# Patient Record
Sex: Male | Born: 1945 | Race: White | Hispanic: No | Marital: Married | Smoking: Never smoker
Health system: Southern US, Community
[De-identification: ages and names within clinical notes are randomized; demographics above are authoritative.]

## PROBLEM LIST (undated history)

## (undated) DIAGNOSIS — E119 Type 2 diabetes mellitus without complications: Secondary | ICD-10-CM

## (undated) DIAGNOSIS — I714 Abdominal aortic aneurysm, without rupture, unspecified: Secondary | ICD-10-CM

## (undated) DIAGNOSIS — K219 Gastro-esophageal reflux disease without esophagitis: Secondary | ICD-10-CM

## (undated) DIAGNOSIS — I2510 Atherosclerotic heart disease of native coronary artery without angina pectoris: Secondary | ICD-10-CM

## (undated) DIAGNOSIS — I5042 Chronic combined systolic (congestive) and diastolic (congestive) heart failure: Secondary | ICD-10-CM

## (undated) DIAGNOSIS — I350 Nonrheumatic aortic (valve) stenosis: Secondary | ICD-10-CM

## (undated) DIAGNOSIS — E785 Hyperlipidemia, unspecified: Secondary | ICD-10-CM

## (undated) DIAGNOSIS — I639 Cerebral infarction, unspecified: Secondary | ICD-10-CM

## (undated) DIAGNOSIS — I10 Essential (primary) hypertension: Secondary | ICD-10-CM

## (undated) DIAGNOSIS — D696 Thrombocytopenia, unspecified: Secondary | ICD-10-CM

## (undated) DIAGNOSIS — I119 Hypertensive heart disease without heart failure: Secondary | ICD-10-CM

## (undated) HISTORY — DX: Essential (primary) hypertension: I10

## (undated) HISTORY — DX: Hypertensive heart disease without heart failure: I11.9

## (undated) HISTORY — DX: Cerebral infarction, unspecified: I63.9

## (undated) HISTORY — DX: Nonrheumatic aortic (valve) stenosis: I35.0

## (undated) HISTORY — PX: TONSILLECTOMY: SUR1361

## (undated) HISTORY — PX: CARDIAC CATHETERIZATION: SHX172

## (undated) HISTORY — DX: Hyperlipidemia, unspecified: E78.5

## (undated) HISTORY — PX: MOLE REMOVAL: SHX2046

## (undated) HISTORY — DX: Type 2 diabetes mellitus without complications: E11.9

---

## 1997-09-02 ENCOUNTER — Inpatient Hospital Stay (HOSPITAL_COMMUNITY): Admission: EM | Admit: 1997-09-02 | Discharge: 1997-09-02 | Payer: Self-pay | Admitting: Emergency Medicine

## 1998-01-24 ENCOUNTER — Emergency Department (HOSPITAL_COMMUNITY): Admission: EM | Admit: 1998-01-24 | Discharge: 1998-01-24 | Payer: Self-pay | Admitting: Emergency Medicine

## 1998-06-01 ENCOUNTER — Ambulatory Visit (HOSPITAL_COMMUNITY): Admission: RE | Admit: 1998-06-01 | Discharge: 1998-06-01 | Payer: Self-pay | Admitting: Gastroenterology

## 1998-08-17 ENCOUNTER — Encounter: Payer: Self-pay | Admitting: Emergency Medicine

## 1998-08-17 ENCOUNTER — Emergency Department (HOSPITAL_COMMUNITY): Admission: EM | Admit: 1998-08-17 | Discharge: 1998-08-17 | Payer: Self-pay | Admitting: Emergency Medicine

## 1999-04-10 ENCOUNTER — Emergency Department (HOSPITAL_COMMUNITY): Admission: EM | Admit: 1999-04-10 | Discharge: 1999-04-10 | Payer: Self-pay | Admitting: Emergency Medicine

## 1999-12-12 ENCOUNTER — Ambulatory Visit (HOSPITAL_COMMUNITY): Admission: RE | Admit: 1999-12-12 | Discharge: 1999-12-12 | Payer: Self-pay | Admitting: Gastroenterology

## 2000-08-24 ENCOUNTER — Ambulatory Visit (HOSPITAL_COMMUNITY): Admission: RE | Admit: 2000-08-24 | Discharge: 2000-08-24 | Payer: Self-pay | Admitting: Gastroenterology

## 2000-08-24 ENCOUNTER — Encounter: Payer: Self-pay | Admitting: Gastroenterology

## 2000-09-30 ENCOUNTER — Ambulatory Visit (HOSPITAL_COMMUNITY): Admission: RE | Admit: 2000-09-30 | Discharge: 2000-09-30 | Payer: Self-pay | Admitting: Gastroenterology

## 2001-09-13 ENCOUNTER — Ambulatory Visit (HOSPITAL_COMMUNITY): Admission: RE | Admit: 2001-09-13 | Discharge: 2001-09-13 | Payer: Self-pay | Admitting: Gastroenterology

## 2005-06-28 ENCOUNTER — Observation Stay (HOSPITAL_COMMUNITY): Admission: EM | Admit: 2005-06-28 | Discharge: 2005-06-29 | Payer: Self-pay | Admitting: Emergency Medicine

## 2011-07-29 DIAGNOSIS — E785 Hyperlipidemia, unspecified: Secondary | ICD-10-CM | POA: Diagnosis not present

## 2011-07-29 DIAGNOSIS — E1159 Type 2 diabetes mellitus with other circulatory complications: Secondary | ICD-10-CM | POA: Diagnosis not present

## 2011-07-29 DIAGNOSIS — Z125 Encounter for screening for malignant neoplasm of prostate: Secondary | ICD-10-CM | POA: Diagnosis not present

## 2011-07-29 DIAGNOSIS — I10 Essential (primary) hypertension: Secondary | ICD-10-CM | POA: Diagnosis not present

## 2011-08-05 DIAGNOSIS — I2510 Atherosclerotic heart disease of native coronary artery without angina pectoris: Secondary | ICD-10-CM | POA: Diagnosis not present

## 2011-08-05 DIAGNOSIS — Z0000 Encounter for general adult medical examination without abnormal findings: Secondary | ICD-10-CM | POA: Diagnosis not present

## 2011-08-05 DIAGNOSIS — Z125 Encounter for screening for malignant neoplasm of prostate: Secondary | ICD-10-CM | POA: Diagnosis not present

## 2011-08-05 DIAGNOSIS — I10 Essential (primary) hypertension: Secondary | ICD-10-CM | POA: Diagnosis not present

## 2011-08-05 DIAGNOSIS — E1159 Type 2 diabetes mellitus with other circulatory complications: Secondary | ICD-10-CM | POA: Diagnosis not present

## 2011-08-07 DIAGNOSIS — Z1212 Encounter for screening for malignant neoplasm of rectum: Secondary | ICD-10-CM | POA: Diagnosis not present

## 2011-12-10 DIAGNOSIS — I10 Essential (primary) hypertension: Secondary | ICD-10-CM | POA: Diagnosis not present

## 2011-12-10 DIAGNOSIS — I2510 Atherosclerotic heart disease of native coronary artery without angina pectoris: Secondary | ICD-10-CM | POA: Diagnosis not present

## 2011-12-10 DIAGNOSIS — E785 Hyperlipidemia, unspecified: Secondary | ICD-10-CM | POA: Diagnosis not present

## 2011-12-10 DIAGNOSIS — E1159 Type 2 diabetes mellitus with other circulatory complications: Secondary | ICD-10-CM | POA: Diagnosis not present

## 2012-02-17 DIAGNOSIS — C44319 Basal cell carcinoma of skin of other parts of face: Secondary | ICD-10-CM | POA: Diagnosis not present

## 2012-02-17 DIAGNOSIS — D239 Other benign neoplasm of skin, unspecified: Secondary | ICD-10-CM | POA: Diagnosis not present

## 2012-02-17 DIAGNOSIS — L821 Other seborrheic keratosis: Secondary | ICD-10-CM | POA: Diagnosis not present

## 2012-02-17 DIAGNOSIS — C4441 Basal cell carcinoma of skin of scalp and neck: Secondary | ICD-10-CM | POA: Diagnosis not present

## 2012-02-17 DIAGNOSIS — L219 Seborrheic dermatitis, unspecified: Secondary | ICD-10-CM | POA: Diagnosis not present

## 2012-02-17 DIAGNOSIS — D485 Neoplasm of uncertain behavior of skin: Secondary | ICD-10-CM | POA: Diagnosis not present

## 2012-03-22 DIAGNOSIS — C44319 Basal cell carcinoma of skin of other parts of face: Secondary | ICD-10-CM | POA: Diagnosis not present

## 2012-04-05 DIAGNOSIS — C4441 Basal cell carcinoma of skin of scalp and neck: Secondary | ICD-10-CM | POA: Diagnosis not present

## 2012-04-05 DIAGNOSIS — C4491 Basal cell carcinoma of skin, unspecified: Secondary | ICD-10-CM | POA: Diagnosis not present

## 2012-08-02 DIAGNOSIS — I10 Essential (primary) hypertension: Secondary | ICD-10-CM | POA: Diagnosis not present

## 2012-08-02 DIAGNOSIS — E1159 Type 2 diabetes mellitus with other circulatory complications: Secondary | ICD-10-CM | POA: Diagnosis not present

## 2012-08-02 DIAGNOSIS — E785 Hyperlipidemia, unspecified: Secondary | ICD-10-CM | POA: Diagnosis not present

## 2012-08-02 DIAGNOSIS — Z125 Encounter for screening for malignant neoplasm of prostate: Secondary | ICD-10-CM | POA: Diagnosis not present

## 2012-08-09 DIAGNOSIS — I10 Essential (primary) hypertension: Secondary | ICD-10-CM | POA: Diagnosis not present

## 2012-08-09 DIAGNOSIS — I2510 Atherosclerotic heart disease of native coronary artery without angina pectoris: Secondary | ICD-10-CM | POA: Diagnosis not present

## 2012-08-09 DIAGNOSIS — E1159 Type 2 diabetes mellitus with other circulatory complications: Secondary | ICD-10-CM | POA: Diagnosis not present

## 2012-08-09 DIAGNOSIS — E785 Hyperlipidemia, unspecified: Secondary | ICD-10-CM | POA: Diagnosis not present

## 2012-08-09 DIAGNOSIS — M1990 Unspecified osteoarthritis, unspecified site: Secondary | ICD-10-CM | POA: Diagnosis not present

## 2012-08-09 DIAGNOSIS — Z125 Encounter for screening for malignant neoplasm of prostate: Secondary | ICD-10-CM | POA: Diagnosis not present

## 2012-08-09 DIAGNOSIS — Z0000 Encounter for general adult medical examination without abnormal findings: Secondary | ICD-10-CM | POA: Diagnosis not present

## 2012-08-11 DIAGNOSIS — Z1212 Encounter for screening for malignant neoplasm of rectum: Secondary | ICD-10-CM | POA: Diagnosis not present

## 2013-02-07 DIAGNOSIS — I10 Essential (primary) hypertension: Secondary | ICD-10-CM | POA: Diagnosis not present

## 2013-02-07 DIAGNOSIS — M1990 Unspecified osteoarthritis, unspecified site: Secondary | ICD-10-CM | POA: Diagnosis not present

## 2013-02-07 DIAGNOSIS — Z23 Encounter for immunization: Secondary | ICD-10-CM | POA: Diagnosis not present

## 2013-02-07 DIAGNOSIS — Z1331 Encounter for screening for depression: Secondary | ICD-10-CM | POA: Diagnosis not present

## 2013-02-07 DIAGNOSIS — E785 Hyperlipidemia, unspecified: Secondary | ICD-10-CM | POA: Diagnosis not present

## 2013-02-07 DIAGNOSIS — I2510 Atherosclerotic heart disease of native coronary artery without angina pectoris: Secondary | ICD-10-CM | POA: Diagnosis not present

## 2013-02-07 DIAGNOSIS — Z6841 Body Mass Index (BMI) 40.0 and over, adult: Secondary | ICD-10-CM | POA: Diagnosis not present

## 2013-02-07 DIAGNOSIS — E1159 Type 2 diabetes mellitus with other circulatory complications: Secondary | ICD-10-CM | POA: Diagnosis not present

## 2013-05-09 ENCOUNTER — Other Ambulatory Visit: Payer: Self-pay | Admitting: Dermatology

## 2013-05-09 DIAGNOSIS — L851 Acquired keratosis [keratoderma] palmaris et plantaris: Secondary | ICD-10-CM | POA: Diagnosis not present

## 2013-05-09 DIAGNOSIS — D239 Other benign neoplasm of skin, unspecified: Secondary | ICD-10-CM | POA: Diagnosis not present

## 2013-05-09 DIAGNOSIS — L219 Seborrheic dermatitis, unspecified: Secondary | ICD-10-CM | POA: Diagnosis not present

## 2013-05-09 DIAGNOSIS — D485 Neoplasm of uncertain behavior of skin: Secondary | ICD-10-CM | POA: Diagnosis not present

## 2013-05-09 DIAGNOSIS — L570 Actinic keratosis: Secondary | ICD-10-CM | POA: Diagnosis not present

## 2013-05-09 DIAGNOSIS — Z85828 Personal history of other malignant neoplasm of skin: Secondary | ICD-10-CM | POA: Diagnosis not present

## 2013-10-10 DIAGNOSIS — I10 Essential (primary) hypertension: Secondary | ICD-10-CM | POA: Diagnosis not present

## 2013-10-10 DIAGNOSIS — Z125 Encounter for screening for malignant neoplasm of prostate: Secondary | ICD-10-CM | POA: Diagnosis not present

## 2013-10-10 DIAGNOSIS — E785 Hyperlipidemia, unspecified: Secondary | ICD-10-CM | POA: Diagnosis not present

## 2013-10-10 DIAGNOSIS — E119 Type 2 diabetes mellitus without complications: Secondary | ICD-10-CM | POA: Diagnosis not present

## 2013-10-17 DIAGNOSIS — I10 Essential (primary) hypertension: Secondary | ICD-10-CM | POA: Diagnosis not present

## 2013-10-17 DIAGNOSIS — E785 Hyperlipidemia, unspecified: Secondary | ICD-10-CM | POA: Diagnosis not present

## 2013-10-17 DIAGNOSIS — M1990 Unspecified osteoarthritis, unspecified site: Secondary | ICD-10-CM | POA: Diagnosis not present

## 2013-10-17 DIAGNOSIS — I2510 Atherosclerotic heart disease of native coronary artery without angina pectoris: Secondary | ICD-10-CM | POA: Diagnosis not present

## 2013-10-17 DIAGNOSIS — Z125 Encounter for screening for malignant neoplasm of prostate: Secondary | ICD-10-CM | POA: Diagnosis not present

## 2013-10-17 DIAGNOSIS — E1159 Type 2 diabetes mellitus with other circulatory complications: Secondary | ICD-10-CM | POA: Diagnosis not present

## 2013-10-17 DIAGNOSIS — K7689 Other specified diseases of liver: Secondary | ICD-10-CM | POA: Diagnosis not present

## 2013-10-17 DIAGNOSIS — Z0000 Encounter for general adult medical examination without abnormal findings: Secondary | ICD-10-CM | POA: Diagnosis not present

## 2013-10-18 DIAGNOSIS — Z1212 Encounter for screening for malignant neoplasm of rectum: Secondary | ICD-10-CM | POA: Diagnosis not present

## 2014-01-26 DIAGNOSIS — L678 Other hair color and hair shaft abnormalities: Secondary | ICD-10-CM | POA: Diagnosis not present

## 2014-01-26 DIAGNOSIS — Z6841 Body Mass Index (BMI) 40.0 and over, adult: Secondary | ICD-10-CM | POA: Diagnosis not present

## 2014-01-26 DIAGNOSIS — L738 Other specified follicular disorders: Secondary | ICD-10-CM | POA: Diagnosis not present

## 2014-01-26 DIAGNOSIS — I10 Essential (primary) hypertension: Secondary | ICD-10-CM | POA: Diagnosis not present

## 2014-01-26 DIAGNOSIS — E1159 Type 2 diabetes mellitus with other circulatory complications: Secondary | ICD-10-CM | POA: Diagnosis not present

## 2014-03-03 DIAGNOSIS — Z23 Encounter for immunization: Secondary | ICD-10-CM | POA: Diagnosis not present

## 2014-05-10 ENCOUNTER — Other Ambulatory Visit: Payer: Self-pay | Admitting: Dermatology

## 2014-05-10 DIAGNOSIS — D485 Neoplasm of uncertain behavior of skin: Secondary | ICD-10-CM | POA: Diagnosis not present

## 2014-05-10 DIAGNOSIS — C4441 Basal cell carcinoma of skin of scalp and neck: Secondary | ICD-10-CM | POA: Diagnosis not present

## 2014-05-10 DIAGNOSIS — Z85828 Personal history of other malignant neoplasm of skin: Secondary | ICD-10-CM | POA: Diagnosis not present

## 2014-05-10 DIAGNOSIS — D229 Melanocytic nevi, unspecified: Secondary | ICD-10-CM | POA: Diagnosis not present

## 2014-05-10 DIAGNOSIS — L570 Actinic keratosis: Secondary | ICD-10-CM | POA: Diagnosis not present

## 2014-05-10 DIAGNOSIS — L821 Other seborrheic keratosis: Secondary | ICD-10-CM | POA: Diagnosis not present

## 2014-05-10 DIAGNOSIS — L723 Sebaceous cyst: Secondary | ICD-10-CM | POA: Diagnosis not present

## 2014-05-10 DIAGNOSIS — Z86018 Personal history of other benign neoplasm: Secondary | ICD-10-CM | POA: Diagnosis not present

## 2014-05-15 DIAGNOSIS — E785 Hyperlipidemia, unspecified: Secondary | ICD-10-CM | POA: Diagnosis not present

## 2014-05-15 DIAGNOSIS — Z6841 Body Mass Index (BMI) 40.0 and over, adult: Secondary | ICD-10-CM | POA: Diagnosis not present

## 2014-05-15 DIAGNOSIS — Z1389 Encounter for screening for other disorder: Secondary | ICD-10-CM | POA: Diagnosis not present

## 2014-05-15 DIAGNOSIS — E1151 Type 2 diabetes mellitus with diabetic peripheral angiopathy without gangrene: Secondary | ICD-10-CM | POA: Diagnosis not present

## 2014-05-15 DIAGNOSIS — K760 Fatty (change of) liver, not elsewhere classified: Secondary | ICD-10-CM | POA: Diagnosis not present

## 2014-05-15 DIAGNOSIS — I10 Essential (primary) hypertension: Secondary | ICD-10-CM | POA: Diagnosis not present

## 2014-06-21 DIAGNOSIS — C4441 Basal cell carcinoma of skin of scalp and neck: Secondary | ICD-10-CM | POA: Diagnosis not present

## 2014-11-01 DIAGNOSIS — E785 Hyperlipidemia, unspecified: Secondary | ICD-10-CM | POA: Diagnosis not present

## 2014-11-01 DIAGNOSIS — E1151 Type 2 diabetes mellitus with diabetic peripheral angiopathy without gangrene: Secondary | ICD-10-CM | POA: Diagnosis not present

## 2014-11-01 DIAGNOSIS — I10 Essential (primary) hypertension: Secondary | ICD-10-CM | POA: Diagnosis not present

## 2014-11-01 DIAGNOSIS — Z125 Encounter for screening for malignant neoplasm of prostate: Secondary | ICD-10-CM | POA: Diagnosis not present

## 2014-11-08 DIAGNOSIS — I10 Essential (primary) hypertension: Secondary | ICD-10-CM | POA: Diagnosis not present

## 2014-11-08 DIAGNOSIS — E1151 Type 2 diabetes mellitus with diabetic peripheral angiopathy without gangrene: Secondary | ICD-10-CM | POA: Diagnosis not present

## 2014-11-08 DIAGNOSIS — M1990 Unspecified osteoarthritis, unspecified site: Secondary | ICD-10-CM | POA: Diagnosis not present

## 2014-11-08 DIAGNOSIS — Z6839 Body mass index (BMI) 39.0-39.9, adult: Secondary | ICD-10-CM | POA: Diagnosis not present

## 2014-11-08 DIAGNOSIS — I2510 Atherosclerotic heart disease of native coronary artery without angina pectoris: Secondary | ICD-10-CM | POA: Diagnosis not present

## 2014-11-08 DIAGNOSIS — E785 Hyperlipidemia, unspecified: Secondary | ICD-10-CM | POA: Diagnosis not present

## 2014-11-08 DIAGNOSIS — Z0000 Encounter for general adult medical examination without abnormal findings: Secondary | ICD-10-CM | POA: Diagnosis not present

## 2014-11-09 DIAGNOSIS — Z1212 Encounter for screening for malignant neoplasm of rectum: Secondary | ICD-10-CM | POA: Diagnosis not present

## 2014-12-20 DIAGNOSIS — C4441 Basal cell carcinoma of skin of scalp and neck: Secondary | ICD-10-CM | POA: Diagnosis not present

## 2014-12-20 DIAGNOSIS — D485 Neoplasm of uncertain behavior of skin: Secondary | ICD-10-CM | POA: Diagnosis not present

## 2014-12-20 DIAGNOSIS — C44311 Basal cell carcinoma of skin of nose: Secondary | ICD-10-CM | POA: Diagnosis not present

## 2014-12-20 DIAGNOSIS — L570 Actinic keratosis: Secondary | ICD-10-CM | POA: Diagnosis not present

## 2015-03-12 DIAGNOSIS — E1151 Type 2 diabetes mellitus with diabetic peripheral angiopathy without gangrene: Secondary | ICD-10-CM | POA: Diagnosis not present

## 2015-03-13 DIAGNOSIS — E119 Type 2 diabetes mellitus without complications: Secondary | ICD-10-CM | POA: Diagnosis not present

## 2015-03-21 DIAGNOSIS — I10 Essential (primary) hypertension: Secondary | ICD-10-CM | POA: Diagnosis not present

## 2015-03-21 DIAGNOSIS — Z6839 Body mass index (BMI) 39.0-39.9, adult: Secondary | ICD-10-CM | POA: Diagnosis not present

## 2015-03-21 DIAGNOSIS — M1990 Unspecified osteoarthritis, unspecified site: Secondary | ICD-10-CM | POA: Diagnosis not present

## 2015-03-21 DIAGNOSIS — Z23 Encounter for immunization: Secondary | ICD-10-CM | POA: Diagnosis not present

## 2015-03-21 DIAGNOSIS — N401 Enlarged prostate with lower urinary tract symptoms: Secondary | ICD-10-CM | POA: Diagnosis not present

## 2015-03-21 DIAGNOSIS — E1151 Type 2 diabetes mellitus with diabetic peripheral angiopathy without gangrene: Secondary | ICD-10-CM | POA: Diagnosis not present

## 2015-03-21 DIAGNOSIS — E784 Other hyperlipidemia: Secondary | ICD-10-CM | POA: Diagnosis not present

## 2015-03-21 DIAGNOSIS — I2510 Atherosclerotic heart disease of native coronary artery without angina pectoris: Secondary | ICD-10-CM | POA: Diagnosis not present

## 2015-08-06 DIAGNOSIS — I63233 Cerebral infarction due to unspecified occlusion or stenosis of bilateral carotid arteries: Secondary | ICD-10-CM | POA: Diagnosis not present

## 2015-08-06 DIAGNOSIS — R1110 Vomiting, unspecified: Secondary | ICD-10-CM | POA: Diagnosis not present

## 2015-08-06 DIAGNOSIS — R000 Tachycardia, unspecified: Secondary | ICD-10-CM | POA: Diagnosis not present

## 2015-08-06 DIAGNOSIS — I63411 Cerebral infarction due to embolism of right middle cerebral artery: Secondary | ICD-10-CM | POA: Diagnosis not present

## 2015-08-06 DIAGNOSIS — R531 Weakness: Secondary | ICD-10-CM | POA: Diagnosis not present

## 2015-08-06 DIAGNOSIS — H538 Other visual disturbances: Secondary | ICD-10-CM | POA: Diagnosis not present

## 2015-08-06 DIAGNOSIS — R112 Nausea with vomiting, unspecified: Secondary | ICD-10-CM | POA: Diagnosis not present

## 2015-08-06 DIAGNOSIS — I639 Cerebral infarction, unspecified: Secondary | ICD-10-CM | POA: Diagnosis not present

## 2015-08-06 DIAGNOSIS — R42 Dizziness and giddiness: Secondary | ICD-10-CM | POA: Diagnosis not present

## 2015-08-07 DIAGNOSIS — I63411 Cerebral infarction due to embolism of right middle cerebral artery: Secondary | ICD-10-CM | POA: Diagnosis not present

## 2015-08-08 DIAGNOSIS — I350 Nonrheumatic aortic (valve) stenosis: Secondary | ICD-10-CM | POA: Diagnosis not present

## 2015-08-08 DIAGNOSIS — I63411 Cerebral infarction due to embolism of right middle cerebral artery: Secondary | ICD-10-CM | POA: Diagnosis not present

## 2015-08-08 DIAGNOSIS — I517 Cardiomegaly: Secondary | ICD-10-CM | POA: Diagnosis not present

## 2015-08-09 ENCOUNTER — Inpatient Hospital Stay (HOSPITAL_COMMUNITY)
Admission: AD | Admit: 2015-08-09 | Discharge: 2015-08-17 | DRG: 057 | Disposition: A | Discharge status: Home / Self Care | Payer: Medicare Other | Source: Other Acute Inpatient Hospital | Attending: Physical Medicine & Rehabilitation | Admitting: Physical Medicine & Rehabilitation

## 2015-08-09 ENCOUNTER — Other Ambulatory Visit (HOSPITAL_COMMUNITY): Payer: Self-pay | Admitting: Physician Assistant

## 2015-08-09 ENCOUNTER — Encounter: Payer: Self-pay | Admitting: *Deleted

## 2015-08-09 DIAGNOSIS — I2510 Atherosclerotic heart disease of native coronary artery without angina pectoris: Secondary | ICD-10-CM | POA: Diagnosis present

## 2015-08-09 DIAGNOSIS — I69322 Dysarthria following cerebral infarction: Secondary | ICD-10-CM | POA: Diagnosis not present

## 2015-08-09 DIAGNOSIS — E785 Hyperlipidemia, unspecified: Secondary | ICD-10-CM | POA: Diagnosis present

## 2015-08-09 DIAGNOSIS — I639 Cerebral infarction, unspecified: Secondary | ICD-10-CM

## 2015-08-09 DIAGNOSIS — I69393 Ataxia following cerebral infarction: Principal | ICD-10-CM

## 2015-08-09 DIAGNOSIS — E876 Hypokalemia: Secondary | ICD-10-CM | POA: Diagnosis present

## 2015-08-09 DIAGNOSIS — I69398 Other sequelae of cerebral infarction: Secondary | ICD-10-CM

## 2015-08-09 DIAGNOSIS — Z8673 Personal history of transient ischemic attack (TIA), and cerebral infarction without residual deficits: Secondary | ICD-10-CM | POA: Diagnosis present

## 2015-08-09 DIAGNOSIS — F1010 Alcohol abuse, uncomplicated: Secondary | ICD-10-CM | POA: Diagnosis present

## 2015-08-09 DIAGNOSIS — I10 Essential (primary) hypertension: Secondary | ICD-10-CM | POA: Diagnosis present

## 2015-08-09 DIAGNOSIS — E1142 Type 2 diabetes mellitus with diabetic polyneuropathy: Secondary | ICD-10-CM | POA: Diagnosis present

## 2015-08-09 DIAGNOSIS — I63349 Cerebral infarction due to thrombosis of unspecified cerebellar artery: Secondary | ICD-10-CM | POA: Diagnosis not present

## 2015-08-09 DIAGNOSIS — I63011 Cerebral infarction due to thrombosis of right vertebral artery: Secondary | ICD-10-CM | POA: Diagnosis not present

## 2015-08-09 DIAGNOSIS — R269 Unspecified abnormalities of gait and mobility: Secondary | ICD-10-CM

## 2015-08-09 DIAGNOSIS — R531 Weakness: Secondary | ICD-10-CM | POA: Diagnosis present

## 2015-08-09 DIAGNOSIS — R270 Ataxia, unspecified: Secondary | ICD-10-CM | POA: Diagnosis not present

## 2015-08-09 DIAGNOSIS — F17200 Nicotine dependence, unspecified, uncomplicated: Secondary | ICD-10-CM | POA: Diagnosis present

## 2015-08-09 DIAGNOSIS — I63339 Cerebral infarction due to thrombosis of unspecified posterior cerebral artery: Secondary | ICD-10-CM

## 2015-08-09 LAB — CBC
HCT: 45.6 % (ref 39.0–52.0)
Hemoglobin: 16 g/dL (ref 13.0–17.0)
MCH: 34 pg (ref 26.0–34.0)
MCHC: 35.1 g/dL (ref 30.0–36.0)
MCV: 96.8 fL (ref 78.0–100.0)
Platelets: 90 10*3/uL — ABNORMAL LOW (ref 150–400)
RBC: 4.71 MIL/uL (ref 4.22–5.81)
RDW: 12.9 % (ref 11.5–15.5)
WBC: 3.2 10*3/uL — AB (ref 4.0–10.5)

## 2015-08-09 LAB — GLUCOSE, CAPILLARY
GLUCOSE-CAPILLARY: 205 mg/dL — AB (ref 65–99)
Glucose-Capillary: 159 mg/dL — ABNORMAL HIGH (ref 65–99)

## 2015-08-09 LAB — CREATININE, SERUM
Creatinine, Ser: 0.87 mg/dL (ref 0.61–1.24)
GFR calc Af Amer: >60 mL/min (ref >60)
GFR calc non Af Amer: >60 mL/min (ref >60)

## 2015-08-09 MED ORDER — PANTOPRAZOLE SODIUM 40 MG PO TBEC
40.0000 mg | DELAYED_RELEASE_TABLET | Freq: Every day | ORAL | Status: DC
  Administered 2015-08-09 – 2015-08-17 (×9): 40 mg via ORAL
  Filled 2015-08-09 (×9): qty 1

## 2015-08-09 MED ORDER — ONDANSETRON HCL 4 MG/2ML IJ SOLN: 4.0000 mg | Freq: Four times a day (QID) | INTRAMUSCULAR | Status: DC | PRN

## 2015-08-09 MED ORDER — FOLIC ACID 1 MG PO TABS
1.0000 mg | ORAL_TABLET | Freq: Every day | ORAL | Status: DC
  Administered 2015-08-09 – 2015-08-17 (×9): 1 mg via ORAL
  Filled 2015-08-09 (×9): qty 1

## 2015-08-09 MED ORDER — ACETAMINOPHEN 325 MG PO TABS: 325.0000 mg | ORAL_TABLET | ORAL | Status: DC | PRN

## 2015-08-09 MED ORDER — CLOPIDOGREL BISULFATE 75 MG PO TABS
75.0000 mg | ORAL_TABLET | Freq: Every day | ORAL | Status: DC
  Administered 2015-08-09 – 2015-08-17 (×9): 75 mg via ORAL
  Filled 2015-08-09 (×9): qty 1

## 2015-08-09 MED ORDER — DILTIAZEM HCL ER COATED BEADS 180 MG PO CP24
360.0000 mg | ORAL_CAPSULE | Freq: Every day | ORAL | Status: DC
  Administered 2015-08-10 – 2015-08-17 (×8): 360 mg via ORAL
  Filled 2015-08-09 (×9): qty 2

## 2015-08-09 MED ORDER — INSULIN ASPART 100 UNIT/ML ~~LOC~~ SOLN
0.0000 [IU] | SUBCUTANEOUS | Status: DC
  Administered 2015-08-09: 8 [IU] via SUBCUTANEOUS
  Administered 2015-08-10 (×2): 4 [IU] via SUBCUTANEOUS
  Administered 2015-08-10: 2 [IU] via SUBCUTANEOUS
  Administered 2015-08-10: 4 [IU] via SUBCUTANEOUS
  Administered 2015-08-11: 2 [IU] via SUBCUTANEOUS
  Administered 2015-08-11: 4 [IU] via SUBCUTANEOUS
  Administered 2015-08-11 (×3): 2 [IU] via SUBCUTANEOUS
  Administered 2015-08-11 – 2015-08-12 (×3): 4 [IU] via SUBCUTANEOUS
  Administered 2015-08-12: 2 [IU] via SUBCUTANEOUS

## 2015-08-09 MED ORDER — ATORVASTATIN CALCIUM 40 MG PO TABS
40.0000 mg | ORAL_TABLET | Freq: Every day | ORAL | Status: DC
  Administered 2015-08-09 – 2015-08-16 (×8): 40 mg via ORAL
  Filled 2015-08-09 (×8): qty 1

## 2015-08-09 MED ORDER — ASPIRIN 81 MG PO CHEW
81.0000 mg | CHEWABLE_TABLET | Freq: Every day | ORAL | Status: DC
  Administered 2015-08-09 – 2015-08-17 (×9): 81 mg via ORAL
  Filled 2015-08-09 (×9): qty 1

## 2015-08-09 MED ORDER — HEPARIN SODIUM (PORCINE) 5000 UNIT/ML IJ SOLN
5000.0000 [IU] | Freq: Three times a day (TID) | INTRAMUSCULAR | Status: DC
  Administered 2015-08-09 – 2015-08-10 (×2): 5000 [IU] via SUBCUTANEOUS
  Filled 2015-08-09 (×2): qty 1

## 2015-08-09 MED ORDER — SORBITOL 70 % SOLN: 30.0000 mL | Freq: Every day | Status: DC | PRN

## 2015-08-09 MED ORDER — VITAMIN B-12 100 MCG PO TABS
100.0000 ug | ORAL_TABLET | Freq: Every day | ORAL | Status: DC
  Administered 2015-08-09 – 2015-08-17 (×9): 100 ug via ORAL
  Filled 2015-08-09 (×10): qty 1

## 2015-08-09 MED ORDER — INSULIN DETEMIR 100 UNIT/ML ~~LOC~~ SOLN
18.0000 [IU] | Freq: Two times a day (BID) | SUBCUTANEOUS | Status: DC
Start: 2015-08-09 — End: 2015-08-12
  Administered 2015-08-09 – 2015-08-12 (×6): 18 [IU] via SUBCUTANEOUS
  Filled 2015-08-09 (×9): qty 0.18

## 2015-08-09 MED ORDER — ONDANSETRON HCL 4 MG PO TABS: 4.0000 mg | ORAL_TABLET | Freq: Four times a day (QID) | ORAL | Status: DC | PRN

## 2015-08-09 NOTE — Progress Notes (Signed)
Rehab Admission Coordinator Shared Physical Medicine and Rehabilitation PMR Pre-admission 08/09/2015 1:31 PM  Related encounter: Documentation from 08/09/2015 in [place] [facility]     Secondary Market PMR Admission Coordinator Pre-Admission Assessment  Patient: Jose Beck is an 70 y.o., male MRN: [id number] DOB: [birth year] Height: 5' 10.08" (178 cm) Weight: 125.193 kg (276 lb)  Insurance Information HMO: No PPO: PCP: IPA: 80/20: OTHER:  PRIMARY: Medicare A/B Policy#: [id number] Subscriber: Jonetta Speak CM Name: Phone#: Fax#:  Pre-Cert#: Employer: Retired Benefits: Phone #: Name: Checked in [place]. Date: 03-13-11 Deduct: $1316 Out of Pocket Max: none Life Max: unlimited CIR: 100% SNF: 100 days Outpatient: 80% Co-Pay: 20% Home Health: 100% Co-Pay: none DME: 80% Co-Pay: 20% Providers: patient's choice  SECONDARY: AARP Policy#: [id number] Subscriber: Laban Emperor CM Name: Phone#: Fax#:  Pre-Cert#: Employer: Retired Benefits: Phone #: [phone] Name:  Irene Shipper. 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 Peter Martinique, TIA, hyperlipidemia, diabetes mellitus, alcohol abuse. Patient visiting [place] on vacation. [facility]/2015 with dizziness and slurred speech after being found down in the bathroom. Denied any chest pain. Noted blood pressure 238/130. EKG normal sinus rhythm. CT MRI imaging showed subacute infarct superior right cerebellar hemisphere with small acute infarct contralateral superior left cerebellar hemisphere. Occluded intracranial right vertebral artery. Chest x-ray negative. CT angiogram of head and neck showed ulcerated atherosclerotic plaque proximal aspect right internal carotid artery with mild stenosis on the order of 15-20%. Also plaque noted left vertebral artery with stenosis 30%. Severe vertebrobasilar stenosis and occlusions. Mild hypokalemia 3.3. TEE completed showing left ventricle mildly enlarged based on the left ventricular end-diastolic volume. LVEF of 40%. Global hypokinesis. Neurology consulted placed on low-dose aspirin and Plavix. Subcutaneous heparin for DVT prophylaxis. Close monitoring of blood pressure with permissive hypertension. Physical/Occupational therapy continue to follow patient noting mod assist for mobility and activities daily living. Patient to be admitted for comprehensive inpatient rehabilitation program.  Patient's medical record from [facility] has been reviewed by the rehabilitation admission coordinator and physician.  NIH Stroke scale: 3-4  Past Medical History  HTN, CAD, TIA, Hyperlipidemia, Alcohol use, tobacco use  Family History  family history is not on file.  Prior Rehab/Hospitalizations Has the patient had major surgery during 100 days prior to admission? No   Current Medications See MAR from [facility]  Patients Current Diet: Regular diet, thin liquids  Precautions / Restrictions Precautions Precautions: Fall   Has the patient had 2 or more falls or a fall with injury in the past year?No  Prior Activity Level Community (5-7x/wk): Went out daily.  Traveled with wife. Was independent and driving.  Prior Functional Level Self Care: Did the patient need help bathing, dressing, using the toilet or eating? Independent  Indoor Mobility: Did the patient need assistance with walking from room to room (with or without device)? Independent  Stairs: Did the patient need assistance with internal or external stairs (with or without  device)? Independent  Functional Cognition: Did the patient need help planning regular tasks such as shopping or remembering to take medications? Independent  Home Assistive Devices / Equipment Home Assistive Devices/Equipment: None  Prior Device Use: Indicate devices/aids used by the patient prior to current illness, exacerbation or injury? None   Prior Functional Level Current Functional Level  Bed Mobility  Independent  Min assist   Transfers  Independent  Mod assist   Mobility - Walk/Wheelchair  Independent  Mod assist   Upper Body Dressing  Independent  Min assist   Lower Body Dressing  Independent  Mod assist   Grooming  Independent  Min assist   Eating/Drinking  Independent  Min assist   Toilet Transfer  Independent  Mod assist   Bladder Continence   WDL  WDL   Bowel Management  WDL  WDL   Stair Climbing  Independent  Total assist   Communication  Intact  Slurred speech   Memory  Intact  Intact   Cooking/Meal Prep  Wife cooks or they go out to eat     Housework  Wife does housework    Money Management  Independent    Driving  Yes, driving     Special needs/care consideration BiPAP/CPAP No CPM No Continuous Drip IV No Dialysis No  Life Vest No Oxygen No Special Bed No Trach Size No Wound Vac (area) No  Skin No  Bowel mgmt: WDL Bladder mgmt:WDL Diabetic mgmt Yes, on insulin in hospital most recently  Previous Home Environment Living  Arrangements: Spouse/significant other (Lives with wife.) Lives With: Spouse Available Help at Discharge: Family, Available 24 hours/day Type of Home: House Home Layout: One level Home Access: Stairs to enter Technical brewer of Steps: 2 steps  Discharge Living Setting Plans for Discharge Living Setting: Patient's home, House, Lives with (comment) (Lives with wife.) Type of Home at Discharge: House Discharge Home Layout: One level Discharge Home Access: Stairs to enter Entrance Stairs-Number of Steps: 2 steps Does the patient have any problems obtaining your medications?: No  Social/Family/Support Systems Patient Roles: Spouse Contact Information: Leonhard Dowie - wife Anticipated Caregiver: wife Anticipated Caregiver's Contact Information: [name] - wife - [phone] Ability/Limitations of Caregiver: Wife can assist. Caregiver Availability: 24/7 Discharge Plan Discussed with Primary Caregiver: No Is Caregiver In Agreement with Plan?: Yes Does Caregiver/Family have Issues with Lodging/Transportation while Pt is in Rehab?: No  Goals/Additional Needs Patient/Family Goal for Rehab: PT/OT mod I and supervision goals, ST mod I and I goals Expected length of stay: 7-10 days Cultural Considerations: None Dietary Needs: Regular diet, thin liquids Equipment Needs: TBD Pt/Family Agrees to Admission and willing to participate: Yes Program Orientation Provided & Reviewed with Pt/Caregiver Including Roles & Responsibilities: Yes  Patient Condition: Patient has suffered a L cerebellar infarct. He has been receiving PT/OT/ST while at [facility] in [place]. He is requiring min/mod assist with mobility and ADLs. He is dysarthric, ataxic, and has mild right side weakness. He can benefit from acute inpatient rehab admission with a coordinated approach to his rehab care. He can tolerate 3 hours of therapy a day. He has a very supportive wife. I have reviewed all  information with rehab MD and have approval for acute inpatient rehab admission today.  Preadmission Screen Completed By: Retta Diones, 08/09/2015 3:36 PM ______________________________________________________________________  Discussed status with Dr. Letta Pate on 08/09/15 at 1541 and received telephone approval for admission today.  Admission Coordinator: Retta Diones, time 345pm/Date 08/09/2015   Assessment/Plan: Diagnosis: 1.  Does the need for close, 24 hr/day Medical supervision in concert with the patient's rehab needs make it unreasonable for this patient to be served in a less intensive setting? Yes 2. Co-Morbidities requiring supervision/potential complications: CAD,DM, ETOH hx 3. Due to bladder management, bowel management, safety, disease management, medication administration and patient education, does the patient require 24 hr/day rehab nursing? Yes 4. Does the patient require coordinated care of a physician, rehab nurse, PT (1-2 hrs/day, 5 days/week) and OT (1-2 hrs/day, 5 days/week) to address physical and functional deficits in the context of the above medical diagnosis(es)? Yes Addressing deficits in the following areas: balance, endurance, locomotion, strength, transferring, bowel/bladder control, bathing, dressing, feeding, grooming and toileting 5. Can the patient actively participate in an intensive therapy program of at least 3 hrs of therapy 5 days a week? Yes 6. The potential for patient to make measurable gains while on inpatient rehab is excellent 7. Anticipated functional outcomes upon discharge from inpatients are: modified independent and supervision PT, modified independent and supervision OT, modified independent and supervision SLP 8. Estimated rehab length of stay to reach the above functional goals is: 7-10d 9. Does the patient have adequate social supports to accommodate these discharge functional goals? Yes 10. Anticipated D/C setting:  Home 11. Anticipated post D/C treatments: [facility] therapy 12. Overall Rehab/Functional Prognosis: excellent    RECOMMENDATIONS: This patient's condition is appropriate for continued rehabilitative care in the following setting: CIR Patient has agreed to participate in recommended program. Yes Note that insurance prior authorization may be required for reimbursement for recommended care.  Comment:  Retta Diones 08/09/2015

## 2015-08-09 NOTE — H&P (Unsigned)
Physical Medicine and Rehabilitation Admission H&P    Chief complaint: Weakness  HPI: 70 year old right handed male resident of [place] with history of hypertension, CAD followed by Dr. Peter Martinique, TIA, hyperlipidemia, diabetes mellitus, alcohol abuse. Patient visiting [place] on vacation. Presented to [facility] 08/06/2015 with dizziness and slurred speech after being found down in the bathroom. Denied any chest pain. Noted blood pressure 238/130. EKG normal sinus rhythm. CT MRI imaging showed subacute infarct superior right cerebellar hemisphere with small acute infarct contralateral superior left cerebellar hemisphere. Occluded intracranial right vertebral artery. Chest x-ray negative. CT angiogram of head and neck showed ulcerated atherosclerotic plaque proximal aspect right internal carotid artery with mild stenosis on the order of 15-20%. Also plaque noted left vertebral artery with stenosis 30%. Severe vertebrobasilar stenosis and occlusions. Mild hypokalemia 3.3. TEE completed showing left ventricle mildly enlarged based on the left ventricular end-diastolic volume. LVEF of 40%. Global hypokinesis. Neurology consulted placed on low-dose aspirin and Plavix. Subcutaneous heparin for DVT prophylaxis. Close monitoring of blood pressure with permissive hypertension. Physical occupational therapy continue to follow patient noting mod assist for mobility and activities daily living. Patient was admitted for comprehensive rehabilitation program  Review of Systems  Constitutional: Negative for fever and chills.  HENT: Negative for hearing loss.   Eyes: Positive for double vision. Negative for blurred vision.  Respiratory: Negative for cough and shortness of breath.   Cardiovascular: Positive for palpitations and leg swelling. Negative for chest pain.  Gastrointestinal: Positive for nausea, vomiting and constipation.  Genitourinary: Negative for dysuria and  hematuria.  Musculoskeletal: Positive for myalgias.  Skin: Negative for rash.  Neurological: Positive for speech change and weakness. Negative for seizures, loss of consciousness and headaches.  All other systems reviewed and are negative.     Social History: History of alcohol use. Allergies: NKA   Home:  patient lives with wife. Independent prior to admission. One level home with 2 steps to entry. Patient was still driving prior to admission.   Functional History:  independent prior to admission.  Functional Status:  Mobility: Sit to stand with rolling walker min mod assist. Ambulated with rolling walker mod max assist for distances. Very slow step length. Mid assist to set bed.          ADL:  min mod assist.  Cognition:  dysarthric speech. Some decreased safety awareness    Physical Exam: There were no vitals taken for this visit. Physical Exam  Constitutional: He is oriented to person, place, and time. He appears well-developed.  HENT:  Head: Normocephalic.  Eyes: EOM are normal.  Neck: Normal range of motion. Neck supple.  Cardiovascular: Normal rate and regular rhythm.   Respiratory: Effort normal and breath sounds normal. No respiratory distress.  GI: Soft. Bowel sounds are normal. He exhibits distension.  Neurological: He is alert and oriented to person, place, and time.  Follow simple commands. Speech is dysarthric but intelligible. Fair but limited awareness of deficits.  Skin: Skin is warm and dry.   138/77 pulse 70 respirations 18 temperature 98.6       Medical Problem List and Plan: 1.  Dizziness and slurred speech secondary to superior right cerebellar with small acute infarct contralateral superior left cerebellar hemisphere 2.  DVT Prophylaxis/Anticoagulation: Subcutaneous heparin. Monitor platelet counts and any signs of bleeding 3. Pain Management: Tylenol as needed 4. CAD/hypertension. Cardizem 360 mg daily. Monitor with increased  mobility 5. Neuropsych: This patient is capable of making decisions on  his own behalf. 6. Skin/Wound Care: Routine skin checks 7. Fluids/Electrolytes/Nutrition: Routine I&O with follow-up chemistries 8. Diabetes mellitus with peripheral neuropathy. Hemoglobin A1c 8.2. Levemir 18 units twice daily. Check blood sugars before meals and at bedtime 9. Alcohol abuse. Monitor for any signs of withdrawal 10. Hyperlipidemia. Zocor   Post Admission Physician Evaluation: 1. Functional deficits secondary  to ***. 2. Patient is admitted to receive collaborative, interdisciplinary care between the physiatrist, rehab nursing staff, and therapy team. 3. Patient's level of medical complexity and substantial therapy needs in context of that medical necessity cannot be provided at a lesser intensity of care such as a SNF. 4. Patient has experienced substantial functional loss from his/her baseline which was documented above under the "Functional History" and "Functional Status" headings.  Judging by the patient's diagnosis, physical exam, and functional history, the patient has potential for functional progress which will result in measurable gains while on inpatient rehab.  These gains will be of substantial and practical use upon discharge  in facilitating mobility and self-care at the household level. 5. Physiatrist will provide 24 hour management of medical needs as well as oversight of the therapy plan/treatment and provide guidance as appropriate regarding the interaction of the two. 6. 24 hour rehab nursing will assist with {due [id number]  and help integrate therapy concepts, techniques,education, etc. 7. PT will assess and treat for/with: ***.   Goals are: ***. 8. OT will assess and treat for/with: ***.   Goals are: ***. Therapy *** proceed with showering this patient. 9. SLP will assess and treat for/with: ***.  Goals are: ***. 10. Case Management and Social Worker will assess and treat for psychological  issues and discharge planning. 11. Team conference will be held weekly to assess progress toward goals and to determine barriers to discharge. 12. Patient will receive at least 3 hours of therapy per day at least 5 days per week. 13. ELOS: ***       14. Prognosis:  {[id number]}     *** 08/09/2015

## 2015-08-09 NOTE — H&P (Signed)
Physical Medicine and Rehabilitation Admission H&P   Chief complaint: Weakness  HPI: 70 year old right handed male resident of [place] with history of hypertension, CAD followed by Dr. Peter Martinique, TIA, hyperlipidemia, diabetes mellitus, alcohol abuse. Patient visiting [place] on vacation. Presented to [facility] 08/06/2015 with dizziness and slurred speech after being found down in the bathroom. Denied any chest pain. Noted blood pressure 238/130. EKG normal sinus rhythm. CT MRI imaging showed subacute infarct superior right cerebellar hemisphere with small acute infarct contralateral superior left cerebellar hemisphere. Occluded intracranial right vertebral artery. Chest x-ray negative. CT angiogram of head and neck showed ulcerated atherosclerotic plaque proximal aspect right internal carotid artery with mild stenosis on the order of 15-20%. Also plaque noted left vertebral artery with stenosis 30%. Severe vertebrobasilar stenosis and occlusions. Mild hypokalemia 3.3. TEE completed showing left ventricle mildly enlarged based on the left ventricular end-diastolic volume. LVEF of 40%. Global hypokinesis. Neurology consulted placed on low-dose aspirin and Plavix. Subcutaneous heparin for DVT prophylaxis. Close monitoring of blood pressure with permissive hypertension. Physical occupational therapy continue to follow patient noting mod assist for mobility and activities daily living. Patient was admitted for comprehensive rehabilitation program  Review of Systems  Constitutional: Negative for fever and chills.  HENT: Negative for hearing loss.  Eyes: Positive for double vision. Negative for blurred vision.  Respiratory: Negative for cough and shortness of breath.  Cardiovascular: Positive for palpitations and leg swelling. Negative for chest pain.  Gastrointestinal: Positive for nausea, vomiting and constipation.  Genitourinary: Negative for dysuria and  hematuria.  Musculoskeletal: Positive for myalgias.  Skin: Negative for rash.  Neurological: Positive for speech change and weakness. Negative for seizures, loss of consciousness and headaches.  All other systems reviewed and are negative.     Social History: History of alcohol use. Allergies: NKA   Home:  patient lives with wife. Independent prior to admission. One level home with 2 steps to entry. Patient was still driving prior to admission.  Functional History:  independent prior to admission.  Functional Status:  Mobility: Sit to stand with rolling walker min mod assist. Ambulated with rolling walker mod max assist for distances. Very slow step length. Mid assist to set bed.          ADL:  min mod assist.  Cognition:  dysarthric speech. Some decreased safety awareness    Physical Exam: There were no vitals taken for this visit. Physical Exam  Constitutional: He is oriented to person, place, and time. He appears well-developed.  HENT:  Head: Normocephalic.  Eyes: EOM are normal.  Neck: Normal range of motion. Neck supple.  Cardiovascular: Normal rate and regular rhythm.  Respiratory: Effort normal and breath sounds normal. No respiratory distress.  GI: Soft. Bowel sounds are normal. He exhibits distension.  Neurological: He is alert and oriented to person, place, and time.  Follow simple commands. Speech is dysarthric but intelligible. Fair but limited awareness of deficits.  Skin: Skin is warm and dry.  Motor strength is 4/5 in the right deltoid, biceps, triceps, grip 5/5 in the left deltoid, bicep, tricep, grip 5/5 bilateral hip flexor and knee extensor and dorsiflexor There is moderate dysmetria of right finger-nose-finger There is mild dysmetria right heel to shin No evidence of dysmetria of left upper limb and left lower limb Extraocular movements are intact There is no evidence of nystagmus Sensation intact to light touch bilateral upper and lower  limbs Standing balance is fair static with widen base of support.  Did not check Romberg due to inability to stand with feet together 138/77 pulse 70 respirations 18 temperature 98.6       Medical Problem List and Plan: 1. Dizziness and slurred speech secondary to superior right cerebellar with small acute infarct contralateral superior left cerebellar hemisphere 2. DVT Prophylaxis/Anticoagulation: Subcutaneous heparin. Monitor platelet counts and any signs of bleeding 3. Pain Management: Tylenol as needed 4. CAD/hypertension. Cardizem 360 mg daily. Monitor with increased mobility 5. Neuropsych: This patient is capable of making decisions on his own behalf. 6. Skin/Wound Care: Routine skin checks 7. Fluids/Electrolytes/Nutrition: Routine I&O with follow-up chemistries 8. Diabetes mellitus with peripheral neuropathy. Hemoglobin A1c 8.2. Levemir 18 units twice daily. Check blood sugars before meals and at bedtime 9. Alcohol abuse. Monitor for any signs of withdrawal 10. Hyperlipidemia. Zocor   Post Admission Physician Evaluation: 1. Functional deficits secondary to Ataxia, limb and truncal as well as dysarthria related to superior right cerebellar infarct. 2. Patient is admitted to receive collaborative, interdisciplinary care between the physiatrist, rehab nursing staff, and therapy team. 3. Patient's level of medical complexity and substantial therapy needs in context of that medical necessity cannot be provided at a lesser intensity of care such as a SNF. 4. Patient has experienced substantial functional loss from his/her baseline which was documented above under the "Functional History" and "Functional Status" headings. Judging by the patient's diagnosis, physical exam, and functional history, the patient has potential for functional progress which will result in measurable gains while on inpatient rehab. These gains will be of substantial and practical use upon discharge in  facilitating mobility and self-care at the household level. 5. Physiatrist will provide 24 hour management of medical needs as well as oversight of the therapy plan/treatment and provide guidance as appropriate regarding the interaction of the two. 6. 24 hour rehab nursing will assist with bladder management, bowel management, safety, skin/wound care, disease management, medication administration, pain management and patient education and help integrate therapy concepts, techniques,education, etc. 7. PT will assess and treat for/with: pre gait, gait training, endurance , safety, equipment, neuromuscular re education. Goals are: Mod I/S. 8. OT will assess and treat for/with: ADLs, Cognitive perceptual skills, Neuromuscular re education, safety, endurance, equipment. Goals are: Sup/Mod I. Therapy may proceed with showering this patient. 9. SLP will assess and treat for/with: Dysarthria. Goals are: 100% speech intelligibility. 10. Case Management and Social Worker will assess and treat for psychological issues and discharge planning. 11. Team conference will be held weekly to assess progress toward goals and to determine barriers to discharge. 12. Patient will receive at least 3 hours of therapy per day at least 5 days per week. 13. ELOS: 7 days  14. Prognosis: excellent     Charlett Blake M.D. [facility] FAAPM&R (Sports Med, Neuromuscular Med) Diplomate Am Board of Electrodiagnostic Med  08/09/2015

## 2015-08-10 ENCOUNTER — Inpatient Hospital Stay (HOSPITAL_COMMUNITY): Payer: Self-pay | Admitting: Physical Therapy

## 2015-08-10 ENCOUNTER — Inpatient Hospital Stay (HOSPITAL_COMMUNITY): Payer: Medicare Other | Admitting: Physical Therapy

## 2015-08-10 ENCOUNTER — Inpatient Hospital Stay (HOSPITAL_COMMUNITY): Payer: Medicare Other | Admitting: Speech Pathology

## 2015-08-10 ENCOUNTER — Inpatient Hospital Stay (HOSPITAL_COMMUNITY): Payer: Medicare Other | Admitting: Occupational Therapy

## 2015-08-10 LAB — CBC WITH DIFFERENTIAL/PLATELET
BASOS ABS: 0 10*3/uL (ref 0.0–0.1)
BASOS PCT: 0 %
Eosinophils Absolute: 0.1 10*3/uL (ref 0.0–0.7)
Eosinophils Relative: 3 %
HCT: 43.2 % (ref 39.0–52.0)
HEMOGLOBIN: 15.4 g/dL (ref 13.0–17.0)
Lymphocytes Relative: 20 %
Lymphs Abs: 0.7 10*3/uL (ref 0.7–4.0)
MCH: 34.6 pg — ABNORMAL HIGH (ref 26.0–34.0)
MCHC: 35.6 g/dL (ref 30.0–36.0)
MCV: 97.1 fL (ref 78.0–100.0)
Monocytes Absolute: 0.5 10*3/uL (ref 0.1–1.0)
Monocytes Relative: 15 %
NEUTROS ABS: 2.1 10*3/uL (ref 1.7–7.7)
NEUTROS PCT: 62 %
PLATELETS: 84 10*3/uL — AB (ref 150–400)
RBC: 4.45 MIL/uL (ref 4.22–5.81)
RDW: 13 % (ref 11.5–15.5)
WBC: 3.4 10*3/uL — ABNORMAL LOW (ref 4.0–10.5)

## 2015-08-10 LAB — COMPREHENSIVE METABOLIC PANEL
ALT: 60 U/L (ref 17–63)
AST: 59 U/L — ABNORMAL HIGH (ref 15–41)
Albumin: 3.6 g/dL (ref 3.5–5.0)
Alkaline Phosphatase: 44 U/L (ref 38–126)
Anion gap: 9 (ref 5–15)
BILIRUBIN TOTAL: 1.3 mg/dL — AB (ref 0.3–1.2)
BUN: 12 mg/dL (ref 6–20)
CALCIUM: 9 mg/dL (ref 8.9–10.3)
CHLORIDE: 102 mmol/L (ref 101–111)
CO2: 22 mmol/L (ref 22–32)
CREATININE: 0.9 mg/dL (ref 0.61–1.24)
Glucose, Bld: 173 mg/dL — ABNORMAL HIGH (ref 65–99)
Potassium: 3.7 mmol/L (ref 3.5–5.1)
Sodium: 133 mmol/L — ABNORMAL LOW (ref 135–145)
TOTAL PROTEIN: 7.8 g/dL (ref 6.5–8.1)

## 2015-08-10 LAB — GLUCOSE, CAPILLARY
GLUCOSE-CAPILLARY: 185 mg/dL — AB (ref 65–99)
GLUCOSE-CAPILLARY: 168 mg/dL — AB (ref 65–99)
Glucose-Capillary: 186 mg/dL — ABNORMAL HIGH (ref 65–99)
Glucose-Capillary: 157 mg/dL — ABNORMAL HIGH (ref 65–99)

## 2015-08-10 MED ORDER — ENOXAPARIN SODIUM 40 MG/0.4ML ~~LOC~~ SOLN
40.0000 mg | SUBCUTANEOUS | Status: DC
  Administered 2015-08-10 – 2015-08-17 (×8): 40 mg via SUBCUTANEOUS
  Filled 2015-08-10 (×8): qty 0.4

## 2015-08-10 NOTE — Progress Notes (Signed)
Social Work  Social Work Assessment and Plan  Patient Details  Name: Jose Beck MRN: [id number] Date of Birth: [birth year]  Today's Date: 08/10/2015  Problem List:  Patient Active Problem List   Diagnosis Date Noted  . CVA (cerebral infarction) 08/09/2015   Past Medical History: No past medical history on file. Past Surgical History: No past surgical history on file. Social History:  has no tobacco, alcohol, and drug history on file.  Family / Support Systems Marital Status: Married How Long?: 54 years Patient Roles: Spouse Spouse/Significant Other: Jose Beck [phone]  [phone] Other Supports: Friends and Church members Anticipated Caregiver: wife Ability/Limitations of Caregiver: Wife has no limitations Caregiver Availability: 24/7 Family Dynamics: Pt and wife were on vacation when he suffered a stroke. They have no children but numerous freinds and church members who are supportive and visit often.  Social History Preferred language: English Religion: [employer] Cultural Background: No issues Education: Secretary/administrator educated Read: Yes Write: Yes Employment Status: Retired Freight forwarder Issues: No issues Guardian/Conservator: None-according to MD pt is capable of making his own decisions while here.   Abuse/Neglect Physical Abuse: Denies Verbal Abuse: Denies Sexual Abuse: Denies Exploitation of patient/patient's resources: Denies Self-Neglect: Denies  Emotional Status Pt's affect, behavior adn adjustment status: Pt is motivated to improve and recover from this stroke. He has made improvements since it happened and feels good about this and hopeful this will continue while here on rehab. His wife has been here and will participate as much as possible while here, she she feels comfortable with his care at discharge. Recent Psychosocial Issues: other health issues Pyschiatric History: No history deferred depression screen due to pt appears to be  coping appropriately and doing well. Will continue to monitor and have neuro-psych see while here if needed. He is still adjusting to his first day and the long flight yesterday. Substance Abuse History: ETOH and Tobacco-aware he should quit both-will think about it. Aware of the resources available to him in the community.  Patient / Family Perceptions, Expectations & Goals Pt/Family understanding of illness & functional limitations: Pt and wife have a good understanding of his stroke and deficits. They discuss his treamtent plan with the MD and feel their questions and concerns are being addressed. Pt is not one to hesitate when he has a concern or question. Premorbid pt/family roles/activities: Husband, retiree, home owner, chruch member, etc Anticipated changes in roles/activities/participation: resume Pt/family expectations/goals: Pt states: " I want to be able to do for myself before I leave here."  Wife states: " I hope he can walk and not need physical assist, I can do some though."  [employer]: None Premorbid Home Care/DME Agencies: None Transportation available at discharge: Wife Resource referrals recommended: Support group (specify)  Discharge Planning Living Arrangements: Spouse/significant other Support Systems: Spouse/significant other, Immunologist, Friends/neighbors Type of Residence: Private residence Insurance underwriter Resources: [employer], Multimedia programmer (specify) Web designer) Financial Resources: [place] Referred: No Living Expenses: Own Money Management: Spouse, Patient Does the patient have any problems obtaining your medications?: No Home Management: Wife Patient/Family Preliminary Plans: Return home with wife who can assist if needed. She plans to be here and participate in therapies, but is tired today from the long trip yesterday from [place]. Pt hopes to be here a short time and do well. Aware team's evaluating today  and setting goals for his stay here. Will ask team to disucss with pt while goal setting. Social Work Anticipated Follow Up Needs: HH/OP,  Support Group  Clinical Impression Pleasant gentleman who is motivated and glad to be back home in [place]. His wife is supportive and involved and plans to be here often to participate in therapies. Pt being evaluated today, he wants to be involved in his goals setting. He hopes within then next week to ten days he is home. Will monitor to see if would benefit from neuro-psych while here.  Jose Beck 08/10/2015, 3:20 PM

## 2015-08-10 NOTE — Evaluation (Signed)
Speech Language Pathology Assessment and Plan  Patient Details  Name: Jose Beck MRN: [id number] Date of Birth: 1946-01-24  SLP Diagnosis: Dysarthria;Cognitive Impairments  Rehab Potential: Excellent ELOS: 7-10 days    Today's Date: 08/10/2015 SLP Individual Time: 0800-0900 SLP Individual Time Calculation (min): 60 min   Problem List:  Patient Active Problem List   Diagnosis Date Noted  . CVA (cerebral infarction) 08/09/2015   Past Medical History: No past medical history on file. Past Surgical History: No past surgical history on file.  Assessment / Plan / Recommendation Clinical Impression   70 year old right handed male resident of [place] with history of hypertension, CAD followed by Dr. Peter Martinique, TIA, hyperlipidemia, diabetes mellitus, alcohol abuse. Patient visiting [place] on vacation. Presented [facility]/2015 with dizziness and slurred speech after being found down in the bathroom. Denied any chest pain. Noted blood pressure 238/130. EKG normal sinus rhythm. CT MRI imaging showed subacute infarct superior right cerebellar hemisphere with small acute infarct contralateral superior left cerebellar hemisphere. Occluded intracranial right vertebral artery. Patient was transferred to [facility] and admitted for comprehensive rehabilitation program.  Pt participated in SLP eval which revealed mild-moderate dysarthria which negatively impacts pt's intelligibility and mild cognitive impairments which he was unaware of prior to assessment. Limitations are primarily related to reasoning, recall, and information processing. Swallow function appeared WNL and continuation of regular diet is recommended. Pt verbalized awareness for need for SLP services and motivation to participate.  Skilled Therapeutic Interventions          Pt was given the [facility] Cognitive Assessment (MOCA 7.1) which he scored 24/30 which is consistent with mild  cognitive impairment. Pt's primary areas of limitation were delayed recall, divergent naming and clock drawing. Pt was unaware of his specific errors, however he did verbalize difficulty with feeling "slow". Functional math word problems completed with 3/5 acc. Immediate story retelling 13/17. Complex reading comprehension required increased time.    SLP Assessment  Patient will need skilled Speech Lanaguage Pathology Services during CIR admission    Recommendations  SLP Diet Recommendations: Age appropriate regular solids;Thin Medication Administration: Whole meds with liquid Supervision: Patient able to self feed Postural Changes and/or Swallow Maneuvers: Out of bed for meals Oral Care Recommendations: Oral care BID Recommendations for Other Services: Neuropsych consult Patient destination: Home Follow up Recommendations: Outpatient SLP Equipment Recommended: None recommended by SLP    SLP Frequency 3 to 5 out of 7 days   SLP Duration  SLP Intensity  SLP Treatment/Interventions 7-10 days  Minumum of 1-2 x/day, 30 to 90 minutes  Cognitive remediation/compensation;Multimodal communication approach;Speech/Language facilitation;Therapeutic Activities;Functional tasks;Patient/family education;Medication managment    Pain Pain Assessment Pain Assessment: No/denies pain  Prior Functioning Cognitive/Linguistic Baseline: Within functional limits Type of Home: House  Lives With: Spouse Available Help at Discharge: Family;Available 24 hours/day Vocation: Retired  Function:  Eating Eating   Modified Consistency Diet: No Eating Assist Level: No help, No cues           Cognition Comprehension Comprehension assist level: Follows basic conversation/direction with no assist  Expression   Expression assist level: Expresses basic 50 - 74% of the time/requires cueing 25 - 49% of the time. Needs to repeat parts of sentences.  Social Interaction Social Interaction assist level:  Interacts appropriately 75 - 89% of the time - Needs redirection for appropriate language or to initiate interaction.  Problem Solving Problem solving assist level: Solves basic 75 - 89% of the time/requires cueing 10 -  24% of the time  Memory Memory assist level: Recognizes or recalls 50 - 74% of the time/requires cueing 25 - 49% of the time   Short Term Goals: Week 1: SLP Short Term Goal 1 (Week 1): Pt to demonstrate completion of functional math tasks at mod I level.  SLP Short Term Goal 2 (Week 1): Pt to demonstrate moderate level problem solving/reasoning with min A. SLP Short Term Goal 3 (Week 1): Pt to demonstrate recall of daily events and new information at min A for use of compensatory strategies. SLP Short Term Goal 4 (Week 1): Pt to demonstrate speech intelligibility at the conversational level at mod I level for use of compensatory strategies.  Refer to Care Plan for Long Term Goals  Recommendations for other services: Neuropsych  Discharge Criteria: Patient will be discharged from SLP if patient refuses treatment 3 consecutive times without medical reason, if treatment goals not met, if there is a change in medical status, if patient makes no progress towards goals or if patient is discharged from hospital.  The above assessment, treatment plan, treatment alternatives and goals were discussed and mutually agreed upon: by patient   Weldon Inches, [place], [place] 08/10/2015, 5:22 PM

## 2015-08-10 NOTE — Care Management Note (Signed)
[facility] Individual Statement of Services  Patient Name:  Jose Beck  Date:  08/10/2015  Welcome to the [facility].  Our goal is to provide you with an individualized program based on your diagnosis and situation, designed to meet your specific needs.  With this comprehensive rehabilitation program, you will be expected to participate in at least 3 hours of rehabilitation therapies Monday-Friday, with modified therapy programming on the weekends.  Your rehabilitation program will include the following services:  Physical Therapy (PT), Occupational Therapy (OT), Speech Therapy (ST), 24 hour per day rehabilitation nursing, Therapeutic Recreaction (TR), Case Management (Social Worker), Rehabilitation Medicine, Nutrition Services and Pharmacy Services  Weekly team conferences will be held on Wednesday to discuss your progress.  Your Social Worker will talk with you frequently to get your input and to update you on team discussions.  Team conferences with you and your family in attendance may also be held.  Expected length of stay: 10-12 days   Overall anticipated outcome: supervision/mod/i level  Depending on your progress and recovery, your program may change. Your Social Worker will coordinate services and will keep you informed of any changes. Your Social Worker's name and contact numbers are listed  below.  The following services may also be recommended but are not provided by the [facility] will be made to provide these services after discharge if needed.  Arrangements include referral to agencies that provide these services.  Your insurance has been verified to be:  Medicare & [place] Your primary doctor is:  Dr. [name]  Pertinent information will be shared with your doctor and your insurance  company.  Social Worker:  Ovidio Kin, [place] or ([phone]  Information discussed with and copy given to patient by: [name], 08/10/2015, 10:16 AM

## 2015-08-10 NOTE — Progress Notes (Signed)
70 year old right handed male resident of [place] with history of hypertension, CAD followed by Dr. Peter Martinique, TIA, hyperlipidemia, diabetes mellitus, alcohol abuse. Patient visiting [place] on vacation. Presented to [facility] 08/06/2015 with dizziness and slurred speech after being found down in the bathroom. Denied any chest pain. Noted blood pressure 238/130. EKG normal sinus rhythm. CT MRI imaging showed subacute infarct superior right cerebellar hemisphere with small acute infarct contralateral superior left cerebellar hemisphere. Occluded intracranial right vertebral artery. Chest x-ray negative. CT angiogram of head and neck showed ulcerated atherosclerotic plaque proximal aspect right internal carotid artery with mild stenosis on the order of 15-20%. Also plaque noted left vertebral artery with stenosis 30%. Severe vertebrobasilar stenosis and occlusions. Mild hypokalemia 3.3. TEE completed showing left ventricle mildly enlarged based on the left ventricular end-diastolic volume. LVEF of 40%  Subjective/Complaints: No issues overnite except was hot, no fevers  Review of systems negative for chest pain, shortness of breath, nausea vomiting diarrhea or constipation  Objective: Vital Signs: Blood pressure 162/90, pulse 90, temperature 97.6 F (36.4 C), temperature source Oral, resp. rate 20, SpO2 93 %. No results found. Results for orders placed or performed during the hospital encounter of 08/09/15 (from the past 72 hour(s))  CBC     Status: Abnormal   Collection Time: 08/09/15  5:00 PM  Result Value Ref Range   WBC 3.2 (L) 4.0 - 10.5 K/uL   RBC 4.71 4.22 - 5.81 MIL/uL   Hemoglobin 16.0 13.0 - 17.0 g/dL   HCT 45.6 39.0 - 52.0 %   MCV 96.8 78.0 - 100.0 fL   MCH 34.0 26.0 - 34.0 pg   MCHC 35.1 30.0 - 36.0 g/dL   RDW 12.9 11.5 - 15.5 %   Platelets 90 (L) 150 - 400 K/uL    Comment: REPEATED TO VERIFY SPECIMEN CHECKED FOR CLOTS PLATELET COUNT  CONFIRMED BY SMEAR   Creatinine, serum     Status: None   Collection Time: 08/09/15  5:00 PM  Result Value Ref Range   Creatinine, Ser 0.87 0.61 - 1.24 mg/dL   GFR calc non Af Amer >60 >60 mL/min   GFR calc Af Amer >60 >60 mL/min    Comment: (NOTE) The eGFR has been calculated using the CKD EPI equation. This calculation has not been validated in all clinical situations. eGFR's persistently <60 mL/min signify possible Chronic Kidney Disease.   Glucose, capillary     Status: Abnormal   Collection Time: 08/09/15  6:22 PM  Result Value Ref Range   Glucose-Capillary 205 (H) 65 - 99 mg/dL  Glucose, capillary     Status: Abnormal   Collection Time: 08/09/15  9:44 PM  Result Value Ref Range   Glucose-Capillary 159 (H) 65 - 99 mg/dL  CBC WITH DIFFERENTIAL     Status: Abnormal   Collection Time: 08/10/15  5:30 AM  Result Value Ref Range   WBC 3.4 (L) 4.0 - 10.5 K/uL   RBC 4.45 4.22 - 5.81 MIL/uL   Hemoglobin 15.4 13.0 - 17.0 g/dL   HCT 43.2 39.0 - 52.0 %   MCV 97.1 78.0 - 100.0 fL   MCH 34.6 (H) 26.0 - 34.0 pg   MCHC 35.6 30.0 - 36.0 g/dL   RDW 13.0 11.5 - 15.5 %   Platelets 84 (L) 150 - 400 K/uL    Comment: CONSISTENT WITH PREVIOUS RESULT   Neutrophils Relative % 62 %   Neutro Abs 2.1 1.7 - 7.7 K/uL   Lymphocytes Relative  20 %   Lymphs Abs 0.7 0.7 - 4.0 K/uL   Monocytes Relative 15 %   Monocytes Absolute 0.5 0.1 - 1.0 K/uL   Eosinophils Relative 3 %   Eosinophils Absolute 0.1 0.0 - 0.7 K/uL   Basophils Relative 0 %   Basophils Absolute 0.0 0.0 - 0.1 K/uL  Comprehensive metabolic panel     Status: Abnormal   Collection Time: 08/10/15  5:30 AM  Result Value Ref Range   Sodium 133 (L) 135 - 145 mmol/L   Potassium 3.7 3.5 - 5.1 mmol/L   Chloride 102 101 - 111 mmol/L   CO2 22 22 - 32 mmol/L   Glucose, Bld 173 (H) 65 - 99 mg/dL   BUN 12 6 - 20 mg/dL   Creatinine, Ser 0.90 0.61 - 1.24 mg/dL   Calcium 9.0 8.9 - 10.3 mg/dL   Total Protein 7.8 6.5 - 8.1 g/dL   Albumin 3.6 3.5 -  5.0 g/dL   AST 59 (H) 15 - 41 U/L   ALT 60 17 - 63 U/L   Alkaline Phosphatase 44 38 - 126 U/L   Total Bilirubin 1.3 (H) 0.3 - 1.2 mg/dL   GFR calc non Af Amer >60 >60 mL/min   GFR calc Af Amer >60 >60 mL/min    Comment: (NOTE) The eGFR has been calculated using the CKD EPI equation. This calculation has not been validated in all clinical situations. eGFR's persistently <60 mL/min signify possible Chronic Kidney Disease.    Anion gap 9 5 - 15  Glucose, capillary     Status: Abnormal   Collection Time: 08/10/15  7:01 AM  Result Value Ref Range   Glucose-Capillary 185 (H) 65 - 99 mg/dL       General: No acute distress Mood and affect are appropriate Heart: Regular rate and rhythm no rubs murmurs or extra sounds Lungs: Clear to auscultation, breathing unlabored, no rales or wheezes Abdomen: Positive bowel sounds, soft nontender to palpation, nondistended Extremities: No clubbing, cyanosis, or edema Skin: No evidence of breakdown, no evidence of rash Neurologic: Cranial nerves II through XII intact, motor strength is 5/5 in bilateral deltoid, bicep, tricep, grip, hip flexor, knee extensors, ankle dorsiflexor and plantar flexor Sensory exam normal sensation to light touch and proprioception in bilateral upper and lower extremities Cerebellar exam normal finger to nose to finger as well as heel to shin in L upper and lower extremities, Mild to moderate dysmetria right upper extremity Musculoskeletal: Full range of motion in all 4 extremities. No joint swelling    Assessment/Plan: 1. 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 Physiatrist is providing close team supervision and 24 hour management of active medical problems listed below. Physiatrist and rehab team continue to assess barriers to  discharge/monitor patient progress toward functional and medical goals. FIM:                   Function - Comprehension Comprehension: Auditory Comprehension assist level: Follows basic conversation/direction with no assist  Function - Expression Expression: Verbal Expression assist level: Expresses basic 50 - 74% of the time/requires cueing 25 - 49% of the time. Needs to repeat parts of sentences.  Function - Social Interaction Social Interaction assist level: Interacts appropriately 75 - 89% of the time - Needs redirection for appropriate language or to initiate interaction.  Function - Problem Solving  Problem solving assist level: Solves basic 75 - 89% of the time/requires cueing 10 - 24% of the time  Function - Memory Memory assist level: Recognizes or recalls 90% of the time/requires cueing < 10% of the time Patient normally able to recall (first 3 days only): Current season, Location of own room, Staff names and faces, That he or she is in a hospital  Medical Problem List and Plan: 1.  Dizziness and slurred speech secondary to superior right cerebellar with small acute infarct contralateral superior left cerebellar hemisphere- initiate rehabilitation program 2.  DVT Prophylaxis/Anticoagulation: Subcutaneous heparin. Monitor platelet counts and any signs of bleeding, switch to lovenox 3. Pain Management: Tylenol as needed 4. CAD/hypertension. Cardizem 360 mg daily. Monitor with increased mobility, cont current dose 5. Neuropsych: This patient is capable of making decisions on his own behalf. 6. Skin/Wound Care: Routine skin checks 7. Fluids/Electrolytes/Nutrition: Routine I&O with follow-up chemistries 8. Diabetes mellitus with peripheral neuropathy. Hemoglobin A1c 8.2. Levemir 18 units twice daily. Check blood sugars before meals and at bedtime 9. Alcohol abuse. Monitor for any signs of withdrawal 10. Hyperlipidemia. Zocor LOS (Days) 1 A FACE TO FACE EVALUATION WAS  PERFORMED  KIRSTEINS,ANDREW E 08/10/2015, 8:16 AM

## 2015-08-10 NOTE — Evaluation (Signed)
Occupational Therapy Assessment and Plan  Patient Details  Name: Kharson Rasmusson MRN: [id number] Date of Birth: 06-30-1945  OT Diagnosis: abnormal posture, ataxia, cognitive deficits and muscle weakness (generalized) Rehab Potential: Rehab Potential (ACUTE ONLY): Excellent ELOS: 10-12 days   Today's Date: 08/10/2015 OT Individual Time: 1003-1100 OT Individual Time Calculation (min): 57 min     Problem List:  Patient Active Problem List   Diagnosis Date Noted  . CVA (cerebral infarction) 08/09/2015    Past Medical History: No past medical history on file. Past Surgical History: No past surgical history on file.  Assessment & Plan Clinical Impression: Patient is a 70 y.o. year old male with recent admission to [facility] 08/06/2015 with dizziness and slurred speech after being found down in the bathroom. Denied any chest pain. Noted blood pressure 238/130. EKG normal sinus rhythm. CT MRI imaging showed subacute infarct superior right cerebellar hemisphere with small acute infarct contralateral superior left cerebellar hemisphere. Occluded intracranial right vertebral artery   Patient transferred to CIR on 08/09/2015 .    Patient currently requires min with basic self-care skills secondary to muscle weakness, unbalanced muscle activation, ataxia and decreased coordination, decreased safety awareness and decreased standing balance, decreased postural control and decreased balance strategies.  Prior to hospitalization, patient could complete ADLs with independent .  Patient will benefit from skilled intervention to decrease level of assist with basic self-care skills, increase independence with basic self-care skills and increase level of independence with iADL prior to discharge home with care partner.  Anticipate patient will require intermittent supervision and follow up outpatient.  OT - End of Session Activity Tolerance: Tolerates 30+ min activity with multiple  rests Endurance Deficit: Yes OT Assessment Rehab Potential (ACUTE ONLY): Excellent OT Patient demonstrates impairments in the following area(s): Balance;Cognition;Endurance;Motor;Safety OT Basic ADL's Functional Problem(s): Eating;Grooming;Bathing;Dressing;Toileting OT Advanced ADL's Functional Problem(s): Simple Meal Preparation OT Transfers Functional Problem(s): Toilet;Tub/Shower OT Additional Impairment(s): Fuctional Use of Upper Extremity OT Plan OT Intensity: Minimum of 1-2 x/day, 45 to 90 minutes OT Frequency: 5 out of 7 days OT Duration/Estimated Length of Stay: 10-12 days OT Treatment/Interventions: Balance/vestibular training;Cognitive remediation/compensation;Community reintegration;Functional mobility training;Therapeutic Activities;Patient/family education;UE/LE Coordination activities;UE/LE Strength taining/ROM;DME/adaptive equipment instruction;Neuromuscular re-education;Self Care/advanced ADL retraining;Therapeutic Exercise OT Self Feeding Anticipated Outcome(s): modified independent OT Basic Self-Care Anticipated Outcome(s): supervison to modified independence OT Toileting Anticipated Outcome(s): supervision OT Bathroom Transfers Anticipated Outcome(s): supervision OT Recommendation Patient destination: Home Follow Up Recommendations: 24 hour supervision/assistance;Outpatient OT   Skilled Therapeutic Intervention Began education on selfcare re-training shower level.  Pt with severe balance deficits and moderate apraxia in his pelvis and trunk with attempted ambulation to the bathroom for shower, without use of an assistive device.  He was able to sit on the seat for most of the shower while using the grab bar with min assist for standing to wash peri area.  He was noted standing in the shower when therapist went out in the room to get the RW for transfer out of the shower.  Cued pt to not stand up without assistance as it is not safe.  He ambulated to the bed for dressing  with the RW but pushes it too far in front of him.  He also attempts to grasp the walker with sit to stand instead of pushing up from the arms of the chair.  Educated pt on safe positioning of RW with transfers as well as hand placement.  Short step length on the left side with decreased weightshift to the left  during mobility back to the bedside recliner.  Pt left in recliner with PT present for next session.     OT Evaluation Precautions/Restrictions  Precautions Precautions: Fall Precaution Comments: RUE ataxia, trunk/pelvis ataxia with dynamic balance Restrictions Weight Bearing Restrictions: No  Pain Pain Assessment Pain Assessment: No/denies pain Home Living/Prior Functioning Home Living Family/patient expects to be discharged to:: Private residence Living Arrangements: Spouse/significant other Available Help at Discharge: Family, Available 24 hours/day Type of Home: House Home Access: Stairs to enter Technical brewer of Steps: 3 Entrance Stairs-Rails: None Home Layout: One level Bathroom Shower/Tub: Multimedia programmer: Standard  Lives With: Spouse Prior Function Level of Independence: Independent with basic ADLs, Independent with transfers, Independent with gait, Independent with homemaking with ambulation  Able to Take Stairs?: Yes Driving: Yes Vocation: Retired Leisure: Hobbies-yes (Comment) Comments: enjoys golfing, photography, runs errands in the community most days of the week  ADL  See Function section of chart  Vision/Perception  Vision- History Baseline Vision/History: Wears glasses Wears Glasses: At all times Patient Visual Report: No change from baseline Vision- Assessment Vision Assessment?: No apparent visual deficits  Cognition Overall Cognitive Status: Within Functional Limits for tasks assessed Arousal/Alertness: Awake/alert Year: 2017 Month: March Day of Week: Correct Memory: Appears intact Immediate Memory Recall:  Sock;Bed;Blue Memory Recall: Sock;Blue;Bed Memory Recall Sock: Without Cue Memory Recall Blue: Without Cue Memory Recall Bed: Without Cue Attention: Selective Sustained Attention: Appears intact Selective Attention: Appears intact Awareness: Appears intact Problem Solving: Appears intact Behaviors:  (Flat affect) Safety/Judgment: Impaired Comments: Pt standing in shower before therapist was beside of him.  Needed cueing to stay closer to the RW as well when using.   Sensation Sensation Light Touch: Appears Intact Stereognosis: Appears Intact Hot/Cold: Appears Intact Proprioception: Impaired Detail Proprioception Impaired Details: Impaired RUE Additional Comments: Pt with proprioceptive differences in the right hand/digits but intact in all other areas of the RUE.  Coordination Gross Motor Movements are Fluid and Coordinated: No Fine Motor Movements are Fluid and Coordinated: No Coordination and Movement Description: Pt with increased ataxia noted with RUE use during functional self care tasks.  He was however able to button his shirt with good proficiency.  Slight difficulty when reaching out with the RUE at full arms length to pick up small objects.  Motor  Motor Motor: Ataxia;Hemiplegia Motor - Skilled Clinical Observations: R hemiparesis, ataxia Mobility  Transfers Transfers: Sit to Stand;Stand to Sit Sit to Stand: 4: Min assist;With upper extremity assist;With armrests;From bed Sit to Stand Details: Verbal cues for technique;Visual cues/gestures for sequencing Stand to Sit: 4: Min assist;With armrests;Without upper extremity assist;With upper extremity assist;To chair/3-in-1;To toilet Stand to Sit Details (indicate cue type and reason): Verbal cues for technique;Visual cues/gestures for sequencing  Trunk/Postural Assessment  Cervical Assessment Cervical Assessment: Exceptions to [facility] (cervical protraction) Thoracic Assessment Thoracic Assessment: Exceptions to [facility] (thoracic  rounding noted) Lumbar Assessment Lumbar Assessment: Exceptions to [facility] (maintains posterior pelvic tilt in sitting)  Balance Balance Balance Assessed: Yes Standardized Balance Assessment Standardized Balance Assessment: Timed Up and Go Test Timed Up and Go Test TUG: Normal TUG Normal TUG (seconds): 33.46 (average of 3 trials. ) Static Sitting Balance Static Sitting - Balance Support: No upper extremity supported;Feet supported Static Sitting - Level of Assistance: 6: Modified independent (Device/Increase time) Dynamic Sitting Balance Dynamic Sitting - Balance Support: No upper extremity supported;Feet supported;During functional activity Dynamic Sitting - Level of Assistance: 5: Stand by assistance Static Standing Balance Static Standing - Balance Support: No upper extremity supported  Static Standing - Level of Assistance: 4: Min assist Dynamic Standing Balance Dynamic Standing - Balance Support: No upper extremity supported Dynamic Standing - Level of Assistance: 3: Mod assist Dynamic Standing - Balance Activities:  (during ADL tasks) Extremity/Trunk Assessment RUE Assessment RUE Assessment: Exceptions to [facility] ([facility] WFLs for all joints as well as strength.  Ataxia noted with RUE functional use but pt able to use at diminshed level for all selfcare tasks at this time. ) LUE Assessment LUE Assessment: Within Functional Limits   See Function Navigator for Current Functional Status.   Refer to Care Plan for Long Term Goals  Recommendations for other services: None  Discharge Criteria: Patient will be discharged from OT if patient refuses treatment 3 consecutive times without medical reason, if treatment goals not met, if there is a change in medical status, if patient makes no progress towards goals or if patient is discharged from hospital.  The above assessment, treatment plan, treatment alternatives and goals were discussed and mutually agreed upon: by patient  Lashai Grosch,Lewinski  OTR/L 08/10/2015, 5:12 PM

## 2015-08-10 NOTE — Progress Notes (Signed)
Charlett Blake, MD Physician Signed Physical Medicine and Rehabilitation Progress Notes 08/09/2015 4:03 PM    Expand All Collapse All    Rehab Admission Coordinator Shared Physical Medicine and Rehabilitation PMR Pre-admission 08/09/2015 1:31 PM  Related encounter: Documentation from 08/09/2015 in [place] Collapse All    Secondary Market PMR Admission Coordinator Pre-Admission Assessment  Patient: Jose Beck is an 70 y.o., male MRN: [id number] DOB: June 12, 1945 Height: 5' 10.08" (178 cm) Weight: 125.193 kg (276 lb)  Insurance Information HMO: No PPO: PCP: IPA: 80/20: OTHER:  PRIMARY: Medicare A/B Policy#: [id number] Subscriber: Jonetta Speak CM Name: Phone#: Fax#:  Pre-Cert#: Employer: Retired Benefits: Phone #: Name: Checked in [place]. Date: 03-13-11 Deduct: $1316 Out of Pocket Max: none Life Max: unlimited CIR: 100% SNF: 100 days Outpatient: 80% Co-Pay: 20% Home Health: 100% Co-Pay: none DME: 80% Co-Pay: 20% Providers: patient's choice  SECONDARY: AARP Policy#: [id number] Subscriber: Laban Emperor CM Name: Phone#: Fax#:  Pre-Cert#: Employer: Retired Benefits: Phone #: [phone] Name:  Irene Shipper. Date: Deduct: Out of Pocket Max: Life Max:  CIR: SNF:  Outpatient: Co-Pay:  Home Health: Co-Pay:  DME: Co-Pay:   Emergency Contact Information Contact Information    Name Relation Home Work Mobile   [place]  [phone]  [phone]      Current Medical History  Patient Admitting Diagnosis: L cerebellar infarct with subacute R cerebellar infarct  History of Present Illness: A 70 year old right handed male resident of [place] with history of hypertension, CAD  followed by Dr. Peter Martinique, TIA, hyperlipidemia, diabetes mellitus, alcohol abuse. Patient visiting [place] on vacation. Presented to [facility] 08/06/2015 with dizziness and slurred speech after being found down in the bathroom. Denied any chest pain. Noted blood pressure 238/130. EKG normal sinus rhythm. CT MRI imaging showed subacute infarct superior right cerebellar hemisphere with small acute infarct contralateral superior left cerebellar hemisphere. Occluded intracranial right vertebral artery. Chest x-ray negative. CT angiogram of head and neck showed ulcerated atherosclerotic plaque proximal aspect right internal carotid artery with mild stenosis on the order of 15-20%. Also plaque noted left vertebral artery with stenosis 30%. Severe vertebrobasilar stenosis and occlusions. Mild hypokalemia 3.3. TEE completed showing left ventricle mildly enlarged based on the left ventricular end-diastolic volume. LVEF of 40%. Global hypokinesis. Neurology consulted placed on low-dose aspirin and Plavix. Subcutaneous heparin for DVT prophylaxis. Close monitoring of blood pressure with permissive hypertension. Physical/Occupational therapy continue to follow patient noting mod assist for mobility and activities daily living. Patient to be admitted for comprehensive inpatient rehabilitation program.  Patient's medical record from [facility] has been reviewed by the rehabilitation admission coordinator and physician.  NIH Stroke scale: 3-4  Past Medical History  HTN, CAD, TIA, Hyperlipidemia, Alcohol use, tobacco use  Family History  family history is not on file.  Prior Rehab/Hospitalizations Has the patient had major surgery during 100 days prior to admission? No   Current Medications See MAR from [facility]  Patients Current Diet: Regular diet, thin liquids  Precautions /  Restrictions Precautions Precautions: Fall   Has the patient had 2 or more falls or a fall with injury in the past year?No  Prior Activity Level Community (5-7x/wk): Went out daily. Traveled with wife. Was independent and driving.  Prior Functional Level Self Care: Did the patient need help bathing, dressing, using the toilet or eating? Independent  Indoor Mobility: Did the patient need assistance with walking  from room to room (with or without device)? Independent  Stairs: Did the patient need assistance with internal or external stairs (with or without device)? Independent  Functional Cognition: Did the patient need help planning regular tasks such as shopping or remembering to take medications? Independent  Home Assistive Devices / Equipment Home Assistive Devices/Equipment: None  Prior Device Use: Indicate devices/aids used by the patient prior to current illness, exacerbation or injury? None   Prior Functional Level Current Functional Level  Bed Mobility  Independent  Min assist   Transfers  Independent  Mod assist   Mobility - Walk/Wheelchair  Independent  Mod assist   Upper Body Dressing  Independent  Min assist   Lower Body Dressing  Independent  Mod assist   Grooming  Independent  Min assist   Eating/Drinking  Independent  Min assist   Toilet Transfer  Independent  Mod assist   Bladder Continence   WDL  WDL   Bowel Management  WDL  WDL   Stair Climbing  Independent  Total assist   Communication  Intact  Slurred speech   Memory  Intact  Intact   Cooking/Meal Prep  Wife cooks or they go out to eat     Housework  Wife does housework    Money Management  Independent    Driving  Yes, driving     Special needs/care consideration BiPAP/CPAP No CPM No Continuous Drip IV No Dialysis No  Life Vest  No Oxygen No Special Bed No Trach Size No Wound Vac (area) No  Skin No  Bowel mgmt: WDL Bladder mgmt:WDL Diabetic mgmt Yes, on insulin in hospital most recently  Previous Home Environment Living Arrangements: Spouse/significant other (Lives with wife.) Lives With: Spouse Available Help at Discharge: Family, Available 24 hours/day Type of Home: House Home Layout: One level Home Access: Stairs to enter Technical brewer of Steps: 2 steps  Discharge Living Setting Plans for Discharge Living Setting: Patient's home, House, Lives with (comment) (Lives with wife.) Type of Home at Discharge: House Discharge Home Layout: One level Discharge Home Access: Stairs to enter Entrance Stairs-Number of Steps: 2 steps Does the patient have any problems obtaining your medications?: No  Social/Family/Support Systems Patient Roles: Spouse Contact Information: Arby Hatt - wife Anticipated Caregiver: wife Anticipated Caregiver's Contact Information: [name] - wife - [phone] Ability/Limitations of Caregiver: Wife can assist. Caregiver Availability: 24/7 Discharge Plan Discussed with Primary Caregiver: No Is Caregiver In Agreement with Plan?: Yes Does Caregiver/Family have Issues with Lodging/Transportation while Pt is in Rehab?: No  Goals/Additional Needs Patient/Family Goal for Rehab: PT/OT mod I and supervision goals, ST mod I and I goals Expected length of stay: 7-10 days Cultural Considerations: None Dietary Needs: Regular diet, thin liquids Equipment Needs: TBD Pt/Family Agrees to Admission and willing to participate: Yes Program Orientation Provided & Reviewed with Pt/Caregiver Including Roles & Responsibilities: Yes  Patient Condition: Patient has suffered a L cerebellar infarct. He has been receiving PT/OT/ST while at [facility] in [place]. He is requiring min/mod assist with mobility and ADLs. He is dysarthric,  ataxic, and has mild right side weakness. He can benefit from acute inpatient rehab admission with a coordinated approach to his rehab care. He can tolerate 3 hours of therapy a day. He has a very supportive wife. I have reviewed all information with rehab MD and have approval for acute inpatient rehab admission today.  Preadmission Screen Completed By: Retta Diones, 08/09/2015 3:36 PM ______________________________________________________________________  Discussed status with Dr. Letta Pate on  08/09/15 at 1541 and received telephone approval for admission today.  Admission Coordinator: Retta Diones, time 345pm/Date 08/09/2015   Assessment/Plan: Diagnosis: 1. Does the need for close, 24 hr/day Medical supervision in concert with the patient's rehab needs make it unreasonable for this patient to be served in a less intensive setting? Yes 2. Co-Morbidities requiring supervision/potential complications: CAD,DM, ETOH hx 3. Due to bladder management, bowel management, safety, disease management, medication administration and patient education, does the patient require 24 hr/day rehab nursing? Yes 4. Does the patient require coordinated care of a physician, rehab nurse, PT (1-2 hrs/day, 5 days/week) and OT (1-2 hrs/day, 5 days/week) to address physical and functional deficits in the context of the above medical diagnosis(es)? Yes Addressing deficits in the following areas: balance, endurance, locomotion, strength, transferring, bowel/bladder control, bathing, dressing, feeding, grooming and toileting 5. Can the patient actively participate in an intensive therapy program of at least 3 hrs of therapy 5 days a week? Yes 6. The potential for patient to make measurable gains while on inpatient rehab is excellent 7. Anticipated functional outcomes upon discharge from inpatients are: modified independent and supervision PT, modified independent and supervision OT, modified independent and  supervision SLP 8. Estimated rehab length of stay to reach the above functional goals is: 7-10d 9. Does the patient have adequate social supports to accommodate these discharge functional goals? Yes 10. Anticipated D/C setting: Home 11. Anticipated post D/C treatments: [place] therapy 12. Overall Rehab/Functional Prognosis: excellent    RECOMMENDATIONS: This patient's condition is appropriate for continued rehabilitative care in the following setting: CIR Patient has agreed to participate in recommended program. Yes Note that insurance prior authorization may be required for reimbursement for recommended care.  Comment:  Retta Diones 08/09/2015

## 2015-08-10 NOTE — Evaluation (Signed)
Physical Therapy Assessment and Plan  Patient Details  Name: Jose Beck MRN: [id number] Date of Birth: December 30, 1945  PT Diagnosis: Abnormal posture, Abnormality of gait, Ataxia, Ataxic gait, Difficulty walking, Hemiparesis dominant and Muscle weakness Rehab Potential: Excellent ELOS: 10-12 days   Today's Date: 08/10/2015 PT Individual Time: 1100-1155 PT Individual Time Calculation (min): 55 min    Problem List:  Patient Active Problem List   Diagnosis Date Noted  . CVA (cerebral infarction) 08/09/2015    Past Medical History: No past medical history on file. Past Surgical History: No past surgical history on file.  Assessment & Plan Clinical Impression: A 70 year old right handed male resident of [place] with history of hypertension, CAD followed by Dr. Peter Martinique, TIA, hyperlipidemia, diabetes mellitus, alcohol abuse. Patient visiting [place] on vacation. Presented to [facility] 08/06/2015 with dizziness and slurred speech after being found down in the bathroom. Denied any chest pain. Noted blood pressure 238/130. EKG normal sinus rhythm. CT MRI imaging showed subacute infarct superior right cerebellar hemisphere with small acute infarct contralateral superior left cerebellar hemisphere. Occluded intracranial right vertebral artery. Chest x-ray negative. CT angiogram of head and neck showed ulcerated atherosclerotic plaque proximal aspect right internal carotid artery with mild stenosis on the order of 15-20%. Also plaque noted left vertebral artery with stenosis 30%. Severe vertebrobasilar stenosis and occlusions. Mild hypokalemia 3.3. TEE completed showing left ventricle mildly enlarged based on the left ventricular end-diastolic volume. LVEF of 40%. Global hypokinesis. Neurology consulted placed on low-dose aspirin and Plavix. Subcutaneous heparin for DVT prophylaxis. Close monitoring of blood pressure with permissive hypertension.  Physical/Occupational therapy continue to follow patient noting mod assist for mobility and activities daily living. Patient to be admitted for comprehensive inpatient rehabilitation program. Patient transferred to [facility] on 08/09/2015 .   Patient currently requires min with mobility secondary to muscle weakness, unbalanced muscle activation, ataxia and decreased coordination and decreased standing balance, decreased postural control, hemiplegia and decreased balance strategies.  Prior to hospitalization, patient was independent  with mobility and lived with Spouse in a House home.  Home access is 3Stairs to enter.  Patient will benefit from skilled PT intervention to maximize safe functional mobility, minimize fall risk and decrease caregiver burden for planned discharge home with 24 hour supervision.  Anticipate patient will benefit from follow up OP at discharge.  PT - End of Session Activity Tolerance: Tolerates 30+ min activity with multiple rests Endurance Deficit: Yes Endurance Deficit Description: requires rest breaks after mobility activities, gait deviations and increased instability with prolonged activity PT Assessment Rehab Potential (ACUTE/IP ONLY): Excellent PT Patient demonstrates impairments in the following area(s): Balance;Safety;Endurance;Motor;Sensory PT Transfers Functional Problem(s): Bed Mobility;Bed to Chair;Car;Furniture PT Locomotion Functional Problem(s): Ambulation;Stairs PT Plan PT Intensity: Minimum of 1-2 x/day ,45 to 90 minutes PT Frequency: 5 out of 7 days PT Duration Estimated Length of Stay: 10-12 days PT Treatment/Interventions: Ambulation/gait training;Balance/vestibular training;Community reintegration;Discharge planning;Functional mobility training;Neuromuscular re-education;Patient/family education;Psychosocial support;Stair training;Therapeutic Activities;Therapeutic Exercise;UE/LE Coordination activities;UE/LE Strength taining/ROM PT Transfers Anticipated  Outcome(s): mod I PT Locomotion Anticipated Outcome(s): S ambulation and stairs with LRAD PT Recommendation Recommendations for Other Services: Neuropsych consult Follow Up Recommendations: Outpatient PT Patient destination: Home Equipment Recommended: To be determined Equipment Details: has RW from acute hospital  Skilled Therapeutic Intervention Pt received seated in recliner with handoff from OT after previous session; denies pain and agreeable to treatment. Initial PT evaluation performed and completed. Assessed all mobility as described above with minA overall. Several mild LOB throughout  session while ambulating with RW, particularly when turning corners. Berg assessed without AD and note significant increase in ataxia and instability as compared to with use of RW for BUE support. Pt with flat affect throughout session, poor eye contact. No family available to confirm prior level, baseline personality and affect, however recommending neuropsych consult to assess. Pt educated in rehab process, goals at S level, anticipated LOS at 10-12 days and recommendation that pt have 24/7 S at home upon d/c; pt agreeable to all the above. Remained seated in recliner at completion of session, all needs within reach.   PT Evaluation Precautions/Restrictions Precautions Precautions: Fall General Chart Reviewed: Yes Response to Previous Treatment: Patient reporting fatigue but able to participate. Family/Caregiver Present: No  Pain Pain Assessment Pain Assessment: No/denies pain Home Living/Prior Functioning Home Living Available Help at Discharge: Family;Available 24 hours/day Type of Home: House Home Access: Stairs to enter [employer] of Steps: 3 Entrance Stairs-Rails: None Home Layout: One level  Lives With: Spouse Prior Function Level of Independence: Independent with basic ADLs;Independent with transfers;Independent with gait;Independent with homemaking with ambulation  Able  to Take Stairs?: Yes Driving: Yes Vocation: Retired Leisure: Hobbies-yes (Comment) Comments: enjoys golfing, photography, runs errands in the community most days of the week  Vision/Perception  Perception Comments: WFL  Cognition Arousal/Alertness: Awake/alert Orientation Level: Oriented X4 Sensation Sensation Light Touch: Appears Intact Stereognosis: Not tested Hot/Cold: Not tested Coordination Gross Motor Movements are Fluid and Coordinated: Yes Fine Motor Movements are Fluid and Coordinated: No Finger Nose Finger Test: dysmetria RUE Heel Shin Test: WFL BLE Motor  Motor Motor: Ataxia;Hemiplegia Motor - Skilled Clinical Observations: R hemiparesis, ataxia  Mobility Bed Mobility Bed Mobility: Supine to Sit;Sit to Supine Supine to Sit: 5: Supervision Supine to Sit Details: Verbal cues for precautions/safety Sit to Supine: 5: Supervision Sit to Supine - Details: Verbal cues for precautions/safety Transfers Transfers: Yes Stand Pivot Transfers: 4: Min assist Stand Pivot Transfer Details: Verbal cues for technique;Verbal cues for precautions/safety;Verbal cues for safe use of DME/AE Locomotion  Ambulation Ambulation: Yes Ambulation/Gait Assistance: 4: Min assist;4: Min guard Ambulation Distance (Feet): 200 Feet Assistive device: Rolling walker Ambulation/Gait Assistance Details: Verbal cues for precautions/safety;Verbal cues for technique;Tactile cues for posture;Verbal cues for safe use of DME/AE Gait Gait: Yes Gait Pattern: Impaired Gait Pattern: Poor foot clearance - right;Ataxic;Shuffle;Decreased weight shift to left;Decreased stride length;Decreased step length - right Gait velocity: decreased for age/gender norms Stairs / Additional Locomotion Stairs: Yes Stairs Assistance: 4: Min guard;4: Min assist Stairs Assistance Details: Verbal cues for gait pattern;Verbal cues for precautions/safety;Verbal cues for technique Stair Management Technique: Two rails;Step to  pattern;Forwards Number of Stairs: 8 Height of Stairs: 4 Ramp: 4: Min assist Curb: 3: Mod Administrator Mobility: No  Trunk/Postural Assessment  Cervical Assessment Cervical Assessment: Exceptions to [facility] (forward head posture) Thoracic Assessment Thoracic Assessment: Exceptions to [facility] (increased thoracic kyphosis, rounded shoulders, R shoulder depression) Lumbar Assessment Lumbar Assessment: Exceptions to [facility] (reversal of lumbar lordosis, poserior pelvic tilt) Postural Control Postural Control: Deficits on evaluation Protective Responses: impaired stepping strategies for balance recovery  Balance Balance Balance Assessed: Yes Standardized Balance Assessment Standardized Balance Assessment: Berg Balance Test Berg Balance Test Sit to Stand: Able to stand using hands after several tries Standing Unsupported: Able to stand 2 minutes with supervision Sitting with Back Unsupported but Feet Supported on Floor or Stool: Able to sit safely and securely 2 minutes Stand to Sit: Sits independently, has uncontrolled descent Transfers: Needs one person to  assist Standing Unsupported with Eyes Closed: Able to stand 10 seconds with supervision Standing Ubsupported with Feet Together: Needs help to attain position but able to stand for 30 seconds with feet together From Standing, Reach Forward with Outstretched Arm: Reaches forward but needs supervision From Standing Position, Pick up Object from Floor: Unable to try/needs assist to keep balance From Standing Position, Turn to Look Behind Over each Shoulder: Needs supervision when turning Turn 360 Degrees: Needs assistance while turning Standing Unsupported, Alternately Place Feet on Step/Stool: Needs assistance to keep from falling or unable to try Standing Unsupported, One Foot in Front: Needs help to step but can hold 15 seconds Standing on One Leg: Unable to try or needs assist to prevent fall Total Score: 18 Static  Sitting Balance Static Sitting - Balance Support: No upper extremity supported;Feet supported Static Sitting - Level of Assistance: 6: Modified independent (Device/Increase time) Dynamic Sitting Balance Dynamic Sitting - Balance Support: No upper extremity supported;Feet supported;During functional activity Dynamic Sitting - Level of Assistance: 5: Stand by assistance Dynamic Sitting - Balance Activities: Lateral lean/weight shifting;Forward lean/weight shifting;Reaching for objects;Reaching across midline Static Standing Balance Static Standing - Balance Support: No upper extremity supported Static Standing - Level of Assistance: 4: Min assist Static Stance: Eyes closed Static Stance: Eyes Closed: min guard x30 sec Dynamic Standing Balance Dynamic Standing - Balance Support: No upper extremity supported;During functional activity Dynamic Standing - Level of Assistance: 3: Mod assist (minA to min guard with BUE support on RW) Dynamic Standing - Balance Activities: Reaching across midline;Reaching for objects;Forward lean/weight shifting Extremity Assessment  RUE Assessment RUE Assessment: Within Functional Limits (grossly WFL AROM and strength, ataxic) LUE Assessment LUE Assessment: Within Functional Limits RLE Assessment RLE Assessment: Within Functional Limits (grossly 4+/5 to 5/5 throughout, functional dorsiflexion weakness with foot drag during gait) LLE Assessment LLE Assessment: Within Functional Limits (4+/5 to 5/5 throughout)   See Function Navigator for Current Functional Status.   Refer to Care Plan for Long Term Goals  Recommendations for other services: Neuropsych  Discharge Criteria: Patient will be discharged from PT if patient refuses treatment 3 consecutive times without medical reason, if treatment goals not met, if there is a change in medical status, if patient makes no progress towards goals or if patient is discharged from hospital.  The above assessment,  treatment plan, treatment alternatives and goals were discussed and mutually agreed upon: by patient  Luberta Mutter 08/10/2015, 12:13 PM

## 2015-08-10 NOTE — Progress Notes (Signed)
Physical Therapy Session Note  Patient Details  Name: Jose Beck MRN: [id number] Date of Birth: 10/16/1945  Today's Date: 08/10/2015 PT Individual Time: 0630-1601 PT Individual Time Calculation (min): 29 min   Short Term Goals: Week 1:  PT Short Term Goal 1 (Week 1): Pt will perform stand pivot transfer with S and min verbal cues PT Short Term Goal 2 (Week 1): Pt will ambulate x150' CGA with LRAD PT Short Term Goal 3 (Week 1): Pt will perform ascent/descent of 4 6-inch stairs with 1 handrail PT Short Term Goal 4 (Week 1): Pt will negotiate 1 curb step with min guard and RW  Skilled Therapeutic Interventions/Progress Updates:    Patient instructed in gait training for 214f x 2 with RW and Min A from PT to prevent R Lateral LOB, as well as constant cues for improve step height and step length with the R LE. Patient demonstrated mild improvement in step length directly following instruction, with little to no carryover without verbal cueing. Patient instructed in 161mlk test with RW; see below for results. Patient also instructed in TUG x 3 for average of 3 trials; see below for results. Patient performed stand>sit transfer x 6 throughout treatment, session with min-mod A, due to lack of control with descent, PT provided verbal, tactile, and visual instruction to increase control of stand>sit with each, rep without carryover to next transfer. Patient left sitting in recliner in room with all needs met and call bell within reach.   Therapy Documentation Precautions:  Precautions Precautions: Fall Precaution Comments: RUE ataxia, trunk/pelvis ataxia with dynamic balance Restrictions Weight Bearing Restrictions: No General:   Vital Signs: Therapy Vitals Temp: 98 F (36.7 C) Temp Source: Oral Pulse Rate: 98 Resp: 20 BP: (!) 175/95 mmHg Patient Position (if appropriate): Sitting Oxygen Therapy SpO2: 96 % O2 Device: Not Delivered Pain: Pain Assessment Pain Assessment:  No/denies pain Locomotion : Gait Gait velocity: 0.110m75mwith RW.   Balance: Balance Balance Assessed: Yes Standardized Balance Assessment Standardized Balance Assessment: Timed Up and Go Test Timed Up and Go Test TUG: Normal TUG Normal TUG (seconds): 33.46 (average of 3 trials. )  See Function Navigator for Current Functional Status.   Therapy/Group: Individual Therapy  AusLorie Phenix31/2017, 5:24 PM

## 2015-08-11 ENCOUNTER — Inpatient Hospital Stay (HOSPITAL_COMMUNITY): Payer: Medicare Other | Admitting: Physical Therapy

## 2015-08-11 ENCOUNTER — Inpatient Hospital Stay (HOSPITAL_COMMUNITY): Payer: Medicare Other | Admitting: Speech Pathology

## 2015-08-11 ENCOUNTER — Inpatient Hospital Stay (HOSPITAL_COMMUNITY): Payer: Medicare Other | Admitting: Occupational Therapy

## 2015-08-11 DIAGNOSIS — R269 Unspecified abnormalities of gait and mobility: Secondary | ICD-10-CM

## 2015-08-11 DIAGNOSIS — R270 Ataxia, unspecified: Secondary | ICD-10-CM

## 2015-08-11 DIAGNOSIS — I69393 Ataxia following cerebral infarction: Secondary | ICD-10-CM

## 2015-08-11 DIAGNOSIS — I69398 Other sequelae of cerebral infarction: Secondary | ICD-10-CM

## 2015-08-11 LAB — GLUCOSE, CAPILLARY
GLUCOSE-CAPILLARY: 161 mg/dL — AB (ref 65–99)
GLUCOSE-CAPILLARY: 141 mg/dL — AB (ref 65–99)
GLUCOSE-CAPILLARY: 168 mg/dL — AB (ref 65–99)
Glucose-Capillary: 166 mg/dL — ABNORMAL HIGH (ref 65–99)
Glucose-Capillary: 156 mg/dL — ABNORMAL HIGH (ref 65–99)
Glucose-Capillary: 165 mg/dL — ABNORMAL HIGH (ref 65–99)

## 2015-08-11 NOTE — Progress Notes (Signed)
Speech Language Pathology Daily Session Note  Patient Details  Name: Jose Beck MRN: [id number] Date of Birth: [birth year]  Today's Date: 08/11/2015 SLP Individual Time: [id number] SLP Individual Time Calculation (min): 45 min  Short Term Goals: Week 1: SLP Short Term Goal 1 (Week 1): Pt to demonstrate completion of functional math tasks at mod I level.  SLP Short Term Goal 2 (Week 1): Pt to demonstrate moderate level problem solving/reasoning with min A. SLP Short Term Goal 3 (Week 1): Pt to demonstrate recall of daily events and new information at min A for use of compensatory strategies. SLP Short Term Goal 4 (Week 1): Pt to demonstrate speech intelligibility at the conversational level at mod I level for use of compensatory strategies.  Skilled Therapeutic Interventions: Pt sitting upright in chair for cognitive-linguistic therapy. Pt expressed displeasure re: chair alarm. RN reports that pt was up in room without permission, but pt adamantly denied being up. Explained reason for safety precautions and pt verbalized understanding, but was displeased. Again, decreased awareness into limitations. Pt completed deductive reasoning puzzles with mod A of question cueing to isolate pertinent information. Pt was able to utilize strategy of crossing out information once used with occasional verbal cueing only. Improved performance as the session progressed. Pt required intermittent verbal cueing to slow rate of speech and increase volume to improve intelligibility. No self-monitoring of speech this date. Briefly discussed pt's status with the pt's spouse who reported that she couldn't perform the work we are asking the pt to do.   Function:  Eating Eating   Modified Consistency Diet: No Eating Assist Level: No help, No cues           Cognition Comprehension Comprehension assist level: Follows basic conversation/direction with no assist  Expression   Expression assist level: Expresses  basic 50 - 74% of the time/requires cueing 25 - 49% of the time. Needs to repeat parts of sentences.  Social Interaction Social Interaction assist level: Interacts appropriately 75 - 89% of the time - Needs redirection for appropriate language or to initiate interaction.  Problem Solving Problem solving assist level: Solves basic 75 - 89% of the time/requires cueing 10 - 24% of the time  Memory Memory assist level: Recognizes or recalls 50 - 74% of the time/requires cueing 25 - 49% of the time    Pain Pain Assessment Pain Assessment: No/denies pain  Therapy/Group: Individual Therapy  Vinetta Bergamo 08/11/2015, 3:22 PM

## 2015-08-11 NOTE — Progress Notes (Signed)
Physical Therapy Session Note  Patient Details  Name: Jose Beck MRN: [id number] Date of Birth: [birth year]  Today's Date: 08/11/2015 PT Individual Time: 1445-1530 PT Individual Time Calculation (min): 45 min   Short Term Goals: Week 1:  PT Short Term Goal 1 (Week 1): Pt will perform stand pivot transfer with S and min verbal cues PT Short Term Goal 2 (Week 1): Pt will ambulate x150' CGA with LRAD PT Short Term Goal 3 (Week 1): Pt will perform ascent/descent of 4 6-inch stairs with 1 handrail PT Short Term Goal 4 (Week 1): Pt will negotiate 1 curb step with min guard and RW  Skilled Therapeutic Interventions/Progress Updates:  Pt was seen bedside in the pm. Pt performed multiple sit to stand transfers with c/s to min guard and verbal cues with rolling walker. Pt ambulated 200 feet x 2 with rolling walker and c/s to min guard with verbal cues. Pt ambulated 30 feet with 2 lbs weight on R ankle and 30 feet with 3 lbs weight on R ankle for increased proprioceptive input. Pt ambulated with rolling walker and c/s to min guard and verbal cues. Pt performed R LE hip flex and LAQs with 2 lbs weight on R ankle for increased proprioceptive input. Pt performed cone taps and alternating cone taps, 3 sets x 10 reps each with 3 lbs on R ankle. Following treatment pt returned to room and left sitting up in recliner with wife at bedside.   Therapy Documentation Precautions:  Precautions Precautions: Fall Precaution Comments: RUE ataxia, trunk/pelvis ataxia with dynamic balance Restrictions Weight Bearing Restrictions: No General:   Pain: Pain Assessment Pain Assessment: No/denies pain  See Function Navigator for Current Functional Status.   Therapy/Group: Individual Therapy  [place], [place] 08/11/2015, 3:34 PM

## 2015-08-11 NOTE — Progress Notes (Signed)
08/11/15  1735 nursing LBM 3/29 per patient he does not eat much the last couple of days; RN offered laxatives pt refused claims he wants to do it the natural way.

## 2015-08-11 NOTE — Progress Notes (Signed)
Occupational Therapy Session Note  Patient Details  Name: Jose Beck MRN: [id number] Date of Birth: 01-Jan-1946  Today's Date: 08/11/2015 OT Individual Time: 1100-1203 and 1400-1430 OT Individual Time Calculation (min): 63 min and 30 min   Short Term Goals: Week 1:  OT Short Term Goal 1 (Week 1): Pt will complete all dressing sit to stand with supervison 3 consecutive sessions.  OT Short Term Goal 2 (Week 1): Pt will complete all bathing shower level with supervision 3 consecutive sessions.  OT Short Term Goal 3 (Week 1): Pt will perform walk-in shower transfers with supervision using RW and shower seat.  OT Short Term Goal 4 (Week 1): Pt will perform RUE coordination exercises following handout with independence.  OT Short Term Goal 5 (Week 1): Pt will perform all toilet transfers and toileting using the RW and elevated toilet with supervision.   Skilled Therapeutic Interventions/Progress Updates:    Visit 1: no c/o pain   Pt seen for BADL retraining of toileting, bathing, and dressing with a focus on safety awareness, motor control, balance. Pt able to complete all tasks with steadying A for transfers and close S for ADLs. Good use of RUE with fastening buttons and tying shoes. Pt ambulated to gym with RW to engage in sit><stand exercises without UE, standing balance and R hand coordination with fastening resistive clothespins. Pt ambulated back to room at end of session.  Visit 2:  No c/o pain At second session, pt appeared more fatigue and had increased drag of R leg with walking with cues to stop and rest or cues to fully advance leg. Pt engaged in [facility] of small peg board and inhand manipulation of turning pegs in hand. RUE AROM exercises to increase motor control of arm. Pt had kyphotic posture and tends to hold head down. Provided with orange theraband for sh retraction exercises. Pt practiced one set of ex and then ambulated back to room with occasional steadying A. Pt in recliner  with all needs met.   Pt's spouse present for both sessions for education.   Therapy Documentation Precautions:  Precautions Precautions: Fall Precaution Comments: RUE ataxia, trunk/pelvis ataxia with dynamic balance Restrictions Weight Bearing Restrictions: No    Vital Signs: Therapy Vitals Pulse Rate: 89 BP: (!) 157/82 mmHg Pain: Pain Assessment Pain Assessment: No/denies pain ADL:  See Function Navigator for Current Functional Status.   Therapy/Group: Individual Therapy  [place] 08/11/2015, 1:01 PM

## 2015-08-11 NOTE — Progress Notes (Signed)
Patient A/O, no noted. Patient impulsive behavior, sometimes he will use call light system. He refuses to use urinal at night. Uses rolling walker with ambulation noted some unsteadiness, gait belt is highly recommended with him. Staff will continue to monitor and meet needs. Continue to educate on safety measures.

## 2015-08-11 NOTE — Progress Notes (Signed)
Subjective/Complaints: Slept okay, no new issues overnight.  Review of systems negative for chest pain, shortness of breath, nausea vomiting diarrhea or constipation  Objective: Vital Signs: Blood pressure 157/82, pulse 89, temperature 98.6 F (37 C), temperature source Oral, resp. rate 18, SpO2 98 %. No results found. Results for orders placed or performed during the hospital encounter of 08/09/15 (from the past 72 hour(s))  CBC     Status: Abnormal   Collection Time: 08/09/15  5:00 PM  Result Value Ref Range   WBC 3.2 (L) 4.0 - 10.5 K/uL   RBC 4.71 4.22 - 5.81 MIL/uL   Hemoglobin 16.0 13.0 - 17.0 g/dL   HCT 45.6 39.0 - 52.0 %   MCV 96.8 78.0 - 100.0 fL   MCH 34.0 26.0 - 34.0 pg   MCHC 35.1 30.0 - 36.0 g/dL   RDW 12.9 11.5 - 15.5 %   Platelets 90 (L) 150 - 400 K/uL    Comment: REPEATED TO VERIFY SPECIMEN CHECKED FOR CLOTS PLATELET COUNT CONFIRMED BY SMEAR   Creatinine, serum     Status: None   Collection Time: 08/09/15  5:00 PM  Result Value Ref Range   Creatinine, Ser 0.87 0.61 - 1.24 mg/dL   GFR calc non Af Amer >60 >60 mL/min   GFR calc Af Amer >60 >60 mL/min    Comment: (NOTE) The eGFR has been calculated using the CKD EPI equation. This calculation has not been validated in all clinical situations. eGFR's persistently <60 mL/min signify possible Chronic Kidney Disease.   Glucose, capillary     Status: Abnormal   Collection Time: 08/09/15  6:22 PM  Result Value Ref Range   Glucose-Capillary 205 (H) 65 - 99 mg/dL  Glucose, capillary     Status: Abnormal   Collection Time: 08/09/15  9:44 PM  Result Value Ref Range   Glucose-Capillary 159 (H) 65 - 99 mg/dL  CBC WITH DIFFERENTIAL     Status: Abnormal   Collection Time: 08/10/15  5:30 AM  Result Value Ref Range   WBC 3.4 (L) 4.0 - 10.5 K/uL   RBC 4.45 4.22 - 5.81 MIL/uL   Hemoglobin 15.4 13.0 - 17.0 g/dL   HCT 43.2 39.0 - 52.0 %   MCV 97.1 78.0 - 100.0 fL   MCH 34.6 (H) 26.0 - 34.0 pg   MCHC 35.6 30.0 - 36.0  g/dL   RDW 13.0 11.5 - 15.5 %   Platelets 84 (L) 150 - 400 K/uL    Comment: CONSISTENT WITH PREVIOUS RESULT   Neutrophils Relative % 62 %   Neutro Abs 2.1 1.7 - 7.7 K/uL   Lymphocytes Relative 20 %   Lymphs Abs 0.7 0.7 - 4.0 K/uL   Monocytes Relative 15 %   Monocytes Absolute 0.5 0.1 - 1.0 K/uL   Eosinophils Relative 3 %   Eosinophils Absolute 0.1 0.0 - 0.7 K/uL   Basophils Relative 0 %   Basophils Absolute 0.0 0.0 - 0.1 K/uL   RBC Morphology POLYCHROMASIA PRESENT     Comment: ELLIPTOCYTES  Comprehensive metabolic panel     Status: Abnormal   Collection Time: 08/10/15  5:30 AM  Result Value Ref Range   Sodium 133 (L) 135 - 145 mmol/L   Potassium 3.7 3.5 - 5.1 mmol/L   Chloride 102 101 - 111 mmol/L   CO2 22 22 - 32 mmol/L   Glucose, Bld 173 (H) 65 - 99 mg/dL   BUN 12 6 - 20 mg/dL   Creatinine, Ser 0.90 0.61 -  1.24 mg/dL   Calcium 9.0 8.9 - 10.3 mg/dL   Total Protein 7.8 6.5 - 8.1 g/dL   Albumin 3.6 3.5 - 5.0 g/dL   AST 59 (H) 15 - 41 U/L   ALT 60 17 - 63 U/L   Alkaline Phosphatase 44 38 - 126 U/L   Total Bilirubin 1.3 (H) 0.3 - 1.2 mg/dL   GFR calc non Af Amer >60 >60 mL/min   GFR calc Af Amer >60 >60 mL/min    Comment: (NOTE) The eGFR has been calculated using the CKD EPI equation. This calculation has not been validated in all clinical situations. eGFR's persistently <60 mL/min signify possible Chronic Kidney Disease.    Anion gap 9 5 - 15  Glucose, capillary     Status: Abnormal   Collection Time: 08/10/15  7:01 AM  Result Value Ref Range   Glucose-Capillary 185 (H) 65 - 99 mg/dL  Glucose, capillary     Status: Abnormal   Collection Time: 08/10/15 12:08 PM  Result Value Ref Range   Glucose-Capillary 168 (H) 65 - 99 mg/dL   Comment 1 Notify RN   Glucose, capillary     Status: Abnormal   Collection Time: 08/10/15  4:30 PM  Result Value Ref Range   Glucose-Capillary 186 (H) 65 - 99 mg/dL   Comment 1 Notify RN   Glucose, capillary     Status: Abnormal    Collection Time: 08/10/15  8:02 PM  Result Value Ref Range   Glucose-Capillary 157 (H) 65 - 99 mg/dL  Glucose, capillary     Status: Abnormal   Collection Time: 08/10/15  9:41 PM  Result Value Ref Range   Glucose-Capillary 161 (H) 65 - 99 mg/dL  Glucose, capillary     Status: Abnormal   Collection Time: 08/11/15  1:02 AM  Result Value Ref Range   Glucose-Capillary 166 (H) 65 - 99 mg/dL  Glucose, capillary     Status: Abnormal   Collection Time: 08/11/15  4:07 AM  Result Value Ref Range   Glucose-Capillary 141 (H) 65 - 99 mg/dL   Comment 1 Notify RN        General: No acute distress Mood and affect are appropriate Heart: Regular rate and rhythm no rubs murmurs or extra sounds Lungs: Clear to auscultation, breathing unlabored, no rales or wheezes Abdomen: Positive bowel sounds, soft nontender to palpation, nondistended Extremities: No clubbing, cyanosis, or edema Skin: No evidence of breakdown, no evidence of rash Neurologic: Cranial nerves II through XII intact, motor strength is 5/5 in bilateral deltoid, bicep, tricep, grip, hip flexor, knee extensors, ankle dorsiflexor and plantar flexor Sensory exam normal sensation to light touch and proprioception in bilateral upper and lower extremities Cerebellar exam normal finger to nose to finger as well as heel to shin in L upper and lower extremities, Mild to moderate dysmetria right upper extremity Musculoskeletal: Full range of motion in all 4 extremities. No joint swelling    Assessment/Plan: 1. 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 Physiatrist is providing close team supervision and 24 hour management of active medical problems listed below. Physiatrist and rehab team continue to assess barriers to discharge/monitor patient progress  toward functional and medical goals. FIM: Function - Bathing Position: Shower Body parts bathed by patient: Right arm, Left arm, Chest, Abdomen, Front perineal area, Buttocks, Right upper leg, Left upper  leg, Right lower leg, Left lower leg, Back Assist Level: Touching or steadying assistance(Pt > 75%)  Function- Upper Body Dressing/Undressing What is the patient wearing?: Button up shirt Button up shirt - Perfomed by patient: Thread/unthread right sleeve, Thread/unthread left sleeve, Pull shirt around back, Button/unbutton shirt Assist Level: Set up Function - Lower Body Dressing/Undressing What is the patient wearing?: Shoes, [employer], Pants, Underwear Position: Sitting EOB Underwear - Performed by patient: Thread/unthread right underwear leg, Thread/unthread left underwear leg, Pull underwear up/down Pants- Performed by patient: Thread/unthread right pants leg, Thread/unthread left pants leg, Pull pants up/down Shoes - Performed by patient: Don/doff right shoe, Don/doff left shoe TED Hose - Performed by helper: Don/doff right TED hose, Don/doff left TED hose Assist for lower body dressing: Touching or steadying assistance (Pt > 75%)        Function - Chair/bed transfer Chair/bed transfer method: Stand pivot Chair/bed transfer assist level: Touching or steadying assistance (Pt > 75%) Chair/bed transfer assistive device: Walker Chair/bed transfer details: Verbal cues for precautions/safety, Verbal cues for safe use of DME/AE, Verbal cues for technique  Function - Locomotion: Wheelchair Will patient use wheelchair at discharge?: No Function - Locomotion: Ambulation Assistive device: Walker-rolling Max distance: 228f Assist level: Touching or steadying assistance (Pt > 75%) Assist level: Touching or steadying assistance (Pt > 75%) Assist level: Touching or steadying assistance (Pt > 75%) Assist level: Touching or steadying assistance (Pt > 75%) Assist level: Moderate assist (Pt  50 - 74%)  Function - Comprehension Comprehension: Auditory Comprehension assist level: Follows basic conversation/direction with no assist  Function - Expression Expression: Verbal Expression assist level: Expresses basic 50 - 74% of the time/requires cueing 25 - 49% of the time. Needs to repeat parts of sentences.  Function - Social Interaction Social Interaction assist level: Interacts appropriately 75 - 89% of the time - Needs redirection for appropriate language or to initiate interaction.  Function - Problem Solving Problem solving assist level: Solves basic 75 - 89% of the time/requires cueing 10 - 24% of the time  Function - Memory Memory assist level: Recognizes or recalls 50 - 74% of the time/requires cueing 25 - 49% of the time Patient normally able to recall (first 3 days only): Current season, That he or she is in a hospital  Medical Problem List and Plan: 1.  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  DVT Prophylaxis/Anticoagulation: Subcutaneous heparin. Monitor platelet counts and any signs of bleeding,Continue subcutaneous Lovenox 40 mg per day 3. Pain Management: Tylenol as needed 4. CAD/hypertension. Cardizem 360 mg daily. Monitor with increased mobility, cont current dose 5. Neuropsych: This patient is capable of making decisions on his own behalf. 6. Skin/Wound Care: Routine skin checks 7. Fluids/Electrolytes/Nutrition: Routine I&O with follow-up chemistries 8. Diabetes mellitus with peripheral neuropathy. Hemoglobin A1c 8.2. Levemir 18 units twice daily. Check blood sugars before meals and at bedtime 9. Alcohol abuse. Monitor for any signs of withdrawal, No signs of DTs 10. Hyperlipidemia. Zocor LOS (Days) 2 A FACE TO FACE EVALUATION WAS PERFORMED  KIRSTEINS,ANDREW E 08/11/2015, 10:39 AM

## 2015-08-12 ENCOUNTER — Inpatient Hospital Stay (HOSPITAL_COMMUNITY): Payer: Medicare Other | Admitting: Physical Therapy

## 2015-08-12 DIAGNOSIS — E1142 Type 2 diabetes mellitus with diabetic polyneuropathy: Secondary | ICD-10-CM

## 2015-08-12 LAB — GLUCOSE, CAPILLARY
GLUCOSE-CAPILLARY: 158 mg/dL — AB (ref 65–99)
GLUCOSE-CAPILLARY: 154 mg/dL — AB (ref 65–99)
GLUCOSE-CAPILLARY: 152 mg/dL — AB (ref 65–99)
GLUCOSE-CAPILLARY: 147 mg/dL — AB (ref 65–99)
Glucose-Capillary: 167 mg/dL — ABNORMAL HIGH (ref 65–99)

## 2015-08-12 MED ORDER — INSULIN ASPART 100 UNIT/ML ~~LOC~~ SOLN: 0.0000 [IU] | Freq: Every day | SUBCUTANEOUS | Status: DC

## 2015-08-12 MED ORDER — INSULIN DETEMIR 100 UNIT/ML ~~LOC~~ SOLN
20.0000 [IU] | Freq: Two times a day (BID) | SUBCUTANEOUS | Status: DC
  Administered 2015-08-12 – 2015-08-13 (×2): 20 [IU] via SUBCUTANEOUS
  Filled 2015-08-12 (×3): qty 0.2

## 2015-08-12 MED ORDER — INSULIN ASPART 100 UNIT/ML ~~LOC~~ SOLN
0.0000 [IU] | Freq: Three times a day (TID) | SUBCUTANEOUS | Status: DC
  Administered 2015-08-12 – 2015-08-14 (×7): 4 [IU] via SUBCUTANEOUS
  Administered 2015-08-15: 3 [IU] via SUBCUTANEOUS
  Administered 2015-08-15 (×2): 4 [IU] via SUBCUTANEOUS
  Administered 2015-08-16: 3 [IU] via SUBCUTANEOUS
  Administered 2015-08-16: 4 [IU] via SUBCUTANEOUS
  Administered 2015-08-16 – 2015-08-17 (×2): 3 [IU] via SUBCUTANEOUS

## 2015-08-12 NOTE — Progress Notes (Signed)
Physical Therapy Session Note  Patient Details  Name: [facility] MRN: [id number] Date of Birth: 07/09/1945  Today's Date: 08/12/2015 PT Individual Time: 1445-1516 PT Individual Time Calculation (min): 31 min   Short Term Goals: Week 1:  PT Short Term Goal 1 (Week 1): Pt will perform stand pivot transfer with S and min verbal cues PT Short Term Goal 2 (Week 1): Pt will ambulate x150' CGA with LRAD PT Short Term Goal 3 (Week 1): Pt will perform ascent/descent of 4 6-inch stairs with 1 handrail PT Short Term Goal 4 (Week 1): Pt will negotiate 1 curb step with min guard and RW  Skilled Therapeutic Interventions/Progress Updates:    Pt received in recliner & agreeable to PT, denying c/o pain. Pt ambulated room>gym with steady A x 200 ft with RW. Pt with slightly decreased step length and ataxic RLE. Pt negotiated 4 steps with B railings & steady A, then 4 steps with R ascending rail & steady/Min A. PT provided verbal cuing for sequencing; ascend leading with LLE & descend leading with RLE.  Pt with impaired balance and increased anxiousness when negotiating steps with only 1 railing.  Negotiated single step without railings, but pt tends to support self with forearm on railings; pt required Min A for task & appeared to have great anxiety over task. Throughout session pt required cuing to square up to chair & for proper hand placement on stable surface before transferring sit<>stand. Pt completed 5x sit-to-stand without BUE from 20 inch seat in 20 seconds. Pt completed 3 additional sit-to-stands transfers focusing on proper hand placement. Pt then ambulated back to room requiring Min A with a few instances of unsteadiness on feet. Pt reported need to use restroom & pt able to complete standing tasks without BUE support & steady A. At end of session pt left seated in recliner with all needs within reach & wife present.  Therapy Documentation Precautions:  Precautions Precautions:  Fall Precaution Comments: RUE ataxia, trunk/pelvis ataxia with dynamic balance Restrictions Weight Bearing Restrictions: No  Pain: Pain Assessment Pain Assessment: No/denies pain   See Function Navigator for Current Functional Status.   Therapy/Group: Individual Therapy  [name] 08/12/2015, 4:57 PM

## 2015-08-12 NOTE — IPOC Note (Signed)
Overall Plan of Care [facility]) Patient Details Name: Jose [facility] MRN: [id number] DOB: 1946-03-10  Admitting Diagnosis: cebellar CVA  Hospital Problems: Principal Problem:   Ataxia, post-stroke Active Problems:   CVA (cerebral infarction)   Gait disturbance, post-stroke     Functional Problem List: Nursing Nutrition, Safety  PT Balance, Safety, Endurance, Motor, Sensory  OT Balance, Cognition, Endurance, Motor, Safety  SLP Cognition  TR         Basic ADL's: OT Eating, Grooming, Bathing, Dressing, Toileting     Advanced  ADL's: OT Simple Meal Preparation     Transfers: PT Bed Mobility, Bed to Chair, Car, Manufacturing systems engineer, Metallurgist: PT Ambulation, Stairs     Additional Impairments: OT Fuctional Use of Upper Extremity  SLP Communication, Social Cognition   Memory, Awareness, Problem Solving  TR      Anticipated Outcomes Item Anticipated Outcome  Self Feeding modified independent  Swallowing  independent   Basic self-care  supervison to modified independence  Toileting  supervision   Bathroom Transfers supervision  Bowel/Bladder  Mod I  Transfers  mod I  Locomotion  S ambulation and stairs with LRAD  Communication  mod I  Cognition  min A for high level tasks  Pain  <3  Safety/Judgment  Mod I   Therapy Plan: PT Intensity: Minimum of 1-2 x/day ,45 to 90 minutes PT Frequency: 5 out of 7 days PT Duration Estimated Length of Stay: 10-12 days OT Intensity: Minimum of 1-2 x/day, 45 to 90 minutes OT Frequency: 5 out of 7 days OT Duration/Estimated Length of Stay: 10-12 days SLP Intensity: Minumum of 1-2 x/day, 30 to 90 minutes SLP Frequency: 3 to 5 out of 7 days SLP Duration/Estimated Length of Stay: 7-10 days       Team Interventions: Nursing Interventions Patient/Family Education, Disease Management/Prevention, Discharge Planning  PT interventions Ambulation/gait training, Training and development officer, Community  reintegration, Discharge planning, Functional mobility training, Neuromuscular re-education, Patient/family education, Psychosocial support, Stair training, Therapeutic Activities, Therapeutic Exercise, UE/LE Coordination activities, UE/LE Strength taining/ROM  OT Interventions Training and development officer, Cognitive remediation/compensation, Academic librarian, Functional mobility training, Therapeutic Activities, Patient/family education, UE/LE Coordination activities, UE/LE Strength taining/ROM, DME/adaptive equipment instruction, Neuromuscular re-education, Self Care/advanced ADL retraining, Therapeutic Exercise  SLP Interventions Cognitive remediation/compensation, Multimodal communication approach, Speech/Language facilitation, Therapeutic Activities, Functional tasks, Patient/family education, Medication managment  TR Interventions    SW/CM Interventions Discharge Planning, Psychosocial Support, Patient/Family Education    Team Discharge Planning: Destination: PT-Home ,OT- Home , SLP-Home Projected Follow-up: PT-Outpatient PT, OT-  24 hour supervision/assistance, Outpatient OT, SLP-Outpatient SLP Projected Equipment Needs: PT-To be determined, OT-  , SLP-None recommended by SLP Equipment Details: PT-has RW from acute hospital, OT-  Patient/family involved in discharge planning: PT- Patient,  OT-Patient, SLP-Patient  MD ELOS: 7d Medical Rehab Prognosis:  Good Assessment: 70 year old right handed male resident of [facility] with history of hypertension, CAD followed by Dr. Peter [facility], TIA, hyperlipidemia, diabetes mellitus, alcohol abuse. Patient visiting [place] on vacation. Presented to [facility] 08/06/2015 with dizziness and slurred speech after being found down in the bathroom. Denied any chest pain. Noted blood pressure 238/130. EKG normal sinus rhythm. CT MRI imaging showed subacute infarct superior right cerebellar hemisphere with small acute  infarct contralateral superior left cerebellar hemisphere. Occluded intracranial right vertebral artery. Chest x-ray negative. CT angiogram of head and neck showed ulcerated atherosclerotic plaque proximal aspect right internal carotid artery with mild stenosis on the order of 15-20%. Also plaque  noted left vertebral artery with stenosis 30%. Severe vertebrobasilar stenosis and occlusions. Mild hypokalemia 3.3. TEE completed showing left ventricle mildly enlarged based on the left ventricular end-diastolic volume. LVEF of 40%   Now requiring 24/7 Rehab RN,MD, as well as CIR level PT, OT and SLP.  Treatment team will focus on ADLs and mobility with goals set at Mod I  See Team Conference Notes for weekly updates to the plan of care

## 2015-08-12 NOTE — Progress Notes (Signed)
Subjective/Complaints: Slept okay, no new issues overnight.  Review of systems negative for chest pain, shortness of breath, nausea vomiting diarrhea or constipation  Objective: Vital Signs: Blood pressure 169/79, pulse 83, temperature 98.7 F (37.1 C), temperature source Oral, resp. rate 18, SpO2 98 %. No results found. Results for orders placed or performed during the hospital encounter of 08/09/15 (from the past 72 hour(s))  CBC     Status: Abnormal   Collection Time: 08/09/15  5:00 PM  Result Value Ref Range   WBC 3.2 (L) 4.0 - 10.5 K/uL   RBC 4.71 4.22 - 5.81 MIL/uL   Hemoglobin 16.0 13.0 - 17.0 g/dL   HCT 45.6 39.0 - 52.0 %   MCV 96.8 78.0 - 100.0 fL   MCH 34.0 26.0 - 34.0 pg   MCHC 35.1 30.0 - 36.0 g/dL   RDW 12.9 11.5 - 15.5 %   Platelets 90 (L) 150 - 400 K/uL    Comment: REPEATED TO VERIFY SPECIMEN CHECKED FOR CLOTS PLATELET COUNT CONFIRMED BY SMEAR   Creatinine, serum     Status: None   Collection Time: 08/09/15  5:00 PM  Result Value Ref Range   Creatinine, Ser 0.87 0.61 - 1.24 mg/dL   GFR calc non Af Amer >60 >60 mL/min   GFR calc Af Amer >60 >60 mL/min    Comment: (NOTE) The eGFR has been calculated using the CKD EPI equation. This calculation has not been validated in all clinical situations. eGFR's persistently <60 mL/min signify possible Chronic Kidney Disease.   Glucose, capillary     Status: Abnormal   Collection Time: 08/09/15  6:22 PM  Result Value Ref Range   Glucose-Capillary 205 (H) 65 - 99 mg/dL  Glucose, capillary     Status: Abnormal   Collection Time: 08/09/15  9:44 PM  Result Value Ref Range   Glucose-Capillary 159 (H) 65 - 99 mg/dL  CBC WITH DIFFERENTIAL     Status: Abnormal   Collection Time: 08/10/15  5:30 AM  Result Value Ref Range   WBC 3.4 (L) 4.0 - 10.5 K/uL   RBC 4.45 4.22 - 5.81 MIL/uL   Hemoglobin 15.4 13.0 - 17.0 g/dL   HCT 43.2 39.0 - 52.0 %   MCV 97.1 78.0 - 100.0 fL   MCH 34.6 (H) 26.0 - 34.0 pg   MCHC 35.6 30.0 -  36.0 g/dL   RDW 13.0 11.5 - 15.5 %   Platelets 84 (L) 150 - 400 K/uL    Comment: CONSISTENT WITH PREVIOUS RESULT   Neutrophils Relative % 62 %   Neutro Abs 2.1 1.7 - 7.7 K/uL   Lymphocytes Relative 20 %   Lymphs Abs 0.7 0.7 - 4.0 K/uL   Monocytes Relative 15 %   Monocytes Absolute 0.5 0.1 - 1.0 K/uL   Eosinophils Relative 3 %   Eosinophils Absolute 0.1 0.0 - 0.7 K/uL   Basophils Relative 0 %   Basophils Absolute 0.0 0.0 - 0.1 K/uL   RBC Morphology POLYCHROMASIA PRESENT     Comment: ELLIPTOCYTES  Comprehensive metabolic panel     Status: Abnormal   Collection Time: 08/10/15  5:30 AM  Result Value Ref Range   Sodium 133 (L) 135 - 145 mmol/L   Potassium 3.7 3.5 - 5.1 mmol/L   Chloride 102 101 - 111 mmol/L   CO2 22 22 - 32 mmol/L   Glucose, Bld 173 (H) 65 - 99 mg/dL   BUN 12 6 - 20 mg/dL   Creatinine, Ser 0.90 0.61 -  1.24 mg/dL   Calcium 9.0 8.9 - 10.3 mg/dL   Total Protein 7.8 6.5 - 8.1 g/dL   Albumin 3.6 3.5 - 5.0 g/dL   AST 59 (H) 15 - 41 U/L   ALT 60 17 - 63 U/L   Alkaline Phosphatase 44 38 - 126 U/L   Total Bilirubin 1.3 (H) 0.3 - 1.2 mg/dL   GFR calc non Af Amer >60 >60 mL/min   GFR calc Af Amer >60 >60 mL/min    Comment: (NOTE) The eGFR has been calculated using the CKD EPI equation. This calculation has not been validated in all clinical situations. eGFR's persistently <60 mL/min signify possible Chronic Kidney Disease.    Anion gap 9 5 - 15  Glucose, capillary     Status: Abnormal   Collection Time: 08/10/15  7:01 AM  Result Value Ref Range   Glucose-Capillary 185 (H) 65 - 99 mg/dL  Glucose, capillary     Status: Abnormal   Collection Time: 08/10/15 12:08 PM  Result Value Ref Range   Glucose-Capillary 168 (H) 65 - 99 mg/dL   Comment 1 Notify RN   Glucose, capillary     Status: Abnormal   Collection Time: 08/10/15  4:30 PM  Result Value Ref Range   Glucose-Capillary 186 (H) 65 - 99 mg/dL   Comment 1 Notify RN   Glucose, capillary     Status: Abnormal    Collection Time: 08/10/15  8:02 PM  Result Value Ref Range   Glucose-Capillary 157 (H) 65 - 99 mg/dL  Glucose, capillary     Status: Abnormal   Collection Time: 08/10/15  9:41 PM  Result Value Ref Range   Glucose-Capillary 161 (H) 65 - 99 mg/dL  Glucose, capillary     Status: Abnormal   Collection Time: 08/11/15  1:02 AM  Result Value Ref Range   Glucose-Capillary 166 (H) 65 - 99 mg/dL  Glucose, capillary     Status: Abnormal   Collection Time: 08/11/15  4:07 AM  Result Value Ref Range   Glucose-Capillary 141 (H) 65 - 99 mg/dL   Comment 1 Notify RN   Glucose, capillary     Status: Abnormal   Collection Time: 08/11/15  8:09 AM  Result Value Ref Range   Glucose-Capillary 168 (H) 65 - 99 mg/dL   Comment 1 Notify RN   Glucose, capillary     Status: Abnormal   Collection Time: 08/11/15 12:08 PM  Result Value Ref Range   Glucose-Capillary 156 (H) 65 - 99 mg/dL   Comment 1 Notify RN   Glucose, capillary     Status: Abnormal   Collection Time: 08/11/15  4:15 PM  Result Value Ref Range   Glucose-Capillary 165 (H) 65 - 99 mg/dL   Comment 1 Notify RN   Glucose, capillary     Status: Abnormal   Collection Time: 08/12/15  4:28 AM  Result Value Ref Range   Glucose-Capillary 167 (H) 65 - 99 mg/dL  Glucose, capillary     Status: Abnormal   Collection Time: 08/12/15  7:51 AM  Result Value Ref Range   Glucose-Capillary 158 (H) 65 - 99 mg/dL       General: No acute distress Mood and affect are appropriate Heart: Regular rate and rhythm no rubs murmurs or extra sounds Lungs: Clear to auscultation, breathing unlabored, no rales or wheezes Abdomen: Positive bowel sounds, soft nontender to palpation, nondistended Extremities: No clubbing, cyanosis, or edema Skin: No evidence of breakdown, no evidence of rash Neurologic: Cranial  nerves II through XII intact, motor strength is 5/5 in bilateral deltoid, bicep, tricep, grip, hip flexor, knee extensors, ankle dorsiflexor and plantar  flexor Sensory exam normal sensation to light touch and proprioception in bilateral upper and lower extremities Cerebellar exam normal finger to nose to finger as well as heel to shin in L upper and lower extremities, Mild to moderate dysmetria right upper extremity Musculoskeletal: Full range of motion in all 4 extremities. No joint swelling    Assessment/Plan: 1. 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 Physiatrist is providing close team supervision and 24 hour management of active medical problems listed below. Physiatrist and rehab team continue to assess barriers to discharge/monitor patient progress toward functional and medical goals. FIM: Function - Bathing Position: Shower Body parts bathed by patient: Right arm, Left arm, Chest, Abdomen, Front perineal area, Buttocks, Right upper leg, Left upper leg, Right lower leg, Left lower leg, Back Assist Level: Supervision or verbal cues  Function- Upper Body Dressing/Undressing What is the patient wearing?: Button up shirt Button up shirt - Perfomed by patient: Thread/unthread right sleeve, Thread/unthread left sleeve, Pull shirt around back, Button/unbutton shirt Assist Level: Set up Function - Lower Body Dressing/Undressing What is the patient wearing?: Shoes, [employer], Pants, Underwear Position: Sitting EOB Underwear - Performed by patient: Thread/unthread right underwear leg, Thread/unthread left underwear leg, Pull underwear up/down Pants- Performed by patient: Thread/unthread right pants leg, Thread/unthread left pants leg, Pull pants up/down Shoes - Performed by patient: Don/doff right shoe, Don/doff left shoe, Fasten right, Fasten left TED Hose - Performed by helper: Don/doff right TED hose, Don/doff left TED hose Assist for lower  body dressing: Touching or steadying assistance (Pt > 75%)  Function - Toileting Toileting steps completed by patient: Performs perineal hygiene  Function - Air cabin crew transfer assistive device: Grab bar, Walker Assist level to toilet: Touching or steadying assistance (Pt > 75%) Assist level from toilet: Touching or steadying assistance (Pt > 75%)  Function - Chair/bed transfer Chair/bed transfer method: Stand pivot Chair/bed transfer assist level: Touching or steadying assistance (Pt > 75%) Chair/bed transfer assistive device: Walker Chair/bed transfer details: Verbal cues for precautions/safety, Verbal cues for safe use of DME/AE, Verbal cues for technique  Function - Locomotion: Wheelchair Will patient use wheelchair at discharge?: No Function - Locomotion: Ambulation Assistive device: Walker-rolling Max distance: 200 Assist level: Touching or steadying assistance (Pt > 75%) Assist level: Touching or steadying assistance (Pt > 75%) Assist level: Touching or steadying assistance (Pt > 75%) Assist level: Touching or steadying assistance (Pt > 75%) Assist level: Moderate assist (Pt 50 - 74%)  Function - Comprehension Comprehension: Auditory Comprehension assist level: Follows basic conversation/direction with no assist  Function - Expression Expression: Verbal Expression assist level: Expresses basic 50 - 74% of the time/requires cueing 25 - 49% of the time. Needs to repeat parts of sentences.  Function - Social Interaction Social Interaction assist level: Interacts appropriately 75 - 89% of the time - Needs redirection for appropriate language or to initiate interaction.  Function - Problem Solving Problem solving assist level: Solves basic 75 - 89% of the time/requires cueing 10 - 24% of the time  Function - Memory Memory assist level: Recognizes or recalls 50 - 74% of the time/requires cueing 25 - 49% of the time Patient normally able to recall (first 3 days  only): Current season, That he or she is in a hospital  Medical Problem List and Plan: 1.  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  DVT Prophylaxis/Anticoagulation: Subcutaneous heparin. Monitor platelet counts and any signs of bleeding,Continue subcutaneous Lovenox 40 mg per day 3. Pain Management: Tylenol as needed 4. CAD/hypertension. Cardizem 360 mg daily. Monitor with increased mobility, cont current dose 5. Neuropsych: This patient is capable of making decisions on his own behalf. 6. Skin/Wound Care: Routine skin checks 7. Fluids/Electrolytes/Nutrition: Routine I&O with follow-up chemistries 8. Diabetes mellitus with peripheral neuropathy. Hemoglobin A1c 8.2. Levemir 18 units twice daily.we'll increase to 20 units twice a day Check blood sugars before meals and at bedtime CBG (last 3)   Recent Labs  08/11/15 1615 08/12/15 0428 08/12/15 0751  GLUCAP 165* 167* 158*    9. Alcohol abuse. Monitor for any signs of withdrawal, No signs of DTs 10. Hyperlipidemia. Zocor LOS (Days) 3 A FACE TO FACE EVALUATION WAS PERFORMED  Bern Fare E 08/12/2015, 9:38 AM

## 2015-08-13 ENCOUNTER — Inpatient Hospital Stay (HOSPITAL_COMMUNITY): Payer: Medicare Other | Admitting: Speech Pathology

## 2015-08-13 ENCOUNTER — Inpatient Hospital Stay (HOSPITAL_COMMUNITY): Payer: Medicare Other | Admitting: Physical Therapy

## 2015-08-13 ENCOUNTER — Inpatient Hospital Stay (HOSPITAL_COMMUNITY): Payer: Medicare Other | Admitting: Occupational Therapy

## 2015-08-13 LAB — GLUCOSE, CAPILLARY
GLUCOSE-CAPILLARY: 168 mg/dL — AB (ref 65–99)
GLUCOSE-CAPILLARY: 156 mg/dL — AB (ref 65–99)
GLUCOSE-CAPILLARY: 158 mg/dL — AB (ref 65–99)
Glucose-Capillary: 174 mg/dL — ABNORMAL HIGH (ref 65–99)

## 2015-08-13 MED ORDER — INSULIN DETEMIR 100 UNIT/ML ~~LOC~~ SOLN
22.0000 [IU] | Freq: Two times a day (BID) | SUBCUTANEOUS | Status: DC
  Administered 2015-08-13 – 2015-08-17 (×8): 22 [IU] via SUBCUTANEOUS
  Filled 2015-08-13 (×11): qty 0.22

## 2015-08-13 NOTE — Progress Notes (Signed)
Speech Language Pathology Daily Session Note  Patient Details  Name: Jose Beck MRN: [id number] Date of Birth: April 18, 1946  Today's Date: 08/13/2015 SLP Individual Time: 0803-0900 SLP Individual Time Calculation (min): 57 min  Short Term Goals: Week 1: SLP Short Term Goal 1 (Week 1): Pt to demonstrate completion of functional math tasks at mod I level.  SLP Short Term Goal 2 (Week 1): Pt to demonstrate moderate level problem solving/reasoning with min A. SLP Short Term Goal 3 (Week 1): Pt to demonstrate recall of daily events and new information at min A for use of compensatory strategies. SLP Short Term Goal 4 (Week 1): Pt to demonstrate speech intelligibility at the conversational level at mod I level for use of compensatory strategies.  Skilled Therapeutic Interventions:  Pt was seen for skilled ST targeting cognitive goals.  Pt had completed ST homework given by previous therapist for 100% accuracy when reviewed by SLP.  SLP also administered portions of the ALFA to continue to address higher level functional problem solving for home management tasks.  Pt was 90% accurate for counting money, 100% accurate for solving daily math problems, 100% accurate for balancing a checkbook with SLP assistance for writing down information due to ataxia in dominant upper extremity, 90% accurate for understanding medicine labels, and 90% accurate for reading instructions.  Errors were most commonly characterized by decreased attention to detail.  Recommend addressing recall and organization with pt's currently scheduled medications to continue targeting higher level cognitive skills.  Pt would also continue to benefit from follow up for dysarthria as he still presents with decreased intelligibiltiy in conversations.  Pt left in recliner with call bell within reach.    Function:  Eating Eating               Cognition Comprehension Comprehension assist level: Follows basic  conversation/direction with no assist  Expression   Expression assist level: Expresses basic 90% of the time/requires cueing < 10% of the time.  Social Interaction Social Interaction assist level: Interacts appropriately 75 - 89% of the time - Needs redirection for appropriate language or to initiate interaction.  Problem Solving Problem solving assist level: Solves basic 75 - 89% of the time/requires cueing 10 - 24% of the time  Memory Memory assist level: Recognizes or recalls 75 - 89% of the time/requires cueing 10 - 24% of the time    Pain Pain Assessment Pain Assessment: No/denies pain  Therapy/Group: Individual Therapy  Aubrynn Katona, Selinda Orion 08/13/2015, 3:53 PM

## 2015-08-13 NOTE — Progress Notes (Signed)
Physical Therapy Session Note  Patient Details  Name: Jose Beck MRN: [id number] Date of Birth: 01-06-1946  Today's Date: 08/13/2015 PT Individual Time: 1020-1200 PT Individual Time Calculation (min): 100 min   Short Term Goals: Week 1:  PT Short Term Goal 1 (Week 1): Pt will perform stand pivot transfer with S and min verbal cues PT Short Term Goal 2 (Week 1): Pt will ambulate x150' CGA with LRAD PT Short Term Goal 3 (Week 1): Pt will perform ascent/descent of 4 6-inch stairs with 1 handrail PT Short Term Goal 4 (Week 1): Pt will negotiate 1 curb step with min guard and RW  Skilled Therapeutic Interventions/Progress Updates:    Pt received seated in recliner, denies pain and agreeable to treatment. Reports he would like to take a shower before getting dressed, no OT session until afternoon. Ambulation within room to/from bathroom and to retrieve clothes with RW and close S. Performed showering with S and setupA; seated on shower chair and sit <>stand with grab bars for washing bottom. Performed hygiene at sink with S and UE support on sink. Upper and lower body dressing performed seated on EOB with S for standing balance to pull up boxers and pants. Gait to gym with RW and close S; 2 minor LOBs while turning corner and prior to sitting. Standing balance with dynamic UE reaching to knee level and overhead to retrieve/match playing cards on board. Close S for balance while reaching, and cues for RUE use to improve coordination. Note improved spontaneous use of RUE with decreased cueing, however continued dysmetria and incoordination. Standing balance on balance foam with RW initially, decreased to no UE support, to BUE performing pipe tree; min/modA initially for balance but improved to standbyA with time. Standing alternating toe taps to 1" and 3" step with min/modA initially, decreased to min guard. Gait training in parallel bars without UE support and min guard; forward, backward and  sideways walking. Gradual increase in speed, efficiency, step length with repetition. Gait to return to room with Rw and S. Ambulated into bathroom, performed clothing management and lifting toilet seat, however unable to void. Remained seated in recliner at completion of session, all needs within reach.   Therapy Documentation Precautions:  Precautions Precautions: Fall Precaution Comments: RUE ataxia, trunk/pelvis ataxia with dynamic balance Restrictions Weight Bearing Restrictions: No Pain: Pain Assessment Pain Assessment: No/denies pain   See Function Navigator for Current Functional Status.   Therapy/Group: Individual Therapy  Luberta Mutter 08/13/2015, 12:10 PM

## 2015-08-13 NOTE — Progress Notes (Signed)
Occupational Therapy Session Note  Patient Details  Name: Jose Beck MRN: [id number] Date of Birth: [birth year]  Today's Date: 08/13/2015 OT Individual Time: 1405-1505 OT Individual Time Calculation (min): 60 min    Short Term Goals: Week 1:  OT Short Term Goal 1 (Week 1): Pt will complete all dressing sit to stand with supervison 3 consecutive sessions.  OT Short Term Goal 2 (Week 1): Pt will complete all bathing shower level with supervision 3 consecutive sessions.  OT Short Term Goal 3 (Week 1): Pt will perform walk-in shower transfers with supervision using RW and shower seat.  OT Short Term Goal 4 (Week 1): Pt will perform RUE coordination exercises following handout with independence.  OT Short Term Goal 5 (Week 1): Pt will perform all toilet transfers and toileting using the RW and elevated toilet with supervision.   Skilled Therapeutic Interventions/Progress Updates:    Treatment session with focus on standing balance and RUE coordination.  Pt ambulated to therapy gym with RW and supervision.  Engaged in [place] in standing with focus on reaching and motor control with RUE.  Pt demonstrated ataxia and dysmetria with reaching for illuminated lights on [employer], demonstrating increased time in Rt lower quadrant.  Engaged in jigsaw puzzle in standing with focus on fine and gross motor control with pt demonstrating difficulty with flipping pieces and rotating to align correctly.  Pt required increased time with problem solving with puzzle and motor control requiring cues and encouragement.    Therapy Documentation Precautions:  Precautions Precautions: Fall Precaution Comments: RUE ataxia, trunk/pelvis ataxia with dynamic balance Restrictions Weight Bearing Restrictions: No General:   Vital Signs: Therapy Vitals Temp: 98.4 F (36.9 C) Temp Source: Oral Pulse Rate: 87 Resp: 18 BP: (!) 141/66 mmHg Patient Position (if appropriate): Sitting Oxygen Therapy SpO2: 96  % O2 Device: Not Delivered Pain: Pain Assessment Pain Assessment: No/denies pain  See Function Navigator for Current Functional Status.   Therapy/Group: Individual Therapy  Simonne Come 08/13/2015, 3:49 PM

## 2015-08-13 NOTE — Progress Notes (Signed)
Subjective/Complaints: Patient is ready for full day of therapy today. No new complaints  Review of systems negative for chest pain, shortness of breath, nausea vomiting diarrhea or constipation  Objective: Vital Signs: Blood pressure 154/78, pulse 80, temperature 98.5 F (36.9 C), temperature source Oral, resp. rate 18, SpO2 97 %. No results found. Results for orders placed or performed during the hospital encounter of 08/09/15 (from the past 72 hour(s))  Glucose, capillary     Status: Abnormal   Collection Time: 08/10/15  4:30 PM  Result Value Ref Range   Glucose-Capillary 186 (H) 65 - 99 mg/dL   Comment 1 Notify RN   Glucose, capillary     Status: Abnormal   Collection Time: 08/10/15  8:02 PM  Result Value Ref Range   Glucose-Capillary 157 (H) 65 - 99 mg/dL  Glucose, capillary     Status: Abnormal   Collection Time: 08/10/15  9:41 PM  Result Value Ref Range   Glucose-Capillary 161 (H) 65 - 99 mg/dL  Glucose, capillary     Status: Abnormal   Collection Time: 08/11/15  1:02 AM  Result Value Ref Range   Glucose-Capillary 166 (H) 65 - 99 mg/dL  Glucose, capillary     Status: Abnormal   Collection Time: 08/11/15  4:07 AM  Result Value Ref Range   Glucose-Capillary 141 (H) 65 - 99 mg/dL   Comment 1 Notify RN   Glucose, capillary     Status: Abnormal   Collection Time: 08/11/15  8:09 AM  Result Value Ref Range   Glucose-Capillary 168 (H) 65 - 99 mg/dL   Comment 1 Notify RN   Glucose, capillary     Status: Abnormal   Collection Time: 08/11/15 12:08 PM  Result Value Ref Range   Glucose-Capillary 156 (H) 65 - 99 mg/dL   Comment 1 Notify RN   Glucose, capillary     Status: Abnormal   Collection Time: 08/11/15  4:15 PM  Result Value Ref Range   Glucose-Capillary 165 (H) 65 - 99 mg/dL   Comment 1 Notify RN   Glucose, capillary     Status: Abnormal   Collection Time: 08/12/15  4:28 AM  Result Value Ref Range   Glucose-Capillary 167 (H) 65 - 99 mg/dL  Glucose, capillary      Status: Abnormal   Collection Time: 08/12/15  7:51 AM  Result Value Ref Range   Glucose-Capillary 158 (H) 65 - 99 mg/dL  Glucose, capillary     Status: Abnormal   Collection Time: 08/12/15 11:36 AM  Result Value Ref Range   Glucose-Capillary 154 (H) 65 - 99 mg/dL  Glucose, capillary     Status: Abnormal   Collection Time: 08/12/15  4:23 PM  Result Value Ref Range   Glucose-Capillary 152 (H) 65 - 99 mg/dL  Glucose, capillary     Status: Abnormal   Collection Time: 08/12/15  9:01 PM  Result Value Ref Range   Glucose-Capillary 147 (H) 65 - 99 mg/dL  Glucose, capillary     Status: Abnormal   Collection Time: 08/13/15  5:46 AM  Result Value Ref Range   Glucose-Capillary 168 (H) 65 - 99 mg/dL  Glucose, capillary     Status: Abnormal   Collection Time: 08/13/15 12:00 PM  Result Value Ref Range   Glucose-Capillary 174 (H) 65 - 99 mg/dL   Comment 1 Notify RN        General: No acute distress Mood and affect are appropriate Heart: Regular rate and rhythm no rubs murmurs  or extra sounds Lungs: Clear to auscultation, breathing unlabored, no rales or wheezes Abdomen: Positive bowel sounds, soft nontender to palpation, nondistended Extremities: No clubbing, cyanosis, or edema Skin: No evidence of breakdown, no evidence of rash Neurologic: Cranial nerves II through XII intact, motor strength is 5/5 in bilateral deltoid, bicep, tricep, grip, hip flexor, knee extensors, ankle dorsiflexor and plantar flexor Sensory exam normal sensation to light touch and proprioception in bilateral upper and lower extremities Cerebellar exam normal finger to nose to finger as well as heel to shin in L upper and lower extremities, Mild to moderate dysmetria right upper extremity Musculoskeletal: Full range of motion in all 4 extremities. No joint swelling    Assessment/Plan: 1. 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 Physiatrist is providing close team supervision and 24 hour management of active medical problems listed below. Physiatrist and rehab team continue to assess barriers to discharge/monitor patient progress toward functional and medical goals. FIM: Function - Bathing Position: Shower Body parts bathed by patient: Right arm, Left arm, Chest, Abdomen, Front perineal area, Buttocks, Right upper leg, Left upper leg, Right lower leg, Left lower leg, Back Assist Level: Supervision or verbal cues  Function- Upper Body Dressing/Undressing What is the patient wearing?: Button up shirt Button up shirt - Perfomed by patient: Thread/unthread right sleeve, Thread/unthread left sleeve, Pull shirt around back, Button/unbutton shirt Assist Level: Set up, More than reasonable time Function - Lower Body Dressing/Undressing What is the patient wearing?: Shoes, [employer], Pants, Underwear Position: Sitting EOB Underwear - Performed by patient: Thread/unthread right underwear leg, Thread/unthread left underwear leg, Pull underwear up/down Pants- Performed by patient: Thread/unthread right pants leg, Thread/unthread left pants leg, Pull pants up/down Shoes - Performed by patient: Don/doff right shoe, Don/doff left shoe, Fasten right, Fasten left TED Hose - Performed by patient: Don/doff right TED hose, Don/doff left TED hose TED Hose - Performed by helper: Don/doff right TED hose, Don/doff left TED hose Assist for lower body dressing: Supervision or verbal cues  Function - Toileting Toileting steps completed by patient: Performs perineal hygiene, Adjust clothing prior to toileting, Adjust clothing after toileting  Function - Air cabin crew transfer assistive device: Grab bar, Walker Assist level to toilet: Touching or steadying assistance (Pt > 75%) Assist level from toilet: Touching or  steadying assistance (Pt > 75%)  Function - Chair/bed transfer Chair/bed transfer method: Stand pivot Chair/bed transfer assist level: Supervision or verbal cues Chair/bed transfer assistive device: Walker, Armrests Chair/bed transfer details: Verbal cues for precautions/safety, Verbal cues for safe use of DME/AE, Verbal cues for technique  Function - Locomotion: Wheelchair Will patient use wheelchair at discharge?: No Function - Locomotion: Ambulation Assistive device: Walker-rolling Max distance: 200 ft Assist level: Supervision or verbal cues Assist level: Supervision or verbal cues Assist level: Supervision or verbal cues Assist level: Supervision or verbal cues Assist level: Moderate assist (Pt 50 - 74%)  Function - Comprehension Comprehension: Auditory Comprehension assist level: Follows basic conversation/direction with no assist  Function - Expression Expression: Verbal Expression assist level: Expresses basic 50 - 74% of the time/requires cueing 25 - 49% of the time. Needs to repeat parts of sentences.  Function - Social Interaction Social Interaction assist level: Interacts appropriately 75 - 89% of the time - Needs redirection for appropriate language or to initiate interaction.  Function - Problem Solving Problem  solving assist level: Solves basic 75 - 89% of the time/requires cueing 10 - 24% of the time  Function - Memory Memory assist level: Recognizes or recalls 50 - 74% of the time/requires cueing 25 - 49% of the time Patient normally able to recall (first 3 days only): Current season, That he or she is in a hospital  Medical Problem List and Plan: 1.  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  DVT Prophylaxis/Anticoagulation: Subcutaneous heparin. Monitor platelet counts and any signs of bleeding,Continue subcutaneous  Lovenox 40 mg per day, 3. Pain Management: Tylenol as needed 4. CAD/hypertension. Cardizem 360 mg daily. Monitor with increased mobility, cont current dose 5. Neuropsych: This patient is capable of making decisions on his own behalf. 6. Skin/Wound Care: Routine skin checks 7. Fluids/Electrolytes/Nutrition: Routine I&O , good by mouth intake 8. Diabetes mellitus with peripheral neuropathy. Hemoglobin A1c 8.2. Levemir 20 units twice daily.Check blood sugars before meals and at bedtime, blood sugar is still running in the mid to upper 100s will increase Levemir to 22 units twice a day, may need to resume metformin as well CBG (last 3)   Recent Labs  08/12/15 2101 08/13/15 0546 08/13/15 1200  GLUCAP 147* 168* 174*    9. Alcohol abuse. Monitor for any signs of withdrawal, No signs of DTs 10. Hyperlipidemia. Zocor LOS (Days) 4 A FACE TO FACE EVALUATION WAS PERFORMED  KIRSTEINS,ANDREW E 08/13/2015, 1:23 PM

## 2015-08-14 ENCOUNTER — Inpatient Hospital Stay (HOSPITAL_COMMUNITY): Payer: Medicare Other | Admitting: *Deleted

## 2015-08-14 ENCOUNTER — Inpatient Hospital Stay (HOSPITAL_COMMUNITY): Payer: Medicare Other | Admitting: Occupational Therapy

## 2015-08-14 ENCOUNTER — Inpatient Hospital Stay (HOSPITAL_COMMUNITY): Payer: Medicare Other | Admitting: Speech Pathology

## 2015-08-14 ENCOUNTER — Inpatient Hospital Stay (HOSPITAL_COMMUNITY): Payer: Medicare Other | Admitting: Physical Therapy

## 2015-08-14 LAB — GLUCOSE, CAPILLARY
GLUCOSE-CAPILLARY: 159 mg/dL — AB (ref 65–99)
GLUCOSE-CAPILLARY: 156 mg/dL — AB (ref 65–99)
GLUCOSE-CAPILLARY: 134 mg/dL — AB (ref 65–99)
Glucose-Capillary: 151 mg/dL — ABNORMAL HIGH (ref 65–99)

## 2015-08-14 NOTE — Progress Notes (Signed)
Physical Therapy Session Note  Patient Details  Name: Jose Beck MRN: [place]:[id number] Date of Birth: 1945/10/13  Today's Date: 08/14/2015 PT Individual Time: [id number] and 1415-1500 PT Individual Time Calculation (min): 60 min and 45 min (total 105 min)   Short Term Goals: Week 1:  PT Short Term Goal 1 (Week 1): Pt will perform stand pivot transfer with S and min verbal cues PT Short Term Goal 2 (Week 1): Pt will ambulate x150' CGA with LRAD PT Short Term Goal 3 (Week 1): Pt will perform ascent/descent of 4 6-inch stairs with 1 handrail PT Short Term Goal 4 (Week 1): Pt will negotiate 1 curb step with min guard and RW  Skilled Therapeutic Interventions/Progress Updates:    Tx 1: Pt received seated in recliner, denies pain and agreeable to treatment. Gait to/from gym with RW and close S; occasional mild LOB with RLE foot drag, increase in incidence when pt increasing gait speed. Educated pt on slowing performance of activities to improve coordination and safety. [place] exercises level A and B performed with pt educated on safe performance at home, use of sink/counter for UE support. Rockerboard performed oriented medial/lateral with occasional LOB to R side, improved ability to regain midline without UE support with increased trials. Standing balance with BLEs on wedge for tibialis anterior activation, gastroc/soleus stretch, while performing pipe tree with BUE for coordination and NMR. Returned to room with gait as above; remained seated in recliner at completion of session, all needs within reach.   Tx 2: Pt received seated in recliner with wife present; denies pain and agreeable to treatment. Gait to/from gym with RW and S 2x200'; noted improvement in RLE foot clearance and step symmetry. Biodex with catching game for limits of stability training, weight shifting. Set up 2 4-inch stairs similar to home environment; performed forward ascent and descent with RW, placing RW up onto second step and  down onto floor. Max verbal cues for stepping fully into RW to edge of step to reduce forward lean when placing RW due to increased depth of two stairs. Performed x2 reps with therapist providing min guard. Plan to perform again with wife providing close S and giving pt cues for safety. Returned to room as above; remained seated in recliner at completion of session, all needs within reach. Discussed with wife plan to check her off to perform bathroom transfers with pt during OT or PT session tomorrow.   Therapy Documentation Precautions:  Precautions Precautions: Fall Precaution Comments: RUE ataxia, trunk/pelvis ataxia with dynamic balance Restrictions Weight Bearing Restrictions: No Pain: Pain Assessment Pain Assessment: No/denies pain   See Function Navigator for Current Functional Status.   Therapy/Group: Individual Therapy  Luberta Mutter 08/14/2015, 3:59 PM

## 2015-08-14 NOTE — Progress Notes (Signed)
Occupational Therapy Session Note  Patient Details  Name: Jose Beck MRN: [id number] Date of Birth: March 17, 1946  Today's Date: 08/14/2015 OT Individual Time: [id number] OT Individual Time Calculation (min): 55 min    Short Term Goals: Week 1:  OT Short Term Goal 1 (Week 1): Pt will complete all dressing sit to stand with supervison 3 consecutive sessions.  OT Short Term Goal 2 (Week 1): Pt will complete all bathing shower level with supervision 3 consecutive sessions.  OT Short Term Goal 3 (Week 1): Pt will perform walk-in shower transfers with supervision using RW and shower seat.  OT Short Term Goal 4 (Week 1): Pt will perform RUE coordination exercises following handout with independence.  OT Short Term Goal 5 (Week 1): Pt will perform all toilet transfers and toileting using the RW and elevated toilet with supervision.   Skilled Therapeutic Interventions/Progress Updates:    Treatment session with focus on dynamic standing balance, trunk control, and functional use of BUE.  Pt reports already dressing this AM prior to session and declined bathing.  Ambulated to toilet with RW and supervision, completing toileting tasks with supervision.  Ambulated to therapy gym with RW and supervision.  Engaged in dynamic standing activity incorporating reaching and trunk rotation to obtain items, pt unable to maintain standing balance when crossing midline with reaching therefore would switch arms based on direction reached.  Increased challenge to standing on 2" foam surface to further challenge trunk control and balance while completing table top activity of stacking cups.  Min assist -contact guard for standing balance on compliant surface.  Completed 9 hole peg test with Lt: 1:01 and Rt: 1:08 with noted shakiness on Lt as well as Rt with dysmetria.  Completed five time sit > stand test see below for results. Pt completed with hands on knees as unable to complete with hands across chest.   Five  times Sit to Stand Test (FTSS) Method: Use a straight back chair with a solid seat that is 16-18" high. Ask participant to sit on the chair with arms folded across their chest.   Instructions: "Stand up and sit down as quickly as possible 5 times, keeping your arms folded across your chest."   Measurement: Stop timing when the participant stands the 5th time.  TIME: _20.90_____ (in seconds)  Times > 13.6 seconds is associated with increased disability and morbidity (Guralnik, 2000) Times > 15 seconds is predictive of recurrent falls in healthy individuals aged 34 and older (Buatois, et al., 2008) Normal performance values in community dwelling individuals aged 4 and older (Bohannon, 2006): o 60-69 years: 11.4 seconds o 70-79 years: 12.6 seconds o 80-89 years: 14.8 seconds  MCID: ? 2.3 seconds for Vestibular Disorders Mariah Milling, 2006)   Therapy Documentation Precautions:  Precautions Precautions: Fall Precaution Comments: RUE ataxia, trunk/pelvis ataxia with dynamic balance Restrictions Weight Bearing Restrictions: No General:   Vital Signs: Therapy Vitals Pulse Rate: 91 BP: (!) 154/81 mmHg Pain: Pain Assessment Pain Assessment: No/denies pain  See Function Navigator for Current Functional Status.   Therapy/Group: Individual Therapy  Simonne Come 08/14/2015, 10:12 AM

## 2015-08-14 NOTE — Progress Notes (Signed)
Speech Language Pathology Daily Session Note  Patient Details  Name: Jose Beck MRN: [id number] Date of Birth: 06-04-1945  Today's Date: 08/14/2015 SLP Individual Time: 1120-1200 SLP Individual Time Calculation (min): 40 min  Short Term Goals: Week 1: SLP Short Term Goal 1 (Week 1): Pt to demonstrate completion of functional math tasks at mod I level.  SLP Short Term Goal 2 (Week 1): Pt to demonstrate moderate level problem solving/reasoning with min A. SLP Short Term Goal 3 (Week 1): Pt to demonstrate recall of daily events and new information at min A for use of compensatory strategies. SLP Short Term Goal 4 (Week 1): Pt to demonstrate speech intelligibility at the conversational level at mod I level for use of compensatory strategies.  Skilled Therapeutic Interventions:  Pt was seen for skilled ST targeting communication goals. SLP facilitated the session with a verbal description task targeting intelligibility in conversations.  SLP provided skilled instruction regarding compensatory intelligibility strategies, emphasizing slow rate and increased vocal intensity.  Pt required min assist verbal cues to utilize strategies during the abovementioned task to achieve intelligibility.  Pt was left in recliner with call bell within reach.  Continue per current plan of care.    Function:  Eating Eating                 Cognition Comprehension Comprehension assist level: Follows complex conversation/direction with no assist  Expression   Expression assist level: Expresses basic 90% of the time/requires cueing < 10% of the time.  Social Interaction Social Interaction assist level: Interacts appropriately 75 - 89% of the time - Needs redirection for appropriate language or to initiate interaction.  Problem Solving Problem solving assist level: Solves basic 75 - 89% of the time/requires cueing 10 - 24% of the time  Memory Memory assist level: Recognizes or recalls 75 - 89% of the  time/requires cueing 10 - 24% of the time    Pain Pain Assessment Pain Assessment: No/denies pain  Therapy/Group: Individual Therapy  Jose Beck, Selinda Orion 08/14/2015, 2:39 PM

## 2015-08-14 NOTE — Progress Notes (Signed)
Recreational Therapy Assessment and Plan  Patient Details  Name: Jose Beck MRN: [id number] Date of Birth: 1946-03-24 Today's Date: 08/14/2015  Assessment Clinical Impression: Problem List:  Patient Active Problem List   Diagnosis Date Noted  . CVA (cerebral infarction) 08/09/2015    Past Medical History: No past medical history on file. Past Surgical History: No past surgical history on file.  Assessment & Plan Clinical Impression: Patient is a 70 y.o. year old male with recent admission to [facility] 08/06/2015 with dizziness and slurred speech after being found down in the bathroom. Denied any chest pain. Noted blood pressure 238/130. EKG normal sinus rhythm. CT MRI imaging showed subacute infarct superior right cerebellar hemisphere with small acute infarct contralateral superior left cerebellar hemisphere. Occluded intracranial right vertebral artery Patient transferred to CIR on 08/09/2015.     Pt presents with decreased activity tolerance, decreased functional mobility, decreased balance, ataxia, decreased safety awareness Limiting pt's independence with leisure/community pursuits.  Pt referred by team to participate in community reintegration/outing.  Discuss the purpose and potential goals, pt and wife agreeable to participate.  Pt participated in a lunch outing to [employer] at overall supervision level with exception of ascending/descending van steps in which pt required min assist.  Pt close supervision for curb negotiation with min verbal cues using RW.  Goals focused on safe functional mobility on indoor/outdoor community surfaces, identification & negotiation of obstacles, accessing public restroom, energy conservation, money management, discharge planning & education with pt and wife.  See outing goal sheet in shadow cart for full details.  No further TR as pt is expected to discharge in the next few days.  Nor further TR as pt is expected  to discharge home in next few days.  Leisure History/Participation Premorbid leisure interest/current participation: Sports - Golf;Community - Travel (Comment) (out to eat) Other Leisure Interests: Television;Movies;Reading;Computer Leisure Participation Style: With Family/Friends Awareness of Community Resources: Good-identify 3 post discharge leisure resources Psychosocial / Spiritual Social interaction - Mood/Behavior: Cooperative Academic librarian Appropriate for Education?: Yes Patient Agreeable to Outing?: Yes Strengths/Weaknesses Patient Strengths/Abilities: Willingness to participate;Active premorbidly  Plan Rec Therapy Plan Is patient appropriate for Therapeutic Recreation?: Yes TR Treatment/Interventions: Community reintegration;Patient/family education  Discharge Criteria: Patient will be discharged from TR if patient refuses treatment 3 consecutive times without medical reason.  If treatment goals not met, if there is a change in medical status, if patient makes no progress towards goals or if patient is discharged from hospital.  The above assessment, treatment plan, treatment alternatives and goals were discussed and mutually agreed upon: by patient  [place] 08/14/2015, 4:02 PM

## 2015-08-15 ENCOUNTER — Inpatient Hospital Stay (HOSPITAL_COMMUNITY): Payer: Medicare Other | Admitting: Speech Pathology

## 2015-08-15 ENCOUNTER — Inpatient Hospital Stay (HOSPITAL_COMMUNITY): Payer: Medicare Other | Admitting: *Deleted

## 2015-08-15 ENCOUNTER — Inpatient Hospital Stay (HOSPITAL_COMMUNITY): Payer: Medicare Other | Admitting: Physical Therapy

## 2015-08-15 ENCOUNTER — Inpatient Hospital Stay (HOSPITAL_COMMUNITY): Payer: Medicare Other | Admitting: Occupational Therapy

## 2015-08-15 LAB — GLUCOSE, CAPILLARY
GLUCOSE-CAPILLARY: 165 mg/dL — AB (ref 65–99)
GLUCOSE-CAPILLARY: 178 mg/dL — AB (ref 65–99)
GLUCOSE-CAPILLARY: 158 mg/dL — AB (ref 65–99)
Glucose-Capillary: 141 mg/dL — ABNORMAL HIGH (ref 65–99)
Glucose-Capillary: 150 mg/dL — ABNORMAL HIGH (ref 65–99)

## 2015-08-15 NOTE — Progress Notes (Signed)
Speech Language Pathology Daily Session Note  Patient Details  Name: Jose Beck MRN: [place]:[id number] Date of Birth: 01-Jan-1946  Today's Date: 08/15/2015 SLP Individual Time: 1005-1030 SLP Individual Time Calculation (min): 25 min  Short Term Goals: Week 1: SLP Short Term Goal 1 (Week 1): Pt to demonstrate completion of functional math tasks at mod I level.  SLP Short Term Goal 2 (Week 1): Pt to demonstrate moderate level problem solving/reasoning with min A. SLP Short Term Goal 3 (Week 1): Pt to demonstrate recall of daily events and new information at min A for use of compensatory strategies. SLP Short Term Goal 4 (Week 1): Pt to demonstrate speech intelligibility at the conversational level at mod I level for use of compensatory strategies.  Skilled Therapeutic Interventions:  Pt was seen for skilled ST targeting cognitive goals.  SLP facilitated the session with a medication management task targeting recall of new information.  Pt reported only taking 2 medications prior to admission but was able to recall 6 out of 7 newly scheduled medications without assistance.  Pt reported use of a pill box prior to admission.  Will plan to address organization of pills into a pill box at next available appointment.  Pt's wife was present throughout therapy session and is aware of current goals being addressed in [place].  Pt was handed off to OT.   Continue per current plan of care.    Function:  Eating Eating                 Cognition Comprehension Comprehension assist level: Follows basic conversation/direction with extra time/assistive device  Expression   Expression assist level: Expresses basic 75 - 89% of the time/requires cueing 10 - 24% of the time. Needs helper to occlude trach/needs to repeat words.  Social Interaction Social Interaction assist level: Interacts appropriately 75 - 89% of the time - Needs redirection for appropriate language or to initiate interaction.  Problem Solving  Problem solving assist level: Solves basic 75 - 89% of the time/requires cueing 10 - 24% of the time  Memory Memory assist level: Recognizes or recalls 75 - 89% of the time/requires cueing 10 - 24% of the time    Pain Pain Assessment Pain Assessment: No/denies pain  Therapy/Group: Individual Therapy  Jamesyn Lindell, Elmyra Ricks L 08/15/2015, 11:11 AM

## 2015-08-15 NOTE — Progress Notes (Signed)
Occupational Therapy Session Note  Patient Details  Name: Jose Beck MRN: [id number] Date of Birth: 04/05/1946  Today's Date: 08/15/2015 OT Individual Time: [id number] OT Individual Time Calculation (min): 45 min    Short Term Goals: Week 1:  OT Short Term Goal 1 (Week 1): Pt will complete all dressing sit to stand with supervison 3 consecutive sessions.  OT Short Term Goal 2 (Week 1): Pt will complete all bathing shower level with supervision 3 consecutive sessions.  OT Short Term Goal 3 (Week 1): Pt will perform walk-in shower transfers with supervision using RW and shower seat.  OT Short Term Goal 4 (Week 1): Pt will perform RUE coordination exercises following handout with independence.  OT Short Term Goal 5 (Week 1): Pt will perform all toilet transfers and toileting using the RW and elevated toilet with supervision.   Skilled Therapeutic Interventions/Progress Updates:    ADL retraining with focus on dynamic balance and functional use of RUE.  Pt ambulated to room shower with RW and close supervision-contact guard with shower transfer.  Bathing completed mostly at seated position in shower, standing only to wash buttocks.  Pt utilized grab bar for stability in standing, noted mild truncal ataxia when switching UE support.  Dressing completed with setup at sit > stand level.  Educated on sitting to don and doff LB clothing secondary to fall risk and truncal ataxia.  Discussed bathroom setup with pt and wife.  Engaged in simulated walk-in shower transfer with stepping over 5" ledge to simulate entry into pt's walk-in shower with contact guard.  Educated on sidestepping over ledge with RW for stability.  Question wether RW will fit into shower; pt will require UE support during transfer.  Began to educate on alternatives, when pt stated therapist and wife were "making problems where there aren't any" and declined further attempts.  Therapy Documentation Precautions:   Precautions Precautions: Fall Precaution Comments: RUE ataxia, trunk/pelvis ataxia with dynamic balance Restrictions Weight Bearing Restrictions: No Pain:  Pt with no c/o pain  See Function Navigator for Current Functional Status.   Therapy/Group: Individual Therapy  Simonne Come 08/15/2015, 11:09 AM

## 2015-08-15 NOTE — Progress Notes (Signed)
Social Work [name], LCSW Social Worker Signed  Patient Care Conference 08/15/2015 12:56 PM    Expand All Collapse All   Inpatient RehabilitationTeam Conference and Plan of Care Update Date: 08/15/2015   Time: 11:10 AM     Patient Name: Jose Beck       Medical Record Number: [id number]  Date of Birth: 08/29/1945 Sex: Male         Room/Bed: 4W07C/4W07C-01 Payor Info: Payor: MEDICARE / Plan: MEDICARE PART A AND B / Product Type: *No Product type* /    Admitting Diagnosis: cebellar CVA   Admit Date/Time:  08/09/2015  3:24 PM Admission Comments: No comment available   Primary Diagnosis:  Ataxia, post-stroke Principal Problem: Ataxia, post-stroke    Patient Active Problem List     Diagnosis  Date Noted   .  Ataxia, post-stroke  08/11/2015   .  Gait disturbance, post-stroke  08/11/2015   .  CVA (cerebral infarction)  08/09/2015     Expected Discharge Date: Expected Discharge Date: 08/17/15  Team Members Present: Physician leading conference: Dr. Alysia Penna Social Worker Present: Ovidio Kin, LCSW Nurse Present: Dorien Chihuahua, RN PT Present: Kem Parkinson, PT OT Present: Simonne Come, OT SLP Present: Windell Moulding, SLP PPS Coordinator present : Daiva Nakayama, RN, CRRN        Current Status/Progress  Goal  Weekly Team Focus   Medical     RUE dysmetria, poor cognition, needs help with meds  Upgrade to Mod I  D/C planning   Bowel/Bladder     Patient continent of bowel and bladder, LBM 08/13/15   remain continent  assess q shift   Swallow/Nutrition/ Hydration     na         ADL's     min assist - supervision overall due to truncal and BUE ataxia   supervision overall  ADL retraining, RUE NMR, dynamic standing balance, pt/family education   Mobility     min guard/S gait with RW, min guard stairs, S transfers   S overall  stair training for home entry, endurance, dynamic standing balance, R NMR   Communication     moderate dysarthria, min assist for  intelligibility in conversations   mod I   increased vocal intensity and slow rate; carryover of intelligibility strategies    Safety/Cognition/ Behavioral Observations    some unsafe behavior at times/bed alarm   min assist  continue to monitor   Pain     no c/o pain  min assist  continue to monitor q shift   Skin     no skin issues  min assist  monitor q shift      *See Care Plan and progress notes for long and short-term goals.    Barriers to Discharge:  poor awareness of deficits     Possible Resolutions to Barriers:   caregiver training     Discharge Planning/Teaching Needs:   HOme with wife who can provide supervision level, pt anxious to know when he is going home.       Team Discussion:    Goals-supervision level and target discharge 4/7. Outing with TR today with wife. Carryover poor and will need assistance with finances and medication management. Has all equipment. Pt has a very flat affect, very matter of fact. Need wife to do more education tomorrow with therapies.   Revisions to Treatment Plan:    DC Friday     Continued Need for Acute Rehabilitation Level of Care: The patient  requires daily medical management by a physician with specialized training in physical medicine and rehabilitation for the following conditions: Daily direction of a multidisciplinary physical rehabilitation program to ensure safe treatment while eliciting the highest outcome that is of practical value to the patient.: Yes Daily medical management of patient stability for increased activity during participation in an intensive rehabilitation regime.: Yes Daily analysis of laboratory values and/or radiology reports with any subsequent need for medication adjustment of medical intervention for : Neurological problems;Mood/behavior problems  [name] 08/15/2015, 12:56 PM                  Patient ID: Jose Beck, male   DOB: 02/16/1946, 70 y.o.   MRN: [id number]

## 2015-08-15 NOTE — Patient Care Conference (Signed)
Inpatient RehabilitationTeam Conference and Plan of Care Update Date: 08/15/2015   Time: 11:10 AM    Patient Name: Jose Beck      Medical Record Number: [id number]  Date of Birth: May 06, 1946 Sex: Male         Room/Bed: 4W07C/4W07C-01 Payor Info: Payor: MEDICARE / Plan: MEDICARE PART A AND B / Product Type: *No Product type* /    Admitting Diagnosis: cebellar CVA  Admit Date/Time:  08/09/2015  3:24 PM Admission Comments: No comment available   Primary Diagnosis:  Ataxia, post-stroke Principal Problem: Ataxia, post-stroke  Patient Active Problem List   Diagnosis Date Noted  . 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 Gait disturbance, 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  . CVA (cerebral infarction) 08/09/2015    Expected Discharge Date: Expected Discharge Date: 08/17/15  Team Members Present: Physician leading conference: Dr. Alysia Penna Social Worker Present: Ovidio Kin, LCSW Nurse Present: Dorien Chihuahua, RN PT Present: Kem Parkinson, PT OT Present: Simonne Come, OT SLP Present: Windell Moulding, SLP PPS Coordinator present : Daiva Nakayama, RN, CRRN     Current Status/Progress Goal Weekly Team Focus  Medical   RUE dysmetria, poor cognition, needs help with meds  Upgrade to Mod I  D/C planning   Bowel/Bladder   Patient continent of bowel and bladder, LBM 08/13/15  remain continent  assess q shift   Swallow/Nutrition/ Hydration   na         ADL's   min assist - supervision overall due to truncal and BUE ataxia  supervision overall  ADL retraining, RUE NMR, dynamic standing balance, pt/family education   Mobility   min guard/S gait with RW, min guard stairs, S transfers  S overall  stair training for home entry, endurance, dynamic standing balance, R NMR   Communication   moderate dysarthria, min assist for intelligibility in conversations   mod I   increased vocal intensity and slow rate; carryover of intelligibility strategies    Safety/Cognition/ Behavioral Observations  some unsafe behavior at times/bed alarm  min assist  continue to monitor   Pain   no c/o pain  min assist  continue to monitor q shift   Skin   no skin issues  min assist  monitor q shift      *See Care Plan and progress notes for long and short-term goals.  Barriers to Discharge: poor awareness of deficits    Possible Resolutions to Barriers:  caregiver training    Discharge Planning/Teaching Needs:  HOme with wife who can provide supervision level, pt anxious to know when he is going home.      Team Discussion:  Goals-supervision level and target discharge 4/7. Outing with TR today with wife. Carryover poor and will need assistance with finances and medication management. Has all equipment. Pt has a very flat affect, very matter of fact. Need wife to do more education tomorrow with therapies.  Revisions to Treatment Plan:  DC Friday    Continued Need for Acute Rehabilitation Level of Care: The patient requires daily medical management by a physician with specialized training in physical medicine and rehabilitation for the following conditions: Daily direction of a multidisciplinary physical rehabilitation program to ensure safe treatment while eliciting the highest outcome that is of practical value to the patient.: Yes Daily medical management of patient stability for increased activity during participation in an intensive rehabilitation regime.: Yes Daily analysis of laboratory values and/or radiology reports with any subsequent need for medication adjustment of medical intervention for : Neurological problems;Mood/behavior problems  [name] 08/15/2015, 12:56 PM

## 2015-08-15 NOTE — Progress Notes (Signed)
Subjective/Complaints: Patient without new issues overnight. Wife is pleased with his progress.  Review of systems negative for chest pain, shortness of breath, nausea vomiting diarrhea or constipation  Objective: Vital Signs: Blood pressure 135/61, pulse 74, temperature 97.7 F (36.5 C), temperature source Oral, resp. rate 18, SpO2 97 %. No results found. Results for orders placed or performed during the hospital encounter of 08/09/15 (from the past 72 hour(s))  Glucose, capillary     Status: Abnormal   Collection Time: 08/12/15 11:36 AM  Result Value Ref Range   Glucose-Capillary 154 (H) 65 - 99 mg/dL  Glucose, capillary     Status: Abnormal   Collection Time: 08/12/15  4:23 PM  Result Value Ref Range   Glucose-Capillary 152 (H) 65 - 99 mg/dL  Glucose, capillary     Status: Abnormal   Collection Time: 08/12/15  9:01 PM  Result Value Ref Range   Glucose-Capillary 147 (H) 65 - 99 mg/dL  Glucose, capillary     Status: Abnormal   Collection Time: 08/13/15  5:46 AM  Result Value Ref Range   Glucose-Capillary 168 (H) 65 - 99 mg/dL  Glucose, capillary     Status: Abnormal   Collection Time: 08/13/15 12:00 PM  Result Value Ref Range   Glucose-Capillary 174 (H) 65 - 99 mg/dL   Comment 1 Notify RN   Glucose, capillary     Status: Abnormal   Collection Time: 08/13/15  4:13 PM  Result Value Ref Range   Glucose-Capillary 156 (H) 65 - 99 mg/dL   Comment 1 Notify RN   Glucose, capillary     Status: Abnormal   Collection Time: 08/13/15  9:05 PM  Result Value Ref Range   Glucose-Capillary 158 (H) 65 - 99 mg/dL  Glucose, capillary     Status: Abnormal   Collection Time: 08/14/15  6:50 AM  Result Value Ref Range   Glucose-Capillary 159 (H) 65 - 99 mg/dL  Glucose, capillary     Status: Abnormal   Collection Time: 08/14/15 12:06 PM  Result Value Ref Range   Glucose-Capillary 156 (H) 65 - 99 mg/dL   Comment 1 Notify RN   Glucose, capillary     Status: Abnormal   Collection Time:  08/14/15  4:22 PM  Result Value Ref Range   Glucose-Capillary 151 (H) 65 - 99 mg/dL  Glucose, capillary     Status: Abnormal   Collection Time: 08/14/15  8:39 PM  Result Value Ref Range   Glucose-Capillary 134 (H) 65 - 99 mg/dL  Glucose, capillary     Status: Abnormal   Collection Time: 08/15/15  6:39 AM  Result Value Ref Range   Glucose-Capillary 141 (H) 65 - 99 mg/dL       General: No acute distress Mood and affect are appropriate Heart: Regular rate and rhythm no rubs murmurs or extra sounds Lungs: Clear to auscultation, breathing unlabored, no rales or wheezes Abdomen: Positive bowel sounds, soft nontender to palpation, nondistended Extremities: No clubbing, cyanosis, or edema Skin: No evidence of breakdown, no evidence of rash Neurologic: Cranial nerves II through XII intact, motor strength is 5/5 in bilateral deltoid, bicep, tricep, grip, hip flexor, knee extensors, ankle dorsiflexor and plantar flexor Sensory exam normal sensation to light touch and proprioception in bilateral upper and lower extremities Cerebellar exam normal finger to nose to finger as well as heel to shin in L upper and lower extremities, Mild to moderate dysmetria right upper extremity Musculoskeletal: Full range of motion in all 4 extremities. No  joint swelling    Assessment/Plan: 1. 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 Physiatrist is providing close team supervision and 24 hour management of active medical problems listed below. Physiatrist and rehab team continue to assess barriers to discharge/monitor patient progress toward functional and medical goals. FIM: Function - Bathing Position: Shower Body parts bathed by patient: Right arm, Left arm, Chest, Abdomen, Front perineal area, Buttocks, Right  upper leg, Left upper leg, Right lower leg, Left lower leg, Back Assist Level: Supervision or verbal cues  Function- Upper Body Dressing/Undressing What is the patient wearing?: Button up shirt Button up shirt - Perfomed by patient: Thread/unthread right sleeve, Thread/unthread left sleeve, Pull shirt around back, Button/unbutton shirt Assist Level: Set up, More than reasonable time Function - Lower Body Dressing/Undressing What is the patient wearing?: Shoes, [employer], Pants, Underwear Position: Sitting EOB Underwear - Performed by patient: Thread/unthread right underwear leg, Thread/unthread left underwear leg, Pull underwear up/down Pants- Performed by patient: Thread/unthread right pants leg, Thread/unthread left pants leg, Pull pants up/down Shoes - Performed by patient: Don/doff right shoe, Don/doff left shoe, Fasten right, Fasten left TED Hose - Performed by patient: Don/doff right TED hose, Don/doff left TED hose TED Hose - Performed by helper: Don/doff right TED hose, Don/doff left TED hose Assist for lower body dressing: Supervision or verbal cues  Function - Toileting Toileting steps completed by patient: Adjust clothing prior to toileting, Performs perineal hygiene, Adjust clothing after toileting Toileting Assistive Devices: Grab bar or rail Assist level: Supervision or verbal cues  Function - Air cabin crew transfer assistive device: Pension scheme manager level to toilet: Supervision or verbal cues Assist level from toilet: Supervision or verbal cues  Function - Chair/bed transfer Chair/bed transfer method: Stand pivot Chair/bed transfer assist level: Supervision or verbal cues Chair/bed transfer assistive device: Environmental consultant, Armrests Chair/bed transfer details: Verbal cues for precautions/safety, Verbal cues for safe use of DME/AE, Verbal cues for technique  Function - Locomotion: Wheelchair Will patient use wheelchair at discharge?: No Function - Locomotion:  Ambulation Assistive device: Walker-rolling Max distance: 200 ft Assist level: Supervision or verbal cues Assist level: Supervision or verbal cues Assist level: Supervision or verbal cues Assist level: Supervision or verbal cues Assist level: Moderate assist (Pt 50 - 74%)  Function - Comprehension Comprehension: Auditory Comprehension assist level: Follows complex conversation/direction with no assist  Function - Expression Expression: Verbal Expression assist level: Expresses basic 90% of the time/requires cueing < 10% of the time.  Function - Social Interaction Social Interaction assist level: Interacts appropriately 75 - 89% of the time - Needs redirection for appropriate language or to initiate interaction.  Function - Problem Solving Problem solving assist level: Solves basic 75 - 89% of the time/requires cueing 10 - 24% of the time  Function - Memory Memory assist level: Recognizes or recalls 75 - 89% of the time/requires cueing 10 - 24% of the time Patient normally able to recall (first 3 days only): Current season, That he or she is in a hospital  Medical Problem List and Plan: 1.  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 Formulized individual treatment plan based on medical history, underlying problem and comorbidities. 2.  DVT Prophylaxis/Anticoagulation: Subcutaneous heparin. Monitor platelet counts and any signs of bleeding,Continue subcutaneous Lovenox 40 mg per day 3. Pain Management: Tylenol as needed 4. CAD/hypertension. Cardizem 360 mg daily. Monitor with increased mobility, cont current dose 5. Neuropsych: This patient is capable of making decisions on his own behalf. 6. Skin/Wound Care: Routine skin checks 7.  Fluids/Electrolytes/Nutrition: Routine I&O with follow-up chemistries 8. Diabetes mellitus with peripheral neuropathy. Hemoglobin A1c 8.2. Improved on Levemir  20 units twice a day Check blood sugars before meals and at bedtime CBG (last 3)   Recent Labs  08/14/15 1622 08/14/15 2039 08/15/15 0639  GLUCAP 151* 134* 141*    9. Alcohol abuse. Monitor for any signs of withdrawal, No signs of DTs 10. Hyperlipidemia. Zocor LOS (Days) 6 A FACE TO FACE EVALUATION WAS PERFORMED  Gurshaan Matsuoka E 08/15/2015, 8:54 AM

## 2015-08-15 NOTE — Progress Notes (Signed)
Occupational Therapy Session Note  Patient Details  Name: Jose Beck MRN: [id number] Date of Birth: 1946-03-16  Today's Date: 08/15/2015 OT Individual Time: 1030-1230 OT Individual Time Calculation (min): 120 min    Short Term Goals: Week 1:  OT Short Term Goal 1 (Week 1): Pt will complete all dressing sit to stand with supervison 3 consecutive sessions.  OT Short Term Goal 2 (Week 1): Pt will complete all bathing shower level with supervision 3 consecutive sessions.  OT Short Term Goal 3 (Week 1): Pt will perform walk-in shower transfers with supervision using RW and shower seat.  OT Short Term Goal 4 (Week 1): Pt will perform RUE coordination exercises following handout with independence.  OT Short Term Goal 5 (Week 1): Pt will perform all toilet transfers and toileting using the RW and elevated toilet with supervision.   Skilled Therapeutic Interventions/Progress Updates:    Pt seen for community outing with cotx with recreation therapist with a focus on communication, problem solving, memory, and mobility skills.  Pt taken via rehab van to [employer]. Education with pt in [place] regarding goals of the outing.  His spouse met [place] there. Pt was able to climb steep stair into front seat of van with min A to steady balance. Close S with RW to step up onto curb, assist to fully manage heavy swing door into restaurant.  In restaurant prior to ordering, pt ambulated with RW with S to men's room to wash hands. Min A to manage heavy swing door.  Sat in booth with S.  During lunch, min cues to prompt conversation and he was understandable about 50% of the time. He was able to engage socially at an appropriate level with head nods and smiles.  He did need min A to calculate tip and had great difficulty with writing intelligibility on receipt.  A great deal of time was spent with education with pt and spouse regarding, level of S at home, set up of pt at home if wife needs to run a quick errand, no  driving recommendations, evaluation of driving skills after 6 months, R hand coordination exercises, and cognitive exercises using his computer at home. On ride back, education with pt on how he accomplished goals and things to continue to work on.  At return of hospital, pt ambulated back to room (well over 300 feet) with S with RW.  Pt relaxing in recliner with all needs met.  Refer to community outing sheet for specific goals.  Therapy Documentation Precautions:  Precautions Precautions: Fall Precaution Comments: RUE ataxia, trunk/pelvis ataxia with dynamic balance Restrictions Weight Bearing Restrictions: No      Pain: Pain Assessment Pain Assessment: No/denies pain ADL:   See Function Navigator for Current Functional Status.   Therapy/Group: Individual Therapy  [place] 08/15/2015, 1:09 PM

## 2015-08-15 NOTE — Progress Notes (Signed)
Social Work Patient ID: Jose Beck, male   DOB: 08/27/1945, 70 y.o.   MRN: [id number] Met with pt and wife to discuss team conference goals-supervision level and target discharge date of 4/7. Discussed outpatient rehab follow up and has all equipment. Both are feeling he is ready to go home on Friday. Wife is trying to get some of her appointments done before he comes home. She will be in tomorrow and will answer any questions then. Work toward discharge Friday.

## 2015-08-15 NOTE — Progress Notes (Signed)
Physical Therapy Session Note  Patient Details  Name: Jose Beck MRN: [id number] Date of Birth: Dec 17, 1945  Today's Date: 08/15/2015 PT Individual Time: 1445-1530 PT Individual Time Calculation (min): 45 min   Short Term Goals: Week 1:  PT Short Term Goal 1 (Week 1): Pt will perform stand pivot transfer with S and min verbal cues PT Short Term Goal 2 (Week 1): Pt will ambulate x150' CGA with LRAD PT Short Term Goal 3 (Week 1): Pt will perform ascent/descent of 4 6-inch stairs with 1 handrail PT Short Term Goal 4 (Week 1): Pt will negotiate 1 curb step with min guard and RW  Skilled Therapeutic Interventions/Progress Updates:    Pt received seated in recliner, denies pain and agreeable to treatment. Gait to/from gym 2x200' with RW and S, 1-2 minor LOB per trial, recovered without assist. Gait training with large base quad cane 2x90' with min guard decreased to close S. Cues for step length, sequencing with cane, and overall significantly decreased gait speed compared to gait with RW. Discussed with pt that he will likely not be ready to ambulate with quad cane by the time he goes home, however that will be the next step as he practices and becomes more comfortable with the cane. Litegait x3 min with total 150' S, cues for R step length and pt fatigues easily. Returned to room as above and remained seated in recliner at completion of session, all needs within reach.   Therapy Documentation Precautions:  Precautions Precautions: Fall Precaution Comments: RUE ataxia, trunk/pelvis ataxia with dynamic balance Restrictions Weight Bearing Restrictions: No Pain: Pain Assessment Pain Assessment: No/denies pain   See Function Navigator for Current Functional Status.   Therapy/Group: Individual Therapy  Luberta Mutter 08/15/2015, 3:41 PM

## 2015-08-16 ENCOUNTER — Inpatient Hospital Stay (HOSPITAL_COMMUNITY): Payer: Medicare Other | Admitting: Occupational Therapy

## 2015-08-16 ENCOUNTER — Inpatient Hospital Stay (HOSPITAL_COMMUNITY): Payer: Medicare Other | Admitting: Physical Therapy

## 2015-08-16 ENCOUNTER — Inpatient Hospital Stay (HOSPITAL_COMMUNITY): Payer: Medicare Other | Admitting: Speech Pathology

## 2015-08-16 DIAGNOSIS — I63349 Cerebral infarction due to thrombosis of unspecified cerebellar artery: Secondary | ICD-10-CM

## 2015-08-16 LAB — GLUCOSE, CAPILLARY
GLUCOSE-CAPILLARY: 164 mg/dL — AB (ref 65–99)
GLUCOSE-CAPILLARY: 145 mg/dL — AB (ref 65–99)
Glucose-Capillary: 137 mg/dL — ABNORMAL HIGH (ref 65–99)
Glucose-Capillary: 139 mg/dL — ABNORMAL HIGH (ref 65–99)

## 2015-08-16 NOTE — Discharge Summary (Signed)
NAMEMarland Beck  LAROD, BARGMAN NO.:  [id number]  MEDICAL RECORD NO.:  [id number]  LOCATION:  [id number]                        FACILITY:  [facility]  PHYSICIAN:  Charlett Blake, M.D.DATE OF BIRTH:  [birth year]  DATE OF ADMISSION:  08/09/2015 DATE OF DISCHARGE:  08/17/2015                              DISCHARGE SUMMARY   DISCHARGE DIAGNOSES: 1. Superior right cerebellar small acute infarct, contralateral     superior left cerebellar hemisphere. 2. Subcutaneous Lovenox for DVT prophylaxis. 3. Pain management. 4. Coronary artery disease. 5. Hypertension. 6. Diabetes mellitus, peripheral neuropathy.  HISTORY OF PRESENT ILLNESS:  This is a 70 year old right-handed male, resident of [place] with history of hypertension, coronary artery disease, TIA, and diabetes mellitus.  The patient was visiting in [place] on vacation.  Presented to [facility] August 06, 2015 with dizziness and slurred speech after being found down in the bathroom.  Denied any chest pain.  Noted blood pressure 238/130.  EKG normal sinus rhythm.  CT, MRI imaging showed subacute infarcts, superior right cerebellar hemisphere with small acute infarct contralateral superior left cerebellar hemisphere.  Occluded intracranial right vertebral artery.  Chest x-ray, negative.  CT angiogram of head and neck showed ulcerated atherosclerotic plaque, proximal aspect, right internal carotid artery with mild stenosis on the order of 15%-20%.  Also noted left vertebral artery with stenosis 30%. Mild hypokalemia 3.3.  TEE completed showing left ventricle mildly enlarged based on the left ventricular end diastolic volume.  Global hypokinesis.  Neurology consulted, maintained on aspirin and Plavix therapy.  Subcutaneous heparin for DVT prophylaxis.  Close monitoring of blood pressure.  The patient was admitted for comprehensive rehab program.  PAST MEDICAL HISTORY:  See discharge  diagnoses.  SOCIAL HISTORY:  Lives with wife in [place] 1-level home, 2 steps to entry.  Independent prior to admission.  Functional status upon admission to rehab service was with sit to stand with rolling walker, min mod assist.  Min mod assist with activities of daily living.  PHYSICAL EXAMINATION:  VITAL SIGNS:  Blood pressure 128/70, pulse 80, respirations 18, and temperature 99. GENERAL:  This was an alert male, oriented x3.  Speech mildly dysarthric but intelligible, fair but limited awareness of deficits. LUNGS:  Clear to auscultation without wheeze. CARDIAC:  Regular rate and rhythm.  No murmur. ABDOMEN:  Soft, nontender.  Good bowel sounds.  REHABILITATION HOSPITAL COURSE:  The patient was admitted to inpatient rehab services with therapies initiated on a 3-hour daily basis consisting of physical therapy, occupational therapy, speech therapy, and rehabilitation nursing.  The following issues were addressed during the patient's rehabilitation stay.  Pertaining to Mr. Welp' right cerebellar and small acute infarct, contralateral superior left cerebellar hemisphere, remained stable, maintained on aspirin and Plavix therapy.  He was attending full therapies.  Subcutaneous Lovenox for DVT prophylaxis, no bleeding episodes.  He did have a history of coronary artery disease, hypertension, maintained on Cardizem.  No chest pain or shortness of breath.  No orthostatic changes.  Diabetes mellitus, peripheral neuropathy, hemoglobin A1c of 8.2, currently maintained on Levemir insulin 22 units b.i.d., diabetic teaching. Patient had been on Glucophage 500 mg daily prior to  admission. Blood sugars 137-178.  No swallowing difficulties. Zocor as patient had been on in the past.  The patient received weekly collaborative interdisciplinary team conferences to discuss estimated length of stay, family teaching, any barriers to his discharge.  He was ambulating 220 feet x2 with  a rolling walker and supervision.  Gait training with large base quad cane, close supervision.  The patient showed good safety awareness.  Close supervision to step over a curb. Day passes went well with staff.  He was able to engage socially at an appropriate level.  He gathered his belongings for activities of daily living and homemaking was discussed with family.  No driving for right now.  The patient was discharged to home with ongoing therapies dictated per [facility].  DISCHARGE MEDICATIONS: 1. Aspirin 81 mg p.o. daily. 2. Lipitor 40 mg p.o. daily. 3. Plavix 75 mg p.o. daily. 4. Cardizem CD 360 mg p.o. daily. 5. Levemir 22 units subcutaneous b.i.d. 6. Protonix 40 mg p.o. daily.  DIET:  Diabetic diet. Special instructions. Patient will follow up with PCP Dr. [name] to discuss present use of insulin therapy and possibilities of resuming oral agents  FOLLOWUP:  He would follow up with Dr. Alysia Penna at the outpatient rehab center as directed; Dr. [name] medical management. Patient followed Dr. Leonie Man of neurology services 09/17/2015  SPECIAL INSTRUCTIONS:  No driving.  Arrangements to be made for outpatient Neurology followup.     Lauraine Rinne, P.A.   ______________________________ Charlett Blake, M.D.    DA/MEDQ  D:  08/16/2015  T:  08/16/2015  Job:  [id number]  cc:   [name], MD Charlett Blake, M.D.

## 2015-08-16 NOTE — Progress Notes (Signed)
Subjective/Complaints: Patient without complaints today. I spoke to his wife about his diabetic management. He will need to go home on long-acting insulin  Review of systems negative for chest pain, shortness of breath, nausea vomiting diarrhea or constipation  Objective: Vital Signs: Blood pressure 156/87, pulse 82, temperature 98.4 F (36.9 C), temperature source Oral, resp. rate 18, weight 126.8 kg (279 lb 8.7 oz), SpO2 97 %. No results found. Results for orders placed or performed during the hospital encounter of 08/09/15 (from the past 72 hour(s))  Glucose, capillary     Status: Abnormal   Collection Time: 08/13/15 12:00 PM  Result Value Ref Range   Glucose-Capillary 174 (H) 65 - 99 mg/dL   Comment 1 Notify RN   Glucose, capillary     Status: Abnormal   Collection Time: 08/13/15  4:13 PM  Result Value Ref Range   Glucose-Capillary 156 (H) 65 - 99 mg/dL   Comment 1 Notify RN   Glucose, capillary     Status: Abnormal   Collection Time: 08/13/15  9:05 PM  Result Value Ref Range   Glucose-Capillary 158 (H) 65 - 99 mg/dL  Glucose, capillary     Status: Abnormal   Collection Time: 08/14/15  6:50 AM  Result Value Ref Range   Glucose-Capillary 159 (H) 65 - 99 mg/dL  Glucose, capillary     Status: Abnormal   Collection Time: 08/14/15 12:06 PM  Result Value Ref Range   Glucose-Capillary 156 (H) 65 - 99 mg/dL   Comment 1 Notify RN   Glucose, capillary     Status: Abnormal   Collection Time: 08/14/15  4:22 PM  Result Value Ref Range   Glucose-Capillary 151 (H) 65 - 99 mg/dL  Glucose, capillary     Status: Abnormal   Collection Time: 08/14/15  8:39 PM  Result Value Ref Range   Glucose-Capillary 134 (H) 65 - 99 mg/dL  Glucose, capillary     Status: Abnormal   Collection Time: 08/15/15  6:39 AM  Result Value Ref Range   Glucose-Capillary 141 (H) 65 - 99 mg/dL  Glucose, capillary     Status: Abnormal   Collection Time: 08/15/15 10:38 AM  Result Value Ref Range   Glucose-Capillary 165 (H) 65 - 99 mg/dL  Glucose, capillary     Status: Abnormal   Collection Time: 08/15/15 12:46 PM  Result Value Ref Range   Glucose-Capillary 178 (H) 65 - 99 mg/dL  Glucose, capillary     Status: Abnormal   Collection Time: 08/15/15  4:51 PM  Result Value Ref Range   Glucose-Capillary 158 (H) 65 - 99 mg/dL  Glucose, capillary     Status: Abnormal   Collection Time: 08/15/15  8:56 PM  Result Value Ref Range   Glucose-Capillary 150 (H) 65 - 99 mg/dL  Glucose, capillary     Status: Abnormal   Collection Time: 08/16/15  6:45 AM  Result Value Ref Range   Glucose-Capillary 137 (H) 65 - 99 mg/dL       General: No acute distress Mood and affect are appropriate Heart: Regular rate and rhythm no rubs murmurs or extra sounds Lungs: Clear to auscultation, breathing unlabored, no rales or wheezes Abdomen: Positive bowel sounds, soft nontender to palpation, nondistended Extremities: No clubbing, cyanosis, or edema Skin: No evidence of breakdown, no evidence of rash Neurologic: Cranial nerves II through XII intact, motor strength is 5/5 in bilateral deltoid, bicep, tricep, grip, hip flexor, knee extensors, ankle dorsiflexor and plantar flexor Sensory exam normal sensation to  light touch and proprioception in bilateral upper and lower extremities Cerebellar exam normal finger to nose to finger as well as heel to shin in L upper and lower extremities, Mild to moderate dysmetria right upper extremity Musculoskeletal: Full range of motion in all 4 extremities. No joint swelling    Assessment/Plan: 1. 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 Physiatrist is providing close team supervision and 24 hour management of active medical problems listed below. Physiatrist and rehab team  continue to assess barriers to discharge/monitor patient progress toward functional and medical goals. FIM: Function - Bathing Position: Shower Body parts bathed by patient: Right arm, Left arm, Chest, Abdomen, Front perineal area, Buttocks, Right upper leg, Left upper leg, Right lower leg, Left lower leg, Back Assist Level: Supervision or verbal cues  Function- Upper Body Dressing/Undressing What is the patient wearing?: Button up shirt Button up shirt - Perfomed by patient: Thread/unthread right sleeve, Thread/unthread left sleeve, Pull shirt around back, Button/unbutton shirt Assist Level: Set up, More than reasonable time Function - Lower Body Dressing/Undressing What is the patient wearing?: Underwear, Pants, Shoes, Ted Hose Position: Sitting EOB Underwear - Performed by patient: Thread/unthread right underwear leg, Thread/unthread left underwear leg, Pull underwear up/down Pants- Performed by patient: Thread/unthread right pants leg, Thread/unthread left pants leg, Pull pants up/down Shoes - Performed by patient: Don/doff right shoe, Don/doff left shoe, Fasten right, Fasten left TED Hose - Performed by patient: Don/doff right TED hose, Don/doff left TED hose TED Hose - Performed by helper: Don/doff right TED hose, Don/doff left TED hose Assist for lower body dressing: Supervision or verbal cues, Set up  Function - Toileting Toileting steps completed by patient: Adjust clothing prior to toileting, Performs perineal hygiene, Adjust clothing after toileting Toileting Assistive Devices: Grab bar or rail Assist level: Supervision or verbal cues  Function - Air cabin crew transfer assistive device: Pension scheme manager level to toilet: Supervision or verbal cues Assist level from toilet: Supervision or verbal cues  Function - Chair/bed transfer Chair/bed transfer method: Stand pivot Chair/bed transfer assist level: Supervision or verbal cues Chair/bed transfer assistive device:  Environmental consultant, Armrests Chair/bed transfer details: Verbal cues for precautions/safety, Verbal cues for safe use of DME/AE, Verbal cues for technique  Function - Locomotion: Wheelchair Will patient use wheelchair at discharge?: No Function - Locomotion: Ambulation Assistive device: Lite gait Max distance: 150 Assist level: Supervision or verbal cues Assist level: Supervision or verbal cues Assist level: Supervision or verbal cues Assist level: Supervision or verbal cues Assist level: Moderate assist (Pt 50 - 74%)  Function - Comprehension Comprehension: Auditory Comprehension assist level: Follows basic conversation/direction with extra time/assistive device  Function - Expression Expression: Verbal Expression assist level: Expresses basic 75 - 89% of the time/requires cueing 10 - 24% of the time. Needs helper to occlude trach/needs to repeat words.  Function - Social Interaction Social Interaction assist level: Interacts appropriately 75 - 89% of the time - Needs redirection for appropriate language or to initiate interaction.  Function - Problem Solving Problem solving assist level: Solves basic 75 - 89% of the time/requires cueing 10 - 24% of the time  Function - Memory Memory assist level: Recognizes or recalls 75 - 89% of the time/requires cueing 10 - 24% of the time Patient normally able to recall (first 3 days only): Current season, That he or she is in  a hospital  Medical Problem List and Plan: 1.  Dizziness and slurred speech secondary to superior right cerebellar with small acute infarct contralateral superior left cerebellar hemisphere-should be ready for discharge in the morning2.  DVT Prophylaxis/Anticoagulation: Subcutaneous heparin. Monitor platelet counts and any signs of bleeding,Continue subcutaneous Lovenox 40 mg per day 3. Pain Management: Tylenol as needed 4. CAD/hypertension. Cardizem 360 mg daily. Monitor with increased mobility, cont current dose 5. Neuropsych:  This patient is capable of making decisions on his own behalf. 6. Skin/Wound Care: Routine skin checks 7. Fluids/Electrolytes/Nutrition: Routine I&O with follow-up chemistries 8. Diabetes mellitus with peripheral neuropathy. Hemoglobin A1c 8.2. We will discharge on Levemir  20 units twice a day , we'll need to train wife and patient on administrationCheck blood sugars before meals and at bedtime CBG (last 3)   Recent Labs  08/15/15 1651 08/15/15 2056 08/16/15 0645  GLUCAP 158* 150* 137*    9. Alcohol abuse. Monitor for any signs of withdrawal, No signs of DTs 10. Hyperlipidemia. Zocor LOS (Days) 7 A FACE TO FACE EVALUATION WAS PERFORMED  Abiola Behring E 08/16/2015, 9:00 AM

## 2015-08-16 NOTE — Discharge Instructions (Signed)
Inpatient Rehab Discharge Instructions  Jose Beck Discharge date and time: No discharge date for patient encounter.   Activities/Precautions/ Functional Status: Activity: activity as tolerated Diet: diabetic diet Wound Care: none needed Functional status:  ___ No restrictions     ___ Walk up steps independently ___ 24/7 supervision/assistance   ___ Walk up steps with assistance ___ Intermittent supervision/assistance  ___ Bathe/dress independently ___ Walk with walker     __x_ Bathe/dress with assistance ___ Walk Independently    ___ Shower independently ___ Walk with assistance    ___ Shower with assistance ___ No alcohol     ___ Return to work/school ________  Special Instructions: No driving   COMMUNITY REFERRALS UPON DISCHARGE:    Outpatient: PT, OT, SP  Agency:[facility] [phone]   Date of Last Service:08/17/2015  Appointment Date/Time:APRIL 11 Tuesday 8:15-10:15 AM THEN April 18 Tuesday 8:45-9:30 AM  Medical Equipment/Items Ordered:HAS NEEDED EQUIPMENT FROM PREVIOUS ADMISSIONS    GENERAL COMMUNITY RESOURCES FOR PATIENT/FAMILY: Support Groups:[facility] SUPPORT GROUP  EVERY SECOND Thursday @ 3:00-4:00 PM ON THE REHAB UNIT QUESTIONS CONTACT KATIE  [id number]  STROKE/TIA DISCHARGE INSTRUCTIONS SMOKING Cigarette smoking nearly doubles your risk of having a stroke & is the single most alterable risk factor  If you smoke or have smoked in the last 12 months, you are advised to quit smoking for your health.  Most of the excess cardiovascular risk related to smoking disappears within a year of stopping.  Ask you doctor about anti-smoking medications   Quit Line: 1-800-QUIT NOW  Free Smoking Cessation Classes [phone]  CHOLESTEROL Know your levels; limit fat & cholesterol in your diet  Lipid Panel  No results found for: CHOL, TRIG, HDL, CHOLHDL, VLDL, LDLCALC    Many patients benefit from treatment even if their cholesterol is at  goal.  Goal: Total Cholesterol (CHOL) less than 160  Goal:  Triglycerides (TRIG) less than 150  Goal:  HDL greater than 40  Goal:  LDL (LDLCALC) less than 100   BLOOD PRESSURE American Stroke Association blood pressure target is less that 120/80 mm/Hg  Your discharge blood pressure is:  BP: 135/61 mmHg  Monitor your blood pressure  Limit your salt and alcohol intake  Many individuals will require more than one medication for high blood pressure  DIABETES (A1c is a blood sugar average for last 3 months) Goal HGBA1c is under 7% (HBGA1c is blood sugar average for last 3 months)  Diabetes:     No results found for: HGBA1C   Your HGBA1c can be lowered with medications, healthy diet, and exercise.  Check your blood sugar as directed by your physician  Call your physician if you experience unexplained or low blood sugars.  PHYSICAL ACTIVITY/REHABILITATION Goal is 30 minutes at least 4 days per week  Activity: Increase activity slowly, Therapies: Physical Therapy: Home Health Return to work:   Activity decreases your risk of heart attack and stroke and makes your heart stronger.  It helps control your weight and blood pressure; helps you relax and can improve your mood.  Participate in a regular exercise program.  Talk with your doctor about the best form of exercise for you (dancing, walking, swimming, cycling).  DIET/WEIGHT Goal is to maintain a healthy weight  Your discharge diet is: Diet Carb Modified Fluid consistency:: Thin; Room service appropriate?: Yes  liquids Your height is:    Your current weight is:   Your Body Mass Index (BMI) is:     Following the type  of diet specifically designed for you will help prevent another stroke.  Your goal weight range is:    Your goal Body Mass Index (BMI) is 19-24.  Healthy food habits can help reduce 3 risk factors for stroke:  High cholesterol, hypertension, and excess weight.  RESOURCES Stroke/Support Group:  Call [phone]    STROKE EDUCATION PROVIDED/REVIEWED AND GIVEN TO PATIENT Stroke warning signs and symptoms How to activate emergency medical system (call 911). Medications prescribed at discharge. Need for follow-up after discharge. Personal risk factors for stroke. Pneumonia vaccine given:  Flu vaccine given:  My questions have been answered, the writing is legible, and I understand these instructions.  I will adhere to these goals & educational materials that have been provided to me after my discharge from the hospital.       My questions have been answered and I understand these instructions. I will adhere to these goals and the provided educational materials after my discharge from the hospital.  Patient/Caregiver Signature _______________________________ Date __________  Clinician Signature _______________________________________ Date __________  Please bring this form and your medication list with you to all your follow-up doctor's appointments.

## 2015-08-16 NOTE — Progress Notes (Signed)
Physical Therapy Discharge Summary  Patient Details  Name: Jose Beck MRN: [id number] Date of Birth: 1945/07/10  Today's Date: 08/16/2015 PT Individual Time: 1130-1200 and 1415-1430 PT Individual Time Calculation (min): 30 min and 15 min (total 45 min)   Patient has met 10 of 10 long term goals due to improved activity tolerance, improved balance, improved postural control, ability to compensate for deficits, functional use of  right upper extremity and right lower extremity, improved attention, improved awareness and improved coordination.  Patient to discharge at an ambulatory level Supervision.   Patient's care partner is independent to provide the necessary physical and cognitive assistance at discharge.  Reasons goals not met: All goals met  Recommendation:  Patient will benefit from ongoing skilled PT services in outpatient setting to continue to advance safe functional mobility, address ongoing impairments in balance, coordination, activity tolerance, and minimize fall risk.  Equipment: No equipment provided  Reasons for discharge: treatment goals met  Patient/family agrees with progress made and goals achieved: Yes  PT Discharge Precautions/Restrictions Precautions Precautions: Fall Precaution Comments: RUE ataxia, trunk/pelvis ataxia with dynamic balance Vital Signs Therapy Vitals Temp: 98.8 F (37.1 C) Temp Source: Oral Pulse Rate: 96 Resp: 16 BP: (!) 143/79 mmHg Patient Position (if appropriate): Sitting Oxygen Therapy SpO2: 98 % Pain Pain Assessment Pain Assessment: No/denies pain Vision/Perception  Perception Comments: WFL  Cognition Overall Cognitive Status: Impaired/Different from baseline Arousal/Alertness: Awake/alert Orientation Level: Oriented X4 Attention: Alternating Alternating Attention: Appears intact Memory: Impaired Memory Impairment: Decreased recall of new information;Decreased short term memory Decreased Short Term Memory:  Verbal complex Awareness: Impaired Awareness Impairment: Intellectual impairment Problem Solving: Impaired Problem Solving Impairment: Functional complex;Verbal complex Executive Function: Self Monitoring;Self Correcting;Organizing Organizing: Impaired Organizing Impairment: Functional complex;Verbal complex Self Monitoring: Impaired Self Monitoring Impairment: Functional complex;Verbal complex Self Correcting: Impaired Self Correcting Impairment: Verbal complex;Functional complex Behaviors: Other (comment) (flat affect ) Safety/Judgment: Appears intact Sensation Sensation Light Touch: Appears Intact Stereognosis: Not tested Hot/Cold: Appears Intact Proprioception: Impaired Detail Proprioception Impaired Details: Impaired RUE Additional Comments: Pt with proprioceptive differences in the right hand/digits but intact in all other areas of the RUE.  Coordination Gross Motor Movements are Fluid and Coordinated: No Fine Motor Movements are Fluid and Coordinated: No Coordination and Movement Description: Pt with increased ataxia noted with RUE use during functional self care tasks.  He was however able to button his shirt with good proficiency.  Slight difficulty when reaching out with the RUE at full arms length to pick up small objects.  Finger Nose Finger Test: dysmetria RUE 9 Hole Peg Test: Rt: 1:14 and Lt: 55 seconds Motor  Motor Motor: Ataxia;Hemiplegia Motor - Discharge Observations: R hemiparesis, ataxia, improved coordination and safety without UE support  Mobility Bed Mobility Bed Mobility: Supine to Sit;Sit to Supine Supine to Sit: 6: Modified independent (Device/Increase time) Sit to Supine: 6: Modified independent (Device/Increase time) Transfers Transfers: Yes Stand Pivot Transfers: 6: Modified independent (Device/Increase time) Locomotion  Ambulation Ambulation: Yes Ambulation/Gait Assistance: 5: Supervision Ambulation Distance (Feet): 300 Feet Assistive device:  Rolling walker Ambulation/Gait Assistance Details: Verbal cues for precautions/safety;Verbal cues for technique;Verbal cues for gait pattern Gait Gait: Yes Gait Pattern: Impaired Gait Pattern: Ataxic;Poor foot clearance - right;Wide base of support;Decreased stride length Gait velocity: 2.0 ft/sec Stairs / Additional Locomotion Stairs: Yes Stairs Assistance: 5: Supervision Stairs Assistance Details: Verbal cues for precautions/safety;Verbal cues for technique Stair Management Technique: Two rails;Alternating pattern;Forwards Number of Stairs: 12 Height of Stairs: 6 Ramp: 5: Supervision Curb: 5: Supervision Wheelchair  Mobility Wheelchair Mobility: No  Trunk/Postural Assessment  Cervical Assessment Cervical Assessment: Exceptions to [facility] (forward head posture) Thoracic Assessment Thoracic Assessment: Exceptions to [facility] (increased thoracic kyphosis) Lumbar Assessment Lumbar Assessment: Exceptions to [facility] (reduced lumbar lordosis) Postural Control Postural Control: Deficits on evaluation Protective Responses: impaired stepping strategies for balance recovery  Balance Balance Balance Assessed: Yes Standardized Balance Assessment Standardized Balance Assessment: Berg Balance Test Berg Balance Test Sit to Stand: Able to stand  independently using hands Standing Unsupported: Able to stand 2 minutes with supervision Sitting with Back Unsupported but Feet Supported on Floor or Stool: Able to sit safely and securely 2 minutes Stand to Sit: Controls descent by using hands Transfers: Able to transfer safely, definite need of hands Standing Unsupported with Eyes Closed: Able to stand 10 seconds with supervision Standing Ubsupported with Feet Together: Able to place feet together independently but unable to hold for 30 seconds From Standing, Reach Forward with Outstretched Arm: Can reach forward >5 cm safely (2") From Standing Position, Pick up Object from Floor: Able to pick up shoe, needs  supervision From Standing Position, Turn to Look Behind Over each Shoulder: Needs supervision when turning Turn 360 Degrees: Needs close supervision or verbal cueing Standing Unsupported, Alternately Place Feet on Step/Stool: Able to complete >2 steps/needs minimal assist Standing Unsupported, One Foot in Front: Able to take small step independently and hold 30 seconds Standing on One Leg: Tries to lift leg/unable to hold 3 seconds but remains standing independently Total Score: 32 Timed Up and Go Test TUG: Normal TUG Normal TUG (seconds): 22 Static Sitting Balance Static Sitting - Balance Support: No upper extremity supported;Feet supported Static Sitting - Level of Assistance: 6: Modified independent (Device/Increase time) Dynamic Sitting Balance Dynamic Sitting - Balance Support: No upper extremity supported;Feet supported;During functional activity Dynamic Sitting - Level of Assistance: 6: Modified independent (Device/Increase time) Dynamic Sitting - Balance Activities: Lateral lean/weight shifting;Forward lean/weight shifting;Reaching for objects;Reaching across midline Static Standing Balance Static Standing - Balance Support: No upper extremity supported;During functional activity Static Standing - Level of Assistance: 5: Stand by assistance Static Stance: Eyes closed Static Stance: Eyes Closed: S x30 sec Dynamic Standing Balance Dynamic Standing - Balance Support: No upper extremity supported;During functional activity Dynamic Standing - Level of Assistance: 5: Stand by assistance Dynamic Standing - Balance Activities: Reaching for objects;Reaching across midline;Forward lean/weight shifting;Lateral lean/weight shifting Extremity Assessment  RUE Assessment RUE Assessment: Exceptions to [facility] (AROM WFL, ataxia in RUE during functional use but able to utilized as dominant UE despite ataxia during all self-care tasks) LUE Assessment LUE Assessment: Within Functional Limits RLE  Assessment RLE Assessment: Within Functional Limits LLE Assessment LLE Assessment: Within Functional Limits  Skilled Therapeutic Intervention: Tx 1: Pt received seated in recliner, denies pain and agreeable to treatment. Assessed gait and stairs as above. Nustep x10 min with BUE/BLE level 5 for endurance, strengthening. Therapist applied tennis balls to RW to reduce dragging and improve functionality. Returned to room with gait x150' RW and S. Remained seated in recliner at completion of session, all needs within reach.  Tx 2: Pt received seated in recliner, denies pain and agreeable to treatment. Assessed floor transfer, car transfer, bed and furniture transfer as above with S overall. Discussed d/c plan with wife including follow up therapy, goal for working away from RW towards cane and no AD. Wife and pt with no additional questions or concerns regarding d/c. Remained seated in recliner at completion of session, all needs within reach.   See  Function Navigator for Current Functional Status.  Benjiman Core Tygielski 08/16/2015, 3:39 PM

## 2015-08-16 NOTE — Progress Notes (Signed)
Occupational Therapy Discharge Summary  Patient Details  Name: Jose Beck MRN: [id number] Date of Birth: [birth year]  Patient has met 11 of 11 long term goals due to improved activity tolerance, improved balance, postural control, ability to compensate for deficits, functional use of  RIGHT upper extremity, improved awareness and improved coordination.  Patient to discharge at overall Supervision level.  Patient's care partner is independent to provide the necessary cognitive assistance at discharge.    Reasons goals not met: N/A  Recommendation:  Patient will benefit from ongoing skilled OT services in outpatient setting to continue to advance functional skills in the area of BADL, iADL and Reduce care partner burden.  Equipment: No equipment provided  Reasons for discharge: treatment goals met and discharge from hospital  Patient/family agrees with progress made and goals achieved: Yes  OT Discharge Precautions/Restrictions  Precautions Precautions: Fall Precaution Comments: RUE ataxia, trunk/pelvis ataxia with dynamic balance Pain Pain Assessment Pain Assessment: No/denies pain ADL  See Function Navigator Vision/Perception  Vision- History Baseline Vision/History: Wears glasses Wears Glasses: At all times Patient Visual Report: No change from baseline Vision- Assessment Vision Assessment?: No apparent visual deficits Perception Comments: WFL  Cognition Overall Cognitive Status: Impaired/Different from baseline Arousal/Alertness: Awake/alert Orientation Level: Oriented X4 Attention: Alternating Alternating Attention: Appears intact Memory: Impaired Memory Impairment: Decreased recall of new information;Decreased short term memory Decreased Short Term Memory: Verbal complex Awareness: Impaired Awareness Impairment: Intellectual impairment Problem Solving: Impaired Problem Solving Impairment: Functional complex;Verbal complex Executive Function: Self  Monitoring;Self Correcting;Organizing Organizing: Impaired Organizing Impairment: Functional complex;Verbal complex Self Monitoring: Impaired Self Monitoring Impairment: Functional complex;Verbal complex Self Correcting: Impaired Self Correcting Impairment: Verbal complex;Functional complex Behaviors: Other (comment) (flat affect ) Safety/Judgment: Appears intact Sensation Sensation Light Touch: Appears Intact Stereognosis: Not tested Hot/Cold: Appears Intact Proprioception: Impaired Detail Proprioception Impaired Details: Impaired RUE Additional Comments: Pt with proprioceptive differences in the right hand/digits but intact in all other areas of the RUE.  Coordination Gross Motor Movements are Fluid and Coordinated: No Fine Motor Movements are Fluid and Coordinated: No Coordination and Movement Description: Pt with increased ataxia noted with RUE use during functional self care tasks.  He was however able to button his shirt with good proficiency.  Slight difficulty when reaching out with the RUE at full arms length to pick up small objects.  Finger Nose Finger Test: dysmetria RUE 9 Hole Peg Test: Rt: 1:14 and Lt: 55 seconds Extremity/Trunk Assessment RUE Assessment RUE Assessment: Exceptions to WFL (AROM WFL, ataxia in RUE during functional use but able to utilized as dominant UE despite ataxia during all self-care tasks) LUE Assessment LUE Assessment: Within Functional Limits   See Function Navigator for Current Functional Status.  Jose Beck 08/16/2015, 1:38 PM

## 2015-08-16 NOTE — Progress Notes (Signed)
Occupational Therapy Session Note  Patient Details  Name: Torien Emmett MRN: [id number] Date of Birth: 1946/02/09  Today's Date: 08/16/2015 OT Individual Time: 0830-1000 OT Individual Time Calculation (min): 90 min    Short Term Goals: Week 1:  OT Short Term Goal 1 (Week 1): Pt will complete all dressing sit to stand with supervison 3 consecutive sessions.  OT Short Term Goal 2 (Week 1): Pt will complete all bathing shower level with supervision 3 consecutive sessions.  OT Short Term Goal 3 (Week 1): Pt will perform walk-in shower transfers with supervision using RW and shower seat.  OT Short Term Goal 4 (Week 1): Pt will perform RUE coordination exercises following handout with independence.  OT Short Term Goal 5 (Week 1): Pt will perform all toilet transfers and toileting using the RW and elevated toilet with supervision.   Skilled Therapeutic Interventions/Progress Updates:    Completed ADL retraining at overall supervision/setup level.  Pt ambulated to bathroom with RW and supervision.  Completed toileting in standing without assist.  Bathing completed in room shower at sit > stand level with distant supervision.  Dressing completed at sit > stand level with increased time.  Pt attempted to don pants in standing, however sat at EOB to thread pant legs.  Therapist moved RW wheels to inside of frame to allow easier access through narrow doorways in home.  Engaged in simulated simple meal prep in kitchen with discussion of use of counter tops and delegating tasks if unable to complete safely.  Engaged in dynamic standing balance on Bi[employer] with limits of stability and random control.  Pt requiring increased time for weight shifting without UE support due to truncal ataxia. Engaged in dynamic standing on foam with focus on trunk control and RUE control with reaching and fine motor task at table top.  Completed 9 hole peg test Rt: 1:14 and Lt: 55 seconds.  Educated on energy conservation  strategies to increase safety.  Therapy Documentation Precautions:  Precautions Precautions: Fall Precaution Comments: RUE ataxia, trunk/pelvis ataxia with dynamic balance Restrictions Weight Bearing Restrictions: No Pain:   Pt with no c/o pain  See Function Navigator for Current Functional Status.   Therapy/Group: Individual Therapy  Simonne Come 08/16/2015, 10:44 AM

## 2015-08-16 NOTE — Plan of Care (Signed)
Problem: RH Expression Communication Goal: LTG Patient will increase speech intelligibility (SLP) LTG: Patient will increase speech intelligibility at word/phrase/conversation level with cues, % of the time (SLP)  Outcome: Not Met (add Reason) Min assist needed for intelligibility at the conversational level   Problem: RH Awareness Goal: LTG: Patient will demonstrate intellectual/emergent (SLP) LTG: Patient will demonstrate intellectual/emergent/anticipatory awareness with assist during a cognitive/linguistic activity (SLP)  Outcome: Not Met (add Reason) Min assist for intellectual awareness of deficits

## 2015-08-16 NOTE — Progress Notes (Signed)
08/16/15 1438 nursing RN demonstrated insulin shots to wife; Video re: diabetes no 511 on educational channel watched by patient and wife; hand outs  re: insulin pen given to wife; Encouraged wife to ask questions.

## 2015-08-16 NOTE — Discharge Summary (Signed)
Discharge summary job [phone]

## 2015-08-16 NOTE — Progress Notes (Signed)
Social Work Patient ID: Jose Beck, male   DOB: 1946/03/08, 69 y.o.   MRN: [id number] Met with pt and wife to discuss discharge questions gave her rehab appointments and follow up PCP appointment. This helps her plan for next week. They have watched the diabetes video and have learned the BS checks and injections. Wife feels she has a good understanding of his BS and insulin. Discussed if felt needed a HHRN can switch follow up appointment to home health instead of OP. Both feel OP will be more benefical for him.  Both feel more prepared for discharge Tomorrow.

## 2015-08-16 NOTE — Progress Notes (Signed)
Speech Language Pathology Discharge Summary  Patient Details  Name: Jose Beck MRN: [id number] Date of Birth: 03-09-1946  Today's Date: 08/16/2015 SLP Individual Time: 1005-1100 SLP Individual Time Calculation (min): 55 min   Skilled Therapeutic Interventions:  Pt was seen for skilled ST targeting cognitive goals.  SLP facilitated the session with ongoing medication management tasks targeting functional problem solving for higher level familiar tasks.  Pt was able to load pills of varying dosages and frequencies into a pill box with mod I for 100% accuracy although he required more than a reasonable amount of time to complete task due to thoroughness.  SLP administered portions of the MoCA to measure progress from initial evaluation.  Pt initially did not recall having completed an alternate version of assessment when first evaluated on CIR; however, he did recall portions of assessment as they were completed and stated that he did better on his first evaluation.  Pt required mod assist verbal cues for organization and error awareness when completing the clock drawing subtest.  Pt was also only able to recall 2 out of 5 targeted words after a 5 minute delay, which improved to 3 out of 5 with min-mod assist category cues.  Pt's wife initially was present during therapy session but left before session ended.  SLP reviewed memory compensatory strategies with pt and provided him with a handout to facilitate carryover in the home environment given that wife was not present for education.  SLP also left wife a handwritten note detailing recommendations for ongoing ST at next level of care and that pt have assistance for medication and financial management at discharge due to ongoing cognitive deficits.  Pt left in recliner with call bell within reach.      Patient has met 2 of 5 long term goals.  Patient to discharge at overall Supervision level.  Reasons goals not met: min assist needed for  intellectual awareness of deficits and for intelligibility in conversations    Clinical Impression/Discharge Summary:  Pt made slow gains while inpatient and is discharging having met 2 out of 4 long term goals.  Pt currently requires supervision level assist for basic to semi-complex cognitive tasks due to decreased intellectual awareness of deficits, decreased retrieval of information, and decreased functional problem solving.  Pt also continues to present with a moderate dysarthria characterized by impaired coordination for articulation and decreased vocal intensity which impacts his speech intelligibility in sentences and conversations.  Pt with poor awareness and self regulation of communication breakdown resulting from dysarthria and requires min assist to achieve and maintain intelligibility.  Pt is discharging home with assistance from family and recommendations for ongoing ST at next level of care to continue to address cognitive and communication impairments.  Also recommend ongoing education at next level of care due to inconsistent family attendance during therapies.    Care Partner:  Caregiver Able to Provide Assistance: Yes  Type of Caregiver Assistance: Physical;Cognitive  Recommendation:  Outpatient SLP  Rationale for SLP Follow Up: Maximize functional communication;Maximize cognitive function and independence;Reduce caregiver burden   Equipment: none recommended by SLP    Reasons for discharge: Discharged from hospital   Patient/Family Agrees with Progress Made and Goals Achieved: Yes   Function:  Eating Eating                 Cognition Comprehension Comprehension assist level: Follows basic conversation/direction with no assist  Expression   Expression assist level: Expresses basic 75 - 89% of the time/requires cueing  10 - 24% of the time. Needs helper to occlude trach/needs to repeat words.  Social Interaction Social Interaction assist level: Interacts  appropriately 75 - 89% of the time - Needs redirection for appropriate language or to initiate interaction.  Problem Solving Problem solving assist level: Solves basic 75 - 89% of the time/requires cueing 10 - 24% of the time  Memory Memory assist level: Recognizes or recalls 75 - 89% of the time/requires cueing 10 - 24% of the time   Emilio Math 08/16/2015, 12:47 PM

## 2015-08-17 ENCOUNTER — Encounter (HOSPITAL_COMMUNITY): Payer: Self-pay | Admitting: *Deleted

## 2015-08-17 LAB — CBC WITH DIFFERENTIAL/PLATELET
BASOS PCT: 0 %
Basophils Absolute: 0 10*3/uL (ref 0.0–0.1)
EOS ABS: 0.1 10*3/uL (ref 0.0–0.7)
EOS PCT: 2 %
HCT: 42.9 % (ref 39.0–52.0)
Hemoglobin: 14.8 g/dL (ref 13.0–17.0)
LYMPHS ABS: 1 10*3/uL (ref 0.7–4.0)
LYMPHS PCT: 21 %
MCH: 33.4 pg (ref 26.0–34.0)
MCHC: 34.5 g/dL (ref 30.0–36.0)
MCV: 96.8 fL (ref 78.0–100.0)
MONO ABS: 0.6 10*3/uL (ref 0.1–1.0)
Monocytes Relative: 13 %
NEUTROS ABS: 2.9 10*3/uL (ref 1.7–7.7)
Neutrophils Relative %: 64 %
PLATELETS: 99 10*3/uL — AB (ref 150–400)
RBC: 4.43 MIL/uL (ref 4.22–5.81)
RDW: 12.6 % (ref 11.5–15.5)
WBC: 4.6 10*3/uL (ref 4.0–10.5)

## 2015-08-17 LAB — GLUCOSE, CAPILLARY: GLUCOSE-CAPILLARY: 142 mg/dL — AB (ref 65–99)

## 2015-08-17 MED ORDER — ATORVASTATIN CALCIUM 40 MG PO TABS: 40.0000 mg | ORAL_TABLET | Freq: Every day | ORAL | Status: DC

## 2015-08-17 MED ORDER — PANTOPRAZOLE SODIUM 40 MG PO TBEC: 40.0000 mg | DELAYED_RELEASE_TABLET | Freq: Every day | ORAL | Status: DC

## 2015-08-17 MED ORDER — ASPIRIN 81 MG PO CHEW: 81.0000 mg | CHEWABLE_TABLET | Freq: Every day | ORAL | Status: DC

## 2015-08-17 MED ORDER — DILTIAZEM HCL ER BEADS 360 MG PO CP24: 360.0000 mg | ORAL_CAPSULE | Freq: Every day | ORAL | Status: DC

## 2015-08-17 MED ORDER — INSULIN DETEMIR 100 UNIT/ML ~~LOC~~ SOLN: 22.0000 [IU] | Freq: Two times a day (BID) | SUBCUTANEOUS | Status: DC

## 2015-08-17 MED ORDER — CYANOCOBALAMIN 100 MCG PO TABS: 100.0000 ug | ORAL_TABLET | Freq: Every day | ORAL | Status: DC

## 2015-08-17 MED ORDER — FOLIC ACID 1 MG PO TABS: 1.0000 mg | ORAL_TABLET | Freq: Every day | ORAL | Status: DC

## 2015-08-17 MED ORDER — DILTIAZEM HCL ER COATED BEADS 360 MG PO CP24: 360.0000 mg | ORAL_CAPSULE | Freq: Every day | ORAL | Status: DC

## 2015-08-17 MED ORDER — CLOPIDOGREL BISULFATE 75 MG PO TABS: 75.0000 mg | ORAL_TABLET | Freq: Every day | ORAL | Status: DC

## 2015-08-17 NOTE — Progress Notes (Signed)
Subjective/Complaints:  He will need to go home on long-acting insulin Discussed need for diabetic diet  Review of systems negative for chest pain, shortness of breath, nausea vomiting diarrhea or constipation  Objective: Vital Signs: Blood pressure 142/86, pulse 83, temperature 98.3 F (36.8 C), temperature source Oral, resp. rate 18, weight 126.8 kg (279 lb 8.7 oz), SpO2 97 %. No results found. Results for orders placed or performed during the hospital encounter of 08/09/15 (from the past 72 hour(s))  Glucose, capillary     Status: Abnormal   Collection Time: 08/14/15 12:06 PM  Result Value Ref Range   Glucose-Capillary 156 (H) 65 - 99 mg/dL   Comment 1 Notify RN   Glucose, capillary     Status: Abnormal   Collection Time: 08/14/15  4:22 PM  Result Value Ref Range   Glucose-Capillary 151 (H) 65 - 99 mg/dL  Glucose, capillary     Status: Abnormal   Collection Time: 08/14/15  8:39 PM  Result Value Ref Range   Glucose-Capillary 134 (H) 65 - 99 mg/dL  Glucose, capillary     Status: Abnormal   Collection Time: 08/15/15  6:39 AM  Result Value Ref Range   Glucose-Capillary 141 (H) 65 - 99 mg/dL  Glucose, capillary     Status: Abnormal   Collection Time: 08/15/15 10:38 AM  Result Value Ref Range   Glucose-Capillary 165 (H) 65 - 99 mg/dL  Glucose, capillary     Status: Abnormal   Collection Time: 08/15/15 12:46 PM  Result Value Ref Range   Glucose-Capillary 178 (H) 65 - 99 mg/dL  Glucose, capillary     Status: Abnormal   Collection Time: 08/15/15  4:51 PM  Result Value Ref Range   Glucose-Capillary 158 (H) 65 - 99 mg/dL  Glucose, capillary     Status: Abnormal   Collection Time: 08/15/15  8:56 PM  Result Value Ref Range   Glucose-Capillary 150 (H) 65 - 99 mg/dL  Glucose, capillary     Status: Abnormal   Collection Time: 08/16/15  6:45 AM  Result Value Ref Range   Glucose-Capillary 137 (H) 65 - 99 mg/dL  Glucose, capillary     Status: Abnormal   Collection Time: 08/16/15  11:41 AM  Result Value Ref Range   Glucose-Capillary 164 (H) 65 - 99 mg/dL  Glucose, capillary     Status: Abnormal   Collection Time: 08/16/15  5:06 PM  Result Value Ref Range   Glucose-Capillary 145 (H) 65 - 99 mg/dL  Glucose, capillary     Status: Abnormal   Collection Time: 08/16/15  8:29 PM  Result Value Ref Range   Glucose-Capillary 139 (H) 65 - 99 mg/dL  Glucose, capillary     Status: Abnormal   Collection Time: 08/17/15  6:25 AM  Result Value Ref Range   Glucose-Capillary 142 (H) 65 - 99 mg/dL  CBC with Differential/Platelet     Status: Abnormal   Collection Time: 08/17/15  6:29 AM  Result Value Ref Range   WBC 4.6 4.0 - 10.5 K/uL   RBC 4.43 4.22 - 5.81 MIL/uL   Hemoglobin 14.8 13.0 - 17.0 g/dL   HCT 42.9 39.0 - 52.0 %   MCV 96.8 78.0 - 100.0 fL   MCH 33.4 26.0 - 34.0 pg   MCHC 34.5 30.0 - 36.0 g/dL   RDW 12.6 11.5 - 15.5 %   Platelets 99 (L) 150 - 400 K/uL    Comment: CONSISTENT WITH PREVIOUS RESULT REPEATED TO VERIFY    Neutrophils  Relative % 64 %   Neutro Abs 2.9 1.7 - 7.7 K/uL   Lymphocytes Relative 21 %   Lymphs Abs 1.0 0.7 - 4.0 K/uL   Monocytes Relative 13 %   Monocytes Absolute 0.6 0.1 - 1.0 K/uL   Eosinophils Relative 2 %   Eosinophils Absolute 0.1 0.0 - 0.7 K/uL   Basophils Relative 0 %   Basophils Absolute 0.0 0.0 - 0.1 K/uL       General: No acute distress Mood and affect are appropriate Heart: Regular rate and rhythm no rubs murmurs or extra sounds Lungs: Clear to auscultation, breathing unlabored, no rales or wheezes Abdomen: Positive bowel sounds, soft nontender to palpation, nondistended Extremities: No clubbing, cyanosis, or edema Skin: No evidence of breakdown, no evidence of rash Neurologic: Cranial nerves II through XII intact, motor strength is 5/5 in bilateral deltoid, bicep, tricep, grip, hip flexor, knee extensors, ankle dorsiflexor and plantar flexor Sensory exam normal sensation to light touch and proprioception in bilateral upper  and lower extremities Cerebellar exam normal finger to nose to finger as well as heel to shin in L upper and lower extremities, Mild to moderate dysmetria right upper extremity Musculoskeletal: Full range of motion in all 4 extremities. No joint swelling    Assessment/Plan: 1. 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  Stable for D/C today F/u PCP in 3-4 weeks F/u PM&R 2 weeks See D/C summary See D/C instructions FIM: Function - Bathing Position: Shower Body parts bathed by patient: Right arm, Left arm, Chest, Abdomen, Front perineal area, Buttocks, Right upper leg, Left upper leg, Right lower leg, Left lower leg, Back Assist Level: Set up Set up : To obtain items  Function- Upper Body Dressing/Undressing What is the patient wearing?: Button up shirt Button up shirt - Perfomed by patient: Thread/unthread right sleeve, Thread/unthread left sleeve, Pull shirt around back, Button/unbutton shirt Assist Level: More than reasonable time Function - Lower Body Dressing/Undressing What is the patient wearing?: Underwear, Pants, Shoes, Ted Hose Position: Sitting EOB Underwear - Performed by patient: Thread/unthread right underwear leg, Thread/unthread left underwear leg, Pull underwear up/down Pants- Performed by patient: Thread/unthread right pants leg, Thread/unthread left pants leg, Pull pants up/down, Fasten/unfasten pants Shoes - Performed by patient: Don/doff right shoe, Don/doff left shoe, Fasten right, Fasten left TED Hose - Performed by patient: Don/doff right TED hose, Don/doff left TED hose TED Hose - Performed by helper: Don/doff right TED hose, Don/doff left TED hose Assist for footwear: Setup Assist for lower body dressing: Set up  Function - Toileting Toileting steps completed by patient: Adjust clothing prior to toileting, Performs perineal hygiene, Adjust clothing after  toileting Toileting Assistive Devices: Grab bar or rail Assist level: More than reasonable time  Function - Air cabin crew transfer assistive device: Pension scheme manager level to toilet: Supervision or verbal cues Assist level from toilet: Supervision or verbal cues  Function - Chair/bed transfer Chair/bed transfer method: Stand pivot Chair/bed transfer assist level: Supervision or verbal cues Chair/bed transfer assistive device: Bedrails, Armrests Chair/bed transfer details: Verbal cues for precautions/safety, Verbal cues for safe use of DME/AE, Verbal cues for technique  Function - Locomotion: Wheelchair Will patient use wheelchair at discharge?: No Function - Locomotion: Ambulation Assistive device: Walker-rolling Max distance: 300 Assist level: Supervision or verbal cues Assist level: Supervision or verbal cues Assist level: Supervision or verbal cues Assist level: Supervision or verbal cues Assist level: Supervision or verbal cues  Function -  Comprehension Comprehension: Auditory Comprehension assist level: Follows basic conversation/direction with no assist  Function - Expression Expression: Verbal Expression assist level: Expresses basic 75 - 89% of the time/requires cueing 10 - 24% of the time. Needs helper to occlude trach/needs to repeat words.  Function - Social Interaction Social Interaction assist level: Interacts appropriately 75 - 89% of the time - Needs redirection for appropriate language or to initiate interaction.  Function - Problem Solving Problem solving assist level: Solves basic 75 - 89% of the time/requires cueing 10 - 24% of the time  Function - Memory Memory assist level: Recognizes or recalls 75 - 89% of the time/requires cueing 10 - 24% of the time Patient normally able to recall (first 3 days only): Current season, That he or she is in a hospital, Location of own room, Staff names and faces  Medical Problem List and Plan: 1.  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  DVT Prophylaxis/Anticoagulation: Subcutaneous heparin. Monitor platelet counts and any signs of bleeding,Continue subcutaneous Lovenox 40 mg per day 3. Pain Management: Tylenol as needed 4. CAD/hypertension. Cardizem 360 mg daily. Monitor with increased mobility, cont current dose 5. Neuropsych: This patient is capable of making decisions on his own behalf. 6. Skin/Wound Care: Routine skin checks 7. Fluids/Electrolytes/Nutrition: Routine I&O with follow-up chemistries 8. Diabetes mellitus with peripheral neuropathy. Hemoglobin A1c 8.2. We will discharge on Levemir  20 units twice a day , we'll need to train wife and patient on administrationCheck blood sugars before meals and at bedtime CBG (last 3)   Recent Labs  08/16/15 1706 08/16/15 2029 08/17/15 0625  GLUCAP 145* 139* 142*    9. Alcohol abuse. Monitor for any signs of withdrawal, No signs of DTs 10. Hyperlipidemia. Zocor LOS (Days) 8 A FACE TO FACE EVALUATION WAS PERFORMED  KIRSTEINS,ANDREW E 08/17/2015, 8:28 AM

## 2015-08-17 NOTE — Progress Notes (Signed)
Patient discharged home after instructions given by Linna Hoff. No questions at this time. In no acute distress at this time.

## 2015-08-17 NOTE — Progress Notes (Signed)
Social Work  Discharge Note  The overall goal for the admission was met for:   Discharge location: Yes-HOME WITH WIFE WHO CAN PROVIDE 24 HR SUPERVISION  Length of Stay: Yes-8 DAYS  Discharge activity level: Yes-SUPERVISION LEVEL  Home/community participation: Yes  Services provided included: MD, RD, PT, OT, SLP, RN, CM, TR, Pharmacy, Neuropsych and SW  Financial Services: Medicare and Private Insurance: [employer]  Follow-up services arranged: Outpatient: [facility]-4/11 8;15-10;15 AND 4/18 8;45-9;30 AM  Comments (or additional information):WIFE AS BEEN HERE FOR EDUCATION AND FEELS COMFORTABLE WITH INSULIN AND BS CHECKS. AWARE OF OP THERAPY SCHEDULE AND PCP APPOINTMENT. PT WILL HAVE 24 HR SUPERVISION AT DC  Patient/Family verbalized understanding of follow-up arrangements: Yes  Individual responsible for coordination of the follow-up plan: SELF & [name]-WIFE  Confirmed correct DME delivered: [name] 08/17/2015    [name]

## 2015-08-20 ENCOUNTER — Telehealth: Payer: Self-pay

## 2015-08-20 NOTE — Telephone Encounter (Addendum)
1. Are you/is patient experiencing any problems since coming home? Are there any questions regarding any aspect of care? No 2. Are there any questions regarding medications administration/dosing? Are meds being taken as prescribed? Patient should review meds with caller to confirm. Meds have been confirmed. 3. Have there been any falls? No  4. Has Home Health been to the house and/or have they contacted you? If not, have you tried to contact them? Can we help you contact them? No HH, outpatient therapy. Outpatient appointments have been made.  5. Are bowels and bladder emptying properly? Are there any unexpected incontinence issues? If applicable, is patient following bowel/bladder programs? No issues.  6. Any fevers, problems with breathing, unexpected pain?  No 7. Are there any skin problems or new areas of breakdown? No 8. Has the patient/family member arranged specialty MD follow up (ie cardiology/neurology/renal/surgical/etc)?  Can we help arrange? Appointments have made.  9. Does the patient need any other services or support that we can help arrange? No 10. Are caregivers following through as expected in assisting the patient? Yes, wife. 11. Has the patient quit smoking, drinking alcohol, or using drugs as recommended? Pt is not smoking, drinking alcohol or using drugs.   Pt's appt is 08/29/15 @ 10:40am.

## 2015-08-21 ENCOUNTER — Ambulatory Visit: Payer: Medicare Other | Admitting: Occupational Therapy

## 2015-08-21 ENCOUNTER — Encounter: Payer: Self-pay | Admitting: Occupational Therapy

## 2015-08-21 ENCOUNTER — Ambulatory Visit: Payer: Medicare Other | Attending: Physical Medicine & Rehabilitation | Admitting: Physical Therapy

## 2015-08-21 VITALS — BP 159/91 | HR 92

## 2015-08-21 DIAGNOSIS — R260 Ataxic gait: Secondary | ICD-10-CM | POA: Diagnosis not present

## 2015-08-21 DIAGNOSIS — R208 Other disturbances of skin sensation: Secondary | ICD-10-CM | POA: Insufficient documentation

## 2015-08-21 DIAGNOSIS — R270 Ataxia, unspecified: Secondary | ICD-10-CM | POA: Insufficient documentation

## 2015-08-21 DIAGNOSIS — M25631 Stiffness of right wrist, not elsewhere classified: Secondary | ICD-10-CM | POA: Diagnosis not present

## 2015-08-21 DIAGNOSIS — R41844 Frontal lobe and executive function deficit: Secondary | ICD-10-CM

## 2015-08-21 DIAGNOSIS — R293 Abnormal posture: Secondary | ICD-10-CM | POA: Diagnosis not present

## 2015-08-21 DIAGNOSIS — M6281 Muscle weakness (generalized): Secondary | ICD-10-CM | POA: Diagnosis not present

## 2015-08-21 DIAGNOSIS — R471 Dysarthria and anarthria: Secondary | ICD-10-CM | POA: Diagnosis not present

## 2015-08-21 DIAGNOSIS — R41841 Cognitive communication deficit: Secondary | ICD-10-CM | POA: Diagnosis not present

## 2015-08-21 DIAGNOSIS — G8191 Hemiplegia, unspecified affecting right dominant side: Secondary | ICD-10-CM | POA: Insufficient documentation

## 2015-08-21 DIAGNOSIS — R278 Other lack of coordination: Secondary | ICD-10-CM | POA: Insufficient documentation

## 2015-08-21 DIAGNOSIS — R2681 Unsteadiness on feet: Secondary | ICD-10-CM | POA: Diagnosis not present

## 2015-08-21 DIAGNOSIS — R2689 Other abnormalities of gait and mobility: Secondary | ICD-10-CM | POA: Insufficient documentation

## 2015-08-21 DIAGNOSIS — R262 Difficulty in walking, not elsewhere classified: Secondary | ICD-10-CM | POA: Insufficient documentation

## 2015-08-21 NOTE — Therapy (Signed)
[facility] [address] [place], [name], [address] Phone: [phone]   Fax:  [phone]  Physical Therapy Evaluation  Patient Details  Name: Jose Beck MRN: [id number] Date of Birth: 02-07-1946 Referring Provider: Dr. Alysia Penna  Encounter Date: 08/21/2015      PT End of Session - 08/21/15 0937    Visit Number 1   Number of Visits 16   Date for PT Re-Evaluation 10/16/15   PT Start Time 0830   PT Stop Time 0915   PT Time Calculation (min) 45 min   Equipment Utilized During Treatment Gait belt   Activity Tolerance Patient tolerated treatment well   Behavior During Therapy [employer] for tasks assessed/performed      No past medical history on file.  No past surgical history on file.  There were no vitals filed for this visit.       Subjective Assessment - 08/21/15 0833    Subjective Pt is a 70 y/o male who presents to OPPT s/p cerebellar CVA on 08/06/15 while on vacation in [place].  Pt then hospitalized in [place] then transferred to CIR at [facility] from 08/08/15-08/17/15.  Pt presents today with use of RW and continued balance and mobility deficit.     Patient is accompained by: Family member   Pertinent History HTN, HLD, DM, CAD   Limitations Standing;Walking;House hold activities   Patient Stated Goals improve function and mobility; wants to go to beach in May with many stairs to navigate   Currently in Pain? No/denies            [facility] PT Assessment - 08/21/15 0837    Assessment   Medical Diagnosis Cerebellar CVA   Referring Provider Dr. Alysia Penna   Onset Date/Surgical Date 08/06/15   Next MD Visit 08/29/15 - Dr. Posey Pronto   Prior Therapy CIR   Precautions   Precautions Fall   Restrictions   Weight Bearing Restrictions No   Balance Screen   Has the patient fallen in the past 6 months No   Has the patient had a decrease in activity level because of a fear of falling?  No   Is the  patient reluctant to leave their home because of a fear of falling?  No   Home Environment   Living Environment Private residence   Living Arrangements Spouse/significant other   Available Help at Discharge Family   Type of [place] to enter   Entrance Stairs-Number of Steps 2  with landing and then single step into house   Entrance Stairs-Rails None   Home Layout Two level;Able to live on main level with bedroom/bathroom   [place] - 2 wheels;Shower seat   Prior Function   Level of Independence Independent   Vocation Retired   Biomedical scientist retired from [employer]   Leisure travel-plan for [employer] trip May 10   Cognition   Overall Cognitive Status Within Functional Limits for tasks assessed   Observation/Other Assessments   Focus on Therapeutic Outcomes (FOTO)  46 (54% limited; predicted 33% limited)   Stroke Impact Scale  Mobility - 47% limited / Hand Function - 40% limited   Coordination   Heel Shin Test [facility] BLE   Posture/Postural Control   Posture/Postural Control Postural limitations   Postural Limitations Rounded Shoulders;Forward head   Strength   Right Hip Flexion 4/5   Right Hip ABduction 4/5   Left Hip Flexion 4+/5   Left Hip  ABduction 4+/5   Right Knee Flexion 5/5   Right Knee Extension 5/5   Left Knee Flexion 5/5   Left Knee Extension 5/5   Right Ankle Dorsiflexion 4/5   Left Ankle Dorsiflexion 5/5   Ambulation/Gait   Ambulation/Gait Yes   Ambulation/Gait Assistance 4: Min guard   Ambulation Distance (Feet) 75 Feet   Assistive device Rolling walker   Gait Pattern Ataxic;Wide base of support   Ambulation Surface Level;Indoor   Gait velocity 2.29 ft/sec  14.31 sec   Gait velocity - backwards /   Standardized Balance Assessment   Standardized Balance Assessment Timed Up and Go Test;Berg Balance Test   Berg Balance Test   Sit to Stand Able to stand without using hands and stabilize independently   Standing  Unsupported Able to stand safely 2 minutes   Sitting with Back Unsupported but Feet Supported on Floor or Stool Able to sit safely and securely 2 minutes   Stand to Sit Sits safely with minimal use of hands   Transfers Able to transfer with verbal cueing and /or supervision   Standing Unsupported with Eyes Closed Able to stand 10 seconds with supervision   Standing Ubsupported with Feet Together Able to place feet together independently and stand for 1 minute with supervision   From Standing, Reach Forward with Outstretched Arm Reaches forward but needs supervision   From Standing Position, Pick up Object from [place] to pick up shoe, needs supervision   From Standing Position, Turn to Look Behind Over each Shoulder Needs assist to keep from losing balance and falling   Turn 360 Degrees Needs assistance while turning   Standing Unsupported, Alternately Place Feet on Step/Stool Needs assistance to keep from falling or unable to try   Standing Unsupported, One Foot in Front Able to take small step independently and hold 30 seconds   Standing on One Leg Tries to lift leg/unable to hold 3 seconds but remains standing independently   Total Score 31   Timed Up and Go Test   Normal TUG (seconds) 34.09   TUG Comments with RW                           PT Education - 08/21/15 0937    Education provided Yes   Education Details POC, goals of care, clinical findings   Person(s) Educated Patient;Spouse   Methods Explanation   Comprehension Verbalized understanding          PT Short Term Goals - 08/21/15 0941    PT SHORT TERM GOAL #1   Title verbalize understanding of CVA risk factors/warning signs to decrease risk of reinjury (09/18/15)   Time 4   Period Weeks   Status New   PT SHORT TERM GOAL #2   Title improve BERG balance score to >/= 38/56 for improved balance    PT SHORT TERM GOAL #3   Title improve timed up and go to < 30 sec with LRAD for improved mobility and  function   PT SHORT TERM GOAL #4   Title ambulate > 250' with LRAD modified independent on indoor/paved outdoor surfaces for improved function   PT SHORT TERM GOAL #5   Title negotiate > 8 steps with 1 handrail and supervision for improved function and ability to negotiate stairs at [employer]           PT Long Term Goals - 08/21/15 0943    PT LONG TERM GOAL #1  Title independent with HEP (10/16/15)   Time 8   Period Weeks   Status New   PT LONG TERM GOAL #2   Title improve BERG balance score to >/= 45/56 for decreased fall risk   PT LONG TERM GOAL #3   Title improve gait velocity to > 2.62 ft/sec for improved function and mobility   PT LONG TERM GOAL #4   Title improve timed up and go with LRAD to < 25 sec for improved function and mobility   PT LONG TERM GOAL #5   Title ambulate > 500' on various indoor/outdoor surfaces with LRAD modified independent for improved function and mobility.               Plan - 08/21/15 0938    Clinical Impression Statement Pt is a 70 y/o male who presents to OPPT for moderate complexity PT evaluation s/p cerebellar CVA on 08/06/15.  Pt demonstrates mild strength deficits, as well as gait and balance deficits affecting safe functional mobility.  Pt is high fall risk as indicated by timed up and go and BERG scores.  Pt will benefit from PT to improve strength, balance and gait in order to maximize safe functional mobility.   Rehab Potential Good   PT Frequency 2x / week   PT Duration 8 weeks   PT Treatment/Interventions ADLs/Self Care Home Management;Electrical Stimulation;Patient/family education;Neuromuscular re-education;Balance training;Therapeutic exercise;Therapeutic activities;Functional mobility training;Stair training;Gait training;Vestibular   PT Next Visit Plan establish HEP for balance/strengthening (standing exercises); CVA ed; gait   Consulted and Agree with Plan of Care Patient;Family member/caregiver   Family Member Consulted  wife      Patient will benefit from skilled therapeutic intervention in order to improve the following deficits and impairments:  Abnormal gait, Decreased balance, Decreased coordination, Decreased mobility, Postural dysfunction, Decreased strength, Decreased activity tolerance  Visit Diagnosis: Ataxic gait - Plan: PT plan of care cert/re-cert  Muscle weakness (generalized) - Plan: PT plan of care cert/re-cert  Difficulty in walking, not elsewhere classified - Plan: PT plan of care cert/re-cert  Unsteadiness on feet - Plan: PT plan of care cert/re-cert      G-Codes - [id number]    Functional Assessment Tool Used BERG 31/56   Functional Limitation Mobility: Walking and moving around   Mobility: Walking and Moving Around Current Status [id number]) At least 40 percent but less than 60 percent impaired, limited or restricted   Mobility: Walking and Moving Around Goal Status [id number]) At least 1 percent but less than 20 percent impaired, limited or restricted       Problem List Patient Active Problem List   Diagnosis Date Noted  . 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 Gait disturbance, 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  . CVA (cerebral infarction) 08/09/2015   [name], PT, DPT 08/21/2015 9:48 AM  [facility] [address] [place], [name], [address] Phone: [phone]   Fax:  [phone]  Name: Jose Beck MRN: [id number] Date of Birth: October 12, 1945

## 2015-08-21 NOTE — Therapy (Signed)
[facility] [address] [place], [name], [address] Phone: [phone]   Fax:  [phone]  Occupational Therapy Evaluation  Patient Details  Name: Jose Beck MRN: [id number] Date of Birth: [birth year] Referring Provider: Dr. Letta Pate  Encounter Date: 70/03/2016      OT End of Session - 08/21/15 1113    Visit Number 1   Number of Visits 16   Date for OT Re-Evaluation 10/16/15   Authorization Type medicare   Authorization Time Period 60 days, Pt will need G code and PN every 10th visit   Authorization - Visit Number 1   Authorization - Number of Visits 10   OT Start Time 0931   OT Stop Time 1014   OT Time Calculation (min) 43 min   Activity Tolerance Patient tolerated treatment well      History reviewed. No pertinent past medical history.  History reviewed. No pertinent past surgical history.  Filed Vitals:   08/21/15 0938  BP: 159/91  Pulse: 92        Subjective Assessment - 08/21/15 0940    Subjective  I had PT earlier   Patient is accompained by: Family member  wife [name]   Pertinent History see epic   Patient Stated Goals To get back to normal - my speech, my toothbrush, my R leg           [facility] OT Assessment - 08/21/15 0942    Assessment   Diagnosis Bilateral cerebellar CV's, R greater than L   Referring Provider Dr. Letta Pate   Onset Date 08/06/15   Prior Therapy PT, OT and ST inpt rehab d/c 08/17/2015   Precautions   Precautions Fall   Restrictions   Weight Bearing Restrictions No   Balance Screen   Has the patient fallen in the past 6 months No  Pt had PT eval today   Home  Environment   Family/patient expects to be discharged to: Private residence   Living Arrangements Spouse/significant other   Type of [place] One level  lives on first floor   Bathroom Shower/Tub [place] Handicapped height   Additional Comments Pt has  transfer tub bench and uses sink to pull to stand    Prior Function   Level of Independence Independent   Vocation Retired   Biomedical scientist retired from [employer] [place] travel - beach trip planned for May 10   ADL   Eating/Feeding Minimal assistance   Grooming Modified independent  increased time   Upper Body Bathing Supervision/safety   Lower Body Bathing Supervision/safety  stands to wash bottom   Upper Body Dressing Supervision/safety   Lower Body Dressing Supervision/safety   Audiological scientist - Water engineer -  Patent attorney Details (indicate cue type and reason Pt has to step over ledge   IADL   [place] for transportation   [employer] Does not participate in any housekeeping tasks   Meal Prep Needs to have meals prepared and served  pt used to [employer] on [place] on family or friends for transportation   Medication Management Takes responsibility if medication is prepared in advance in seperate dosage  pt filling pill box and wife checking behind no issues    Financial Management Dependent   Mobility   Mobility Status Needs  assist   Mobility Status Comments Close supervision in community using walker.    Written Expression   Dominant Hand Right   Vision - History   Baseline Vision Wears glasses all the time  progressive bifocals   Additional Comments Pt has h/o of floater in L eye   Vision Assessment   Eye Alignment Impaired (comment)   Ocular Range of Motion Within Functional Limits   Tracking/Visual Pursuits Able to track stimulus in all quads without difficulty   Saccades Undershoots   Visual Fields No apparent deficits   Comment Pt with some diplopia intitally but has resolved.  Pt states he thinks it is his glasses.     Activity Tolerance   Activity Tolerance Tolerate 30+  min activity without fatigue   Cognition   Overall Cognitive Status Impaired/Different from baseline   Area of Impairment Memory;Safety/judgement;Awareness   Memory Comments retrieval of more complex information   Safety/Judgement Decreased awareness of safety;Decreased awareness of deficits   Awareness Intellectual   Awareness Comments Pt unsafe trying to navigate to chair however unaware of unsafe behavior even when pointed out to pt.    Problem Solving Impaired   Problem Solving Impairment Functional basic;Verbal complex;Functional complex   Executive Function Self Monitoring;Organizing;Reasoning   Reasoning Impaired   Organizing Impaired   Self Monitoring Impaired   Behaviors Other (comment)  flat affect   Sensation   Light Touch Appears Intact   Hot/Cold Appears Intact   Proprioception Appears Intact   Additional Comments Kinesthetic impaired in RUE.   Coordination   Gross Motor Movements are Fluid and Coordinated --  Ataxic on R smooth on L   Fine Motor Movements are Fluid and Coordinated --  R ataxic, L smooth   Finger Nose Finger Test signficantly impaired for RUE, intact for LUE   9 Hole Peg Test --  to be assessed   Box and Blocks --  To be assessed   Other Pt with dystonic posturing in RUE with any effort.  Wife reports that pt sometimes holds hand or arm in a "funny position".   Praxis   Praxis --  to be further assessed via functional activity   ROM / Strength   AROM / PROM / Strength AROM;Strength   AROM   Overall AROM  Deficits   Overall AROM Comments LUE WFL's.  RUE WFLs except limited supination in R forearm and pt c/o of RUE feeling tired and tight.   Strength   Overall Strength Deficits   Overall Strength Comments LUE WFL's, RUE 4+/5 proximally 5/5 elbow to hand   Hand Function   Right Hand Gross Grasp Functional   Right Hand Grip (lbs) 62   Left Hand Gross Grasp Functional   Left Hand Grip (lbs) 58                           OT  Short Term Goals - 08/21/15 1053    OT SHORT TERM GOAL #1   Title Pt and wife will be mod I with HEP - 09/18/2015   Status New   OT SHORT TERM GOAL #2   Title Pt will be mod I with toilet transfers   Status New   OT SHORT TERM GOAL #3   Title Pt will be mod I with shower transfers   Status New   OT SHORT TERM GOAL #4   Title Pt will be able to cut meat, AE prn   Status New  OT SHORT TERM GOAL #5   Title Assess 9 hole peg and box and blocks with RUE and set goal as appropriate.    Status New   Additional Short Term Goals   Additional Short Term Goals Yes   OT SHORT TERM GOAL #6   Title Pt will demonstrate improved awareness as evidenced by being able to identify 2 cognitive deficits he is working on   Status New           OT Long Term Goals - 08/21/15 [place] #1   Title Pt and wife will be mod I with upgraded HEP - 10/16/2015   Status New   OT LONG TERM GOAL #2   Title Pt will be mod I with simple hot familiar meal prep   Status New   OT LONG TERM GOAL #3   Title Pt will need no more than 2 vc's for moderate complex problem sovling within the context of a functional task.   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 Pt was hospitalized and then transferred to inpt rehab for PT, OT and ST; pt d/c home with wife on 08/17/2015.Pt with PMH: HTN, HLD, CAD, DM, peirpheral neuropathy, TIA, alcohol abuse, occluded R ICA.  Pt with BP today of 159/91 with resting HR of 92 even after prolonged rest.  Pt presents today with the following deficits that impact independence in ADL/IADL, leisure and all life roles:  ataxic R dominant UE, impaired kinesthetic sense RUE, dystonic posturing of RUE, impaired functional use of RUE,  decreased balance, R dominant hemiplegia, impaired cognition including memory, organization, problem solving, intellecutal awareness, self monitoring and self correctng and safety awareness.  Pt will benefit from skilled OT to address these deficits to maximize independence in ADL/IADL, leisure and resumption of life roles.    Rehab Potential Good   Clinical Impairments Affecting Rehab Potential decreaed awareness, cognition;  wife also appears to have poor understanding of pt's cogntive deficits.    OT Frequency 2x / week   OT Duration 8 weeks   OT Treatment/Interventions Self-care/ADL training;Therapeutic exercise;Neuromuscular education;DME and/or AE instruction;Manual Therapy;Therapist, nutritional;Therapeutic activities;Cognitive remediation/compensation;Patient/family education;Balance training;Moist Heat;Ultrasound   Plan assess 9 hole peg and box and blocks for RUE and set goal prn, intiate HEP   Consulted and Agree with Plan of Care Patient;Family member/caregiver   Family Member Consulted wife      Patient will benefit from skilled therapeutic intervention in order to improve the following deficits and impairments:  Abnormal gait, Cardiopulmonary status limiting activity, Decreased balance, Decreased cognition, Decreased coordination, Decreased safety awareness, Decreased mobility, Decreased strength, Difficulty walking, Impaired UE functional use, Impaired sensation  Visit Diagnosis: Hemiplegia, unspecified affecting right dominant side ([place]) - Plan: Ot plan of care cert/re-cert  Frontal lobe and executive function deficit - Plan: Ot plan of care cert/re-cert  Ataxia, unspecified - Plan: Ot plan of care cert/re-cert  Other lack of coordination - Plan: Ot plan of care cert/re-cert  Other abnormalities of gait and mobility - Plan: Ot plan of care cert/re-cert  Abnormal posture - Plan: Ot plan of care cert/re-cert  Other disturbances  of skin sensation - Plan: Ot plan of care  cert/re-cert  Stiffness of right wrist, not elsewhere classified - Plan: Ot plan of care cert/re-cert      G-Codes - [id number]    Functional Assessment Tool Used 9 hole peg, box and blocks, skilled clinical observation   Functional Limitation Self care  ADL and IADL   Self Care Current Status [id number]) At least 80 percent but less than 100 percent impaired, limited or restricted   Self Care Goal Status [id number]) At least 40 percent but less than 60 percent impaired, limited or restricted      Problem List Patient Active Problem List   Diagnosis Date Noted  . 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 Gait disturbance, 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  . CVA (cerebral infarction) 08/09/2015    Quay Burow, OTR/L 08/21/2015, 11:21 AM  [facility] [address] [place], [name], [address] Phone: [phone]   Fax:  [phone]  Name: Livio Hoffecker MRN: [id number] Date of Birth: Jun 13, 1945

## 2015-08-28 ENCOUNTER — Ambulatory Visit: Payer: Medicare Other | Admitting: Speech Pathology

## 2015-08-28 ENCOUNTER — Ambulatory Visit: Payer: Medicare Other | Admitting: Occupational Therapy

## 2015-08-28 DIAGNOSIS — R471 Dysarthria and anarthria: Secondary | ICD-10-CM

## 2015-08-28 DIAGNOSIS — R41844 Frontal lobe and executive function deficit: Secondary | ICD-10-CM

## 2015-08-28 DIAGNOSIS — R262 Difficulty in walking, not elsewhere classified: Secondary | ICD-10-CM | POA: Diagnosis not present

## 2015-08-28 DIAGNOSIS — R41841 Cognitive communication deficit: Secondary | ICD-10-CM

## 2015-08-28 DIAGNOSIS — R260 Ataxic gait: Secondary | ICD-10-CM | POA: Diagnosis not present

## 2015-08-28 DIAGNOSIS — M6281 Muscle weakness (generalized): Secondary | ICD-10-CM | POA: Diagnosis not present

## 2015-08-28 DIAGNOSIS — R278 Other lack of coordination: Secondary | ICD-10-CM | POA: Diagnosis not present

## 2015-08-28 DIAGNOSIS — G8191 Hemiplegia, unspecified affecting right dominant side: Secondary | ICD-10-CM

## 2015-08-28 DIAGNOSIS — R270 Ataxia, unspecified: Secondary | ICD-10-CM

## 2015-08-28 DIAGNOSIS — R2681 Unsteadiness on feet: Secondary | ICD-10-CM | POA: Diagnosis not present

## 2015-08-28 NOTE — Patient Instructions (Addendum)
  Coordination Activities  Perform the following activities for 20 minutes 1-2 times per day with both hand(s).  Emphasis on right hand.   Rotate ball in fingertips (clockwise and counter-clockwise).  Flip cards 1 at a time.  Use index finger and turn palm up  Deal cards with your thumb (Hold deck in hand and push card off top with thumb).  Rotate card in hand (clockwise and counter-clockwise).  Pick up coins, buttons, marbles, dried beans/pasta of different sizes and place in container.  Pick up coins and place in container or coin bank.  Pick up coins one at a time to stack one at a time.  With left hand  Practice writing, tracing, coloring  With foam and/or typing.

## 2015-08-28 NOTE — Therapy (Signed)
[facility] [address] [place], [name], [address] Phone: [phone]   Fax:  [phone]  Occupational Therapy Treatment  Patient Details  Name: Jose Beck MRN: [id number] Date of Birth: 08-Nov-1945 Referring Provider: Dr. Letta Pate  Encounter Date: 08/28/2015      OT End of Session - 08/28/15 0818    Visit Number 2   Number of Visits 16   Date for OT Re-Evaluation 10/16/15   Authorization Type medicare   Authorization Time Period 60 days, Pt will need G code and PN every 10th visit   Authorization - Visit Number 2   Authorization - Number of Visits 10   OT Start Time 0806   OT Stop Time 0846   OT Time Calculation (min) 40 min   Activity Tolerance Patient tolerated treatment well      No past medical history on file.  No past surgical history on file.  There were no vitals filed for this visit.      Subjective Assessment - 08/28/15 0810    Subjective  Pt reports that he is not sure why he doesn't use R 2nd digit ("they plugged it at the hospital"--?pulse ox was on it?)   Patient is accompained by: Family member   Pertinent History see epic   Patient Stated Goals To get back to normal - my speech, my toothbrush, my R leg   Currently in Pain? No/denies            [facility] OT Assessment - 08/28/15 0915    Coordination   9 Hole Peg Test Right;Left   Right 9 Hole Peg Test 46min 55.16sec   Left 9 Hole Peg Test 49.15sec   Box and Blocks R-19 blocks, L-29 blocks                          OT Education - 08/28/15 0913    Education provided Yes   Education Details Coordination HEP--see pt instructions; avoid anything sharp, hot, heavy, breakable with RUE due to decr coordination and awareness of positioning/decr sensation for safety; issued foam for pen   Person(s) Educated Patient;Spouse   Methods Explanation;Demonstration;Verbal cues;Handout   Comprehension Verbalized  understanding;Returned demonstration;Verbal cues required          OT Short Term Goals - 08/28/15 1322    OT SHORT TERM GOAL #1   Title Pt and wife will be mod I with HEP - 09/18/2015   Status New   OT SHORT TERM GOAL #2   Title Pt will be mod I with toilet transfers   Status New   OT SHORT TERM GOAL #3   Title Pt will be mod I with shower transfers   Status New   OT SHORT TERM GOAL #4   Title Pt will be able to cut meat, AE prn   Status New   OT SHORT TERM GOAL #5   Title Pt will improve functional reaching/coordination as shown by improving score on box and blocks test by at least 10 bilaterally.   Baseline R-19blocks, L-29 blocks   Status Revised   OT SHORT TERM GOAL #6   Title Pt will demonstrate improved awareness as evidenced by being able to identify 2 cognitive deficits he is working on   Status New           OT Long Term Goals - 08/28/15 [place] #1   Title Pt and wife  will be mod I with upgraded HEP - 10/16/2015   Status New   OT LONG TERM GOAL #2   Title Pt will be mod I with simple hot familiar meal prep   Status New   OT LONG TERM GOAL #3   Title Pt will need no more than 2 vc's for moderate complex problem sovling within the context of a functional task.   Status New   OT LONG TERM GOAL #4   Title Pt will demonstrate improved safety awareness with functional mobility to reach mod I level   Status New   OT LONG TERM GOAL #5   Title Pt will demo improved coordination for ADLs as shown by completing 9-hole peg in 70sec or less with RUE.   Baseline 79min 55.16sec   Status New               Plan - 08/28/15 0916    Clinical Impression Statement Pt initially held R 2nd digit in extension and didn't incorporate it into functional tasks, but with cueing/repetition, pt demo improvement with incr R 2nd digit use.  Assessed 9-hole peg test and box and blocks test and established goals.   Rehab Potential Good   Clinical Impairments Affecting  Rehab Potential decreaed awareness, cognition;  wife also appears to have poor understanding of pt's cogntive deficits.    OT Frequency 2x / week   OT Duration 8 weeks   OT Treatment/Interventions Self-care/ADL training;Therapeutic exercise;Neuromuscular education;DME and/or AE instruction;Manual Therapy;Therapist, nutritional;Therapeutic activities;Cognitive remediation/compensation;Patient/family education;Balance training;Moist Heat;Ultrasound   Plan review HEP prn, cognitive re-training   Consulted and Agree with Plan of Care Patient;Family member/caregiver   Family Member Consulted wife      Patient will benefit from skilled therapeutic intervention in order to improve the following deficits and impairments:  Abnormal gait, Cardiopulmonary status limiting activity, Decreased balance, Decreased cognition, Decreased coordination, Decreased safety awareness, Decreased mobility, Decreased strength, Difficulty walking, Impaired UE functional use, Impaired sensation  Visit Diagnosis: Hemiplegia, unspecified affecting right dominant side ([facility])  Frontal lobe and executive function deficit  Ataxia, unspecified  Other lack of coordination    Problem List Patient Active Problem List   Diagnosis Date Noted  . 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 Gait disturbance, 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  . CVA (cerebral infarction) 08/09/2015    [facility] 08/28/2015, 1:26 PM  [facility] [address] [place], [name], [address] Phone: [phone]   Fax:  [phone]  Name: Jose Beck MRN: [id number] Date of Birth: 05-08-1946  Vianne Bulls, OTR/L [facility] [address] [place], [place]  [address] [phone] phone [phone] 08/28/2015 1:26 PM

## 2015-08-28 NOTE — Patient Instructions (Signed)
    SLOW LOUD OVER-ENNUNCIATE PAUSE   BUTTERCUP  CATERPILLAR  BASEBALLL PLAYER  [place]  [place]  SLOW AND BIG - EXAGGERATE YOUR MOUTH, MAKE EACH CONSONANT  Speech exercises - do 5x each, x2-3/day SLOW BIG  SAY THE FOLLOWING- make every sound! Red leather, yellow leather  Big grocery buggy    Purple baby carriage    [facility] [place] copper coffee pot Ripe purple cabbage Three free throws [employer], Blue Bulb Flash Message Dave dipped the dessert  [employer] [place] An Chief Financial Officer for [employer] Five valve levers Six Thick Thistles Stick Double Bubble Gum Fat cows give milk [employer] Gophers Fat frogs flip freely Kohl's into bed Get that game to [employer] Fish Cinnamon aluminum linoleum Black bugs blood Lovely lemon linament Buckle that Education officer, museum Takes Time A Shifty Salt Shaker   The gospel of Mark Shirts shrink, shells shouldn't [place] [employer]ers Take the tackle box Give me five flapjacks Fundamental relatives Call the cat "[place]tercup" A calendar of [place] Four floors to cover Yellow oil ointment Fellow lovers of felines Catastrophe in [place]' plums The church's chimes chimed Telling time until eleven Unique [place] A Three Toed Tree Toad Knapsack Strap Snap [place]

## 2015-08-28 NOTE — Therapy (Signed)
[facility] [address] [place], [name], [address] Phone: [phone]   Fax:  [phone]  Speech Language Pathology Evaluation  Patient Details  Name: Jose Beck MRN: [id number] Date of Birth: March 08, 1946 Referring Provider: Dr, Lupe Carney  Encounter Date: 08/28/2015      End of Session - 08/28/15 1045    Visit Number 1   Number of Visits 17   Date for SLP Re-Evaluation 10/23/15   SLP Start Time 0847   SLP Stop Time  0930   SLP Time Calculation (min) 43 min   Activity Tolerance Patient tolerated treatment well      No past medical history on file.  No past surgical history on file.  There were no vitals filed for this visit.      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 No/denies            SLP Evaluation [facility] - 08/28/15 0855    SLP Visit Information   SLP Received On 08/28/15   Referring Provider Dr, Lupe Carney   Onset Date August 06, 2015   Medical Diagnosis Cerebellar CVA   Subjective   Patient/Family Stated Goal "To get my speech more clear"   General Information   HPI 70 year old right handed male resident of [place] with history of hypertension, CAD followed by Dr. Peter Martinique, TIA, hyperlipidemia, diabetes mellitus, alcohol abuse. Patient visiting [place] on vacation. Presented to [facility] 08/06/2015 with dizziness and slurred speech after being found down in the bathroom. Denied any chest pain. Noted blood pressure 238/130. EKG normal sinus rhythm. CT MRI imaging showed subacute infarct superior right cerebellar hemisphere with small acute infarct contralateral superior left cerebellar hemisphere. Occluded intracranial right vertebral artery   Mobility Status uses walker    Prior Functional Status   Cognitive/Linguistic Baseline Within functional limits   Type of Home House    Lives With Spouse   Available Support Neighbor   Vocation Retired   Associate Professor   Overall Cognitive Status Impaired/Different from baseline   Area of Impairment Memory;Safety/judgement;Awareness   Memory Decreased short-term memory   Awareness Intellectual   Problem Solving Impaired   Problem Solving Impairment Verbal complex   Auditory Comprehension   Overall Auditory Comprehension Appears within functional limits for tasks assessed   Expression   Primary Mode of Expression Verbal   Verbal Expression   Initiation No impairment   Repetition No impairment   Naming Impairment   Responsive 76-100% accurate   Confrontation 75-100% accurate   Convergent Not tested   Divergent 50-74% accurate   Other Naming Comments semantic paraphasias in conversation, reduced linguistic complexity, detailed language   Pragmatics No impairment   Written Expression   Dominant Hand Right   Written Expression Not tested   Overall Writen Expression attempted check writing with build up pencil - poor legibility due to R UE dystonia   Oral Motor/Sensory Function   Overall Oral Motor/Sensory Function Impaired   Labial ROM Within Functional Limits   Labial Symmetry Within Functional Limits   Labial Strength Within Functional Limits   Lingual ROM Within Functional Limits   Lingual Symmetry Within Functional Limits   Lingual Coordination Reduced   Facial ROM Within Functional Limits   Motor Speech   Overall Motor Speech Impaired  Respiration Within functional limits   Articulation Impaired   Level of Impairment Sentence   Intelligibility Intelligibility reduced   Word 75-100% accurate   Phrase 75-100% accurate   Sentence 50-74% accurate   Conversation 50-74% accurate   Standardized Assessments   Standardized Assessments  [place] Naming Test-2nd edition   Assessment   Type of Dysarthria Ataxic                       ADULT SLP TREATMENT - 08/28/15 1155    Cognitive-Linquistic Treatment   Skilled Treatment Initiated training of HEP for dysarthria with usual min instructions, modeling and feedback.  Trained pt in compensations for dysarthria with structured speech tasks with mod A - pt requried mod questioning cues to verbalize compensations.           SLP Education - 08/28/15 1044    Education provided Yes   Education Details goals for ST, HEP for dysarthira, compensations for dysarthria   Person(s) Educated Patient;Spouse   Methods Explanation;Demonstration;Verbal cues;Handout   Comprehension Verbalized understanding;Returned demonstration;Need further instruction          SLP Short Term Goals - 08/28/15 1057    SLP SHORT TERM GOAL #1   Title Pt will perform dysarthria HEP with mod I   Time 4   Period Weeks   Status New   SLP SHORT TERM GOAL #2   Title Pt will utilize compensations for dysarthira during structured tasks with rare min A   Time 4   Period Weeks   Status New   SLP SHORT TERM GOAL #3   Title Pt will demonstrate utterance length of 5-7 words during structured speech/descriptive tasks with rare min A   Time 4   Period Weeks   Status New          SLP Long Term Goals - 08/28/15 1100    SLP LONG TERM GOAL #1   Title Pt will be 95% intellgible during simple conversation of 10 minutes with rare min A   Time 8   Period Weeks   Status New   SLP LONG TERM GOAL #2   Title Pt will participate in mildly complex conversation over 12 minutes with less than 3 requests for clarification/repeair communication breakdown.    Time 8   Period Weeks   Status New   SLP LONG TERM GOAL #3   Title Pt will utilize compensations for short term memory to recall details, lists, messages with 85% accuracy and rare min A.   Time 8   Period Weeks   Status New          Plan - 08/28/15 1046    Clinical Impression Statement Mr. Lersch, a 70 y.o. male is  s/p bilaterateral cerebellar CVA 08/06/15. He received inpatient ST on actue care and inpt rehab unit focusing on cognition and speech. Today, Mr. 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 Conversation was judged to be 80% intelligible. Mr. Collens reports his speech is best in the morning (now) and declines significantly as the day progresses.  Lingual incoordination noted during oral motor assessment. Mrs. Flaum, the pt's spouse reports some word finding difficulties as well as reduced linguistic complexity, which has affected their communication at home, specifcall when the pt is trying to give his wife instructions/directions.  Semantic paraphasias noted during conversation, with awareness and self correction by the pt with min A. He scored 58/60 on the [facility]  Naming  Test which is [facility].  Utterance length is reduced to 2-3 words, even during open ended descriptive conversation re: favorite trips. Narratives are incohesive and required usual mod questioning to comphrend his message. Spouse agrees this occurs at home also. Cognitive assessment revealed redued short term memory, pt recalled 2/5 words with a delay. Spouse and pt both deny difficulty with pt recalling meds or appointments at home.  Abstract verbal reasoning, math word problems (time and money), verbal sequencing and verbal problem solving were [facility]. Pt did demonstrate reduced awareness of cognitive changes since his CVA, when asked what has changed since his stroke, he answered "nothing," indicating reduced awareness. More specific questioning re: his walking, R arm,  memory and speech revealed intellectual awareness. Spouse denies pt making unsafe decisions at home. Both Mr. And Mrs. Branford state speech intelligibility and language as their primary focus for ST.  I reccommend skilled ST to maximize intellgibility, verbal expression and memory compensations for improved  independence and communication.    Speech Therapy Frequency 2x / week   Duration --  8 weeks   Treatment/Interventions Cognitive reorganization;Internal/external aids;Compensatory strategies;SLP instruction and feedback;Patient/family education;Functional tasks;Language facilitation   Potential to Achieve Goals Good   Potential Considerations Ability to learn/carryover information   SLP Home Exercise Plan see pt instructions   Consulted and Agree with Plan of Care Patient;Family member/caregiver   Family Member Consulted spouse      Patient will benefit from skilled therapeutic intervention in order to improve the following deficits and impairments:   Dysarthria and anarthria - Plan: SLP plan of care cert/re-cert  Cognitive communication deficit - Plan: SLP plan of care cert/re-cert      G-Codes - [id number]    Functional Assessment Tool Used NOMS   Functional Limitations Motor speech   Motor Speech Current Status [phone]) At least 20 percent but less than 40 percent impaired, limited or restricted   Motor Speech Goal Status [id number]) At least 1 percent but less than 20 percent impaired, limited or restricted      Problem List Patient Active Problem List   Diagnosis Date Noted  . 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 Gait disturbance, 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  . CVA (cerebral infarction) 08/09/2015    Lovvorn, Annye Rusk MS, CCC-SLP 08/28/2015, 12:00 PM  [facility] [address] [place], [name], [address] Phone: [phone]   Fax:  [phone]  Name: Zigmond Disch MRN: [id number] Date of Birth: July 18, 1945

## 2015-08-29 ENCOUNTER — Encounter: Payer: Self-pay | Admitting: Physical Medicine & Rehabilitation

## 2015-08-29 ENCOUNTER — Encounter: Payer: Medicare Other | Attending: Physical Medicine & Rehabilitation | Admitting: Physical Medicine & Rehabilitation

## 2015-08-29 VITALS — BP 154/79 | HR 97 | Resp 14

## 2015-08-29 DIAGNOSIS — I69398 Other sequelae of cerebral infarction: Secondary | ICD-10-CM | POA: Diagnosis not present

## 2015-08-29 DIAGNOSIS — F1010 Alcohol abuse, uncomplicated: Secondary | ICD-10-CM | POA: Insufficient documentation

## 2015-08-29 DIAGNOSIS — I69393 Ataxia following cerebral infarction: Secondary | ICD-10-CM

## 2015-08-29 DIAGNOSIS — Z8673 Personal history of transient ischemic attack (TIA), and cerebral infarction without residual deficits: Secondary | ICD-10-CM | POA: Insufficient documentation

## 2015-08-29 DIAGNOSIS — Z794 Long term (current) use of insulin: Secondary | ICD-10-CM | POA: Insufficient documentation

## 2015-08-29 DIAGNOSIS — M25551 Pain in right hip: Secondary | ICD-10-CM | POA: Diagnosis not present

## 2015-08-29 DIAGNOSIS — E1142 Type 2 diabetes mellitus with diabetic polyneuropathy: Secondary | ICD-10-CM | POA: Diagnosis not present

## 2015-08-29 DIAGNOSIS — I2510 Atherosclerotic heart disease of native coronary artery without angina pectoris: Secondary | ICD-10-CM | POA: Insufficient documentation

## 2015-08-29 DIAGNOSIS — Z09 Encounter for follow-up examination after completed treatment for conditions other than malignant neoplasm: Secondary | ICD-10-CM | POA: Insufficient documentation

## 2015-08-29 DIAGNOSIS — R269 Unspecified abnormalities of gait and mobility: Secondary | ICD-10-CM

## 2015-08-29 DIAGNOSIS — I10 Essential (primary) hypertension: Secondary | ICD-10-CM | POA: Diagnosis not present

## 2015-08-29 NOTE — Progress Notes (Signed)
Subjective:    Patient ID: Jose Beck, male    DOB: 1946-04-18, 70 y.o.   MRN: [id number]  HPI 70 year old right-handed male, resident of [place] with history of hypertension, coronary artery disease, TIA, and diabetes mellitus who presents for transitional care management after receiving CIR services for superior right cerebellar and small acute infarct contralateral superior left cerebellar hemisphere infarct.  DATE OF ADMISSION:  08/09/2015 DATE OF DISCHARGE:  08/17/2015 At the time of discharge, pt was encouraged follow up with PCP Dr. [name] regarding DM management.  Pt followed up and is continuing to use insulin, at he had in the hospital.  He has occasional pain in his right hip, that does not require any medication. He continues to take HTN meds. He continues to drink Etoh, but states he is trying to cut back.  He denies falls.  Mobility: Walker at home and community DME: Not needed Therapies: Outpatient PT/OT/SLP, 3-6 hours/week.  Pain Inventory Average Pain 0 Pain Right Now 0 My pain is intermittent and aching  In the last 24 hours, has pain interfered with the following? General activity 0 Relation with others 0 Enjoyment of life 0 What TIME of day is your pain at its worst? night Sleep (in general) Good  Pain is worse with: sleeping Pain improves with: no selection Relief from Meds: no selection  Mobility walk with assistance use a walker how many minutes can you walk? ? ability to climb steps?  no do you drive?  no transfers alone Do you have any goals in this area?  yes  Function retired I need assistance with the following:  toileting, meal prep, household duties and shopping  Neuro/Psych weakness trouble walking  Prior Studies transitional care  Physicians involved in your care transitional care   History reviewed. No pertinent family history. Social History   Social History  . Marital Status: Married    Spouse Name: N/A  . Number of Children: N/A  . Years of Education: N/A   Social History Main Topics  . Smoking status: Never Smoker   . Smokeless tobacco: None  . Alcohol Use: None  . Drug Use: None  . Sexual Activity: Not Asked   Other Topics Concern  . None   Social History Narrative   History reviewed. No pertinent past surgical history. History reviewed. No pertinent past medical history. BP 154/79 mmHg  Pulse 97  Resp 14  SpO2 96%  Opioid Risk Score:   Fall Risk Score:  `1  Depression screen PHQ 2/9  Depression screen PHQ 2/9 08/29/2015  Decreased Interest 0  Down, Depressed, Hopeless 0  PHQ - 2 Score 0  Altered sleeping 0  Tired, decreased energy 1  Change in appetite 0  Feeling bad or failure about yourself  0  Trouble concentrating 0  Moving slowly or fidgety/restless 2  Suicidal thoughts 0  PHQ-9 Score 3  Difficult doing work/chores Somewhat difficult   Review of Systems  Endocrine:       High blood sugar  Neurological: Negative for dizziness and weakness.  Psychiatric/Behavioral: Negative for sleep disturbance.  All other systems reviewed and are negative.     Objective:   Physical Exam General: No acute distress. Vital signs reviewed.  Psych: Mood and affect are appropriate Heart: Regular rate and rhythm no rubs murmurs or extra sounds Lungs: Clear to auscultation, breathing unlabored Abdomen: Positive bowel sounds, soft nontender to palpation, nondistended Skin: No evidence of breakdown, no evidence of rash Neurologic:  Cranial nerves II through XII intact Motor strength is 5/5 in bilateral deltoid, bicep, tricep, grip, hip flexor, knee extensors, ankle dorsiflexor and plantar flexor Sensory exam normal sensation to light touch in bilateral upper and lower extremities Dysmetria (Mild-Moderate)>ataxia RUE Musculoskeletal: PROM WNL.  No tenderness. No edema.  No TTP right hip Right FABER Neg Gait slow cadence Skin: Warm and Dry.       Assessment & Plan:  70 year old right-handed male, resident of [place] with history of hypertension, coronary artery disease, TIA, and diabetes mellitus who presents for transitional care management after receiving CIR services for superior right cerebellar and small acute infarct contralateral superior left cerebellar hemisphere infarct.  1. Superior right cerebellar and small acute infarct contralateral superior left cerebellar hemisphere infarct  Cont therapies  Cont meds   Follow up with Neurology services 09/17/2015  Cont walker for safety  2. Right hip pain  Mild, occasional and controlled at present  Likely secondary to posture during sleep  Will cont to monitor  3. Hypertension:  Cont Cardizem 360 mg daily  Encouraged pt to follow up with PCP/Cardiology regarding further adjustments to BP meds as they stay ~SBP of 160  4. Diabetes mellitus with peripheral neuropathy.   Cont Levemir  20 units twice a day    Cont to follow with PCP regarding adjustments to insulin reg  5. Alcohol abuse.   Cont to wean Etoh use  Pt states he has cut back, but still drinks relatively small amounts 5/day.   Medications reviewed Referrals reviewed All questions answered

## 2015-08-30 ENCOUNTER — Encounter: Payer: Medicare Other | Admitting: Physical Medicine & Rehabilitation

## 2015-08-30 ENCOUNTER — Encounter: Payer: Self-pay | Admitting: Occupational Therapy

## 2015-08-30 ENCOUNTER — Ambulatory Visit: Payer: Medicare Other | Admitting: Occupational Therapy

## 2015-08-30 DIAGNOSIS — R293 Abnormal posture: Secondary | ICD-10-CM

## 2015-08-30 DIAGNOSIS — R41844 Frontal lobe and executive function deficit: Secondary | ICD-10-CM

## 2015-08-30 DIAGNOSIS — R2681 Unsteadiness on feet: Secondary | ICD-10-CM | POA: Diagnosis not present

## 2015-08-30 DIAGNOSIS — R260 Ataxic gait: Secondary | ICD-10-CM | POA: Diagnosis not present

## 2015-08-30 DIAGNOSIS — R471 Dysarthria and anarthria: Secondary | ICD-10-CM | POA: Diagnosis not present

## 2015-08-30 DIAGNOSIS — R270 Ataxia, unspecified: Secondary | ICD-10-CM

## 2015-08-30 DIAGNOSIS — R278 Other lack of coordination: Secondary | ICD-10-CM | POA: Diagnosis not present

## 2015-08-30 DIAGNOSIS — M6281 Muscle weakness (generalized): Secondary | ICD-10-CM | POA: Diagnosis not present

## 2015-08-30 DIAGNOSIS — G8191 Hemiplegia, unspecified affecting right dominant side: Secondary | ICD-10-CM

## 2015-08-30 DIAGNOSIS — R262 Difficulty in walking, not elsewhere classified: Secondary | ICD-10-CM | POA: Diagnosis not present

## 2015-08-30 NOTE — Therapy (Signed)
[facility] [address] [place], [name], [address] Phone: [phone]   Fax:  [phone]  Occupational Therapy Treatment  Patient Details  Name: Connar Uhlig MRN: [id number] Date of Birth: 07-31-1945 Referring Provider: Dr. Letta Pate  Encounter Date: 08/30/2015      OT End of Session - 08/30/15 1552    Visit Number 3   Number of Visits 16   Date for OT Re-Evaluation 10/16/15   Authorization Type medicare   Authorization Time Period 60 days, Pt will need G code and PN every 10th visit   Authorization - Visit Number 3   Authorization - Number of Visits 10   OT Start Time 1401   OT Stop Time 1443   OT Time Calculation (min) 42 min   Activity Tolerance Patient tolerated treatment well      History reviewed. No pertinent past medical history.  History reviewed. No pertinent past surgical history.  There were no vitals filed for this visit.      Subjective Assessment - 08/30/15 1405    Subjective  I think he is doing better since he has come home  but he doesn't think so.   Patient is accompained by: Family member  wife   Pertinent History see epic   Patient Stated Goals To get back to normal - my speech, my toothbrush, my R leg   Currently in Pain? No/denies                      OT Treatments/Exercises (OP) - 08/30/15 0001    ADLs   Writing Addressed pre writing skills using weighted built up pen vs just built up pen . Pt with improved performance using weighted pen.  Practiced letter formation for first name with emphasis printing and making large letters. Pt with improvement immediately after tracing his name several times. Isssued homework to address this with wife's assistance.    ADL Comments Reviewd all goals with pt and wife and they are in agreement. Pt with brighter affect this week and improved safety awareness during basic mobility.    Fine Motor Coordination   Other Fine  Motor Exercises Therapueutic activities to address in hand manipulation, fine motor coordination, eye hand coordination with cues for using fingers to manipulate instead of substituting with proximal movements to orient hand to task. Pt improved with practice and repetition.  Pt with improved ability to self monitor.                   OT Short Term Goals - 08/30/15 1550    OT SHORT TERM GOAL #1   Title Pt and wife will be mod I with HEP - 09/18/2015   Status On-going   OT SHORT TERM GOAL #2   Title Pt will be mod I with toilet transfers   Status On-going   OT SHORT TERM GOAL #3   Title Pt will be mod I with shower transfers   Status On-going   OT SHORT TERM GOAL #4   Title Pt will be able to cut meat, AE prn   Status On-going   OT SHORT TERM GOAL #5   Title Pt will improve functional reaching/coordination as shown by improving score on box and blocks test by at least 10 bilaterally.   Baseline R-19blocks, L-29 blocks   Status On-going   OT SHORT TERM GOAL #6   Title Pt will demonstrate improved awareness as evidenced by being able to identify  2 cognitive deficits he is working on   Status On-going           OT Long Term Goals - 08/30/15 [place] #1   Title Pt and wife will be mod I with upgraded HEP - 10/16/2015   Status On-going   OT LONG TERM GOAL #2   Title Pt will be mod I with simple hot familiar meal prep   Status On-going   OT LONG TERM GOAL #3   Title Pt will need no more than 2 vc's for moderate complex problem sovling within the context of a functional task.   Status On-going   OT LONG TERM GOAL #4   Title Pt will demonstrate improved safety awareness with functional mobility to reach mod I level   Status On-going   OT LONG TERM GOAL #5   Title Pt will demo improved coordination for ADLs as shown by completing 9-hole peg in 70sec or less with RUE.   Baseline 104min 55.16sec   Status On-going               Plan - 08/30/15 1551     Clinical Impression Statement Pt with progress toward goals. Pt with improved affect, safety awareness and attention today.    Rehab Potential Good   Clinical Impairments Affecting Rehab Potential decreaed awareness, cognition;  wife also appears to have poor understanding of pt's cogntive deficits.    OT Frequency 2x / week   OT Duration 8 weeks   OT Treatment/Interventions Self-care/ADL training;Therapeutic exercise;Neuromuscular education;DME and/or AE instruction;Manual Therapy;Therapist, nutritional;Therapeutic activities;Cognitive remediation/compensation;Patient/family education;Balance training;Moist Heat;Ultrasound   Plan balance retraining, incorporate UE functional use and cognitive remediation into activities   Consulted and Agree with Plan of Care Patient;Family member/caregiver   Family Member Consulted wife      Patient will benefit from skilled therapeutic intervention in order to improve the following deficits and impairments:  Abnormal gait, Cardiopulmonary status limiting activity, Decreased balance, Decreased cognition, Decreased coordination, Decreased safety awareness, Decreased mobility, Decreased strength, Difficulty walking, Impaired UE functional use, Impaired sensation  Visit Diagnosis: Hemiplegia, unspecified affecting right dominant side (HCC)  Frontal lobe and executive function deficit  Ataxia, unspecified  Other lack of coordination  Abnormal posture  Unsteadiness on feet    Problem List Patient Active Problem List   Diagnosis Date Noted  . 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 Gait disturbance, 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  . CVA (cerebral infarction) 08/09/2015    Quay Burow, OTR/L 08/30/2015, 3:54 PM  [facility] [address] [place], [name], [address] Phone: [phone]   Fax:  [phone]  Name: Kareem Einstein MRN: [id number] Date of Birth: [birth year]

## 2015-09-03 ENCOUNTER — Encounter: Payer: Self-pay | Admitting: Physical Therapy

## 2015-09-03 ENCOUNTER — Ambulatory Visit: Payer: Medicare Other | Admitting: Speech Pathology

## 2015-09-03 ENCOUNTER — Ambulatory Visit: Payer: Medicare Other | Admitting: Occupational Therapy

## 2015-09-03 ENCOUNTER — Ambulatory Visit: Payer: Medicare Other | Admitting: Physical Therapy

## 2015-09-03 DIAGNOSIS — R278 Other lack of coordination: Secondary | ICD-10-CM

## 2015-09-03 DIAGNOSIS — R41844 Frontal lobe and executive function deficit: Secondary | ICD-10-CM

## 2015-09-03 DIAGNOSIS — R471 Dysarthria and anarthria: Secondary | ICD-10-CM | POA: Diagnosis not present

## 2015-09-03 DIAGNOSIS — R2689 Other abnormalities of gait and mobility: Secondary | ICD-10-CM

## 2015-09-03 DIAGNOSIS — R260 Ataxic gait: Secondary | ICD-10-CM | POA: Diagnosis not present

## 2015-09-03 DIAGNOSIS — R2681 Unsteadiness on feet: Secondary | ICD-10-CM | POA: Diagnosis not present

## 2015-09-03 DIAGNOSIS — R262 Difficulty in walking, not elsewhere classified: Secondary | ICD-10-CM | POA: Diagnosis not present

## 2015-09-03 DIAGNOSIS — M6281 Muscle weakness (generalized): Secondary | ICD-10-CM

## 2015-09-03 DIAGNOSIS — R293 Abnormal posture: Secondary | ICD-10-CM

## 2015-09-03 DIAGNOSIS — R270 Ataxia, unspecified: Secondary | ICD-10-CM

## 2015-09-03 DIAGNOSIS — G8191 Hemiplegia, unspecified affecting right dominant side: Secondary | ICD-10-CM

## 2015-09-03 DIAGNOSIS — R208 Other disturbances of skin sensation: Secondary | ICD-10-CM

## 2015-09-03 NOTE — Therapy (Signed)
[facility] [address] [place], [name], [address] Phone: [phone]   Fax:  [phone]  Occupational Therapy Treatment  Patient Details  Name: Jose Beck MRN: [place]:[id number] Date of Birth: 30-Mar-1946 Referring Provider: Dr. Letta Pate  Encounter Date: 09/03/2015      OT End of Session - 09/03/15 1157    Visit Number 4   Number of Visits 16  4/10 G   Date for OT Re-Evaluation 10/16/15   Authorization Type medicare   Authorization Time Period 60 days, Pt will need G code and PN every 10th visit   Authorization - Visit Number 4   Authorization - Number of Visits 10   OT Start Time 1102   OT Stop Time 1146   OT Time Calculation (min) 44 min   Activity Tolerance Patient tolerated treatment well   Behavior During Therapy [facility] for tasks assessed/performed      No past medical history on file.  No past surgical history on file.  There were no vitals filed for this visit.      Subjective Assessment - 09/03/15 1105    Subjective  Pt reports no changes since last visit. "I thought I was having Santiago Glad today"   Pertinent History see epic   Patient Stated Goals To get back to normal - my speech, my toothbrush, my R leg   Currently in Pain? No/denies   Pain Score 0-No pain   Multiple Pain Sites No                      OT Treatments/Exercises (OP) - 09/03/15 0001    ADLs   Overall ADLs Pt spouse reports that he is bathing himself w/o assistance and  pt reports performing handwriting and fine motor activities at home. Encouraged using right hand to fasten and button etc.   Writing Handwriting activities for tracing, patterns, letters etc Right hand.coordination to remove pennies from yellow putty x14 with min-mod difficulty noted. Using right hand to perform pinch and prehension to remove pennies x14 w/ moderate difficulty noted and vc's required for shoulder and UE positioning & avoidance of  compensatory patterns.  Squares, maze, letters today with cylindrical foam right   ADL Education Given Yes  Issued handouts for fine motor/coord/writing activity @ home   Fine Motor Coordination   Other Fine Motor Exercises Therapueutic activities to address in hand manipulation, fine motor coordination, eye hand coordination with cues for using fingers to manipulate instead of substituting with proximal movements to orient hand to task. Pt improved with practice and repetition.  Pt with improved ability to self monitor.                 OT Education - 09/03/15 1156    Education provided Yes   Education Details OT: Handouts for coordination/handwriting activities at home   Person(s) Educated Patient;Spouse   Methods Explanation;Demonstration;Verbal cues;Handout   Comprehension Verbalized understanding;Returned demonstration;Need further instruction          OT Short Term Goals - 08/30/15 1550    OT SHORT TERM GOAL #1   Title Pt and wife will be mod I with HEP - 09/18/2015   Status On-going   OT SHORT TERM GOAL #2   Title Pt will be mod I with toilet transfers   Status On-going   OT SHORT TERM GOAL #3   Title Pt will be mod I with shower transfers   Status On-going   OT SHORT TERM GOAL #  4   Title Pt will be able to cut meat, AE prn   Status On-going   OT SHORT TERM GOAL #5   Title Pt will improve functional reaching/coordination as shown by improving score on box and blocks test by at least 10 bilaterally.   Baseline R-19blocks, L-29 blocks   Status On-going   OT SHORT TERM GOAL #6   Title Pt will demonstrate improved awareness as evidenced by being able to identify 2 cognitive deficits he is working on   Status On-going           OT Long Term Goals - 08/30/15 [place] #1   Title Pt and wife will be mod I with upgraded HEP - 10/16/2015   Status On-going   OT LONG TERM GOAL #2   Title Pt will be mod I with simple hot familiar meal prep   Status  On-going   OT LONG TERM GOAL #3   Title Pt will need no more than 2 vc's for moderate complex problem sovling within the context of a functional task.   Status On-going   OT LONG TERM GOAL #4   Title Pt will demonstrate improved safety awareness with functional mobility to reach mod I level   Status On-going   OT LONG TERM GOAL #5   Title Pt will demo improved coordination for ADLs as shown by completing 9-hole peg in 70sec or less with RUE.   Baseline 19min 55.16sec   Status On-going               Plan - 09/03/15 1158    Clinical Impression Statement Progressing toward plan of care and goals. Pt demonstrates improved awareness of compensatory patterns during functional use of RUE, but still benefits from VC's w/ noted improvement over session today.    Rehab Potential Good   Clinical Impairments Affecting Rehab Potential decreaed awareness, cognition;  wife also appears to have poor understanding of pt's cogntive deficits.    OT Frequency 2x / week   OT Duration 8 weeks   OT Treatment/Interventions Self-care/ADL training;Therapeutic exercise;Neuromuscular education;DME and/or AE instruction;Manual Therapy;Therapist, nutritional;Therapeutic activities;Cognitive remediation/compensation;Patient/family education;Balance training;Moist Heat;Ultrasound   Plan Balance retraining, incorporate R UE functional use and cognitive remediation into activities.   Consulted and Agree with Plan of Care Patient;Family member/caregiver   Family Member Consulted wife      Patient will benefit from skilled therapeutic intervention in order to improve the following deficits and impairments:  Abnormal gait, Cardiopulmonary status limiting activity, Decreased balance, Decreased cognition, Decreased coordination, Decreased safety awareness, Decreased mobility, Decreased strength, Difficulty walking, Impaired UE functional use, Impaired sensation  Visit Diagnosis: Other lack of  coordination  Ataxia, unspecified  Hemiplegia, unspecified affecting right dominant side (HCC)  Frontal lobe and executive function deficit  Muscle weakness (generalized)    Problem List Patient Active Problem List   Diagnosis Date Noted  . 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 Gait disturbance, 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  . CVA (cerebral infarction) 08/09/2015    Almyra Deforest, OTR/L 09/03/2015, 12:02 PM  [facility] [address] [place], [name], [address] Phone: [phone]   Fax:  [phone]  Name: Jose Beck MRN: [place]:[id number] Date of Birth: Sep 10, 1945

## 2015-09-03 NOTE — Patient Instructions (Signed)
Handouts issued for home performance of coordination/handwriting activities. Stressed avoidance of compensatory patterns during functional activity especially right should hike and moving elbow. Pt/spouse verbalized understanding.

## 2015-09-03 NOTE — Therapy (Signed)
[facility] [address] [place], [name], [address] Phone: [phone]   Fax:  [phone]  Speech Language Pathology Treatment  Patient Details  Name: Jose Beck MRN: [id number] Date of Birth: December 27, 1945 Referring Provider: Dr, Lupe Carney  Encounter Date: 09/03/2015      End of Session - 09/03/15 1104    Visit Number 2   Number of Visits 17   Date for SLP Re-Evaluation 10/23/15   SLP Start Time 1016   SLP Stop Time  1058   SLP Time Calculation (min) 42 min      No past medical history on file.  No past surgical history on file.  There were no vitals filed for this visit.      Subjective Assessment - 09/03/15 1023    Subjective "I did it twice"  "We shoulld have done more   Currently in Pain? No/denies               ADULT SLP TREATMENT - 09/03/15 1024    General Information   Behavior/Cognition Alert;Cooperative;Pleasant mood   Treatment Provided   Treatment provided Cognitive-Linquistic   Pain Assessment   Pain Assessment No/denies pain   Cognitive-Linquistic Treatment   Treatment focused on Dysarthria   Skilled Treatment Facilatated correct completetion of HEP - pt had done them twice since eva. - he required occasional instructions and modeling  for over ennunciation and slow rate.  Compensations for  dysarthria faciliated with repetition of multisyllabic words, then generating sentences with the words. Pt required usual min to mod A for slow rate and over artciulation. Pt with awareness of dysarthric errors 50% of the time with self correction.  Pt required  usual verbal cues to correct errors. Linguistic complexity, utterance length with multiple meaning sentences.    Assessment / Recommendations / Plan   Plan Continue with current plan of care   Progression Toward Goals   Progression toward goals Progressing toward goals          SLP Education - 09/03/15 1101    Education provided Yes   Education Details compensations for dysarthria, error awareness,   Person(s) Educated Patient;Spouse   Methods Explanation;Demonstration;Verbal cues;Handout   Comprehension Verbalized understanding;Returned demonstration;Need further instruction          SLP Short Term Goals - 09/03/15 1104    SLP SHORT TERM GOAL #1   Title Pt will perform dysarthria HEP with mod I   Time 4   Period Weeks   Status On-going   SLP SHORT TERM GOAL #2   Title Pt will utilize compensations for dysarthira during structured tasks with rare min A   Time 4   Period Weeks   Status On-going   SLP SHORT TERM GOAL #3   Title Pt will demonstrate utterance length of 5-7 words during structured speech/descriptive tasks with rare min A   Time 4   Period Weeks   Status On-going          SLP Long Term Goals - 09/03/15 1104    SLP LONG TERM GOAL #1   Title Pt will be 95% intellgible during simple conversation of 10 minutes with rare min A   Time 8   Period Weeks   Status On-going   SLP LONG TERM GOAL #2   Title Pt will participate in mildly complex conversation over 12 minutes with less than 3 requests for clarification/repeair communication breakdown.    Time 8   Period Weeks   Status  On-going   SLP LONG TERM GOAL #3   Title Pt will utilize compensations for short term memory to recall details, lists, messages with 85% accuracy and rare min A.   Time 8   Period Weeks   Status On-going          Plan - 09/03/15 1102    Clinical Impression Statement Pt required verbal cues/instructions for awareness of dysarthric errors and to employ compensations for dysarthria. Continue skilled ST to maximize intellgibility, awareness and utterance length.   Speech Therapy Frequency 2x / week   Duration --  8 weeks   Treatment/Interventions Cognitive reorganization;Internal/external aids;Compensatory strategies;SLP instruction and feedback;Patient/family education;Functional  tasks;Language facilitation   Potential to Achieve Goals Good   Potential Considerations Ability to learn/carryover information   Consulted and Agree with Plan of Care Patient;Family member/caregiver   Family Member Consulted spouse      Patient will benefit from skilled therapeutic intervention in order to improve the following deficits and impairments:   Dysarthria and anarthria    Problem List Patient Active Problem List   Diagnosis Date Noted  . 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 Gait disturbance, 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  . CVA (cerebral infarction) 08/09/2015    Lovvorn, Annye Rusk MS, CCC-SLP 09/03/2015, 11:05 AM  [facility] [address] [place], [name], [address] Phone: [phone]   Fax:  [phone]   Name: Jose Beck MRN: [id number] Date of Birth: January 15, 1946

## 2015-09-03 NOTE — Patient Instructions (Signed)
At counter with support for balance and with spouse with you: Perform each of these for 1-2 laps, resting between. 1-2 times a day.   "I love a Parade" Lift    High knee marching (slowly) forward and then backwards along counter top.  http://gt2.exer.us/344   Copyright  VHI. All rights reserved.  Feet Heel-Toe "Tandem"    Arms as needed for balance on counter top: walk a straight line forward and then a straight line backwards along the counter top.  Copyright  VHI. All rights reserved.  Walking on Toes    Arms on counter for support: walk forward on your toes, keeping heels up as high as possible. NO BACKWARDS WITH THIS ONE.  Copyright  VHI. All rights reserved.   Perform these on the sofa or bed: 1-2 times a day. Reps as stated.      Bridging    Arms across chest: Slowly raise buttocks from floor, keeping stomach tight. Hold for 5 seconds. Repeat __10__ times per set.   http://orth.exer.us/1096   Copyright  VHI. All rights reserved.   Bridging (Single Leg)    Lie on back with feet shoulder width apart and left leg straight on bed, right leg bent up. Lift hips toward the ceiling while keeping leg straight. Hold _3-5__ seconds. Repeat __10_ times.  http://gt2.exer.us/358   Copyright  VHI. All rights reserved.  Hip Flexor Stretch    Lying on back right side near edge of bed, bend legs with feet flat on bed. Lift right leg off bed to touch right foot to floor and then lift leg back up to put foot back onto bed/sofa. Keep knee bent throughout the movement. Perform 10 reps.   http://gt2.exer.us/346   Copyright  VHI. All rights reserved.  Functional Quadriceps: Sit to Stand    Sit on edge of chair, feet flat on floor. Stand upright, extending knees fully. Repeat 10 times per set..  http://orth.exer.us/734   Copyright  VHI. All rights reserved.

## 2015-09-04 NOTE — Therapy (Signed)
[facility] [address] [place], [name], [address] Phone: [phone]   Fax:  [phone]  Physical Therapy Treatment  Patient Details  Name: Jose Beck MRN: [place]:[id number] Date of Birth: January 26, 1946 Referring Provider: Dr. Alysia Penna  Encounter Date: 09/03/2015      PT End of Session - 09/03/15 1324    Visit Number 2   Number of Visits 16   Date for PT Re-Evaluation 10/16/15   PT Start Time 1320   PT Stop Time 1400   PT Time Calculation (min) 40 min   Equipment Utilized During Treatment Gait belt   Activity Tolerance Patient tolerated treatment well   Behavior During Therapy [facility] for tasks assessed/performed      History reviewed. No pertinent past medical history.  History reviewed. No pertinent past surgical history.  There were no vitals filed for this visit.      Subjective Assessment - 09/03/15 1323    Subjective No new compaints. Both pt and spouse report their main goals at this time are the stairs at the beach house,   Patient is accompained by: Family member  spouse   Pertinent History HTN, HLD, DM, CAD   Limitations Standing;Walking;House hold activities   Patient Stated Goals improve function and mobility; wants to go to beach in May with many stairs to navigate   Currently in Pain? No/denies   Pain Score 0-No pain     Treatment: Educated pt and spouse on exercises for LE strengthening and balance for home. Refer to pt instructions for full details. Min guard assist with UE support on counter top with standing balance activities.           PT Education - 09/03/15 1356    Education provided Yes   Education Details PT HEP: for strengthening and balance   Person(s) Educated Patient   Methods Explanation;Demonstration;Verbal cues;Handout   Comprehension Verbalized understanding;Returned demonstration;Verbal cues required;Need further instruction          PT Short Term  Goals - 08/21/15 0941    PT SHORT TERM GOAL #1   Title verbalize understanding of CVA risk factors/warning signs to decrease risk of reinjury (09/18/15)   Time 4   Period Weeks   Status New   PT SHORT TERM GOAL #2   Title improve BERG balance score to >/= 38/56 for improved balance    PT SHORT TERM GOAL #3   Title improve timed up and go to < 30 sec with LRAD for improved mobility and function   PT SHORT TERM GOAL #4   Title ambulate > 250' with LRAD modified independent on indoor/paved outdoor surfaces for improved function   PT SHORT TERM GOAL #5   Title negotiate > 8 steps with 1 handrail and supervision for improved function and ability to negotiate stairs at [employer]           PT Long Term Goals - 08/21/15 0943    PT LONG TERM GOAL #1   Title independent with HEP (10/16/15)   Time 8   Period Weeks   Status New   PT LONG TERM GOAL #2   Title improve BERG balance score to >/= 45/56 for decreased fall risk   PT LONG TERM GOAL #3   Title improve gait velocity to > 2.62 ft/sec for improved function and mobility   PT LONG TERM GOAL #4   Title improve timed up and go with LRAD to < 25 sec for improved function and  mobility   PT LONG TERM GOAL #5   Title ambulate > 500' on various indoor/outdoor surfaces with LRAD modified independent for improved function and mobility.            Plan - 09/03/15 1326    Clinical Impression Statement Today's session focused on establishing an HEP. No issues were reported with performance in session today. Pt's spouse educated as well. Pt is making steady progress toward goals.   Rehab Potential Good   PT Frequency 2x / week   PT Duration 8 weeks   PT Treatment/Interventions ADLs/Self Care Home Management;Electrical Stimulation;Patient/family education;Neuromuscular re-education;Balance training;Therapeutic exercise;Therapeutic activities;Functional mobility training;Stair training;Gait training;Vestibular   PT Next Visit Plan CVA ed; gait,  continue with strengthening and balance activities working toward pt/spouse goal of stair negotiation for upcoming beach trip.   Consulted and Agree with Plan of Care Patient;Family member/caregiver   Family Member Consulted wife      Patient will benefit from skilled therapeutic intervention in order to improve the following deficits and impairments:  Abnormal gait, Decreased balance, Decreased coordination, Decreased mobility, Postural dysfunction, Decreased strength, Decreased activity tolerance  Visit Diagnosis: Muscle weakness (generalized)  Abnormal posture  Unsteadiness on feet  Other abnormalities of gait and mobility  Other disturbances of skin sensation     Problem List Patient Active Problem List   Diagnosis Date Noted  . 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 Gait disturbance, 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  . CVA (cerebral infarction) 08/09/2015    Willow Ora, PTA, [facility] [address] [place], [place] [address] [phone] 09/04/2015, 1:10 PM   Name: Jose Beck MRN: [place]:[id number] Date of Birth: 11-08-1945

## 2015-09-06 ENCOUNTER — Encounter: Payer: Self-pay | Admitting: Physical Therapy

## 2015-09-06 ENCOUNTER — Ambulatory Visit: Payer: Medicare Other | Admitting: Physical Therapy

## 2015-09-06 ENCOUNTER — Encounter: Payer: Self-pay | Admitting: Occupational Therapy

## 2015-09-06 ENCOUNTER — Ambulatory Visit: Payer: Medicare Other

## 2015-09-06 ENCOUNTER — Ambulatory Visit: Payer: Medicare Other | Admitting: Occupational Therapy

## 2015-09-06 DIAGNOSIS — R293 Abnormal posture: Secondary | ICD-10-CM

## 2015-09-06 DIAGNOSIS — R278 Other lack of coordination: Secondary | ICD-10-CM | POA: Diagnosis not present

## 2015-09-06 DIAGNOSIS — R260 Ataxic gait: Secondary | ICD-10-CM | POA: Diagnosis not present

## 2015-09-06 DIAGNOSIS — M6281 Muscle weakness (generalized): Secondary | ICD-10-CM

## 2015-09-06 DIAGNOSIS — G8191 Hemiplegia, unspecified affecting right dominant side: Secondary | ICD-10-CM

## 2015-09-06 DIAGNOSIS — R471 Dysarthria and anarthria: Secondary | ICD-10-CM | POA: Diagnosis not present

## 2015-09-06 DIAGNOSIS — R41844 Frontal lobe and executive function deficit: Secondary | ICD-10-CM

## 2015-09-06 DIAGNOSIS — R2689 Other abnormalities of gait and mobility: Secondary | ICD-10-CM

## 2015-09-06 DIAGNOSIS — R2681 Unsteadiness on feet: Secondary | ICD-10-CM | POA: Diagnosis not present

## 2015-09-06 DIAGNOSIS — R208 Other disturbances of skin sensation: Secondary | ICD-10-CM

## 2015-09-06 DIAGNOSIS — R262 Difficulty in walking, not elsewhere classified: Secondary | ICD-10-CM | POA: Diagnosis not present

## 2015-09-06 NOTE — Therapy (Signed)
[facility] [address] [place], [name], [address] Phone: [phone]   Fax:  [phone]  Occupational Therapy Treatment  Patient Details  Name: Jose Beck MRN: [id number] Date of Birth: 12/07/1945 Referring Provider: Dr. Letta Pate  Encounter Date: 09/06/2015      OT End of Session - 09/06/15 1216    Visit Number 5   Number of Visits 16   Date for OT Re-Evaluation 10/16/15   Authorization Type medicare   Authorization Time Period 60 days, Pt will need G code and PN every 10th visit   Authorization - Visit Number 5   OT Start Time 0932   OT Stop Time 1015   OT Time Calculation (min) 43 min   Activity Tolerance Patient tolerated treatment well      History reviewed. No pertinent past medical history.  History reviewed. No pertinent past surgical history.  There were no vitals filed for this visit.      Subjective Assessment - 09/06/15 0940    Subjective  I usually eat with my left hand   Patient is accompained by: Family member  wife   Pertinent History see epic   Patient Stated Goals To get back to normal - my speech, my toothbrush, my R leg   Currently in Pain? No/denies                      OT Treatments/Exercises (OP) - 09/06/15 1211    ADLs   Eating Pt and wife report pt is having difficulty manipulating utensils and eating with his right dominant hand. Upon observation pt appears apraxic as well as demonsrates dystonic posturing with R hand.  Discussed and practiced strategies with pt and wife including how wife can therapeutically cue and assist pt as needed instead of 'doing" for the pt.  Pt also demonstrates motor planning issues with basic mobility and other functioanl tasks with RUE.     Functional Mobility Addressed toilet transfers - pt in the clinic requires supervision due to unfamilar envrionment as well as apraxia however pt at home is able to use hall bathroom with  distant supevision only at this time.  Pt and wife report no falls.    Neurological Re-education Exercises   Other Exercises 1 Neuro re ed to address RUE functional reach and use in activity.                  OT Short Term Goals - 09/06/15 1215    OT SHORT TERM GOAL #1   Title Pt and wife will be mod I with HEP - 09/18/2015   Status On-going   OT SHORT TERM GOAL #2   Title Pt will be mod I with toilet transfers   Status On-going   OT SHORT TERM GOAL #3   Title Pt will be mod I with shower transfers   Status On-going   OT SHORT TERM GOAL #4   Title Pt will be able to cut meat, AE prn   Status On-going   OT SHORT TERM GOAL #5   Title Pt will improve functional reaching/coordination as shown by improving score on box and blocks test by at least 10 bilaterally.   Baseline R-19blocks, L-29 blocks   Status On-going   OT SHORT TERM GOAL #6   Title Pt will demonstrate improved awareness as evidenced by being able to identify 2 cognitive deficits he is working on   Status On-going  OT Long Term Goals - 09/06/15 1215    OT LONG TERM GOAL #1   Title Pt and wife will be mod I with upgraded HEP - 10/16/2015   Status On-going   OT LONG TERM GOAL #2   Title Pt will be mod I with simple hot familiar meal prep   Status On-going   OT LONG TERM GOAL #3   Title Pt will need no more than 2 vc's for moderate complex problem sovling within the context of a functional task.   Status On-going   OT LONG TERM GOAL #4   Title Pt will demonstrate improved safety awareness with functional mobility to reach mod I level   Status On-going   OT LONG TERM GOAL #5   Title Pt will demo improved coordination for ADLs as shown by completing 9-hole peg in 70sec or less with RUE.   Baseline 15min 55.16sec   Status On-going               Plan - 09/06/15 1215    Clinical Impression Statement Pt progressing toward goals. Pt expresses frustration at times.    Rehab Potential Good    Clinical Impairments Affecting Rehab Potential decreaed awareness, cognition;  wife also appears to have poor understanding of pt's cogntive deficits.    OT Frequency 2x / week   OT Duration 8 weeks   OT Treatment/Interventions Self-care/ADL training;Therapeutic exercise;Neuromuscular education;DME and/or AE instruction;Manual Therapy;Therapist, nutritional;Therapeutic activities;Cognitive remediation/compensation;Patient/family education;Balance training;Moist Heat;Ultrasound   Plan balance retraining, incoprorate RUE into function, cognition   Consulted and Agree with Plan of Care Patient;Family member/caregiver   Family Member Consulted wife      Patient will benefit from skilled therapeutic intervention in order to improve the following deficits and impairments:  Abnormal gait, Cardiopulmonary status limiting activity, Decreased balance, Decreased cognition, Decreased coordination, Decreased safety awareness, Decreased mobility, Decreased strength, Difficulty walking, Impaired UE functional use, Impaired sensation  Visit Diagnosis: Muscle weakness (generalized)  Other lack of coordination  Hemiplegia, unspecified affecting right dominant side (HCC)  Frontal lobe and executive function deficit  Other abnormalities of gait and mobility  Other disturbances of skin sensation    Problem List Patient Active Problem List   Diagnosis Date Noted  . 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 Gait disturbance, 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  . CVA (cerebral infarction) 08/09/2015    Quay Burow, OTR/L 09/06/2015, 12:17 PM  [facility] [address] [place], [name], [address] Phone: [phone]   Fax:  [phone]  Name: Jose Beck MRN: [place]:[id number] Date of Birth: 02-23-1946

## 2015-09-06 NOTE — Therapy (Signed)
[facility] [address] [place], [name], [address] Phone: [phone]   Fax:  [phone]  Speech Language Pathology Treatment  Patient Details  Name: Jose Beck MRN: [place]:[id number] Date of Birth: June 03, 1945 Referring Provider: Dr, Lupe Carney  Encounter Date: 09/06/2015      End of Session - 09/06/15 1317    Visit Number 3   Number of Visits 17   Date for SLP Re-Evaluation 10/23/15   SLP Start Time 1017   SLP Stop Time  1100   SLP Time Calculation (min) 43 min   Activity Tolerance Patient tolerated treatment well      No past medical history on file.  No past surgical history on file.  There were no vitals filed for this visit.      Subjective Assessment - 09/06/15 1025    Subjective "It was a lot." pt, re: homework (HEP)               ADULT SLP TREATMENT - 09/06/15 1026    General Information   Behavior/Cognition Alert;Cooperative;Pleasant mood   Treatment Provided   Treatment provided Cognitive-Linquistic   Cognitive-Linquistic Treatment   Treatment focused on Dysarthria   Skilled Treatment SLP reviewed pt's HEP with him. Rhyming word sentences completed with occasional min mod A for overartic and reduced speed. With tongue twisters, mod cues usually for reduced rate and overartic were necessary. With tongue twister phrases, pt req'd min-mod A rarely for overartic and slowed rate. SLP educated/re-educated pt and wife on rationale behind slower rate and overartic. Conversation with pt re: occupation req'd min A from SLP occasionally for compensations. Wife stated pt was more intelligible/speech more clear than in home environment. SLP stressed pt would have to cont to work with HEP to make overartic and slow rate habitual and to strengthen speech musculature.   Assessment / Recommendations / Plan   Plan Continue with current plan of care   Progression Toward Goals   Progression toward  goals Progressing toward goals          SLP Education - 09/06/15 1317    Education provided Yes   Education Details HEP for dysarthria, rationale for HEP   Person(s) Educated Patient;Spouse   Methods Explanation;Demonstration;Verbal cues   Comprehension Verbalized understanding;Returned demonstration;Verbal cues required          SLP Short Term Goals - 09/03/15 1104    SLP SHORT TERM GOAL #1   Title Pt will perform dysarthria HEP with mod I   Time 4   Period Weeks   Status On-going   SLP SHORT TERM GOAL #2   Title Pt will utilize compensations for dysarthira during structured tasks with rare min A   Time 4   Period Weeks   Status On-going   SLP SHORT TERM GOAL #3   Title Pt will demonstrate utterance length of 5-7 words during structured speech/descriptive tasks with rare min A   Time 4   Period Weeks   Status On-going          SLP Long Term Goals - 09/03/15 1104    SLP LONG TERM GOAL #1   Title Pt will be 95% intellgible during simple conversation of 10 minutes with rare min A   Time 8   Period Weeks   Status On-going   SLP LONG TERM GOAL #2   Title Pt will participate in mildly complex conversation over 12 minutes with less than 3 requests for clarification/repeair communication breakdown.  Time 8   Period Weeks   Status On-going   SLP LONG TERM GOAL #3   Title Pt will utilize compensations for short term memory to recall details, lists, messages with 85% accuracy and rare min A.   Time 8   Period Weeks   Status On-going          Plan - 09/06/15 1318    Clinical Impression Statement Pt required verbal cues/instructions for awareness of dysarthric errors and to employ compensations for dysarthria. Continue skilled ST to maximize intellgibility, awareness and utterance length.   Speech Therapy Frequency 2x / week   Duration --  8 weeks   Treatment/Interventions Cognitive reorganization;Internal/external aids;Compensatory strategies;SLP instruction and  feedback;Patient/family education;Functional tasks;Language facilitation   Potential to Achieve Goals Good   Potential Considerations Ability to learn/carryover information   Family Member Consulted spouse      Patient will benefit from skilled therapeutic intervention in order to improve the following deficits and impairments:   Dysarthria and anarthria    Problem List Patient Active Problem List   Diagnosis Date Noted  . 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 Gait disturbance, 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  . CVA (cerebral infarction) 08/09/2015    [facility] ,[place], [place]  09/06/2015, 1:19 PM  [facility] [address] [place], [name], [address] Phone: [phone]   Fax:  [phone]   Name: Jose Beck MRN: [id number] Date of Birth: Mar 15, 1946

## 2015-09-07 NOTE — Therapy (Signed)
[facility] [address] [place], [name], [address] Phone: [phone]   Fax:  [phone]  Physical Therapy Treatment  Patient Details  Name: Jose Beck MRN: [id number] Date of Birth: May 18, 1945 Referring Provider: Dr. Alysia Penna  Encounter Date: 09/06/2015      PT End of Session - 09/06/15 0805    Visit Number 3   Number of Visits 16   Date for PT Re-Evaluation 10/16/15   PT Start Time 0802   PT Stop Time 0845   PT Time Calculation (min) 43 min   Equipment Utilized During Treatment Gait belt   Activity Tolerance Patient tolerated treatment well   Behavior During Therapy [facility] for tasks assessed/performed      History reviewed. No pertinent past medical history.  History reviewed. No pertinent past surgical history.  There were no vitals filed for this visit.      Subjective Assessment - 09/06/15 0804    Subjective No new complaints. Doing HEP, difficulty with walking forward on toes. No falls. Spouse reports he walked a few steps without his walker yesterday.   Patient is accompained by: Family member  spouse   Pertinent History HTN, HLD, DM, CAD   Limitations Standing;Walking;House hold activities   Currently in Pain? No/denies   Pain Score 0-No pain            [facility] Adult PT Treatment/Exercise - 09/06/15 0807    Transfers   Transfers Sit to Stand;Stand to Sit   Sit to Stand 6: Modified independent (Device/Increase time);With upper extremity assist;From bed;With armrests;From chair/3-in-1   Stand to Sit 6: Modified independent (Device/Increase time);With upper extremity assist;With armrests;To bed;To chair/3-in-1   Ambulation/Gait   Ambulation/Gait Yes   Ambulation/Gait Assistance 4: Min assist   Ambulation/Gait Assistance Details 45 feet with LBQC, 80 feet with HHA , 115 feet cane with rubber tip   Ambulation Distance (Feet) 230 Feet   Assistive device Large base quad  cane;Straight cane;1 person hand held assist  straight cane with rubber tip   Gait Pattern Step-through pattern;Decreased step length - right;Decreased stance time - right;Decreased stride length;Wide base of support;Trunk flexed  slight trunk flexion   Ambulation Surface Level;Indoor   Stairs Yes   Stairs Assistance 4: Min assist;4: Min guard   Stairs Assistance Details (indicate cue type and reason) blocked practice with 2 rails, 1 rail/cane combo   Number of Stairs 20   Height of Stairs 6   Door Management --   Knee/Hip Exercises: Standing   Forward Step Up Both;1 set;10 reps;Hand Hold: 2;Step Height: 6";Limitations   Step Down Both;1 set;10 reps;Hand Hold: 2;Step Height: 6";Limitations             PT Short Term Goals - 08/21/15 0941    PT SHORT TERM GOAL #1   Title verbalize understanding of CVA risk factors/warning signs to decrease risk of reinjury (09/18/15)   Time 4   Period Weeks   Status New   PT SHORT TERM GOAL #2   Title improve BERG balance score to >/= 38/56 for improved balance    PT SHORT TERM GOAL #3   Title improve timed up and go to < 30 sec with LRAD for improved mobility and function   PT SHORT TERM GOAL #4   Title ambulate > 250' with LRAD modified independent on indoor/paved outdoor surfaces for improved function   PT SHORT TERM GOAL #5   Title negotiate > 8 steps with 1 handrail and supervision  for improved function and ability to negotiate stairs at [place] - 08/21/15 0943    PT LONG TERM GOAL #1   Title independent with HEP (10/16/15)   Time 8   Period Weeks   Status New   PT LONG TERM GOAL #2   Title improve BERG balance score to >/= 45/56 for decreased fall risk   PT LONG TERM GOAL #3   Title improve gait velocity to > 2.62 ft/sec for improved function and mobility   PT LONG TERM GOAL #4   Title improve timed up and go with LRAD to < 25 sec for improved function and mobility   PT LONG TERM GOAL #5   Title  ambulate > 500' on various indoor/outdoor surfaces with LRAD modified independent for improved function and mobility.            Plan - 09/06/15 0806    Clinical Impression Statement Today's session addressed stair traiining and gait with mulltiple devices. Pt less stable with quad cane vs straight cane or no device. Needs continued practice with this, safest wtih walker at this time. Pt is making steady progres toward goals   Rehab Potential Good   PT Frequency 2x / week   PT Duration 8 weeks   PT Treatment/Interventions ADLs/Self Care Home Management;Electrical Stimulation;Patient/family education;Neuromuscular re-education;Balance training;Therapeutic exercise;Therapeutic activities;Functional mobility training;Stair training;Gait training;Vestibular   PT Next Visit Plan CVA ed; gait, continue with strengthening and balance activities working toward pt/spouse goal of stair negotiation for upcoming beach trip.   Consulted and Agree with Plan of Care Patient;Family member/caregiver   Family Member Consulted wife      Patient will benefit from skilled therapeutic intervention in order to improve the following deficits and impairments:  Abnormal gait, Decreased balance, Decreased coordination, Decreased mobility, Postural dysfunction, Decreased strength, Decreased activity tolerance  Visit Diagnosis: Other abnormalities of gait and mobility  Other disturbances of skin sensation  Abnormal posture  Unsteadiness on feet  Ataxic gait     Problem List Patient Active Problem List   Diagnosis Date Noted  . 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 Gait disturbance, 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  . CVA (cerebral infarction) 08/09/2015    Willow Ora, PTA, [facility] [address], [place], [place] [address] [phone] 09/07/2015, 2:14 PM   Name: Jose Beck MRN: [id number] Date of Birth: 11-11-1945

## 2015-09-10 ENCOUNTER — Ambulatory Visit: Payer: Medicare Other | Admitting: Physical Therapy

## 2015-09-10 ENCOUNTER — Encounter: Payer: Self-pay | Admitting: Occupational Therapy

## 2015-09-10 ENCOUNTER — Ambulatory Visit: Payer: Medicare Other | Attending: Physical Medicine & Rehabilitation | Admitting: Occupational Therapy

## 2015-09-10 ENCOUNTER — Ambulatory Visit: Payer: Medicare Other

## 2015-09-10 DIAGNOSIS — R41841 Cognitive communication deficit: Secondary | ICD-10-CM | POA: Insufficient documentation

## 2015-09-10 DIAGNOSIS — R2689 Other abnormalities of gait and mobility: Secondary | ICD-10-CM

## 2015-09-10 DIAGNOSIS — R260 Ataxic gait: Secondary | ICD-10-CM | POA: Diagnosis not present

## 2015-09-10 DIAGNOSIS — R293 Abnormal posture: Secondary | ICD-10-CM | POA: Diagnosis not present

## 2015-09-10 DIAGNOSIS — R471 Dysarthria and anarthria: Secondary | ICD-10-CM | POA: Insufficient documentation

## 2015-09-10 DIAGNOSIS — R41844 Frontal lobe and executive function deficit: Secondary | ICD-10-CM | POA: Diagnosis not present

## 2015-09-10 DIAGNOSIS — G8191 Hemiplegia, unspecified affecting right dominant side: Secondary | ICD-10-CM | POA: Diagnosis not present

## 2015-09-10 DIAGNOSIS — R208 Other disturbances of skin sensation: Secondary | ICD-10-CM | POA: Diagnosis not present

## 2015-09-10 DIAGNOSIS — R278 Other lack of coordination: Secondary | ICD-10-CM | POA: Insufficient documentation

## 2015-09-10 DIAGNOSIS — M6281 Muscle weakness (generalized): Secondary | ICD-10-CM | POA: Diagnosis not present

## 2015-09-10 DIAGNOSIS — R2681 Unsteadiness on feet: Secondary | ICD-10-CM | POA: Insufficient documentation

## 2015-09-10 NOTE — Patient Instructions (Signed)
  Please complete the assigned speech therapy homework prior your next session.

## 2015-09-10 NOTE — Therapy (Signed)
[facility] [address] [place], [name], [address] Phone: [phone]   Fax:  [phone]  Physical Therapy Treatment  Patient Details  Name: Jose Beck MRN: [id number]:[id number] Date of Birth: [birth year] Referring Provider: Dr. Alysia Penna  Encounter Date: 09/10/2015      PT End of Session - 09/10/15 0931    Visit Number 4   Number of Visits 16   Date for PT Re-Evaluation 10/16/15   PT Start Time 0846   PT Stop Time 0930   PT Time Calculation (min) 44 min   Activity Tolerance Patient tolerated treatment well      No past medical history on file.  No past surgical history on file.  There were no vitals filed for this visit.      Subjective Assessment - 09/10/15 0851    Subjective Reports that he went up and down stairs at church yesterday with Min Guard then at [employer].   Currently in Pain? Yes   Pain Score 4    Pain Location Knee   Pain Orientation Right   Pain Descriptors / Indicators Sore   Pain Type Chronic pain   Pain Onset More than a month ago   Pain Frequency Intermittent   Aggravating Factors  knee bends   Pain Relieving Factors rest                         [facility] Adult PT Treatment/Exercise - 09/10/15 0001    Ambulation/Gait   Ambulation/Gait Yes   Ambulation/Gait Assistance 4: Min guard   Ambulation/Gait Assistance Details Worked on sequence and Right foot clearance  stopped self-corrected, no LOB   Ambulation Distance (Feet) 230 Feet   Assistive device Straight cane   Gait Pattern Step-through pattern;Poor foot clearance - right;Decreased arm swing - right   Ambulation Surface Level   Stairs Yes   Stairs Assistance 4: Min guard   Stairs Assistance Details (indicate cue type and reason) 1 rail and cane.   Number of Stairs 4  x3             Balance Exercises - 09/10/15 0852    Balance Exercises: Standing   Standing Eyes Opened Wide (BOA);Solid  surface  Alternate kicks (forward, backwards and sideways), heel-to-raises working on balance with decreased support.   Other Standing Exercises Feet together: Head turns and nods, upper trunk rotation; attempting no UE support.  Supervsion, intermittent UE support.   OTAGO PROGRAM   Knee Bends 20 reps, no support           PT Education - 09/10/15 0921    Education provided Yes   Education Details Continued recommendation to use walker at home vs no device.   Person(s) Educated Patient;Spouse   Methods Explanation   Comprehension Verbalized understanding          PT Short Term Goals - 08/21/15 0941    PT SHORT TERM GOAL #1   Title verbalize understanding of CVA risk factors/warning signs to decrease risk of reinjury (09/18/15)   Time 4   Period Weeks   Status New   PT SHORT TERM GOAL #2   Title improve BERG balance score to >/= 38/56 for improved balance    PT SHORT TERM GOAL #3   Title improve timed up and go to < 30 sec with LRAD for improved mobility and function   PT SHORT TERM GOAL #4   Title ambulate > 250' with LRAD modified  independent on indoor/paved outdoor surfaces for improved function   PT SHORT TERM GOAL #5   Title negotiate > 8 steps with 1 handrail and supervision for improved function and ability to negotiate stairs at [place] - 08/21/15 0943    PT LONG TERM GOAL #1   Title independent with HEP (10/16/15)   Time 8   Period Weeks   Status New   PT LONG TERM GOAL #2   Title improve BERG balance score to >/= 45/56 for decreased fall risk   PT LONG TERM GOAL #3   Title improve gait velocity to > 2.62 ft/sec for improved function and mobility   PT LONG TERM GOAL #4   Title improve timed up and go with LRAD to < 25 sec for improved function and mobility   PT LONG TERM GOAL #5   Title ambulate > 500' on various indoor/outdoor surfaces with LRAD modified independent for improved function and mobility.                Plan - 09/10/15 0932    Clinical Impression Statement Progressing with balance and gait, but continues to demonstrate decrease standing balance with  narrow BOS on level surface requiring intermittent UE support.   Rehab Potential Good   PT Frequency 2x / week   PT Duration 8 weeks   PT Treatment/Interventions ADLs/Self Care Home Management;Electrical Stimulation;Patient/family education;Neuromuscular re-education;Balance training;Therapeutic exercise;Therapeutic activities;Functional mobility training;Stair training;Gait training;Vestibular   PT Next Visit Plan CVA ed; gait, continue with strengthening and balance activities working toward pt/spouse goal of stair negotiation for upcoming beach trip.   Consulted and Agree with Plan of Care Patient;Family member/caregiver   Family Member Consulted wife      Patient will benefit from skilled therapeutic intervention in order to improve the following deficits and impairments:  Abnormal gait, Decreased balance, Decreased coordination, Decreased mobility, Postural dysfunction, Decreased strength, Decreased activity tolerance  Visit Diagnosis: Other abnormalities of gait and mobility     Problem List Patient Active Problem List   Diagnosis Date Noted  . 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 Gait disturbance, 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  . CVA (cerebral infarction) 08/09/2015    Bjorn Loser, PTA  09/10/2015, 4:09 PM  [facility] [address] [place], [name], [address] Phone: [phone]   Fax:  [phone]  Name: Jose Beck MRN: [id number] Date of Birth: 08-06-1945

## 2015-09-10 NOTE — Therapy (Signed)
[facility] [address] [place], [name], [address] Phone: [phone]   Fax:  [phone]  Occupational Therapy Treatment  Patient Details  Name: Jose Beck MRN: [id number] Date of Birth: 03/15/1946 Referring Provider: Dr. Letta Pate  Encounter Date: 09/10/2015      OT End of Session - 09/10/15 1247    Visit Number 6   Number of Visits 16   Date for OT Re-Evaluation 10/16/15   Authorization Type medicare   Authorization Time Period 60 days, Pt will need G code and PN every 10th visit   Authorization - Visit Number 6   Authorization - Number of Visits 10   OT Start Time 0932   OT Stop Time 1015   OT Time Calculation (min) 43 min      History reviewed. No pertinent past medical history.  History reviewed. No pertinent past surgical history.  There were no vitals filed for this visit.      Subjective Assessment - 09/10/15 0939    Subjective  I think my eating is getting better with the new handles and practice but hard   Patient is accompained by: Family member  wife   Pertinent History see epic   Patient Stated Goals To get back to normal - my speech, my toothbrush, my R leg   Currently in Pain? No/denies                      OT Treatments/Exercises (OP) - 09/10/15 0001    ADLs   Cooking Utilized simple cooking task (scrambled eggs) to address functional mobility with walker, safety, problem solving, and functional use of RUE. Pt with apraxic use of RUE.Pt attempts to use RUE and is successful approximately 50-70% of the time and then will switch and use LUE (non dominant).  Pt required min vc's for walker safety and apraxia evident in orienting body to tasks as well.  Pt with overall good safety awareness and improving balancing.  Demonstrated use of rolling cart - pt could use with supervsion in small spaces.  Also discussed use of walker bag to move items when using walker.               Balance Exercises - 09/10/15 0852    Balance Exercises: Standing   Standing Eyes Opened Wide (BOA);Solid surface  Alternate kicks (forward, backwards and sideways), heel-to..   Other Standing Exercises Feet together: Head turns and nods, upper trunk rotation; attempting no UE support.  Supervsion, intermittent UE support.   OTAGO PROGRAM   Knee Bends 20 reps, no support             OT Short Term Goals - 09/10/15 1245    OT SHORT TERM GOAL #1   Title Pt and wife will be mod I with HEP - 09/18/2015   Status Achieved   OT SHORT TERM GOAL #2   Title Pt will be mod I with toilet transfers   Status Achieved   OT SHORT TERM GOAL #3   Title Pt will be mod I with shower transfers   Status On-going   OT SHORT TERM GOAL #4   Title Pt will be able to cut meat, AE prn   Status Achieved   OT SHORT TERM GOAL #5   Title Pt will improve functional reaching/coordination as shown by improving score on box and blocks test by at least 10 bilaterally.   Baseline R-19blocks, L-29 blocks   Status On-going  OT SHORT TERM GOAL #6   Title Pt will demonstrate improved awareness as evidenced by being able to identify 2 cognitive deficits he is working on   Status Achieved           OT Long Term Goals - 09/10/15 1246    [place] #1   Title Pt and wife will be mod I with upgraded HEP - 10/16/2015   Status On-going   OT LONG TERM GOAL #2   Title Pt will be mod I with simple hot familiar meal prep   Status On-going   OT LONG TERM GOAL #3   Title Pt will need no more than 2 vc's for moderate complex problem sovling within the context of a functional task.   Status On-going   OT LONG TERM GOAL #4   Title Pt will demonstrate improved safety awareness with functional mobility to reach mod I level   Status On-going   OT LONG TERM GOAL #5   Title Pt will demo improved coordination for ADLs as shown by completing 9-hole peg in 70sec or less with RUE.   Baseline 73min 55.16sec    Status On-going               Plan - 09/10/15 1246    Clinical Impression Statement Pt progressing toward goals. Pt with improving balance and slow improvement in functional use of RUE.   Rehab Potential Good   Clinical Impairments Affecting Rehab Potential decreaed awareness, cognition;  wife also appears to have poor understanding of pt's cogntive deficits.    OT Frequency 2x / week   OT Duration 8 weeks   OT Treatment/Interventions Self-care/ADL training;Therapeutic exercise;Neuromuscular education;DME and/or AE instruction;Manual Therapy;Therapist, nutritional;Therapeutic activities;Cognitive remediation/compensation;Patient/family education;Balance training;Moist Heat;Ultrasound   Plan balance retraining, incoroprate RUE into function, cognition   Consulted and Agree with Plan of Care Patient;Family member/caregiver   Family Member Consulted wife      Patient will benefit from skilled therapeutic intervention in order to improve the following deficits and impairments:  Abnormal gait, Cardiopulmonary status limiting activity, Decreased balance, Decreased cognition, Decreased coordination, Decreased safety awareness, Decreased mobility, Decreased strength, Difficulty walking, Impaired UE functional use, Impaired sensation  Visit Diagnosis: Other abnormalities of gait and mobility  Other disturbances of skin sensation  Abnormal posture  Hemiplegia, unspecified affecting right dominant side (HCC)  Frontal lobe and executive function deficit    Problem List Patient Active Problem List   Diagnosis Date Noted  . 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 Gait disturbance, 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  . CVA (cerebral infarction) 08/09/2015    Quay Burow, OTR/L 09/10/2015, 12:48 PM  [facility] [address] [place], [name], [address] Phone: [phone]   Fax:  [phone]  Name: Jose Beck MRN: [place]:[id number] Date of Birth: [birth year]

## 2015-09-10 NOTE — Therapy (Signed)
[facility] [address] [place], [name], [address] Phone: [phone]   Fax:  [phone]  Speech Language Pathology Treatment  Patient Details  Name: Jose Beck MRN: [id number] Date of Birth: 04/06/1946 Referring Provider: Dr, Lupe Carney  Encounter Date: 09/10/2015      End of Session - 09/10/15 1001    Visit Number 4   Number of Visits 17   Date for SLP Re-Evaluation 10/23/15   SLP Start Time 0805   SLP Stop Time  0846   SLP Time Calculation (min) 41 min   Activity Tolerance Patient tolerated treatment well      No past medical history on file.  No past surgical history on file.  There were no vitals filed for this visit.      Subjective Assessment - 09/10/15 0810    Subjective "It was OK" (re: homework)   Currently in Pain? No/denies               ADULT SLP TREATMENT - 09/10/15 0812    General Information   Behavior/Cognition Alert;Cooperative;Pleasant mood   Treatment Provided   Treatment provided Cognitive-Linquistic   Cognitive-Linquistic Treatment   Treatment focused on Dysarthria   Skilled Treatment Pt "warmed up" with 5-syllable words, with rare min A for reduced rate and occasional min cues for overarticulation. In semi-structured sentence tasks pt req'd rare min A for overarticulation. In conversation, pt needed min-mod cues occasionally for min-mod cues. Pt noted to roll eyes at comments from wife re: his speech or details on stimuli that he did not tell SLP.   Assessment / Recommendations / Plan   Plan Continue with current plan of care   Progression Toward Goals   Progression toward goals Progressing toward goals            SLP Short Term Goals - 09/10/15 1002    SLP SHORT TERM GOAL #1   Title Pt will perform dysarthria HEP with mod I   Time 3   Period Weeks   Status On-going   SLP SHORT TERM GOAL #2   Title Pt will utilize compensations for dysarthira  during structured tasks with rare min A   Time 3   Period Weeks   Status On-going   SLP SHORT TERM GOAL #3   Title Pt will demonstrate utterance length of 5-7 words during structured speech/descriptive tasks with rare min A   Time 3   Period Weeks   Status On-going          SLP Long Term Goals - 09/10/15 1003    SLP LONG TERM GOAL #1   Title Pt will be 95% intellgible during simple conversation of 10 minutes with rare min A   Time 7   Period Weeks   Status On-going   SLP LONG TERM GOAL #2   Title Pt will participate in mildly complex conversation over 12 minutes with less than 3 requests for clarification/repeair communication breakdown.    Time 7   Period Weeks   Status On-going   SLP LONG TERM GOAL #3   Title Pt will utilize compensations for short term memory to recall details, lists, messages with 85% accuracy and rare min A.   Time 7   Period Weeks   Status On-going          Plan - 09/10/15 1001    Clinical Impression Statement Pt required verbal cues/instructions for awareness of dysarthric errors and to employ compensations for dysarthria. Continue  skilled ST to maximize intellgibility, awareness and utterance length.   Speech Therapy Frequency 2x / week   Duration --  7 weeks   Treatment/Interventions Cognitive reorganization;Internal/external aids;Compensatory strategies;SLP instruction and feedback;Patient/family education;Functional tasks;Language facilitation   Potential to Achieve Goals Good   Potential Considerations Ability to learn/carryover information   Family Member Consulted spouse      Patient will benefit from skilled therapeutic intervention in order to improve the following deficits and impairments:   Dysarthria and anarthria  Cognitive communication deficit    Problem List Patient Active Problem List   Diagnosis Date Noted  . 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 Gait disturbance, 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  . CVA (cerebral infarction)  08/09/2015    [facility] ,[place], [place]  09/10/2015, 10:04 AM  [facility] [address] [place], [name], [address] Phone: [phone]   Fax:  [phone]   Name: Jose Beck MRN: [id number] Date of Birth: [birth year]

## 2015-09-13 ENCOUNTER — Ambulatory Visit: Payer: Medicare Other | Admitting: Speech Pathology

## 2015-09-13 ENCOUNTER — Ambulatory Visit: Payer: Medicare Other | Admitting: Occupational Therapy

## 2015-09-13 ENCOUNTER — Ambulatory Visit: Payer: Medicare Other | Admitting: Physical Therapy

## 2015-09-13 ENCOUNTER — Encounter: Payer: Self-pay | Admitting: Occupational Therapy

## 2015-09-13 DIAGNOSIS — R41844 Frontal lobe and executive function deficit: Secondary | ICD-10-CM | POA: Diagnosis not present

## 2015-09-13 DIAGNOSIS — R208 Other disturbances of skin sensation: Secondary | ICD-10-CM | POA: Diagnosis not present

## 2015-09-13 DIAGNOSIS — R2689 Other abnormalities of gait and mobility: Secondary | ICD-10-CM | POA: Diagnosis not present

## 2015-09-13 DIAGNOSIS — M6281 Muscle weakness (generalized): Secondary | ICD-10-CM

## 2015-09-13 DIAGNOSIS — R2681 Unsteadiness on feet: Secondary | ICD-10-CM

## 2015-09-13 DIAGNOSIS — G8191 Hemiplegia, unspecified affecting right dominant side: Secondary | ICD-10-CM | POA: Diagnosis not present

## 2015-09-13 DIAGNOSIS — R293 Abnormal posture: Secondary | ICD-10-CM

## 2015-09-13 DIAGNOSIS — R260 Ataxic gait: Secondary | ICD-10-CM

## 2015-09-13 DIAGNOSIS — R471 Dysarthria and anarthria: Secondary | ICD-10-CM

## 2015-09-13 NOTE — Therapy (Signed)
[facility] [address] [place], [name], [address] Phone: [phone]   Fax:  [phone]  Occupational Therapy Treatment  Patient Details  Name: Jose Beck MRN: [id number] Date of Birth: 12-22-1945 Referring Provider: Dr. Letta Pate  Encounter Date: 09/13/2015      OT End of Session - 09/13/15 1209    Visit Number 7   Number of Visits 16   Date for OT Re-Evaluation 10/16/15   Authorization Type medicare   Authorization Time Period 60 days, Pt will need G code and PN every 10th visit   Authorization - Visit Number 7   Authorization - Number of Visits 10   OT Start Time 1101   OT Stop Time 1144   OT Time Calculation (min) 43 min   Activity Tolerance Patient tolerated treatment well      History reviewed. No pertinent past medical history.  History reviewed. No pertinent past surgical history.  There were no vitals filed for this visit.      Subjective Assessment - 09/13/15 1111    Subjective  My legs gave out on me the other day and I ended up sitting on the floor,  I didn't get hurt   Patient is accompained by: Family member  wife   Pertinent History see epic   Patient Stated Goals To get back to normal - my speech, my toothbrush, my R leg   Currently in Pain? No/denies                      OT Treatments/Exercises (OP) - 09/13/15 0001    ADLs   Eating Pt improving with self feeding for folk and knife use. Practiced with spoon today.  Pt also instructed to start to orient utensils in hand vs using his other hand to place them.    Neurological Re-education Exercises   Other Exercises 1 Neuror re ed to address sit to stand, static standing balance and dynamic standing balance without UE support.  Also incoporated functional task for RUE with emphasis on hand orientation, motor planning and coordination.                   OT Short Term Goals - 09/13/15 1201    OT SHORT  TERM GOAL #1   Title Pt and wife will be mod I with HEP - 09/18/2015   Status Achieved   OT SHORT TERM GOAL #2   Title Pt will be mod I with toilet transfers   Status Achieved   OT SHORT TERM GOAL #3   Title Pt will be mod I with shower transfers   Status On-going   OT SHORT TERM GOAL #4   Title Pt will be able to cut meat, AE prn   Status Achieved   OT SHORT TERM GOAL #5   Title Pt will improve functional reaching/coordination as shown by improving score on box and blocks test by at least 10 bilaterally.   Baseline R-19blocks, L-29 blocks   Status On-going   OT SHORT TERM GOAL #6   Title Pt will demonstrate improved awareness as evidenced by being able to identify 2 cognitive deficits he is working on   Status Achieved           OT Long Term Goals - 09/13/15 1201    [place] #1   Title Pt and wife will be mod I with upgraded HEP - 10/16/2015   Status On-going   OT  LONG TERM GOAL #2   Title Pt will be mod I with simple hot familiar meal prep   Status On-going   OT LONG TERM GOAL #3   Title Pt will need no more than 2 vc's for moderate complex problem sovling within the context of a functional task.   Status On-going   OT LONG TERM GOAL #4   Title Pt will demonstrate improved safety awareness with functional mobility to reach mod I level   Status On-going   OT LONG TERM GOAL #5   Title Pt will demo improved coordination for ADLs as shown by completing 9-hole peg in 70sec or less with RUE.   Baseline 18min 55.16sec   Status On-going               Plan - 09/13/15 1201    Clinical Impression Statement Pt progressing toward goals. Pt with improved speech and RUE contro;/functional use.    Rehab Potential Good   Clinical Impairments Affecting Rehab Potential decreaed awareness, cognition;  wife also appears to have poor understanding of pt's cogntive deficits.    OT Frequency 2x / week   OT Duration 8 weeks   OT Treatment/Interventions Self-care/ADL  training;Therapeutic exercise;Neuromuscular education;DME and/or AE instruction;Manual Therapy;Therapist, nutritional;Therapeutic activities;Cognitive remediation/compensation;Patient/family education;Balance training;Moist Heat;Ultrasound   Plan balance retraining, NMR for trunk control, RUE, cognition   Consulted and Agree with Plan of Care Patient;Family member/caregiver   Family Member Consulted wife      Patient will benefit from skilled therapeutic intervention in order to improve the following deficits and impairments:  Abnormal gait, Cardiopulmonary status limiting activity, Decreased balance, Decreased cognition, Decreased coordination, Decreased safety awareness, Decreased mobility, Decreased strength, Difficulty walking, Impaired UE functional use, Impaired sensation  Visit Diagnosis: Abnormal posture  Hemiplegia, unspecified affecting right dominant side (HCC)  Unsteadiness on feet  Other abnormalities of gait and mobility  Frontal lobe and executive function deficit  Other disturbances of skin sensation    Problem List Patient Active Problem List   Diagnosis Date Noted  . 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 Gait disturbance, 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  . CVA (cerebral infarction) 08/09/2015    Quay Burow, OTR/L 09/13/2015, 12:10 PM  [facility] [address] [place], [name], [address] Phone: [phone]   Fax:  [phone]  Name: Jose Beck MRN: [id number] Date of Birth: 1946-02-08

## 2015-09-13 NOTE — Therapy (Signed)
[facility] [address] [place], [name], [address] Phone: [phone]   Fax:  [phone]  Speech Language Pathology Treatment  Patient Details  Name: Jose Beck MRN: [id number] Date of Birth: June 28, 1945 Referring Provider: Dr, Lupe Carney  Encounter Date: 09/13/2015      End of Session - 09/13/15 0934    Visit Number 5   Date for SLP Re-Evaluation 10/23/15   SLP Start Time 0847   SLP Stop Time  0932   SLP Time Calculation (min) 45 min      No past medical history on file.  No past surgical history on file.  There were no vitals filed for this visit.      Subjective Assessment - 09/13/15 0851    Subjective "I've been doing my homework"   Patient is accompained by: Family member   Currently in Pain? No/denies               ADULT SLP TREATMENT - 09/13/15 0852    General Information   Behavior/Cognition Alert;Cooperative;Pleasant mood   Treatment Provided   Treatment provided Cognitive-Linquistic   Pain Assessment   Pain Assessment No/denies pain   Cognitive-Linquistic Treatment   Treatment focused on Dysarthria   Skilled Treatment Pt performed HEP with rare min A using slow rate and overarticulation. Facilitated compensation for dysarthria generating sentences with  multisyllabic words with occasional min A for over articulation. Pt ID'd errors in speech with rare min A - improving awareness. Simple conversation using pictures for stimuli with occasional min for compensations - dysarthria more pronounced during conversation.             SLP Short Term Goals - 09/13/15 0933    SLP SHORT TERM GOAL #1   Title Pt will perform dysarthria HEP with mod I   Time 3   Period Weeks   Status On-going   SLP SHORT TERM GOAL #2   Title Pt will utilize compensations for dysarthira during structured tasks with rare min A   Time 3   Period Weeks   Status On-going   SLP SHORT TERM GOAL #3    Title Pt will demonstrate utterance length of 5-7 words during structured speech/descriptive tasks with rare min A   Time 3   Period Weeks   Status On-going          SLP Long Term Goals - 09/13/15 0934    SLP LONG TERM GOAL #1   Title Pt will be 95% intellgible during simple conversation of 10 minutes with rare min A   Time 7   Period Weeks   Status On-going   SLP LONG TERM GOAL #2   Title Pt will participate in mildly complex conversation over 12 minutes with less than 3 requests for clarification/repeair communication breakdown.    Time 7   Period Weeks   Status On-going   SLP LONG TERM GOAL #3   Title Pt will utilize compensations for short term memory to recall details, lists, messages with 85% accuracy and rare min A.   Time 7   Period Weeks   Status On-going          Plan - 09/13/15 0933    Clinical Impression Statement Pt required verbal cues/instructions for awareness of dysarthric errors and to employ compensations for dysarthria. Continue skilled ST to maximize intellgibility, awareness and utterance length.   Speech Therapy Frequency 2x / week   Treatment/Interventions Cognitive reorganization;Internal/external aids;Compensatory strategies;SLP instruction and feedback;Patient/family education;Functional  tasks;Language facilitation   Potential to Achieve Goals Good   Potential Considerations Ability to learn/carryover information   Consulted and Agree with Plan of Care Patient;Family member/caregiver   Family Member Consulted spouse      Patient will benefit from skilled therapeutic intervention in order to improve the following deficits and impairments:   Dysarthria and anarthria    Problem List Patient Active Problem List   Diagnosis Date Noted  . 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 Gait disturbance, 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  . CVA (cerebral infarction) 08/09/2015    [place] Pellicane, Annye Rusk MS, CCC-SLP 09/13/2015, 9:35 AM  [facility] [address] [place], [name], [address] Phone: [phone]   Fax:  [phone]   Name: Jose Beck MRN: [place]:[id number] Date of Birth: 1945/09/25

## 2015-09-13 NOTE — Therapy (Signed)
[facility] [address] [place], [name], [address] Phone: [phone]   Fax:  [phone]  Physical Therapy Treatment  Patient Details  Name: Jose Beck MRN: [id number] Date of Birth: 08/29/1945 Referring Provider: Dr. Alysia Penna  Encounter Date: 09/13/2015      PT End of Session - 09/13/15 1126    Visit Number 5   Number of Visits 16   Date for PT Re-Evaluation 10/16/15   PT Start Time 0931   PT Stop Time 1015   PT Time Calculation (min) 44 min   Equipment Utilized During Treatment Gait belt   Activity Tolerance Patient tolerated treatment well   Behavior During Therapy [facility] for tasks assessed/performed      No past medical history on file.  No past surgical history on file.  There were no vitals filed for this visit.      Subjective Assessment - 09/13/15 0937    Subjective has been doing some walking with the cane; working on stairs.  wants to sit in a beach chair at the beach but wife concerned about him getting up.   Patient Stated Goals improve function and mobility; wants to go to beach in May with many stairs to navigate   Currently in Pain? No/denies          Gait Training: Amb outdoors on sand and grass with RW 100' with min A and min cues for technique; cues needed to slow down. Amb 220' with SPC and min A; pt demonstrated correct technique without cues. Increase R knee instability near end of distance and min A needed for RUE arm swing.  Negotiated stairs with 1 rail (on L ascending) and SPC with supervision.  Mod cues for sequencing and attention to RUE on rail when descending.  Educated on how to safely turn on stairs to practice 2-3 stairs at home and pt/wife verbalized/returned demonstration of understanding.  SciFit L3 x 5 min; 4 extremities                       PT Education - 09/13/15 1122    Education provided Yes   Education Details gait  training; stair training   Person(s) Educated Patient;Spouse   Methods Explanation   Comprehension Verbalized understanding          PT Short Term Goals - 08/21/15 0941    PT SHORT TERM GOAL #1   Title verbalize understanding of CVA risk factors/warning signs to decrease risk of reinjury (09/18/15)   Time 4   Period Weeks   Status New   PT SHORT TERM GOAL #2   Title improve BERG balance score to >/= 38/56 for improved balance    PT SHORT TERM GOAL #3   Title improve timed up and go to < 30 sec with LRAD for improved mobility and function   PT SHORT TERM GOAL #4   Title ambulate > 250' with LRAD modified independent on indoor/paved outdoor surfaces for improved function   PT SHORT TERM GOAL #5   Title negotiate > 8 steps with 1 handrail and supervision for improved function and ability to negotiate stairs at [employer]           PT [place] - 08/21/15 0943    PT LONG TERM GOAL #1   Title independent with HEP (10/16/15)   Time 8   Period Weeks   Status New   PT LONG TERM GOAL #2  Title improve BERG balance score to >/= 45/56 for decreased fall risk   PT LONG TERM GOAL #3   Title improve gait velocity to > 2.62 ft/sec for improved function and mobility   PT LONG TERM GOAL #4   Title improve timed up and go with LRAD to < 25 sec for improved function and mobility   PT LONG TERM GOAL #5   Title ambulate > 500' on various indoor/outdoor surfaces with LRAD modified independent for improved function and mobility.               Plan - 09/13/15 1128    Clinical Impression Statement Session focused on gait training with [facility] and simulated beach activities.  Pt wants to push beach w/c rather than ride in sand to spot on beach.  At this time pt min A with RW on grass/simulated beach surfaces but will continue to practice until pt goes to beach next week.  Pt also with stairs and is supervision with min cues for sequencing and attention to RUE.   PT Next Visit Plan CVA  ed; gait, continue with strengthening and balance activities working toward pt/spouse goal of stair negotiation for upcoming beach trip; practice getting up/down from low beach chair if available   Consulted and Agree with Plan of Care Patient;Family member/caregiver   Family Member Consulted wife      Patient will benefit from skilled therapeutic intervention in order to improve the following deficits and impairments:     Visit Diagnosis: Muscle weakness (generalized)  Other disturbances of skin sensation  Abnormal posture  Unsteadiness on feet  Ataxic gait     Problem List Patient Active Problem List   Diagnosis Date Noted  . 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 Gait disturbance, 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  . CVA (cerebral infarction) 08/09/2015   [name], PT, DPT 09/13/2015 11:40 AM  [facility] [address] [place], [name], [address] Phone: [phone]   Fax:  [phone]  Name: Jose Beck MRN: [id number] Date of Birth: 13-Jan-1946

## 2015-09-13 NOTE — Patient Instructions (Signed)
  Upholstery  Detergent  [place]  Surmize  Obviously  Physicist, medical  Under the manifold

## 2015-09-15 DIAGNOSIS — E1151 Type 2 diabetes mellitus with diabetic peripheral angiopathy without gangrene: Secondary | ICD-10-CM | POA: Diagnosis not present

## 2015-09-15 DIAGNOSIS — I10 Essential (primary) hypertension: Secondary | ICD-10-CM | POA: Diagnosis not present

## 2015-09-15 DIAGNOSIS — Z6838 Body mass index (BMI) 38.0-38.9, adult: Secondary | ICD-10-CM | POA: Diagnosis not present

## 2015-09-15 DIAGNOSIS — I639 Cerebral infarction, unspecified: Secondary | ICD-10-CM | POA: Diagnosis not present

## 2015-09-15 DIAGNOSIS — E784 Other hyperlipidemia: Secondary | ICD-10-CM | POA: Diagnosis not present

## 2015-09-17 ENCOUNTER — Ambulatory Visit: Payer: Medicare Other | Admitting: Occupational Therapy

## 2015-09-17 ENCOUNTER — Ambulatory Visit: Payer: Medicare Other | Admitting: Physical Therapy

## 2015-09-17 ENCOUNTER — Ambulatory Visit: Payer: Medicare Other

## 2015-09-17 ENCOUNTER — Encounter: Payer: Self-pay | Admitting: Occupational Therapy

## 2015-09-17 ENCOUNTER — Ambulatory Visit (INDEPENDENT_AMBULATORY_CARE_PROVIDER_SITE_OTHER): Payer: Medicare Other | Admitting: Neurology

## 2015-09-17 ENCOUNTER — Encounter: Payer: Self-pay | Admitting: Neurology

## 2015-09-17 VITALS — BP 149/88 | HR 99 | Ht 70.0 in | Wt 263.2 lb

## 2015-09-17 DIAGNOSIS — R2689 Other abnormalities of gait and mobility: Secondary | ICD-10-CM

## 2015-09-17 DIAGNOSIS — R293 Abnormal posture: Secondary | ICD-10-CM | POA: Diagnosis not present

## 2015-09-17 DIAGNOSIS — M6281 Muscle weakness (generalized): Secondary | ICD-10-CM

## 2015-09-17 DIAGNOSIS — R2681 Unsteadiness on feet: Secondary | ICD-10-CM

## 2015-09-17 DIAGNOSIS — R260 Ataxic gait: Secondary | ICD-10-CM

## 2015-09-17 DIAGNOSIS — R41844 Frontal lobe and executive function deficit: Secondary | ICD-10-CM

## 2015-09-17 DIAGNOSIS — I63219 Cerebral infarction due to unspecified occlusion or stenosis of unspecified vertebral arteries: Secondary | ICD-10-CM | POA: Diagnosis not present

## 2015-09-17 DIAGNOSIS — G8191 Hemiplegia, unspecified affecting right dominant side: Secondary | ICD-10-CM

## 2015-09-17 DIAGNOSIS — E669 Obesity, unspecified: Secondary | ICD-10-CM | POA: Diagnosis not present

## 2015-09-17 DIAGNOSIS — R208 Other disturbances of skin sensation: Secondary | ICD-10-CM | POA: Diagnosis not present

## 2015-09-17 DIAGNOSIS — I638 Other cerebral infarction: Secondary | ICD-10-CM

## 2015-09-17 DIAGNOSIS — R471 Dysarthria and anarthria: Secondary | ICD-10-CM | POA: Diagnosis not present

## 2015-09-17 DIAGNOSIS — I6389 Other cerebral infarction: Secondary | ICD-10-CM

## 2015-09-17 NOTE — Progress Notes (Signed)
[facility] [address] [place]. [name] [address] [phone]       OFFICE CONSULT NOTE  Jose. Jose Beck Date of Birth:  [birth year] Medical Record Number:  [id number]   Referring MD: Faustino Congress, PT  Reason for Referral:  stroke HPI: Jose Beck is a 16 year Caucasian male who is accompanied today by his wife. History is provided by the patient, wife as well as review of available medical records from [facility] in [place]. Patient was visiting [place] in [place] on 08/06/15 while at the visit presented as washing his hands noticed that all of a sudden the sound of the water was too loud. He also noticed that he was dizzy, off balance and had some slurred speech. EMS was called and patient was taken to the hospital. Patient was in the hospital for a few days. I do not have available detailed discharge summary and stroke workup details.. The only records provided to me included a report of a CT scan of the head on that day which showed no acute abnormality and remote age lacunar infarcts. 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 The mild atheromatous changes at carotid bifurcations as well as in the intracranial vessels. Patient was started on aspirin and Plavix which is tolerating well his had some bruising episodes particularly when he fell once. He is currently in outpatient physical and occupational therapy. He is been able to walk with a walker. He states his hearing and speech have improved though occasionally when he is tired he slows on few words. He still has imbalance but is careful and as long as he uses a walker is safe. He has no known prior history of strokes or TIAs. He does have multiple vascular risk factors in the form of diabetes, hypertension, hyperlipidemia, heart disease and obesity. Is not been diagnosed with sleep  apnea but does he does snore and does have daytime sleepiness and tiredness. The patient's wife did inform me that she has more detailed hospital records and she will drop them off for my review later.  ROS:   14 system review of systems is positive for memory loss, slurred speech, dizziness, gait imbalance and all other systems negative  PMH:  Past Medical History  Diagnosis Date  . Stroke ([place])   . Hypertension   . Diabetes mellitus without complication ([place])   . Hyperlipemia   . Heart disease     Social History:  Social History   Social History  . Marital Status: Married    Spouse Name: N/A  . Number of Children: N/A  . Years of Education: N/A   Occupational History  . Not on file.   Social History Main Topics  . Smoking status: Never Smoker   . Smokeless tobacco: Not on file  . Alcohol Use: 0.6 oz/week    0 Standard drinks or equivalent, 1 Shots of liquor per week     Comment: cocktails every day  . Drug Use: Not on file  . Sexual Activity: Not on file   Other Topics Concern  . Not on file   Social History Narrative    Medications:   Current Outpatient Prescriptions on File Prior to Visit  Medication Sig Dispense Refill  . aspirin 81 MG chewable tablet Chew 1 tablet (81 mg total) by mouth daily.    Marland Kitchen atorvastatin (LIPITOR) 40 MG tablet Take 1  tablet (40 mg total) by mouth daily at 6 PM. 30 tablet 1  . clopidogrel (PLAVIX) 75 MG tablet Take 1 tablet (75 mg total) by mouth daily. 30 tablet 1  . diltiazem (CARDIZEM CD) 360 MG 24 hr capsule Take 1 capsule (360 mg total) by mouth daily. 30 capsule 1  . insulin detemir (LEVEMIR) 100 UNIT/ML injection Inject 0.22 mLs (22 Units total) into the skin 2 (two) times daily. 10 mL 11  . terbinafine (LAMISIL) 250 MG tablet Take 250 mg by mouth daily. Reported on 08/21/2015  0   No current facility-administered medications on file prior to visit.    Allergies:   Allergies  Allergen Reactions  . Cheese Nausea And Vomiting    . Pravastatin     Anxiety attack    Physical Exam General: Obese middle-age Caucasian male seated, in no evident distress Head: head normocephalic and atraumatic.   Neck: supple with no carotid or supraclavicular bruits Cardiovascular: regular rate and rhythm, no murmurs Musculoskeletal: no deformity Skin:  no rash/petichiae Vascular:  Normal pulses all extremities  Neurologic Exam Mental Status: Awake and fully alert. Oriented to place and time. Recent and remote memory intact. Attention span, concentration and fund of knowledge appropriate. Mood and affect appropriate.  Cranial Nerves: Fundoscopic exam reveals sharp disc margins. Pupils equal, briskly reactive to light. Extraocular movements full without nystagmus. Visual fields full to confrontation. Hearing intact. Facial sensation intact. Face, tongue, palate moves normally and symmetrically.  Motor: Normal bulk and tone. Normal strength in all tested extremity muscles. Sensory.: intact to touch , pinprick , position and vibratory sensation.  Coordination: Rapid alternating movements normal in all extremities. Finger-to-nose and heel-to-shin performed accurately bilaterally. Gait and Station: Arises from chair without difficulty. Stance is broad based. Gait demonstrates normal stride length and balance . Unable to heel, toe and tandem walk without difficulty.  Reflexes: 1+ and symmetric. Toes downgoing.   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 Vascular risk factors of hypertension, hyperlipidemia, obesity and intracranial atherosclerosis. Suspected sleep apnea.    PLAN: I had a long d/w patient and his wifeabout his recent stroke, risk for recurrent stroke/TIAs, personally independently reviewed imaging studies and stroke  evaluation results and answered questions.Continue aspirin 81 mg daily and clopidogrel 75 mg daily  for secondary stroke prevention for 6 more weeks. 11/06/15 and then discontinue aspirin and stay on Plavix alone and maintain strict control of hypertension with blood pressure goal below 130/90, diabetes with hemoglobin A1c goal below 6.5% and lipids with LDL cholesterol goal below 70 mg/dL. I also advised the patient to eat a healthy diet with plenty of whole grains, cereals, fruits and vegetables, exercise regularly and maintain ideal body weight .he was advised to use a cane and a walker at all times to avoid falls. Check polysomnogram for sleep apnea. I have advised the patient's wife to drop off the more detailed records from his hospitalization in [place] for his stroke for me to review. Greater than 50% time during this 45 minute consultation was spent on counseling and coordination of care about his stroke and stroke prevention Followup in the future with me in 3 months or call earlier if necessary  Antony Contras, [facility] [address] [place], [place] [address]  Phone [phone] Fax [phone]  Note: This document was prepared with digital dictation and possible smart phrase technology. Any transcriptional errors that result from this process are unintentional.

## 2015-09-17 NOTE — Therapy (Signed)
[facility] [address] [place], [name], [address] Phone: [phone]   Fax:  [phone]  Occupational Therapy Treatment  Patient Details  Name: Jose Beck Beck MRN: [id number] Date of Birth: 1946-04-17 Referring Provider: Dr. Letta Pate  Encounter Date: 09/17/2015      OT End of Session - 09/17/15 1504    Visit Number 8   Number of Visits 16   Date for OT Re-Evaluation 10/16/15   Authorization Type medicare   Authorization Time Period 60 days, Pt will need G code and PN every 10th visit   Authorization - Visit Number 8   Authorization - Number of Visits 10   OT Start Time 1101   OT Stop Time 1144   OT Time Calculation (min) 43 min   Activity Tolerance Patient tolerated treatment well      History reviewed. No pertinent past medical history.  History reviewed. No pertinent past surgical history.  There were no vitals filed for this visit.      Subjective Assessment - 09/17/15 1106    Subjective  I walked alot this weekend and I didn't have any problems.    Patient is accompained by: Family member  wife   Pertinent History see epic   Patient Stated Goals To get back to normal - my speech, my toothbrush, my Jose leg   Currently in Pain? No/denies                      OT Treatments/Exercises (OP) - 09/17/15 0001    ADLs   ADL Comments Checked STG's including box and blocks - see goals for details.    Neurological Re-education Exercises   Other Exercises 1 Neuro re ed to address static and dynamic standing balance with alternating lateral weight shift as well as A/P weight shift in contex of a functional activity; functional ambulation with straight cane, activity tolerance.                    OT Short Term Goals - 09/17/15 1502    OT SHORT TERM GOAL #1   Title Pt and wife will be mod I with HEP - 09/18/2015   Status Achieved   OT SHORT TERM GOAL #2   Title Pt will be mod I  with toilet transfers   Status Achieved   OT SHORT TERM GOAL #3   Title Pt will be mod I with shower transfers   Status On-going  wife still providing distant supevision   OT SHORT TERM GOAL #4   Title Pt will be able to cut meat, AE prn   Status Achieved   OT SHORT TERM GOAL #5   Title Pt will improve functional reaching/coordination as shown by improving score on box and blocks test by at least 10 bilaterally.   Baseline Jose-19blocks, L-29 blocks   Status Achieved  Jose=25, L = 39   OT SHORT TERM GOAL #6   Title Pt will demonstrate improved awareness as evidenced by being able to identify 2 cognitive deficits he is working on   Status Achieved           OT Long Term Goals - 09/17/15 1502    [place] #1   Title Pt and wife will be mod I with upgraded HEP - 10/16/2015   Status On-going   OT LONG TERM GOAL #2   Title Pt will be mod I with simple hot familiar meal prep  Status On-going   OT LONG TERM GOAL #3   Title Pt will need no more than 2 vc's for moderate complex problem sovling within the context of a functional task.   Status On-going   OT LONG TERM GOAL #4   Title Pt will demonstrate improved safety awareness with functional mobility to reach mod I level   Status On-going   OT LONG TERM GOAL #5   Title Pt will demo improved coordination for ADLs as shown by completing 9-hole peg in 70sec or less with RUE.   Baseline 43mn 55.16sec   Status On-going               Plan - 09/17/15 1503    Clinical Impression Statement Pt has met all but 1 STG and is progressing toward LTG's.  Pt with improving stability and balance.    Rehab Potential Good   Clinical Impairments Affecting Rehab Potential decreaed awareness, cognition;  wife also appears to have poor understanding of pt's cogntive deficits.    OT Frequency 2x / week   OT Duration 8 weeks   OT Treatment/Interventions Self-care/ADL training;Therapeutic exercise;Neuromuscular education;DME and/or AE  instruction;Manual Therapy;FTherapist, nutritionalTherapeutic activities;Cognitive remediation/compensation;Patient/family education;Balance training;Moist Heat;Ultrasound   Plan balance retraining, NMR for trunk control, RUE, cogntion   Consulted and Agree with Plan of Care Patient;Family member/caregiver   Family Member Consulted wife      Patient will benefit from skilled therapeutic intervention in order to improve the following deficits and impairments:  Abnormal gait, Cardiopulmonary status limiting activity, Decreased balance, Decreased cognition, Decreased coordination, Decreased safety awareness, Decreased mobility, Decreased strength, Difficulty walking, Impaired UE functional use, Impaired sensation  Visit Diagnosis: Hemiplegia, unspecified affecting right dominant side (HCC)  Muscle weakness (generalized)  Abnormal posture  Unsteadiness on feet  Frontal lobe and executive function deficit  Other abnormalities of gait and mobility    Problem List Patient Active Problem List   Diagnosis Date Noted  . 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 Gait disturbance, 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  . CVA (cerebral infarction) 08/09/2015    Jose Beck Beck OTR/L 09/17/2015, 3:05 PM  [place] [place] [address]Phone: [phone]  Fax:  [phone] Name: Jose Beck FronczakMRN: [id number] of Birth: [birth year]

## 2015-09-17 NOTE — Patient Instructions (Signed)
  Keep the focus on slowing down and over-pronouncing the words when you talk  Supreme!!

## 2015-09-17 NOTE — Therapy (Signed)
[facility] [address] [place] [address] [place], [name], [address] Phone: [phone]   Fax:  [phone]  Physical Therapy Treatment  Patient Details  Name: Jose Beck MRN: [id number] Date of Birth: 10-28-1945 Referring Provider: Dr. Alysia Penna  Encounter Date: 09/17/2015      PT End of Session - 09/17/15 1421    Visit Number 6   Number of Visits 16   Date for PT Re-Evaluation 10/16/15   PT Start Time 1230   PT Stop Time 1311   PT Time Calculation (min) 41 min   Equipment Utilized During Treatment Gait belt   Activity Tolerance Patient tolerated treatment well   Behavior During Therapy [facility] for tasks assessed/performed      No past medical history on file.  No past surgical history on file.  There were no vitals filed for this visit.      Subjective Assessment - 09/17/15 1233    Subjective walked with cane on porch this weekend; praciticed stairs   Patient Stated Goals improve function and mobility; wants to go to beach in May with many stairs to navigate   Currently in Pain? No/denies        Gait Training: Negotiated stairs with SPC and 1 handrail with supervision; min cues for correct sequence.  Gait with w/c (and 25# in seat) on compliant surface for simulated push on sand with minguard A.  Min cues for technique and negotiating on compliant surface.  Cues to keep w/c close.    Neuro Re-ed: Forward/Lateral step ups with RLE leading x 10 each direction with 1 UE support on 4" step.  Forward step downs from 4" step x 10 with 1 UE support.  RLE taps to targets with 1 and 0 UE support with 5# and 3# ankle cuff.  LLE taps to targets without UE support for SLS.  SLS activities and coordination activities with min A: single tap to cones with 1 and 0 UE support; min cues for technique.                            PT Short Term Goals - 08/21/15 0941    PT SHORT TERM GOAL #1   Title  verbalize understanding of CVA risk factors/warning signs to decrease risk of reinjury (09/18/15)   Time 4   Period Weeks   Status New   PT SHORT TERM GOAL #2   Title improve BERG balance score to >/= 38/56 for improved balance    PT SHORT TERM GOAL #3   Title improve timed up and go to < 30 sec with LRAD for improved mobility and function   PT SHORT TERM GOAL #4   Title ambulate > 250' with LRAD modified independent on indoor/paved outdoor surfaces for improved function   PT SHORT TERM GOAL #5   Title negotiate > 8 steps with 1 handrail and supervision for improved function and ability to negotiate stairs at [employer]           PT Long Term Goals - 08/21/15 0943    PT LONG TERM GOAL #1   Title independent with HEP (10/16/15)   Time 8   Period Weeks   Status New   PT LONG TERM GOAL #2   Title improve BERG balance score to >/= 45/56 for decreased fall risk   PT LONG TERM GOAL #3   Title improve gait velocity to > 2.62 ft/sec for improved  function and mobility   PT LONG TERM GOAL #4   Title improve timed up and go with LRAD to < 25 sec for improved function and mobility   PT LONG TERM GOAL #5   Title ambulate > 500' on various indoor/outdoor surfaces with LRAD modified independent for improved function and mobility.               Plan - 09/17/15 1422    Clinical Impression Statement Pt tolerated exercises well today needing min A for more challenging balance activities.  Pt/wife concerned about pushing RW in sand; trialed w/c on simulated sand surfaces with minguard A.  Recommend when pt goes to beach to push w/c rather than RW.   PT Next Visit Plan CVA ed; gait, continue with strengthening and balance activities working toward pt/spouse goal of stair negotiation for upcoming beach trip; practice getting up/down from low beach chair if available   Consulted and Agree with Plan of Care Patient;Family member/caregiver   Family Member Consulted wife      Patient will  benefit from skilled therapeutic intervention in order to improve the following deficits and impairments:     Visit Diagnosis: Abnormal posture  Unsteadiness on feet  Ataxic gait  Muscle weakness (generalized)  Other abnormalities of gait and mobility  Hemiplegia, unspecified affecting right dominant side [facility])     Problem List Patient Active Problem List   Diagnosis Date Noted  . 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 Gait disturbance, 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  . CVA (cerebral infarction) 08/09/2015   [name], PT, DPT 09/17/2015 2:25 PM  [facility] [address] [place], [name], [address] Phone: [phone]   Fax:  [phone]  Name: Jose Beck MRN: [id number] Date of Birth: 02-12-1946

## 2015-09-17 NOTE — Patient Instructions (Signed)
I had a long d/w patient and his wifeabout his recent stroke, risk for recurrent stroke/TIAs, personally independently reviewed imaging studies and stroke evaluation results and answered questions.Continue aspirin 81 mg daily and clopidogrel 75 mg daily  for secondary stroke prevention for 6 more weeks. 11/06/15 and then discontinue aspirin and stay on Plavix alone and maintain strict control of hypertension with blood pressure goal below 130/90, diabetes with hemoglobin A1c goal below 6.5% and lipids with LDL cholesterol goal below 70 mg/dL. I also advised the patient to eat a healthy diet with plenty of whole grains, cereals, fruits and vegetables, exercise regularly and maintain ideal body weight .he was advised to use a cane and a walker at all times to avoid falls. Check polysomnogram for sleep apnea. Followup in the future with me in 3 months or call earlier if necessary  Stroke Prevention Some medical conditions and behaviors are associated with an increased chance of having a stroke. You may prevent a stroke by making healthy choices and managing medical conditions. HOW CAN I REDUCE MY RISK OF HAVING A STROKE?   Stay physically active. Get at least 30 minutes of activity on most or all days.  Do not smoke. It may also be helpful to avoid exposure to secondhand smoke.  Limit alcohol use. Moderate alcohol use is considered to be:  No more than 2 drinks per day for men.  No more than 1 drink per day for nonpregnant women.  Eat healthy foods. This involves:  Eating 5 or more servings of fruits and vegetables a day.  Making dietary changes that address high blood pressure (hypertension), high cholesterol, diabetes, or obesity.  Manage your cholesterol levels.  Making food choices that are high in fiber and low in saturated fat, trans fat, and cholesterol may control cholesterol levels.  Take any prescribed medicines to control cholesterol as directed by your health care  provider.  Manage your diabetes.  Controlling your carbohydrate and sugar intake is recommended to manage diabetes.  Take any prescribed medicines to control diabetes as directed by your health care provider.  Control your hypertension.  Making food choices that are low in salt (sodium), saturated fat, trans fat, and cholesterol is recommended to manage hypertension.  Ask your health care provider if you need treatment to lower your blood pressure. Take any prescribed medicines to control hypertension as directed by your health care provider.  If you are 54-68 years of age, have your blood pressure checked every 3-5 years. If you are 33 years of age or older, have your blood pressure checked every year.  Maintain a healthy weight.  Reducing calorie intake and making food choices that are low in sodium, saturated fat, trans fat, and cholesterol are recommended to manage weight.  Stop drug abuse.  Avoid taking birth control pills.  Talk to your health care provider about the risks of taking birth control pills if you are over 20 years old, smoke, get migraines, or have ever had a blood clot.  Get evaluated for sleep disorders (sleep apnea).  Talk to your health care provider about getting a sleep evaluation if you snore a lot or have excessive sleepiness.  Take medicines only as directed by your health care provider.  For some people, aspirin or blood thinners (anticoagulants) are helpful in reducing the risk of forming abnormal blood clots that can lead to stroke. If you have the irregular heart rhythm of atrial fibrillation, you should be on a blood thinner unless there is a  good reason you cannot take them.  Understand all your medicine instructions.  Make sure that other conditions (such as anemia or atherosclerosis) are addressed. SEEK IMMEDIATE MEDICAL CARE IF:   You have sudden weakness or numbness of the face, arm, or leg, especially on one side of the body.  Your face  or eyelid droops to one side.  You have sudden confusion.  You have trouble speaking (aphasia) or understanding.  You have sudden trouble seeing in one or both eyes.  You have sudden trouble walking.  You have dizziness.  You have a loss of balance or coordination.  You have a sudden, severe headache with no known cause.  You have new chest pain or an irregular heartbeat. Any of these symptoms may represent a serious problem that is an emergency. Do not wait to see if the symptoms will go away. Get medical help at once. Call your local emergency services (911 in [place] Do not drive yourself to the hospital.   This information is not intended to replace advice given to you by your health care provider. Make sure you discuss any questions you have with your health care provider.   Document Released: 06/05/2004 Document Revised: 05/19/2014 Document Reviewed: 10/29/2012 Elsevier Interactive Patient Education [employer].

## 2015-09-17 NOTE — Therapy (Signed)
[facility] [address] [place], [name], [address] Phone: [phone]   Fax:  [phone]  Speech Language Pathology Treatment  Patient Details  Name: Jose Beck MRN: [id number] Date of Birth: [birth year] Referring Provider: Dr, Lupe Carney  Encounter Date: 09/17/2015      End of Session - 09/17/15 1225    Visit Number 6   Number of Visits 17   Date for SLP Re-Evaluation 10/23/15   SLP Start Time 1147   SLP Stop Time  1230   SLP Time Calculation (min) 43 min   Activity Tolerance Patient tolerated treatment well      No past medical history on file.  No past surgical history on file.  There were no vitals filed for this visit.      Subjective Assessment - 09/17/15 1148    Subjective "I haven't practiced since Thursday."   Currently in Pain? No/denies               ADULT SLP TREATMENT - 09/17/15 1152    General Information   Behavior/Cognition Alert;Cooperative;Pleasant mood   Treatment Provided   Treatment provided Cognitive-Linquistic   Cognitive-Linquistic Treatment   Treatment focused on Dysarthria   Skilled Treatment Pt generated sentences with multisyllabic words (he is, she is, they are) with occasional min A from SLP for compensatory strategies. In sentnece tasks pt req'd SLP cues for reduced rate and overarticulation occasionally. Self correction completed  but <10% of the time. Pt rated his percentage of thinking about compensations at 50%.    Assessment / Recommendations / Plan   Plan Continue with current plan of care   Progression Toward Goals   Progression toward goals Progressing toward goals          SLP Education - 09/17/15 1224    Education provided Yes   Education Details Compensations for speech   Person(s) Educated Patient;Spouse   Methods Explanation;Demonstration;Verbal cues   Comprehension Verbalized understanding;Returned demonstration;Need further  instruction;Verbal cues required          SLP Short Term Goals - 09/17/15 1227    SLP SHORT TERM GOAL #1   Title Pt will perform dysarthria HEP with mod I   Time 2   Period Weeks   Status On-going   SLP SHORT TERM GOAL #2   Title Pt will utilize compensations for dysarthira during structured tasks with rare min A   Time 2   Period Weeks   Status On-going   SLP SHORT TERM GOAL #3   Title Pt will demonstrate utterance length of 5-7 words during structured speech/descriptive tasks with rare min A   Time 2   Period Weeks   Status On-going          SLP Long Term Goals - 09/17/15 1227    SLP LONG TERM GOAL #1   Title Pt will be 95% intellgible during simple conversation of 10 minutes with rare min A   Time 6   Period Weeks   Status On-going   SLP LONG TERM GOAL #2   Title Pt will participate in mildly complex conversation over 12 minutes with less than 3 requests for clarification/repeair communication breakdown.    Time 6   Period Weeks   Status On-going   SLP LONG TERM GOAL #3   Title Pt will utilize compensations for short term memory to recall details, lists, messages with 85% accuracy and rare min A.   Time 6   Period Weeks  Status On-going          Plan - 09/17/15 1225    Clinical Impression Statement Pt required verbal cues/instructions for awareness of dysarthric errors and to employ compensations for dysarthria. Continue skilled ST to maximize intellgibility, awareness and utterance length.   Speech Therapy Frequency 2x / week   Duration --  6 weeks   Treatment/Interventions Cognitive reorganization;Internal/external aids;Compensatory strategies;SLP instruction and feedback;Patient/family education;Functional tasks;Language facilitation   Potential to Achieve Goals Good   Potential Considerations Ability to learn/carryover information      Patient will benefit from skilled therapeutic intervention in order to improve the following deficits and  impairments:   Dysarthria and anarthria    Problem List Patient Active Problem List   Diagnosis Date Noted  . 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 Gait disturbance, 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  . CVA (cerebral infarction) 08/09/2015    [facility] ,[place], [place]  09/17/2015, 12:28 PM  [facility] [address] [place], [name], [address] Phone: [phone]   Fax:  [phone]   Name: Jose Beck MRN: [id number] Date of Birth: June 27, 1945

## 2015-09-18 ENCOUNTER — Telehealth: Payer: Self-pay | Admitting: Neurology

## 2015-09-18 ENCOUNTER — Ambulatory Visit: Payer: Medicare Other | Admitting: Occupational Therapy

## 2015-09-18 ENCOUNTER — Encounter: Payer: Self-pay | Admitting: Occupational Therapy

## 2015-09-18 ENCOUNTER — Ambulatory Visit: Payer: Medicare Other

## 2015-09-18 ENCOUNTER — Ambulatory Visit: Payer: Medicare Other | Admitting: Physical Therapy

## 2015-09-18 DIAGNOSIS — G8191 Hemiplegia, unspecified affecting right dominant side: Secondary | ICD-10-CM | POA: Diagnosis not present

## 2015-09-18 DIAGNOSIS — R208 Other disturbances of skin sensation: Secondary | ICD-10-CM

## 2015-09-18 DIAGNOSIS — R293 Abnormal posture: Secondary | ICD-10-CM

## 2015-09-18 DIAGNOSIS — R471 Dysarthria and anarthria: Secondary | ICD-10-CM

## 2015-09-18 DIAGNOSIS — R260 Ataxic gait: Secondary | ICD-10-CM

## 2015-09-18 DIAGNOSIS — R41844 Frontal lobe and executive function deficit: Secondary | ICD-10-CM | POA: Diagnosis not present

## 2015-09-18 DIAGNOSIS — R2681 Unsteadiness on feet: Secondary | ICD-10-CM

## 2015-09-18 DIAGNOSIS — M6281 Muscle weakness (generalized): Secondary | ICD-10-CM

## 2015-09-18 DIAGNOSIS — R2689 Other abnormalities of gait and mobility: Secondary | ICD-10-CM

## 2015-09-18 DIAGNOSIS — R41841 Cognitive communication deficit: Secondary | ICD-10-CM

## 2015-09-18 NOTE — Therapy (Signed)
[facility] [address] [place], [name], [address] Phone: [phone]   Fax:  [phone]  Physical Therapy Treatment  Patient Details  Name: Jose Beck MRN: [id number] Date of Birth: April 24, 1946 Referring Provider: Dr. Alysia Penna  Encounter Date: 09/18/2015      PT End of Session - 09/18/15 1455    Visit Number 7   Number of Visits 16   Date for PT Re-Evaluation 10/16/15   PT Start Time 1400   PT Stop Time 1445   PT Time Calculation (min) 45 min   Equipment Utilized During Treatment Gait belt   Activity Tolerance Patient tolerated treatment well   Behavior During Therapy [facility] for tasks assessed/performed      Past Medical History  Diagnosis Date  . Stroke ([place])   . Hypertension   . Diabetes mellitus without complication ([place])   . Hyperlipemia   . Heart disease     No past surgical history on file.  There were no vitals filed for this visit.      Subjective Assessment - 09/18/15 1414    Subjective doing well   Currently in Pain? No/denies                         [facility] Adult PT Treatment/Exercise - 09/18/15 1414    Transfers   Comments simulated beach chair (low surface) transfer; pt minguard A with min cues for technique   Exercises   Exercises Knee/Hip   Knee/Hip Exercises: Seated   Sit to Sand 2 sets;10 reps;without UE support  from level and compliant surface             Balance Exercises - 09/18/15 1428    Balance Exercises: Standing   Standing Eyes Opened Narrow base of support (BOS);Head turns;Foam/compliant surface   Standing Eyes Closed Foam/compliant surface;Wide (BOA);5 reps;10 secs;Head turns   Sidestepping 2 reps;Upper extremity support  stepovers forwards/backwards             PT Short Term Goals - 08/21/15 0941    PT SHORT TERM GOAL #1   Title verbalize understanding of CVA risk factors/warning signs to decrease risk of reinjury  (09/18/15)   Time 4   Period Weeks   Status New   PT SHORT TERM GOAL #2   Title improve BERG balance score to >/= 38/56 for improved balance    PT SHORT TERM GOAL #3   Title improve timed up and go to < 30 sec with LRAD for improved mobility and function   PT SHORT TERM GOAL #4   Title ambulate > 250' with LRAD modified independent on indoor/paved outdoor surfaces for improved function   PT SHORT TERM GOAL #5   Title negotiate > 8 steps with 1 handrail and supervision for improved function and ability to negotiate stairs at [employer]           PT Long Term Goals - 08/21/15 0943    PT LONG TERM GOAL #1   Title independent with HEP (10/16/15)   Time 8   Period Weeks   Status New   PT LONG TERM GOAL #2   Title improve BERG balance score to >/= 45/56 for decreased fall risk   PT LONG TERM GOAL #3   Title improve gait velocity to > 2.62 ft/sec for improved function and mobility   PT LONG TERM GOAL #4   Title improve timed up and go with LRAD to <  25 sec for improved function and mobility   PT LONG TERM GOAL #5   Title ambulate > 500' on various indoor/outdoor surfaces with LRAD modified independent for improved function and mobility.               Plan - 09/18/15 1455    Clinical Impression Statement Pt with increased fatigue with standing compliant surface activities needing min A to get seated in chair.  Performed sit to/from stand from low surfaces to simulate beach chair transfers and pt minguard A.  Feel pt safe to try at beach this week but to get assistance to stand and if difficult to just sit in beach w/c.  Pt and wife verbalized understanding.   PT Next Visit Plan CVA ed; gait, continue with strengthening and balance activities; assess STGs   Consulted and Agree with Plan of Care Patient;Family member/caregiver      Patient will benefit from skilled therapeutic intervention in order to improve the following deficits and impairments:     Visit Diagnosis: Ataxic  gait  Unsteadiness on feet  Abnormal posture  Muscle weakness (generalized)  Other abnormalities of gait and mobility     Problem List Patient Active Problem List   Diagnosis Date Noted  . Brainstem infarct, acute ([place]) 09/17/2015  . 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 Obesity 09/17/2015  . 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 Gait disturbance, 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  . CVA (cerebral infarction) 08/09/2015   [name], PT, DPT 09/18/2015 2:58 PM  [facility] [address] [place], [name], [address] Phone: [phone]   Fax:  [phone]  Name: Chadney Gerardot MRN: [id number] Date of Birth: [birth year]

## 2015-09-18 NOTE — Therapy (Signed)
[facility] [address] [place], [name], [address] Phone: [phone]   Fax:  [phone]  Occupational Therapy Treatment  Patient Details  Name: Jose Beck MRN: [id number] Date of Birth: 05/09/1946 Referring Provider: Dr. Letta Pate  Encounter Date: 09/18/2015      OT End of Session - 09/18/15 1544    Visit Number 9   Number of Visits 16   Date for OT Re-Evaluation 10/16/15   Authorization Type medicare   Authorization Time Period 60 days, Pt will need G code and PN every 10th visit   Authorization - Visit Number 9   Authorization - Number of Visits 10   OT Start Time [id number]   OT Stop Time 1527   OT Time Calculation (min) 42 min   Activity Tolerance Patient tolerated treatment well      Past Medical History  Diagnosis Date  . Stroke ([place])   . Hypertension   . Diabetes mellitus without complication ([place])   . Hyperlipemia   . Heart disease     History reviewed. No pertinent past surgical history.  There were no vitals filed for this visit.      Subjective Assessment - 09/18/15 1450    Subjective  I am going to the beach tomorrow.    Patient is accompained by: Family member  wife   Pertinent History see epic   Patient Stated Goals To get back to normal - my speech, my toothbrush, my R leg   Currently in Pain? No/denies                      OT Treatments/Exercises (OP) - 09/18/15 0001    ADLs   Cooking Pt beginning to transition to using straight cane so used cooking activity to assess pt's ability to engage in functional task with straight cane vs walker. Pt with increased unsteadiness and with 2 episodes of LOB.  Pt with increaased  LE and trunk ataxia and increased apraxia due to increaased overall demand of task.  Long discussion with pt and wife regarding gait with a cane vs functional ambulation with emphasis on increased risk of fall when attempting a task with the cane.  Pt  and wife able to verbalize understanding.              Balance Exercises - 09/18/15 1428    Balance Exercises: Standing   Standing Eyes Opened Narrow base of support (BOS);Head turns;Foam/compliant surface   Standing Eyes Closed Foam/compliant surface;Wide (BOA);5 reps;10 secs;Head turns   Sidestepping 2 reps;Upper extremity support  stepovers forwards/backwards             OT Short Term Goals - 09/18/15 1537    OT SHORT TERM GOAL #1   Title Pt and wife will be mod I with HEP - 09/18/2015   Status Achieved   OT SHORT TERM GOAL #2   Title Pt will be mod I with toilet transfers   Status Achieved   OT SHORT TERM GOAL #3   Title Pt will be mod I with shower transfers   Status On-going  wife still providing distant supevision   OT SHORT TERM GOAL #4   Title Pt will be able to cut meat, AE prn   Status Achieved   OT SHORT TERM GOAL #5   Title Pt will improve functional reaching/coordination as shown by improving score on box and blocks test by at least 10 bilaterally.   Baseline R-19blocks, L-29 blocks  Status Achieved  R=25, L = 39   OT SHORT TERM GOAL #6   Title Pt will demonstrate improved awareness as evidenced by being able to identify 2 cognitive deficits he is working on   Status Achieved           OT Long Term Goals - 09/18/15 1537    OT [place] #1   Title Pt and wife will be mod I with upgraded HEP - 10/16/2015   Status On-going   OT LONG TERM GOAL #2   Title Pt will be mod I with simple hot familiar meal prep   Status On-going   OT LONG TERM GOAL #3   Title Pt will need no more than 2 vc's for moderate complex problem sovling within the context of a functional task.   Status On-going   OT LONG TERM GOAL #4   Title Pt will demonstrate improved safety awareness with functional mobility to reach mod I level   Status On-going   OT LONG TERM GOAL #5   Title Pt will demo improved coordination for ADLs as shown by completing 9-hole peg in 70sec or  less with RUE.   Baseline 53min 55.16sec   Status On-going               Plan - 09/18/15 1537    Clinical Impression Statement Pt progressing toward goals.  Pt and wife planning trip to beach and discussed safety and challenges   Rehab Potential Good   Clinical Impairments Affecting Rehab Potential decreaed awareness, cognition;  wife also appears to have poor understanding of pt's cogntive deficits.    OT Frequency 2x / week   OT Duration 8 weeks   OT Treatment/Interventions Self-care/ADL training;Therapeutic exercise;Neuromuscular education;DME and/or AE instruction;Manual Therapy;Therapist, nutritional;Therapeutic activities;Cognitive remediation/compensation;Patient/family education;Balance training;Moist Heat;Ultrasound   Plan balance retraining, NMR for trunk and RUE control, cognition   Consulted and Agree with Plan of Care Patient;Family member/caregiver   Family Member Consulted wife      Patient will benefit from skilled therapeutic intervention in order to improve the following deficits and impairments:  Abnormal gait, Cardiopulmonary status limiting activity, Decreased balance, Decreased cognition, Decreased coordination, Decreased safety awareness, Decreased mobility, Decreased strength, Difficulty walking, Impaired UE functional use, Impaired sensation  Visit Diagnosis: Hemiplegia, unspecified affecting right dominant side (HCC)  Frontal lobe and executive function deficit  Other disturbances of skin sensation  Muscle weakness (generalized)  Other abnormalities of gait and mobility  Abnormal posture  Unsteadiness on feet    Problem List Patient Active Problem List   Diagnosis Date Noted  . Brainstem infarct, acute ([place]) 09/17/2015  . 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 Obesity 09/17/2015  . 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 Gait disturbance, 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  . CVA (cerebral infarction)  08/09/2015    Quay Burow, OTR/L 09/18/2015, 3:46 PM  [facility] [address] [place], [name], [address] Phone: [phone]   Fax:  [phone]  Name: Jose Beck MRN: [id number] Date of Birth: 11/24/1945

## 2015-09-18 NOTE — Telephone Encounter (Addendum)
Wife [name] called to request written verification of husband's stroke for a trip that they have to cancel. Wife states she will be dropping off information to our office at 2:00pm today and would like to pick letter up at this time if possible, otherwise will need in approximately (1) week. Wife requests to call cell phone [phone].

## 2015-09-18 NOTE — Telephone Encounter (Signed)
Rn call patients wife Jose Beck about the letter and form needed for a trip they had to cancel. Jose Beck stated they dont need the form or letter now. Jose Beck stated" We got half out deposit back from the trip, so we are happy with that." Jose Beck stated she does not need now. Jose Beck ask about a nutritionist for his diabetes. Rn stated she can call patients PCP or endocrinologist about a referral for his diabetes diet. Pts wife verbalized understanding.

## 2015-09-19 NOTE — Patient Instructions (Signed)
Complete the homework provided before your next session.

## 2015-09-19 NOTE — Therapy (Signed)
[facility] [address] [place], [name], [address] Phone: [phone]   Fax:  [phone]  Speech Language Pathology Treatment  Patient Details  Name: Jose Beck MRN: [id number] Date of Birth: 1945-12-26 Referring Provider: Dr, Lupe Carney  Encounter Date: 09/18/2015      End of Session - 09/19/15 1040    Visit Number 7   Number of Visits 17   Date for SLP Re-Evaluation 10/23/15   SLP Start Time 1535   SLP Stop Time  1616   SLP Time Calculation (min) 41 min   Activity Tolerance Patient tolerated treatment well      Past Medical History  Diagnosis Date  . Stroke ([place])   . Hypertension   . Diabetes mellitus without complication ([place])   . Hyperlipemia   . Heart disease     No past surgical history on file.  There were no vitals filed for this visit.      Subjective Assessment - 09/18/15 1541    Subjective Pt did not practice last night or today, since last ST. He req'd cues from wife to recall details of MD visit yesterday Leonie Man)   Patient is accompained by: Family member  Wife               ADULT SLP TREATMENT - 09/18/15 1545    General Information   Behavior/Cognition Alert;Cooperative;Pleasant mood   Treatment Provided   Treatment provided Cognitive-Linquistic   Cognitive-Linquistic Treatment   Treatment focused on Dysarthria   Skilled Treatment Pt arrived and spoke with SLP about MD visit yesterday with reduced intelligibility, without attempts to correct dysarthric articulation - overall intelligibility approx 90%. In semi-structured tasks (sequencing picture description) pt req'd min cues occasionally to reduce rate and to overarticulate. He self corrected unintelligible words approx 40% of the time (4/9).   Assessment / Recommendations / Plan   Plan Continue with current plan of care   Progression Toward Goals   Progression toward goals Progressing toward goals             SLP Short Term Goals - 09/18/15 1735    SLP SHORT TERM GOAL #1   Title Pt will perform dysarthria HEP with mod I   Time 2   Period Weeks   Status On-going   SLP SHORT TERM GOAL #2   Title Pt will utilize compensations for dysarthira during structured tasks with rare min A over two sessions   Baseline one session 09-18-15   Time 2   Period Weeks   Status Revised   SLP SHORT TERM GOAL #3   Title Pt will demonstrate utterance length of 5-7 words during structured speech/descriptive tasks with rare min A   Time 2   Period Weeks   Status On-going          SLP Long Term Goals - 09/19/15 1043    SLP LONG TERM GOAL #1   Title Pt will be 95% intellgible during simple conversation of 10 minutes with rare min A, over three sessions   Time 6   Period Weeks   Status Revised   SLP LONG TERM GOAL #2   Title Pt will participate in mildly complex conversation over 12 minutes with less than 3 requests for clarification/repeair communication breakdown.    Time 6   Period Weeks   Status On-going   SLP LONG TERM GOAL #3   Title Pt will utilize compensations for short term memory to recall details, lists, messages with  85% accuracy and rare min A.   Time 6   Period Weeks   Status On-going          Plan - 09/19/15 1041    Clinical Impression Statement Pt's need for verbal cues/instructions for awareness of dysarthric errors and to employ compensations for dysarthria was less today than last week. Continue skilled ST to maximize intellgibility, awareness and utterance length.   Speech Therapy Frequency 2x / week   Duration --  6 weeks   Treatment/Interventions Cognitive reorganization;Internal/external aids;Compensatory strategies;SLP instruction and feedback;Patient/family education;Functional tasks;Language facilitation   Potential to Achieve Goals Good   Potential Considerations Ability to learn/carryover information      Patient will benefit from skilled therapeutic  intervention in order to improve the following deficits and impairments:   Dysarthria and anarthria  Cognitive communication deficit    Problem List Patient Active Problem List   Diagnosis Date Noted  . Brainstem infarct, acute ([place]) 09/17/2015  . 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 Obesity 09/17/2015  . 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 Gait disturbance, 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  . CVA (cerebral infarction) 08/09/2015    [facility] ,[place], [place]  09/19/2015, 10:44 AM  [facility] [address] [place], [name], [address] Phone: [phone]   Fax:  [phone]   Name: Vinn Zaner MRN: [id number] Date of Birth: 1945/07/12

## 2015-09-27 ENCOUNTER — Ambulatory Visit: Payer: Medicare Other | Admitting: Occupational Therapy

## 2015-09-27 ENCOUNTER — Ambulatory Visit: Payer: Medicare Other | Admitting: Speech Pathology

## 2015-09-27 ENCOUNTER — Encounter: Payer: Self-pay | Admitting: Occupational Therapy

## 2015-09-27 VITALS — BP 168/94 | HR 90

## 2015-09-27 DIAGNOSIS — R41844 Frontal lobe and executive function deficit: Secondary | ICD-10-CM | POA: Diagnosis not present

## 2015-09-27 DIAGNOSIS — R208 Other disturbances of skin sensation: Secondary | ICD-10-CM

## 2015-09-27 DIAGNOSIS — G8191 Hemiplegia, unspecified affecting right dominant side: Secondary | ICD-10-CM

## 2015-09-27 DIAGNOSIS — R278 Other lack of coordination: Secondary | ICD-10-CM

## 2015-09-27 DIAGNOSIS — R471 Dysarthria and anarthria: Secondary | ICD-10-CM

## 2015-09-27 DIAGNOSIS — R2689 Other abnormalities of gait and mobility: Secondary | ICD-10-CM | POA: Diagnosis not present

## 2015-09-27 DIAGNOSIS — R41841 Cognitive communication deficit: Secondary | ICD-10-CM

## 2015-09-27 DIAGNOSIS — R2681 Unsteadiness on feet: Secondary | ICD-10-CM

## 2015-09-27 DIAGNOSIS — R293 Abnormal posture: Secondary | ICD-10-CM | POA: Diagnosis not present

## 2015-09-27 NOTE — Therapy (Signed)
[facility] [address] [place], [name], [address] Phone: [phone]   Fax:  [phone]  Occupational Therapy Treatment  Patient Details  Name: Jose Beck MRN: [id number] Date of Birth: 1946/03/02 Referring Provider: Dr. Letta Pate  Encounter Date: 09/27/2015      OT End of Session - 09/27/15 1634    Visit Number 10   Number of Visits 16   Date for OT Re-Evaluation 10/16/15   Authorization Type medicare   Authorization Time Period 60 days, Pt will need G code and PN every 10th visit   Authorization - Visit Number 10   Authorization - Number of Visits 10   OT Start Time 1318   OT Stop Time 1400   OT Time Calculation (min) 42 min   Activity Tolerance Patient tolerated treatment well      Past Medical History  Diagnosis Date  . Stroke ([place])   . Hypertension   . Diabetes mellitus without complication ([place])   . Hyperlipemia   . Heart disease     History reviewed. No pertinent past surgical history.  Filed Vitals:   09/27/15 1328  BP: 168/94  Pulse: 90        Subjective Assessment - 09/27/15 1328    Subjective  I didn't do any of my exercises while we were on vacation.                      OT Treatments/Exercises (OP) - 09/27/15 0001    ADLs   ADL Comments Pt with increased difficulty with ST today therefore rechecked status of last goals to ensure no other functional changes.  There does not appear to any other changes and wife repots no other changes.  Will continue to monitor. Box and Blocks R= 24 (baseline = 19), L= 37 (baseline = 29).  9 hole peg  1.16.40 (baseline = 1 min.55.16 sec.)   Neurological Re-education Exercises   Other Exercises 1 Neuro re ed with focus on functional unilateral and bilateral use of RUE - pt with moderate difficulty with tasks however performance improves repetition and cues to use vision to guide use of RUE.  Pt becomes easily frustrated howvever  willing to work through tasks. Also incorporated fine motor tasks, hand orienation to task and decreasing dystonic posturing.                  OT Short Term Goals - 09/27/15 1631    OT SHORT TERM GOAL #1   Title Pt and wife will be mod I with HEP - 09/18/2015   Status Achieved   OT SHORT TERM GOAL #2   Title Pt will be mod I with toilet transfers   Status Achieved   OT SHORT TERM GOAL #3   Title Pt will be mod I with shower transfers   Status On-going  wife still providing distant supevision   OT SHORT TERM GOAL #4   Title Pt will be able to cut meat, AE prn   Status Achieved   OT SHORT TERM GOAL #5   Title Pt will improve functional reaching/coordination as shown by improving score on box and blocks test by at least 10 bilaterally.   Baseline R-19blocks, L-29 blocks   Status Achieved  R=25, L = 39   OT SHORT TERM GOAL #6   Title Pt will demonstrate improved awareness as evidenced by being able to identify 2 cognitive deficits he is working on   Status Achieved  OT Long Term Goals - 2015/10/03 1631    OT LONG TERM GOAL #1   Title Pt and wife will be mod I with upgraded HEP - 10/16/2015   Status On-going   OT LONG TERM GOAL #2   Title Pt will be mod I with simple hot familiar meal prep   Status On-going   OT LONG TERM GOAL #3   Title Pt will need no more than 2 vc's for moderate complex problem sovling within the context of a functional task.   Status On-going   OT LONG TERM GOAL #4   Title Pt will demonstrate improved safety awareness with functional mobility to reach mod I level   Status On-going   OT LONG TERM GOAL #5   Title Pt will demo improved coordination for ADLs as shown by completing 9-hole peg in 70sec or less with RUE.   Baseline 46min 55.16sec   Status On-going  03-Oct-2015 1.16 seconds               Plan - 2015/10/03 1632    Clinical Impression Statement Pt with slow progress toward goals. Pt with more difficulty with speech outpt  today however all other functional measures either improved or unchanged.  Will continue to monitor   Rehab Potential Good   Clinical Impairments Affecting Rehab Potential decreaed awareness, cognition;  wife also appears to have poor understanding of pt's cogntive deficits.    OT Frequency 2x / week   OT Duration 8 weeks   OT Treatment/Interventions Self-care/ADL training;Therapeutic exercise;Neuromuscular education;DME and/or AE instruction;Manual Therapy;Therapist, nutritional;Therapeutic activities;Cognitive remediation/compensation;Patient/family education;Balance training;Moist Heat;Ultrasound   Plan balance retraining, NMR for trunk and RUE control, cognition   Consulted and Agree with Plan of Care Patient;Family member/caregiver   Family Member Consulted wife      Patient will benefit from skilled therapeutic intervention in order to improve the following deficits and impairments:  Abnormal gait, Cardiopulmonary status limiting activity, Decreased balance, Decreased cognition, Decreased coordination, Decreased safety awareness, Decreased mobility, Decreased strength, Difficulty walking, Impaired UE functional use, Impaired sensation  Visit Diagnosis: Hemiplegia, unspecified affecting right dominant side (HCC)  Frontal lobe and executive function deficit  Other disturbances of skin sensation  Other lack of coordination  Unsteadiness on feet      G-Codes - 10/03/15 1638    Functional Assessment Tool Used 9 hole peg, box and blocks, skilled clinical observation   Functional Limitation Self care   Self Care Current Status [id number]) At least 60 percent but less than 80 percent impaired, limited or restricted   Self Care Goal Status [id number]) At least 40 percent but less than 60 percent impaired, limited or restricted      Problem List Patient Active Problem List   Diagnosis Date Noted  . Brainstem infarct, acute ([place]) 09/17/2015  . 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 Obesity 09/17/2015  . 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 Gait disturbance, 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  . CVA (cerebral infarction) 08/09/2015   Occupational Therapy Progress Note  Dates of Reporting Period: 08/21/2015 to 10-03-2015  Objective Reports of Subjective Statement: see above  Objective Measurements: see above  Goal Update: see above  Plan: see above  Reason Skilled Services are Required: see above  Quay Burow, OTR/L 10/03/2015, 4:39 PM  [facility] [address] [place], [name], [address] Phone: [phone]   Fax:  [phone]  Name: Jose Beck MRN: [id number] Date of Birth: January 16, 1946

## 2015-09-27 NOTE — Therapy (Signed)
[facility] [address] [place], [name], [address] Phone: [phone]   Fax:  [phone]  Speech Language Pathology Treatment  Patient Details  Name: Jose Beck MRN: [id number] Date of Birth: Aug 28, 1945 Referring Provider: Dr, Lupe Carney  Encounter Date: 09/27/2015      End of Session - 09/27/15 2007    Visit Number 8   Number of Visits 17   Date for SLP Re-Evaluation 10/23/15   SLP Start Time [id number]   SLP Stop Time  1401   SLP Time Calculation (min) 44 min      Past Medical History  Diagnosis Date  . Stroke ([place])   . Hypertension   . Diabetes mellitus without complication ([place])   . Hyperlipemia   . Heart disease     No past surgical history on file.  There were no vitals filed for this visit.      Subjective Assessment - 09/27/15 1237    Subjective Pt reported he practiced on the way down to the beach but not anyother time or on the way home   Currently in Pain? No/denies               ADULT SLP TREATMENT - 09/27/15 1242    General Information   Behavior/Cognition Alert;Cooperative;Pleasant mood   Treatment Provided   Treatment provided Cognitive-Linquistic   Pain Assessment   Pain Assessment No/denies pain   Cognitive-Linquistic Treatment   Treatment focused on Dysarthria   Skilled Treatment Pt attemped to tell ST how is wife lost her credit card, however narrative is incohesive with ST needing frequent questioning for clarification. Spouse reports pt did not talk much over their beach weekend. Pt and spouse also report some episodes of apathy. Moderately complex naming tasks with 90% accuracy, extended time and semantic cues. Pt used compenstoins for aphasia with usual min cues. Verbal expression /narrative faciliated with having pt sequence moderately complex directions with 80% accuracy and mod A/questioning. Dysarthria persists, however speech 85% intelligible with min A.     Assessment / Recommendations / Plan   Plan Continue with current plan of care   Progression Toward Goals   Progression toward goals Progressing toward goals            SLP Short Term Goals - 09/27/15 2006    SLP SHORT TERM GOAL #1   Title Pt will perform dysarthria HEP with mod I   Time 1   Period Weeks   Status On-going   SLP SHORT TERM GOAL #2   Title Pt will utilize compensations for dysarthira during structured tasks with rare min A over two sessions   Baseline one session 09-18-15   Time 1   Period Weeks   Status Revised   SLP SHORT TERM GOAL #3   Title Pt will demonstrate utterance length of 5-7 words during structured speech/descriptive tasks with rare min A   Time 1   Period Weeks   Status On-going          SLP Long Term Goals - 09/27/15 2006    SLP LONG TERM GOAL #1   Title Pt will be 95% intellgible during simple conversation of 10 minutes with rare min A, over three sessions   Time 5   Period Weeks   Status Revised   SLP LONG TERM GOAL #2   Title Pt will participate in mildly complex conversation over 12 minutes with less than 3 requests for clarification/repeair communication breakdown.    Time  5   Period Weeks   Status On-going   SLP LONG TERM GOAL #3   Title Pt will utilize compensations for short term memory to recall details, lists, messages with 85% accuracy and rare min A.   Time 5   Period Weeks   Status On-going          Plan - 09/27/15 2006    Clinical Impression Statement Pt's need for verbal cues/instructions for awareness of dysarthric errors and to employ compensations for dysarthria was less today than last week. Continue skilled ST to maximize intellgibility, awareness and utterance length.      Patient will benefit from skilled therapeutic intervention in order to improve the following deficits and impairments:   Dysarthria and anarthria  Cognitive communication deficit    Problem List Patient Active Problem List    Diagnosis Date Noted  . Brainstem infarct, acute ([place]) 09/17/2015  . Occlusion and stenosis of vertebral artery with cerebral infarction ([place]) 09/17/2015  . Obesity 09/17/2015  . 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 Gait disturbance, 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  . CVA (cerebral infarction) 08/09/2015    Hatice Bubel, [place], CCC-SLP 09/27/2015, 8:08 PM  [facility] [address] [place], [name], [address] Phone: [phone]   Fax:  [phone]   Name: Jose Beck MRN: :[id number] Date of Birth: January 31, 1946

## 2015-10-01 ENCOUNTER — Ambulatory Visit: Payer: Medicare Other | Admitting: Occupational Therapy

## 2015-10-01 ENCOUNTER — Ambulatory Visit: Payer: Medicare Other

## 2015-10-01 ENCOUNTER — Ambulatory Visit: Payer: Medicare Other | Admitting: Physical Therapy

## 2015-10-01 ENCOUNTER — Encounter: Payer: Self-pay | Admitting: Occupational Therapy

## 2015-10-01 DIAGNOSIS — M6281 Muscle weakness (generalized): Secondary | ICD-10-CM

## 2015-10-01 DIAGNOSIS — R278 Other lack of coordination: Secondary | ICD-10-CM

## 2015-10-01 DIAGNOSIS — R471 Dysarthria and anarthria: Secondary | ICD-10-CM | POA: Diagnosis not present

## 2015-10-01 DIAGNOSIS — R41844 Frontal lobe and executive function deficit: Secondary | ICD-10-CM

## 2015-10-01 DIAGNOSIS — R2681 Unsteadiness on feet: Secondary | ICD-10-CM

## 2015-10-01 DIAGNOSIS — R208 Other disturbances of skin sensation: Secondary | ICD-10-CM

## 2015-10-01 DIAGNOSIS — R293 Abnormal posture: Secondary | ICD-10-CM

## 2015-10-01 DIAGNOSIS — R2689 Other abnormalities of gait and mobility: Secondary | ICD-10-CM

## 2015-10-01 DIAGNOSIS — G8191 Hemiplegia, unspecified affecting right dominant side: Secondary | ICD-10-CM

## 2015-10-01 DIAGNOSIS — R260 Ataxic gait: Secondary | ICD-10-CM

## 2015-10-01 DIAGNOSIS — R41841 Cognitive communication deficit: Secondary | ICD-10-CM

## 2015-10-01 NOTE — Therapy (Signed)
[facility] [address] [place], [name], [address] Phone: [phone]   Fax:  [phone]  Occupational Therapy Treatment  Patient Details  Name: Jose Beck MRN: [id number] Date of Birth: 1945/11/15 Referring Provider: Dr. Letta Pate  Encounter Date: 10/01/2015      OT End of Session - 10/01/15 1623    Visit Number 11   Number of Visits 16   Date for OT Re-Evaluation 10/16/15   Authorization Type medicare   Authorization Time Period 60 days, Pt will need G code and PN every 10th visit   Authorization - Visit Number 11   Authorization - Number of Visits 20   OT Start Time 1315   OT Stop Time 1359   OT Time Calculation (min) 44 min   Activity Tolerance Patient tolerated treatment well      Past Medical History  Diagnosis Date  . Stroke ([facility])   . Hypertension   . Diabetes mellitus without complication ([place])   . Hyperlipemia   . Heart disease     History reviewed. No pertinent past surgical history.  There were no vitals filed for this visit.      Subjective Assessment - 10/01/15 1322    Subjective  A friend of mine had a stroke.    Patient is accompained by: Family member  wife   Pertinent History see epic   Patient Stated Goals To get back to normal - my speech, my toothbrush, my R leg   Currently in Pain? No/denies                      OT Treatments/Exercises (OP) - 10/01/15 0001    Neurological Re-education Exercises   Other Exercises 1 Neuro re ed to address static and dynamic standing balance, standing tolerance, functional ambulation and functional use of BUE's. Pt with L bias during tasks and ambulation and often demonstrates dystonic posturing with RUE especially with functional ambulation. This imoproves as alignment improves.  Pt can break out of postuing with cues. Pt with improving balance and acivity tolerance.                   OT Short Term Goals -  10/01/15 1325    OT SHORT TERM GOAL #1   Title Pt and wife will be mod I with HEP - 09/18/2015   Status Achieved   OT SHORT TERM GOAL #2   Title Pt will be mod I with toilet transfers   Status Achieved   OT SHORT TERM GOAL #3   Title Pt will be mod I with shower transfers   Status Achieved  wife still providing distant supevision   OT SHORT TERM GOAL #4   Title Pt will be able to cut meat, AE prn   Status Achieved   OT SHORT TERM GOAL #5   Title Pt will improve functional reaching/coordination as shown by improving score on box and blocks test by at least 10 bilaterally.   Baseline R-19blocks, L-29 blocks   Status Achieved  R=25, L = 39   OT SHORT TERM GOAL #6   Title Pt will demonstrate improved awareness as evidenced by being able to identify 2 cognitive deficits he is working on   Status Achieved           OT Long Term Goals - 10/01/15 1326    OT LONG TERM GOAL #1   Title Pt and wife will be mod I with upgraded HEP -  10/16/2015   Status On-going   OT LONG TERM GOAL #2   Title Pt will be mod I with simple hot familiar meal prep   Status On-going   OT LONG TERM GOAL #3   Title Pt will need no more than 2 vc's for moderate complex problem sovling within the context of a functional task.   Status On-going   OT LONG TERM GOAL #4   Title Pt will demonstrate improved safety awareness with functional mobility to reach mod I level   Status On-going   OT LONG TERM GOAL #5   Title Pt will demo improved coordination for ADLs as shown by completing 9-hole peg in 70sec or less with RUE.   Baseline 59min 55.16sec   Status On-going  09/27/2015 1.16 seconds               Plan - 10/01/15 1622    Clinical Impression Statement Pt progreasing toward goals. Pt with improved balance, UE functional use and activity tolerance.    Rehab Potential Good   Clinical Impairments Affecting Rehab Potential decreaed awareness, cognition;  wife also appears to have poor understanding of pt's  cogntive deficits.    OT Frequency 2x / week   OT Duration 8 weeks   Plan balance retraining, NMR for trunk and RUE control, cognition   Consulted and Agree with Plan of Care Patient;Family member/caregiver   Family Member Consulted wife      Patient will benefit from skilled therapeutic intervention in order to improve the following deficits and impairments:  Abnormal gait, Cardiopulmonary status limiting activity, Decreased balance, Decreased cognition, Decreased coordination, Decreased safety awareness, Decreased mobility, Decreased strength, Difficulty walking, Impaired UE functional use, Impaired sensation  Visit Diagnosis: Hemiplegia, unspecified affecting right dominant side (HCC)  Frontal lobe and executive function deficit  Other disturbances of skin sensation  Other lack of coordination  Unsteadiness on feet    Problem List Patient Active Problem List   Diagnosis Date Noted  . Brainstem infarct, acute ([place]) 09/17/2015  . Occlusion and stenosis of vertebral artery with cerebral infarction ([place]) 09/17/2015  . Obesity 09/17/2015  . 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 Gait disturbance, 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  . CVA (cerebral infarction) 08/09/2015    Quay Burow, OTR/L 10/01/2015, 4:25 PM  [facility] [address] [place], [name], [address] Phone: [phone]   Fax:  [phone]  Name: Kayvin Moody MRN: [id number] Date of Birth: November 30, 1945

## 2015-10-01 NOTE — Therapy (Signed)
[facility] [address] [place], [name], [address] Phone: [phone]   Fax:  [phone]  Physical Therapy Treatment  Patient Details  Name: Jose Beck MRN: [id number] Date of Birth: 1945/12/01 Referring Provider: Dr. Alysia Penna  Encounter Date: 10/01/2015      PT End of Session - 10/01/15 1451    Visit Number 8   Number of Visits 16   Date for PT Re-Evaluation 10/16/15   PT Start Time 1401   PT Stop Time 1443   PT Time Calculation (min) 42 min   Equipment Utilized During Treatment Gait belt   Activity Tolerance Patient tolerated treatment well   Behavior During Therapy [facility] for tasks assessed/performed      Past Medical History  Diagnosis Date  . Stroke ([place])   . Hypertension   . Diabetes mellitus without complication ([place])   . Hyperlipemia   . Heart disease     No past surgical history on file.  There were no vitals filed for this visit.      Subjective Assessment - 10/01/15 1412    Subjective "I did well at the beach and she did better"   Pertinent History HTN, HLD, DM, CAD   Patient Stated Goals improve function and mobility; wants to go to beach in May with many stairs to navigate   Currently in Pain? No/denies                         [facility] Adult PT Treatment/Exercise - 10/01/15 1418    Ambulation/Gait   Ambulation/Gait Yes   Ambulation/Gait Assistance 5: Supervision;4: Min assist   Ambulation/Gait Assistance Details min A needed with sequencing with cane in RUE   Ambulation Distance (Feet) 275 Feet   Assistive device Straight cane   Gait Pattern Step-through pattern;Poor foot clearance - right;Decreased arm swing - right   Ambulation Surface Level;Indoor;Paved;Outdoor   Stairs Yes   Stairs Assistance 5: Supervision   Stair Management Technique One rail Right;With cane   Number of Stairs 8   Height of Stairs 6   Berg Balance Test   Sit to Stand Able to  stand without using hands and stabilize independently   Standing Unsupported Able to stand safely 2 minutes   Sitting with Back Unsupported but Feet Supported on Floor or Stool Able to sit safely and securely 2 minutes   Stand to Sit Sits safely with minimal use of hands   Transfers Able to transfer safely, minor use of hands   Standing Unsupported with Eyes Closed Able to stand 10 seconds safely   Standing Ubsupported with Feet Together Able to place feet together independently and stand 1 minute safely   From Standing, Reach Forward with Outstretched Arm Can reach forward >12 cm safely (5")   From Standing Position, Pick up Object from Floor Able to pick up shoe, needs supervision   From Standing Position, Turn to Look Behind Over each Shoulder Looks behind one side only/other side shows less weight shift   Turn 360 Degrees Needs close supervision or verbal cueing   Standing Unsupported, Alternately Place Feet on Step/Stool Able to complete 4 steps without aid or supervision   Standing Unsupported, One Foot in Front Able to plae foot ahead of the other independently and hold 30 seconds   Standing on One Leg Tries to lift leg/unable to hold 3 seconds but remains standing independently   Total Score 44   Timed Up and  Go Test   TUG Normal TUG   Normal TUG (seconds) 16.09   Self-Care   Self-Care Other Self-Care Comments   Other Self-Care Comments  educated on risk factor/warning signs of CVA; reinforced need to complete HEP as instructed as pt with limited compliance                PT Education - 10/01/15 1451    Education provided Yes   Education Details CVA ed   [employer]) Educated Spouse;Patient   Methods Explanation   Comprehension Verbalized understanding          PT Short Term Goals - 10/01/15 1423    PT SHORT TERM GOAL #1   Title verbalize understanding of CVA risk factors/warning signs to decrease risk of reinjury (09/18/15)   Status Achieved   PT SHORT TERM GOAL #2    Title improve BERG balance score to >/= 38/56 for improved balance    Status Achieved   PT SHORT TERM GOAL #3   Title improve timed up and go to < 30 sec with LRAD for improved mobility and function   Status Achieved   PT SHORT TERM GOAL #4   Title ambulate > 250' with LRAD modified independent on indoor/paved outdoor surfaces for improved function   Status Partially Met   PT SHORT TERM GOAL #5   Title negotiate > 8 steps with 1 handrail and supervision for improved function and ability to negotiate stairs at beach house   Status Achieved           PT Long Term Goals - 08/21/15 0943    PT LONG TERM GOAL #1   Title independent with HEP (10/16/15)   Time 8   Period Weeks   Status New   PT LONG TERM GOAL #2   Title improve BERG balance score to >/= 45/56 for decreased fall risk   PT LONG TERM GOAL #3   Title improve gait velocity to > 2.62 ft/sec for improved function and mobility   PT LONG TERM GOAL #4   Title improve timed up and go with LRAD to < 25 sec for improved function and mobility   PT LONG TERM GOAL #5   Title ambulate > 500' on various indoor/outdoor surfaces with LRAD modified independent for improved function and mobility.               Plan - 10/01/15 1451    Clinical Impression Statement Pt has met all STGs except amb modified independent.  Pt is still supervision and need min A to sequence cane in RUE.  Progressing well towards LTGs.     PT Next Visit Plan strengthening, gait without device and cane in RUE, balance   Consulted and Agree with Plan of Care Patient      Patient will benefit from skilled therapeutic intervention in order to improve the following deficits and impairments:     Visit Diagnosis: Unsteadiness on feet  Ataxic gait  Abnormal posture  Muscle weakness (generalized)  Other abnormalities of gait and mobility     Problem List Patient Active Problem List   Diagnosis Date Noted  . Brainstem infarct, acute ([place])  09/17/2015  . Occlusion and stenosis of vertebral artery with cerebral infarction ([place]) 09/17/2015  . Obesity 09/17/2015  . 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 Gait disturbance, 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  . CVA (cerebral infarction) 08/09/2015   [name], PT, DPT 10/01/2015 2:55 PM  [facility] [address]  [place], [name], [address] Phone: [phone]   Fax:  [phone]  Name: Jose Beck MRN: [id number] Date of Birth: Oct 19, 1945

## 2015-10-01 NOTE — Therapy (Signed)
[facility] [address] [place], [name], [address] Phone: [phone]   Fax:  [phone]  Speech Language Pathology Treatment  Patient Details  Name: Jose Beck MRN: [id number] Date of Birth: Aug 29, 1945 Referring Provider: Dr, Lupe Carney  Encounter Date: 10/01/2015      End of Session - 10/01/15 1634    Visit Number 9   Number of Visits 17   Date for SLP Re-Evaluation 10/23/15   SLP Start Time 1449   SLP Stop Time  1530   SLP Time Calculation (min) 41 min   Activity Tolerance Patient tolerated treatment well      Past Medical History  Diagnosis Date  . Stroke ([place])   . Hypertension   . Diabetes mellitus without complication ([place])   . Hyperlipemia   . Heart disease     No past surgical history on file.  There were no vitals filed for this visit.             ADULT SLP TREATMENT - 10/01/15 1528    General Information   Behavior/Cognition Alert;Cooperative;Pleasant mood   Treatment Provided   Treatment provided Cognitive-Linquistic   Pain Assessment   Pain Assessment No/denies pain   Cognitive-Linquistic Treatment   Treatment focused on Dysarthria   Skilled Treatment Pt reports he is bothered by his speech 6/10 (10=most bothered). Prior to the beach trip it was 5/10.  Sentence tasks today req'd min A occasionally for speech compensation use. Pt was 85-90% intelligible overall.    Assessment / Recommendations / Plan   Plan Continue with current plan of care   Progression Toward Goals   Progression toward goals Progressing toward goals            SLP Short Term Goals - 10/01/15 1506    SLP SHORT TERM GOAL #1   Title Pt will perform dysarthria HEP with mod I   Time 1   Period Weeks   Status Not Met   SLP SHORT TERM GOAL #2   Title Pt will utilize compensations for dysarthira during structured tasks with rare min A over two sessions   Baseline one session 09-18-15   Time  --   Period --   Status Achieved   SLP SHORT TERM GOAL #3   Title Pt will demonstrate utterance length of 5-7 words during structured speech/descriptive tasks with rare min A   Time --   Period --   Status Achieved          SLP Long Term Goals - 10/01/15 1508    SLP LONG TERM GOAL #1   Title Pt will be 95% intellgible during simple conversation of 10 minutes with rare min A, over three sessions   Baseline two sessions 10-01-15   Time 4   Period Weeks   Status Revised   SLP LONG TERM GOAL #2   Title Pt will participate in mildly complex conversation over 12 minutes with less than 3 requests for clarification/repeair communication breakdown over 3 sessions   Time 4   Period Weeks   Status Revised   SLP LONG TERM GOAL #3   Title Pt will utilize compensations for short term memory to recall details, lists, messages with 85% accuracy and rare min A.   Time 4   Period Weeks   Status On-going          Plan - 10/01/15 1500    Clinical Impression Statement Pt's need for verbal cues/instructions for awareness of dysarthric  errors and to employ compensations for dysarthria continues. Continue skilled ST to maximize intellgibility, awareness and utterance length.   Speech Therapy Frequency 2x / week   Duration 4 weeks   Treatment/Interventions Cognitive reorganization;Internal/external aids;Compensatory strategies;SLP instruction and feedback;Patient/family education;Functional tasks;Language facilitation   Potential to Achieve Goals Good   Potential Considerations Ability to learn/carryover information      Patient will benefit from skilled therapeutic intervention in order to improve the following deficits and impairments:   Dysarthria and anarthria  Cognitive communication deficit    Problem List Patient Active Problem List   Diagnosis Date Noted  . Brainstem infarct, acute ([place]) 09/17/2015  . 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 Obesity 09/17/2015  . 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 Gait disturbance, 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  . CVA (cerebral infarction) 08/09/2015    [facility] ,[place], CCC-SLP  10/01/2015, 4:36 PM  [facility] [address] [place], [name], [address] Phone: [phone]   Fax:  [phone]   Name: Jose Beck MRN: [id number] Date of Birth: 17-Aug-1945

## 2015-10-04 ENCOUNTER — Encounter: Payer: Self-pay | Admitting: Occupational Therapy

## 2015-10-04 ENCOUNTER — Ambulatory Visit: Payer: Medicare Other | Admitting: Physical Therapy

## 2015-10-04 ENCOUNTER — Ambulatory Visit: Payer: Medicare Other | Admitting: Occupational Therapy

## 2015-10-04 ENCOUNTER — Ambulatory Visit: Payer: Medicare Other

## 2015-10-04 DIAGNOSIS — R208 Other disturbances of skin sensation: Secondary | ICD-10-CM | POA: Diagnosis not present

## 2015-10-04 DIAGNOSIS — R2681 Unsteadiness on feet: Secondary | ICD-10-CM

## 2015-10-04 DIAGNOSIS — G8191 Hemiplegia, unspecified affecting right dominant side: Secondary | ICD-10-CM

## 2015-10-04 DIAGNOSIS — R293 Abnormal posture: Secondary | ICD-10-CM

## 2015-10-04 DIAGNOSIS — R471 Dysarthria and anarthria: Secondary | ICD-10-CM

## 2015-10-04 DIAGNOSIS — R2689 Other abnormalities of gait and mobility: Secondary | ICD-10-CM

## 2015-10-04 DIAGNOSIS — M6281 Muscle weakness (generalized): Secondary | ICD-10-CM

## 2015-10-04 DIAGNOSIS — R41841 Cognitive communication deficit: Secondary | ICD-10-CM

## 2015-10-04 DIAGNOSIS — R260 Ataxic gait: Secondary | ICD-10-CM

## 2015-10-04 DIAGNOSIS — R41844 Frontal lobe and executive function deficit: Secondary | ICD-10-CM | POA: Diagnosis not present

## 2015-10-04 NOTE — Patient Instructions (Signed)
  Please complete the assigned speech therapy homework and return it to your next session.  

## 2015-10-04 NOTE — Therapy (Signed)
[facility] [address] [place], [name], [address] Phone: [phone]   Fax:  [phone]  Occupational Therapy Treatment  Patient Details  Name: Roshawn Barbin MRN: [id number] Date of Birth: [birth year] Referring Provider: Dr. Letta Pate  Encounter Date: 10/04/2015      OT End of Session - 10/04/15 1613    Visit Number 12   Number of Visits 16   Date for OT Re-Evaluation 10/16/15   Authorization Type medicare   Authorization Time Period 60 days, Pt will need G code and PN every 10th visit   Authorization - Visit Number 12   Authorization - Number of Visits 20   OT Start Time 1316   OT Stop Time 1359   OT Time Calculation (min) 43 min   Activity Tolerance Patient tolerated treatment well      Past Medical History  Diagnosis Date  . Stroke ([place])   . Hypertension   . Diabetes mellitus without complication ([place])   . Hyperlipemia   . Heart disease     History reviewed. No pertinent past surgical history.  There were no vitals filed for this visit.      Subjective Assessment - 10/04/15 1321    Subjective  I went out and got the mail today for the first time (at the end of the driveway) - it slopes down.     Patient is accompained by: Family member  wife in waiting room   Pertinent History see epic   Patient Stated Goals To get back to normal - my speech, my toothbrush, my R leg   Currently in Pain? No/denies                      OT Treatments/Exercises (OP) - 10/04/15 0001    ADLs   ADL Comments Pt provided with info regarding driving eval. Pt and wife both verbalized understanding.   Neurological Re-education Exercises   Other Exercises 1 Neuro re ed to address dynamic standing balance, balance reactions, standing tolerance via functional tasks. Pt demonstrating improvemement and able to tolerate 30 minutes of standing activity without rest break.  Also addressed functional  ambulation without using cane in order to facilitate improved balance control using straight cane functionally.              Balance Exercises - 10/04/15 1421    Balance Exercises: Standing   Other Standing Exercises ball roll forwards/backwards and laterally for SLS with bil and 1 UE support and minguard A           OT Education - 10/04/15 1611    Education provided Yes   Education Details driver eval info   Person(s) Educated Patient;Spouse   Methods Explanation;Handout   Comprehension Verbalized understanding;Returned demonstration          OT Short Term Goals - 10/04/15 1611    OT SHORT TERM GOAL #1   Title Pt and wife will be mod I with HEP - 09/18/2015   Status Achieved   OT SHORT TERM GOAL #2   Title Pt will be mod I with toilet transfers   Status Achieved   OT SHORT TERM GOAL #3   Title Pt will be mod I with shower transfers   Status Achieved  wife still providing distant supevision   OT SHORT TERM GOAL #4   Title Pt will be able to cut meat, AE prn   Status Achieved   OT SHORT TERM GOAL #5  Title Pt will improve functional reaching/coordination as shown by improving score on box and blocks test by at least 10 bilaterally.   Baseline R-19blocks, L-29 blocks   Status Achieved  R=25, L = 39   OT SHORT TERM GOAL #6   Title Pt will demonstrate improved awareness as evidenced by being able to identify 2 cognitive deficits he is working on   Status Achieved           OT Long Term Goals - 10/04/15 1611    OT LONG TERM GOAL #1   Title Pt and wife will be mod I with upgraded HEP - 10/16/2015   Status On-going   OT LONG TERM GOAL #2   Title Pt will be mod I with simple hot familiar meal prep   Status On-going   OT LONG TERM GOAL #3   Title Pt will need no more than 2 vc's for moderate complex problem sovling within the context of a functional task.   Status On-going   OT LONG TERM GOAL #4   Title Pt will demonstrate improved safety awareness with  functional mobility to reach mod I level   Status On-going   OT LONG TERM GOAL #5   Title Pt will demo improved coordination for ADLs as shown by completing 9-hole peg in 70sec or less with RUE.   Baseline 7min 55.16sec   Status On-going  09/27/2015 1.16 seconds               Plan - 10/04/15 1612    Clinical Impression Statement Pt progressng toward goals. Pt with improving balance during functional tasks.    Rehab Potential Good   Clinical Impairments Affecting Rehab Potential decreaed awareness, cognition;  wife also appears to have poor understanding of pt's cogntive deficits.    OT Frequency 2x / week   OT Duration 8 weeks   OT Treatment/Interventions Self-care/ADL training;Therapeutic exercise;Neuromuscular education;DME and/or AE instruction;Manual Therapy;Therapist, nutritional;Therapeutic activities;Cognitive remediation/compensation;Patient/family education;Balance training;Moist Heat;Ultrasound   Plan balance retraining, NMR for trunk and RUE control, cognition      Patient will benefit from skilled therapeutic intervention in order to improve the following deficits and impairments:  Abnormal gait, Cardiopulmonary status limiting activity, Decreased balance, Decreased cognition, Decreased coordination, Decreased safety awareness, Decreased mobility, Decreased strength, Difficulty walking, Impaired UE functional use, Impaired sensation  Visit Diagnosis: Unsteadiness on feet  Abnormal posture  Muscle weakness (generalized)  Other abnormalities of gait and mobility  Hemiplegia, unspecified affecting right dominant side (HCC)  Frontal lobe and executive function deficit  Other disturbances of skin sensation    Problem List Patient Active Problem List   Diagnosis Date Noted  . Brainstem infarct, acute ([place]) 09/17/2015  . 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 Obesity 09/17/2015  . Ataxia, post-stroke  08/11/2015  . Gait disturbance, 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  . CVA (cerebral infarction) 08/09/2015    Quay Burow, OTR/L 10/04/2015, 4:14 PM  [facility] [address] [place], [name], [address] Phone: [phone]   Fax:  [phone]  Name: [name] MRN: [id number] Date of Birth: 04/12/1946

## 2015-10-04 NOTE — Therapy (Addendum)
[facility] [address] [place], [name], [address] Phone: [phone]   Fax:  [phone]  Speech Language Pathology Treatment  Patient Details  Name: Jose Beck MRN: [id number] Date of Birth: 29-Dec-1945 Referring Provider: Dr, Lupe Carney  Encounter Date: 10/04/2015      End of Session - 10/04/15 1656    Visit Number 10   Number of Visits 17   Date for SLP Re-Evaluation 10/23/15   SLP Start Time 1449   SLP Stop Time  1530   SLP Time Calculation (min) 41 min   Activity Tolerance Patient tolerated treatment well      Past Medical History  Diagnosis Date  . Stroke ([place])   . Hypertension   . Diabetes mellitus without complication ([place])   . Hyperlipemia   . Heart disease     No past surgical history on file.  There were no vitals filed for this visit.             ADULT SLP TREATMENT - 10/04/15 1457    General Information   Behavior/Cognition Alert;Cooperative;Pleasant mood   Treatment Provided   Treatment provided Cognitive-Linquistic   Pain Assessment   Pain Assessment No/denies pain   Cognitive-Linquistic Treatment   Treatment focused on Dysarthria   Skilled Treatment SLP had pt read rhyming sentences, tongue twisters with occasional min A for overarticulation and slowed rate.  Pt req'd rare min-mod A  from SLP for compensations for dysarthria, in structured tasks.   Assessment / Recommendations / Plan   Plan Continue with current plan of care   Progression Toward Goals   Progression toward goals Progressing toward goals            SLP Short Term Goals - 10/04/15 1657    SLP SHORT TERM GOAL #1   Title Pt will perform dysarthria HEP with mod I   Status Not Met   SLP SHORT TERM GOAL #2   Title Pt will utilize compensations for dysarthira during structured tasks with rare min A over two sessions   Baseline one session 09-18-15   Status Achieved   SLP SHORT TERM GOAL #3    Title Pt will demonstrate utterance length of 5-7 words during structured speech/descriptive tasks with rare min A   Status Achieved          SLP Long Term Goals - 10/04/15 1658    SLP LONG TERM GOAL #1   Title Pt will be 95% intellgible during simple conversation of 10 minutes with rare min A, over three sessions   Baseline two sessions 10-01-15   Time 4   Period Weeks   Status Revised   SLP LONG TERM GOAL #2   Title Pt will participate in mildly complex conversation over 12 minutes with less than 3 requests for clarification/repeair communication breakdown over 3 sessions   Time 4   Period Weeks   Status Revised   SLP LONG TERM GOAL #3   Title Pt will utilize compensations for short term memory to recall details, lists, messages with 85% accuracy and rare min A.   Time 4   Period Weeks   Status On-going          Plan - 10/04/15 1657    Clinical Impression Statement Pt's need for verbal cues/instructions for awareness of dysarthric errors and to employ compensations for dysarthria continues. Pt makes notable change when SLP is cueing pt for use of compensations for speech intelligibilty. Continue skilled ST to  maximize intellgibility, awareness and utterance length.   Speech Therapy Frequency 2x / week   Duration 4 weeks   Treatment/Interventions Cognitive reorganization;Internal/external aids;Compensatory strategies;SLP instruction and feedback;Patient/family education;Functional tasks;Language facilitation   Potential to Achieve Goals Good   Potential Considerations Ability to learn/carryover information      Patient will benefit from skilled therapeutic intervention in order to improve the following deficits and impairments:   Dysarthria and anarthria  Cognitive communication deficit      G-Codes - 2015-10-30 1708    Functional Assessment Tool Used NOMS   Functional Limitations Motor speech   Motor Speech Current Status [phone]) At least 20 percent but less than 40  percent impaired, limited or restricted   Motor Speech Goal Status ([id number]) At least 20 percent but less than 40 percent impaired, limited or restricted      Problem List Patient Active Problem List   Diagnosis Date Noted  . Brainstem infarct, acute ([place]) 09/17/2015  . 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 Obesity 09/17/2015  . 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 Gait disturbance, 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  . CVA (cerebral infarction) 08/09/2015    [facility] ,[place], [place]  10/30/15, 5:08 PM  [facility] [address] [place], [name], [address] Phone: [phone]   Fax:  [phone]   Name: Jose Beck MRN: [id number] Date of Birth: 04/19/1946

## 2015-10-04 NOTE — Therapy (Signed)
[facility] [address] [place], [name], [address] Phone: [phone]   Fax:  [phone]  Physical Therapy Treatment  Patient Details  Name: Jose Beck MRN: [id number] Date of Birth: 01/27/1946 Referring Provider: Dr. Alysia Penna  Encounter Date: 10/04/2015      PT End of Session - 10/04/15 1439    Visit Number 9   Number of Visits 16   Date for PT Re-Evaluation 10/16/15   PT Start Time 1401   PT Stop Time 1444   PT Time Calculation (min) 43 min   Equipment Utilized During Treatment Gait belt   Activity Tolerance Patient tolerated treatment well   Behavior During Therapy [facility] for tasks assessed/performed      Past Medical History  Diagnosis Date  . Stroke ([place])   . Hypertension   . Diabetes mellitus without complication ([place])   . Hyperlipemia   . Heart disease     No past surgical history on file.  There were no vitals filed for this visit.      Subjective Assessment - 10/04/15 1402    Subjective "marvelous."   Patient Stated Goals improve function and mobility; wants to go to beach in May with many stairs to navigate   Currently in Pain? No/denies                         [facility] Adult PT Treatment/Exercise - 10/04/15 1416    Ambulation/Gait   Gait Comments amb without device with minguard A: 150' with visual scanning activities; 450' with walking poles for arm swing then without poles with cues for carryover   Knee/Hip Exercises: Aerobic   Stepper Seated level 4.0 x 3 min   Knee/Hip Exercises: Standing   Lateral Step Up Both;10 reps;Hand Hold: 0;Step Height: 4"   Lateral Step Up Limitations onto compliant surface   Forward Step Up Both;10 reps;Hand Hold: 0;Step Height: 4"   Forward Step Up Limitations onto compliant surface             Balance Exercises - 10/04/15 1421    Balance Exercises: Standing   Other Standing Exercises ball roll forwards/backwards  and laterally for SLS with bil and 1 UE support and minguard A             PT Short Term Goals - 10/01/15 1423    PT SHORT TERM GOAL #1   Title verbalize understanding of CVA risk factors/warning signs to decrease risk of reinjury (09/18/15)   Status Achieved   PT SHORT TERM GOAL #2   Title improve BERG balance score to >/= 38/56 for improved balance    Status Achieved   PT SHORT TERM GOAL #3   Title improve timed up and go to < 30 sec with LRAD for improved mobility and function   Status Achieved   PT SHORT TERM GOAL #4   Title ambulate > 250' with LRAD modified independent on indoor/paved outdoor surfaces for improved function   Status Partially Met   PT SHORT TERM GOAL #5   Title negotiate > 8 steps with 1 handrail and supervision for improved function and ability to negotiate stairs at [employer]   Status Achieved           PT Long Term Goals - 08/21/15 0943    PT LONG TERM GOAL #1   Title independent with HEP (10/16/15)   Time 8   Period Weeks   Status New  PT LONG TERM GOAL #2   Title improve BERG balance score to >/= 45/56 for decreased fall risk   PT LONG TERM GOAL #3   Title improve gait velocity to > 2.62 ft/sec for improved function and mobility   PT LONG TERM GOAL #4   Title improve timed up and go with LRAD to < 25 sec for improved function and mobility   PT LONG TERM GOAL #5   Title ambulate > 500' on various indoor/outdoor surfaces with LRAD modified independent for improved function and mobility.               Plan - 10/04/15 1439    Clinical Impression Statement Pt tolerated exercises well; continue to recommend use of SPC at home with practice without device in the clinic.  Continues to need mod cues for RUE awareness.   PT Next Visit Plan g code; progress note, cont gait, balance, coordination   Consulted and Agree with Plan of Care Patient   Family Member Consulted wife      Patient will benefit from skilled therapeutic intervention in  order to improve the following deficits and impairments:     Visit Diagnosis: Unsteadiness on feet  Ataxic gait  Abnormal posture  Muscle weakness (generalized)  Other abnormalities of gait and mobility     Problem List Patient Active Problem List   Diagnosis Date Noted  . Brainstem infarct, acute ([place]) 09/17/2015  . 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 Obesity 09/17/2015  . 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 Gait disturbance, 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  . CVA (cerebral infarction) 08/09/2015   [name], PT, DPT 10/04/2015 2:47 PM  [facility] [address] [place], [name], [address] Phone: [phone]   Fax:  [phone]  Name: Antone Summons MRN: [id number] Date of Birth: 1946-01-20

## 2015-10-04 NOTE — Patient Instructions (Signed)
Local Driver Evaluation Programs: ° °Comprehensive Evaluation: includes clinical and in vehicle behind the wheel testing by OCCUPATIONAL THERAPIST. Programs have varying levels of adaptive controls available for trial.  ° °[facility] °[address] °[place], [place]  [address] °[phone] or [phone] °http://www.driver-rehab.com °Evaluator:  Cyndee Crompton, OT/CDRS/CDI/SCDCM/Low Vision Certification ° °[facility] °[address] °[place], [place] [address] °[phone] °https://www.[facility].org/home/services/rehabilitation.aspx °Evaluators:  Shannon Sheek, OT and Jill Tucker, OT ° °[facility] [place] [facility] (ONLY SERVES VETERANS!!) °[facility] °[address] °[place], [place]  [address] °[phone] °http://www.[place].va.gov/services/Physical_Medicine_Rehabilitation_Services.asp °Evaluators:  Eric Andrews, KT; Heidi Harris, KT;  Gary Whitaker, KT (KT=kiniesotherapist) ° ° °Clinical evaluations only:  Includes clinical testing, refers to other programs or local certified driving instructor for behind the wheel testing. ° °[facility] (outpatient Rehab) °[facility] °[address] °[place], [place] [address] °[phone] for scheduling °http://www.[facility].edu/Outpatient-Rehabilitation/Neurorehabilitation-Therapy.htm °Evaluators:  Kelly Lambeth, OT; Kate Phillips, OT ° °Other area clinical evaluators available upon request including [facility], [facility] and [facility]. ° ° °    Resource List °What is a Driver Evaluation: °Your Road Ahead - A Guide to Comprehensive Driving Evaluations °http://www.thehartford.com/resources/mature-market-excellence/publications-on-aging ° °Association for [employer] - Disability and Driving Fact Sheets °http://www.aded.net/?page=510 ° °Driving after a Brain  Injury: °[employer] °http://www.biausa.org/tbims-abstracts/if-there-is-an-effective-way-to-determine-if-someone-is-ready-to-drive-after-tbi?A=SearchResult&SearchID=9495675&ObjectID=2758842&ObjectType=35 ° °Driving with Adaptive Equipment: °[facility] Process °http://www.driver-rehab.com/adaptive-equipment ° °[employer] °http://www.nmeda.com/ ° ° ° ° ° ° °  °

## 2015-10-09 ENCOUNTER — Ambulatory Visit: Payer: Medicare Other | Admitting: Occupational Therapy

## 2015-10-09 ENCOUNTER — Telehealth: Payer: Self-pay | Admitting: *Deleted

## 2015-10-09 ENCOUNTER — Encounter: Payer: Self-pay | Admitting: Occupational Therapy

## 2015-10-09 DIAGNOSIS — R208 Other disturbances of skin sensation: Secondary | ICD-10-CM | POA: Diagnosis not present

## 2015-10-09 DIAGNOSIS — G8191 Hemiplegia, unspecified affecting right dominant side: Secondary | ICD-10-CM | POA: Diagnosis not present

## 2015-10-09 DIAGNOSIS — R41844 Frontal lobe and executive function deficit: Secondary | ICD-10-CM | POA: Diagnosis not present

## 2015-10-09 DIAGNOSIS — R293 Abnormal posture: Secondary | ICD-10-CM | POA: Diagnosis not present

## 2015-10-09 DIAGNOSIS — R471 Dysarthria and anarthria: Secondary | ICD-10-CM | POA: Diagnosis not present

## 2015-10-09 DIAGNOSIS — R278 Other lack of coordination: Secondary | ICD-10-CM

## 2015-10-09 DIAGNOSIS — R2681 Unsteadiness on feet: Secondary | ICD-10-CM

## 2015-10-09 DIAGNOSIS — R2689 Other abnormalities of gait and mobility: Secondary | ICD-10-CM

## 2015-10-09 DIAGNOSIS — M6281 Muscle weakness (generalized): Secondary | ICD-10-CM

## 2015-10-09 NOTE — Therapy (Signed)
[facility] [address] [place], [name], [address] Phone: [phone]   Fax:  [phone]  Occupational Therapy Treatment  Patient Details  Name: Jose Beck MRN: [id number] Date of Birth: 1945/07/14 Referring Provider: Dr. Letta Pate  Encounter Date: 10/09/2015      OT End of Session - 10/09/15 1648    Visit Number 13   Number of Visits 16   Date for OT Re-Evaluation 10/16/15   Authorization Type medicare   Authorization Time Period 60 days, Pt will need G code and PN every 10th visit   Authorization - Visit Number 13   Authorization - Number of Visits 20   OT Start Time [id number]   OT Stop Time 1445   OT Time Calculation (min) 42 min   Activity Tolerance Patient tolerated treatment well      Past Medical History  Diagnosis Date  . Stroke ([place])   . Hypertension   . Diabetes mellitus without complication ([place])   . Hyperlipemia   . Heart disease     History reviewed. No pertinent past surgical history.  There were no vitals filed for this visit.      Subjective Assessment - 10/09/15 1411    Subjective  I walked laps at home without my cane    Patient is accompained by: Family member  wife   Pertinent History see epic   Patient Stated Goals To get back to normal - my speech, my toothbrush, my R leg   Currently in Pain? No/denies                      OT Treatments/Exercises (OP) - 10/09/15 0001    Neurological Re-education Exercises   Other Exercises 1 Neuro re ed to address functional balance for even surfaces, uneven surfaces, transitionng on different surfaces, stops and starts, turns, head turns and carrying items all without a device.  Pt needed intermittent min a for functional tasks on even surfacea and min a consistently on uneven surfaces.  Pt fatigued after 40 minutes of activity with greater difficulty maintaining balance.                   OT Short Term Goals  - 10/09/15 1646    OT SHORT TERM GOAL #1   Title Pt and wife will be mod I with HEP - 09/18/2015   Status Achieved   OT SHORT TERM GOAL #2   Title Pt will be mod I with toilet transfers   Status Achieved   OT SHORT TERM GOAL #3   Title Pt will be mod I with shower transfers   Status Achieved  wife still providing distant supevision   OT SHORT TERM GOAL #4   Title Pt will be able to cut meat, AE prn   Status Achieved   OT SHORT TERM GOAL #5   Title Pt will improve functional reaching/coordination as shown by improving score on box and blocks test by at least 10 bilaterally.   Baseline R-19blocks, L-29 blocks   Status Achieved  R=25, L = 39   OT SHORT TERM GOAL #6   Title Pt will demonstrate improved awareness as evidenced by being able to identify 2 cognitive deficits he is working on   Status Achieved           OT Long Term Goals - 10/09/15 1646    OT [place] #1   Title Pt and wife will be mod I  with upgraded HEP - 10/16/2015   Status On-going   OT LONG TERM GOAL #2   Title Pt will be mod I with simple hot familiar meal prep   Status On-going   OT LONG TERM GOAL #3   Title Pt will need no more than 2 vc's for moderate complex problem sovling within the context of a functional task.   Status On-going   OT LONG TERM GOAL #4   Title Pt will demonstrate improved safety awareness with functional mobility to reach mod I level   Status On-going   OT LONG TERM GOAL #5   Title Pt will demo improved coordination for ADLs as shown by completing 9-hole peg in 70sec or less with RUE.   Baseline 41min 55.16sec   Status On-going  09/27/2015 1.16 seconds               Plan - 10/09/15 1646    Clinical Impression Statement Pt progressing toward goals. Pt with improving balance and safety awareness during functional tasks.   Rehab Potential Good   Clinical Impairments Affecting Rehab Potential decreaed awareness, cognition;  wife also appears to have poor understanding of  pt's cogntive deficits.    OT Frequency 2x / week   OT Duration 8 weeks   OT Treatment/Interventions Self-care/ADL training;Therapeutic exercise;Neuromuscular education;DME and/or AE instruction;Manual Therapy;Therapist, nutritional;Therapeutic activities;Cognitive remediation/compensation;Patient/family education;Balance training;Moist Heat;Ultrasound   Plan simple hot meal prep at ambuliatory level without a device, balance retraining, RUE control, cognition   Consulted and Agree with Plan of Care Patient;Family member/caregiver   Family Member Consulted wife      Patient will benefit from skilled therapeutic intervention in order to improve the following deficits and impairments:  Abnormal gait, Cardiopulmonary status limiting activity, Decreased balance, Decreased cognition, Decreased coordination, Decreased safety awareness, Decreased mobility, Decreased strength, Difficulty walking, Impaired UE functional use, Impaired sensation  Visit Diagnosis: Unsteadiness on feet  Abnormal posture  Muscle weakness (generalized)  Other abnormalities of gait and mobility  Hemiplegia, unspecified affecting right dominant side (HCC)  Frontal lobe and executive function deficit  Other disturbances of skin sensation  Other lack of coordination    Problem List Patient Active Problem List   Diagnosis Date Noted  . Brainstem infarct, acute ([place]) 09/17/2015  . 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 Obesity 09/17/2015  . 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 Gait disturbance, 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  . CVA (cerebral infarction) 08/09/2015    Quay Burow, OTR/L 10/09/2015, 4:50 PM  [facility] [address] [place], [name], [address] Phone: [phone]   Fax:  [phone]  Name: Jose Beck MRN: [id number] Date of Birth: August 23, 1945

## 2015-10-10 ENCOUNTER — Encounter: Payer: Self-pay | Admitting: Physical Therapy

## 2015-10-10 ENCOUNTER — Ambulatory Visit: Payer: Medicare Other | Admitting: Physical Therapy

## 2015-10-10 ENCOUNTER — Ambulatory Visit: Payer: Medicare Other

## 2015-10-10 VITALS — HR 95

## 2015-10-10 DIAGNOSIS — R2689 Other abnormalities of gait and mobility: Secondary | ICD-10-CM

## 2015-10-10 DIAGNOSIS — M6281 Muscle weakness (generalized): Secondary | ICD-10-CM

## 2015-10-10 DIAGNOSIS — R208 Other disturbances of skin sensation: Secondary | ICD-10-CM | POA: Diagnosis not present

## 2015-10-10 DIAGNOSIS — R471 Dysarthria and anarthria: Secondary | ICD-10-CM

## 2015-10-10 DIAGNOSIS — G8191 Hemiplegia, unspecified affecting right dominant side: Secondary | ICD-10-CM | POA: Diagnosis not present

## 2015-10-10 DIAGNOSIS — R2681 Unsteadiness on feet: Secondary | ICD-10-CM

## 2015-10-10 DIAGNOSIS — R293 Abnormal posture: Secondary | ICD-10-CM

## 2015-10-10 DIAGNOSIS — R41841 Cognitive communication deficit: Secondary | ICD-10-CM

## 2015-10-10 DIAGNOSIS — R41844 Frontal lobe and executive function deficit: Secondary | ICD-10-CM | POA: Diagnosis not present

## 2015-10-10 NOTE — Patient Instructions (Signed)
At counter with support for balance and with spouse with you: Perform each of these for 1-2 laps, resting between. 1-2 times a day.   "I love a Parade" Lift    High knee marching (slowly) forward and then backwards along counter top.  http://gt2.exer.us/344   Copyright  VHI. All rights reserved.  Feet Heel-Toe "Tandem"    Arms as needed for balance on counter top: walk a straight line forward and then walking in a straight line backwards along the counter top.  Copyright  VHI. All rights reserved.  Walking on Toes    Arms on counter for support: walk forward on your toes, keeping heels up as high as possible. Walk backwards normally.  Walking on Heels    Walk on heels continuing on a straight path. Turn around and go forward, NOT BACKWARDS Do __1_ sessions per day.  Copyright  VHI. All rights reserved.

## 2015-10-10 NOTE — Therapy (Signed)
[facility] [address] [place], [name], [address] Phone: [phone]   Fax:  [phone]  Physical Therapy Treatment  Patient Details  Name: Jose Beck MRN: [id number] Date of Birth: 17-Apr-1946 Referring Provider: Dr. Alysia Penna  Encounter Date: 10/10/2015      PT End of Session - 10/10/15 1410    Visit Number 10   Number of Visits 16   Date for PT Re-Evaluation 10/16/15   PT Start Time 3220   PT Stop Time 1400   PT Time Calculation (min) 38 min   Activity Tolerance Patient tolerated treatment well      Past Medical History  Diagnosis Date  . Stroke ([place])   . Hypertension   . Diabetes mellitus without complication ([place])   . Hyperlipemia   . Heart disease     History reviewed. No pertinent past surgical history.  Filed Vitals:   10/10/15 1337  Pulse: 95 After Sci Fit exercise.  SpO2: 95%        Subjective Assessment - 10/10/15 1328    Subjective Nothing new to report; Has been walking outside for exercise.   Currently in Pain? No/denies                         [facility] Adult PT Treatment/Exercise - 10/10/15 0001    Knee/Hip Exercises: Aerobic   Stepper Seated level 4.0 x 17mn  rest break at 78m due to fatigue.             Balance Exercises - 10/10/15 1410    Balance Exercises: Standing   Other Standing Exercises Reviewed and performed balance exercises from HEP given 09/03/11 adding heel walking.  Pt required intermittent UE support, supervision and cues for technique. see handout below.           PT Education - 10/10/15 1406    Education provided Yes   Education Details Reveiwed balance exercise from HEP given 09/03/15.  Pt has not been doing balance exercises. Added Heel walking.  Recommend pt progress cardio exercise with seated exercise machine that is available to him at gym/[employer]   Person(s) Educated Patient;Spouse   Methods  Explanation;Demonstration;Verbal cues;Handout   Comprehension Verbalized understanding;Need further instruction          PT Short Term Goals - 10/01/15 1423    PT SHORT TERM GOAL #1   Title verbalize understanding of CVA risk factors/warning signs to decrease risk of reinjury (09/18/15)   Status Achieved   PT SHORT TERM GOAL #2   Title improve BERG balance score to >/= 38/56 for improved balance    Status Achieved   PT SHORT TERM GOAL #3   Title improve timed up and go to < 30 sec with LRAD for improved mobility and function   Status Achieved   PT SHORT TERM GOAL #4   Title ambulate > 250' with LRAD modified independent on indoor/paved outdoor surfaces for improved function   Status Partially Met   PT SHORT TERM GOAL #5   Title negotiate > 8 steps with 1 handrail and supervision for improved function and ability to negotiate stairs at [employer] Status Achieved           PT Long Term Goals - 08/21/15 0943    PT LONG TERM GOAL #1   Title independent with HEP (10/16/15)   Time 8   Period Weeks   Status New   PT LONG TERM GOAL #  2   Title improve BERG balance score to >/= 45/56 for decreased fall risk   PT LONG TERM GOAL #3   Title improve gait velocity to > 2.62 ft/sec for improved function and mobility   PT LONG TERM GOAL #4   Title improve timed up and go with LRAD to < 25 sec for improved function and mobility   PT LONG TERM GOAL #5   Title ambulate > 500' on various indoor/outdoor surfaces with LRAD modified independent for improved function and mobility.               Plan - October 19, 2015 1412    Clinical Impression Statement Pt demonstrates poor activity tolerance needing rest breaks with standing actvities and seated Scifit.  Pt continues to need intermittent UE support for dynamic standing balance with narrow BOS.   Rehab Potential Good   PT Frequency 2x / week   PT Duration 8 weeks   PT Treatment/Interventions ADLs/Self Care Home Management;Electrical  Stimulation;Patient/family education;Neuromuscular re-education;Balance training;Therapeutic exercise;Therapeutic activities;Functional mobility training;Stair training;Gait training;Vestibular   PT Next Visit Plan cont gait, balance, coordination   Consulted and Agree with Plan of Care Patient   Family Member Consulted wife      Patient will benefit from skilled therapeutic intervention in order to improve the following deficits and impairments:  Abnormal gait, Decreased balance, Decreased coordination, Decreased mobility, Postural dysfunction, Decreased strength, Decreased activity tolerance  Visit Diagnosis: Unsteadiness on feet  Abnormal posture  Muscle weakness (generalized)  Other abnormalities of gait and mobility     Problem List Patient Active Problem List   Diagnosis Date Noted  . Brainstem infarct, acute ([place]) 09/17/2015  . 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 Obesity 09/17/2015  . 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 Gait disturbance, 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  . CVA (cerebral infarction) 08/09/2015    Bjorn Loser, PTA  Oct 19, 2015, 2:18 PM [facility] [address] [place], [name], [address] Phone: [phone]   Fax:  [phone]  Name: Jose Beck MRN: [id number] Date of Birth: [birth year]        G-Codes - October 19, 2015 1647    Functional Assessment Tool Used BERG 44/56   Functional Limitation Mobility: Walking and moving around   Mobility: Walking and Moving Around Current Status [phone]) At least 20 percent but less than 40 percent impaired, limited or restricted   Mobility: Walking and Moving Around Goal Status [phone]) At least 1 percent but less than 20 percent impaired, limited or restricted     Physical Therapy Progress Note  Dates of Reporting Period: 08/21/15  to 19-Oct-2015  Objective Reports of Subjective Statement: Pt has reported he  is walking outside, but endurance is still decreased as he requires rest breaks.  Objective Measurements: BERG: 44/56  Goal Update:      PT Short Term Goals - 10/01/15 1423    PT SHORT TERM GOAL #1   Title verbalize understanding of CVA risk factors/warning signs to decrease risk of reinjury (09/18/15)   Status Achieved   PT SHORT TERM GOAL #2   Title improve BERG balance score to >/= 38/56 for improved balance    Status Achieved   PT SHORT TERM GOAL #3   Title improve timed up and go to < 30 sec with LRAD for improved mobility and function   Status Achieved   PT SHORT TERM GOAL #4   Title ambulate > 250' with LRAD modified independent on indoor/paved outdoor surfaces  for improved function   Status Partially Met   PT SHORT TERM GOAL #5   Title negotiate > 8 steps with 1 handrail and supervision for improved function and ability to negotiate stairs at [place] house   Status Achieved        Plan: Continue to address impairments listed above.  Reason Skilled Services are Required: To improve safety during functional mobility.     G-code and progress note completed by another PT, as primary PT not available. Geoffry Paradise, PT,DPT 10/10/2015 4:48 PM Phone: [phone] Fax: [phone]

## 2015-10-10 NOTE — Telephone Encounter (Signed)
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 Rn stated a referral can be done after therapy and per Dr. Leonie Man recommendations.

## 2015-10-10 NOTE — Therapy (Signed)
[facility] [address] [place], [name], [address] Phone: [phone]   Fax:  [phone]  Speech Language Pathology Treatment  Patient Details  Name: Jose Beck MRN: [id number] Date of Birth: 02-Oct-1945 Referring Provider: Dr, Lupe Carney  Encounter Date: 10/10/2015      End of Session - 10/10/15 1657    Visit Number 11   Number of Visits 17   Date for SLP Re-Evaluation 10/23/15   SLP Start Time 1404   SLP Stop Time  1446   SLP Time Calculation (min) 42 min   Activity Tolerance Patient tolerated treatment well      Past Medical History  Diagnosis Date  . Stroke ([place])   . Hypertension   . Diabetes mellitus without complication ([place])   . Hyperlipemia   . Heart disease     No past surgical history on file.  There were no vitals filed for this visit.      Subjective Assessment - 10/10/15 1410    Subjective (wife) "I have to brag on him - he did those things every morning."   Patient is accompained by: Family member  Wife   Currently in Pain? No/denies               ADULT SLP TREATMENT - 10/10/15 1411    General Information   Behavior/Cognition Alert;Cooperative;Pleasant mood   Treatment Provided   Treatment provided Cognitive-Linquistic   Cognitive-Linquistic Treatment   Treatment focused on Dysarthria   Skilled Treatment Pt with simple conversation approaching mod complex conversation for 20 minutes with rare request for SLP for pt to repeat message.When asked, pt improved articulation. Intelligibility at 95%. Min-mod A for rate reduction rarely. Pt reported (again) he did not do the stimuli that he has not completed in the past (building a birdhouse, e.g.) . SLP encouraged pt to complete ALL stimuli as it is a good practice for times when he may speak of things he is less familar with.   Assessment / Recommendations / Plan   Plan Continue with current plan of care   Progression Toward Goals   Progression toward goals Progressing toward goals            SLP Short Term Goals - 10/04/15 1657    SLP SHORT TERM GOAL #1   Title Pt will perform dysarthria HEP with mod I   Status Not Met   SLP SHORT TERM GOAL #2   Title Pt will utilize compensations for dysarthira during structured tasks with rare min A over two sessions   Baseline one session 09-18-15   Status Achieved   SLP SHORT TERM GOAL #3   Title Pt will demonstrate utterance length of 5-7 words during structured speech/descriptive tasks with rare min A   Status Achieved          SLP Long Term Goals - 10/10/15 1659    SLP LONG TERM GOAL #1   Title Pt will be 95% intellgible during simple conversation of 10 minutes with rare min A, over three sessions   Status Achieved   SLP LONG TERM GOAL #2   Title Pt will participate in mildly complex conversation over 12 minutes with less than 3 requests for clarification/repeair communication breakdown over 3 sessions   Time 3   Period Weeks   Status Revised   SLP LONG TERM GOAL #3   Title Pt will utilize compensations for short term memory to recall details, lists, messages with 85% accuracy and rare  min A.   Time 3   Period Weeks   Status On-going          Plan - 10/10/15 1658    Clinical Impression Statement Pt's need for verbal cues/instructions to employ compensations for dysarthria is less than last week. Continue skilled ST to maximize intellgibility, awareness and utterance length.   Speech Therapy Frequency 2x / week   Duration --  3 weeks   Treatment/Interventions Cognitive reorganization;Internal/external aids;Compensatory strategies;SLP instruction and feedback;Patient/family education;Functional tasks;Language facilitation   Potential to Achieve Goals Good   Potential Considerations Ability to learn/carryover information      Patient will benefit from skilled therapeutic intervention in order to improve the following deficits  and impairments:   Dysarthria and anarthria  Cognitive communication deficit    Problem List Patient Active Problem List   Diagnosis Date Noted  . Brainstem infarct, acute ([place]) 09/17/2015  . 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 Obesity 09/17/2015  . 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 Gait disturbance, 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  . CVA (cerebral infarction) 08/09/2015    [facility] ,[place], [place]  10/10/2015, 5:00 PM  [facility] [address] [place], [name], [address] Phone: [phone]   Fax:  [phone]   Name: Jose Beck MRN: [id number] Date of Birth: [birth year]

## 2015-10-10 NOTE — Telephone Encounter (Signed)
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

## 2015-10-11 ENCOUNTER — Ambulatory Visit: Payer: Medicare Other | Admitting: Physical Therapy

## 2015-10-11 ENCOUNTER — Ambulatory Visit: Payer: Medicare Other

## 2015-10-11 ENCOUNTER — Encounter: Payer: Self-pay | Admitting: Occupational Therapy

## 2015-10-11 ENCOUNTER — Encounter: Payer: Self-pay | Admitting: Physical Therapy

## 2015-10-11 ENCOUNTER — Ambulatory Visit: Payer: Medicare Other | Attending: Physical Medicine & Rehabilitation | Admitting: Occupational Therapy

## 2015-10-11 DIAGNOSIS — M6281 Muscle weakness (generalized): Secondary | ICD-10-CM | POA: Diagnosis not present

## 2015-10-11 DIAGNOSIS — R262 Difficulty in walking, not elsewhere classified: Secondary | ICD-10-CM | POA: Insufficient documentation

## 2015-10-11 DIAGNOSIS — R41844 Frontal lobe and executive function deficit: Secondary | ICD-10-CM | POA: Diagnosis not present

## 2015-10-11 DIAGNOSIS — R278 Other lack of coordination: Secondary | ICD-10-CM

## 2015-10-11 DIAGNOSIS — R2689 Other abnormalities of gait and mobility: Secondary | ICD-10-CM | POA: Diagnosis not present

## 2015-10-11 DIAGNOSIS — G8191 Hemiplegia, unspecified affecting right dominant side: Secondary | ICD-10-CM

## 2015-10-11 DIAGNOSIS — R41841 Cognitive communication deficit: Secondary | ICD-10-CM | POA: Diagnosis not present

## 2015-10-11 DIAGNOSIS — R471 Dysarthria and anarthria: Secondary | ICD-10-CM | POA: Insufficient documentation

## 2015-10-11 DIAGNOSIS — R208 Other disturbances of skin sensation: Secondary | ICD-10-CM | POA: Diagnosis not present

## 2015-10-11 DIAGNOSIS — R2681 Unsteadiness on feet: Secondary | ICD-10-CM

## 2015-10-11 DIAGNOSIS — R260 Ataxic gait: Secondary | ICD-10-CM | POA: Insufficient documentation

## 2015-10-11 DIAGNOSIS — R293 Abnormal posture: Secondary | ICD-10-CM

## 2015-10-11 NOTE — Therapy (Signed)
[facility] [address] [place], [name], [address] Phone: [phone]   Fax:  [phone]  Occupational Therapy Treatment  Patient Details  Name: Jose Beck MRN: [id number]:[id number] Date of Birth: 11/29/1945 Referring Provider: Dr. Letta Pate  Encounter Date: 10/11/2015      OT End of Session - 10/11/15 1716    Visit Number 14   Number of Visits 16   Date for OT Re-Evaluation 10/16/15   Authorization Type medicare   Authorization Time Period 60 days, Pt will need G code and PN every 10th visit   Authorization - Visit Number 14   Authorization - Number of Visits 20   OT Start Time 1315   OT Stop Time 1359   OT Time Calculation (min) 44 min   Activity Tolerance Patient tolerated treatment well      Past Medical History  Diagnosis Date  . Stroke ([place])   . Hypertension   . Diabetes mellitus without complication ([place])   . Hyperlipemia   . Heart disease     History reviewed. No pertinent past surgical history.  There were no vitals filed for this visit.      Subjective Assessment - 10/11/15 1323    Subjective  I keep forgetting to relax my right arm when I walk.   Patient is accompained by: Family member  wife   Patient Stated Goals To get back to normal - my speech, my toothbrush, my R leg   Currently in Pain? No/denies                      OT Treatments/Exercises (OP) - 10/11/15 0001    ADLs   Cooking Addressed simple hot meal prep with emphasis on dynamic standing balance, functional ambulation without a device, increased functional use of RUE with encouragement to use as dominant when possible, activity tolerance and safety. Pt with signficant improvement in all areas with this activity and only required min vc's and supervision.    Writing Practiced writing using built up pen - pt with improved legibility with printing and cues to print large for name only (language issues impede  further writing). Also practiced pre writing tracing skills with marker and pt given sheets to work on at home.              Balance Exercises - 10/11/15 1510    Balance Exercises: Standing   Tandem Stance Eyes open;3 reps;30 secs   SLS Eyes open;3 reps;10 secs             OT Short Term Goals - 10/11/15 1713    OT SHORT TERM GOAL #1   Title Pt and wife will be mod I with HEP - 09/18/2015   Status Achieved   OT SHORT TERM GOAL #2   Title Pt will be mod I with toilet transfers   Status Achieved   OT SHORT TERM GOAL #3   Title Pt will be mod I with shower transfers   Status Achieved  wife still providing distant supevision   OT SHORT TERM GOAL #4   Title Pt will be able to cut meat, AE prn   Status Achieved   OT SHORT TERM GOAL #5   Title Pt will improve functional reaching/coordination as shown by improving score on box and blocks test by at least 10 bilaterally.   Baseline R-19blocks, L-29 blocks   Status Achieved  R=25, L = 39   OT SHORT TERM GOAL #6  Title Pt will demonstrate improved awareness as evidenced by being able to identify 2 cognitive deficits he is working on   Status Achieved           OT Long Term Goals - 10/11/15 1713    [place] #1   Title Pt and wife will be mod I with upgraded HEP - 10/19/2015 (date adjusted to accomodate missed appt and still maintain 60 day certificaiton)   Status On-going   OT LONG TERM GOAL #2   Title Pt will be mod I with simple hot familiar meal prep   Status On-going   OT LONG TERM GOAL #3   Title Pt will need no more than 2 vc's for moderate complex problem sovling within the context of a functional task.   Status Achieved   OT LONG TERM GOAL #4   Title Pt will demonstrate improved safety awareness with functional mobility to reach mod I level   Status Achieved   OT LONG TERM GOAL #5   Title Pt will demo improved coordination for ADLs as shown by completing 9-hole peg in 70sec or less with RUE.   Baseline  28min 55.16sec   Status On-going  09/27/2015 1.16 seconds               Plan - 10/11/15 1714    Clinical Impression Statement Pt progressing toward goals. Pt with improved use of RUE during functonal tasks as well as improved balance.    Rehab Potential Good   Clinical Impairments Affecting Rehab Potential decreaed awareness, cognition;  wife also appears to have poor understanding of pt's cogntive deficits.    OT Frequency 2x / week   OT Duration 8 weeks   OT Treatment/Interventions Self-care/ADL training;Therapeutic exercise;Neuromuscular education;DME and/or AE instruction;Manual Therapy;Therapist, nutritional;Therapeutic activities;Cognitive remediation/compensation;Patient/family education;Balance training;Moist Heat;Ultrasound   Plan simple hot meal prep, functional ambulation during tasks without a device, functional use of RUE in bilateral tasks.   Consulted and Agree with Plan of Care Patient;Family member/caregiver   Family Member Consulted wife      Patient will benefit from skilled therapeutic intervention in order to improve the following deficits and impairments:  Abnormal gait, Cardiopulmonary status limiting activity, Decreased balance, Decreased cognition, Decreased coordination, Decreased safety awareness, Decreased mobility, Decreased strength, Difficulty walking, Impaired UE functional use, Impaired sensation  Visit Diagnosis: Unsteadiness on feet  Abnormal posture  Muscle weakness (generalized)  Other abnormalities of gait and mobility  Hemiplegia, unspecified affecting right dominant side (HCC)  Frontal lobe and executive function deficit  Other disturbances of skin sensation  Other lack of coordination    Problem List Patient Active Problem List   Diagnosis Date Noted  . Brainstem infarct, acute ([place]) 09/17/2015  . 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 Obesity 09/17/2015  . Ataxia,  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  . Gait disturbance, 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  . CVA (cerebral infarction) 08/09/2015    Quay Burow, OTR/L 10/11/2015, 5:20 PM  [facility] [address] [place], [name], [address] Phone: [phone]   Fax:  [phone]  Name: Jose Beck MRN: [place]:[id number] Date of Birth: 07/09/1945

## 2015-10-11 NOTE — Therapy (Signed)
[facility] [address] [place], [name], [address] Phone: [phone]   Fax:  [phone]  Speech Language Pathology Treatment  Patient Details  Name: Jose Beck MRN: [id number] Date of Birth: 1945/07/16 Referring Provider: Dr, Lupe Carney  Encounter Date: 10/11/2015      End of Session - 10/11/15 1630    Visit Number 12   Number of Visits 17   Date for SLP Re-Evaluation 10/23/15   SLP Start Time 9977   SLP Stop Time  1445   SLP Time Calculation (min) 42 min   Activity Tolerance Patient tolerated treatment well      Past Medical History  Diagnosis Date  . Stroke ([place])   . Hypertension   . Diabetes mellitus without complication ([place])   . Hyperlipemia   . Heart disease     No past surgical history on file.  There were no vitals filed for this visit.      Subjective Assessment - 10/10/15 1410    Subjective (wife) "I have to brag on him - he did those things every morning."   Patient is accompained by: Family member  Wife   Currently in Pain? No/denies               ADULT SLP TREATMENT - 10/11/15 1409    General Information   Behavior/Cognition Alert;Cooperative;Pleasant mood   Treatment Provided   Treatment provided Cognitive-Linquistic   Cognitive-Linquistic Treatment   Treatment focused on Dysarthria   Skilled Treatment (cognition 10 minutes) Pt reports that if he needs to remember something he tells his wife to put it on the calendar. Pt recalled to complete random task at appropriate times however req'd min-mod cues each time for details of request. (speech tx:30 minutes) Pt provided 4-5 sentence responses in structured tasks with usual cues for overarticulation. Spoke with pt's wife after session re: pt's homework and necessity to cont to complete as prescribed.   Assessment / Recommendations / Plan   Plan Continue with current plan of care   Progression Toward Goals   Progression toward goals Progressing toward goals          SLP Education - 10/11/15 1629    Education provided Yes   Education Details memory compensations - ask wife to write down important info pt may want to review later - pt need to recall details, from performance today   Person(s) Educated Patient   Methods Explanation   Comprehension Verbalized understanding          SLP Short Term Goals - 10/04/15 1657    SLP SHORT TERM GOAL #1   Title Pt will perform dysarthria HEP with mod I   Status Not Met   SLP SHORT TERM GOAL #2   Title Pt will utilize compensations for dysarthira during structured tasks with rare min A over two sessions   Baseline one session 09-18-15   Status Achieved   SLP SHORT TERM GOAL #3   Title Pt will demonstrate utterance length of 5-7 words during structured speech/descriptive tasks with rare min A   Status Achieved          SLP Long Term Goals - 10/11/15 1409    SLP LONG TERM GOAL #1   Title Pt will be 95% intellgible during simple conversation of 10 minutes with rare min A, over three sessions   Status Achieved   SLP LONG TERM GOAL #2   Title Pt will participate in mildly complex conversation over 12  minutes with less than 3 requests for clarification/repeair communication breakdown over 3 sessions   Time 3   Period Weeks   Status Revised   SLP LONG TERM GOAL #3   Title Pt will utilize compensations for short term memory to recall details, lists, messages with 85% accuracy and rare min A.   Time 3   Period Weeks   Status On-going          Plan - 10/11/15 1630    Clinical Impression Statement Pt's need for verbal cues/instructions to employ compensations for dysarthria today was similar to last week. Continue skilled ST to maximize intellgibility, awareness and utterance length, and to carryover memory strategies to community/home PRN.Marland Kitchen   Speech Therapy Frequency 2x / week   Duration --  3 weeks   Treatment/Interventions Cognitive  reorganization;Internal/external aids;Compensatory strategies;SLP instruction and feedback;Patient/family education;Functional tasks;Language facilitation   Potential to Achieve Goals Good   Potential Considerations Ability to learn/carryover information      Patient will benefit from skilled therapeutic intervention in order to improve the following deficits and impairments:   Dysarthria and anarthria  Cognitive communication deficit    Problem List Patient Active Problem List   Diagnosis Date Noted  . Brainstem infarct, acute ([place]) 09/17/2015  . 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 Obesity 09/17/2015  . 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 Gait disturbance, 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  . CVA (cerebral infarction) 08/09/2015    [facility] ,[place], [place]   10/11/2015, 4:32 PM  [facility] [address] [place], [name], [address] Phone: [phone]   Fax:  [phone]   Name: Sascha Baugher MRN: [id number] Date of Birth: 04-07-1946

## 2015-10-11 NOTE — Therapy (Signed)
[facility] [address] [place], [name], [address] Phone: [phone]   Fax:  [phone]  Physical Therapy Treatment  Patient Details  Name: Jose Beck MRN: [id number] Date of Birth: 1946-04-20 Referring Provider: Dr. Alysia Penna  Encounter Date: 10/11/2015      PT End of Session - 10/11/15 1543    Visit Number 11   Number of Visits 16   Date for PT Re-Evaluation 10/16/15   PT Start Time 1448   PT Stop Time 1528   PT Time Calculation (min) 40 min   Equipment Utilized During Treatment Gait belt   Activity Tolerance Patient tolerated treatment well   Behavior During Therapy [facility] for tasks assessed/performed      Past Medical History  Diagnosis Date  . Stroke ([place])   . Hypertension   . Diabetes mellitus without complication ([place])   . Hyperlipemia   . Heart disease     History reviewed. No pertinent past surgical history.  There were no vitals filed for this visit.      Subjective Assessment - 10/11/15 1450    Subjective Nothing new to report.   Currently in Pain? No/denies                         [facility] Adult PT Treatment/Exercise - 10/11/15 0001    Ambulation/Gait   Ambulation/Gait Yes   Ambulation/Gait Assistance 5: Supervision;4: Min guard   Ambulation/Gait Assistance Details Working on balance with multi-level surfaces and multitasking.   Ambulation Distance (Feet) 250 Feet  500   Assistive device Non   Gait Pattern Step-through pattern;Poor foot clearance - right;Decreased arm swing - right   Ambulation Surface Level;Unlevel;Indoor;Outdoor;Paved;Gravel;Grass   Stairs Yes   Stairs Assistance 5: Supervision   Stairs Assistance Details (indicate cue type and reason) worked on balance with reciprocal pattern   Stair Management Technique One rail Right   Number of Stairs 16   Gait Comments gait without AD: requires Min Guard on uneven surfaces and with multitasking;  supervision on level surface; cues for increasing step length and foot clearance.             Balance Exercises - 10/11/15 1510    Balance Exercises: Standing   Tandem Stance Eyes open;3 reps;30 secs; intermittent UE support   SLS Eyes open;3 reps;10 secs;   intermittent UE support           PT Education - 10/11/15 1541    Education provided Yes   Education Details Explained increased difficulty maintaining balance with possible larger perturbations in community and insufficient balance reactions without an AD.   Person(s) Educated Patient;Spouse   Methods Explanation   Comprehension Verbalized understanding;Need further instruction          PT Short Term Goals - 10/01/15 1423    PT SHORT TERM GOAL #1   Title verbalize understanding of CVA risk factors/warning signs to decrease risk of reinjury (09/18/15)   Status Achieved   PT SHORT TERM GOAL #2   Title improve BERG balance score to >/= 38/56 for improved balance    Status Achieved   PT SHORT TERM GOAL #3   Title improve timed up and go to < 30 sec with LRAD for improved mobility and function   Status Achieved   PT SHORT TERM GOAL #4   Title ambulate > 250' with LRAD modified independent on indoor/paved outdoor surfaces for improved function   Status Partially Met  PT SHORT TERM GOAL #5   Title negotiate > 8 steps with 1 handrail and supervision for improved function and ability to [place]   Status Achieved           PT Long Term Goals - 08/21/15 0943    PT LONG TERM GOAL #1   Title independent with HEP (10/16/15)   Time 8   Period Weeks   Status New   PT LONG TERM GOAL #2   Title improve BERG balance score to >/= 45/56 for decreased fall risk   PT LONG TERM GOAL #3   Title improve gait velocity to > 2.62 ft/sec for improved function and mobility   PT LONG TERM GOAL #4   Title improve timed up and go with LRAD to < 25 sec for improved function and mobility   PT LONG TERM GOAL #5    Title ambulate > 500' on various indoor/outdoor surfaces with LRAD modified independent for improved function and mobility.               Plan - 10/11/15 1541    Clinical Impression Statement Progressing with gait without AD: requires Min Guard on uneven surfaces and with multitasking; supervision on level surface.   Rehab Potential Good   PT Frequency 2x / week   PT Duration 8 weeks   PT Treatment/Interventions ADLs/Self Care Home Management;Electrical Stimulation;Patient/family education;Neuromuscular re-education;Balance training;Therapeutic exercise;Therapeutic activities;Functional mobility training;Stair training;Gait training;Vestibular   PT Next Visit Plan cont gait, balance, coordination   Consulted and Agree with Plan of Care Patient   Family Member Consulted wife      Patient will benefit from skilled therapeutic intervention in order to improve the following deficits and impairments:  Abnormal gait, Decreased balance, Decreased coordination, Decreased mobility, Postural dysfunction, Decreased strength, Decreased activity tolerance  Visit Diagnosis: Unsteadiness on feet  Muscle weakness (generalized)  Other abnormalities of gait and mobility       G-Codes - 10/29/2015 1647    Functional Assessment Tool Used BERG 44/56   Functional Limitation Mobility: Walking and moving around   Mobility: Walking and Moving Around Current Status (I0165) At least 20 percent but less than 40 percent impaired, limited or restricted   Mobility: Walking and Moving Around Goal Status (V3748) At least 1 percent but less than 20 percent impaired, limited or restricted      Problem List Patient Active Problem List   Diagnosis Date Noted  . Brainstem infarct, acute ([place]) 09/17/2015  . 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 Obesity 09/17/2015  . 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 Gait disturbance, 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  . CVA (cerebral  infarction) 08/09/2015   Bjorn Loser, PTA  10/11/2015, 3:48 PM [facility] [address] [place], [name], [address] Phone: [phone]   Fax:  [phone]  Name: Damar Petit MRN: [id number] Date of Birth: [birth year]

## 2015-10-15 ENCOUNTER — Ambulatory Visit: Payer: Medicare Other

## 2015-10-15 ENCOUNTER — Ambulatory Visit: Payer: Medicare Other | Admitting: Physical Therapy

## 2015-10-15 ENCOUNTER — Encounter: Payer: Self-pay | Admitting: Occupational Therapy

## 2015-10-15 ENCOUNTER — Ambulatory Visit: Payer: Medicare Other | Admitting: Occupational Therapy

## 2015-10-15 DIAGNOSIS — R2689 Other abnormalities of gait and mobility: Secondary | ICD-10-CM | POA: Diagnosis not present

## 2015-10-15 DIAGNOSIS — R41844 Frontal lobe and executive function deficit: Secondary | ICD-10-CM | POA: Diagnosis not present

## 2015-10-15 DIAGNOSIS — M6281 Muscle weakness (generalized): Secondary | ICD-10-CM | POA: Diagnosis not present

## 2015-10-15 DIAGNOSIS — R293 Abnormal posture: Secondary | ICD-10-CM

## 2015-10-15 DIAGNOSIS — R278 Other lack of coordination: Secondary | ICD-10-CM

## 2015-10-15 DIAGNOSIS — G8191 Hemiplegia, unspecified affecting right dominant side: Secondary | ICD-10-CM | POA: Diagnosis not present

## 2015-10-15 DIAGNOSIS — R41841 Cognitive communication deficit: Secondary | ICD-10-CM

## 2015-10-15 DIAGNOSIS — R262 Difficulty in walking, not elsewhere classified: Secondary | ICD-10-CM

## 2015-10-15 DIAGNOSIS — R208 Other disturbances of skin sensation: Secondary | ICD-10-CM

## 2015-10-15 DIAGNOSIS — R2681 Unsteadiness on feet: Secondary | ICD-10-CM | POA: Diagnosis not present

## 2015-10-15 DIAGNOSIS — R471 Dysarthria and anarthria: Secondary | ICD-10-CM

## 2015-10-15 NOTE — Therapy (Signed)
[facility] [address] [place], [name], [address] Phone: [phone]   Fax:  [phone]  Physical Therapy Treatment  Patient Details  Name: Jose Beck MRN: [id number] Date of Birth: 10/18/1945 Referring Provider: Dr. Alysia Penna  Encounter Date: 10/15/2015      PT End of Session - 10/15/15 1202    Visit Number 12    Number of Visits 16   PT Start Time 4193   PT Stop Time 1227   PT Time Calculation (min) 39 min   Equipment Utilized During Treatment Gait belt   Activity Tolerance Patient tolerated treatment well   Behavior During Therapy [facility] for tasks assessed/performed      Past Medical History  Diagnosis Date  . Stroke ([place])   . Hypertension   . Diabetes mellitus without complication ([place])   . Hyperlipemia   . Heart disease     No past surgical history on file.  There were no vitals filed for this visit.      Subjective Assessment - 10/15/15 1155    Subjective Nothing new to report.   Patient is accompained by: Family member   Currently in Pain? No/denies                            [facility]) - 10/15/15 1216 STANDING   PWR! Up 20x   PWR! Rock 20x   PWR! Twist 20x   PWR Step  Cues for technique and posture and right UE awareness. 20x             Balance Exercises - 10/15/15 1157    Balance Exercises: Standing   Standing Eyes Opened Narrow base of support (BOS);Head turns;Foam/compliant surface  Progressed with multilevel reaching outside BOS, supervision with intermittent UE support   Standing Eyes Closed Foam/compliant surface;Wide (BOA);5 reps;10 secs;Head turns; supervision to min A due to LOB x1           PT Education - 10/15/15 1154    Education provided Yes   Education Details Discussed getting on the schedule for next week.   Person(s) Educated Patient;Spouse   Methods Explanation;Demonstration   Comprehension Verbalized understanding           PT Short Term Goals - 10/01/15 1423    PT SHORT TERM GOAL #1   Title verbalize understanding of CVA risk factors/warning signs to decrease risk of reinjury (09/18/15)   Status Achieved   PT SHORT TERM GOAL #2   Title improve BERG balance score to >/= 38/56 for improved balance    Status Achieved   PT SHORT TERM GOAL #3   Title improve timed up and go to < 30 sec with LRAD for improved mobility and function   Status Achieved   PT SHORT TERM GOAL #4   Title ambulate > 250' with LRAD modified independent on indoor/paved outdoor surfaces for improved function   Status Partially Met   PT SHORT TERM GOAL #5   Title negotiate > 8 steps with 1 handrail and supervision for improved function and ability to negotiate stairs at [employer]   Status Achieved           PT Long Term Goals - 08/21/15 0943    PT LONG TERM GOAL #1   Title independent with HEP (10/16/15)   Time 8   Period Weeks   Status New   PT LONG TERM GOAL #2   Title improve BERG  balance score to >/= 45/56 for decreased fall risk   PT LONG TERM GOAL #3   Title improve gait velocity to > 2.62 ft/sec for improved function and mobility   PT LONG TERM GOAL #4   Title improve timed up and go with LRAD to < 25 sec for improved function and mobility   PT LONG TERM GOAL #5   Title ambulate > 500' on various indoor/outdoor surfaces with LRAD modified independent for improved function and mobility.               Plan - 10/15/15 1223    Clinical Impression Statement Used PWR! Moves to work on balance and coordination, Pt used little to no UE support and required verbal and tactile cues for technique, demonstrated being challenged and requiring rest breaks when fatigued.   Rehab Potential Good   PT Frequency 2x / week   PT Duration 8 weeks   PT Treatment/Interventions ADLs/Self Care Home Management;Electrical Stimulation;Patient/family education;Neuromuscular re-education;Balance training;Therapeutic  exercise;Therapeutic activities;Functional mobility training;Stair training;Gait training;Vestibular   PT Next Visit Plan cont gait, balance, coordination   Consulted and Agree with Plan of Care Patient   Family Member Consulted wife      Patient will benefit from skilled therapeutic intervention in order to improve the following deficits and impairments:  Abnormal gait, Decreased balance, Decreased coordination, Decreased mobility, Postural dysfunction, Decreased strength, Decreased activity tolerance  Visit Diagnosis: Unsteadiness on feet  Muscle weakness (generalized)  Other abnormalities of gait and mobility  Difficulty in walking, not elsewhere classified  Other lack of coordination     Problem List Patient Active Problem List   Diagnosis Date Noted  . Brainstem infarct, acute ([place]) 09/17/2015  . 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 Obesity 09/17/2015  . 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 Gait disturbance, 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  . CVA (cerebral infarction) 08/09/2015   Bjorn Loser, PTA  10/15/2015, 4:05 PM [facility] [address] [place], [name], [address] Phone: [phone]   Fax:  [phone]  Name: Jose Beck MRN: [id number] Date of Birth: 1945/06/17

## 2015-10-15 NOTE — Therapy (Signed)
[facility] [address] [place], [name], [address] Phone: [phone]   Fax:  [phone]  Occupational Therapy Treatment  Patient Details  Name: Jose Beck MRN: [id number] Date of Birth: 1946-01-09 Referring Provider: Dr. Letta Pate  Encounter Date: 10/15/2015      OT End of Session - 10/15/15 1242    Visit Number 15   Date for OT Re-Evaluation 10/16/15   OT Start Time 1103   OT Stop Time 1145   OT Time Calculation (min) 42 min   Activity Tolerance Patient tolerated treatment well      Past Medical History  Diagnosis Date  . Stroke ([place])   . Hypertension   . Diabetes mellitus without complication ([place])   . Hyperlipemia   . Heart disease     History reviewed. No pertinent past surgical history.  There were no vitals filed for this visit.      Subjective Assessment - 10/15/15 1109    Subjective  I had a busy weekend.   Patient is accompained by: Family member  wife   Patient Stated Goals To get back to normal - my speech, my toothbrush, my R leg   Currently in Pain? No/denies                      OT Treatments/Exercises (OP) - 10/15/15 0001    Neurological Re-education Exercises   Other Exercises 1 Neuro re ed to address dynamic standing balance, functional ambulation, head turns, transitional movements, activity tolerance, and functional use of RUE during ambulatory tasks.  Pt with improving balance and safety awareness. Pt also improved on uneven surfaces.            PWR [facility]) - 10/15/15 1216    PWR! Up 20x   PWR! Rock 20x   PWR! Twist 20x   PWR Step 20x          Balance Exercises - 10/15/15 1157    Balance Exercises: Standing   Standing Eyes Opened Narrow base of support (BOS);Head turns;Foam/compliant surface  Progressed with multilevel reaching outside BOS, super.....   Standing Eyes Closed Foam/compliant surface;Wide (BOA);5 reps;10 secs;Head turns              OT Short Term Goals - 10/15/15 1238    OT SHORT TERM GOAL #1   Title Pt and wife will be mod I with HEP - 09/18/2015   Status Achieved   OT SHORT TERM GOAL #2   Title Pt will be mod I with toilet transfers   Status Achieved   OT SHORT TERM GOAL #3   Title Pt will be mod I with shower transfers   Status Achieved  wife still providing distant supevision   OT SHORT TERM GOAL #4   Title Pt will be able to cut meat, AE prn   Status Achieved   OT SHORT TERM GOAL #5   Title Pt will improve functional reaching/coordination as shown by improving score on box and blocks test by at least 10 bilaterally.   Baseline R-19blocks, L-29 blocks   Status Achieved  R=25, L = 39   OT SHORT TERM GOAL #6   Title Pt will demonstrate improved awareness as evidenced by being able to identify 2 cognitive deficits he is working on   Status Achieved           OT Long Term Goals - 10/15/15 1239    [name] #1   Title Pt  and wife will be mod I with upgraded HEP - 10/19/2015 (date adjusted to accomodate missed appt and still maintain 60 day certificaiton)   Status On-going   OT LONG TERM GOAL #2   Title Pt will be mod I with simple hot familiar meal prep   Status Achieved   OT LONG TERM GOAL #3   Title Pt will need no more than 2 vc's for moderate complex problem sovling within the context of a functional task.   Status Achieved   OT LONG TERM GOAL #4   Title Pt will demonstrate improved safety awareness with functional mobility to reach mod I level   Status Achieved   OT LONG TERM GOAL #5   Title Pt will demo improved coordination for ADLs as shown by completing 9-hole peg in 70sec or less with RUE.   Baseline 65min 55.16sec   Status Achieved  1.09.00               Plan - 10/15/15 1239    Clinical Impression Statement Pt progressing toward goals. Pt working on writing, coordination and functional use of RUE at home consistently.  Printing of name improving    Rehab  Potential Good   Clinical Impairments Affecting Rehab Potential decreaed awareness, cognition;  wife also appears to have poor understanding of pt's cogntive deficits.    OT Frequency 2x / week   OT Duration 8 weeks   OT Treatment/Interventions Self-care/ADL training;Therapeutic exercise;Neuromuscular education;DME and/or AE instruction;Manual Therapy;Therapist, nutritional;Therapeutic activities;Cognitive remediation/compensation;Patient/family education;Balance training;Moist Heat;Ultrasound   Plan update HEP in preparation for d/c, balance   Consulted and Agree with Plan of Care Patient;Family member/caregiver   Family Member Consulted wife      Patient will benefit from skilled therapeutic intervention in order to improve the following deficits and impairments:  Abnormal gait, Cardiopulmonary status limiting activity, Decreased balance, Decreased cognition, Decreased coordination, Decreased safety awareness, Decreased mobility, Decreased strength, Difficulty walking, Impaired UE functional use, Impaired sensation  Visit Diagnosis: Unsteadiness on feet  Muscle weakness (generalized)  Other abnormalities of gait and mobility  Abnormal posture  Hemiplegia, unspecified affecting right dominant side (HCC)  Frontal lobe and executive function deficit  Other disturbances of skin sensation  Other lack of coordination    Problem List Patient Active Problem List   Diagnosis Date Noted  . Brainstem infarct, acute ([place]) 09/17/2015  . 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 Obesity 09/17/2015  . 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 Gait disturbance, 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  . CVA (cerebral infarction) 08/09/2015    Quay Burow, OTR/L 10/15/2015, 12:44 PM  [facility] [address] [place], [name], [address] Phone: [phone]   Fax:  [phone]  Name:  Jose Beck MRN: [id number] Date of Birth: 06-20-1945

## 2015-10-15 NOTE — Therapy (Signed)
[facility] [address] [place], [name], [address] Phone: [phone]   Fax:  [phone]  Speech Language Pathology Treatment  Patient Details  Name: Jose Beck MRN: [id number] Date of Birth: 1946/03/30 Referring Provider: Dr, Lupe Carney  Encounter Date: 10/15/2015      End of Session - 10/15/15 1212    Visit Number 13   Number of Visits 17   Date for SLP Re-Evaluation 10/26/15   SLP Start Time 1017   SLP Stop Time  1100   SLP Time Calculation (min) 43 min   Activity Tolerance Patient tolerated treatment well      Past Medical History  Diagnosis Date  . Stroke ([place])   . Hypertension   . Diabetes mellitus without complication ([place])   . Hyperlipemia   . Heart disease     No past surgical history on file.  There were no vitals filed for this visit.      Subjective Assessment - 10/15/15 1026    Subjective Pt enters in to [place] room with reduced intelligibilty until SLP requested 3 repeats.   Currently in Pain? No/denies               ADULT SLP TREATMENT - 10/15/15 1027    General Information   Behavior/Cognition Alert;Cooperative;Pleasant mood   Treatment Provided   Treatment provided Cognitive-Linquistic   Cognitive-Linquistic Treatment   Treatment focused on Dysarthria   Skilled Treatment Pt remarked that he drank 3 drinks one night this weekend so SLP highlighted risk factors for CVA. In simple conversation pt req'd mod A initially for using compensations, then faded to rare min A. In multi sentence tasks pt used compensations with good (not excellent) success, requiring SLP min A. Pt self-corrected approx 70% of the time (for all possible opportunities) with all tasks throughtout session.   Assessment / Recommendations / Plan   Plan Continue with current plan of care   Progression Toward Goals   Progression toward goals Progressing toward goals          SLP Education -  10/15/15 1212    Education provided Yes   Education Details risk factors for CVA   Person(s) Educated Patient   Methods Explanation   Comprehension Verbalized understanding          SLP Short Term Goals - 10/04/15 1657    SLP SHORT TERM GOAL #1   Title Pt will perform dysarthria HEP with mod I   Status Not Met   SLP SHORT TERM GOAL #2   Title Pt will utilize compensations for dysarthira during structured tasks with rare min A over two sessions   Baseline one session 09-18-15   Status Achieved   SLP SHORT TERM GOAL #3   Title Pt will demonstrate utterance length of 5-7 words during structured speech/descriptive tasks with rare min A   Status Achieved          SLP Long Term Goals - 10/15/15 1213    SLP LONG TERM GOAL #1   Title Pt will be 95% intellgible during simple conversation of 10 minutes with rare min A, over three sessions   Status Achieved   SLP LONG TERM GOAL #2   Title Pt will participate in mildly complex conversation over 12 minutes with less than 3 requests for clarification/repeair communication breakdown over 3 sessions   Baseline one session 10-15-15   Time 2   Period Weeks   Status Revised   SLP LONG TERM GOAL #  3   Title Pt will utilize compensations for short term memory to recall details, lists, messages with 85% accuracy and rare min A.   Time 2   Period Weeks   Status On-going          Plan - 10/15/15 1213    Clinical Impression Statement Pt's need for verbal cues/instructions to employ compensations for dysarthria today appears less frequent than last week. Continue skilled ST to maximize intellgibility, awareness and utterance length, and to carryover memory strategies to community/home PRN.Marland Kitchen   Speech Therapy Frequency 2x / week   Duration 2 weeks   Treatment/Interventions Cognitive reorganization;Internal/external aids;Compensatory strategies;SLP instruction and feedback;Patient/family education;Functional tasks;Language facilitation   Potential  to Achieve Goals Good   Potential Considerations Ability to learn/carryover information      Patient will benefit from skilled therapeutic intervention in order to improve the following deficits and impairments:   Dysarthria and anarthria  Cognitive communication deficit    Problem List Patient Active Problem List   Diagnosis Date Noted  . Brainstem infarct, acute ([place]) 09/17/2015  . 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 Obesity 09/17/2015  . 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 Gait disturbance, 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  . CVA (cerebral infarction) 08/09/2015    [facility] ,[place], [place]   10/15/2015, 12:14 PM  [facility] [address] [place], [name], [address] Phone: [phone]   Fax:  [phone]   Name: Jose Beck MRN: [id number] Date of Birth: [birth year]

## 2015-10-16 ENCOUNTER — Encounter: Payer: Self-pay | Admitting: Occupational Therapy

## 2015-10-16 ENCOUNTER — Ambulatory Visit: Payer: Medicare Other | Admitting: Occupational Therapy

## 2015-10-16 ENCOUNTER — Ambulatory Visit: Payer: Medicare Other

## 2015-10-16 DIAGNOSIS — R262 Difficulty in walking, not elsewhere classified: Secondary | ICD-10-CM

## 2015-10-16 DIAGNOSIS — R41841 Cognitive communication deficit: Secondary | ICD-10-CM

## 2015-10-16 DIAGNOSIS — R2681 Unsteadiness on feet: Secondary | ICD-10-CM

## 2015-10-16 DIAGNOSIS — R293 Abnormal posture: Secondary | ICD-10-CM | POA: Diagnosis not present

## 2015-10-16 DIAGNOSIS — M6281 Muscle weakness (generalized): Secondary | ICD-10-CM

## 2015-10-16 DIAGNOSIS — R471 Dysarthria and anarthria: Secondary | ICD-10-CM

## 2015-10-16 DIAGNOSIS — R278 Other lack of coordination: Secondary | ICD-10-CM

## 2015-10-16 DIAGNOSIS — G8191 Hemiplegia, unspecified affecting right dominant side: Secondary | ICD-10-CM | POA: Diagnosis not present

## 2015-10-16 DIAGNOSIS — R2689 Other abnormalities of gait and mobility: Secondary | ICD-10-CM

## 2015-10-16 DIAGNOSIS — R41844 Frontal lobe and executive function deficit: Secondary | ICD-10-CM | POA: Diagnosis not present

## 2015-10-16 NOTE — Therapy (Signed)
[facility] [address] [place], [name], [address] Phone: [phone]   Fax:  [phone]  Speech Language Pathology Treatment  Patient Details  Name: Jose Beck MRN: [id number] Date of Birth: 1946/03/12 Referring Provider: Dr, Lupe Carney  Encounter Date: 10/16/2015      End of Session - 10/16/15 1247    Visit Number 14   Number of Visits 17   Date for SLP Re-Evaluation 10/26/15   SLP Start Time 1148   SLP Stop Time  15   SLP Time Calculation (min) 42 min   Activity Tolerance Patient tolerated treatment well      Past Medical History  Diagnosis Date  . Stroke ([place])   . Hypertension   . Diabetes mellitus without complication ([place])   . Hyperlipemia   . Heart disease     No past surgical history on file.  There were no vitals filed for this visit.      Subjective Assessment - 10/16/15 1153    Subjective Pt entered ST room with better focus on intelligibility today, compared to yesterday.   Patient is accompained by: Family member   Currently in Pain? No/denies               ADULT SLP TREATMENT - 10/16/15 1156    General Information   Behavior/Cognition Alert;Cooperative;Pleasant mood   Treatment Provided   Treatment provided Cognitive-Linquistic   Cognitive-Linquistic Treatment   Treatment focused on Dysarthria   Skilled Treatment Introduced pt to a more challenging HEP (tongue twisters). In multi-sentence tasks, pt req'd rare min A  for use of compensations.  In conversational "side comments", pt used compensations 50% of the time.    Assessment / Recommendations / Plan   Plan Continue with current plan of care   Progression Toward Goals   Progression toward goals Progressing toward goals          SLP Education - 10/16/15 1246    Education provided Yes   Education Details HEP dysarthria   Person(s) Educated Patient   Methods Explanation   Comprehension Verbalized  understanding          SLP Short Term Goals - 10/04/15 1657    SLP SHORT TERM GOAL #1   Title Pt will perform dysarthria HEP with mod I   Status Not Met   SLP SHORT TERM GOAL #2   Title Pt will utilize compensations for dysarthira during structured tasks with rare min A over two sessions   Baseline one session 09-18-15   Status Achieved   SLP SHORT TERM GOAL #3   Title Pt will demonstrate utterance length of 5-7 words during structured speech/descriptive tasks with rare min A   Status Achieved          SLP Long Term Goals - 10/16/15 1248    SLP LONG TERM GOAL #1   Title Pt will be 95% intellgible during simple conversation of 10 minutes with rare min A, over three sessions   Status Achieved   SLP LONG TERM GOAL #2   Title Pt will participate in mildly complex conversation over 12 minutes with less than 3 requests for clarification/repeair communication breakdown over 3 sessions   Baseline one session 10-15-15   Time 2   Period Weeks   Status Revised   SLP LONG TERM GOAL #3   Title Pt will utilize compensations for short term memory to recall details, lists, messages with 85% accuracy and rare min A.   Time  2   Period Weeks   Status On-going          Plan - 10/16/15 1247    Clinical Impression Statement Pt cont's the need for verbal cues/instructions to employ compensations for dysarthria today in multisentence tasks, and moreso in conversational side comments. Continue skilled ST to maximize intellgibility, awareness and utterance length, and to carryover memory strategies to community/home PRN.."   Speech Therapy Frequency 2x / week   Duration 2 weeks   Treatment/Interventions Cognitive reorganization;Internal/external aids;Compensatory strategies;SLP instruction and feedback;Patient/family education;Functional tasks;Language facilitation   Potential to Achieve Goals Good   Potential Considerations Ability to learn/carryover information      Patient will benefit from  skilled therapeutic intervention in order to improve the following deficits and impairments:   Dysarthria and anarthria  Cognitive communication deficit    Problem List Patient Active Problem List   Diagnosis Date Noted  . Brainstem infarct, acute ([place]) 09/17/2015  . 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 Obesity 09/17/2015  . 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 Gait disturbance, 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  . CVA (cerebral infarction) 08/09/2015    [facility] ,[place], [place]  10/16/2015, 12:49 PM  [facility] [address] [place], [name], [address] Phone: [phone]   Fax:  [phone]   Name: Jose Beck MRN: [id number] Date of Birth: 1945-10-07

## 2015-10-16 NOTE — Therapy (Signed)
[facility] [address] [place], [name], [address] Phone: [phone]   Fax:  [phone]  Occupational Therapy Treatment  Patient Details  Name: Jose Beck MRN: [id number] Date of Birth: February 01, 1946 Referring Provider: Dr. Letta Pate  Encounter Date: 10/16/2015      OT End of Session - 10/16/15 1204    Visit Number 16   Number of Visits 16   Date for OT Re-Evaluation 10/16/15   Authorization Type medicare   Authorization Time Period 60 days, Pt will need G code and PN every 10th visit   Authorization - Visit Number 16   Authorization - Number of Visits 16   OT Start Time 1102   OT Stop Time 1145   OT Time Calculation (min) 43 min   Activity Tolerance Patient tolerated treatment well      Past Medical History  Diagnosis Date  . Stroke ([place])   . Hypertension   . Diabetes mellitus without complication ([place])   . Hyperlipemia   . Heart disease     History reviewed. No pertinent past surgical history.  There were no vitals filed for this visit.      Subjective Assessment - 10/16/15 1109    Subjective  I think I am graduatiing today from OT!   Patient is accompained by: Family member  wife   Patient Stated Goals To get back to normal - my speech, my toothbrush, my R leg   Currently in Pain? No/denies                      OT Treatments/Exercises (OP) - 10/16/15 0001    Neurological Re-education Exercises   Other Exercises 1 Neuro re ed to address dyamic standing balance and balance reactions on outdoor uneven surfaces with head turns, transitional movements, transitioning from alternate surfaces, stops and starts, managing doors.  Pt with only 3 LOB and balance improving functionally           PWR [facility]) - 10/15/15 1216    PWR! Up 20x   PWR! Rock 20x   PWR! Twist 20x   PWR Step 20x          Balance Exercises - 10/15/15 1157    Balance Exercises: Standing   Standing  Eyes Opened Narrow base of support (BOS);Head turns;Foam/compliant surface  Progressed with multilevel reaching outside BOS, super.....   Standing Eyes Closed Foam/compliant surface;Wide (BOA);5 reps;10 secs;Head turns             OT Short Term Goals - 10/16/15 1203    OT SHORT TERM GOAL #1   Title Pt and wife will be mod I with HEP - 09/18/2015   Status Achieved   OT SHORT TERM GOAL #2   Title Pt will be mod I with toilet transfers   Status Achieved   OT SHORT TERM GOAL #3   Title Pt will be mod I with shower transfers   Status Achieved  wife still providing distant supevision   OT SHORT TERM GOAL #4   Title Pt will be able to cut meat, AE prn   Status Achieved   OT SHORT TERM GOAL #5   Title Pt will improve functional reaching/coordination as shown by improving score on box and blocks test by at least 10 bilaterally.   Baseline R-19blocks, L-29 blocks   Status Achieved  R=25, L = 39   OT SHORT TERM GOAL #6   Title Pt will demonstrate improved awareness as  evidenced by being able to identify 2 cognitive deficits he is working on   Status Achieved           OT Long Term Goals - 2015/10/30 [place] #1   Title Pt and wife will be mod I with upgraded HEP - 10/19/2015 (date adjusted to accomodate missed appt and still maintain 60 day certificaiton)   Status Achieved   OT LONG TERM GOAL #2   Title Pt will be mod I with simple hot familiar meal prep   Status Achieved   OT LONG TERM GOAL #3   Title Pt will need no more than 2 vc's for moderate complex problem sovling within the context of a functional task.   Status Achieved   OT LONG TERM GOAL #4   Title Pt will demonstrate improved safety awareness with functional mobility to reach mod I level   Status Achieved   OT LONG TERM GOAL #5   Title Pt will demo improved coordination for ADLs as shown by completing 9-hole peg in 70sec or less with RUE.   Baseline 6mn 55.16sec   Status Achieved  1.09.00                Plan - [id number]    Clinical Impression Statement Pt has met all goals and is ready for discharge. Pt and wife in agreement.   Rehab Potential Good   Clinical Impairments Affecting Rehab Potential decreaed awareness, cognition;  wife also appears to have poor understanding of pt's cogntive deficits.    OT Frequency 2x / week   OT Duration 8 weeks   OT Treatment/Interventions Self-care/ADL training;Therapeutic exercise;Neuromuscular education;DME and/or AE instruction;Manual Therapy;FTherapist, nutritionalTherapeutic activities;Cognitive remediation/compensation;Patient/family education;Balance training;Moist Heat;Ultrasound   Plan d/c from OT   Consulted and Agree with Plan of Care Patient;Family member/caregiver   Family Member Consulted wife      Patient will benefit from skilled therapeutic intervention in order to improve the following deficits and impairments:  Abnormal gait, Cardiopulmonary status limiting activity, Decreased balance, Decreased cognition, Decreased coordination, Decreased safety awareness, Decreased mobility, Decreased strength, Difficulty walking, Impaired UE functional use, Impaired sensation  Visit Diagnosis: Unsteadiness on feet  Muscle weakness (generalized)  Other abnormalities of gait and mobility  Difficulty in walking, not elsewhere classified  Other lack of coordination      G-Codes - 0June 20, 20171205    Functional Assessment Tool Used 9 hole peg, box and blocks, skilled clinical observation   Functional Limitation Self care   Self Care Current Status (([id number] At least 1 percent but less than 20 percent impaired, limited or restricted   Self Care Goal Status (([id number] At least 40 percent but less than 60 percent impaired, limited or restricted   Self Care Discharge Status [phone] At least 1 percent but less than 20 percent impaired, limited or restricted      Problem List Patient Active Problem List   Diagnosis Date  Noted  . [place] infarct, acute ([place] 09/17/2015  . 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 Obesity 09/17/2015  . 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 Gait disturbance, 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  . CVA (cerebral infarction) 08/09/2015   OCCUPATIONAL THERAPY DISCHARGE SUMMARY  Visits from Start of Care: 16  Current functional level related to goals / functional outcomes: See above   Remaining deficits: Decreased balance, dystonic RUE, apraxia, mild cognitive deficits, decreased RUE coordination   Education / Equipment: HEP Plan: Patient agrees to  discharge.  Patient goals were met. Patient is being discharged due to meeting the stated rehab goals.  ?????     Quay Burow, OTR/L 10/16/2015, 12:06 PM  [facility] [address] [place], [name], [address] Phone: [phone]   Fax:  [phone]  Name: Karmello Abercrombie MRN: [id number] Date of Birth: 07-Aug-1945

## 2015-10-18 ENCOUNTER — Telehealth: Payer: Self-pay | Admitting: Neurology

## 2015-10-18 ENCOUNTER — Ambulatory Visit: Payer: Medicare Other | Admitting: Physical Therapy

## 2015-10-18 ENCOUNTER — Telehealth: Payer: Self-pay | Admitting: Physical Medicine & Rehabilitation

## 2015-10-18 ENCOUNTER — Encounter: Payer: Self-pay | Admitting: Physical Therapy

## 2015-10-18 DIAGNOSIS — R262 Difficulty in walking, not elsewhere classified: Secondary | ICD-10-CM

## 2015-10-18 DIAGNOSIS — R293 Abnormal posture: Secondary | ICD-10-CM | POA: Diagnosis not present

## 2015-10-18 DIAGNOSIS — R2689 Other abnormalities of gait and mobility: Secondary | ICD-10-CM

## 2015-10-18 DIAGNOSIS — M6281 Muscle weakness (generalized): Secondary | ICD-10-CM | POA: Diagnosis not present

## 2015-10-18 DIAGNOSIS — R278 Other lack of coordination: Secondary | ICD-10-CM

## 2015-10-18 DIAGNOSIS — R2681 Unsteadiness on feet: Secondary | ICD-10-CM | POA: Diagnosis not present

## 2015-10-18 DIAGNOSIS — G8191 Hemiplegia, unspecified affecting right dominant side: Secondary | ICD-10-CM | POA: Diagnosis not present

## 2015-10-18 DIAGNOSIS — R41844 Frontal lobe and executive function deficit: Secondary | ICD-10-CM | POA: Diagnosis not present

## 2015-10-18 NOTE — Therapy (Signed)
[facility] [address] [place], [name], [address] Phone: [phone]   Fax:  [phone]  Physical Therapy Treatment  Patient Details  Name: Jose Beck MRN: [id number] Date of Birth: April 24, 1946 Referring Provider: Dr. Alysia Penna  Encounter Date: 10/18/2015      PT End of Session - 10/18/15 1216    Visit Number 13   Number of Visits 16   Date for PT Re-Evaluation 10/16/15   PT Start Time 1105   PT Stop Time 1145   PT Time Calculation (min) 40 min   Equipment Utilized During Treatment Gait belt   Activity Tolerance Patient tolerated treatment well   Behavior During Therapy [facility] for tasks assessed/performed      Past Medical History  Diagnosis Date  . Stroke ([place])   . Hypertension   . Diabetes mellitus without complication ([place])   . Hyperlipemia   . Heart disease     History reviewed. No pertinent past surgical history.  There were no vitals filed for this visit.      Subjective Assessment - 10/18/15 1106    Subjective Continues to work on HEP at home: walking and strengthening program   Patient is accompained by: Family member   Pertinent History HTN, HLD, DM, CAD   Limitations Standing;Walking;House hold activities   Patient Stated Goals improve function and mobility; wants to go to beach in May with many stairs to navigate   Currently in Pain? No/denies                         [facility] Adult PT Treatment/Exercise - 10/18/15 0001    Ambulation/Gait   Ambulation/Gait Yes   Ambulation/Gait Assistance 5: Supervision;   Ambulation/Gait Assistance Details worked on balance with visual scanning and maintaining good R foot clearance.   Ambulation Distance (Feet) 1000 Feet   Assistive device None   Gait Pattern Step-through pattern;Poor foot clearance - right;Decreased arm swing - right  but no foot drag on unlevel surface   Ambulation Surface  Level;Unlevel;Indoor;Outdoor;Paved;Gravel;Grass  no LOB           [facility]) - 10/18/15 1108    PWR! Up x20   PWR! Rock [employer]! Twist x20   PWR Step x20   Comments intermittent cues for L hand awareness          Balance Exercises - 10/18/15 1119    Balance Exercises: Standing   Step Ups Forward;4 inch;6 inch;Intermittent UE support  to no UE support; progressing to amb. forwards and negotiating steps at various heights and compliant surface.           PT Education - 10/18/15 1213    Education provided Yes   Education Details pt plans to visit [facility]; encouraged aerobic activity with walking program or seated exercise bike.   Person(s) Educated Patient   Methods Explanation   Comprehension Verbalized understanding          PT Short Term Goals - 10/01/15 1423    PT SHORT TERM GOAL #1   Title verbalize understanding of CVA risk factors/warning signs to decrease risk of reinjury (09/18/15)   Status Achieved   PT SHORT TERM GOAL #2   Title improve BERG balance score to >/= 38/56 for improved balance    Status Achieved   PT SHORT TERM GOAL #3   Title improve timed up and go to < 30 sec with LRAD for improved mobility and  function   Status Achieved   PT SHORT TERM GOAL #4   Title ambulate > 250' with LRAD modified independent on indoor/paved outdoor surfaces for improved function   Status Partially Met   PT SHORT TERM GOAL #5   Title negotiate > 8 steps with 1 handrail and supervision for improved function and ability to negotiate stairs at beach house   Status Achieved           PT Long Term Goals - 08/21/15 0943    PT LONG TERM GOAL #1   Title independent with HEP (10/16/15)   Time 8   Period Weeks   Status New   PT LONG TERM GOAL #2   Title improve BERG balance score to >/= 45/56 for decreased fall risk   PT LONG TERM GOAL #3   Title improve gait velocity to > 2.62 ft/sec for improved function and mobility   PT LONG TERM GOAL #4   Title  improve timed up and go with LRAD to < 25 sec for improved function and mobility   PT LONG TERM GOAL #5   Title ambulate > 500' on various indoor/outdoor surfaces with LRAD modified independent for improved function and mobility.               Plan - 10/18/15 1217    Clinical Impression Statement Progressing with outdoor gait on various surfaces: had no LOB, no reports of fatigue with longer gait distance and good steady movement.   Rehab Potential Good   PT Frequency 2x / week   PT Duration 8 weeks   PT Treatment/Interventions ADLs/Self Care Home Management;Electrical Stimulation;Patient/family education;Neuromuscular re-education;Balance training;Therapeutic exercise;Therapeutic activities;Functional mobility training;Stair training;Gait training;Vestibular   PT Next Visit Plan cont gait, balance, coordination   Consulted and Agree with Plan of Care Patient   Family Member Consulted wife      Patient will benefit from skilled therapeutic intervention in order to improve the following deficits and impairments:  Abnormal gait, Decreased balance, Decreased coordination, Decreased mobility, Postural dysfunction, Decreased strength, Decreased activity tolerance  Visit Diagnosis: Unsteadiness on feet  Muscle weakness (generalized)  Other abnormalities of gait and mobility  Difficulty in walking, not elsewhere classified  Other lack of coordination     Problem List Patient Active Problem List   Diagnosis Date Noted  . Brainstem infarct, acute ([place]) 09/17/2015  . 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 Obesity 09/17/2015  . 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 Gait disturbance, 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  . CVA (cerebral infarction) 08/09/2015    Bjorn Loser, PTA  10/18/2015, 4:16 PM [facility] [address] [place], [name], [address] Phone: [phone]   Fax:   [phone]  Name: Jose Beck MRN: [id number] Date of Birth: 1945-09-21

## 2015-10-18 NOTE — Telephone Encounter (Signed)
Wife called to request a letter to be excused permanently from jury duty, she has received summons for jury duty 11/27/15. Wife is the primary caregiver for husband, states he has appointment on that day and she has appointment on that day and will have to go with her.

## 2015-10-18 NOTE — Telephone Encounter (Signed)
Patient's wife is needing a letter to excuse her from jury duty.  She is the patient's primary caregiver and he has no one else to look after him.  Please call wife when letter is complete.

## 2015-10-19 NOTE — Telephone Encounter (Signed)
Kindly inform the patient's wife that she may reschedule the patient's medical appointment and Madaline Savage duty excuse for patient's family member usually will not be honored

## 2015-10-22 ENCOUNTER — Ambulatory Visit: Payer: Medicare Other | Admitting: Physical Therapy

## 2015-10-22 DIAGNOSIS — R262 Difficulty in walking, not elsewhere classified: Secondary | ICD-10-CM

## 2015-10-22 DIAGNOSIS — R2681 Unsteadiness on feet: Secondary | ICD-10-CM | POA: Diagnosis not present

## 2015-10-22 DIAGNOSIS — R2689 Other abnormalities of gait and mobility: Secondary | ICD-10-CM | POA: Diagnosis not present

## 2015-10-22 DIAGNOSIS — R260 Ataxic gait: Secondary | ICD-10-CM

## 2015-10-22 DIAGNOSIS — M6281 Muscle weakness (generalized): Secondary | ICD-10-CM

## 2015-10-22 DIAGNOSIS — R41844 Frontal lobe and executive function deficit: Secondary | ICD-10-CM | POA: Diagnosis not present

## 2015-10-22 DIAGNOSIS — G8191 Hemiplegia, unspecified affecting right dominant side: Secondary | ICD-10-CM | POA: Diagnosis not present

## 2015-10-22 DIAGNOSIS — R278 Other lack of coordination: Secondary | ICD-10-CM

## 2015-10-22 DIAGNOSIS — R293 Abnormal posture: Secondary | ICD-10-CM | POA: Diagnosis not present

## 2015-10-22 NOTE — Therapy (Signed)
[facility] [address] [place], [name], [address] Phone: [phone]   Fax:  [phone]  Physical Therapy Treatment  Patient Details  Name: Jose Beck MRN: [id number] Date of Birth: [birth year] Referring Provider: Dr. Alysia Penna  Encounter Date: 10/22/2015      PT End of Session - 10/22/15 1552    Visit Number 14   Number of Visits 16   Date for PT Re-Evaluation 10/16/15   PT Start Time 2202   PT Stop Time 1448   PT Time Calculation (min) 43 min   Activity Tolerance Patient tolerated treatment well   Behavior During Therapy [facility] for tasks assessed/performed      Past Medical History  Diagnosis Date  . Stroke ([place])   . Hypertension   . Diabetes mellitus without complication ([place])   . Hyperlipemia   . Heart disease     No past surgical history on file.  There were no vitals filed for this visit.      Subjective Assessment - 10/22/15 1410    Subjective Pt did a lot more independent, functional activities this weekend   Patient is accompained by: Family member   Pertinent History HTN, HLD, DM, CAD   Limitations Standing;Walking;House hold activities   Patient Stated Goals improve function and mobility; wants to go to beach in May with many stairs to navigate   Currently in Pain? No/denies            [facility] PT Assessment - 10/22/15 0001    Timed Up and Go Test   TUG Normal TUG   Normal TUG (seconds) 12.5   TUG Comments no AD                     [facility] Adult PT Treatment/Exercise - 10/22/15 0001    Ambulation/Gait   Ambulation/Gait Yes   Ambulation/Gait Assistance 5: Supervision;6: Modified independent (Device/Increase time)  supervision without AD   Ambulation Distance (Feet) 200 Feet   Assistive device None;Straight cane   Gait Pattern Step-through pattern;Poor foot clearance - right;Decreased arm swing - right   Ambulation Surface Level   Gait velocity 3.05  ft/sec  with SPC           [facility]) - 10/22/15 1548    PWR! Up x10   PWR! Rock x10   PWR! Twist x10   PWR Step x10   Comments No UE support             PT Education - 10/22/15 1551    Education provided Yes   Education Details Discussed goals checked and plan for d/c next visit. PWR! Moves in standing for balance and coordination.   Person(s) Educated Patient   Methods Explanation, handout   Comprehension Verbalized understanding          PT Short Term Goals - 10/01/15 1423    PT SHORT TERM GOAL #1   Title verbalize understanding of CVA risk factors/warning signs to decrease risk of reinjury (09/18/15)   Status Achieved   PT SHORT TERM GOAL #2   Title improve BERG balance score to >/= 38/56 for improved balance    Status Achieved   PT SHORT TERM GOAL #3   Title improve timed up and go to < 30 sec with LRAD for improved mobility and function   Status Achieved   PT SHORT TERM GOAL #4   Title ambulate > 250' with LRAD modified independent on indoor/paved outdoor surfaces for improved  function   Status Partially Met   PT SHORT TERM GOAL #5   Title negotiate > 8 steps with 1 handrail and supervision for improved function and ability to negotiate stairs at beach house   Status Achieved           PT Long Term Goals - 10/22/15 [place] #1   Title independent with HEP (10/16/15)   Baseline Met; 10/21/13   Time 8   Period Weeks   Status Achieved   PT LONG TERM GOAL #2   Title improve BERG balance score to >/= 45/56 for decreased fall risk   PT LONG TERM GOAL #3   Title improve gait velocity to > 2.62 ft/sec for improved function and mobility   Baseline 3.21f/sec with cane 10/22/15   Status Achieved   PT LONG TERM GOAL #4   Title improve timed up and go with LRAD to < 25 sec for improved function and mobility   Baseline 12.5 sec no AD, 10/22/15   Status Achieved   PT LONG TERM GOAL #5   Title ambulate > 500' on various indoor/outdoor surfaces  with LRAD modified independent for improved function and mobility.               Plan - 10/22/15 1555    Clinical Impression Statement Pt had made significant progress with gait mechanics and balance as demonstrated by TUG and gait velocity test. Pt is ind. with HEP and is agreeable to d/c next visit.   Rehab Potential Good   PT Frequency 2x / week   PT Duration 8 weeks   PT Treatment/Interventions ADLs/Self Care Home Management;Electrical Stimulation;Patient/family education;Neuromuscular re-education;Balance training;Therapeutic exercise;Therapeutic activities;Functional mobility training;Stair training;Gait training;Vestibular   PT Next Visit Plan check goals, g-code, and d/c next visit.   Consulted and Agree with Plan of Care Patient   Family Member Consulted wife      Patient will benefit from skilled therapeutic intervention in order to improve the following deficits and impairments:  Abnormal gait, Decreased balance, Decreased coordination, Decreased mobility, Postural dysfunction, Decreased strength, Decreased activity tolerance  Visit Diagnosis: Unsteadiness on feet  Muscle weakness (generalized)  Other abnormalities of gait and mobility  Difficulty in walking, not elsewhere classified  Other lack of coordination  Ataxic gait     Problem List Patient Active Problem List   Diagnosis Date Noted  . [place] infarct, acute ([place] 09/17/2015  . 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 Obesity 09/17/2015  . 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 Gait disturbance, 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  . CVA (cerebral infarction) 08/09/2015    KBjorn Loser6/04/2016, 3:57 PM  [place] [address]Phone: [phone]  Fax:  [phone] Name: Jose FelchMRN: [id number] of Birth: [birth year]

## 2015-10-22 NOTE — Telephone Encounter (Signed)
Rn call patients wife back about needing a letter for jury duty. Rn stated per Dr. Leonie Man doing a permanent letter for a family member is not honored. Rn stated typically jury duty letter can be done for a patient based on the MD evaluation and notes. Rn states sometimes patients are not given a letter because they have been stroke free for years. Rn ask patients wife can a neighbor or family member watch her husband while she is out. Pts wife she is the caregiver. Pt walks with a walker and is therapy. Pt is not home bound and goes to outpatient therapy next door. Pts wife verbalized understanding.

## 2015-10-23 ENCOUNTER — Ambulatory Visit: Payer: Medicare Other

## 2015-10-23 ENCOUNTER — Encounter: Payer: Medicare Other | Admitting: Occupational Therapy

## 2015-10-23 DIAGNOSIS — M6281 Muscle weakness (generalized): Secondary | ICD-10-CM | POA: Diagnosis not present

## 2015-10-23 DIAGNOSIS — R471 Dysarthria and anarthria: Secondary | ICD-10-CM

## 2015-10-23 DIAGNOSIS — R41841 Cognitive communication deficit: Secondary | ICD-10-CM

## 2015-10-23 DIAGNOSIS — R2689 Other abnormalities of gait and mobility: Secondary | ICD-10-CM | POA: Diagnosis not present

## 2015-10-23 DIAGNOSIS — R293 Abnormal posture: Secondary | ICD-10-CM | POA: Diagnosis not present

## 2015-10-23 DIAGNOSIS — R2681 Unsteadiness on feet: Secondary | ICD-10-CM | POA: Diagnosis not present

## 2015-10-23 DIAGNOSIS — R41844 Frontal lobe and executive function deficit: Secondary | ICD-10-CM | POA: Diagnosis not present

## 2015-10-23 DIAGNOSIS — G8191 Hemiplegia, unspecified affecting right dominant side: Secondary | ICD-10-CM | POA: Diagnosis not present

## 2015-10-23 NOTE — Patient Instructions (Signed)
Focus on using compensations for your speech in EVERY conversation!

## 2015-10-23 NOTE — Therapy (Signed)
[facility] [address] [place], [name], [address] Phone: [phone]   Fax:  [phone]  Speech Language Pathology Treatment  Patient Details  Name: Jose Beck MRN: [id number] Date of Birth: 1946-01-21 Referring Provider: Dr, Lupe Carney  Encounter Date: 10/23/2015      End of Session - 10/23/15 1451    Visit Number 15   Number of Visits 17   Date for SLP Re-Evaluation 10/26/15   SLP Start Time 7482   SLP Stop Time  1446   SLP Time Calculation (min) 41 min   Activity Tolerance Patient tolerated treatment well      Past Medical History  Diagnosis Date  . Stroke ([place])   . Hypertension   . Diabetes mellitus without complication ([place])   . Hyperlipemia   . Heart disease     No past surgical history on file.  There were no vitals filed for this visit.             ADULT SLP TREATMENT - 10/23/15 1433    General Information   Behavior/Cognition Alert;Cooperative;Pleasant mood   Treatment Provided   Treatment provided Cognitive-Linquistic   Cognitive-Linquistic Treatment   Treatment focused on Dysarthria   Skilled Treatment In mod complex conversation pt used compensations approx 60% of the time, until SLP timed each 3-4 minute increment in the conversation, then pt improved use of compensations to 75%.   Assessment / Recommendations / Plan   Plan Continue with current plan of care   Progression Toward Goals   Progression toward goals Progressing toward goals            SLP Short Term Goals - 10/04/15 1657    SLP SHORT TERM GOAL #1   Title Pt will perform dysarthria HEP with mod I   Status Not Met   SLP SHORT TERM GOAL #2   Title Pt will utilize compensations for dysarthira during structured tasks with rare min A over two sessions   Baseline one session 09-18-15   Status Achieved   SLP SHORT TERM GOAL #3   Title Pt will demonstrate utterance length of 5-7 words during  structured speech/descriptive tasks with rare min A   Status Achieved          SLP Long Term Goals - 10/23/15 1748    SLP LONG TERM GOAL #1   Title Pt will be 95% intellgible during simple conversation of 10 minutes with rare min A, over three sessions   Status Achieved   SLP LONG TERM GOAL #2   Title Pt will participate in mildly complex conversation over 12 minutes with less than 3 requests for clarification/repeair communication breakdown over 3 sessions   Baseline one session 10-15-15   Time 2   Period Weeks   Status Revised   SLP LONG TERM GOAL #3   Title Pt will utilize compensations for short term memory to recall details, lists, messages with 85% accuracy and rare min A.   Time 2   Period Weeks   Status On-going          Plan - 10/23/15 1751    Clinical Impression Statement Pt cont's the need for verbal cues/instructions to employ compensations for dysarthria in multisentence tasks, and  in conversation. Pt reports this is bothersome to him, and would like to continue to work on his speech intelligibility. Recently, pt has begun to make more concerted effort at improving his intelligibility, likely due to improved awareness. Continue skilled ST  is necessary to improve pt's intelligibility in conversation. Memory compensations will also be addressed in the next four weeks.,   Speech Therapy Frequency 2x / week   Duration 2 weeks   Treatment/Interventions Cognitive reorganization;Internal/external aids;Compensatory strategies;SLP instruction and feedback;Patient/family education;Functional tasks;Language facilitation   Potential to Achieve Goals Good   Potential Considerations Ability to learn/carryover information      Patient will benefit from skilled therapeutic intervention in order to improve the following deficits and impairments:   Dysarthria and anarthria  Cognitive communication deficit    Problem List Patient Active Problem List   Diagnosis Date Noted  .  Brainstem infarct, acute ([place]) 09/17/2015  . 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 Obesity 09/17/2015  . 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 Gait disturbance, 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  . CVA (cerebral infarction) 08/09/2015    [facility] ,[place], [place]   10/23/2015, 5:54 PM  [facility] [address] [place], [name], [address] Phone: [phone]   Fax:  [phone]   Name: Jose Beck MRN: [id number] Date of Birth: [birth year]

## 2015-10-24 NOTE — Telephone Encounter (Signed)
Do you want this letter prepared? Please advise.

## 2015-10-24 NOTE — Telephone Encounter (Signed)
Spoke with wife Date of Madaline Savage Duty is 11/27/15, please reference Juror # [phone] in the requested letter

## 2015-10-25 ENCOUNTER — Encounter: Payer: Medicare Other | Admitting: Occupational Therapy

## 2015-10-25 ENCOUNTER — Ambulatory Visit: Payer: Medicare Other

## 2015-10-25 ENCOUNTER — Encounter: Payer: Self-pay | Admitting: Rehabilitation

## 2015-10-25 ENCOUNTER — Ambulatory Visit: Payer: Medicare Other | Admitting: Rehabilitation

## 2015-10-25 DIAGNOSIS — R2681 Unsteadiness on feet: Secondary | ICD-10-CM

## 2015-10-25 DIAGNOSIS — R2689 Other abnormalities of gait and mobility: Secondary | ICD-10-CM | POA: Diagnosis not present

## 2015-10-25 DIAGNOSIS — R471 Dysarthria and anarthria: Secondary | ICD-10-CM

## 2015-10-25 DIAGNOSIS — R293 Abnormal posture: Secondary | ICD-10-CM | POA: Diagnosis not present

## 2015-10-25 DIAGNOSIS — G8191 Hemiplegia, unspecified affecting right dominant side: Secondary | ICD-10-CM | POA: Diagnosis not present

## 2015-10-25 DIAGNOSIS — R41844 Frontal lobe and executive function deficit: Secondary | ICD-10-CM | POA: Diagnosis not present

## 2015-10-25 DIAGNOSIS — M6281 Muscle weakness (generalized): Secondary | ICD-10-CM | POA: Diagnosis not present

## 2015-10-25 DIAGNOSIS — R41841 Cognitive communication deficit: Secondary | ICD-10-CM

## 2015-10-25 NOTE — Therapy (Signed)
[facility] [address] [place], [name], [address] Phone: [phone]   Fax:  [phone]  Speech Language Pathology Treatment  Patient Details  Name: Jose Beck MRN: [id number] Date of Birth: 1945-06-19 Referring Provider: Dr, Lupe Carney  Encounter Date: 10/25/2015      End of Session - 10/25/15 1446    Visit Number 16   Number of Visits 24   Date for SLP Re-Evaluation 11/30/15   SLP Start Time 83   SLP Stop Time  1445   SLP Time Calculation (min) 43 min   Activity Tolerance Patient tolerated treatment well      Past Medical History  Diagnosis Date  . Stroke ([place])   . Hypertension   . Diabetes mellitus without complication ([place])   . Hyperlipemia   . Heart disease     No past surgical history on file.  There were no vitals filed for this visit.      Subjective Assessment - 10/25/15 1433    Subjective Pt's wife came with pt today to discuss progress.   Patient is accompained by: --  wife [name]   Currently in Pain? No/denies               ADULT SLP TREATMENT - 10/25/15 1436    General Information   Behavior/Cognition Alert;Cooperative;Pleasant mood   Treatment Provided   Treatment provided Cognitive-Linquistic   Cognitive-Linquistic Treatment   Treatment focused on Dysarthria   Skilled Treatment (cognitive skills 15 minutes): Pt's wife told SLP of a situation with a bank payment that pt paid, twice, in two months. Pt had already come up with a fix for this, after their visit to the bank yesterday to inquire about this. (speech tx): In discussion about cognitive ability/skills pt had rushes of speech decreasing intelligibilty x3, with pt self correcting 2/3 times.    Assessment / Recommendations / Plan   Plan Continue with current plan of care;Goals updated   Progression Toward Goals   Progression toward goals --  Goals updated; therapy renewed four weeks beginning 10-29-15           SLP Education - 10/25/15 1445    Education provided Yes   Education Details goal update and continuing goals   Person(s) Educated Patient   Methods Explanation   Comprehension Verbalized understanding          SLP Short Term Goals - 10/04/15 1657    SLP SHORT TERM GOAL #1   Title Pt will perform dysarthria HEP with mod I   Status Not Met   SLP SHORT TERM GOAL #2   Title Pt will utilize compensations for dysarthira during structured tasks with rare min A over two sessions   Baseline one session 09-18-15   Status Achieved   SLP SHORT TERM GOAL #3   Title Pt will demonstrate utterance length of 5-7 words during structured speech/descriptive tasks with rare min A   Status Achieved          SLP Long Term Goals - 10/25/15 1413    SLP LONG TERM GOAL #1   Title Pt will be 95% intellgible during simple conversation of 10 minutes with rare min A, over three sessions   Status Achieved   SLP LONG TERM GOAL #2   Title Pt will participate in mildly complex conversation over 12 minutes with less than 3 requests for clarification/repeair communication breakdown over 3 sessions   Baseline one session 10-15-15   Time 2  Period Weeks   Status On-going  not met; renewed for week of 10-29-15   SLP LONG TERM GOAL #3   Title Pt will utilize compensations for short term memory to recall details, lists, messages with 85% accuracy and rare min A.   Time 4   Period Weeks   Status Deferred   SLP LONG TERM GOAL #4   Title Pt will report utilization of compensations for short term memory in household cognitive-linguistic tasks   Time 4   Period Weeks   Status New  renewed for week of 10-29-15          Plan - 10/25/15 1447    Clinical Impression Statement Pt cont's the need for verbal cues/instructions to employ compensations for dysarthria in multisentence tasks, and  in conversation. Pt reports his speech is bothersome to him, and would like to continue to work on his speech  intelligibility. Recently, pt has begun to make more concerted effort at improving his intelligibility, likely due to improved awareness. Continue skilled ST is necessary to improve pt's intelligibility in conversation. Memory compensations will also be addressed in the next four weeks in functional situations named by pt/wife.   Speech Therapy Frequency 2x / week   Duration 2 weeks   Treatment/Interventions Cognitive reorganization;Internal/external aids;Compensatory strategies;SLP instruction and feedback;Patient/family education;Functional tasks;Language facilitation   Potential to Achieve Goals Good   Potential Considerations Ability to learn/carryover information      Patient will benefit from skilled therapeutic intervention in order to improve the following deficits and impairments:   Dysarthria and anarthria  Cognitive communication deficit    Problem List Patient Active Problem List   Diagnosis Date Noted  . Brainstem infarct, acute ([place]) 09/17/2015  . 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 Obesity 09/17/2015  . 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 Gait disturbance, 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  . CVA (cerebral infarction) 08/09/2015    [facility] ,[place], [place]  10/25/2015, 2:48 PM  [facility] [address] [place], [name], [address] Phone: [phone]   Fax:  [phone]   Name: Jose Beck MRN: [id number] Date of Birth: 06/25/1945

## 2015-10-25 NOTE — Therapy (Signed)
[facility] [address] [place], [name], [address] Phone: [phone]   Fax:  [phone]  Physical Therapy Treatment and D/C Summary  Patient Details  Name: Jose Beck MRN: [id number] Date of Birth: August 17, 1945 Referring Provider: Dr. Alysia Penna  Encounter Date: 10/25/2015      PT End of Session - 10/25/15 1452    Visit Number 15   Number of Visits 16   Date for PT Re-Evaluation 10/16/15   PT Start Time 1829   PT Stop Time 1533   PT Time Calculation (min) 48 min   Activity Tolerance Patient tolerated treatment well   Behavior During Therapy [facility] for tasks assessed/performed      Past Medical History  Diagnosis Date  . Stroke ([place])   . Hypertension   . Diabetes mellitus without complication ([place])   . Hyperlipemia   . Heart disease     History reviewed. No pertinent past surgical history.  There were no vitals filed for this visit.      Subjective Assessment - 10/25/15 1450    Subjective "They say I"m doing a lot better."    Patient is accompained by: Family member   Pertinent History HTN, HLD, DM, CAD   Limitations Standing;Walking;House hold activities   Patient Stated Goals improve function and mobility; wants to go to beach in May with many stairs to navigate   Currently in Pain? No/denies                         [facility] Adult PT Treatment/Exercise - 10/25/15 1445    Ambulation/Gait   Ambulation/Gait Yes   Ambulation/Gait Assistance 6: Modified independent (Device/Increase time)   Ambulation/Gait Assistance Details Ambulated >500' with SPC over indoor and varying outdoor surfaces.   Pt able to ambulate at mod I level.  Single LOB during gait over grass, however was due to trying to move bug from face and was able to recover without assistance.     Ambulation Distance (Feet) 600 Feet   Assistive device Straight cane   Gait Pattern Step-through pattern;Poor foot  clearance - right;Decreased arm swing - right   Ambulation Surface Level;Unlevel;Indoor;Outdoor;Paved;Grass   Ramp 6: Modified independent (Device)   Curb 6: Modified independent (Device/increase time)   Berg Balance Test   Sit to Stand Able to stand without using hands and stabilize independently   Standing Unsupported Able to stand safely 2 minutes   Sitting with Back Unsupported but Feet Supported on Floor or Stool Able to sit safely and securely 2 minutes   Stand to Sit Sits safely with minimal use of hands   Transfers Able to transfer safely, minor use of hands   Standing Unsupported with Eyes Closed Able to stand 10 seconds safely   Standing Ubsupported with Feet Together Able to place feet together independently and stand 1 minute safely   From Standing, Reach Forward with Outstretched Arm Can reach forward >12 cm safely (5")   From Standing Position, Pick up Object from Floor Able to pick up shoe safely and easily   From Standing Position, Turn to Look Behind Over each Shoulder Looks behind from both sides and weight shifts well   Turn 360 Degrees Able to turn 360 degrees safely but slowly   Standing Unsupported, Alternately Place Feet on Step/Stool Able to stand independently and complete 8 steps >20 seconds   Standing Unsupported, One Foot in Front Able to plae foot ahead of the other  independently and hold 30 seconds   Standing on One Leg Tries to lift leg/unable to hold 3 seconds but remains standing independently   Total Score 48                PT Education - 10/25/15 1452    Education provided Yes   Education Details D/C from PT   Person(s) Educated Patient   Methods Explanation   Comprehension Verbalized understanding          PT Short Term Goals - 10/01/15 1423    PT SHORT TERM GOAL #1   Title verbalize understanding of CVA risk factors/warning signs to decrease risk of reinjury (09/18/15)   Status Achieved   PT SHORT TERM GOAL #2   Title improve BERG  balance score to >/= 38/56 for improved balance    Status Achieved   PT SHORT TERM GOAL #3   Title improve timed up and go to < 30 sec with LRAD for improved mobility and function   Status Achieved   PT SHORT TERM GOAL #4   Title ambulate > 250' with LRAD modified independent on indoor/paved outdoor surfaces for improved function   Status Partially Met   PT SHORT TERM GOAL #5   Title negotiate > 8 steps with 1 handrail and supervision for improved function and ability to negotiate stairs at beach house   Status Achieved           PT Long Term Goals - 10/25/15 [place] #1   Title independent with HEP (10/16/15)   Baseline Met; 10/21/13   Time 8   Period Weeks   Status Achieved   PT LONG TERM GOAL #2   Title improve BERG balance score to >/= 45/56 for decreased fall risk   Baseline 48/56 on 10/25/15   Status Achieved   PT LONG TERM GOAL #3   Title improve gait velocity to > 2.62 ft/sec for improved function and mobility   Baseline 3.4f/sec with cane 10/22/15   Status Achieved   PT LONG TERM GOAL #4   Title improve timed up and go with LRAD to < 25 sec for improved function and mobility   Baseline 12.5 sec no AD, 10/22/15   Status Achieved   PT LONG TERM GOAL #5   Title ambulate > 500' on various indoor/outdoor surfaces with LRAD modified independent for improved function and mobility.   Baseline Ambulated 600' with SPC over varying surfaces at mod I level                Plan - 10/25/15 1554    Clinical Impression Statement Skilled session focused on checking remaining goals for D/C from PT.  He has met remaining 2 goals for BERG balance test with score of 48/56 and mod I for indoor and outdoor surfaces.  Pt and wife verbalized understanding.     Rehab Potential Good   PT Frequency 2x / week   PT Duration 8 weeks   PT Treatment/Interventions ADLs/Self Care Home Management;Electrical Stimulation;Patient/family education;Neuromuscular re-education;Balance  training;Therapeutic exercise;Therapeutic activities;Functional mobility training;Stair training;Gait training;Vestibular   Consulted and Agree with Plan of Care Patient   Family Member Consulted wife      Patient will benefit from skilled therapeutic intervention in order to improve the following deficits and impairments:  Abnormal gait, Decreased balance, Decreased coordination, Decreased mobility, Postural dysfunction, Decreased strength, Decreased activity tolerance  Visit Diagnosis: Unsteadiness on feet  Other abnormalities of gait and mobility  G-Codes - 10/25/15 1555    Functional Assessment Tool Used 48/56 BERG   Functional Limitation Mobility: Walking and moving around   Mobility: Walking and Moving Around Current Status [phone]) At least 1 percent but less than 20 percent impaired, limited or restricted   Mobility: Walking and Moving Around Goal Status [phone]) At least 1 percent but less than 20 percent impaired, limited or restricted   Mobility: Walking and Moving Around Discharge Status [phone]) At least 1 percent but less than 20 percent impaired, limited or restricted      PHYSICAL THERAPY DISCHARGE SUMMARY  Visits from Start of Care: 15  Current functional level related to goals / functional outcomes: See LTG's above   Remaining deficits: Pt with high level balance deficits and requires SPC for safety with gait.    Education / Equipment: HEP  Plan: Patient agrees to discharge.  Patient goals were met. Patient is being discharged due to meeting the stated rehab goals.  ?????        Problem List Patient Active Problem List   Diagnosis Date Noted  . Brainstem infarct, acute ([place]) 09/17/2015  . 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 Obesity 09/17/2015  . 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 Gait disturbance, 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  . CVA (cerebral infarction) 08/09/2015    Cameron Sprang, PT, MPT [facility] [address] [place], [name], [address] Phone: [phone]   Fax:  [phone] 10/25/2015, 3:57 PM  Name: Jose Beck MRN: [id number] Date of Birth: 1945/06/06

## 2015-10-29 ENCOUNTER — Encounter: Payer: Self-pay | Admitting: Physical Medicine & Rehabilitation

## 2015-10-29 ENCOUNTER — Encounter: Payer: Medicare Other | Admitting: Speech Pathology

## 2015-10-29 ENCOUNTER — Ambulatory Visit: Payer: Medicare Other | Admitting: Speech Pathology

## 2015-10-29 ENCOUNTER — Encounter: Payer: Medicare Other | Admitting: Occupational Therapy

## 2015-10-29 ENCOUNTER — Ambulatory Visit: Payer: Medicare Other | Admitting: Rehabilitation

## 2015-10-29 DIAGNOSIS — R471 Dysarthria and anarthria: Secondary | ICD-10-CM

## 2015-10-29 DIAGNOSIS — R2689 Other abnormalities of gait and mobility: Secondary | ICD-10-CM | POA: Diagnosis not present

## 2015-10-29 DIAGNOSIS — R41844 Frontal lobe and executive function deficit: Secondary | ICD-10-CM | POA: Diagnosis not present

## 2015-10-29 DIAGNOSIS — R41841 Cognitive communication deficit: Secondary | ICD-10-CM

## 2015-10-29 DIAGNOSIS — R2681 Unsteadiness on feet: Secondary | ICD-10-CM | POA: Diagnosis not present

## 2015-10-29 DIAGNOSIS — R293 Abnormal posture: Secondary | ICD-10-CM | POA: Diagnosis not present

## 2015-10-29 DIAGNOSIS — G8191 Hemiplegia, unspecified affecting right dominant side: Secondary | ICD-10-CM | POA: Diagnosis not present

## 2015-10-29 DIAGNOSIS — M6281 Muscle weakness (generalized): Secondary | ICD-10-CM | POA: Diagnosis not present

## 2015-10-29 NOTE — Therapy (Signed)
[facility] [address] [place], [name], [address] Phone: [phone]   Fax:  [phone]  Speech Language Pathology Treatment  Patient Details  Name: Jose Beck MRN: [id number] Date of Birth: 1945/08/01 Referring Provider: Dr, Lupe Carney  Encounter Date: 10/29/2015      End of Session - 10/29/15 1159    Visit Number 17   Number of Visits 24   Date for SLP Re-Evaluation 11/30/15   SLP Start Time 1104   SLP Stop Time  1146   SLP Time Calculation (min) 42 min      Past Medical History  Diagnosis Date  . Stroke ([place])   . Hypertension   . Diabetes mellitus without complication ([place])   . Hyperlipemia   . Heart disease     No past surgical history on file.  There were no vitals filed for this visit.      Subjective Assessment - 10/29/15 1107    Subjective "I have to practice talking slow and big"   Currently in Pain? No/denies               ADULT SLP TREATMENT - 10/29/15 1108    General Information   Behavior/Cognition Alert;Cooperative;Pleasant mood   Treatment Provided   Treatment provided Cognitive-Linquistic   Cognitive-Linquistic Treatment   Treatment focused on Dysarthria   Skilled Treatment Speech tx: Pt perfomed HEP with min cues to slow rate. Simple conversation over 12 minutes with pt self corrected speech errors/slurs when rushes of speech occured. Cognitive tx: Facilitated compensations for recall of names using descriptive and association with occasional min to mod A.     Assessment / Recommendations / Plan   Plan Continue with current plan of care;Goals updated   Progression Toward Goals   Progression toward goals Progressing toward goals            SLP Short Term Goals - 10/04/15 1657    SLP SHORT TERM GOAL #1   Title Pt will perform dysarthria HEP with mod I   Status Not Met   SLP SHORT TERM GOAL #2   Title Pt will utilize compensations for dysarthira  during structured tasks with rare min A over two sessions   Baseline one session 09-18-15   Status Achieved   SLP SHORT TERM GOAL #3   Title Pt will demonstrate utterance length of 5-7 words during structured speech/descriptive tasks with rare min A   Status Achieved          SLP Long Term Goals - 10/29/15 1159    SLP LONG TERM GOAL #1   Title Pt will be 95% intellgible during simple conversation of 10 minutes with rare min A, over three sessions   Status Achieved   SLP LONG TERM GOAL #2   Title Pt will participate in mildly complex conversation over 12 minutes with less than 3 requests for clarification/repeair communication breakdown over 3 sessions   Baseline one session 10-15-15   Time 4   Period Weeks   Status On-going  not met; renewed for week of 10-29-15   SLP LONG TERM GOAL #3   Title Pt will utilize compensations for short term memory to recall details, lists, messages with 85% accuracy and rare min A.   Time 4   Period Weeks   Status Deferred   SLP LONG TERM GOAL #4   Title Pt will report utilization of compensations for short term memory in household cognitive-linguistic tasks   Time 4  Period Weeks   Status New  renewed for week of 10-29-15          Plan - 10/29/15 1157    Clinical Impression Statement Pt demonstrated improved awareness of speech errors, self correcting errors with rare min A. Conversational speech continues to exhibit reduced intellgiblity. Continue skilled ST to maximize intelligiblity and compensatoins for memory/word finding.    Speech Therapy Frequency 2x / week   Duration 4 weeks   Treatment/Interventions Cognitive reorganization;Internal/external aids;Compensatory strategies;SLP instruction and feedback;Patient/family education;Functional tasks;Language facilitation   Potential to Achieve Goals Good   Potential Considerations Ability to learn/carryover information   Consulted and Agree with Plan of Care Patient;Family member/caregiver    Family Member Consulted spouse      Patient will benefit from skilled therapeutic intervention in order to improve the following deficits and impairments:   Dysarthria and anarthria  Cognitive communication deficit    Problem List Patient Active Problem List   Diagnosis Date Noted  . Brainstem infarct, acute ([place]) 09/17/2015  . 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 Obesity 09/17/2015  . 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 Gait disturbance, 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  . CVA (cerebral infarction) 08/09/2015    [name], Annye Rusk  MS, CCC-SLP  10/29/2015, 12:00 PM  [facility] [address] [place], [name], [address] Phone: [phone]   Fax:  [phone]   Name: Jose Beck MRN: [id number] Date of Birth: 30-Mar-1946

## 2015-11-01 ENCOUNTER — Ambulatory Visit: Payer: Medicare Other | Admitting: Rehabilitation

## 2015-11-05 ENCOUNTER — Ambulatory Visit: Payer: Medicare Other | Admitting: Rehabilitation

## 2015-11-05 ENCOUNTER — Encounter: Payer: Medicare Other | Admitting: Occupational Therapy

## 2015-11-06 DIAGNOSIS — I10 Essential (primary) hypertension: Secondary | ICD-10-CM | POA: Diagnosis not present

## 2015-11-06 DIAGNOSIS — E1151 Type 2 diabetes mellitus with diabetic peripheral angiopathy without gangrene: Secondary | ICD-10-CM | POA: Diagnosis not present

## 2015-11-06 DIAGNOSIS — Z125 Encounter for screening for malignant neoplasm of prostate: Secondary | ICD-10-CM | POA: Diagnosis not present

## 2015-11-06 DIAGNOSIS — E784 Other hyperlipidemia: Secondary | ICD-10-CM | POA: Diagnosis not present

## 2015-11-08 ENCOUNTER — Ambulatory Visit: Payer: Medicare Other | Admitting: Rehabilitation

## 2015-11-08 ENCOUNTER — Ambulatory Visit: Payer: Medicare Other | Admitting: Speech Pathology

## 2015-11-08 DIAGNOSIS — R471 Dysarthria and anarthria: Secondary | ICD-10-CM

## 2015-11-08 DIAGNOSIS — M6281 Muscle weakness (generalized): Secondary | ICD-10-CM | POA: Diagnosis not present

## 2015-11-08 DIAGNOSIS — R293 Abnormal posture: Secondary | ICD-10-CM | POA: Diagnosis not present

## 2015-11-08 DIAGNOSIS — R41841 Cognitive communication deficit: Secondary | ICD-10-CM

## 2015-11-08 DIAGNOSIS — R2689 Other abnormalities of gait and mobility: Secondary | ICD-10-CM | POA: Diagnosis not present

## 2015-11-08 DIAGNOSIS — R41844 Frontal lobe and executive function deficit: Secondary | ICD-10-CM | POA: Diagnosis not present

## 2015-11-08 DIAGNOSIS — R2681 Unsteadiness on feet: Secondary | ICD-10-CM | POA: Diagnosis not present

## 2015-11-08 DIAGNOSIS — G8191 Hemiplegia, unspecified affecting right dominant side: Secondary | ICD-10-CM | POA: Diagnosis not present

## 2015-11-08 NOTE — Therapy (Signed)
[facility] [address] [place], [name], [address] Phone: [phone]   Fax:  [phone]  Speech Language Pathology Treatment  Patient Details  Name: Jose Beck MRN: [id number] Date of Birth: 1945/06/04 Referring Provider: Dr, Lupe Carney  Encounter Date: 11/08/2015      End of Session - 11/08/15 1013    Visit Number 18   Number of Visits 24   Date for SLP Re-Evaluation 11/30/15   SLP Start Time 0932   SLP Stop Time  1016   SLP Time Calculation (min) 44 min   Activity Tolerance Patient tolerated treatment well      Past Medical History  Diagnosis Date  . Stroke ([place])   . Hypertension   . Diabetes mellitus without complication ([place])   . Hyperlipemia   . Heart disease     No past surgical history on file.  There were no vitals filed for this visit.      Subjective Assessment - 11/08/15 0939    Subjective "He's becoming more concious about correcting his speech when he slurs"   Currently in Pain? No/denies               ADULT SLP TREATMENT - 11/08/15 0941    General Information   Behavior/Cognition Alert;Cooperative;Pleasant mood   Treatment Provided   Treatment provided Cognitive-Linquistic   Cognitive-Linquistic Treatment   Treatment focused on Dysarthria   Skilled Treatment Pt and spouse reported that pt. has not duplicated bill paying, that he has to ask his spouse before paying a bill. I instructed them to put a note on the computer to remind  pt to double check if bill has been paid.  Mildly complex conversation re:  plans for vacation and plans for reitrement community. I requested repeat twice for low frequency, proper nouns. Pt and spouse continue to report word finding issues - this was not appreicated during 20 min conversation.    Assessment / Recommendations / Plan   Plan Continue with current plan of care;Goals updated   Progression Toward Goals   Progression  toward goals Progressing toward goals          SLP Education - 11/08/15 1010    Education provided Yes   Education Details Sticky note on computer to remind to double check if bill has been paid; compensation for dysarthria   Person(s) Educated Patient;Spouse   Methods Explanation   Comprehension Verbalized understanding          SLP Short Term Goals - 11/08/15 1012    SLP SHORT TERM GOAL #1   Title Pt will perform dysarthria HEP with mod I   Status Not Met   SLP SHORT TERM GOAL #2   Title Pt will utilize compensations for dysarthira during structured tasks with rare min A over two sessions   Baseline one session 09-18-15   Status Achieved   SLP [place] #3   Title Pt will demonstrate utterance length of 5-7 words during structured speech/descriptive tasks with rare min A   Status Achieved          SLP Long Term Goals - 11/08/15 1012    SLP LONG TERM GOAL #1   Title Pt will be 95% intellgible during simple conversation of 10 minutes with rare min A, over three sessions   Baseline two sessions 10-01-15   Status Achieved   SLP LONG TERM GOAL #2   Title Pt will participate in mildly complex conversation over 12 minutes  with less than 3 requests for clarification/repeair communication breakdown over 3 sessions   Baseline one session 10-15-15, 11/08/15   Time 4   Period Weeks   Status On-going  not met; renewed for week of 10-29-15   SLP LONG TERM GOAL #3   Title Pt will utilize compensations for short term memory to recall details, lists, messages with 85% accuracy and rare min A.   Time 4   Period Weeks   Status Deferred   SLP LONG TERM GOAL #4   Title Pt will report utilization of compensations for short term memory in household cognitive-linguistic tasks   Time 4   Period Weeks   Status On-going  renewed for week of 10-29-15          Plan - 11/08/15 1011    Clinical Impression Statement Pt demonstrated improved awareness of speech errors, self correcting  errors with rare min A. Conversational speech continues to exhibit reduced intellgiblity. Continue skilled ST to maximize intelligiblity and compensatoins for memory/word finding.    Speech Therapy Frequency 2x / week   Duration --  3 weeks   Treatment/Interventions Cognitive reorganization;Internal/external aids;Compensatory strategies;SLP instruction and feedback;Patient/family education;Functional tasks;Language facilitation   Potential to Achieve Goals Good   Potential Considerations Ability to learn/carryover information   Consulted and Agree with Plan of Care Patient;Family member/caregiver      Patient will benefit from skilled therapeutic intervention in order to improve the following deficits and impairments:   Dysarthria and anarthria  Cognitive communication deficit    Problem List Patient Active Problem List   Diagnosis Date Noted  . Brainstem infarct, acute ([place]) 09/17/2015  . 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 Obesity 09/17/2015  . 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 Gait disturbance, 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  . CVA (cerebral infarction) 08/09/2015    [place], Annye Rusk MS, CCC-SLP 11/08/2015, 10:14 AM  [facility] [address] [place], [name], [address] Phone: [phone]   Fax:  [phone]   Name: Jose Beck MRN: [id number] Date of Birth: 01/02/1946

## 2015-11-09 ENCOUNTER — Ambulatory Visit: Payer: Medicare Other

## 2015-11-09 DIAGNOSIS — R293 Abnormal posture: Secondary | ICD-10-CM | POA: Diagnosis not present

## 2015-11-09 DIAGNOSIS — R2681 Unsteadiness on feet: Secondary | ICD-10-CM | POA: Diagnosis not present

## 2015-11-09 DIAGNOSIS — R471 Dysarthria and anarthria: Secondary | ICD-10-CM

## 2015-11-09 DIAGNOSIS — M6281 Muscle weakness (generalized): Secondary | ICD-10-CM | POA: Diagnosis not present

## 2015-11-09 DIAGNOSIS — R41844 Frontal lobe and executive function deficit: Secondary | ICD-10-CM | POA: Diagnosis not present

## 2015-11-09 DIAGNOSIS — G8191 Hemiplegia, unspecified affecting right dominant side: Secondary | ICD-10-CM | POA: Diagnosis not present

## 2015-11-09 DIAGNOSIS — R2689 Other abnormalities of gait and mobility: Secondary | ICD-10-CM | POA: Diagnosis not present

## 2015-11-09 NOTE — Therapy (Signed)
[facility] [address] [place], [name], [address] Phone: [phone]   Fax:  [phone]  Speech Language Pathology Treatment  Patient Details  Name: Jose Beck MRN: [id number] Date of Birth: 1945-12-14 Referring Provider: Dr, Lupe Carney  Encounter Date: 11/09/2015      End of Session - 11/09/15 1333    Visit Number 20   Number of Visits 24   Date for SLP Re-Evaluation 11/30/15   SLP Start Time 1148   SLP Stop Time  1230   SLP Time Calculation (min) 42 min   Activity Tolerance Patient tolerated treatment well      Past Medical History  Diagnosis Date  . Stroke ([place])   . Hypertension   . Diabetes mellitus without complication ([place])   . Hyperlipemia   . Heart disease     No past surgical history on file.  There were no vitals filed for this visit.      Subjective Assessment - 11/09/15 1152    Subjective "(My memory for names and places) is getting a little better but not much."               ADULT SLP TREATMENT - 11/09/15 1155    General Information   Behavior/Cognition Alert;Cooperative;Pleasant mood   Treatment Provided   Treatment provided Cognitive-Linquistic   Cognitive-Linquistic Treatment   Treatment focused on Dysarthria   Skilled Treatment Pt reports difficulty with memory is more an anomia for events, places, people's names in a long term memory circumstance. Short term memory is not affected. In 6 minutes conversation pt self corrected slurred speech x3, pt with 98% intelligibility. In multisentence tasks with focus on "slow and big" pt had very good success, with intelligibility 95-100%. In short conversation of 5-8 minutes regarding multisentence tasks pt maintained intelligibility nearly 100% and showed good awareness of more slurred speech necessitating repetition.    Assessment / Recommendations / Plan   Plan Continue with current plan of care   Progression  Toward Goals   Progression toward goals Progressing toward goals          SLP Education - 11/08/15 1010    Education provided Yes   Education Details Sticky note on computer to remind to double check if bill has been paid; compensation for dysarthria   Person(s) Educated Patient;Spouse   Methods Explanation   Comprehension Verbalized understanding          SLP Short Term Goals - 11/08/15 1012    SLP SHORT TERM GOAL #1   Title Pt will perform dysarthria HEP with mod I   Status Not Met   SLP SHORT TERM GOAL #2   Title Pt will utilize compensations for dysarthira during structured tasks with rare min A over two sessions   Baseline one session 09-18-15   Status Achieved   SLP [place] #3   Title Pt will demonstrate utterance length of 5-7 words during structured speech/descriptive tasks with rare min A   Status Achieved          SLP Long Term Goals - 11/09/15 1151    SLP LONG TERM GOAL #1   Title Pt will be 95% intellgible during simple conversation of 10 minutes with rare min A, over three sessions   Baseline two sessions 10-01-15   Status Achieved   SLP LONG TERM GOAL #2   Title Pt will participate in mildly complex conversation over 12 minutes with less than 3 requests for clarification/repeair communication  breakdown over 3 sessions   Baseline one session 10-15-15, 11/08/15   Time --   Period --   Status Achieved  not met; renewed for week of 10-29-15   SLP LONG TERM GOAL #3   Title Pt will utilize compensations for short term memory to recall details, lists, messages with 85% accuracy and rare min A.   Time --   Period --   Status Deferred  due to names of places and events in long term memory   SLP LONG TERM GOAL #4   Title Pt will report utilization of compensations for short term memory in household cognitive-linguistic tasks   Time 3   Period Weeks   Status Deferred  renewed for week of 10-29-15   SLP LONG TERM GOAL #5   Title pt will produce speech in  15 minutes mod complex conversation with self correction for intelligibility, over three sessions   Time 3   Period Weeks   Status New          Plan - November 29, 2015 1232    Clinical Impression Statement Pt demonstrated improved awareness of speech errors, self correcting errors with rare min A. Conversational speech now has limited reduced intellgiblity (>95% intelligibility). Continue skilled ST to maximize intelligiblity. SLP learned memory/word finding is mostly for people's names and places.     Speech Therapy Frequency 2x / week   Duration --  3 weeks   Treatment/Interventions Cognitive reorganization;Internal/external aids;Compensatory strategies;SLP instruction and feedback;Patient/family education;Functional tasks;Language facilitation   Potential to Achieve Goals Good   Potential Considerations Ability to learn/carryover information   Consulted and Agree with Plan of Care Patient;Family member/caregiver      Patient will benefit from skilled therapeutic intervention in order to improve the following deficits and impairments:   Dysarthria and anarthria      G-Codes - 11-29-2015 1338    Functional Assessment Tool Used NOMS   Functional Limitations Motor speech   Motor Speech Current Status [phone]) At least 1 percent but less than 20 percent impaired, limited or restricted   Motor Speech Goal Status (S5053) At least 1 percent but less than 20 percent impaired, limited or restricted     Speech Therapy Progress Note  Dates of Reporting Period: 08-28-15 to present  Objective Reports of Subjective Statement: Pt has been seen for 20 sessions targeting primarily speech intelligibility. Pt's compensations for cognitive-linguistics have also been targeted. Pt's anomia to this point is mainly with people's names, and with places.  Objective Measurements: Pt's ability to self correct has improved since last progress note. Additionally, pt has been encouraged to use a memory book for names  and places. Lastly, pt's intelligibility has improved in simple and mod complex conversation. He rarely needs SLP to cue him to repeat during therapy sessions  Goal Update: See above  Plan: See pt for approx three more sessions then d/c.  Reason Skilled Services are Required: Ensure consistency over time with pt's compensations for intelligibility.    Problem List Patient Active Problem List   Diagnosis Date Noted  . Brainstem infarct, acute ([place]) 09/17/2015  . 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 Obesity 09/17/2015  . 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 Gait disturbance, 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  . CVA (cerebral infarction) 08/09/2015    [facility] ,MS, [place]  Nov 29, 2015, 1:39 PM  [facility] [address] [place], [name], [address] Phone: [phone]   Fax:  [phone]   Name: Jose  Dakhari Beck MRN: [id number] Date of Birth: [birth year]

## 2015-11-09 NOTE — Patient Instructions (Signed)
Talk big and slow when you are with anyone else, including [name].

## 2015-11-10 DIAGNOSIS — D696 Thrombocytopenia, unspecified: Secondary | ICD-10-CM

## 2015-11-10 HISTORY — DX: Thrombocytopenia, unspecified: D69.6

## 2015-11-12 DIAGNOSIS — I69320 Aphasia following cerebral infarction: Secondary | ICD-10-CM | POA: Diagnosis not present

## 2015-11-12 DIAGNOSIS — E669 Obesity, unspecified: Secondary | ICD-10-CM | POA: Diagnosis not present

## 2015-11-12 DIAGNOSIS — Z6836 Body mass index (BMI) 36.0-36.9, adult: Secondary | ICD-10-CM | POA: Diagnosis not present

## 2015-11-12 DIAGNOSIS — E1151 Type 2 diabetes mellitus with diabetic peripheral angiopathy without gangrene: Secondary | ICD-10-CM | POA: Diagnosis not present

## 2015-11-12 DIAGNOSIS — M1990 Unspecified osteoarthritis, unspecified site: Secondary | ICD-10-CM | POA: Diagnosis not present

## 2015-11-12 DIAGNOSIS — E784 Other hyperlipidemia: Secondary | ICD-10-CM | POA: Diagnosis not present

## 2015-11-12 DIAGNOSIS — I10 Essential (primary) hypertension: Secondary | ICD-10-CM | POA: Diagnosis not present

## 2015-11-12 DIAGNOSIS — I2510 Atherosclerotic heart disease of native coronary artery without angina pectoris: Secondary | ICD-10-CM | POA: Diagnosis not present

## 2015-11-12 DIAGNOSIS — Z0000 Encounter for general adult medical examination without abnormal findings: Secondary | ICD-10-CM | POA: Diagnosis not present

## 2015-11-12 DIAGNOSIS — I69959 Hemiplegia and hemiparesis following unspecified cerebrovascular disease affecting unspecified side: Secondary | ICD-10-CM | POA: Diagnosis not present

## 2015-11-12 DIAGNOSIS — Z1389 Encounter for screening for other disorder: Secondary | ICD-10-CM | POA: Diagnosis not present

## 2015-11-12 DIAGNOSIS — N401 Enlarged prostate with lower urinary tract symptoms: Secondary | ICD-10-CM | POA: Diagnosis not present

## 2015-11-14 DIAGNOSIS — D225 Melanocytic nevi of trunk: Secondary | ICD-10-CM | POA: Diagnosis not present

## 2015-11-14 DIAGNOSIS — D1800 Hemangioma unspecified site: Secondary | ICD-10-CM | POA: Diagnosis not present

## 2015-11-14 DIAGNOSIS — L814 Other melanin hyperpigmentation: Secondary | ICD-10-CM | POA: Diagnosis not present

## 2015-11-14 DIAGNOSIS — Z85828 Personal history of other malignant neoplasm of skin: Secondary | ICD-10-CM | POA: Diagnosis not present

## 2015-11-14 DIAGNOSIS — L821 Other seborrheic keratosis: Secondary | ICD-10-CM | POA: Diagnosis not present

## 2015-11-14 DIAGNOSIS — C44229 Squamous cell carcinoma of skin of left ear and external auricular canal: Secondary | ICD-10-CM | POA: Diagnosis not present

## 2015-11-14 DIAGNOSIS — D485 Neoplasm of uncertain behavior of skin: Secondary | ICD-10-CM | POA: Diagnosis not present

## 2015-11-14 DIAGNOSIS — Z86018 Personal history of other benign neoplasm: Secondary | ICD-10-CM | POA: Diagnosis not present

## 2015-11-15 ENCOUNTER — Ambulatory Visit: Payer: Medicare Other | Admitting: Rehabilitation

## 2015-11-16 ENCOUNTER — Encounter (HOSPITAL_COMMUNITY): Payer: Self-pay | Admitting: Emergency Medicine

## 2015-11-16 ENCOUNTER — Encounter (HOSPITAL_COMMUNITY)
Admission: EM | Disposition: A | Discharge status: Home / Self Care | Payer: Self-pay | Source: Home / Self Care | Attending: Emergency Medicine

## 2015-11-16 ENCOUNTER — Observation Stay (HOSPITAL_BASED_OUTPATIENT_CLINIC_OR_DEPARTMENT_OTHER): Payer: Medicare Other

## 2015-11-16 ENCOUNTER — Encounter: Payer: Self-pay | Admitting: Internal Medicine

## 2015-11-16 ENCOUNTER — Ambulatory Visit: Payer: Medicare Other

## 2015-11-16 ENCOUNTER — Emergency Department (HOSPITAL_COMMUNITY): Payer: Medicare Other

## 2015-11-16 ENCOUNTER — Ambulatory Visit: Payer: Medicare Other | Admitting: Rehabilitation

## 2015-11-16 ENCOUNTER — Observation Stay (HOSPITAL_COMMUNITY)
Admission: EM | Admit: 2015-11-16 | Discharge: 2015-11-16 | Disposition: A | Discharge status: Home / Self Care | Payer: Medicare Other | Attending: Internal Medicine | Admitting: Internal Medicine

## 2015-11-16 DIAGNOSIS — D72819 Decreased white blood cell count, unspecified: Secondary | ICD-10-CM | POA: Diagnosis not present

## 2015-11-16 DIAGNOSIS — E871 Hypo-osmolality and hyponatremia: Secondary | ICD-10-CM | POA: Diagnosis not present

## 2015-11-16 DIAGNOSIS — I2582 Chronic total occlusion of coronary artery: Secondary | ICD-10-CM | POA: Diagnosis not present

## 2015-11-16 DIAGNOSIS — R079 Chest pain, unspecified: Secondary | ICD-10-CM | POA: Diagnosis present

## 2015-11-16 DIAGNOSIS — D696 Thrombocytopenia, unspecified: Secondary | ICD-10-CM | POA: Diagnosis present

## 2015-11-16 DIAGNOSIS — I25118 Atherosclerotic heart disease of native coronary artery with other forms of angina pectoris: Secondary | ICD-10-CM | POA: Diagnosis not present

## 2015-11-16 DIAGNOSIS — R0789 Other chest pain: Secondary | ICD-10-CM | POA: Diagnosis not present

## 2015-11-16 DIAGNOSIS — E785 Hyperlipidemia, unspecified: Secondary | ICD-10-CM | POA: Insufficient documentation

## 2015-11-16 DIAGNOSIS — Z1212 Encounter for screening for malignant neoplasm of rectum: Secondary | ICD-10-CM | POA: Diagnosis not present

## 2015-11-16 DIAGNOSIS — I209 Angina pectoris, unspecified: Secondary | ICD-10-CM

## 2015-11-16 DIAGNOSIS — E1159 Type 2 diabetes mellitus with other circulatory complications: Secondary | ICD-10-CM

## 2015-11-16 DIAGNOSIS — Z794 Long term (current) use of insulin: Secondary | ICD-10-CM

## 2015-11-16 DIAGNOSIS — I10 Essential (primary) hypertension: Secondary | ICD-10-CM | POA: Diagnosis not present

## 2015-11-16 DIAGNOSIS — I110 Hypertensive heart disease with heart failure: Secondary | ICD-10-CM | POA: Diagnosis not present

## 2015-11-16 DIAGNOSIS — I2510 Atherosclerotic heart disease of native coronary artery without angina pectoris: Secondary | ICD-10-CM | POA: Insufficient documentation

## 2015-11-16 DIAGNOSIS — I5042 Chronic combined systolic (congestive) and diastolic (congestive) heart failure: Secondary | ICD-10-CM | POA: Diagnosis not present

## 2015-11-16 DIAGNOSIS — Z7982 Long term (current) use of aspirin: Secondary | ICD-10-CM | POA: Diagnosis not present

## 2015-11-16 DIAGNOSIS — Z7902 Long term (current) use of antithrombotics/antiplatelets: Secondary | ICD-10-CM | POA: Insufficient documentation

## 2015-11-16 DIAGNOSIS — Z8673 Personal history of transient ischemic attack (TIA), and cerebral infarction without residual deficits: Secondary | ICD-10-CM

## 2015-11-16 DIAGNOSIS — I200 Unstable angina: Secondary | ICD-10-CM

## 2015-11-16 HISTORY — DX: Atherosclerotic heart disease of native coronary artery without angina pectoris: I25.10

## 2015-11-16 HISTORY — DX: Thrombocytopenia, unspecified: D69.6

## 2015-11-16 HISTORY — DX: Gastro-esophageal reflux disease without esophagitis: K21.9

## 2015-11-16 HISTORY — PX: CARDIAC CATHETERIZATION: SHX172

## 2015-11-16 HISTORY — DX: Chronic combined systolic (congestive) and diastolic (congestive) heart failure: I50.42

## 2015-11-16 LAB — BASIC METABOLIC PANEL
Anion gap: 11 (ref 5–15)
BUN: 13 mg/dL (ref 6–20)
CHLORIDE: 101 mmol/L (ref 101–111)
CO2: 21 mmol/L — AB (ref 22–32)
CREATININE: 0.76 mg/dL (ref 0.61–1.24)
Calcium: 8.9 mg/dL (ref 8.9–10.3)
GFR calc Af Amer: >60 mL/min (ref >60)
GFR calc non Af Amer: >60 mL/min (ref >60)
GLUCOSE: 136 mg/dL — AB (ref 65–99)
POTASSIUM: 3.7 mmol/L (ref 3.5–5.1)
SODIUM: 133 mmol/L — AB (ref 135–145)

## 2015-11-16 LAB — CREATININE, SERUM
Creatinine, Ser: 0.65 mg/dL (ref 0.61–1.24)
GFR calc Af Amer: >60 mL/min (ref >60)
GFR calc non Af Amer: >60 mL/min (ref >60)

## 2015-11-16 LAB — CBC
HCT: 42.1 % (ref 39.0–52.0)
HEMATOCRIT: 41.1 % (ref 39.0–52.0)
Hemoglobin: 14.4 g/dL (ref 13.0–17.0)
Hemoglobin: 14.8 g/dL (ref 13.0–17.0)
MCH: 32.7 pg (ref 26.0–34.0)
MCH: 33 pg (ref 26.0–34.0)
MCHC: 35 g/dL (ref 30.0–36.0)
MCHC: 35.2 g/dL (ref 30.0–36.0)
MCV: 93.4 fL (ref 78.0–100.0)
MCV: 94 fL (ref 78.0–100.0)
PLATELETS: 84 10*3/uL — AB (ref 150–400)
PLATELETS: 107 10*3/uL — AB (ref 150–400)
RBC: 4.4 MIL/uL (ref 4.22–5.81)
RBC: 4.48 MIL/uL (ref 4.22–5.81)
RDW: 13.1 % (ref 11.5–15.5)
RDW: 13 % (ref 11.5–15.5)
WBC: 3.1 10*3/uL — AB (ref 4.0–10.5)
WBC: 4.4 10*3/uL (ref 4.0–10.5)

## 2015-11-16 LAB — ECHOCARDIOGRAM COMPLETE
HEIGHTINCHES: 70 in
WEIGHTICAEL: 4025.6 [oz_av]

## 2015-11-16 LAB — MAGNESIUM: Magnesium: 1.8 mg/dL (ref 1.7–2.4)

## 2015-11-16 LAB — LIPID PANEL
CHOL/HDL RATIO: 3.3 ratio
CHOLESTEROL: 151 mg/dL (ref 0–200)
HDL: 46 mg/dL (ref >40)
LDL Cholesterol: 94 mg/dL (ref 0–99)
TRIGLYCERIDES: 57 mg/dL (ref <150)
VLDL: 11 mg/dL (ref 0–40)

## 2015-11-16 LAB — I-STAT TROPONIN, ED: Troponin i, poc: 0.01 ng/mL (ref 0.00–0.08)

## 2015-11-16 LAB — PROTIME-INR
INR: 1.13 (ref 0.00–1.49)
Prothrombin Time: 14.7 seconds (ref 11.6–15.2)

## 2015-11-16 LAB — TROPONIN I: Troponin I: <0.03 ng/mL (ref <0.03)

## 2015-11-16 LAB — GLUCOSE, CAPILLARY: Glucose-Capillary: 180 mg/dL — ABNORMAL HIGH (ref 65–99)

## 2015-11-16 LAB — PLATELET INHIBITION P2Y12: PLATELET FUNCTION P2Y12: 82 [PRU] — AB (ref 194–418)

## 2015-11-16 LAB — OSMOLALITY: OSMOLALITY: 281 mosm/kg (ref 275–295)

## 2015-11-16 SURGERY — LEFT HEART CATH AND CORONARY ANGIOGRAPHY
Anesthesia: LOCAL

## 2015-11-16 MED ORDER — IOPAMIDOL (ISOVUE-370) INJECTION 76%
INTRAVENOUS | Status: DC | PRN
  Administered 2015-11-16: 130 mL

## 2015-11-16 MED ORDER — LOSARTAN POTASSIUM 25 MG PO TABS: 25.0000 mg | ORAL_TABLET | Freq: Every day | ORAL | Status: DC

## 2015-11-16 MED ORDER — METOPROLOL TARTRATE 25 MG PO TABS: 25.0000 mg | ORAL_TABLET | Freq: Two times a day (BID) | ORAL | Status: DC

## 2015-11-16 MED ORDER — VERAPAMIL HCL 2.5 MG/ML IV SOLN
INTRAVENOUS | Status: AC
Start: 2015-11-16 — End: 2015-11-16
  Filled 2015-11-16: qty 2

## 2015-11-16 MED ORDER — FENTANYL CITRATE (PF) 100 MCG/2ML IJ SOLN
INTRAMUSCULAR | Status: DC | PRN
  Administered 2015-11-16: 50 ug via INTRAVENOUS

## 2015-11-16 MED ORDER — CARVEDILOL 6.25 MG PO TABS: 6.2500 mg | ORAL_TABLET | Freq: Two times a day (BID) | ORAL | Status: DC

## 2015-11-16 MED ORDER — CARVEDILOL 3.125 MG PO TABS: 3.1250 mg | ORAL_TABLET | Freq: Two times a day (BID) | ORAL | Status: DC

## 2015-11-16 MED ORDER — HEPARIN (PORCINE) IN NACL 2-0.9 UNIT/ML-% IJ SOLN
INTRAMUSCULAR | Status: DC | PRN
  Administered 2015-11-16: 15:00:00

## 2015-11-16 MED ORDER — VERAPAMIL HCL 2.5 MG/ML IV SOLN
INTRAVENOUS | Status: DC | PRN
  Administered 2015-11-16: 10 mL via INTRA_ARTERIAL

## 2015-11-16 MED ORDER — LORAZEPAM 1 MG PO TABS: 1.0000 mg | ORAL_TABLET | Freq: Four times a day (QID) | ORAL | Status: DC | PRN

## 2015-11-16 MED ORDER — ASPIRIN 81 MG PO CHEW
81.0000 mg | CHEWABLE_TABLET | ORAL | Status: AC
  Administered 2015-11-16: 81 mg via ORAL

## 2015-11-16 MED ORDER — SODIUM CHLORIDE 0.9 % WEIGHT BASED INFUSION: 1.0000 mL/kg/h | INTRAVENOUS | Status: DC

## 2015-11-16 MED ORDER — HEPARIN SODIUM (PORCINE) 1000 UNIT/ML IJ SOLN
INTRAMUSCULAR | Status: AC
  Filled 2015-11-16: qty 1

## 2015-11-16 MED ORDER — LIDOCAINE HCL (PF) 1 % IJ SOLN
INTRAMUSCULAR | Status: AC
  Filled 2015-11-16: qty 30

## 2015-11-16 MED ORDER — SODIUM CHLORIDE 0.9 % IV SOLN: 250.0000 mL | INTRAVENOUS | Status: DC | PRN

## 2015-11-16 MED ORDER — FOLIC ACID 1 MG PO TABS
1.0000 mg | ORAL_TABLET | Freq: Every day | ORAL | Status: DC
  Administered 2015-11-16: 1 mg via ORAL
  Filled 2015-11-16: qty 1

## 2015-11-16 MED ORDER — MIDAZOLAM HCL 2 MG/2ML IJ SOLN
INTRAMUSCULAR | Status: AC
  Filled 2015-11-16: qty 2

## 2015-11-16 MED ORDER — SODIUM CHLORIDE 0.9% FLUSH: 3.0000 mL | INTRAVENOUS | Status: DC | PRN

## 2015-11-16 MED ORDER — ONDANSETRON HCL 4 MG/2ML IJ SOLN: 4.0000 mg | Freq: Four times a day (QID) | INTRAMUSCULAR | Status: DC | PRN

## 2015-11-16 MED ORDER — LIDOCAINE HCL (PF) 1 % IJ SOLN
INTRAMUSCULAR | Status: DC | PRN
  Administered 2015-11-16: 2 mL

## 2015-11-16 MED ORDER — CARVEDILOL 6.25 MG PO TABS
6.2500 mg | ORAL_TABLET | Freq: Two times a day (BID) | ORAL | Status: DC
  Administered 2015-11-16: 6.25 mg via ORAL
  Filled 2015-11-16: qty 1

## 2015-11-16 MED ORDER — INSULIN DETEMIR 100 UNIT/ML ~~LOC~~ SOLN
10.0000 [IU] | Freq: Every day | SUBCUTANEOUS | Status: DC
  Filled 2015-11-16: qty 0.1

## 2015-11-16 MED ORDER — THIAMINE HCL 100 MG PO TABS: 100.0000 mg | ORAL_TABLET | Freq: Every day | ORAL | Status: DC

## 2015-11-16 MED ORDER — ASPIRIN EC 81 MG PO TBEC: 81.0000 mg | DELAYED_RELEASE_TABLET | Freq: Every day | ORAL | Status: DC

## 2015-11-16 MED ORDER — HEPARIN SODIUM (PORCINE) 5000 UNIT/ML IJ SOLN: 5000.0000 [IU] | Freq: Three times a day (TID) | INTRAMUSCULAR | Status: DC

## 2015-11-16 MED ORDER — IOPAMIDOL (ISOVUE-370) INJECTION 76%
INTRAVENOUS | Status: AC
  Filled 2015-11-16: qty 50

## 2015-11-16 MED ORDER — FENTANYL CITRATE (PF) 100 MCG/2ML IJ SOLN
INTRAMUSCULAR | Status: AC
  Filled 2015-11-16: qty 2

## 2015-11-16 MED ORDER — CLOPIDOGREL BISULFATE 75 MG PO TABS
75.0000 mg | ORAL_TABLET | Freq: Every day | ORAL | Status: DC
  Administered 2015-11-16: 75 mg via ORAL
  Filled 2015-11-16: qty 1

## 2015-11-16 MED ORDER — ASPIRIN 81 MG PO CHEW: 81.0000 mg | CHEWABLE_TABLET | Freq: Every day | ORAL | Status: DC

## 2015-11-16 MED ORDER — HEPARIN (PORCINE) IN NACL 2-0.9 UNIT/ML-% IJ SOLN
INTRAMUSCULAR | Status: AC
  Filled 2015-11-16: qty 1500

## 2015-11-16 MED ORDER — IOPAMIDOL (ISOVUE-370) INJECTION 76%
INTRAVENOUS | Status: AC
  Filled 2015-11-16: qty 100

## 2015-11-16 MED ORDER — MIDAZOLAM HCL 2 MG/2ML IJ SOLN
INTRAMUSCULAR | Status: DC | PRN
  Administered 2015-11-16: 1 mg via INTRAVENOUS

## 2015-11-16 MED ORDER — ASPIRIN 81 MG PO CHEW
81.0000 mg | CHEWABLE_TABLET | Freq: Every day | ORAL | Status: DC
  Filled 2015-11-16: qty 1

## 2015-11-16 MED ORDER — ACETAMINOPHEN 325 MG PO TABS: 650.0000 mg | ORAL_TABLET | ORAL | Status: DC | PRN

## 2015-11-16 MED ORDER — GI COCKTAIL ~~LOC~~: 30.0000 mL | Freq: Four times a day (QID) | ORAL | Status: DC | PRN

## 2015-11-16 MED ORDER — SODIUM CHLORIDE 0.9 % IV SOLN: INTRAVENOUS | Status: DC

## 2015-11-16 MED ORDER — INSULIN ASPART 100 UNIT/ML ~~LOC~~ SOLN: 0.0000 [IU] | Freq: Four times a day (QID) | SUBCUTANEOUS | Status: DC

## 2015-11-16 MED ORDER — LORAZEPAM 2 MG/ML IJ SOLN: 1.0000 mg | Freq: Four times a day (QID) | INTRAMUSCULAR | Status: DC | PRN

## 2015-11-16 MED ORDER — ATORVASTATIN CALCIUM 40 MG PO TABS: 40.0000 mg | ORAL_TABLET | Freq: Every day | ORAL | Status: DC

## 2015-11-16 MED ORDER — HEPARIN SODIUM (PORCINE) 1000 UNIT/ML IJ SOLN
INTRAMUSCULAR | Status: DC | PRN
  Administered 2015-11-16: 5000 [IU] via INTRAVENOUS

## 2015-11-16 MED ORDER — SODIUM CHLORIDE 0.9% FLUSH: 3.0000 mL | Freq: Two times a day (BID) | INTRAVENOUS | Status: DC

## 2015-11-16 MED ORDER — NITROGLYCERIN 0.4 MG SL SUBL: 0.4000 mg | SUBLINGUAL_TABLET | SUBLINGUAL | Status: DC | PRN

## 2015-11-16 MED ORDER — ADULT MULTIVITAMIN W/MINERALS CH
1.0000 | ORAL_TABLET | Freq: Every day | ORAL | Status: DC
  Administered 2015-11-16: 1 via ORAL
  Filled 2015-11-16: qty 1

## 2015-11-16 MED ORDER — VITAMIN B-1 100 MG PO TABS
100.0000 mg | ORAL_TABLET | Freq: Every day | ORAL | Status: DC
  Administered 2015-11-16: 100 mg via ORAL
  Filled 2015-11-16: qty 1

## 2015-11-16 MED ORDER — DILTIAZEM HCL ER COATED BEADS 180 MG PO CP24
360.0000 mg | ORAL_CAPSULE | Freq: Every day | ORAL | Status: DC
  Administered 2015-11-16: 360 mg via ORAL
  Filled 2015-11-16: qty 2

## 2015-11-16 MED ORDER — THIAMINE HCL 100 MG/ML IJ SOLN
100.0000 mg | Freq: Every day | INTRAMUSCULAR | Status: DC
  Filled 2015-11-16: qty 2

## 2015-11-16 SURGICAL SUPPLY — 14 items
CATH INFINITI 5 FR JL3.5 (CATHETERS) ×1 IMPLANT
CATH INFINITI 5FR JL4 (CATHETERS) ×1 IMPLANT
CATH INFINITI JR4 5F (CATHETERS) ×1 IMPLANT
CATH LAUNCHER 5F EBU3.0 (CATHETERS) IMPLANT
CATHETER LAUNCHER 5F EBU3.0 (CATHETERS) ×2
DEVICE RAD COMP TR BAND LRG (VASCULAR PRODUCTS) ×1 IMPLANT
GLIDESHEATH SLEND A-KIT 6F 22G (SHEATH) ×1 IMPLANT
GUIDEWIRE ANGLED .035X150CM (WIRE) ×1 IMPLANT
KIT HEART LEFT (KITS) ×2 IMPLANT
PACK CARDIAC CATHETERIZATION (CUSTOM PROCEDURE TRAY) ×2 IMPLANT
TRANSDUCER W/STOPCOCK (MISCELLANEOUS) ×2 IMPLANT
TUBING CIL FLEX 10 FLL-RA (TUBING) ×2 IMPLANT
WIRE HI TORQ VERSACORE-J 145CM (WIRE) ×1 IMPLANT
WIRE SAFE-T 1.5MM-J .035X260CM (WIRE) ×1 IMPLANT

## 2015-11-16 NOTE — Progress Notes (Signed)
  Echocardiogram 2D Echocardiogram has been performed.  Jose Beck 11/16/2015, 8:46 AM

## 2015-11-16 NOTE — ED Notes (Signed)
Patient transported to X-ray 

## 2015-11-16 NOTE — H&P (Signed)
History and Physical  Patient Name: Jose Beck     [id number]    DOB: May 03, 1946    DOA: 11/16/2015 PCP: Jose Lyons, MD  Cardiology: Wynonia Lawman Neurology: Leonie Man   Patient coming from: Home     Chief Complaint: Chest pain  HPI: Jose Beck is a 70 y.o. male with a past medical history significant for CAD diffuse, no previous CABG, recent CVA, HTN, and IDDM who presents with chest pain.  The pain woke this morning at 3:30 AM to go to the bathroom, and developed sudden onset chest pain. This was moderate in intensity, dull, central, and constant. His wife called 9-1-1 who recommended he administered aspirin 325 which she did. When EMS arrived they gave him nitroglycerin which eased the pain.   ED course: -Afebrile, tachycardic, hypertensive, pain had resolved -Initial ECG showed sinus tachycardia with evidence of old infarction and troponin was negative. -Na 133, K 3.7, Cr 0.76, WBC 3.1, Hgb normal, platelets 84K -TRH was asked to admit for observation, serial troponins and risk stratification.     Review of Systems:  Pt complains of dull chest pain. All other systems negative except as just noted or noted in the history of present illness.  Past Medical History  Diagnosis Date  . Stroke ([place])   . Hypertension   . Diabetes mellitus without complication ([place])   . Hyperlipemia   . Heart disease     History reviewed. No pertinent past surgical history.  Social History: Patient lives with his wife. He is from [place], went to [employer] in [place] at [place], worked for [employer].  Non-smoker.  Drinks 2-3 spirits drinks per day, more per wife.  Allergies  Allergen Reactions  . Cheese Nausea And Vomiting  . Pravastatin     Anxiety attack    Family history: Mother and father are deceased, no first degree relatives with coronary disease, liver disease, blood dyscrasias, cancer.  Prior to Admission medications   Medication Sig Start Date End Date  Taking? Authorizing Provider  aspirin 81 MG chewable tablet Chew 1 tablet (81 mg total) by mouth daily. 08/17/15   [name] Angiulli, PA-C  atorvastatin (LIPITOR) 40 MG tablet Take 1 tablet (40 mg total) by mouth daily at 6 PM. 08/17/15   [name] Angiulli, PA-C  clopidogrel (PLAVIX) 75 MG tablet Take 1 tablet (75 mg total) by mouth daily. 08/17/15   [name] Angiulli, PA-C  diltiazem (CARDIZEM CD) 360 MG 24 hr capsule Take 1 capsule (360 mg total) by mouth daily. 08/17/15   [name] Angiulli, PA-C  insulin detemir (LEVEMIR) 100 UNIT/ML injection Inject 0.22 mLs (22 Units total) into the skin 2 (two) times daily. 08/17/15   [name] Angiulli, PA-C  terbinafine (LAMISIL) 250 MG tablet Take 250 mg by mouth daily. Reported on 08/21/2015 07/25/15   Historical Provider, MD       Physical Exam: BP 164/81 mmHg  Pulse 86  Temp(Src) 98.3 F (36.8 C) (Oral)  Resp 13  SpO2 99% General appearance: Well-developed, obese adult male, alert and in no acute distress.   Eyes: Anicteric, conjunctiva pink, lids and lashes normal.     ENT: No nasal deformity, discharge, or epistaxis.  OP moist without lesions.   Skin: Warm and dry.   Cardiac: RRR, nl S1-S2, no murmurs appreciated.  Capillary refill is brisk.  JVP normal.  No LE edema.  Radial and DP pulses 2+ and symmetric.  No carotid bruits. Respiratory: Normal respiratory rate and rhythm.  CTAB without rales or wheezes. GI: Abdomen soft without rigidity.  No TTP. No ascites, distension.   MSK: No deformities or effusions.   Pain not reproduced with palpation of precordium.  No pain with arm movement. Neuro: Sensorium intact and responding to questions, attention normal.  Speech is slightly halting but not dysarthric.  Right sided contractures mild.   Psych: Behavior appropriate.  Affect blunted.  No evidence of aural or visual hallucinations or delusions.       Labs on Admission:  The metabolic panel shows Hyponatremia, otherwise normal electrolyte and renal  function. The complete blood count shows leukopenia and thrombocytoepnia, chronic, without anemia. The initial troponin is negative.  Radiological Exams on Admission: Personally reviewed: Dg Chest 2 View  11/16/2015  CLINICAL DATA:  Acute onset of generalized chest pain. Initial encounter. EXAM: CHEST  2 VIEW COMPARISON:  Chest radiograph performed 06/28/2005 FINDINGS: The lungs are well-aerated and clear. There is no evidence of focal opacification, pleural effusion or pneumothorax. The heart is normal in size; the mediastinal contour is within normal limits. No acute osseous abnormalities are seen. IMPRESSION: No acute cardiopulmonary process seen. Electronically Signed   By: Garald Balding M.D.   On: 11/16/2015 05:10    EKG: Independently reviewed. Rate 98, QTC 496, no previous for comparison, old anterior infarction, no ST segment changes.    Assessment/Plan 1. Chest pain: Reports diffuse CAD managed without CABG diagnosed in mid-90s (when patient was in his 3s).  Somewhat surprisingly, no follow up with Cardiology since [place].  On statin, aspirin, Plavix, no BB or ACEi.   -Serial troponins are ordered -Telemetry -Echocardiogram ordered -Check lipids and HgbA1c -Start beta-blocker -Consult to cardiology, appreciate recommendations    2. Thrombocytopenia and leukopenia:  This is apparent since patient's stroke in our records, but family and patient unaware that he has ever had abnormal blood counts.  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  3. Hyponatremia: Appears euvolemic to hypervolemic. -Check free water clearance  3. HTN:  -Continue diltiazem for now -Will start metoprolol 25 BID  4. CVA secondary prevention:  -Continue Plavix and atorvastatin  5. IDDM:  -Check HgbA1c -Levemir 10 units daily while NPO (22 units daily usually) -SSI q6hrs low dose  6. Alcohol use: -CIWA protocol    DVT prophylaxis: SCDs given  platelets Diet: NPO for anticipated testing Code Status: Full  Family Communication: Wife at bedside  Disposition Plan: Anticipate observation for arrhythmia on telemetry, serial troponins and subsequent risk stratification by Cardiology.  If testing negative, home after with Cardiology follow up. Consults called: Cardiology Admission status: Telemetry, OBS   Medical decision making: Patient seen at 6:06 AM on 11/16/2015.  The patient was discussed with Dr. Venora Maples. What exists of the patient's chart was reviewed in depth.  Clinical condition: stable.      Edwin Dada [facility] Pager [phone]

## 2015-11-16 NOTE — ED Notes (Signed)
Pt back in room.

## 2015-11-16 NOTE — ED Provider Notes (Signed)
CSN: [id number]     Arrival date & time 11/16/15  0415 History   First MD Initiated Contact with Patient 11/16/15 0445     Chief Complaint  Patient presents with  . Chest Pain      HPI Patient has a history of coronary disease and presents to the emergency Department with complaints of anterior chest pain which awoke him from sleep this morning. He states that his chest discomfort resolved after treatment with aspirin and nitroglycerin by EMS. He has had complete resolution of his symptoms is still having chest discomfort at this time. He reports it did feel somewhat like his prior indigestion but the chest discomfort made him anxious and he wanted to be sure that he was all right. He does have a history of known arterial disease and prior strokes. He states that in the late 90s he had a heart catheterization which demonstrated completely 100% occlusions of coronary arteries but with good collateral flow at that time and therefore he has been managed medically since then. He states it's been 6-7 years since he last saw cardiology. He has not had a recent stress test or repeat heart catheterization. Recent fevers or chills. Denies productive cough. Denies abdominal pain or back pain. He is somewhat sedentary and does not do much activity that raises his heart except for some occasional balance exercises.    Past Medical History  Diagnosis Date  . Stroke ([facility])   . Hypertension   . Diabetes mellitus without complication ([place])   . Hyperlipemia   . Heart disease    History reviewed. No pertinent past surgical history. History reviewed. No pertinent family history. Social History  Substance Use Topics  . Smoking status: Never Smoker   . Smokeless tobacco: None  . Alcohol Use: 0.6 oz/week    0 Standard drinks or equivalent, 1 Shots of liquor per week     Comment: cocktails every day    Review of Systems  All other systems reviewed and are negative.     Allergies  Cheese and  Pravastatin  Home Medications   Prior to Admission medications   Medication Sig Start Date End Date Taking? Authorizing Provider  aspirin 81 MG chewable tablet Chew 1 tablet (81 mg total) by mouth daily. 08/17/15   [name] Angiulli, PA-C  atorvastatin (LIPITOR) 40 MG tablet Take 1 tablet (40 mg total) by mouth daily at 6 PM. 08/17/15   [name] Angiulli, PA-C  clopidogrel (PLAVIX) 75 MG tablet Take 1 tablet (75 mg total) by mouth daily. 08/17/15   [name] Angiulli, PA-C  diltiazem (CARDIZEM CD) 360 MG 24 hr capsule Take 1 capsule (360 mg total) by mouth daily. 08/17/15   [name] Angiulli, PA-C  insulin detemir (LEVEMIR) 100 UNIT/ML injection Inject 0.22 mLs (22 Units total) into the skin 2 (two) times daily. 08/17/15   [name] Angiulli, PA-C  terbinafine (LAMISIL) 250 MG tablet Take 250 mg by mouth daily. Reported on 08/21/2015 07/25/15   Historical Provider, MD   BP 164/81 mmHg  Pulse 86  Temp(Src) 98.3 F (36.8 C) (Oral)  Resp 13  SpO2 99% Physical Exam  Constitutional: He is oriented to person, place, and time. He appears well-developed and well-nourished.  HENT:  Head: Normocephalic and atraumatic.  Eyes: EOM are normal.  Neck: Normal range of motion.  Cardiovascular: Normal rate, regular rhythm and intact distal pulses.   Murmur heard. Pulmonary/Chest: Effort normal and breath sounds normal. No respiratory distress.  Abdominal: Soft. He  exhibits no distension. There is no tenderness.  Musculoskeletal: Normal range of motion.  Neurological: He is alert and oriented to person, place, and time.  Skin: Skin is warm and dry.  Psychiatric: He has a normal mood and affect. Judgment normal.  Nursing note and vitals reviewed.   ED Course  Procedures (including critical care time) Labs Review Labs Reviewed  BASIC METABOLIC PANEL - Abnormal; Notable for the following:    Sodium 133 (*)    CO2 21 (*)    Glucose, Bld 136 (*)    All other components within normal limits  CBC - Abnormal;  Notable for the following:    WBC 3.1 (*)    All other components within normal limits  TROPONIN I  I-STAT TROPOININ, ED    Imaging Review Dg Chest 2 View  11/16/2015  CLINICAL DATA:  Acute onset of generalized chest pain. Initial encounter. EXAM: CHEST  2 VIEW COMPARISON:  Chest radiograph performed 06/28/2005 FINDINGS: The lungs are well-aerated and clear. There is no evidence of focal opacification, pleural effusion or pneumothorax. The heart is normal in size; the mediastinal contour is within normal limits. No acute osseous abnormalities are seen. IMPRESSION: No acute cardiopulmonary process seen. Electronically Signed   By: Garald Balding M.D.   On: 11/16/2015 05:10   I have personally reviewed and evaluated these images and lab results as part of my medical decision-making.   EKG Interpretation   Date/Time:  Friday November 16 2015 04:20:42 EDT Ventricular Rate:  98 PR Interval:    QRS Duration: 88 QT Interval:  388 QTC Calculation: 496 R Axis:   44 Text Interpretation:  Sinus rhythm Probable left atrial enlargement  Anterior infarct, old nonspecific st changes since prior ecg Confirmed by  Patsie Mccardle  MD, Real Cona ([id number]) on 11/16/2015 4:54:23 AM      MDM   Final diagnoses:  Chest pain, unspecified chest pain type    No active chest pain this time. Nonischemic EKG. Initial troponin is negative. Patient is to be admitted by the hospitalist service. Patient will likely need echocardiogram for the new murmur which is auscultated. He'll need cycled cardiac enzymes. Aspirin prior to arrival. No active pain now.    Jola Schmidt, MD 11/16/15 [phone]

## 2015-11-16 NOTE — Final Progress Note (Signed)
Discharged to home with family office visits in place teaching done  

## 2015-11-16 NOTE — Interval H&P Note (Signed)
Cath Lab Visit (complete for each Cath Lab visit)  Clinical Evaluation Leading to the Procedure:   ACS: Yes.    Non-ACS:    Anginal Classification: CCS III  Anti-ischemic medical therapy: Maximal Therapy (2 or more classes of medications)  Non-Invasive Test Results: No non-invasive testing performed  Prior CABG: No previous CABG      History and Physical Interval Note:  11/16/2015 12:50 PM  Jose Beck  has presented today for surgery, with the diagnosis of Chest pain  The various methods of treatment have been discussed with the patient and family. After consideration of risks, benefits and other options for treatment, the patient has consented to  Procedure(s): Left Heart Cath and Coronary Angiography (N/A) as a surgical intervention .  The patient's history has been reviewed, patient examined, no change in status, stable for surgery.  I have reviewed the patient's chart and labs.  Questions were answered to the patient's satisfaction.     [name] III

## 2015-11-16 NOTE — Care Management Obs Status (Signed)
[facility] NOTIFICATION   Patient Details  Name: Jose Beck MRN: [id number] Date of Birth: June 23, 1945   Medicare Observation Status Notification Given:  Yes    Lacretia Leigh, RN 11/16/2015, 9:52 AM

## 2015-11-16 NOTE — H&P (View-Only) (Signed)
Cardiology Consult    Patient ID: Jose Beck MRN: [id number], DOB/AGE: 70-Oct-1947   Admit date: 11/16/2015 Date of Consult: 11/16/2015  Primary Physician: Geoffery Lyons, MD Reason for Consult: Chest Pain Primary Cardiologist: Previously Dr. Martinique (1999) Requesting Provider: Dr. Posey Pronto   History of Present Illness    Jose Beck is a 70 y.o. male with past medical history of CVA (07/2015), HTN, HLD, and Type 2 DM who presented to [name] ED on 11/16/2015 for evaluation of chest pain.   Reports he woke up to go to the restroom at 0330 when he developed a centralized chest pressure. No associated radiating pain, nausea, vomiting, diaphoresis or dyspnea. EMS was called and his pain resolved several minutes after taking an ASA and SL NTG. Overall, the pain lasted less than 30 minutes. He denies any previous episodes of chest discomfort or dyspnea with exertion over the past several weeks or months.   He reports having known coronary occlusions by cath in 1999 and providers an anatomical drawing of his heart which shows 100% stenosis of the RCA and 100% stenosis of the LAD with potential collaterals drawn. He has not had any repeat catheterizations since. No history of CABG.   In reviewing records, he had a TEE performed in the setting of a CVA which showed an EF of 40% and global hypokinesis. Does not know what his EF was prior to this.  Since being admitted, he denies any repeat episodes of chest discomfort. Initial two troponin values have been negative. LDL at 94. CBC with WBC of 3.1, Hgb 14.4, and platelets 84 (appears to be baseline for him, as this is similar to values obtained 3 months ago). Na+ 133, K+ 3.7, and creatinine 0.76. EKG shows NSR, HR 98, with TWI in inferior leads (no previous tracings available for comparison). CXR with no acute cardiopulmonary abnormalities.  Past Medical History   Past Medical History  Diagnosis Date  . Stroke [facility])     a.  07/2015  . Hypertension   . Diabetes mellitus without complication ([place])   . Hyperlipemia   . Heart disease     History reviewed. No pertinent past surgical history.   Allergies  Allergies  Allergen Reactions  . Cheese Nausea And Vomiting  . Pravastatin     Anxiety attack    Inpatient Medications    . aspirin  81 mg Oral Daily  . atorvastatin  40 mg Oral q1800  . clopidogrel  75 mg Oral Daily  . diltiazem  360 mg Oral Daily  . folic acid  1 mg Oral Daily  . insulin aspart  0-9 Units Subcutaneous Q6H  . insulin detemir  10 Units Subcutaneous Daily  . metoprolol tartrate  25 mg Oral BID  . multivitamin with minerals  1 tablet Oral Daily  . thiamine  100 mg Oral Daily   Or  . thiamine  100 mg Intravenous Daily    Family History    Family History  Problem Relation Age of Onset  . Hypertension Mother     Social History    Social History   Social History  . Marital Status: Married    Spouse Name: N/A  . Number of Children: N/A  . Years of Education: N/A   Occupational History  . Not on file.   Social History Main Topics  . Smoking status: Never Smoker   . Smokeless tobacco: Not on file  . Alcohol Use: 0.6 oz/week    0  Standard drinks or equivalent, 1 Shots of liquor per week     Comment: cocktails every day  . Drug Use: Not on file  . Sexual Activity: Not on file   Other Topics Concern  . Not on file   Social History Narrative     Review of Systems    General:  No chills, fever, night sweats or weight changes.  Cardiovascular:  No dyspnea on exertion, edema, orthopnea, palpitations, paroxysmal nocturnal dyspnea. Positive for chest pain.  Dermatological: No rash, lesions/masses Respiratory: No cough, dyspnea Urologic: No hematuria, dysuria Abdominal:   No nausea, vomiting, diarrhea, bright red blood per rectum, melena, or hematemesis Neurologic:  No visual changes, wkns, changes in mental status. Positive for mild speech impairment (present since  CVA in 07/2015 according to the patient).  All other systems reviewed and are otherwise negative except as noted above.  Physical Exam    Blood pressure 178/88, pulse 95, temperature 98 F (36.7 C), temperature source Oral, resp. rate 16, height 5\' 10"  (1.778 m), weight 251 lb 9.6 oz (114.125 kg), SpO2 98 %.  General: Pleasant, Caucasian male appearing in NAD. Psych: Normal affect. Neuro: Alert and oriented X 3. Moves all extremities spontaneously. Mild speech impairment noted. HEENT: Normal  Neck: Supple without bruits or JVD. Lungs:  Resp regular and unlabored, CTA without wheezing or rales. Heart: RRR no s3, s4, 2/6 SEM at RUSB. Abdomen: Soft, non-tender, non-distended, BS + x 4.  Extremities: No clubbing, cyanosis or edema. DP/PT/Radials 2+ and equal bilaterally.  Labs    Troponin [facility] of Care Test)  Recent Labs  11/16/15 0445  TROPIPOC 0.01    Recent Labs  11/16/15 0445 11/16/15 0702  TROPONINI <0.03 <0.03   Lab Results  Component Value Date   WBC 3.1* 11/16/2015   HGB 14.4 11/16/2015   HCT 41.1 11/16/2015   MCV 93.4 11/16/2015   PLT 84* 11/16/2015     Recent Labs Lab 11/16/15 0445  NA 133*  K 3.7  CL 101  CO2 21*  BUN 13  CREATININE 0.76  CALCIUM 8.9  GLUCOSE 136*   Lab Results  Component Value Date   CHOL 151 11/16/2015   HDL 46 11/16/2015   LDLCALC 94 11/16/2015   TRIG 57 11/16/2015   No results found for: [facility]   Radiology Studies    Dg Chest 2 View  11/16/2015  CLINICAL DATA:  Acute onset of generalized chest pain. Initial encounter. EXAM: CHEST  2 VIEW COMPARISON:  Chest radiograph performed 06/28/2005 FINDINGS: The lungs are well-aerated and clear. There is no evidence of focal opacification, pleural effusion or pneumothorax. The heart is normal in size; the mediastinal contour is within normal limits. No acute osseous abnormalities are seen. IMPRESSION: No acute cardiopulmonary process seen. Electronically Signed   By: Garald Balding  M.D.   On: 11/16/2015 05:10    EKG & Cardiac Imaging    EKG: NSR, HR 98, with TWI in inferior leads (no previous tracings available for comparison).  Echocardiogram: Pending  Assessment & Plan    1. Chest Pain/ History of CAD - reports a history of known coronary occlusions by cath in 1999 and providers an anatomical drawing of his heart which shows 100% stenosis of the RCA and 100% stenosis of the LAD with potential collaterals drawn according to his cath report from 1999. No history of CABG.  - presents with an episode of substernal chest discomfort starting at 0330 this AM with no associated symptoms. Relieved with SL  NTG and ASA. Denies any discomfort currently.  - Initial two troponin values have been negative. EKG shows NSR, HR 98, with TWI in inferior leads (no previous tracings available for comparison).  - with known CAD, presenting symptoms, and reduced EF, would recommend proceeding with a cardiac catheterization. The risks and benefits of the procedure have been discussed and he agrees to proceed. Has been added to the cath board for this afternoon.  - continue ASA, Plavix, and BB (switching to Coreg as below).   2. Chronic Systolic CHF - EF [id number] by records according to TEE in 07/2015. Repeat echo is pending.  - with reduced EF, would recommend stopping Cardizem. Would optimize BB dosing and switch Lopressor to Coreg. Would recommend initiation of an ARB. Will help with BP control as well.   3. CVA - occurred in 07/2015 with residual speech impairment.  - started on Plavix and statin therapy at that time.   4. HLD - continue statin therapy.  5. Thrombocytopenia -  Platelets at 84 on admission. Appears to be baseline for him, as this is similar to values obtained 3 months ago.   Signed, Erma Heritage, PA-C 11/16/2015, 9:34 AM Pager: [phone] Patient seen and examined and history reviewed. Agree with above findings and plan. Patient with history of CAD, DM on  insulin, CVA in March of 2017, HTN and HLD presents with chest pain at rest. He has ruled out for MI. Ecg shows evidence of old septal infarct.  On exam lungs are clear No gallop. Gr 2/6 AS murmur Abd obese soft Good pulses.   Echo reviewed. Moderate LV dysfunction with inferior and anteroseptal and apical HK. Mild AS.   Patient has known CAD by cardiac cath in 1999. Apparently had occlusion of LAD and RCA at that time. Treated medically. No old films available to review. He is at high risk with LV dysfunction and DM. Recommend proceeding with cardiac cath today.  The procedure and risks were reviewed including but not limited to death, myocardial infarction, stroke, arrythmias, bleeding, transfusion, emergency surgery, dye allergy, or renal dysfunction. The patient voices understanding and is agreeable to proceed.  Will also start on ARB and Coreg for LV dysfunction. Would recommend stopping Cardizem with LV dysfunction but he is fairly adamant about taking this.   On Plavix since CVA in March. If he has PCI would need DAPT. I explained that if he has multivessel disease with LV dysfunction in a diabetic that bypass may be best option. Results of cardiac cath will help decide.   Peter Martinique, [place] 11/16/2015 10:05 AM

## 2015-11-16 NOTE — ED Notes (Signed)
Attempted to call report

## 2015-11-16 NOTE — ED Notes (Signed)
Pt arrived to ED via EMS. C/o chest pain with activity. Pt administered 1 Nitro and 324mg  of Aspirin. Pain relieved when he sat down. Pt has cardiac history and history of TIA and stroke. Pain is present in mid-sternal area when present with no radiation. EKG show sinus tach with multifocal PVC. Hx of 2 blockage since 1995,No stents. Some slurred speech from stroke with right sided weakness.

## 2015-11-16 NOTE — Research (Signed)
[facility] FREE Research Study Informed Consent   Subject Name: Jose Beck  Subject met inclusion and exclusion criteria.  The informed consent form, study requirements and expectations were reviewed with the subject and questions and concerns were addressed prior to the signing of the consent form.  The subject verbalized understanding of the trial requirements.  The subject agreed to participate in the trial and signed the informed consent.  The informed consent was obtained prior to performance of any protocol-specific procedures for the subject.  A copy of the signed informed consent was given to the subject and a copy was placed in the subject's medical record.  Blossom Hoops 11/16/2015, 12:41 PM

## 2015-11-16 NOTE — Consult Note (Signed)
Cardiology Consult    Patient ID: Jose Beck MRN: [id number], DOB/AGE: 05/25/1945   Admit date: 11/16/2015 Date of Consult: 11/16/2015  Primary Physician: Geoffery Lyons, MD Reason for Consult: Chest Pain Primary Cardiologist: Previously Dr. Martinique (1999) Requesting Provider: Dr. Posey Pronto   History of Present Illness    Jose Beck is a 70 y.o. male with past medical history of CVA (07/2015), HTN, HLD, and Type 2 DM who presented to [name] ED on 11/16/2015 for evaluation of chest pain.   Reports he woke up to go to the restroom at 0330 when he developed a centralized chest pressure. No associated radiating pain, nausea, vomiting, diaphoresis or dyspnea. EMS was called and his pain resolved several minutes after taking an ASA and SL NTG. Overall, the pain lasted less than 30 minutes. He denies any previous episodes of chest discomfort or dyspnea with exertion over the past several weeks or months.   He reports having known coronary occlusions by cath in 1999 and providers an anatomical drawing of his heart which shows 100% stenosis of the RCA and 100% stenosis of the LAD with potential collaterals drawn. He has not had any repeat catheterizations since. No history of CABG.   In reviewing records, he had a TEE performed in the setting of a CVA which showed an EF of 40% and global hypokinesis. Does not know what his EF was prior to this.  Since being admitted, he denies any repeat episodes of chest discomfort. Initial two troponin values have been negative. LDL at 94. CBC with WBC of 3.1, Hgb 14.4, and platelets 84 (appears to be baseline for him, as this is similar to values obtained 3 months ago). Na+ 133, K+ 3.7, and creatinine 0.76. EKG shows NSR, HR 98, with TWI in inferior leads (no previous tracings available for comparison). CXR with no acute cardiopulmonary abnormalities.  Past Medical History   Past Medical History  Diagnosis Date  . Stroke [facility])     a.  07/2015  . Hypertension   . Diabetes mellitus without complication ([place])   . Hyperlipemia   . Heart disease     History reviewed. No pertinent past surgical history.   Allergies  Allergies  Allergen Reactions  . Cheese Nausea And Vomiting  . Pravastatin     Anxiety attack    Inpatient Medications    . aspirin  81 mg Oral Daily  . atorvastatin  40 mg Oral q1800  . clopidogrel  75 mg Oral Daily  . diltiazem  360 mg Oral Daily  . folic acid  1 mg Oral Daily  . insulin aspart  0-9 Units Subcutaneous Q6H  . insulin detemir  10 Units Subcutaneous Daily  . metoprolol tartrate  25 mg Oral BID  . multivitamin with minerals  1 tablet Oral Daily  . thiamine  100 mg Oral Daily   Or  . thiamine  100 mg Intravenous Daily    Family History    Family History  Problem Relation Age of Onset  . Hypertension Mother     Social History    Social History   Social History  . Marital Status: Married    Spouse Name: N/A  . Number of Children: N/A  . Years of Education: N/A   Occupational History  . Not on file.   Social History Main Topics  . Smoking status: Never Smoker   . Smokeless tobacco: Not on file  . Alcohol Use: 0.6 oz/week    0  Standard drinks or equivalent, 1 Shots of liquor per week     Comment: cocktails every day  . Drug Use: Not on file  . Sexual Activity: Not on file   Other Topics Concern  . Not on file   Social History Narrative     Review of Systems    General:  No chills, fever, night sweats or weight changes.  Cardiovascular:  No dyspnea on exertion, edema, orthopnea, palpitations, paroxysmal nocturnal dyspnea. Positive for chest pain.  Dermatological: No rash, lesions/masses Respiratory: No cough, dyspnea Urologic: No hematuria, dysuria Abdominal:   No nausea, vomiting, diarrhea, bright red blood per rectum, melena, or hematemesis Neurologic:  No visual changes, wkns, changes in mental status. Positive for mild speech impairment (present since  CVA in 07/2015 according to the patient).  All other systems reviewed and are otherwise negative except as noted above.  Physical Exam    Blood pressure 178/88, pulse 95, temperature 98 F (36.7 C), temperature source Oral, resp. rate 16, height 5\' 10"  (1.778 m), weight 251 lb 9.6 oz (114.125 kg), SpO2 98 %.  General: Pleasant, Caucasian male appearing in NAD. Psych: Normal affect. Neuro: Alert and oriented X 3. Moves all extremities spontaneously. Mild speech impairment noted. HEENT: Normal  Neck: Supple without bruits or JVD. Lungs:  Resp regular and unlabored, CTA without wheezing or rales. Heart: RRR no s3, s4, 2/6 SEM at RUSB. Abdomen: Soft, non-tender, non-distended, BS + x 4.  Extremities: No clubbing, cyanosis or edema. DP/PT/Radials 2+ and equal bilaterally.  Labs    Troponin [facility] of Care Test)  Recent Labs  11/16/15 0445  TROPIPOC 0.01    Recent Labs  11/16/15 0445 11/16/15 0702  TROPONINI <0.03 <0.03   Lab Results  Component Value Date   WBC 3.1* 11/16/2015   HGB 14.4 11/16/2015   HCT 41.1 11/16/2015   MCV 93.4 11/16/2015   PLT 84* 11/16/2015     Recent Labs Lab 11/16/15 0445  NA 133*  K 3.7  CL 101  CO2 21*  BUN 13  CREATININE 0.76  CALCIUM 8.9  GLUCOSE 136*   Lab Results  Component Value Date   CHOL 151 11/16/2015   HDL 46 11/16/2015   LDLCALC 94 11/16/2015   TRIG 57 11/16/2015   No results found for: [facility]   Radiology Studies    Dg Chest 2 View  11/16/2015  CLINICAL DATA:  Acute onset of generalized chest pain. Initial encounter. EXAM: CHEST  2 VIEW COMPARISON:  Chest radiograph performed 06/28/2005 FINDINGS: The lungs are well-aerated and clear. There is no evidence of focal opacification, pleural effusion or pneumothorax. The heart is normal in size; the mediastinal contour is within normal limits. No acute osseous abnormalities are seen. IMPRESSION: No acute cardiopulmonary process seen. Electronically Signed   By: Garald Balding  M.D.   On: 11/16/2015 05:10    EKG & Cardiac Imaging    EKG: NSR, HR 98, with TWI in inferior leads (no previous tracings available for comparison).  Echocardiogram: Pending  Assessment & Plan    1. Chest Pain/ History of CAD - reports a history of known coronary occlusions by cath in 1999 and providers an anatomical drawing of his heart which shows 100% stenosis of the RCA and 100% stenosis of the LAD with potential collaterals drawn according to his cath report from 1999. No history of CABG.  - presents with an episode of substernal chest discomfort starting at 0330 this AM with no associated symptoms. Relieved with SL  NTG and ASA. Denies any discomfort currently.  - Initial two troponin values have been negative. EKG shows NSR, HR 98, with TWI in inferior leads (no previous tracings available for comparison).  - with known CAD, presenting symptoms, and reduced EF, would recommend proceeding with a cardiac catheterization. The risks and benefits of the procedure have been discussed and he agrees to proceed. Has been added to the cath board for this afternoon.  - continue ASA, Plavix, and BB (switching to Coreg as below).   2. Chronic Systolic CHF - EF [id number] by records according to TEE in 07/2015. Repeat echo is pending.  - with reduced EF, would recommend stopping Cardizem. Would optimize BB dosing and switch Lopressor to Coreg. Would recommend initiation of an ARB. Will help with BP control as well.   3. CVA - occurred in 07/2015 with residual speech impairment.  - started on Plavix and statin therapy at that time.   4. HLD - continue statin therapy.  5. Thrombocytopenia -  Platelets at 84 on admission. Appears to be baseline for him, as this is similar to values obtained 3 months ago.   Signed, Erma Heritage, PA-C 11/16/2015, 9:34 AM Pager: [phone] Patient seen and examined and history reviewed. Agree with above findings and plan. Patient with history of CAD, DM on  insulin, CVA in March of 2017, HTN and HLD presents with chest pain at rest. He has ruled out for MI. Ecg shows evidence of old septal infarct.  On exam lungs are clear No gallop. Gr 2/6 AS murmur Abd obese soft Good pulses.   Echo reviewed. Moderate LV dysfunction with inferior and anteroseptal and apical HK. Mild AS.   Patient has known CAD by cardiac cath in 1999. Apparently had occlusion of LAD and RCA at that time. Treated medically. No old films available to review. He is at high risk with LV dysfunction and DM. Recommend proceeding with cardiac cath today.  The procedure and risks were reviewed including but not limited to death, myocardial infarction, stroke, arrythmias, bleeding, transfusion, emergency surgery, dye allergy, or renal dysfunction. The patient voices understanding and is agreeable to proceed.  Will also start on ARB and Coreg for LV dysfunction. Would recommend stopping Cardizem with LV dysfunction but he is fairly adamant about taking this.   On Plavix since CVA in March. If he has PCI would need DAPT. I explained that if he has multivessel disease with LV dysfunction in a diabetic that bypass may be best option. Results of cardiac cath will help decide.   Jose Beck, [place] 11/16/2015 10:05 AM

## 2015-11-16 NOTE — Progress Notes (Signed)
Tr ban dc'ed dressing applied

## 2015-11-17 LAB — HEMOGLOBIN A1C
HEMOGLOBIN A1C: 6.2 % — AB (ref 4.8–5.6)
MEAN PLASMA GLUCOSE: 131 mg/dL

## 2015-11-18 NOTE — Discharge Summary (Signed)
[facility] Discharge Summary   Patient: Jose Beck [id number]   PCP: Geoffery Lyons, MD DOB: 10/03/1945   Date of admission: 11/16/2015   Date of discharge: 11/16/2015    Discharge Diagnoses:  Principal Problem:   Chest pain Active Problems:   History of CVA (cerebrovascular accident)   Type 2 diabetes mellitus with circulatory disorder, with long-term current use of insulin (HCC)   Essential hypertension   Thrombocytopenia (HCC)   Leukopenia   Hyponatremia   CAD in native artery   Chronic combined systolic and diastolic CHF (congestive heart failure) ([place])  Admitted From: Home Disposition:  Home with family  Recommendations for Outpatient Follow-up:  1. Follow-up with PCP in one week. 2. Follow-up with cardiology   Follow-up Information    Follow up with ARONSON,RICHARD A, MD. Schedule an appointment as soon as possible for a visit in 1 week.   Specialty:  Internal Medicine   Contact information:   [address] [place] [address] [phone]       Follow up with Murray Hodgkins, NP On 12/13/2015.   Specialties:  Nurse Practitioner, Cardiology, Radiology   Why:  Cardiology Hospital Follow-Up on 12/13/2015 at 1:30PM. (Dr. Doug Sou Office).   Contact information:   [address] [place] [address] [phone]      Diet recommendation: Cardiac diet  Activity: The patient is advised to gradually reintroduce usual activities.  Discharge Condition: good  Code Status: Full code  History of present illness: As per the H and P dictated on admission, "Jose Beck is a 70 y.o. male with a past medical history significant for CAD diffuse, no previous CABG, recent CVA, HTN, and IDDM who presents with chest pain.  The pain woke this morning at 3:30 AM to go to the bathroom, and developed sudden onset chest pain. This was moderate in intensity, dull, central, and constant. His wife called 9-1-1 who recommended he administered  aspirin 325 which she did. When EMS arrived they gave him nitroglycerin which eased the pain. "  Hospital Course:  Summary of his active problems in the hospital is as following.  Principal Problem:   Chest pain   CAD in native artery   Chronic combined systolic and diastolic CHF (congestive heart failure) ([place]) Given patient's prior history of coronary artery disease the patient underwent cardiac catheterization. Cardiac catheterization showed multivessel disease including 100% mid RCA, 100% mid LAD, 100% distal RCA and 60% distal circumflex lesion. Patient had extensive collateral development H was suggesting chronic occlusion and therefore plan was for aggressive risk factor modification as well as aggressive medical therapy. Patient will continue on Plavix, will be discharged with Coreg as well as losartan. Cardizem will be discontinued given the patient has developed systolic dysfunction. Patient will follow up with cardiology clinic in 2-3 weeks. Due to patient's thrombocytopenia dual antiplatelets were not chosen.  Active Problems:   History of CVA (cerebrovascular accident) Continue Plavix.    Type 2 diabetes mellitus with circulatory disorder, with long-term current use of insulin ([place]) Continue home medication.    Essential hypertension Discontinue Cardizem and start the patient on Coreg and losartan.    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 Continuing only Plavix on discharge due to the same.  All other chronic medical condition were stable during the hospitalization.  Patient was ambulatory without any assistance. On the day of the discharge the patient's chest pain resolved and cardiac catheterization was unremarkable, and no other acute medical condition were reported  by patient. the patient was felt safe to be discharge at home with family.  Procedures and Results:  Echocardiogram Study Conclusions  - Left ventricle: The cavity size was  normal. Systolic function was  normal. The estimated ejection fraction was in the range of 55%  to 60%. Wall motion was normal; there were no regional wall  motion abnormalities. There was an increased relative  contribution of atrial contraction to ventricular filling.  Doppler parameters are consistent with abnormal left ventricular  relaxation (grade 1 diastolic dysfunction). - Aortic valve: Moderately calcified annulus. Trileaflet. Moderate  diffuse thickening and calcification. There was mild stenosis.  Valve area (VTI): 2.17 cm^2. Valve area (Vmax): 1.88 cm^2. Valve  area (Vmean): 1.75 cm^2.   Left heart cardiac catheterization  1. Mid RCA lesion, 100% stenosed. 2. Ost 1st Diag to 1st Diag lesion, 70% stenosed. 3. Mid LAD lesion, 100% stenosed. 4. Dist RCA lesion, 100% stenosed. 5. Dist Cx lesion, 60% stenosed.   Total occlusion of the proximal RCA. RCA is heavily calcified. RCA fills by collaterals from the circumflex coronary of the left system. The right coronary is large in distribution.  Total occlusion of the proximal to mid LAD after the origin of the first of perforator and first diagonal. Left to left collateral supply the LAD and a second diagonal. The first diagonal contains eccentric 50-70% narrowing.  Widely patent circumflex including a small ramus intermedius/first obtuse marginal, and a large branching second obtuse marginal. The distal circumflex beyond the second marginal is small and contains 50-70% narrowing. The circumflex is the source of collaterals to the distal right coronary.  Mildly depressed LV function with estimated EF in the 35 to 40% range. LVEDP is mildly elevated. The inferior wall is hypokinetic.   RECOMMENDATIONS:   Images were reviewed with Dr. Martinique. The plan at this time is to continue medical therapy unless symptoms progress.  Aggressive risk factor modification.  Heart failure therapy as  indicated.  Consultations:  Cardiology  DISCHARGE MEDICATION: Discharge Medication List as of 11/16/2015  6:26 PM    START taking these medications   Details  losartan (COZAAR) 25 MG tablet Take 1 tablet (25 mg total) by mouth daily., Starting 11/17/2015, Until Discontinued, Normal    nitroGLYCERIN (NITROSTAT) 0.4 MG SL tablet Place 1 tablet (0.4 mg total) under the tongue every 5 (five) minutes as needed for chest pain., Starting 11/16/2015, Until Discontinued, Normal    thiamine 100 MG tablet Take 1 tablet (100 mg total) by mouth daily., Starting 11/16/2015, Until Discontinued, Normal      CONTINUE these medications which have CHANGED   Details  carvedilol (COREG) 6.25 MG tablet Take 1 tablet (6.25 mg total) by mouth 2 (two) times daily with a meal., Starting 11/16/2015, Until Discontinued, Normal      CONTINUE these medications which have NOT CHANGED   Details  atorvastatin (LIPITOR) 40 MG tablet Take 1 tablet (40 mg total) by mouth daily at 6 PM., Starting 08/17/2015, Until Discontinued, Print    clopidogrel (PLAVIX) 75 MG tablet Take 1 tablet (75 mg total) by mouth daily., Starting 08/17/2015, Until Discontinued, Print    insulin detemir (LEVEMIR) 100 UNIT/ML injection Inject 0.22 mLs (22 Units total) into the skin 2 (two) times daily., Starting 08/17/2015, Until Discontinued, Print      STOP taking these medications     diltiazem (CARDIZEM CD) 360 MG 24 hr capsule      aspirin 81 MG chewable tablet      aspirin 81  MG chewable tablet        Allergies  Allergen Reactions  . Cheese Nausea And Vomiting  . Pravastatin     Anxiety attack   Discharge Instructions    Diet - low sodium heart healthy    Complete by:  As directed      Discharge instructions    Complete by:  As directed   It is important that you read following instructions as well as go over your medication list with RN to help you understand your care after this hospitalization.  Discharge Instructions: Please  follow-up with PCP in one week  Please request your primary care physician to go over all Hospital Tests and Procedure/Radiological results at the follow up,  Please get all Hospital records sent to your PCP by signing hospital release before you go home.   Do not take more than prescribed Pain, Sleep and Anxiety Medications. You were cared for by a hospitalist during your hospital stay. If you have any questions about your discharge medications or the care you received while you were in the hospital after you are discharged, you can call the unit and ask to speak with the hospitalist on call if the hospitalist that took care of you is not available.  Once you are discharged, your primary care physician will handle any further medical issues. Please note that NO REFILLS for any discharge medications will be authorized once you are discharged, as it is imperative that you return to your primary care physician (or establish a relationship with a primary care physician if you do not have one) for your aftercare needs so that they can reassess your need for medications and monitor your lab values. You Must read complete instructions/literature along with all the possible adverse reactions/side effects for all the Medicines you take and that have been prescribed to you. Take any new Medicines after you have completely understood and accept all the possible adverse reactions/side effects. Wear Seat belts while driving. If you have smoked or chewed Tobacco in the last 2 yrs please stop smoking and/or stop any Recreational drug use.     Increase activity slowly    Complete by:  As directed           Discharge Exam: Filed Weights   11/16/15 0642  Weight: 114.125 kg (251 lb 9.6 oz)   Filed Vitals:   11/16/15 1700 11/16/15 1730  BP: 134/80 138/66  Pulse: 84 79  Temp:    Resp: 20 20   General: Appear in no distress, no Rash; Oral Mucosa moist. Cardiovascular: S1 and S2 Present, aortic systolic  Murmur, no JVD Respiratory: Bilateral Air entry present and Clear to Auscultation, no Crackles, no wheezes Abdomen: Bowel Sound present, Soft and no tenderness Extremities: no Pedal edema, no calf tenderness Neurology: Grossly no focal neuro deficit.  The results of significant diagnostics from this hospitalization (including imaging, microbiology, ancillary and laboratory) are listed below for reference.    Significant Diagnostic Studies: Dg Chest 2 View  11/16/2015  CLINICAL DATA:  Acute onset of generalized chest pain. Initial encounter. EXAM: CHEST  2 VIEW COMPARISON:  Chest radiograph performed 06/28/2005 FINDINGS: The lungs are well-aerated and clear. There is no evidence of focal opacification, pleural effusion or pneumothorax. The heart is normal in size; the mediastinal contour is within normal limits. No acute osseous abnormalities are seen. IMPRESSION: No acute cardiopulmonary process seen. Electronically Signed   By: Garald Balding M.D.   On: 11/16/2015 05:10  Microbiology: No results found for this or any previous visit (from the past 240 hour(s)).   Labs: CBC:  Recent Labs Lab 11/16/15 0445 11/16/15 1723  WBC 3.1* 4.4  HGB 14.4 14.8  HCT 41.1 42.1  MCV 93.4 94.0  PLT 84* [id number]*   Basic Metabolic Panel:  Recent Labs Lab 11/16/15 0445 11/16/15 0645 11/16/15 1723  NA 133*  --   --   K 3.7  --   --   CL 101  --   --   CO2 21*  --   --   GLUCOSE 136*  --   --   BUN 13  --   --   CREATININE 0.76  --  0.65  CALCIUM 8.9  --   --   MG  --  1.8  --    Liver Function Tests: No results for input(s): AST, ALT, ALKPHOS, BILITOT, PROT, ALBUMIN in the last 168 hours. No results for input(s): LIPASE, AMYLASE in the last 168 hours. No results for input(s): AMMONIA in the last 168 hours. Cardiac Enzymes:  Recent Labs Lab 11/16/15 0445 11/16/15 0702 11/16/15 1012 11/16/15 1229  TROPONINI <0.03 <0.03 <0.03 <0.03   BNP (last 3 results) No results for input(s): BNP  in the last 8760 hours. CBG:  Recent Labs Lab 11/16/15 1136  GLUCAP 180*   Time spent: 30 minutes  Signed:  Tawney Vanorman  [facility] 11/16/2015 , 11:33 PM

## 2015-11-19 ENCOUNTER — Ambulatory Visit: Payer: Medicare Other | Admitting: Rehabilitation

## 2015-11-19 ENCOUNTER — Ambulatory Visit: Payer: Medicare Other | Attending: Physical Medicine & Rehabilitation | Admitting: *Deleted

## 2015-11-19 ENCOUNTER — Encounter (HOSPITAL_COMMUNITY): Payer: Self-pay | Admitting: Interventional Cardiology

## 2015-11-19 DIAGNOSIS — R471 Dysarthria and anarthria: Secondary | ICD-10-CM | POA: Diagnosis not present

## 2015-11-19 DIAGNOSIS — R41841 Cognitive communication deficit: Secondary | ICD-10-CM | POA: Diagnosis not present

## 2015-11-19 NOTE — Therapy (Signed)
[facility] [address] [place], [name], [address] Phone: [phone]   Fax:  [phone]  Speech Language Pathology Treatment  Patient Details  Name: Jose Beck MRN: [id number] Date of Birth: [birth year] Referring Provider: Dr, Lupe Carney  Encounter Date: 11/19/2015      End of Session - 11/19/15 1625    Visit Number 21   Number of Visits 24   Date for SLP Re-Evaluation 11/30/15   SLP Start Time 1530   SLP Stop Time  1616   SLP Time Calculation (min) 46 min   Activity Tolerance Patient tolerated treatment well      Past Medical History  Diagnosis Date  . Stroke [facility])     a. 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 Hypertension   . Diabetes mellitus without complication ([place])   . Hyperlipemia   . Thrombocytopenia ([place]) 11/2015  . Coronary artery disease     a. patient reports 100% stenosis of LAD and RCA by cath in 1999. b. undergoing repeat cath in 11/2015  . GERD (gastroesophageal reflux disease)   . Chronic combined systolic and diastolic CHF (congestive heart failure) ([place])     a. 07/2015: TEE showing EF of 40% b. 11/2015: Echo w/ EF 55-60%, Grade 1 DD, mild AS    Past Surgical History  Procedure Laterality Date  . Tonsillectomy    . Cardiac catheterization    . Cardiac catheterization N/A 11/16/2015    Procedure: Left Heart Cath and Coronary Angiography;  Surgeon: [name], MD;  Location: [facility];  Service: Cardiovascular;  Laterality: N/A;    There were no vitals filed for this visit.      Subjective Assessment - 11/19/15 1619    Subjective "My speech is terrible."   Patient is accompained by: Family member  spouse, [name]   Currently in Pain? No/denies               ADULT SLP TREATMENT - 11/19/15 1620    General Information   Behavior/Cognition Alert;Cooperative;Pleasant mood   Treatment Provided   Treatment provided Cognitive-Linquistic   Cognitive-Linquistic Treatment   Treatment focused on Dysarthria;Cognition   Skilled Treatment Pt able to recall and demonstrate use of strategies to increase intelligibility at mod I. Pt demonstrated good self-monitoring and self-correction. Pt reports difficulty with recalling names. Discussed compensatory strategies of rehearsal, repetition and association to facilitate recall. Pt's spouse indicates that pt has  increased difficulty with topic maintenance. Developed strategy of spouse raising hand as a signal to request clarification when pt jumps topics. Pt is agreeable to trying this strategy.    Assessment / Recommendations / Plan   Plan Continue with current plan of care   Progression Toward Goals   Progression toward goals Progressing toward goals          SLP Education - 11/19/15 1624    Education provided Yes   Education Details Discussed strategically planning day for energy conservation.   Person(s) Educated Spouse;Patient   Methods Explanation   Comprehension Verbalized understanding          SLP Short Term Goals - 11/19/15 1627    SLP SHORT TERM GOAL #1   Title Pt will perform dysarthria HEP with mod I   Time 1   Period Weeks   Status On-going          SLP Long Term Goals - 11/19/15 1628    SLP LONG TERM GOAL #5  Title pt will produce speech in 15 minutes mod complex conversation with self correction for intelligibility, over three sessions   Baseline 11/19/15   Time 2   Period Weeks   Status On-going          Plan - 11/19/15 1625    Clinical Impression Statement Pt is mostly mod I for speech intelligibility at conversational level. Discussed memory storage strategies and energy conservation.    Speech Therapy Frequency 2x / week   Duration --  3 weeks   Treatment/Interventions Cognitive reorganization;Internal/external aids;Compensatory strategies;SLP instruction and feedback;Patient/family education;Functional tasks;Language facilitation   Potential  to Achieve Goals Good   Consulted and Agree with Plan of Care Patient;Family member/caregiver   Family Member Consulted spouse      Patient will benefit from skilled therapeutic intervention in order to improve the following deficits and impairments:   Dysarthria and anarthria  Cognitive communication deficit    Problem List Patient Active Problem List   Diagnosis Date Noted  . Chest pain 11/16/2015  . Type 2 diabetes mellitus with circulatory disorder, with long-term current use of insulin ([place]) 11/16/2015  . Essential hypertension 11/16/2015  . Thrombocytopenia ([place]) 11/16/2015  . Leukopenia 11/16/2015  . Hyponatremia 11/16/2015  . Chronic combined systolic and diastolic CHF (congestive heart failure) ([place]) 11/16/2015  . CAD in native artery   . Brainstem infarct, acute ([place]) 09/17/2015  . 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 Obesity 09/17/2015  . 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 Gait disturbance, 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  . History of CVA (cerebrovascular accident) 08/09/2015    Vinetta Bergamo MA, [place] 11/19/2015, 4:30 PM  [facility] [address] [place], [name], [address] Phone: [phone]   Fax:  [phone]   Name: Jose Beck MRN: [id number] Date of Birth: [birth year]

## 2015-11-21 ENCOUNTER — Ambulatory Visit: Payer: Medicare Other

## 2015-11-21 DIAGNOSIS — R471 Dysarthria and anarthria: Secondary | ICD-10-CM | POA: Diagnosis not present

## 2015-11-21 DIAGNOSIS — R41841 Cognitive communication deficit: Secondary | ICD-10-CM | POA: Diagnosis not present

## 2015-11-21 NOTE — Therapy (Signed)
[name], [name], [address] Phone: [phone]   Fax:  [phone]  Speech Language Pathology Treatment  Patient Details  Name: Jose Beck MRN: [id number] Date of Birth: 1946/01/08 Referring Provider: Dr, Lupe Carney  Encounter Date: 11/21/2015      End of Session - 11/21/15 1326    Visit Number 22   Number of Visits 24   Date for SLP Re-Evaluation 11/30/15   SLP Start Time 1320   SLP Stop Time  1400   SLP Time Calculation (min) 40 min   Activity Tolerance Patient tolerated treatment well      Past Medical History  Diagnosis Date  . Stroke [facility])     a. 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 Hypertension   . Diabetes mellitus without complication ([place])   . Hyperlipemia   . Thrombocytopenia ([place]) 11/2015  . Coronary artery disease     a. patient reports 100% stenosis of LAD and RCA by cath in 1999. b. undergoing repeat cath in 11/2015  . GERD (gastroesophageal reflux disease)   . Chronic combined systolic and diastolic CHF (congestive heart failure) ([place])     a. 07/2015: TEE showing EF of 40% b. 11/2015: Echo w/ EF 55-60%, Grade 1 DD, mild AS    Past Surgical History  Procedure Laterality Date  . Tonsillectomy    . Cardiac catheterization    . Cardiac catheterization N/A 11/16/2015    Procedure: Left Heart Cath and Coronary Angiography;  Surgeon: [name], MD;  Location: [facility];  Service: Cardiovascular;  Laterality: N/A;    There were no vitals filed for this visit.      Subjective Assessment - 11/21/15 1408    Subjective "I've always (switched topics prematurely)."                ADULT SLP TREATMENT - 11/21/15 1339    General Information   Behavior/Cognition Alert;Cooperative;Pleasant mood   Treatment Provided   Treatment provided Cognitive-Linquistic   Cognitive-Linquistic Treatment   Treatment focused on Dysarthria;Cognition    Skilled Treatment Pt maintained conversation for 30 minutes (mod complex) with pt intelligibility 98%. Pt self corrected x8, and SLP requested x1.    Assessment / Recommendations / Plan   Plan Continue with current plan of care   Progression Toward Goals   Progression toward goals Progressing toward goals            SLP Short Term Goals - 11/19/15 1627    SLP SHORT TERM GOAL #1   Title Pt will perform dysarthria HEP with mod I   Time 1   Period Weeks   Status On-going          SLP Long Term Goals - 11/21/15 1402    SLP LONG TERM GOAL #1   Title Pt will be 95% intellgible during simple conversation of 10 minutes with rare min A, over three sessions   Baseline two sessions 10-01-15   Status Achieved   SLP LONG TERM GOAL #2   Title Pt will participate in mildly complex conversation over 12 minutes with less than 3 requests for clarification/repeair communication breakdown over 3 sessions   Baseline one session 10-15-15, 11/08/15   Status Achieved  not met; renewed for week of 10-29-15   SLP LONG TERM GOAL #3   Title Pt will utilize compensations for short term memory to recall details, lists, messages with 85% accuracy and rare min A.  Status Deferred  due to names of places and events in long term memory   SLP LONG TERM GOAL #4   Title Pt will report utilization of compensations for short term memory in household cognitive-linguistic tasks   Time 3   Period Weeks   Status Deferred  renewed for week of 10-29-15   SLP LONG TERM GOAL #5   Title pt will produce speech in 30 minutes mod complex conversation with self correction for intelligibility, over two sessions   Baseline one session 11-21-15   Time 2   Period Weeks   Status Revised          Plan - 11/21/15 1402    Clinical Impression Statement Pt is mostly mod I for speech intelligibility at conversational level. Discussed memory storage strategies and energy conservation.    Speech Therapy Frequency 2x / week    Duration 2 weeks   Treatment/Interventions Cognitive reorganization;Internal/external aids;Compensatory strategies;SLP instruction and feedback;Patient/family education;Functional tasks;Language facilitation   Potential to Achieve Goals Good   Potential Considerations Ability to learn/carryover information      Patient will benefit from skilled therapeutic intervention in order to improve the following deficits and impairments:   Dysarthria and anarthria    Problem List Patient Active Problem List   Diagnosis Date Noted  . Chest pain 11/16/2015  . Type 2 diabetes mellitus with circulatory disorder, with long-term current use of insulin ([place]) 11/16/2015  . Essential hypertension 11/16/2015  . Thrombocytopenia ([place]) 11/16/2015  . Leukopenia 11/16/2015  . Hyponatremia 11/16/2015  . Chronic combined systolic and diastolic CHF (congestive heart failure) ([place]) 11/16/2015  . CAD in native artery   . Brainstem infarct, acute ([place]) 09/17/2015  . 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 Obesity 09/17/2015  . 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 Gait disturbance, 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  . History of CVA (cerebrovascular accident) 08/09/2015    [facility] ,[place], [place]  11/21/2015, 2:08 PM  [facility] [address] [place], [name], [address] Phone: [phone]   Fax:  [phone]   Name: Jose Beck MRN: [id number] Date of Birth: [birth year]

## 2015-11-22 ENCOUNTER — Ambulatory Visit: Payer: Medicare Other | Admitting: Rehabilitation

## 2015-11-22 DIAGNOSIS — I10 Essential (primary) hypertension: Secondary | ICD-10-CM | POA: Diagnosis not present

## 2015-11-22 DIAGNOSIS — Z96641 Presence of right artificial hip joint: Secondary | ICD-10-CM | POA: Diagnosis not present

## 2015-11-22 DIAGNOSIS — K921 Melena: Secondary | ICD-10-CM | POA: Diagnosis not present

## 2015-11-23 DIAGNOSIS — I10 Essential (primary) hypertension: Secondary | ICD-10-CM | POA: Diagnosis not present

## 2015-11-23 DIAGNOSIS — Z96641 Presence of right artificial hip joint: Secondary | ICD-10-CM | POA: Diagnosis not present

## 2015-11-23 DIAGNOSIS — R001 Bradycardia, unspecified: Secondary | ICD-10-CM | POA: Diagnosis not present

## 2015-11-24 DIAGNOSIS — I499 Cardiac arrhythmia, unspecified: Secondary | ICD-10-CM | POA: Diagnosis not present

## 2015-11-27 ENCOUNTER — Encounter: Payer: Self-pay | Admitting: Physical Medicine & Rehabilitation

## 2015-11-27 ENCOUNTER — Encounter: Payer: Medicare Other | Attending: Physical Medicine & Rehabilitation

## 2015-11-27 ENCOUNTER — Ambulatory Visit (HOSPITAL_BASED_OUTPATIENT_CLINIC_OR_DEPARTMENT_OTHER): Payer: Medicare Other | Admitting: Physical Medicine & Rehabilitation

## 2015-11-27 ENCOUNTER — Telehealth: Payer: Self-pay | Admitting: Cardiology

## 2015-11-27 VITALS — BP 182/111 | HR 83 | Resp 14

## 2015-11-27 DIAGNOSIS — I63219 Cerebral infarction due to unspecified occlusion or stenosis of unspecified vertebral arteries: Secondary | ICD-10-CM | POA: Diagnosis not present

## 2015-11-27 DIAGNOSIS — I69398 Other sequelae of cerebral infarction: Secondary | ICD-10-CM | POA: Diagnosis not present

## 2015-11-27 DIAGNOSIS — R269 Unspecified abnormalities of gait and mobility: Secondary | ICD-10-CM | POA: Diagnosis not present

## 2015-11-27 DIAGNOSIS — F1010 Alcohol abuse, uncomplicated: Secondary | ICD-10-CM | POA: Diagnosis not present

## 2015-11-27 DIAGNOSIS — E119 Type 2 diabetes mellitus without complications: Secondary | ICD-10-CM | POA: Insufficient documentation

## 2015-11-27 DIAGNOSIS — Z8673 Personal history of transient ischemic attack (TIA), and cerebral infarction without residual deficits: Secondary | ICD-10-CM | POA: Diagnosis present

## 2015-11-27 DIAGNOSIS — I69393 Ataxia following cerebral infarction: Secondary | ICD-10-CM | POA: Diagnosis not present

## 2015-11-27 DIAGNOSIS — I10 Essential (primary) hypertension: Secondary | ICD-10-CM | POA: Diagnosis not present

## 2015-11-27 MED ORDER — CARVEDILOL 12.5 MG PO TABS: 12.5000 mg | ORAL_TABLET | Freq: Two times a day (BID) | ORAL | Status: DC

## 2015-11-27 MED ORDER — LOSARTAN POTASSIUM 50 MG PO TABS: 50.0000 mg | ORAL_TABLET | Freq: Every day | ORAL | Status: DC

## 2015-11-27 NOTE — Addendum Note (Signed)
Addended by: Kathyrn Lass on: 11/27/2015 06:19 PM   Modules accepted: Orders, Medications

## 2015-11-27 NOTE — Telephone Encounter (Signed)
I would increase Coreg to 12.5 mg twice a day and increase losartan to 50 mg daily. Continue to monitor BP  Gissella Niblack Martinique MD, [facility]

## 2015-11-27 NOTE — Progress Notes (Signed)
Subjective:    Patient ID: Jose Beck, male    DOB: [birth year], 70 y.o.   MRN: [id number]  HPI Has finished PT/OT Continues with Speech for a couple visits Doing HEP No Falls at home No longer uses walker.  Uses cane outside of home Overall, pleased with progress Pain Inventory Average Pain 0 Pain Right Now 0 My pain is no pain  In the last 24 hours, has pain interfered with the following? General activity 0 Relation with others 0 Enjoyment of life 0 What TIME of day is your pain at its worst? no pain Sleep (in general) Good  Pain is worse with: no pain Pain improves with: no pain Relief from Meds: no pain  Mobility walk with assistance use a cane  Function retired  Neuro/Psych No problems in this area  Prior Studies Any changes since last visit?  no  Physicians involved in your care Any changes since last visit?  no   Family History  Problem Relation Age of Onset  . Hypertension Mother    Social History   Social History  . Marital Status: Married    Spouse Name: N/A  . Number of Children: N/A  . Years of Education: N/A   Social History Main Topics  . Smoking status: Never Smoker   . Smokeless tobacco: Never Used  . Alcohol Use: 0.6 oz/week    1 Shots of liquor, 0 Standard drinks or equivalent per week     Comment: cocktails every day  . Drug Use: No  . Sexual Activity: Not Asked   Other Topics Concern  . None   Social History Narrative   Past Surgical History  Procedure Laterality Date  . Tonsillectomy    . Cardiac catheterization    . Cardiac catheterization N/A 11/16/2015    Procedure: Left Heart Cath and Coronary Angiography;  Surgeon: [name], MD;  Location: [facility];  Service: Cardiovascular;  Laterality: N/A;   Past Medical History  Diagnosis Date  . Stroke [facility])     a. 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 Hypertension   . Diabetes mellitus without complication ([place])   . Hyperlipemia   .  Thrombocytopenia ([place]) 11/2015  . Coronary artery disease     a. patient reports 100% stenosis of LAD and RCA by cath in 1999. b. undergoing repeat cath in 11/2015  . GERD (gastroesophageal reflux disease)   . Chronic combined systolic and diastolic CHF (congestive heart failure) ([place])     a. 07/2015: TEE showing EF of 40% b. 11/2015: Echo w/ EF 55-60%, Grade 1 DD, mild AS   BP 182/111 mmHg  Pulse 83  Resp 14  SpO2 97%  Opioid Risk Score:   Fall Risk Score:  `1  Depression screen PHQ 2/9  Depression screen PHQ 2/9 08/29/2015  Decreased Interest 0  Down, Depressed, Hopeless 0  PHQ - 2 Score 0  Altered sleeping 0  Tired, decreased energy 1  Change in appetite 0  Feeling bad or failure about yourself  0  Trouble concentrating 0  Moving slowly or fidgety/restless 2  Suicidal thoughts 0  PHQ-9 Score 3  Difficult doing work/chores Somewhat difficult     Review of Systems  Constitutional: Negative.   HENT: Negative.   Eyes: Negative.   Respiratory: Negative.   Cardiovascular: Negative.   Gastrointestinal: Negative.   Endocrine: Negative.        High blood sugar  Genitourinary: Negative.   Musculoskeletal: Positive  for gait problem.  Skin: Negative.   Allergic/Immunologic: Negative.   Neurological: Negative.   Hematological: Negative.   Psychiatric/Behavioral: Negative.   All other systems reviewed and are negative.      Objective:   Physical Exam  Constitutional: He is oriented to person, place, and time. He appears well-developed and well-nourished.  HENT:  Head: Normocephalic and atraumatic.  Eyes: Conjunctivae and EOM are normal. Pupils are equal, round, and reactive to light.  Neck: Normal range of motion.  Neurological: He is alert and oriented to person, place, and time.  Ataxia FNF   Skin: Skin is warm and dry.  Psychiatric: He has a normal mood and affect.  Nursing note and vitals reviewed.  Speech with ataxic dysarthria, cadence is irregular        Assessment & Plan:  1. 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 No physical medicine and rehabilitation follow-up needed at the current time  Follow up with Neurology     2. Right hip pain  resolved  3. Hypertension:  Cont Cardizem 360 mg daily  Encouraged pt to follow up with PCP/Dr [name], Cardiology Dr Tamala Julian  4. Diabetes mellitus with peripheral neuropathy.   F/u PCP/Dr [name]  5. History of alcohol abuse. We discussed poststroke, no more than 2 drinks per day are recommended. He states he drinks about 3 a day

## 2015-11-27 NOTE — Telephone Encounter (Signed)
Returned call to patient's wife.Dr.Jordan's recommendations given.Advised to continue to monitor B/P and call back if continues to be elevated.

## 2015-11-27 NOTE — Telephone Encounter (Signed)
Returned call to patient's wife.She stated husband was discharged from hospital recently.He started taking carvedilol 6.25 mg twice a day and losartan 25 mg daily on 11/25/15.Stated she is concerned B/P elevated ranging 159/94,177/95,170/90,182/11,176/102 pulse 70's.Stated husband is feeling good.Advised Dr.Jordan out of office.I will send message to him for advice.

## 2015-11-27 NOTE — Telephone Encounter (Signed)
°  New Prob   Has some questions regarding pts carvedilol (COREG) 6.25 MG tablet and losartan (COZAAR) 25 MG tablet. Please call.

## 2015-11-29 ENCOUNTER — Ambulatory Visit: Payer: Medicare Other

## 2015-11-29 DIAGNOSIS — R41841 Cognitive communication deficit: Secondary | ICD-10-CM | POA: Diagnosis not present

## 2015-11-29 DIAGNOSIS — R471 Dysarthria and anarthria: Secondary | ICD-10-CM

## 2015-11-29 NOTE — Therapy (Signed)
[facility] [address] [place], [name], [address] Phone: [phone]   Fax:  [phone]  Speech Language Pathology Treatment  Patient Details  Name: Jose Beck MRN: [id number] Date of Birth: [birth year] Referring Provider: Dr, Lupe Carney  Encounter Date: 11/29/2015      End of Session - 11/29/15 1654    Visit Number 23   Number of Visits 24   Date for SLP Re-Evaluation 11/30/15   SLP Start Time 0850   SLP Stop Time  0930   SLP Time Calculation (min) 40 min   Activity Tolerance Patient tolerated treatment well      Past Medical History  Diagnosis Date  . Stroke [facility])     a. 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 Hypertension   . Diabetes mellitus without complication ([place])   . Hyperlipemia   . Thrombocytopenia ([place]) 11/2015  . Coronary artery disease     a. patient reports 100% stenosis of LAD and RCA by cath in 1999. b. undergoing repeat cath in 11/2015  . GERD (gastroesophageal reflux disease)   . Chronic combined systolic and diastolic CHF (congestive heart failure) ([place])     a. 07/2015: TEE showing EF of 40% b. 11/2015: Echo w/ EF 55-60%, Grade 1 DD, mild AS    Past Surgical History  Procedure Laterality Date  . Tonsillectomy    . Cardiac catheterization    . Cardiac catheterization N/A 11/16/2015    Procedure: Left Heart Cath and Coronary Angiography;  Surgeon: [name], MD;  Location: [facility];  Service: Cardiovascular;  Laterality: N/A;    There were no vitals filed for this visit.             ADULT SLP TREATMENT - 11/29/15 0923    General Information   Behavior/Cognition Alert;Cooperative;Pleasant mood   Treatment Provided   Treatment provided Cognitive-Linquistic   Cognitive-Linquistic Treatment   Treatment focused on Dysarthria;Cognition   Skilled Treatment SLP ensured pt was consistent with intelligible speech for 30 minutes. Pt self corrected     Assessment / Recommendations / Plan   Plan Continue with current plan of care   Progression Toward Goals   Progression toward goals Progressing toward goals            SLP Short Term Goals - 11/19/15 1627    SLP SHORT TERM GOAL #1   Title Pt will perform dysarthria HEP with mod I   Time 1   Period Weeks   Status On-going          SLP Long Term Goals - 11/29/15 6962    SLP LONG TERM GOAL #1   Title Pt will be 95% intellgible during simple conversation of 10 minutes with rare min A, over three sessions   Baseline    Status Achieved   SLP LONG TERM GOAL #2   Title Pt will participate in mildly complex conversation over 12 minutes with less than 3 requests for clarification/repeair communication breakdown over 3 sessions   Status Achieved     SLP LONG TERM GOAL #3   Title Pt will utilize compensations for short term memory to recall details, lists, messages with 85% accuracy and rare min A.   Status Deferred  due to names of places and events in long term memory   SLP LONG TERM GOAL #4   Title Pt will report utilization of compensations for short term memory in household cognitive-linguistic tasks   Time  3   Period Weeks   Status Deferred     SLP LONG TERM GOAL #5   Title pt will produce speech in 30 minutes mod complex conversation with self correction for intelligibility, over two sessions   Baseline --   Time --   Period --   Status Achieved        Patient will benefit from skilled therapeutic intervention in order to improve the following deficits and impairments:   Dysarthria and anarthria  Cognitive communication deficit      G-Codes - December 20, 2015 1652    Functional Assessment Tool Used NOMS   Functional Limitations Motor speech   Motor Speech Goal Status [phone]) At least 1 percent but less than 20 percent impaired, limited or restricted   Motor Speech Goal Status (S2876) At least 1 percent but less than 20 percent impaired, limited or restricted      [place]  Visits from Start of Care: 23  Current functional level related to goals / functional outcomes: See pt;s goal update above. Overall pt made good gains with speech intelligibility however still exhibits mild ataxic dysarthria. His cognitive linguistic status is not completely at baseline, but pt has difficulty with some higher level cognitive functions. When appropriate, a formal driving evaluation may be helpful to assess pt's readiness to return to driving.   Remaining deficits: Dysarthria, cognitive-linguistics   Education / Equipment: Compensations for dysarthria and memory. Plan: Patient agrees to discharge.  Patient goals were partially met. Patient is being discharged due to being pleased with the current functional level.  ?????       Problem List Patient Active Problem List   Diagnosis Date Noted  . Chest pain 11/16/2015  . Type 2 diabetes mellitus with circulatory disorder, with long-term current use of insulin ([place]) 11/16/2015  . Essential hypertension 11/16/2015  . Thrombocytopenia ([place]) 11/16/2015  . Leukopenia 11/16/2015  . Hyponatremia 11/16/2015  . Chronic combined systolic and diastolic CHF (congestive heart failure) ([place]) 11/16/2015  . CAD in native artery   . Brainstem infarct, acute ([place]) 09/17/2015  . 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 Obesity 09/17/2015  . 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 Gait disturbance, 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  . History of CVA (cerebrovascular accident) 08/09/2015    [facility] ,[place], [place]   12/20/15, 4:58 PM  [facility] [address] [place], [name], [address] Phone: [phone]   Fax:  [phone]   Name: Jose Beck MRN: [id number] Date of Birth: 1945-08-19

## 2015-12-13 ENCOUNTER — Encounter: Payer: Self-pay | Admitting: Nurse Practitioner

## 2015-12-13 ENCOUNTER — Ambulatory Visit (INDEPENDENT_AMBULATORY_CARE_PROVIDER_SITE_OTHER): Payer: Medicare Other | Admitting: Nurse Practitioner

## 2015-12-13 VITALS — BP 138/82 | HR 75 | Ht 70.0 in | Wt 248.2 lb

## 2015-12-13 DIAGNOSIS — I119 Hypertensive heart disease without heart failure: Secondary | ICD-10-CM | POA: Diagnosis not present

## 2015-12-13 DIAGNOSIS — E785 Hyperlipidemia, unspecified: Secondary | ICD-10-CM | POA: Diagnosis not present

## 2015-12-13 DIAGNOSIS — I10 Essential (primary) hypertension: Secondary | ICD-10-CM | POA: Insufficient documentation

## 2015-12-13 DIAGNOSIS — I25118 Atherosclerotic heart disease of native coronary artery with other forms of angina pectoris: Secondary | ICD-10-CM | POA: Diagnosis not present

## 2015-12-13 DIAGNOSIS — I63219 Cerebral infarction due to unspecified occlusion or stenosis of unspecified vertebral arteries: Secondary | ICD-10-CM

## 2015-12-13 NOTE — Patient Instructions (Signed)
Ignacia Bayley, NP, recommends that you schedule a follow-up appointment in 3 months with Dr Martinique.  If you need a refill on your cardiac medications before your next appointment, please call your pharmacy.

## 2015-12-13 NOTE — Progress Notes (Signed)
Office Visit    Patient Name: Jose Beck Date of Encounter: 12/13/2015  Primary Care Provider:  Geoffery Lyons, MD Primary Cardiologist:  P. Martinique, MD   Chief Complaint    70 year old male with a prior history of CAD, hypertension, hyperlipidemia, and stroke who presents for hospital follow-up after recent admission for chest pain.  Past Medical History    Past Medical History:  Diagnosis Date  . Aortic stenosis    a. 11/2015 Echo: EF 55-60%, Gr1 DD, mild AS.  Marland Kitchen Chronic combined systolic and diastolic CHF (congestive heart failure) ([place])    a. 07/2015: TEE showing EF of 40% b. 11/2015: Echo w/ EF 55-60%, Grade 1 DD, mild AS.  Marland Kitchen Coronary artery disease    a. patient reports 100% stenosis of LAD and RCA by cath in 1999. b. 11/2015 Cath: LM nl, LAD 100 - fills by L->L collats from D1, LCX 60d, OM1 lalrge/nl, OM2 small/nl, RCA 189m, L->R collats.EF 35-45% (55-60% by echo).  . Diabetes mellitus without complication ([place])   . GERD (gastroesophageal reflux disease)   . Hyperlipemia   . Hypertensive heart disease   . Stroke [facility])    a. 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 Thrombocytopenia ([place]) 11/2015   Past Surgical History:  Procedure Laterality Date  . CARDIAC CATHETERIZATION    . CARDIAC CATHETERIZATION N/A 11/16/2015   Procedure: Left Heart Cath and Coronary Angiography;  Surgeon: [name], MD;  Location: [facility];  Service: Cardiovascular;  Laterality: N/A;  . TONSILLECTOMY      Allergies  Allergies  Allergen Reactions  . Cheese Nausea And Vomiting  . Pravastatin     Anxiety attack    History of Present Illness    69 year old male with prior history of CAD and known occlusive disease involving the right coronary artery and LAD with collateral circulation. He has been medically managed over the years. 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 He has recovered  reasonably well the setting of inpatient and subsequently outpatient rehabilitation and is no longer using a cane. He continues to have some amount of aphasia.  He was admitted to [facility] in July with chest pain and ruled out. He subsequently underwent catheterization revealing occluded LAD with left to left collaterals and also an occluded right coronary artery with left-to-right collaterals. EF was depressed on left ventriculogram but was normal on echo. Medical therapy was recommended and his regimen was changed to include carvedilol and losartan. He had previously been on diltiazem, and this was continued.  Since discharge, he has done reasonably well. He has been riding a recumbent bike every other day and occasionally notes mild chest tightness while doing so, which resolves without having to stop his activities. He has otherwise not been having chest pain or dyspnea and further denies PND, orthopnea, dizziness, syncope, edema, or early satiety.  Home Medications    Prior to Admission medications   Medication Sig Start Date End Date Taking? Authorizing Provider  atorvastatin (LIPITOR) 40 MG tablet Take 1 tablet (40 mg total) by mouth daily at 6 PM. 08/17/15  Yes Daniel J Angiulli, PA-C  carvedilol (COREG) 12.5 MG tablet Take 1 tablet (12.5 mg total) by mouth 2 (two) times daily. 11/27/15  Yes [name], MD  clopidogrel (PLAVIX) 75 MG tablet Take 1 tablet (75 mg total) by mouth daily. 08/17/15  Yes Daniel J Angiulli, PA-C  insulin  detemir (LEVEMIR) 100 UNIT/ML injection Inject 0.22 mLs (22 Units total) into the skin 2 (two) times daily. 08/17/15  Yes Daniel J Angiulli, PA-C  losartan (COZAAR) 50 MG tablet Take 1 tablet (50 mg total) by mouth daily. 11/27/15  Yes [name], MD  nitroGLYCERIN (NITROSTAT) 0.4 MG SL tablet Place 0.4 mg under the tongue every 5 (five) minutes as needed for chest pain (x 3 doses).   Yes Historical Provider, MD    Review of Systems    As above, has been doing  reasonably well since his discharge. He has had occasional chest tightness while using a recumbent bike, but this does not limit his activity and resolve spontaneously.  He denies palpitations, dyspnea, pnd, orthopnea, n, v, dizziness, syncope, edema, weight gain, or early satiety.  All other systems reviewed and are otherwise negative except as noted above.  Physical Exam    VS:  BP 138/82   Pulse 75   Ht 5\' 10"  (1.778 m)   Wt 248 lb 3.2 oz (112.6 kg)   BMI 35.61 kg/m  , BMI Body mass index is 35.61 kg/m. GEN: Well nourished, well developed, in no acute distress.  HEENT: Expressive aphasia. Neck: Supple, no JVD, carotid bruits, or masses. Cardiac: RRR, 2/6 systolic ejection murmur at the bilateral upper sternal borders, no rubs, or gallops. No clubbing, cyanosis, edema.  Radials/DP/PT 2+ and equal bilaterally.  Respiratory:  Respirations regular and unlabored, clear to auscultation bilaterally. GI: Soft, nontender, nondistended, BS + x 4. MS: no deformity or atrophy. Skin: warm and dry, no rash. Neuro:  Strength and sensation are intact. Psych: Normal affect.  Accessory Clinical Findings    Catheterization reviewed in detail.  Assessment & Plan    1.  Coronary artery disease with stable angina: Patient was recently admitted with chest discomfort and ruled out. He underwent catheterization revealing stable occlusive disease involving the LAD and right coronary artery with left to left and left to right collaterals respectively. He has been medically managed with beta blocker, Plavix, ARB, and statin therapy. He has had occasional chest tightness when riding his recumbent bike but this does not limit his activity and resolve spontaneously. He has not had to take any sublingual nitroglycerin glycerin. We did discuss potentially adding a long-acting nitrate, however at this time, he does not feel significant bothered by the symptoms and would prefer not to add additional medicine. I advised  that this would be reasonable, however if he were to have worsening of symptoms or limitations in lifestyle, he should notify us so that we can take additional measures. Of note, he is not on aspirin in the setting of need for Plavix) recent stroke), and thrombocytopenia.  2. Hypertensive heart disease: Blood pressure is stable. His wife checks his pressure regular at home and it's typically in the 130s. He remains on beta blocker and ARB therapy.  3. Hyperlipidemia: He is on Lipitor therapy with an LDL of 94 on July 7.  4. Aortic stenosis: This is mild on recent echo. He does have a murmur.  5. Recent stroke: He appears to be recovering reasonably well. On Plavix and statin therapy and has completed outpatient rehabilitation.  6. Morbid obesity: He is actively trying to lose weight and is down roughly 20+ pounds. I encouraged him to continue in his efforts at calorie restriction and increasing his activities.  7. Disposition: Follow-up with Dr. Martinique in 3 months or sooner if necessary.   Murray Hodgkins, NP 12/13/2015, 2:12 PM

## 2015-12-26 ENCOUNTER — Ambulatory Visit (INDEPENDENT_AMBULATORY_CARE_PROVIDER_SITE_OTHER): Payer: Medicare Other | Admitting: Neurology

## 2015-12-26 ENCOUNTER — Encounter: Payer: Self-pay | Admitting: Neurology

## 2015-12-26 VITALS — BP 152/70 | Ht 70.0 in | Wt 247.0 lb

## 2015-12-26 DIAGNOSIS — I63219 Cerebral infarction due to unspecified occlusion or stenosis of unspecified vertebral arteries: Secondary | ICD-10-CM | POA: Diagnosis not present

## 2015-12-26 DIAGNOSIS — G4733 Obstructive sleep apnea (adult) (pediatric): Secondary | ICD-10-CM

## 2015-12-26 MED ORDER — ATORVASTATIN CALCIUM 80 MG PO TABS: 80.0000 mg | ORAL_TABLET | Freq: Every day | ORAL | 3 refills | Status: DC

## 2015-12-26 NOTE — Patient Instructions (Signed)
I had a long d/w patient and his wife about his recent stroke, risk for recurrent stroke/TIAs, personally independently reviewed imaging studies and stroke evaluation results and answered questions.Continue Plavix  for secondary stroke prevention and maintain strict control of hypertension with blood pressure goal below 130/90, diabetes with hemoglobin A1c goal below 6.5% and lipids with LDL cholesterol goal below 70 mg/dL. I also advised the patient to eat a healthy diet with plenty of whole grains, cereals, fruits and vegetables, exercise regularly and maintain ideal body weight .I recommend he increase the dose of Lipitor to 80 mg daily to aim for LDL cholesterol below 70 mg percent. I will also order a polysomnogram to check for sleep apnea. Patient is requesting referral to driver rehabilitation services in [place] for a comprehensive driving evaluation. Followup in the future with  my nurse practitioner in 6 months or call earlier if necessary.

## 2015-12-26 NOTE — Progress Notes (Signed)
[facility] [address] [place]. [place] [address] [phone]       OFFICE FOLLOW UP VISIT  NOTE  Jose. Jose Beck Date of Birth:  [birth year] Medical Record Number:  [id number]   Referring MD: Faustino Congress, PT  Reason for Referral:  stroke HPI:  Initial Consult note 09/17/15 : Jose Beck is a 37 year Caucasian male who is accompanied today by his wife. History is provided by the patient, wife as well as review of available medical records from [facility] in [place]. Patient was visiting [place] in [place] on 08/06/15 while at the visit presented as washing his hands noticed that all of a sudden the sound of the water was too loud. He also noticed that he was dizzy, off balance and had some slurred speech. EMS was called and patient was taken to the hospital. Patient was in the hospital for a few days. I do not have available detailed discharge summary and stroke workup details.. The only records provided to me included a report of a CT scan of the head on that day which showed no acute abnormality and remote age lacunar infarcts. 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 The mild atheromatous changes at carotid bifurcations as well as in the intracranial vessels. Patient was started on aspirin and Plavix which is tolerating well his had some bruising episodes particularly when he fell once. He is currently in outpatient physical and occupational therapy. He is been able to walk with a walker. He states his hearing and speech have improved though occasionally when he is tired he slows on few words. He still has imbalance but is careful and as long as he uses a walker is safe. He has no known prior history of strokes or TIAs. He does have multiple vascular risk factors in the form of diabetes, hypertension, hyperlipidemia, heart disease and  obesity. Is not been diagnosed with sleep apnea but does he does snore and does have daytime sleepiness and tiredness. The patient's wife did inform me that she has more detailed hospital records and she will drop them off for my review later. Update 12/26/2015 :   He returns for follow-up after last visit 3 months ago. Is accompanied by his wife. Patient continues to do well. He has discontinued aspirin and is on Plavix alone tolerating well without bleeding or bruising. He has brought his blood pressure log along which looks pretty well controlled though today it is slightly elevated at 152/70. Is tolerating Lipitor 40 mg well without any side effects. Lipid profile last checked on 11/06/15 shows total cholesterol of 147 but HDL is yet slightly elevated at 189 and HDL is 40 mg percent. Hemoglobin A1c 6.2. TSH 1.2. Patient had cardiac cath done on 11/16/15 by Dr. Daneen Schick which showed severe multivessel disease with chronic occlusions. He was recommended medical therapy. Patient states he's been eating healthy. His gradually lost 20 pounds over the last 6 months. He is also active and uses an exercise bike at home for 30 minutes. He has finished outpatient physical and occupation therapy. Still feels she has fine coordination in his right arm in slight gait imbalance. He wants to drive but wants me to refer him for a comprehensive evaluation at driver's rehabilitation services in [place] . He has not  had a sleep study yet unclear reasons. ROS:   14 system review of systems  is positive for slight incoordination and gait imbalance. All other systems negative. PMH:  Past Medical History:  Diagnosis Date  . Aortic stenosis    a. 11/2015 Echo: EF 55-60%, Gr1 DD, mild AS.  Marland Kitchen Chronic combined systolic and diastolic CHF (congestive heart failure) ([place])    a. 07/2015: TEE showing EF of 40% b. 11/2015: Echo w/ EF 55-60%, Grade 1 DD, mild AS.  Marland Kitchen Coronary artery disease    a. patient reports 100% stenosis of  LAD and RCA by cath in 1999. b. 11/2015 Cath: LM nl, LAD 100 - fills by L->L collats from D1, LCX 60d, OM1 lalrge/nl, OM2 small/nl, RCA 163m, L->R collats.EF 35-45% (55-60% by echo).  . Diabetes mellitus without complication ([place])   . GERD (gastroesophageal reflux disease)   . Hyperlipemia   . Hypertensive heart disease   . Stroke [facility])    a. 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 Thrombocytopenia ([place]) 11/2015    Social History:  Social History   Social History  . Marital status: Married    Spouse name: N/A  . Number of children: N/A  . Years of education: N/A   Occupational History  . Not on file.   Social History Main Topics  . Smoking status: Never Smoker  . Smokeless tobacco: Never Used  . Alcohol use 0.6 oz/week    1 Shots of liquor per week     Comment: cocktails every day  . Drug use: No  . Sexual activity: Not on file   Other Topics Concern  . Not on file   Social History Narrative  . No narrative on file    Medications:   Current Outpatient Prescriptions on File Prior to Visit  Medication Sig Dispense Refill  . carvedilol (COREG) 12.5 MG tablet Take 1 tablet (12.5 mg total) by mouth 2 (two) times daily. 60 tablet 6  . clopidogrel (PLAVIX) 75 MG tablet Take 1 tablet (75 mg total) by mouth daily. 30 tablet 1  . insulin detemir (LEVEMIR) 100 UNIT/ML injection Inject 0.22 mLs (22 Units total) into the skin 2 (two) times daily. 10 mL 11  . losartan (COZAAR) 50 MG tablet Take 1 tablet (50 mg total) by mouth daily. 30 tablet 6  . nitroGLYCERIN (NITROSTAT) 0.4 MG SL tablet Place 0.4 mg under the tongue every 5 (five) minutes as needed for chest pain (x 3 doses).     No current facility-administered medications on file prior to visit.     Allergies:   Allergies  Allergen Reactions  . Cheese Nausea And Vomiting  . Pravastatin     Anxiety attack    Physical Exam General: Obese middle-age Caucasian male seated, in no evident distress Head: head  normocephalic and atraumatic.   Neck: supple with no carotid or supraclavicular bruits Cardiovascular: regular rate and rhythm, no murmurs Musculoskeletal: no deformity Skin:  no rash/petichiae Vascular:  Normal pulses all extremities  Neurologic Exam Mental Status: Awake and fully alert. Oriented to place and time. Recent and remote memory intact. Attention span, concentration and fund of knowledge appropriate. Mood and affect appropriate.  Cranial Nerves: Fundoscopic exam not done  . Pupils equal, briskly reactive to light. Extraocular movements full without nystagmus. Visual fields full to confrontation. Hearing intact. Facial sensation intact. Face, tongue, palate moves normally and symmetrically.  Motor: Normal bulk and tone. Normal strength in all tested extremity muscles.Diminished fine finger movements on the right. Orbits left over right upper extremity. Sensory.: intact to touch , pinprick ,  position and vibratory sensation.  Coordination: Rapid alternating movements normal in all extremities. Finger-to-nose and heel-to-shin performed accurately bilaterally. Gait and Station: Arises from chair without difficulty. Stance is broad based. Gait demonstrates normal stride length and balance slight imbalance when he turns.. Unable to heel, toe and tandem walk without difficulty.  Reflexes: 1+ and symmetric. Toes downgoing.     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 Vascular risk factors of hypertension, hyperlipidemia, obesity and intracranial atherosclerosis. Suspected sleep apnea.    PLAN: I had a long d/w patient and his wife about his recent stroke, risk for recurrent stroke/TIAs, personally independently reviewed imaging studies and stroke evaluation results and answered questions.Continue Plavix  for secondary  stroke prevention and maintain strict control of hypertension with blood pressure goal below 130/90, diabetes with hemoglobin A1c goal below 6.5% and lipids with LDL cholesterol goal below 70 mg/dL. I also advised the patient to eat a healthy diet with plenty of whole grains, cereals, fruits and vegetables, exercise regularly and maintain ideal body weight .I recommend he increase the dose of Lipitor to 80 mg daily to aim for LDL cholesterol below 70 mg percent. I will also order a polysomnogram to check for sleep apnea. Patient is requesting referral to driver rehabilitation services in [place] for a comprehensive driving evaluation. Followup in the future with  my nurse practitioner in 6 months or call earlier if necessary.  Antony Contras, MD  [facility] [address] [place], [place] [address]  Phone [phone] Fax [phone]  Note: This document was prepared with digital dictation and possible smart phrase technology. Any transcriptional errors that result from this process are unintentional.

## 2015-12-28 NOTE — Progress Notes (Signed)
Referral sent to Driver Rehab on [id number].Progress notes from last office visit and demographic sheet fax along with referral.Referral sent to driver rehab at [id number] [phone].

## 2015-12-31 DIAGNOSIS — C44229 Squamous cell carcinoma of skin of left ear and external auricular canal: Secondary | ICD-10-CM | POA: Diagnosis not present

## 2016-01-01 ENCOUNTER — Other Ambulatory Visit: Payer: Self-pay | Admitting: *Deleted

## 2016-01-01 ENCOUNTER — Telehealth: Payer: Self-pay | Admitting: Neurology

## 2016-01-01 MED ORDER — CARVEDILOL 12.5 MG PO TABS: 12.5000 mg | ORAL_TABLET | Freq: Two times a day (BID) | ORAL | 1 refills | Status: DC

## 2016-01-01 MED ORDER — LOSARTAN POTASSIUM 50 MG PO TABS: 50.0000 mg | ORAL_TABLET | Freq: Every day | ORAL | 2 refills | Status: DC

## 2016-01-01 NOTE — Telephone Encounter (Signed)
If patient wife calls back they have the referral. IT takes 5 to 7 business for them to evaluate his referral. They have the referral.   Rn spoke with chantel at driver rehab. She stated the referral was receive. The patient is on the list to call today for an appointment. Rn stated the patients wife call today.

## 2016-01-01 NOTE — Telephone Encounter (Signed)
Patients wife left a msg on the refill vm requesting that these rx's be changed to a ninety day supply.

## 2016-01-01 NOTE — Telephone Encounter (Signed)
Wife [name] called to check status of form Dr. Leonie Man was going to send in for Driver [facility]. Advised this was sent to [facility] on August 18th.

## 2016-01-08 ENCOUNTER — Encounter: Payer: Self-pay | Admitting: Neurology

## 2016-01-08 ENCOUNTER — Ambulatory Visit (INDEPENDENT_AMBULATORY_CARE_PROVIDER_SITE_OTHER): Payer: Medicare Other | Admitting: Neurology

## 2016-01-08 VITALS — BP 160/91 | HR 64 | Resp 18 | Ht 70.0 in | Wt 246.0 lb

## 2016-01-08 DIAGNOSIS — R0683 Snoring: Secondary | ICD-10-CM

## 2016-01-08 DIAGNOSIS — I639 Cerebral infarction, unspecified: Secondary | ICD-10-CM

## 2016-01-08 DIAGNOSIS — I63219 Cerebral infarction due to unspecified occlusion or stenosis of unspecified vertebral arteries: Secondary | ICD-10-CM

## 2016-01-08 DIAGNOSIS — R351 Nocturia: Secondary | ICD-10-CM | POA: Diagnosis not present

## 2016-01-08 NOTE — Patient Instructions (Signed)

## 2016-01-08 NOTE — Progress Notes (Signed)
Subjective:    Patient ID: Jose Beck is a 70 y.o. male.  HPI     Star Age, MD, PhD [facility] [address] [place], [place] [address]  Dear Mamie Nick,   I saw your patient, Jose Beck, upon your kind request in my clinic today for initial consultation of his sleep disorder, in particular, concern for underlying obstructive sleep apnea. The patient is accompanied by his wife today today. As you know, 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  I reviewed your office note from 12/26/2015. His Epworth sleepiness score is 4 out of 24 today, his fatigue score is 9 out of 63.  He has residual dysartria and some weakness in the RUE>RLE and while he has not fallen, he has to be careful coming down stairs.  He has been able to lose weight, max wt was listed at 279 lb in April 2017.  He has no obvious FHx of OSA, has 2 half-sisters, father died at 19 from heart disease. Mother died at 68 from CHF.  He snores some but This has improved since his weight loss according to his wife. She has in the past noted apneic pauses while he is asleep. He denies restless leg symptoms but wife has noted infrequent leg movements in his sleep. He has nocturia about twice per average night, at least once. He denies morning headaches. He goes to bed around 11 and wake up time is between 6 and 7 AM. He is retired. He lives with his wife, they have no children. He is a never smoker, drinks alcohol daily about 5-6-1/2 ounces on average. He does not drink much in the way of water, likes to drink soda, tea, and coffee.  His Past Medical History Is  Significant For: Past Medical History:  Diagnosis Date  . Aortic stenosis    a. 11/2015 Echo: EF 55-60%, Gr1 DD, mild AS.  Marland Kitchen Chronic combined systolic and diastolic CHF (congestive heart failure) ([place])    a. 07/2015: TEE showing EF of 40% b. 11/2015: Echo w/ EF 55-60%, Grade 1 DD, mild AS.  Marland Kitchen Coronary artery disease    a. patient reports 100% stenosis of LAD and RCA by cath in 1999. b. 11/2015 Cath: LM nl, LAD 100 - fills by L->L collats from D1, LCX 60d, OM1 lalrge/nl, OM2 small/nl, RCA 157m, L->R collats.EF 35-45% (55-60% by echo).  . Diabetes mellitus without complication ([place])   . GERD (gastroesophageal reflux disease)   . Hyperlipemia   . Hypertension   . Hypertensive heart disease   . Stroke [facility])    a. 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 Thrombocytopenia ([place]) 11/2015    His Past Surgical History Is Significant For: Past Surgical History:  Procedure Laterality Date  . CARDIAC CATHETERIZATION    . CARDIAC CATHETERIZATION N/A 11/16/2015   Procedure: Left Heart Cath and Coronary Angiography;  Surgeon: [name], MD;  Location: [facility];  Service: Cardiovascular;  Laterality: N/A;  . MOLE REMOVAL    . TONSILLECTOMY      His Family History Is Significant For: Family History  Problem Relation Age of Onset  . Hypertension Mother   . Heart disease Mother  His Social History Is Significant For: Social History   Social History  . Marital status: Married    Spouse name: N/A  . Number of children: 0  . Years of education: College   Occupational History  . Retired     Social History Main Topics  . Smoking status: Never Smoker  . Smokeless tobacco: Never Used  . Alcohol use 0.6 oz/week    1 Shots of liquor per week     Comment: cocktails every day  . Drug use: No  . Sexual activity: Not Asked   Other Topics Concern  . None   Social History Narrative   Drinks coffee daily     His Allergies Are:  Allergies  Allergen Reactions  . Cheese  Nausea And Vomiting  . Pravastatin     Anxiety attack  :   His Current Medications Are:  Outpatient Encounter Prescriptions as of 01/08/2016  Medication Sig  . atorvastatin (LIPITOR) 80 MG tablet Take 1 tablet (80 mg total) by mouth daily at 6 PM.  . carvedilol (COREG) 12.5 MG tablet Take 1 tablet (12.5 mg total) by mouth 2 (two) times daily.  . clopidogrel (PLAVIX) 75 MG tablet Take 1 tablet (75 mg total) by mouth daily.  . insulin detemir (LEVEMIR) 100 UNIT/ML injection Inject 0.22 mLs (22 Units total) into the skin 2 (two) times daily.  Marland Kitchen LEVEMIR FLEXTOUCH 100 UNIT/ML Pen   . losartan (COZAAR) 50 MG tablet Take 1 tablet (50 mg total) by mouth daily.  . nitroGLYCERIN (NITROSTAT) 0.4 MG SL tablet Place 0.4 mg under the tongue every 5 (five) minutes as needed for chest pain (x 3 doses).   No facility-administered encounter medications on file as of 01/08/2016.   :  Review of Systems:  Out of a complete 14 point review of systems, all are reviewed and negative with the exception of these symptoms as listed below: Review of Systems  Neurological:       Wife states that patient has lost weight, occasional snoring, wife has noticed apnea event in past, takes naps.    Epworth Sleepiness Scale 0= would never doze 1= slight chance of dozing 2= moderate chance of dozing 3= high chance of dozing  Sitting and reading:1 Watching TV:1 Sitting inactive in a public place (ex. Theater or meeting):0 As a passenger in a car for an hour without a break:1 Lying down to rest in the afternoon:0 Sitting and talking to someone:0 Sitting quietly after lunch (no alcohol):1 In a car, while stopped in traffic:0 Total: 4  Objective:  Neurologic Exam  Physical Exam Physical Examination:   Vitals:   01/08/16 1059  BP: (!) 160/91  Pulse: 64  Resp: 18   General Examination: The patient is a very pleasant 70 y.o. male in no acute distress. He appears well-developed and well-nourished and well  groomed.   HEENT: Normocephalic, atraumatic, pupils are equal, round and reactive to light and accommodation. Extraocular tracking is good without limitation to gaze excursion or nystagmus noted. Normal smooth pursuit is noted. Hearing is grossly intact. Face is symmetric with normal facial animation and normal facial sensation. Speech is dysarthric noted. There is no hypophonia. There is no lip, neck/head, jaw or voice tremor. Neck is supple with full range of passive and active motion. There are no carotid bruits on auscultation. Oropharynx exam reveals: mild mouth dryness, adequate dental hygiene and moderate airway crowding, due to longer uvula, larger tongue and thicker soft palate, smaller airway entry. Mallampati is class  II. Tongue protrudes centrally and palate elevates symmetrically. Tonsils are absent. Neck size is 18 3/8 inches. He has a Mild overbite.   Chest: Clear to auscultation without wheezing, rhonchi or crackles noted.  Heart: S1+S2+0, regular and normal without murmurs, rubs or gallops noted.   Abdomen: Soft, non-tender and non-distended with normal bowel sounds appreciated on auscultation.  Extremities: There is no pitting edema in the distal lower extremities bilaterally. Pedal pulses are intact.  Skin: Warm and dry without trophic changes noted. There are no varicose veins, mild, chronic-appearing discoloration in the distal legs noted.  Musculoskeletal: exam reveals no obvious joint deformities, tenderness or joint swelling or erythema.   Neurologically:  Mental status: The patient is awake, alert and oriented in all 4 spheres. His immediate and remote memory, attention, language skills and fund of knowledge are appropriate. There is no evidence of aphasia, agnosia, apraxia or anomia. Speech is clear with normal prosody and enunciation. Thought process is linear. Mood is normal and affect is normal.  Cranial nerves II - XII are as described above under HEENT exam. In  addition: shoulder shrug is normal with equal shoulder height noted. Motor exam: Normal bulk, strength and tone is noted on the L, he has very mild weakness in the right lower extremity and mild weakness in the right upper extremity. He has mild rebound in the right. Romberg is not testable. Fine motor skills are impaired on the right with abnormal finger taps and abnormal rapid alternating patting, he does have a past pointing on the right and lower extremity coordination as well as heel-to-shin on the right is also mildly abnormal. He has mild dysmetria and mild intention tremor. Reflexes are 2+ throughout.  Sensory exam: intact to light touch in the upper and lower extremities.  Gait, station and balance: He stands with difficulty. He stands wide-based, he has very mild limp on the right, he has a decreased arm swing and posturing with his right upper extremity while walking. Tandem walk is not possible.   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 I had a long chat with the patient and his wife about my findings and the diagnosis of OSA, its prognosis and treatment options. We talked about medical treatments, surgical interventions and non-pharmacological approaches. I explained in particular the risks and ramifications of untreated moderate to severe OSA, especially with respect to developing cardiovascular disease down the Road, including congestive heart failure, difficult to treat hypertension, cardiac arrhythmias, or stroke. Even type 2 diabetes has, in part, been linked to untreated OSA. Symptoms of  untreated OSA include daytime sleepiness, memory problems, mood irritability and mood disorder such as depression and anxiety, lack of energy, as well as recurrent headaches, especially morning headaches. We talked about trying to maintain a healthy lifestyle in general, as well as the importance of weight control. I encouraged the patient to eat healthy, exercise daily and keep well hydrated, to keep a scheduled bedtime and wake time routine, to not skip any meals and eat healthy snacks in between meals. I advised the patient not to drive when feeling sleepy. He is currently not driving. He is furthermore advised to decrease  his caffeine intake and increase his water intake. He is advised that alcohol in any quantity may impair his balance and coordination further.  I recommended the following at this time: sleep study with potential positive airway pressure titration. (We will score hypopneas at 4% and split the sleep study into diagnostic and treatment portion, if the estimated. 2 hour AHI is >15/h).   I explained the sleep test procedure to the patient and also outlined possible surgical and non-surgical treatment options of OSA, including the use of a custom-made dental device (which would require a referral to a specialist dentist or oral surgeon), upper airway surgical options, such as pillar implants, radiofrequency surgery, tongue base surgery, and UPPP (which would involve a referral to an ENT surgeon). Rarely, jaw surgery such as mandibular advancement may be considered.  I also explained the CPAP treatment option to the patient, who indicated that he would be willing to try CPAP if the need arises. I explained the importance of being compliant with PAP treatment, not only for insurance purposes but primarily to improve His symptoms, and for the patient's long term health benefit, including to reduce His cardiovascular risks. I answered all their questions today and the patient and his wife were in  agreement. I would like to see him back after the sleep study is completed and encouraged him to call with any interim questions, concerns, problems or updates.   Thank you very much for allowing me to participate in the care of this nice patient. If I can be of any further assistance to you please do not hesitate to talk to me.  Sincerely,   Star Age, MD, PhD

## 2016-01-24 ENCOUNTER — Ambulatory Visit (INDEPENDENT_AMBULATORY_CARE_PROVIDER_SITE_OTHER): Payer: Medicare Other | Admitting: Neurology

## 2016-01-24 DIAGNOSIS — G4761 Periodic limb movement disorder: Secondary | ICD-10-CM | POA: Diagnosis not present

## 2016-01-24 DIAGNOSIS — R351 Nocturia: Secondary | ICD-10-CM

## 2016-01-24 DIAGNOSIS — R9431 Abnormal electrocardiogram [ECG] [EKG]: Secondary | ICD-10-CM

## 2016-01-24 DIAGNOSIS — G4720 Circadian rhythm sleep disorder, unspecified type: Secondary | ICD-10-CM

## 2016-01-31 ENCOUNTER — Telehealth: Payer: Self-pay | Admitting: Neurology

## 2016-01-31 NOTE — Telephone Encounter (Signed)
I spoke to patient and wife. They are aware of results and were able to make appt for next week. I will send copy of report to PCP.

## 2016-01-31 NOTE — Telephone Encounter (Signed)
Patient referred by Dr. Leonie Man, seen by me on 01/08/16, diagnostic PSG on 914/17.   Please call and notify the patient that the recent sleep study did not show any significant obstructive sleep apnea. However, he did not sleep very well and had frequent leg twitching. Please inform patient that I would like to go over the details of the study during a follow up appointment. Arrange a followup appointment. Also, route or fax report to PCP and referring MD, if other than PCP.  Once you have spoken to patient, you can close this encounter.   Thanks,  Star Age, MD, PhD [facility])

## 2016-01-31 NOTE — Progress Notes (Signed)
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 He is 70 inches tall and a reported weight of 247 pounds, BMI 35.3.  Neck size 18 inches.  Medications include:  Lipitor, Coreg, Plavix, Levemir, Cozaar, Nitrostat.  On the night of the study, the Epworth Sleepiness Scale was 4.  The patient underwent an attended diagnostic digital polysomnography with the simultaneous recording of electroencephalogram, electro-oculogram, electromyogram, electrocardiogram, respiratory effort, thermister respiratory flow, nasal pressure, pulse oximetry, leg movement, body position, sound, and video.  The data was adequate for interpretation; sleep scoring followed the standards put forth by the American Academy of Sleep Medicine (AASM). ?????  SLEEP ARCHITECTURE The total time in bed was 481 minutes, the total sleep time was 169.5 minutes and WASO was 0.5 minutes.  Sleep efficiency was markedly reduced at 35.2%.  The sleep latency was 37 minutes.  Latency to persistent sleep 43 minutes. Arousal Index was 40/hr and spontaneous arousal index was 16.6/hr.  While asleep, the patient spent 11.8% of the time in stage N1 sleep, 26.8% of the time in stage N2 sleep, 44.2% of the time in stage N3 sleep, which is increased, and 17.1% of the time in REM sleep. The REM latency was 414, which is markedly prolonged.  BODY POSITION The patient spent 49 minutes in the supine position, 120.5 minutes on the left side, 0 minutes on the right side, and 0 minutes in the prone position.  RESPIRATORY  PARAMETERS There were 0 obstructive apneas, 2 hypopneas, 0 central apneas, and 0 mixed apneas.  The overall apnea-hypopnea index (AHI) was .7.  The AHI supine was 2.4, the non-supine was 0 and the REM AHI was 2.1.  The respiratory disturbance index (RDI) was .7.  The patient's baseline O2 saturation while awake was 97% and the lowest O2 saturation observed was 86%.  The percentage of sleep time with O2 saturation at 89% or lower was .9%.      Audio and Video analysis did not show any abnormal or unusual behaviors, movements, phonations or vocalizations. He had 3 bathroom breaks.   LEG MOVEMENTS There were 212 periodic leg movements. PLMs, were observed at an average of 75 per hour while asleep and a PLM arousal index of 21.9 per hour.    CARDIAC EVENTS The average heart rate during sleep was 66 bpm with the highest being 73 bpm and the lowest being 57 bpm. Frequent, multifocal PVCs were noted on single lead EKG.  IMPRESSION  Periodic Limb Movement Disorder Dysfunctions associated with sleep stages or arousal from sleep Non-specific abnormal EKG Nocturia  COMMENTS  1. This study did not suggest any significant sleep disordered breathing; but was limited due to poor sleep efficiency and poor sleep consolidation, including nocturia.  2. The EKG showed frequent PVCs; clinical correlation is recommended.   3. Severe PLMs were noted with moderate arousals - clinical correlation is recommended.  4. For all patients we encourage proper "sleep hygiene."  This includes setting a regular sleep and rise time, avoiding caffeine, alcohol and other medications that may affect sleep prior to bedtime and creating a comfortable bedroom environment for sleeping.   5. Adherence  to appropriate precautions regarding sleepiness and daytime functioning is recommended.   6. The patient and his referring physician will be notified of the test results. ?????     _______________________________  Star Age,  MD Diplomate, American Board of Sleep Medicine and Neurology

## 2016-02-05 ENCOUNTER — Telehealth: Payer: Self-pay

## 2016-02-05 NOTE — Telephone Encounter (Signed)
I spoke to wife and was able to reschedule appt, due to provider out sick. I will call back if something opens sooner

## 2016-02-06 ENCOUNTER — Ambulatory Visit: Payer: Self-pay | Admitting: Neurology

## 2016-02-06 NOTE — Telephone Encounter (Signed)
I called patient back, wife answered. They were able to take sooner appt.

## 2016-02-11 ENCOUNTER — Ambulatory Visit (INDEPENDENT_AMBULATORY_CARE_PROVIDER_SITE_OTHER): Payer: Medicare Other | Admitting: Neurology

## 2016-02-11 ENCOUNTER — Encounter: Payer: Self-pay | Admitting: Neurology

## 2016-02-11 VITALS — BP 189/97 | HR 68 | Resp 18 | Ht 70.0 in | Wt 243.0 lb

## 2016-02-11 DIAGNOSIS — I639 Cerebral infarction, unspecified: Secondary | ICD-10-CM

## 2016-02-11 DIAGNOSIS — I63219 Cerebral infarction due to unspecified occlusion or stenosis of unspecified vertebral arteries: Secondary | ICD-10-CM

## 2016-02-11 DIAGNOSIS — I493 Ventricular premature depolarization: Secondary | ICD-10-CM | POA: Diagnosis not present

## 2016-02-11 DIAGNOSIS — G4761 Periodic limb movement disorder: Secondary | ICD-10-CM

## 2016-02-11 NOTE — Patient Instructions (Signed)
Your sleep study did not show any significant sleep apnea.  However, you did not sleep very well.  You have frequent PVCs - extra heart beats. May need to be monitored.  You had restless sleep and leg movements in sleep. You do not endorse restless legs symptoms.   Please remember to try to maintain good sleep hygiene, which means: Keep a regular sleep and wake schedule, try not to exercise or have a meal within 2 hours of your bedtime, try to keep your bedroom conducive for sleep, that is, cool and dark, without light distractors such as an illuminated alarm clock, and refrain from watching TV right before sleep or in the middle of the night and do not keep the TV or radio on during the night. Also, try not to use or play on electronic devices at bedtime, such as your cell phone, tablet PC or laptop. If you like to read at bedtime on an electronic device, try to dim the background light as much as possible. Do not eat in the middle of the night.   I can see you back as needed.

## 2016-02-11 NOTE — Progress Notes (Signed)
Is no Subjective:    Patient ID: Jose Beck is a 70 y.o. male.  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 The patient is accompanied by his wife again today. I first met him on 01/08/2016 at the request of Dr. Leonie Man, at which time the patient reported snoring and excessive daytime somnolence. I invited him for sleep study. He had a baseline sleep study on 01/24/2016. I went over his test results in detail today. Sleep efficiency was markedly reduced at 35.2%, sleep latency was 37 minutes and wake after sleep onset was elevated. He had an increased percentage of stage I sleep, and a prolonged REM latency. He had frequent multifocal PVCs on EKG. He had severe leg movements with an index of 75 per hour, PLM arousal index was 21.9 per hour. He had 3 episodes of nocturia.   Today, 02/11/2016: He reports that he was quite uncomfortable during the sleep study. He was uncomfortable with the wires and sensors. Especially the thoracic and abdominal pelvis bothered him. He denies any restless leg symptoms. Wife is not aware of any significant leg twitching at night. He is trying to exercise regularly and is working on his gait and balance are fine motor skills. He does not always drink enough water he admits. He has had no recent falls. He saw Dr. Martinique in July 2017 and has a follow-up appointment in December. Patient denies any recent chest pain or shortness of breath. Echocardiogram in July showed reduced EF and mild aortic stenosis.  Previously:   01/08/2016: He reports snoring and excessive daytime somnolence.  I reviewed your office note  from 12/26/2015. His Epworth sleepiness score is 4 out of 24 today, his fatigue score is 9 out of 63.   He has residual dysartria and some weakness in the RUE>RLE and while he has not fallen, he has to be careful coming down stairs.   He has been able to lose weight, max wt was listed at 279 lb in April 2017.  He has no obvious FHx of OSA, has 2 half-sisters, father died at 3 from heart disease. Mother died at 75 from CHF.  He snores some but This has improved since his weight loss according to his wife. She has in the past noted apneic pauses while he is asleep. He denies restless leg symptoms but wife has noted infrequent leg movements in his sleep. He has nocturia about twice per average night, at least once. He denies morning headaches. He goes to bed around 11 and wake up time is between 6 and 7 AM. He is retired. He lives with his wife, they have no children. He is a never smoker, drinks alcohol daily about 5-6-1/2 ounces on average. He does not drink much in the way of water, likes to drink soda, tea, and coffee.   His Past Medical History Is Significant For: Past Medical History:  Diagnosis Date  . Aortic stenosis    a. 11/2015 Echo: EF 55-60%, Gr1 DD, mild AS.  Marland Kitchen Chronic combined systolic and diastolic CHF (congestive heart failure) ([place])    a. 07/2015: TEE showing EF of 40% b. 11/2015: Echo w/ EF 55-60%, Grade 1 DD, mild AS.  Marland Kitchen  Coronary artery disease    a. patient reports 100% stenosis of LAD and RCA by cath in 1999. b. 11/2015 Cath: LM nl, LAD 100 - fills by L->L collats from D1, LCX 60d, OM1 lalrge/nl, OM2 small/nl, RCA 185m L->R collats.EF 35-45% (55-60% by echo).  . [place]   . GERD (gastroesophageal reflux disease)   . Hyperlipemia   . Hypertension   . Hypertensive heart disease   . Stroke ([facility]    a. 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 [place]rombocytopenia [place] 11/2015    His Past Surgical History Is Significant For: Past  Surgical History:  Procedure Laterality Date  . CARDIAC CATHETERIZATION    . CARDIAC CATHETERIZATION N/A 11/16/2015   Procedure: Left Heart Cath and Coronary Angiography;  Surgeon: HBelva Crome MD;  [facility];  Service: Cardiovascular;  Laterality: N/A;  . MOLE REMOVAL    . TONSILLECTOMY      His Family History Is Significant For: Family History  Problem Relation Age of Onset  . Hypertension Mother   . Heart disease Mother     His Social History Is Significant For: Social History   Social History  . Marital status: Married    Spouse name: N/A  . Number of children: 0  . Years of education: College   Occupational History  . Retired     Social History Main Topics  . Smoking status: Never Smoker  . Smokeless tobacco: Never Used  . Alcohol use 0.6 oz/week    1 Shots of liquor per week     Comment: cocktails every day  . Drug use: No  . Sexual activity: Not Asked   Other Topics Concern  . None   Social History Narrative   Drinks coffee daily     His Allergies Are:  Allergies  Allergen Reactions  . Cheese Nausea And Vomiting  . Pravastatin     Anxiety attack  :   His Current Medications Are:  Outpatient Encounter Prescriptions as of 02/11/2016  Medication Sig  . atorvastatin (LIPITOR) 80 MG tablet Take 1 tablet (80 mg total) by mouth daily at 6 PM.  . carvedilol (COREG) 12.5 MG tablet Take 1 tablet (12.5 mg total) by mouth 2 (two) times daily.  . clopidogrel (PLAVIX) 75 MG tablet Take 1 tablet (75 mg total) by mouth daily.  . insulin detemir (LEVEMIR) 100 UNIT/ML injection Inject 0.22 mLs (22 Units total) into the skin 2 (two) times daily.  .Marland KitchenLEVEMIR FLEXTOUCH 100 UNIT/ML Pen   . losartan (COZAAR) 50 MG tablet Take 1 tablet (50 mg total) by mouth daily.  . nitroGLYCERIN (NITROSTAT) 0.4 MG SL tablet Place 0.4 mg under the tongue every 5 (five) minutes as needed for chest pain (x 3 doses).   No facility-administered encounter medications on  file as of 02/11/2016.   :  Review of Systems:  Out of a complete 14 point review of systems, all are reviewed and negative with the exception of these symptoms as listed below: Review of Systems  Neurological:       Patient is here to discuss sleep study, no new concerns.     Objective:  Neurologic Exam  Physical Exam Physical Examination:   Vitals:   02/11/16 1625  BP: (!) 189/97  Pulse: 68  Resp: 18   General Examination: The patient is a very pleasant 70y.o. male in no acute distress. He appears well-developed and well-nourished and well groomed.  HEENT: Normocephalic, atraumatic, pupils are equal, round and reactive to light and accommodation. Extraocular tracking is good without limitation to gaze excursion or nystagmus noted. Normal smooth pursuit is noted. Hearing is grossly intact. Face is symmetric with normal facial animation and normal facial sensation. Speech is dysarthric. There is no hypophonia. There is no lip, neck/head, jaw or voice tremor. Neck is supple with full range of passive and active motion. There are no carotid bruits on auscultation. Oropharynx exam reveals: mild mouth dryness, adequate dental hygiene and moderate airway crowding, due to longer uvula, larger tongue and thicker soft palate, smaller airway entry. Mallampati is class II. Tongue protrudes centrally and palate elevates symmetrically. Tonsils are absent.   Chest: Clear to auscultation without wheezing, rhonchi or crackles noted.  Heart: S1+S2+0, regular with faint systolic murmur noted.   Abdomen: Soft, non-tender and non-distended with normal bowel sounds appreciated on auscultation.  Extremities: There is no pitting edema in the distal lower extremities bilaterally. Pedal pulses are intact.  Skin: Warm and dry without trophic changes noted. There are no varicose veins, mild, chronic-appearing discoloration in the distal legs noted.  Musculoskeletal: exam reveals no obvious joint  deformities, tenderness or joint swelling or erythema.   Neurologically:  Mental status: The patient is awake, alert and oriented in all 4 spheres. His immediate and remote memory, attention, language skills and fund of knowledge are appropriate. There is no evidence of aphasia, agnosia, apraxia or anomia. Speech is clear with normal prosody and enunciation. Thought process is linear. Mood is normal and affect is normal.  Cranial nerves II - XII are as described above under HEENT exam. In addition: shoulder shrug is normal with equal shoulder height noted. Motor exam: Normal bulk, strength and tone is noted on the L, he has very mild weakness in the right lower extremity and mild weakness in the right upper extremity.  Romberg is not testable. Fine motor skills are impaired on the right and normal on the L. Reflexes are 2+ throughout.  Sensory exam: intact to light touch in the upper and lower extremities.  Gait, station and balance: He stands with difficulty. He stands wide-based, he has very mild limp on the right, he has a decreased arm swing and posturing with his right upper extremity while walking. Tandem walk is not possible.   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 This unfortunately showed reduced sleep efficiency and poor sleep consolidation. He had nocturia 3 times. EKG showed frequent multifocal PVCs. He is advised to discuss this with his cardiologist as well; I will copy Dr. Martinique on my note. He denies any cardiac symptoms recently including chest pain, shortness of breath, palpitations. His  limited sleep study did not show any significant sleep disordered breathing. Time below 89% saturation was less then 1%, AHI was 0.7 per hour. He had severe PLMS with an index of 75 per hour, PLM arousal index was 21.9 per hour, however he does not endorse any restless leg symptoms and significant PLMS on a night to night basis at home. At this juncture, we can monitor for symptoms of restless leg syndrome. He has an appointment with Cecille Rubin and  can also follow-up with Dr. Leonie Man. We talked about maintaining good sleep hygiene. In addition, he is advised to stay active physically, of course within his limitations and stay well-hydrated with water. From my end of things, I can see him back on an as-needed basis. I answered all her questions today and the patient and his wife were in agreement.  I spent 25 minutes in total face-to-face time with the patient, more than 50% of which was spent in counseling and coordination of care, reviewing test results, reviewing medication and discussing or reviewing the diagnosis of PLMD, PVCs. the prognosis and treatment options.

## 2016-02-14 ENCOUNTER — Ambulatory Visit: Payer: Self-pay | Admitting: Neurology

## 2016-02-23 DIAGNOSIS — Z23 Encounter for immunization: Secondary | ICD-10-CM | POA: Diagnosis not present

## 2016-03-18 DIAGNOSIS — E119 Type 2 diabetes mellitus without complications: Secondary | ICD-10-CM | POA: Diagnosis not present

## 2016-04-16 NOTE — Progress Notes (Signed)
Office Visit    Patient Name: Jose Beck Date of Encounter: 04/18/2016  Primary Care Provider:  Geoffery Lyons, MD Primary Cardiologist:  P. Martinique, MD   Chief Complaint    70 year old male with a prior history of CAD, hypertension, hyperlipidemia, and stroke who presents for follow up CAD.  Past Medical History    Past Medical History:  Diagnosis Date  . Aortic stenosis    a. 11/2015 Echo: EF 55-60%, Gr1 DD, mild AS.  Marland Kitchen Chronic combined systolic and diastolic CHF (congestive heart failure) ([place])    a. 07/2015: TEE showing EF of 40% b. 11/2015: Echo w/ EF 55-60%, Grade 1 DD, mild AS.  Marland Kitchen Coronary artery disease    a. patient reports 100% stenosis of LAD and RCA by cath in 1999. b. 11/2015 Cath: LM nl, LAD 100 - fills by L->L collats from D1, LCX 60d, OM1 lalrge/nl, OM2 small/nl, RCA 123m, L->R collats.EF 35-45% (55-60% by echo).  . Diabetes mellitus without complication ([place])   . GERD (gastroesophageal reflux disease)   . Hyperlipemia   . Hypertension   . Hypertensive heart disease   . Stroke [facility])    a. 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 Thrombocytopenia ([place]) 11/2015   Past Surgical History:  Procedure Laterality Date  . CARDIAC CATHETERIZATION    . CARDIAC CATHETERIZATION N/A 11/16/2015   Procedure: Left Heart Cath and Coronary Angiography;  Surgeon: [name], MD;  Location: [place] [facility];  Service: Cardiovascular;  Laterality: N/A;  . MOLE REMOVAL    . TONSILLECTOMY      Allergies  Allergies  Allergen Reactions  . Cheese Nausea And Vomiting  . Pravastatin     Anxiety attack    History of Present Illness    70 year old male with prior history of CAD and known occlusive disease involving the right coronary artery and LAD with collateral circulation. This dates to 62 when he had a cardiac cath by Dr. Wynonia Lawman. He was then followed by Dr. Mare Ferrari. He has been medically managed over the years. He also has a history of hypertension,  hyperlipidemia, obesity, and suffered a cerebellar stroke in March 2017.   He was admitted to [facility] in July with chest pain and ruled out. He subsequently underwent catheterization revealing occluded LAD with left to left collaterals and also an occluded right coronary artery with left-to-right collaterals. EF was depressed on left ventriculogram 40%. Echo was read as normal EF. On my personal review his Echo was not normal but demostrated anteroseptal and apical HK, inferoapical AK and EF 40-45%. Medical therapy was recommended and his regimen was changed to include carvedilol and losartan. He had previously been on diltiazem, and this was continued.  On follow up today he is doing well. He has some residual dysarthria and some co-ordination difficulty with right hand. He is walking better but has to be careful on stairs. He is driving. He exercises 5 days a week on a recumbent bike for 50 minutes. He denies any SOB, CP, dizziness, palpitations, or edema. BP records at home demonstrate persistent elevation in the 140-150s range with some readings into 160s.   Home Medications    Prior to Admission medications   Medication Sig Start Date End Date Taking? Authorizing Provider  atorvastatin (LIPITOR) 40 MG tablet Take 1 tablet (40 mg total) by mouth daily at 6 PM. 08/17/15  Yes Daniel J Angiulli, PA-C  carvedilol (COREG) 12.5 MG tablet Take 1 tablet (12.5 mg  total) by mouth 2 (two) times daily. 11/27/15  Yes Alitzel Cookson M Martinique, MD  clopidogrel (PLAVIX) 75 MG tablet Take 1 tablet (75 mg total) by mouth daily. 08/17/15  Yes Daniel J Angiulli, PA-C  insulin detemir (LEVEMIR) 100 UNIT/ML injection Inject 0.22 mLs (22 Units total) into the skin 2 (two) times daily. 08/17/15  Yes Daniel J Angiulli, PA-C  losartan (COZAAR) 50 MG tablet Take 1 tablet (50 mg total) by mouth daily. 11/27/15  Yes Brok Stocking M Martinique, MD  nitroGLYCERIN (NITROSTAT) 0.4 MG SL tablet Place 0.4 mg under the tongue every 5 (five) minutes as needed for  chest pain (x 3 doses).   Yes Historical Provider, MD    Review of Systems    As noted in HPI. All other systems reviewed and are otherwise negative except as noted above.  Physical Exam    VS:  BP 132/86   Pulse 74   Ht 5\' 10"  (1.778 m)   Wt 241 lb 8 oz (109.5 kg)   BMI 34.65 kg/m  , BMI Body mass index is 34.65 kg/m. GEN: Well nourished, well developed, in no acute distress.  HEENT: Expressive aphasia. Neck: Supple, no JVD, carotid bruits, or masses. Cardiac: RRR, 2/6 systolic ejection murmur at the bilateral upper sternal borders, no rubs, or gallops. No clubbing, cyanosis, edema.  Radials/DP/PT 2+ and equal bilaterally.  Respiratory:  Respirations regular and unlabored, clear to auscultation bilaterally. GI: Soft, nontender, nondistended, BS + x 4. MS: no deformity or atrophy. Skin: warm and dry, no rash. Neuro:  Strength and sensation are intact. Psych: Normal affect.  Accessory Clinical Findings    Lab Results  Component Value Date   WBC 4.4 11/16/2015   HGB 14.8 11/16/2015   HCT 42.1 11/16/2015   PLT 107 (L) 11/16/2015   GLUCOSE 136 (H) 11/16/2015   CHOL 151 11/16/2015   TRIG 57 11/16/2015   HDL 46 11/16/2015   LDLCALC 94 11/16/2015   ALT 60 08/10/2015   AST 59 (H) 08/10/2015   NA 133 (L) 11/16/2015   K 3.7 11/16/2015   CL 101 11/16/2015   CREATININE 0.65 11/16/2015   BUN 13 11/16/2015   CO2 21 (L) 11/16/2015   INR 1.13 11/16/2015   HGBA1C 6.2 (H) 11/16/2015     Catheterization reviewed : Conclusion   1. Mid RCA lesion, 100% stenosed. 2. Ost 1st Diag to 1st Diag lesion, 70% stenosed. 3. Mid LAD lesion, 100% stenosed. 4. Dist RCA lesion, 100% stenosed. 5. Dist Cx lesion, 60% stenosed.    Total occlusion of the proximal RCA. RCA is heavily calcified. RCA fills by collaterals from the circumflex coronary of the left system. The right coronary is large in distribution.  Total occlusion of the proximal to mid LAD after the origin of the first of  perforator and first diagonal. Left to left collateral supply the LAD and a second diagonal. The first diagonal contains eccentric 50-70% narrowing.  Widely patent circumflex including a small ramus intermedius/first obtuse marginal, and a large branching second obtuse marginal. The distal circumflex beyond the second marginal is small and contains 50-70% narrowing. The circumflex is the source of collaterals to the distal right coronary.  Mildly depressed LV function with estimated EF in the 35 to 40% range. LVEDP is mildly elevated. The inferior wall is hypokinetic.   RECOMMENDATIONS:   Images were reviewed with Dr. Martinique. The plan at this time is to continue medical therapy unless symptoms progress.  Aggressive risk factor modification.  Heart failure  therapy as indicated.     Assessment & Plan    1.  Coronary artery disease with stable angina: Chronic occlusion of LAD and RCA since 1992. Collaterals from a large first diagonal and OM.  He has been medically managed with beta blocker, Plavix, ARB, and statin therapy. He is having minimal symptoms. Continue medical approach.  2. Hypertensive heart disease:  Recommend increase in Coreg to 25 mg bid.   3. Hyperlipidemia: He is on Lipitor therapy with an LDL of 94 on July 7. Dose was increased to 80 mg daily at that time. Need to repeat fasting lab. This will be arranged with Dr. Jacquiline Doe office.  4. Aortic stenosis: This is mild on  echo.   5. S/p cerebellar stroke: He appears to be recovering reasonably well. On Plavix and statin therapy and has completed outpatient rehabilitation.  Follow up in 6 months.   Zykee Avakian Martinique, MD,FACC 04/18/2016, 1:56 PM

## 2016-04-18 ENCOUNTER — Encounter: Payer: Self-pay | Admitting: Cardiology

## 2016-04-18 ENCOUNTER — Ambulatory Visit (INDEPENDENT_AMBULATORY_CARE_PROVIDER_SITE_OTHER): Payer: Medicare Other | Admitting: Cardiology

## 2016-04-18 VITALS — BP 132/86 | HR 74 | Ht 70.0 in | Wt 241.5 lb

## 2016-04-18 DIAGNOSIS — I10 Essential (primary) hypertension: Secondary | ICD-10-CM | POA: Diagnosis not present

## 2016-04-18 DIAGNOSIS — I5042 Chronic combined systolic (congestive) and diastolic (congestive) heart failure: Secondary | ICD-10-CM

## 2016-04-18 DIAGNOSIS — I2510 Atherosclerotic heart disease of native coronary artery without angina pectoris: Secondary | ICD-10-CM

## 2016-04-18 DIAGNOSIS — I63219 Cerebral infarction due to unspecified occlusion or stenosis of unspecified vertebral arteries: Secondary | ICD-10-CM

## 2016-04-18 DIAGNOSIS — Z794 Long term (current) use of insulin: Secondary | ICD-10-CM

## 2016-04-18 DIAGNOSIS — E1159 Type 2 diabetes mellitus with other circulatory complications: Secondary | ICD-10-CM

## 2016-04-18 DIAGNOSIS — E7800 Pure hypercholesterolemia, unspecified: Secondary | ICD-10-CM

## 2016-04-18 DIAGNOSIS — E785 Hyperlipidemia, unspecified: Secondary | ICD-10-CM | POA: Insufficient documentation

## 2016-04-18 MED ORDER — CARVEDILOL 25 MG PO TABS: 25.0000 mg | ORAL_TABLET | Freq: Two times a day (BID) | ORAL | 1 refills | Status: DC

## 2016-04-18 NOTE — Patient Instructions (Signed)
We will check fasting lab work  Increase Coreg to 25 mg twice a day  I will see you in 6 months.

## 2016-04-22 DIAGNOSIS — E1151 Type 2 diabetes mellitus with diabetic peripheral angiopathy without gangrene: Secondary | ICD-10-CM | POA: Diagnosis not present

## 2016-04-22 DIAGNOSIS — E784 Other hyperlipidemia: Secondary | ICD-10-CM | POA: Diagnosis not present

## 2016-04-23 ENCOUNTER — Encounter: Payer: Self-pay | Admitting: Cardiology

## 2016-04-25 ENCOUNTER — Other Ambulatory Visit: Payer: Self-pay

## 2016-04-25 ENCOUNTER — Encounter: Payer: Self-pay | Admitting: Cardiology

## 2016-04-25 MED ORDER — ATORVASTATIN CALCIUM 80 MG PO TABS: 80.0000 mg | ORAL_TABLET | Freq: Every day | ORAL | 6 refills | Status: DC

## 2016-05-15 ENCOUNTER — Telehealth: Payer: Self-pay

## 2016-05-15 NOTE — Telephone Encounter (Signed)
Clearance form fax to [facility] medical for colonoscopy at [phone].

## 2016-05-22 DIAGNOSIS — I2510 Atherosclerotic heart disease of native coronary artery without angina pectoris: Secondary | ICD-10-CM | POA: Diagnosis not present

## 2016-05-22 DIAGNOSIS — E1151 Type 2 diabetes mellitus with diabetic peripheral angiopathy without gangrene: Secondary | ICD-10-CM | POA: Diagnosis not present

## 2016-05-22 DIAGNOSIS — N401 Enlarged prostate with lower urinary tract symptoms: Secondary | ICD-10-CM | POA: Diagnosis not present

## 2016-05-22 DIAGNOSIS — M1990 Unspecified osteoarthritis, unspecified site: Secondary | ICD-10-CM | POA: Diagnosis not present

## 2016-05-22 DIAGNOSIS — Z23 Encounter for immunization: Secondary | ICD-10-CM | POA: Diagnosis not present

## 2016-05-22 DIAGNOSIS — Z6835 Body mass index (BMI) 35.0-35.9, adult: Secondary | ICD-10-CM | POA: Diagnosis not present

## 2016-05-22 DIAGNOSIS — K760 Fatty (change of) liver, not elsewhere classified: Secondary | ICD-10-CM | POA: Diagnosis not present

## 2016-05-22 DIAGNOSIS — I69959 Hemiplegia and hemiparesis following unspecified cerebrovascular disease affecting unspecified side: Secondary | ICD-10-CM | POA: Diagnosis not present

## 2016-05-22 DIAGNOSIS — E784 Other hyperlipidemia: Secondary | ICD-10-CM | POA: Diagnosis not present

## 2016-05-22 DIAGNOSIS — E669 Obesity, unspecified: Secondary | ICD-10-CM | POA: Diagnosis not present

## 2016-05-22 DIAGNOSIS — I69320 Aphasia following cerebral infarction: Secondary | ICD-10-CM | POA: Diagnosis not present

## 2016-05-22 DIAGNOSIS — I10 Essential (primary) hypertension: Secondary | ICD-10-CM | POA: Diagnosis not present

## 2016-06-03 NOTE — Telephone Encounter (Signed)
Can you please fax the clearance form to an updated fax # [phone].

## 2016-06-03 NOTE — Telephone Encounter (Signed)
Rn spoke with Joseph Art about clearance form was fax on 05/15/2016. Renee stated that they are apart of [facility] now and to fax it to [phone]. Form fax to new fax number twice and confirmed.

## 2016-06-04 ENCOUNTER — Encounter: Payer: Self-pay | Admitting: Neurology

## 2016-06-16 DIAGNOSIS — Z5181 Encounter for therapeutic drug level monitoring: Secondary | ICD-10-CM | POA: Diagnosis not present

## 2016-06-17 DIAGNOSIS — K621 Rectal polyp: Secondary | ICD-10-CM | POA: Diagnosis not present

## 2016-06-17 DIAGNOSIS — Z1211 Encounter for screening for malignant neoplasm of colon: Secondary | ICD-10-CM | POA: Diagnosis not present

## 2016-06-17 DIAGNOSIS — K648 Other hemorrhoids: Secondary | ICD-10-CM | POA: Diagnosis not present

## 2016-06-17 DIAGNOSIS — D128 Benign neoplasm of rectum: Secondary | ICD-10-CM | POA: Diagnosis not present

## 2016-06-17 DIAGNOSIS — Z86010 Personal history of colonic polyps: Secondary | ICD-10-CM | POA: Diagnosis not present

## 2016-06-17 DIAGNOSIS — K635 Polyp of colon: Secondary | ICD-10-CM | POA: Diagnosis not present

## 2016-06-17 DIAGNOSIS — D125 Benign neoplasm of sigmoid colon: Secondary | ICD-10-CM | POA: Diagnosis not present

## 2016-06-17 DIAGNOSIS — D123 Benign neoplasm of transverse colon: Secondary | ICD-10-CM | POA: Diagnosis not present

## 2016-06-30 ENCOUNTER — Ambulatory Visit: Payer: Medicare Other | Admitting: Nurse Practitioner

## 2016-07-14 ENCOUNTER — Ambulatory Visit (INDEPENDENT_AMBULATORY_CARE_PROVIDER_SITE_OTHER): Payer: Medicare Other | Admitting: Nurse Practitioner

## 2016-07-14 ENCOUNTER — Encounter: Payer: Self-pay | Admitting: Nurse Practitioner

## 2016-07-14 VITALS — BP 143/85 | HR 73 | Resp 16 | Ht 70.0 in | Wt 242.0 lb

## 2016-07-14 DIAGNOSIS — I10 Essential (primary) hypertension: Secondary | ICD-10-CM

## 2016-07-14 DIAGNOSIS — E7800 Pure hypercholesterolemia, unspecified: Secondary | ICD-10-CM | POA: Diagnosis not present

## 2016-07-14 DIAGNOSIS — I63219 Cerebral infarction due to unspecified occlusion or stenosis of unspecified vertebral arteries: Secondary | ICD-10-CM | POA: Diagnosis not present

## 2016-07-14 NOTE — Patient Instructions (Signed)
Continue control of risk factors to prevent further stroke Continue Plavix for secondary stroke prevention Blood pressure goal below 130/90 todays reading 143/85 continue blood pressure medications Diabetes with hemoglobin A1c below 6.5, continue Levemir LDL cholesterol goal below 70, continue Lipitor Continue exercise okay to try golf Follow-up in 6 months

## 2016-07-14 NOTE — Progress Notes (Signed)
[facility]  PATIENT: Jose Beck DOB: 06-07-1945   REASON FOR VISIT: Follow-up for history of stroke HISTORY FROM: Patient and wife    HISTORY OF PRESENT ILLNESS:HISTORY  Jose Beck is a 58 year Caucasian male who is accompanied today by his wife. History is provided by the patient, wife as well as review of available medical records from [facility] in [place]. Patient was visiting [place] in [place] on 08/06/15 while at the visit presented as washing his hands noticed that all of a sudden the sound of the water was too loud. He also noticed that he was dizzy, off balance and had some slurred speech. EMS was called and patient was taken to the hospital. Patient was in the hospital for a few days. I do not have available detailed discharge summary and stroke workup details.. The only records provided to me included a report of a CT scan of the head on that day which showed no acute abnormality and remote age lacunar infarcts. 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 The mild atheromatous changes at carotid bifurcations as well as in the intracranial vessels. Patient was started on aspirin and Plavix which is tolerating well his had some bruising episodes particularly when he fell once. He is currently in outpatient physical and occupational therapy. He is been able to walk with a walker. He states his hearing and speech have improved though occasionally when he is tired he slows on few words. He still has imbalance but is careful and as long as he uses a walker is safe. He has no known prior history of strokes or TIAs. He does have multiple vascular risk factors in the form of diabetes, hypertension, hyperlipidemia, heart disease and obesity. Is not been diagnosed with sleep apnea but does he does snore and does have daytime sleepiness and  tiredness. The patient's wife did inform me that she has more detailed hospital records and she will drop them off for my review later. Update 12/26/2015 :   He returns for follow-up after last visit 3 months ago. Is accompanied by his wife. Patient continues to do well. He has discontinued aspirin and is on Plavix alone tolerating well without bleeding or bruising. He has brought his blood pressure log along which looks pretty well controlled though today it is slightly elevated at 152/70. Is tolerating Lipitor 40 mg well without any side effects. Lipid profile last checked on 11/06/15 shows total cholesterol of 147 but HDL is yet slightly elevated at 189 and HDL is 40 mg percent. Hemoglobin A1c 6.2. TSH 1.2. Patient had cardiac cath done on 11/16/15 by Jose Beck which showed severe multivessel disease with chronic occlusions. He was recommended medical therapy. Patient states he's been eating healthy. His gradually lost 20 pounds over the last 6 months. He is also active and uses an exercise bike at home for 30 minutes. He has finished outpatient physical and occupation therapy. Still feels she has fine coordination in his right arm in slight gait imbalance. He wants to drive but wants me to refer him for a comprehensive evaluation at [facility] in [place] . He has not  had a sleep study yet unclear reasons. UPDATE 03/05/2018[name], 71 year old male returns for follow-up with a history of stroke event 3 2017. He is currently on Plavix for secondary stroke prevention without further stroke or TIA symptoms  he has no bruising or bleeding. Blood pressure in the office today 143/85. He is diabetic and is on Levemir with better control of his diabetes he claims. He is also on Lipitor for hyperlipidemia labs are from Jose Beck. His sleep study did not reveal obstructive sleep apnea rather periodic limb movements. They are not bothersome to him and he does not wish to be on any  medication. He is exercising by using a recumbent bike. He wants to get back into golf. He continues to have some mild slurring of speech. If he is particularly fatigued. He returns for reevaluation  REVIEW OF SYSTEMS: Full 14 system review of systems performed and notable only for those listed, all others are neg:  Constitutional: neg  Cardiovascular: neg Ear/Nose/Throat: neg  Skin: neg Eyes: neg Respiratory: neg Gastroitestinal: neg  Hematology/Lymphatic: neg  Endocrine: neg Musculoskeletal:neg Allergy/Immunology: neg Neurological: Speech difficulty at times Psychiatric: neg Sleep : neg   ALLERGIES: Allergies  Allergen Reactions  . Cheese Nausea And Vomiting  . Pravastatin     Anxiety attack    HOME MEDICATIONS: Outpatient Medications Prior to Visit  Medication Sig Dispense Refill  . atorvastatin (LIPITOR) 80 MG tablet Take 1 tablet (80 mg total) by mouth daily at 6 PM. 30 tablet 6  . clopidogrel (PLAVIX) 75 MG tablet Take 1 tablet (75 mg total) by mouth daily. 30 tablet 1  . insulin detemir (LEVEMIR) 100 UNIT/ML injection Inject 0.22 mLs (22 Units total) into the skin 2 (two) times daily. 10 mL 11  . LEVEMIR FLEXTOUCH 100 UNIT/ML Pen     . losartan (COZAAR) 50 MG tablet Take 1 tablet (50 mg total) by mouth daily. 90 tablet 2  . nitroGLYCERIN (NITROSTAT) 0.4 MG SL tablet Place 0.4 mg under the tongue every 5 (five) minutes as needed for chest pain (x 3 doses).    . carvedilol (COREG) 25 MG tablet Take 1 tablet (25 mg total) by mouth 2 (two) times daily. 60 tablet 1   No facility-administered medications prior to visit.     PAST MEDICAL HISTORY: Past Medical History:  Diagnosis Date  . Aortic stenosis    a. 11/2015 Echo: EF 55-60%, Gr1 DD, mild AS.  Marland Kitchen Chronic combined systolic and diastolic CHF (congestive heart failure) ([place])    a. 07/2015: TEE showing EF of 40% b. 11/2015: Echo w/ EF 55-60%, Grade 1 DD, mild AS.  Marland Kitchen Coronary artery disease    a. patient reports 100%  stenosis of LAD and RCA by cath in 1999. b. 11/2015 Cath: LM nl, LAD 100 - fills by L->L collats from D1, LCX 60d, OM1 lalrge/nl, OM2 small/nl, RCA 190m, L->R collats.EF 35-45% (55-60% by echo).  . Diabetes mellitus without complication ([place])   . GERD (gastroesophageal reflux disease)   . Hyperlipemia   . Hypertension   . Hypertensive heart disease   . Stroke [facility])    a. 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 Thrombocytopenia ([place]) 11/2015    PAST SURGICAL HISTORY: Past Surgical History:  Procedure Laterality Date  . CARDIAC CATHETERIZATION    . CARDIAC CATHETERIZATION N/A 11/16/2015   Procedure: Left Heart Cath and Coronary Angiography;  Surgeon: [name], MD;  Location: [place] [facility];  Service: Cardiovascular;  Laterality: N/A;  . MOLE REMOVAL    . TONSILLECTOMY      FAMILY HISTORY: Family History  Problem Relation Age of Onset  . Hypertension Mother   . Heart disease Mother  SOCIAL HISTORY: Social History   Social History  . Marital status: Married    Spouse name: N/A  . Number of children: 0  . Years of education: College   Occupational History  . Retired     Social History Main Topics  . Smoking status: Never Smoker  . Smokeless tobacco: Never Used  . Alcohol use 0.6 oz/week    1 Shots of liquor per week     Comment: cocktails every day  . Drug use: No  . Sexual activity: Not on file   Other Topics Concern  . Not on file   Social History Narrative   Drinks coffee daily      PHYSICAL EXAM  Vitals:   07/14/16 1514  BP: (!) 143/85  Pulse: 73  Resp: 16  Weight: 242 lb (109.8 kg)  Height: 5\' 10"  (1.778 m)   Body mass index is 34.72 kg/m.  Generalized: Well developed, Obese male in no acute distress  Head: normocephalic and atraumatic,. Oropharynx benign  Neck: Supple, no carotid bruits  Cardiac: Regular rate rhythm, no murmur  Musculoskeletal: No deformity   Neurological examination   Mentation: Alert oriented to time,  place, history taking. Attention span and concentration appropriate. Recent and remote memory intact.  Follows all commands speech and language fluent.   Cranial nerve II-XII: .Pupils were equal round reactive to light extraocular movements were full, visual field were full on confrontational test. Facial sensation and strength were normal. hearing was intact to finger rubbing bilaterally. Uvula tongue midline. head turning and shoulder shrug were normal and symmetric.Tongue protrusion into cheek strength was normal. Motor: normal bulk and tone, full strength in the BUE, BLE, fine finger movements normal, no pronator drift. No focal weakness Sensory: normal and symmetric to light touch, pinprick, and  Vibration, in the upper and lower extremities Coordination: finger-nose-finger, heel-to-shin bilaterally, no dysmetria, no tremor Reflexes: 1+ upper lower and symmetric plantar responses were flexor bilaterally. Gait and Station: Rising up from seated position without assistance, normal stance,  moderate stride, good arm swing, slight imbalance when turning  able to perform tiptoe, and heel walking without difficulty. Tandem gait is unsteady  DIAGNOSTIC DATA (LABS, IMAGING, TESTING) - I reviewed patient records, labs, notes, testing and imaging myself where available.  Lab Results  Component Value Date   WBC 4.4 11/16/2015   HGB 14.8 11/16/2015   HCT 42.1 11/16/2015   MCV 94.0 11/16/2015   PLT 107 (L) 11/16/2015      Component Value Date/Time   NA 133 (L) 11/16/2015 0445   K 3.7 11/16/2015 0445   CL 101 11/16/2015 0445   CO2 21 (L) 11/16/2015 0445   GLUCOSE 136 (H) 11/16/2015 0445   BUN 13 11/16/2015 0445   CREATININE 0.65 11/16/2015 1723   CALCIUM 8.9 11/16/2015 0445   PROT 7.8 08/10/2015 0530   ALBUMIN 3.6 08/10/2015 0530   AST 59 (H) 08/10/2015 0530   ALT 60 08/10/2015 0530   ALKPHOS 44 08/10/2015 0530   BILITOT 1.3 (H) 08/10/2015 0530   GFRNONAA >60 11/16/2015 1723   GFRAA >60  11/16/2015 1723   Lab Results  Component Value Date   CHOL 151 11/16/2015   HDL 46 11/16/2015   LDLCALC 94 11/16/2015   TRIG 57 11/16/2015   CHOLHDL 3.3 11/16/2015   Lab Results  Component Value Date   HGBA1C 6.2 (H) 11/16/2015    ASSESSMENT AND PLAN  71 y.o. 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 Vascular risk factors of hypertension, hyperlipidemia, obesity and intracranial atherosclerosis. Sleep study was negative for obstructive sleep apnea The patient is a current patient of JoseSethi  who is out of the office today . This note is sent to the work in doctor.     PLAN: Continue control of risk factors to prevent further stroke Continue Plavix for secondary stroke prevention Blood pressure goal below 130/90 todays reading 143/85 continue blood pressure medications Diabetes with hemoglobin A1c below 6.5, continue Levemir LDL cholesterol goal below 70, continue Lipitor Continue exercise okay to try golf Follow-up in 6 months I spent 25 minutes in total face to face time with the patient more than 50% of which was spent counseling and coordination of care, reviewing test results reviewing medications and discussing and reviewing the diagnosis of stroke and importance of management of risk factors Dennie Bible, [facility], [facility], APRN  [facility] [address] [place], [place] [address] [phone]

## 2016-07-20 NOTE — Progress Notes (Signed)
Personally  participated in and made any corrections needed to history, physical, neuro exam,assessment and plan as stated above.     Sarina Ill, MD [facility]

## 2016-08-28 IMAGING — CR DG CHEST 2V
2 series · 2 of 2 positions shown · non-contrast
Comparison: Chest radiograph performed 06/28/2005

CLINICAL DATA: Acute onset of generalized chest pain. Initial
encounter.

EXAM:
CHEST  2 VIEW

[chest pa]
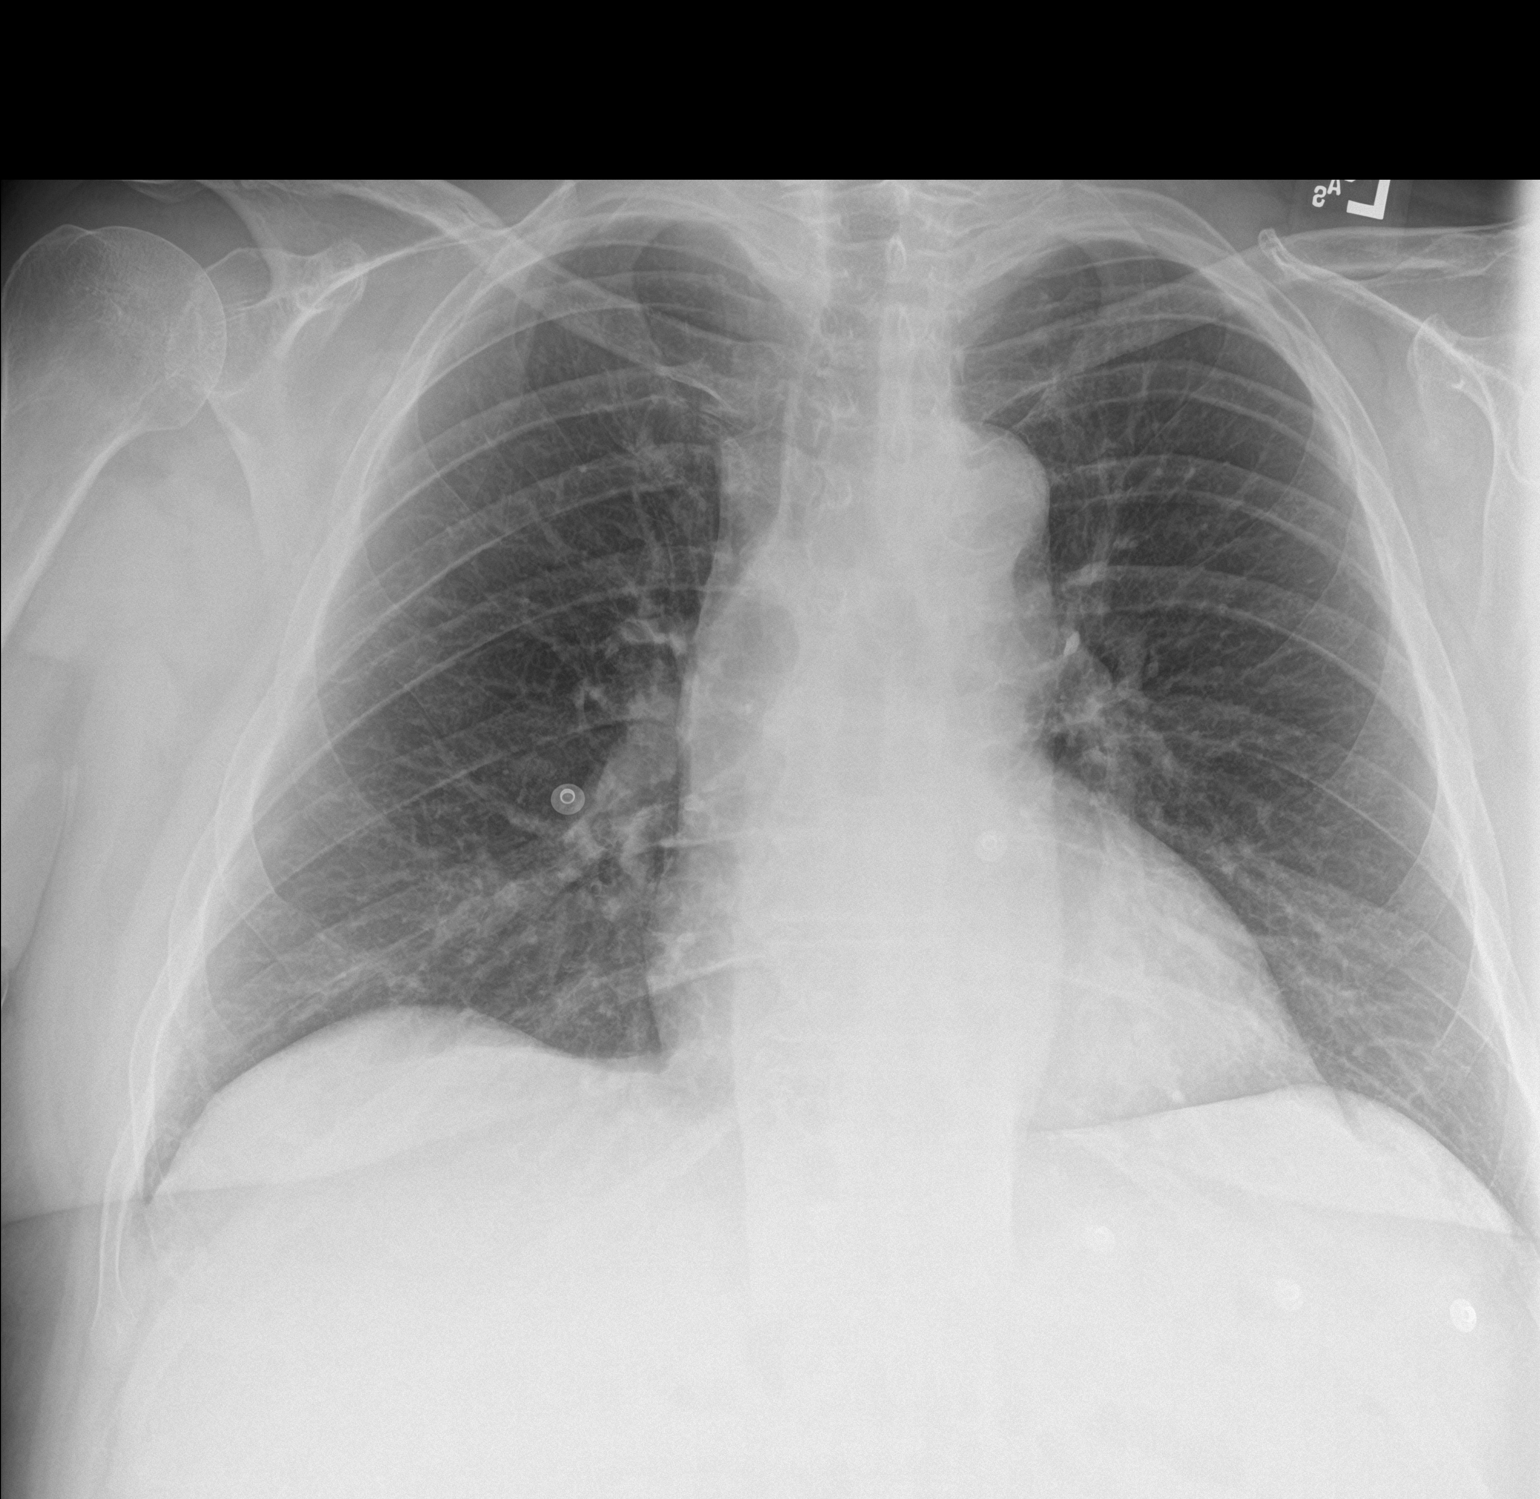

[chest lat]
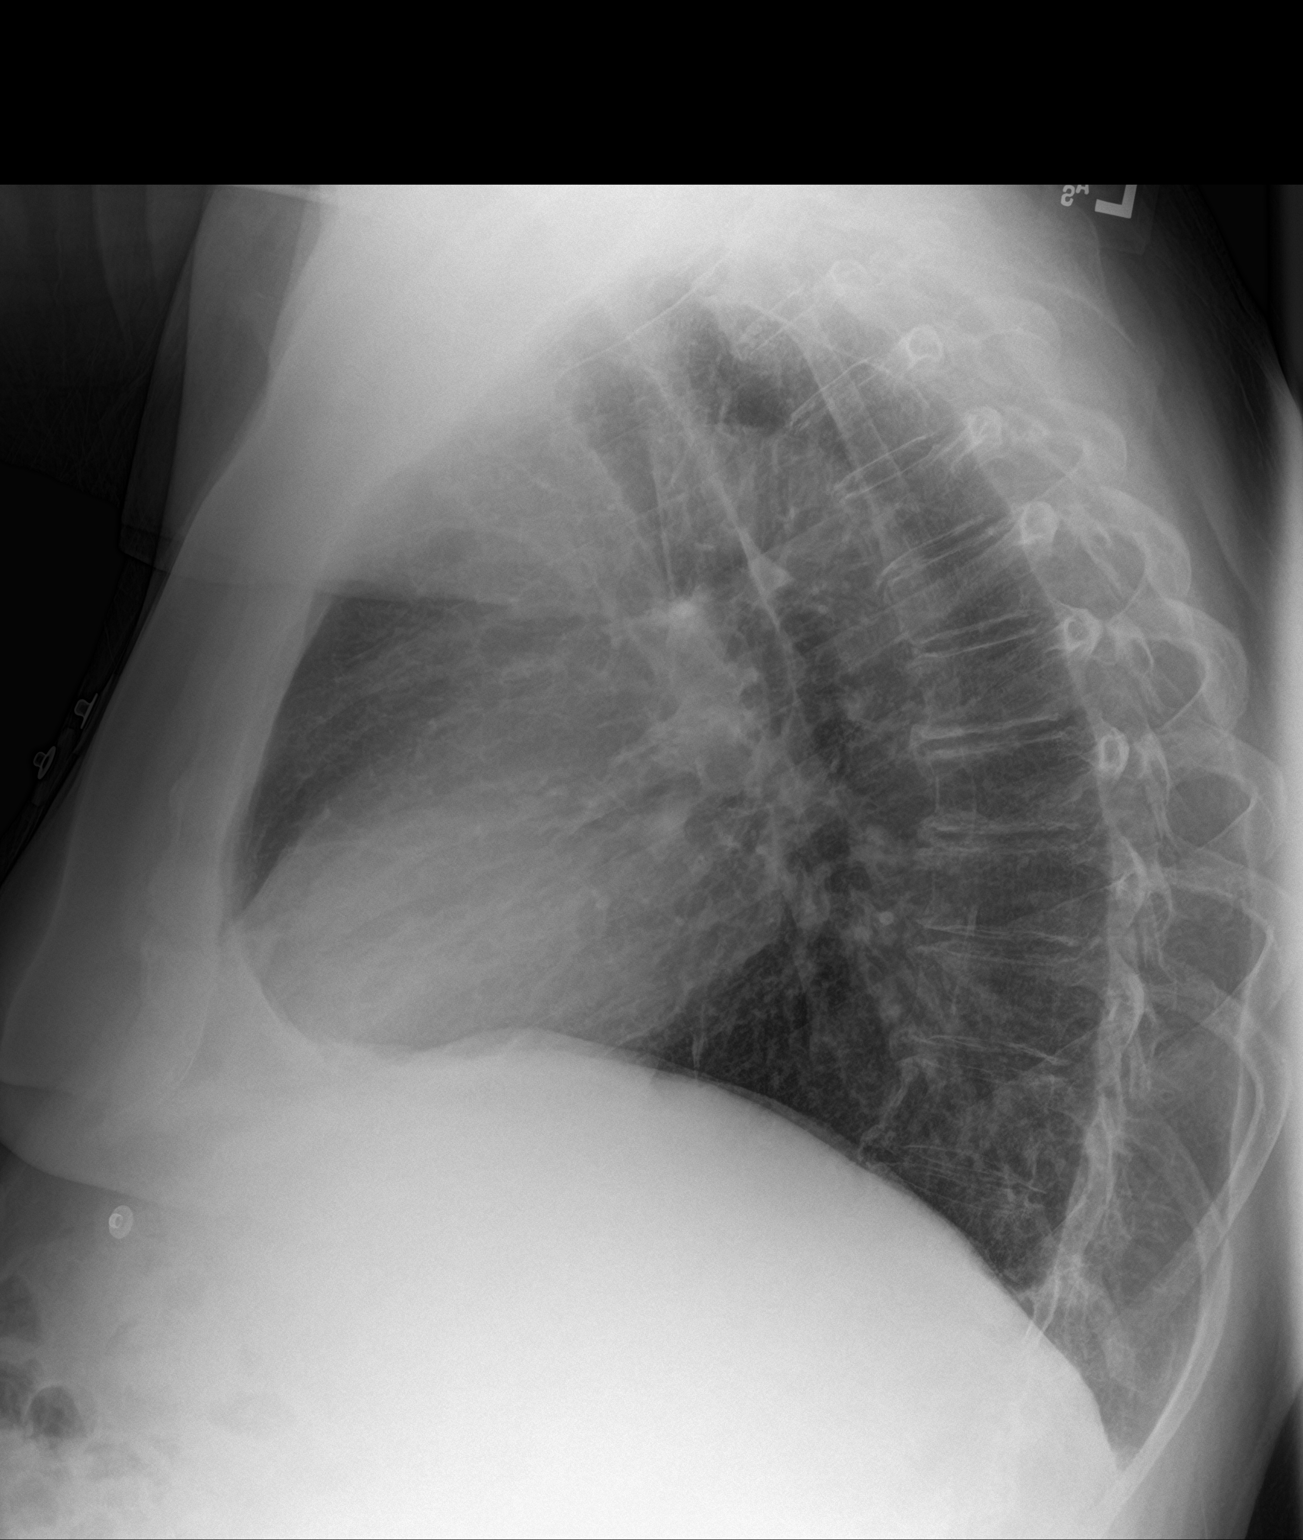

[2 of 2 positions shown; findings below may reference images not displayed]

FINDINGS: The lungs are well-aerated and clear. There is no evidence of focal
opacification, pleural effusion or pneumothorax.

The heart is normal in size; the mediastinal contour is within
normal limits. No acute osseous abnormalities are seen.
IMPRESSION: No acute cardiopulmonary process seen.

## 2016-09-25 DIAGNOSIS — I2510 Atherosclerotic heart disease of native coronary artery without angina pectoris: Secondary | ICD-10-CM | POA: Diagnosis not present

## 2016-09-25 DIAGNOSIS — I69320 Aphasia following cerebral infarction: Secondary | ICD-10-CM | POA: Diagnosis not present

## 2016-09-25 DIAGNOSIS — I69959 Hemiplegia and hemiparesis following unspecified cerebrovascular disease affecting unspecified side: Secondary | ICD-10-CM | POA: Diagnosis not present

## 2016-09-25 DIAGNOSIS — N401 Enlarged prostate with lower urinary tract symptoms: Secondary | ICD-10-CM | POA: Diagnosis not present

## 2016-09-25 DIAGNOSIS — E668 Other obesity: Secondary | ICD-10-CM | POA: Diagnosis not present

## 2016-09-25 DIAGNOSIS — Z6834 Body mass index (BMI) 34.0-34.9, adult: Secondary | ICD-10-CM | POA: Diagnosis not present

## 2016-09-25 DIAGNOSIS — E1151 Type 2 diabetes mellitus with diabetic peripheral angiopathy without gangrene: Secondary | ICD-10-CM | POA: Diagnosis not present

## 2016-09-25 DIAGNOSIS — K760 Fatty (change of) liver, not elsewhere classified: Secondary | ICD-10-CM | POA: Diagnosis not present

## 2016-09-25 DIAGNOSIS — M1990 Unspecified osteoarthritis, unspecified site: Secondary | ICD-10-CM | POA: Diagnosis not present

## 2016-09-25 DIAGNOSIS — I10 Essential (primary) hypertension: Secondary | ICD-10-CM | POA: Diagnosis not present

## 2016-09-25 DIAGNOSIS — E784 Other hyperlipidemia: Secondary | ICD-10-CM | POA: Diagnosis not present

## 2016-10-13 NOTE — Progress Notes (Signed)
Office Visit    Patient Name: Jose Beck Date of Encounter: 10/15/2016  Primary Care Provider:  [name], MD Primary Cardiologist:  P. Martinique, MD   Chief Complaint    71 year old male with a prior history of CAD, hypertension, hyperlipidemia, and stroke who presents for follow up CAD.  Past Medical History    Past Medical History:  Diagnosis Date  . Aortic stenosis    a. 11/2015 Echo: EF 55-60%, Gr1 DD, mild AS.  Marland Kitchen Chronic combined systolic and diastolic CHF (congestive heart failure) ([place])    a. 07/2015: TEE showing EF of 40% b. 11/2015: Echo w/ EF 55-60%, Grade 1 DD, mild AS.  Marland Kitchen Coronary artery disease    a. patient reports 100% stenosis of LAD and RCA by cath in 1999. b. 11/2015 Cath: LM nl, LAD 100 - fills by L->L collats from D1, LCX 60d, OM1 lalrge/nl, OM2 small/nl, RCA 156m, L->R collats.EF 35-45% (55-60% by echo).  . Diabetes mellitus without complication ([place])   . GERD (gastroesophageal reflux disease)   . Hyperlipemia   . Hypertension   . Hypertensive heart disease   . Stroke [facility])    a. 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 Thrombocytopenia ([place]) 11/2015   Past Surgical History:  Procedure Laterality Date  . CARDIAC CATHETERIZATION    . CARDIAC CATHETERIZATION N/A 11/16/2015   Procedure: Left Heart Cath and Coronary Angiography;  Surgeon: [name], MD;  Location: [place];  Service: Cardiovascular;  Laterality: N/A;  . MOLE REMOVAL    . TONSILLECTOMY      Allergies  Allergies  Allergen Reactions  . Cheese Nausea And Vomiting  . Pravastatin     Anxiety attack    History of Present Illness    71 year old male with prior history of CAD and known occlusive disease involving the right coronary artery and LAD with collateral circulation. This dates to 3 when he had a cardiac cath.  He has been medically managed over the years. He also has a history of hypertension, hyperlipidemia, obesity, and suffered a cerebellar  stroke in March 2017.   He was admitted to [facility] in July 2017 with chest pain and ruled out. Cardiac cath revealed occluded LAD with left to left collaterals and also an occluded right coronary artery with left-to-right collaterals. EF was depressed on left ventriculogram 40%. These findings were consistent with remote cath report. Echo was read as normal EF. On my personal review his Echo was not normal but demostrated anteroseptal and apical HK, inferoapical AK and EF 40-45%. Medical therapy was recommended and his regimen was changed to include carvedilol and losartan.   On follow up today he is doing well. He has some residual dysarthria.  He exercises 5 days a week on a recumbent bike for 55 minutes. He denies any SOB, CP, dizziness, palpitations, or edema. He has lost 4 lbs since his last visit.   Home Medications    Allergies as of 10/15/2016      Reactions   Cheese Nausea And Vomiting   Pravastatin    Anxiety attack      Medication List       Accurate as of 10/15/16  9:24 AM. Always use your most recent med list.          atorvastatin 80 MG tablet Commonly known as:  LIPITOR Take 1 tablet (80 mg total) by mouth daily at 6 PM.   carvedilol 25 MG tablet Commonly known  as:  COREG Take 1 tablet (25 mg total) by mouth 2 (two) times daily.   clopidogrel 75 MG tablet Commonly known as:  PLAVIX Take 1 tablet (75 mg total) by mouth daily.   insulin detemir 100 UNIT/ML injection Commonly known as:  LEVEMIR Inject 0.22 mLs (22 Units total) into the skin 2 (two) times daily.   LEVEMIR FLEXTOUCH 100 UNIT/ML Pen Generic drug:  Insulin Detemir   losartan 50 MG tablet Commonly known as:  COZAAR Take 1 tablet (50 mg total) by mouth daily.   nitroGLYCERIN 0.4 MG SL tablet Commonly known as:  NITROSTAT Place 0.4 mg under the tongue every 5 (five) minutes as needed for chest pain (x 3 doses).        Review of Systems    As noted in HPI. All other systems reviewed and are  otherwise negative except as noted above.  Physical Exam    VS:  BP (!) 142/78   Pulse 62   Ht 5\' 10"  (1.778 m)   Wt 238 lb 12.8 oz (108.3 kg)   BMI 34.26 kg/m  , BMI Body mass index is 34.26 kg/m. GEN: Well nourished, obese, in no acute distress.  HEENT: Expressive aphasia. Neck: Supple, no JVD, carotid bruits, or masses. Cardiac: RRR, 2/6 systolic ejection murmur at the bilateral upper sternal borders, no rubs, or gallops. No clubbing, cyanosis, edema.  Radials/DP/PT 2+ and equal bilaterally.  Respiratory:  Respirations regular and unlabored, clear to auscultation bilaterally. GI: Soft, nontender, nondistended, BS + x 4. MS: no deformity or atrophy. Skin: warm and dry, no rash. Neuro:  Strength and sensation are intact. Psych: Normal affect.  Accessory Clinical Findings    Lab Results  Component Value Date   WBC 4.4 11/16/2015   HGB 14.8 11/16/2015   HCT 42.1 11/16/2015   PLT 107 (L) 11/16/2015   GLUCOSE 136 (H) 11/16/2015   CHOL 151 11/16/2015   TRIG 57 11/16/2015   HDL 46 11/16/2015   LDLCALC 94 11/16/2015   ALT 60 08/10/2015   AST 59 (H) 08/10/2015   NA 133 (L) 11/16/2015   K 3.7 11/16/2015   CL 101 11/16/2015   CREATININE 0.65 11/16/2015   BUN 13 11/16/2015   CO2 21 (L) 11/16/2015   INR 1.13 11/16/2015   HGBA1C 6.2 (H) 11/16/2015   Labs dated 04/22/16: cholesterol 147, triglycerides 54, HDL 63, LDL 73. CMET normal Dated 09/25/16 A1c 5.4%.  Ecg today shows NSR with old septal infarct. No change from July 2017. I have personally reviewed and interpreted this study.   Assessment & Plan    1.  Coronary artery disease with stable angina: Chronic occlusion of LAD and RCA since 1992. Collaterals from a large first diagonal and OM.  He has been medically managed with beta blocker, Plavix, ARB, and statin therapy. He is asymptomatic.  Continue medical therapy.  2. Hypertensive heart disease:  Continue Coreg and losartan.  3. Hyperlipidemia: He is on Lipitor with  good response to increased dose with LDL decreased from 94>>73.   4. Aortic stenosis: Mild   5. S/p cerebellar stroke:  On Plavix and statin therapy.  6. Chronic systolic CHF. EF 40-45%.   Follow up in 6 months.   Sharry Beining Martinique, MD,FACC 10/15/2016, 9:24 AM

## 2016-10-15 ENCOUNTER — Ambulatory Visit (INDEPENDENT_AMBULATORY_CARE_PROVIDER_SITE_OTHER): Payer: Medicare Other | Admitting: Cardiology

## 2016-10-15 VITALS — BP 142/78 | HR 62 | Ht 70.0 in | Wt 238.8 lb

## 2016-10-15 DIAGNOSIS — I63219 Cerebral infarction due to unspecified occlusion or stenosis of unspecified vertebral arteries: Secondary | ICD-10-CM

## 2016-10-15 DIAGNOSIS — I10 Essential (primary) hypertension: Secondary | ICD-10-CM | POA: Diagnosis not present

## 2016-10-15 DIAGNOSIS — I2510 Atherosclerotic heart disease of native coronary artery without angina pectoris: Secondary | ICD-10-CM | POA: Diagnosis not present

## 2016-10-15 DIAGNOSIS — E7800 Pure hypercholesterolemia, unspecified: Secondary | ICD-10-CM | POA: Diagnosis not present

## 2016-10-15 DIAGNOSIS — I5042 Chronic combined systolic (congestive) and diastolic (congestive) heart failure: Secondary | ICD-10-CM

## 2016-10-15 NOTE — Patient Instructions (Signed)
Continue your current therapy  I will see you in 6 months.   

## 2016-12-02 DIAGNOSIS — L570 Actinic keratosis: Secondary | ICD-10-CM | POA: Diagnosis not present

## 2016-12-02 DIAGNOSIS — Z86018 Personal history of other benign neoplasm: Secondary | ICD-10-CM | POA: Diagnosis not present

## 2016-12-02 DIAGNOSIS — D2222 Melanocytic nevi of left ear and external auricular canal: Secondary | ICD-10-CM | POA: Diagnosis not present

## 2016-12-02 DIAGNOSIS — L814 Other melanin hyperpigmentation: Secondary | ICD-10-CM | POA: Diagnosis not present

## 2016-12-02 DIAGNOSIS — L821 Other seborrheic keratosis: Secondary | ICD-10-CM | POA: Diagnosis not present

## 2016-12-02 DIAGNOSIS — D225 Melanocytic nevi of trunk: Secondary | ICD-10-CM | POA: Diagnosis not present

## 2016-12-02 DIAGNOSIS — D1801 Hemangioma of skin and subcutaneous tissue: Secondary | ICD-10-CM | POA: Diagnosis not present

## 2016-12-02 DIAGNOSIS — Z85828 Personal history of other malignant neoplasm of skin: Secondary | ICD-10-CM | POA: Diagnosis not present

## 2017-01-14 NOTE — Progress Notes (Signed)
[facility]  PATIENT: Jose Beck DOB: [birth year]   REASON FOR VISIT: Follow-up for history of stroke HISTORY FROM: Patient and wife    HISTORY OF PRESENT ILLNESS:HISTORY  Jose Beck is a 72 year Caucasian male who is accompanied today by his wife. History is provided by the patient, wife as well as review of available medical records from [facility] in [place]. Patient was visiting Center in [place] on 08/06/15 while at the visit presented as washing his hands noticed that all of a sudden the sound of the water was too loud. He also noticed that he was dizzy, off balance and had some slurred speech. EMS was called and patient was taken to the hospital. Patient was in the hospital for a few days. I do not have available detailed discharge summary and stroke workup details.. The only records provided to me included a report of a CT scan of the head on that day which showed no acute abnormality and remote age lacunar infarcts. 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 The mild atheromatous changes at carotid bifurcations as well as in the intracranial vessels. Patient was started on aspirin and Plavix which is tolerating well his had some bruising episodes particularly when he fell once. He is currently in outpatient physical and occupational therapy. He is been able to walk with a walker. He states his hearing and speech have improved though occasionally when he is tired he slows on few words. He still has imbalance but is careful and as long as he uses a walker is safe. He has no known prior history of strokes or TIAs. He does have multiple vascular risk factors in the form of diabetes, hypertension, hyperlipidemia, heart disease and obesity. Is not been diagnosed with sleep apnea but does he does snore and does have daytime sleepiness and  tiredness. The patient's wife did inform me that she has more detailed hospital records and she will drop them off for my review later. Update 8/16/2017PS :   He returns for follow-up after last visit 3 months ago. Is accompanied by his wife. Patient continues to do well. He has discontinued aspirin and is on Plavix alone tolerating well without bleeding or bruising. He has brought his blood pressure log along which looks pretty well controlled though today it is slightly elevated at 152/70. Is tolerating Lipitor 40 mg well without any side effects. Lipid profile last checked on 11/06/15 shows total cholesterol of 147 but HDL is yet slightly elevated at 189 and HDL is 40 mg percent. Hemoglobin A1c 6.2. TSH 1.2. Patient had cardiac cath done on 11/16/15 by Dr. Daneen Schick which showed severe multivessel disease with chronic occlusions. He was recommended medical therapy. Patient states he's been eating healthy. His gradually lost 20 pounds over the last 6 months. He is also active and uses an exercise bike at home for 30 minutes. He has finished outpatient physical and occupation therapy. Still feels she has fine coordination in his right arm in slight gait imbalance. He wants to drive but wants me to refer him for a comprehensive evaluation at [facility] rehabilitation [facility] in [place] . He has not  had a sleep study yet unclear reasons. UPDATE 03/05/2018[name], 71 year old male returns for follow-up with a history of stroke event 3 2017. He is currently on Plavix for secondary stroke prevention without further stroke or TIA symptoms  he has no bruising or bleeding. Blood pressure in the office today 143/85. He is diabetic and is on Levemir with better control of his diabetes he claims. He is also on Lipitor for hyperlipidemia labs are from Dr. [name]. His sleep study did not reveal obstructive sleep apnea rather periodic limb movements. They are not bothersome to him and he does not wish to be on any  medication. He is exercising by using a recumbent bike. He wants to get back into golf. He continues to have some mild slurring of speech. If he is particularly fatigued. He returns for reevaluation UPDATE 09/06/2018[name], 71 year old male returns for follow-up history of stroke in March 2017. He continues to do well and is currently on Plavix for secondary stroke prevention without further stroke or TIA symptoms. He has no bleeding and no bruising. Blood pressure in the office today 142/80. He claims his blood sugars run between 80-115. He has stopped playing golf but he continues to use his recumbent bike. He remains on Lipitor without myalgias. No falls occasionally uses a cane in unfamiliar places. He continues to have some mild slurring of speech particularly if he is fatigued. He returns for reevaluation REVIEW OF SYSTEMS: Full 14 system review of systems performed and notable only for those listed, all others are neg:  Constitutional: neg  Cardiovascular: neg Ear/Nose/Throat: neg  Skin: neg Eyes: neg Respiratory: neg Gastroitestinal: neg  Hematology/Lymphatic: neg  Endocrine: neg Musculoskeletal:neg Allergy/Immunology: neg Neurological: Speech difficulty at times Psychiatric: neg Sleep : neg   ALLERGIES: Allergies  Allergen Reactions  . Cheese Nausea And Vomiting  . Pravastatin     Anxiety attack    HOME MEDICATIONS: Outpatient Medications Prior to Visit  Medication Sig Dispense Refill  . atorvastatin (LIPITOR) 80 MG tablet Take 1 tablet (80 mg total) by mouth daily at 6 PM. 30 tablet 6  . clopidogrel (PLAVIX) 75 MG tablet Take 1 tablet (75 mg total) by mouth daily. 30 tablet 1  . insulin detemir (LEVEMIR) 100 UNIT/ML injection Inject 0.22 mLs (22 Units total) into the skin 2 (two) times daily. 10 mL 11  . LEVEMIR FLEXTOUCH 100 UNIT/ML Pen     . losartan (COZAAR) 50 MG tablet Take 1 tablet (50 mg total) by mouth daily. 90 tablet 2  . nitroGLYCERIN (NITROSTAT) 0.4 MG  SL tablet Place 0.4 mg under the tongue every 5 (five) minutes as needed for chest pain (x 3 doses).    . carvedilol (COREG) 25 MG tablet Take 1 tablet (25 mg total) by mouth 2 (two) times daily. 60 tablet 1   No facility-administered medications prior to visit.     PAST MEDICAL HISTORY: Past Medical History:  Diagnosis Date  . Aortic stenosis    a. 11/2015 Echo: EF 55-60%, Gr1 DD, mild AS.  Marland Kitchen Chronic combined systolic and diastolic CHF (congestive heart failure) ([place])    a. 07/2015: TEE showing EF of 40% b. 11/2015: Echo w/ EF 55-60%, Grade 1 DD, mild AS.  Marland Kitchen Coronary artery disease    a. patient reports 100% stenosis of LAD and RCA by cath in 1999. b. 11/2015 Cath: LM nl, LAD 100 - fills by L->L collats from D1, LCX 60d, OM1 lalrge/nl, OM2 small/nl, RCA 133m, L->R collats.EF 35-45% (55-60% by echo).  . Diabetes mellitus without complication ([place])   . GERD (gastroesophageal reflux disease)   . Hyperlipemia   . Hypertension   . Hypertensive heart disease   . Stroke [facility])    a.  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 Thrombocytopenia ([place]) 11/2015    PAST SURGICAL HISTORY: Past Surgical History:  Procedure Laterality Date  . CARDIAC CATHETERIZATION    . CARDIAC CATHETERIZATION N/A 11/16/2015   Procedure: Left Heart Cath and Coronary Angiography;  Surgeon: [name], MD;  Location: [facility];  Service: Cardiovascular;  Laterality: N/A;  . MOLE REMOVAL    . TONSILLECTOMY      FAMILY HISTORY: Family History  Problem Relation Age of Onset  . Hypertension Mother   . Heart disease Mother     SOCIAL HISTORY: Social History   Social History  . Marital status: Married    Spouse name: [name]  . Number of children: 0  . Years of education: College   Occupational History  . Retired     Social History Main Topics  . Smoking status: Never Smoker  . Smokeless tobacco: Never Used  . Alcohol use 0.6 oz/week    1 Shots of liquor per week     Comment: cocktails every  day  . Drug use: No  . Sexual activity: Not on file   Other Topics Concern  . Not on file   Social History Narrative   Drinks coffee daily      PHYSICAL EXAM  Vitals:   01/15/17 1354  BP: (!) 142/80  Pulse: 69  Weight: 242 lb 6.4 oz (110 kg)   Body mass index is 34.78 kg/m.  Generalized: Well developed, Obese male in no acute distress  Head: normocephalic and atraumatic,. Oropharynx benign  Neck: Supple, no carotid bruits  Cardiac: Regular rate rhythm, no murmur  Musculoskeletal: No deformity   Neurological examination   Mentation: Alert oriented to time, place, history taking. Attention span and concentration appropriate. Recent and remote memory intact.  Follows all commands speech Mildly dysarthric .   Cranial nerve II-XII: .Pupils were equal round reactive to light extraocular movements were full, visual field were full on confrontational test. Facial sensation and strength were normal. hearing was intact to finger rubbing bilaterally. Uvula tongue midline. head turning and shoulder shrug were normal and symmetric.Tongue protrusion into cheek strength was normal. Motor: normal bulk and tone, full strength in the BUE, BLE, fine finger movements normal, no pronator drift. No focal weakness Sensory: normal and symmetric to light touch, pinprick, and  Vibration, in the upper and lower extremities Coordination: finger-nose-finger, heel-to-shin bilaterally, no dysmetria, no tremor Reflexes: 1+ upper lower and symmetric plantar responses were flexor bilaterally. Gait and Station: Rising up from seated position without assistance, normal stance,  moderate stride, good arm swing, slight imbalance when turning  able to perform tiptoe, and heel walking without difficulty. Tandem gait is unsteady  DIAGNOSTIC DATA (LABS, IMAGING, TESTING) - I reviewed patient records, labs, notes, testing and imaging myself where available.  Lab Results  Component Value Date   WBC 4.4 11/16/2015     HGB 14.8 11/16/2015   HCT 42.1 11/16/2015   MCV 94.0 11/16/2015   PLT 107 (L) 11/16/2015      Component Value Date/Time   NA 133 (L) 11/16/2015 0445   K 3.7 11/16/2015 0445   CL 101 11/16/2015 0445   CO2 21 (L) 11/16/2015 0445   GLUCOSE 136 (H) 11/16/2015 0445   BUN 13 11/16/2015 0445   CREATININE 0.65 11/16/2015 1723   CALCIUM 8.9 11/16/2015 0445   PROT 7.8 08/10/2015 0530   ALBUMIN 3.6 08/10/2015 0530   AST 59 (H) 08/10/2015 0530   ALT  60 08/10/2015 0530   ALKPHOS 44 08/10/2015 0530   BILITOT 1.3 (H) 08/10/2015 0530   GFRNONAA >60 11/16/2015 1723   GFRAA >60 11/16/2015 1723   Lab Results  Component Value Date   CHOL 151 11/16/2015   HDL 46 11/16/2015   LDLCALC 94 11/16/2015   TRIG 57 11/16/2015   CHOLHDL 3.3 11/16/2015   Lab Results  Component Value Date   HGBA1C 6.2 (H) 11/16/2015    ASSESSMENT AND PLAN  71 y.o. 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 Vascular risk factors of hypertension, hyperlipidemia, obesity and intracranial atherosclerosis. Sleep study was negative for obstructive sleep apnea The patient is a current patient of Dr.Sethi  who is out of the office today . This note is sent to the work in doctor.     PLAN: Continue control of risk factors to prevent further stroke Continue Plavix for secondary stroke prevention Blood pressure goal below 130/90 todays reading 142/80 continue blood pressure medications Diabetes with hemoglobin A1c below 6.5, continue Levemir LDL cholesterol goal below 70, continue Lipitor Continue exercise   Continue follow-up with primary care for management of stroke risk factors Discharge from stroke clinic I spent 25 minutes in total face to face time with the patient more than 50% of which was spent counseling and coordination of care, reviewing  test results reviewing medications and discussing and reviewing the diagnosis of stroke and importance of management of risk factors Dennie Bible, [facility], [facility], APRN  [facility] [address] [place], [place] [address] [phone]

## 2017-01-15 ENCOUNTER — Ambulatory Visit (INDEPENDENT_AMBULATORY_CARE_PROVIDER_SITE_OTHER): Payer: Medicare Other | Admitting: Nurse Practitioner

## 2017-01-15 ENCOUNTER — Encounter: Payer: Self-pay | Admitting: Nurse Practitioner

## 2017-01-15 VITALS — BP 142/80 | HR 69 | Wt 242.4 lb

## 2017-01-15 DIAGNOSIS — I10 Essential (primary) hypertension: Secondary | ICD-10-CM

## 2017-01-15 DIAGNOSIS — I63219 Cerebral infarction due to unspecified occlusion or stenosis of unspecified vertebral arteries: Secondary | ICD-10-CM

## 2017-01-15 DIAGNOSIS — Z8673 Personal history of transient ischemic attack (TIA), and cerebral infarction without residual deficits: Secondary | ICD-10-CM | POA: Diagnosis not present

## 2017-01-15 DIAGNOSIS — R269 Unspecified abnormalities of gait and mobility: Secondary | ICD-10-CM | POA: Diagnosis not present

## 2017-01-15 DIAGNOSIS — I69398 Other sequelae of cerebral infarction: Secondary | ICD-10-CM

## 2017-01-15 DIAGNOSIS — E7800 Pure hypercholesterolemia, unspecified: Secondary | ICD-10-CM

## 2017-01-15 NOTE — Patient Instructions (Addendum)
Continue control of risk factors to prevent further stroke Continue Plavix for secondary stroke prevention Blood pressure goal below 130/90 todays reading 142/80 continue blood pressure medications Diabetes with hemoglobin A1c below 6.5, continue Levemir LDL cholesterol goal below 70, continue Lipitor Continue exercise   Discharge from stroke clinic Stroke Prevention Some medical conditions and behaviors are associated with an increased chance of having a stroke. You may prevent a stroke by making healthy choices and managing medical conditions. How can I reduce my risk of having a stroke?  Stay physically active. Get at least 30 minutes of activity on most or all days.  Do not smoke. It may also be helpful to avoid exposure to secondhand smoke.  Limit alcohol use. Moderate alcohol use is considered to be: ? No more than 2 drinks per day for men. ? No more than 1 drink per day for nonpregnant women.  Eat healthy foods. This involves: ? Eating 5 or more servings of fruits and vegetables a day. ? Making dietary changes that address high blood pressure (hypertension), high cholesterol, diabetes, or obesity.  Manage your cholesterol levels. ? Making food choices that are high in fiber and low in saturated fat, trans fat, and cholesterol may control cholesterol levels. ? Take any prescribed medicines to control cholesterol as directed by your health care provider.  Manage your diabetes. ? Controlling your carbohydrate and sugar intake is recommended to manage diabetes. ? Take any prescribed medicines to control diabetes as directed by your health care provider.  Control your hypertension. ? Making food choices that are low in salt (sodium), saturated fat, trans fat, and cholesterol is recommended to manage hypertension. ? Ask your health care provider if you need treatment to lower your blood pressure. Take any prescribed medicines to control hypertension as directed by your health care  provider. ? If you are 55-72 years of age, have your blood pressure checked every 3-5 years. If you are 50 years of age or older, have your blood pressure checked every year.  Maintain a healthy weight. ? Reducing calorie intake and making food choices that are low in sodium, saturated fat, trans fat, and cholesterol are recommended to manage weight.  Stop drug abuse.  Avoid taking birth control pills. ? Talk to your health care provider about the risks of taking birth control pills if you are over 39 years old, smoke, get migraines, or have ever had a blood clot.  Get evaluated for sleep disorders (sleep apnea). ? Talk to your health care provider about getting a sleep evaluation if you snore a lot or have excessive sleepiness.  Take medicines only as directed by your health care provider. ? For some people, aspirin or blood thinners (anticoagulants) are helpful in reducing the risk of forming abnormal blood clots that can lead to stroke. If you have the irregular heart rhythm of atrial fibrillation, you should be on a blood thinner unless there is a good reason you cannot take them. ? Understand all your medicine instructions.  Make sure that other conditions (such as anemia or atherosclerosis) are addressed. Get help right away if:  You have sudden weakness or numbness of the face, arm, or leg, especially on one side of the body.  Your face or eyelid droops to one side.  You have sudden confusion.  You have trouble speaking (aphasia) or understanding.  You have sudden trouble seeing in one or both eyes.  You have sudden trouble walking.  You have dizziness.  You have a  loss of balance or coordination.  You have a sudden, severe headache with no known cause.  You have new chest pain or an irregular heartbeat. Any of these symptoms may represent a serious problem that is an emergency. Do not wait to see if the symptoms will go away. Get medical help at once. Call your local  emergency services (911 in [place] Do not drive yourself to the hospital. This information is not intended to replace advice given to you by your health care provider. Make sure you discuss any questions you have with your health care provider. Document Released: 06/05/2004 Document Revised: 10/04/2015 Document Reviewed: 10/29/2012 Elsevier Interactive Patient Education  2017 [employer].

## 2017-01-16 NOTE — Progress Notes (Signed)
I have reviewed and agreed above plan. 

## 2017-01-21 DIAGNOSIS — I10 Essential (primary) hypertension: Secondary | ICD-10-CM | POA: Diagnosis not present

## 2017-01-21 DIAGNOSIS — Z125 Encounter for screening for malignant neoplasm of prostate: Secondary | ICD-10-CM | POA: Diagnosis not present

## 2017-01-21 DIAGNOSIS — E1151 Type 2 diabetes mellitus with diabetic peripheral angiopathy without gangrene: Secondary | ICD-10-CM | POA: Diagnosis not present

## 2017-01-28 DIAGNOSIS — E784 Other hyperlipidemia: Secondary | ICD-10-CM | POA: Diagnosis not present

## 2017-01-28 DIAGNOSIS — I69959 Hemiplegia and hemiparesis following unspecified cerebrovascular disease affecting unspecified side: Secondary | ICD-10-CM | POA: Diagnosis not present

## 2017-01-28 DIAGNOSIS — M1990 Unspecified osteoarthritis, unspecified site: Secondary | ICD-10-CM | POA: Diagnosis not present

## 2017-01-28 DIAGNOSIS — I69320 Aphasia following cerebral infarction: Secondary | ICD-10-CM | POA: Diagnosis not present

## 2017-01-28 DIAGNOSIS — N401 Enlarged prostate with lower urinary tract symptoms: Secondary | ICD-10-CM | POA: Diagnosis not present

## 2017-01-28 DIAGNOSIS — I10 Essential (primary) hypertension: Secondary | ICD-10-CM | POA: Diagnosis not present

## 2017-01-28 DIAGNOSIS — Z6834 Body mass index (BMI) 34.0-34.9, adult: Secondary | ICD-10-CM | POA: Diagnosis not present

## 2017-01-28 DIAGNOSIS — E1151 Type 2 diabetes mellitus with diabetic peripheral angiopathy without gangrene: Secondary | ICD-10-CM | POA: Diagnosis not present

## 2017-01-28 DIAGNOSIS — Z1389 Encounter for screening for other disorder: Secondary | ICD-10-CM | POA: Diagnosis not present

## 2017-01-28 DIAGNOSIS — K760 Fatty (change of) liver, not elsewhere classified: Secondary | ICD-10-CM | POA: Diagnosis not present

## 2017-01-28 DIAGNOSIS — Z23 Encounter for immunization: Secondary | ICD-10-CM | POA: Diagnosis not present

## 2017-01-28 DIAGNOSIS — Z0000 Encounter for general adult medical examination without abnormal findings: Secondary | ICD-10-CM | POA: Diagnosis not present

## 2017-01-28 DIAGNOSIS — I2510 Atherosclerotic heart disease of native coronary artery without angina pectoris: Secondary | ICD-10-CM | POA: Diagnosis not present

## 2017-03-02 DIAGNOSIS — R531 Weakness: Secondary | ICD-10-CM | POA: Diagnosis not present

## 2017-03-02 DIAGNOSIS — I69959 Hemiplegia and hemiparesis following unspecified cerebrovascular disease affecting unspecified side: Secondary | ICD-10-CM | POA: Diagnosis not present

## 2017-03-02 DIAGNOSIS — R8290 Unspecified abnormal findings in urine: Secondary | ICD-10-CM | POA: Diagnosis not present

## 2017-03-02 DIAGNOSIS — R3911 Hesitancy of micturition: Secondary | ICD-10-CM | POA: Diagnosis not present

## 2017-03-02 DIAGNOSIS — Z6833 Body mass index (BMI) 33.0-33.9, adult: Secondary | ICD-10-CM | POA: Diagnosis not present

## 2017-03-02 DIAGNOSIS — D696 Thrombocytopenia, unspecified: Secondary | ICD-10-CM | POA: Diagnosis not present

## 2017-03-02 DIAGNOSIS — R509 Fever, unspecified: Secondary | ICD-10-CM | POA: Diagnosis not present

## 2017-03-04 DIAGNOSIS — M79674 Pain in right toe(s): Secondary | ICD-10-CM | POA: Diagnosis not present

## 2017-03-04 DIAGNOSIS — M5137 Other intervertebral disc degeneration, lumbosacral region: Secondary | ICD-10-CM | POA: Diagnosis not present

## 2017-03-04 DIAGNOSIS — S335XXA Sprain of ligaments of lumbar spine, initial encounter: Secondary | ICD-10-CM | POA: Diagnosis not present

## 2017-03-04 DIAGNOSIS — M545 Low back pain: Secondary | ICD-10-CM | POA: Diagnosis not present

## 2017-03-04 DIAGNOSIS — M79644 Pain in right finger(s): Secondary | ICD-10-CM | POA: Diagnosis not present

## 2017-03-04 DIAGNOSIS — M20011 Mallet finger of right finger(s): Secondary | ICD-10-CM | POA: Diagnosis not present

## 2017-03-18 DIAGNOSIS — M79644 Pain in right finger(s): Secondary | ICD-10-CM | POA: Diagnosis not present

## 2017-03-18 DIAGNOSIS — M546 Pain in thoracic spine: Secondary | ICD-10-CM | POA: Diagnosis not present

## 2017-03-18 DIAGNOSIS — M545 Low back pain: Secondary | ICD-10-CM | POA: Diagnosis not present

## 2017-03-18 DIAGNOSIS — M20011 Mallet finger of right finger(s): Secondary | ICD-10-CM | POA: Diagnosis not present

## 2017-03-19 DIAGNOSIS — M546 Pain in thoracic spine: Secondary | ICD-10-CM | POA: Diagnosis not present

## 2017-03-23 DIAGNOSIS — M546 Pain in thoracic spine: Secondary | ICD-10-CM | POA: Diagnosis not present

## 2017-03-23 DIAGNOSIS — M545 Low back pain: Secondary | ICD-10-CM | POA: Diagnosis not present

## 2017-03-23 DIAGNOSIS — S22000A Wedge compression fracture of unspecified thoracic vertebra, initial encounter for closed fracture: Secondary | ICD-10-CM | POA: Diagnosis not present

## 2017-03-30 DIAGNOSIS — R531 Weakness: Secondary | ICD-10-CM | POA: Diagnosis not present

## 2017-03-31 ENCOUNTER — Other Ambulatory Visit: Payer: Self-pay

## 2017-03-31 DIAGNOSIS — I714 Abdominal aortic aneurysm, without rupture, unspecified: Secondary | ICD-10-CM

## 2017-04-06 DIAGNOSIS — M20011 Mallet finger of right finger(s): Secondary | ICD-10-CM | POA: Diagnosis not present

## 2017-04-09 DIAGNOSIS — E119 Type 2 diabetes mellitus without complications: Secondary | ICD-10-CM | POA: Diagnosis not present

## 2017-04-14 NOTE — Progress Notes (Signed)
Cardiology Office Note    Date:  04/15/2017   ID:  Ahamed, Hofland 12-29-1945, MRN [id number]  PCP:  [name], MD  Cardiologist: Dr. Martinique   Chief Complaint  Patient presents with  . Follow-up    6 month visit    History of Present Illness:    Burk Hoctor is a 71 y.o. male with past medical history of CAD (s/p known occlusion of LAD and RCA by cath in 1992 with cath in 2017 showing similar findings with collateral flow noted), chronic combined systolic and diastolic CHF, HTN, HLD, Type 2 DM, GERD, and prior CVA who presents to the office today for 55-month follow-up.   He was last examined by Dr. Martinique in 10/2016 and reported doing well, exercising 5 days per week and denying any recent anginal symptoms. He was continued on his current medication regimen.     In talking with the patient today, he reports overall doing well from a cardiac perspective since his last office visit. He did experience a fall approximately 1 month ago and says this occurred in the setting of a urinary tract infection. He denies any syncopal events at that time.  No recent chest discomfort, dyspnea on exertion, orthopnea, PND, or lower extremity edema.  He has been followed by [facility] for a crushed vertebrae and says that his recent CT imaging showed an aortic aneurysm which he believes was at 4.4cm. He is scheduled to see Vascular Surgery later this month for further evaluation.   Past Medical History:  Diagnosis Date  . Aortic stenosis    a. 11/2015 Echo: EF 55-60%, Gr1 DD, mild AS.  Marland Kitchen Chronic combined systolic and diastolic CHF (congestive heart failure) ([place])    a. 07/2015: TEE showing EF of 40% b. 11/2015: Echo w/ EF 55-60%, Grade 1 DD, mild AS.  Marland Kitchen Coronary artery disease    a. patient reports 100% stenosis of LAD and RCA by cath in 1999. b. 11/2015 Cath: LM nl, LAD 100 - fills by L->L collats from D1, LCX 60d, OM1 lalrge/nl, OM2 small/nl, RCA 123m, L->R  collats.EF 35-45% (55-60% by echo).  . Diabetes mellitus without complication ([place])   . GERD (gastroesophageal reflux disease)   . Hyperlipemia   . Hypertension   . Hypertensive heart disease   . Stroke [facility])    a. 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 Thrombocytopenia ([place]) 11/2015    Past Surgical History:  Procedure Laterality Date  . CARDIAC CATHETERIZATION    . CARDIAC CATHETERIZATION N/A 11/16/2015   Procedure: Left Heart Cath and Coronary Angiography;  Surgeon: [name], MD;  Location: [place] CV [facility];  Service: Cardiovascular;  Laterality: N/A;  . MOLE REMOVAL    . TONSILLECTOMY      Current Medications: Outpatient Medications Prior to Visit  Medication Sig Dispense Refill  . atorvastatin (LIPITOR) 80 MG tablet Take 1 tablet (80 mg total) by mouth daily at 6 PM. 30 tablet 6  . clopidogrel (PLAVIX) 75 MG tablet Take 1 tablet (75 mg total) by mouth daily. 30 tablet 1  . insulin detemir (LEVEMIR) 100 UNIT/ML injection Inject 0.22 mLs (22 Units total) into the skin 2 (two) times daily. 10 mL 11  . LEVEMIR FLEXTOUCH 100 UNIT/ML Pen     . losartan (COZAAR) 50 MG tablet Take 1 tablet (50 mg total) by mouth daily. 90 tablet 2  . nitroGLYCERIN (NITROSTAT) 0.4 MG SL tablet Place 0.4 mg under the tongue  every 5 (five) minutes as needed for chest pain (x 3 doses).    . carvedilol (COREG) 25 MG tablet Take 1 tablet (25 mg total) by mouth 2 (two) times daily. 60 tablet 1   No facility-administered medications prior to visit.      Allergies:   Cheese and Pravastatin   Social History   Socioeconomic History  . Marital status: Married    Spouse name: [name]  . Number of children: 0  . Years of education: College  . Highest education level: None  Social Needs  . Financial resource strain: None  . Food insecurity - worry: None  . Food insecurity - inability: None  . Transportation needs - medical: None  . Transportation needs - non-medical: None  Occupational History    . Occupation: Retired   Tobacco Use  . Smoking status: Never Smoker  . Smokeless tobacco: Never Used  Substance and Sexual Activity  . Alcohol use: Yes    Alcohol/week: 0.6 oz    Types: 1 Shots of liquor per week    Comment: cocktails every day  . Drug use: No  . Sexual activity: None  Other Topics Concern  . None  Social History Narrative   Drinks coffee daily      Family History:  The patient's family history includes Heart disease in his mother; Hypertension in his mother.   Review of Systems:   Please see the history of present illness.     General:  No chills, fever, night sweats or weight changes. Positive for back pain.  Cardiovascular:  No chest pain, dyspnea on exertion, edema, orthopnea, palpitations, paroxysmal nocturnal dyspnea. Dermatological: No rash, lesions/masses Respiratory: No cough, dyspnea Urologic: No hematuria, dysuria Abdominal:   No nausea, vomiting, diarrhea, bright red blood per rectum, melena, or hematemesis Neurologic:  No visual changes, wkns, changes in mental status.  All other systems reviewed and are otherwise negative except as noted above.   Physical Exam:    VS:  BP 132/82 (BP Location: Left Arm)   Pulse 94   Ht 5\' 10"  (1.778 m)   Wt 228 lb 9.6 oz (103.7 kg)   SpO2 97%   BMI 32.80 kg/m    General: Well developed, elderly Caucasian male appearing in no acute distress. Head: Normocephalic, atraumatic, sclera non-icteric, no xanthomas, nares are without discharge.  Neck: No carotid bruits. JVD not elevated.  Lungs: Respirations regular and unlabored, without wheezes or rales.  Heart: Regular rate and rhythm. No S3 or S4.  No murmur, no rubs, or gallops appreciated. Abdomen: Soft, non-tender, non-distended with normoactive bowel sounds. No hepatomegaly. No rebound/guarding. No obvious abdominal masses. Msk:  Strength and tone appear normal for age. No joint deformities or effusions. Extremities: No clubbing or cyanosis. No lower  extremity edema.  Distal pedal pulses are 2+ bilaterally. Neuro: Alert and oriented X 3. Moves all extremities spontaneously. No focal deficits noted. Psych:  Responds to questions appropriately with a normal affect. Skin: No rashes or lesions noted  Wt Readings from Last 3 Encounters:  04/15/17 228 lb 9.6 oz (103.7 kg)  01/15/17 242 lb 6.4 oz (110 kg)  10/15/16 238 lb 12.8 oz (108.3 kg)     Studies/Labs Reviewed:   EKG:  EKG is not ordered today.   Recent Labs: No results found for requested labs within last 8760 hours.   Lipid Panel    Component Value Date/Time   CHOL 151 11/16/2015 0702   TRIG 57 11/16/2015 0702   HDL 46  11/16/2015 0702   CHOLHDL 3.3 11/16/2015 0702   VLDL 11 11/16/2015 0702   LDLCALC 94 11/16/2015 0702    Additional studies/ records that were reviewed today include:   Cardiac Catheterization: 11/2015 1. Mid RCA lesion, 100% stenosed. 2. Ost 1st Diag to 1st Diag lesion, 70% stenosed. 3. Mid LAD lesion, 100% stenosed. 4. Dist RCA lesion, 100% stenosed. 5. Dist Cx lesion, 60% stenosed.    Total occlusion of the proximal RCA. RCA is heavily calcified. RCA fills by collaterals from the circumflex coronary of the left system. The right coronary is large in distribution.  Total occlusion of the proximal to mid LAD after the origin of the first of perforator and first diagonal. Left to left collateral supply the LAD and a second diagonal. The first diagonal contains eccentric 50-70% narrowing.  Widely patent circumflex including a small ramus intermedius/first obtuse marginal, and a large branching second obtuse marginal. The distal circumflex beyond the second marginal is small and contains 50-70% narrowing. The circumflex is the source of collaterals to the distal right coronary.  Mildly depressed LV function with estimated EF in the 35 to 40% range. LVEDP is mildly elevated. The inferior wall is hypokinetic.   RECOMMENDATIONS:  Images were reviewed  with Dr. Martinique. The plan at this time is to continue medical therapy unless symptoms progress.  Aggressive risk factor modification.  Heart failure therapy as indicated.  Echocardiogram: 11/2015 Study Conclusions  - Left ventricle: The cavity size was normal. Systolic function was   normal. The estimated ejection fraction was in the range of 55%   to 60%. Wall motion was normal; there were no regional wall   motion abnormalities. There was an increased relative   contribution of atrial contraction to ventricular filling.   Doppler parameters are consistent with abnormal left ventricular   relaxation (grade 1 diastolic dysfunction). - Aortic valve: Moderately calcified annulus. Trileaflet. Moderate   diffuse thickening and calcification. There was mild stenosis.   Valve area (VTI): 2.17 cm^2. Valve area (Vmax): 1.88 cm^2. Valve   area (Vmean): 1.75 cm^2.   Assessment:    1. Coronary artery disease involving native coronary artery of native heart without angina pectoris   2. Chronic combined systolic and diastolic CHF (congestive heart failure) ([place])   3. Essential hypertension   4. Hyperlipidemia LDL goal <70   5. Type 2 diabetes mellitus with other circulatory complication, with long-term current use of insulin ([place])   6. AAA (abdominal aortic aneurysm) without rupture ([place])      Plan:   In order of problems listed above:  1. CAD - the patient has known occlusion of the LAD and RCA by cath in 1992 with repeat cath in 2017 showing similar findings with collateral flow noted.  - he denies any recent chest pain or dyspnea on exertion.  - continue Plavix, BB, and statin therapy.   2. Chronic Combined Systolic and Diastolic CHF - cath in 68/3419 showed a reduced EF of 35-40% but EF was preserved at 55-60% by echo at that time.  - he does not appear volume overloaded by physical examination.  - continue Coreg 25mg  BID and Losartan 50mg  daily.   3. HTN - BP is  well-controlled at 132/82 during today's visit. - continue current medication regimen.   4. HLD - most recent FLP in 04/2016 showed total cholesterol of 147, HDL 63, and LDL 73. Goal LDL is < 70 with known CAD. - followed by PCP. Remains on high-dose Atorvastatin 80mg   daily.  5. Type 2 DM -  Followed by PCP. Hgb A1c was well-controlled at 5.5 when checked in 04/2016.  6. AAA - recently diagnosed during a CT Scan following his back injury. Results are not available in Epic but he believes the AAA was at 4.4 cm. He is scheduled for an ultrasound later this month and follow-up with Vascular Surgery afterwards.    Medication Adjustments/Labs and Tests Ordered: Current medicines are reviewed at length with the patient today.  Concerns regarding medicines are outlined above.  Medication changes, Labs and Tests ordered today are listed in the Patient Instructions below. Patient Instructions  Medication Instructions:  NO CHANGES If you need a refill on your cardiac medications before your next appointment, please call your pharmacy.  Follow-Up: Your physician wants you to follow-up in: 6 MONTHS WITH DR Martinique. You should receive a reminder letter in the mail two months in advance. If you do not receive a letter, please call our office April 2019 to schedule the June 2019 follow-up appointment.   Thank you for choosing [facility] at [facility], [place], [place]  04/15/2017 7:28 PM    [address], [place], [place]  [address] Phone: [phone]; Fax: [phone]  [address] [place], [place] [address] Phone: [phone]

## 2017-04-15 ENCOUNTER — Ambulatory Visit (INDEPENDENT_AMBULATORY_CARE_PROVIDER_SITE_OTHER): Payer: Medicare Other | Admitting: Student

## 2017-04-15 ENCOUNTER — Encounter: Payer: Self-pay | Admitting: Student

## 2017-04-15 VITALS — BP 132/82 | HR 94 | Ht 70.0 in | Wt 228.6 lb

## 2017-04-15 DIAGNOSIS — I2510 Atherosclerotic heart disease of native coronary artery without angina pectoris: Secondary | ICD-10-CM | POA: Diagnosis not present

## 2017-04-15 DIAGNOSIS — I5042 Chronic combined systolic (congestive) and diastolic (congestive) heart failure: Secondary | ICD-10-CM | POA: Diagnosis not present

## 2017-04-15 DIAGNOSIS — I714 Abdominal aortic aneurysm, without rupture, unspecified: Secondary | ICD-10-CM

## 2017-04-15 DIAGNOSIS — I10 Essential (primary) hypertension: Secondary | ICD-10-CM | POA: Diagnosis not present

## 2017-04-15 DIAGNOSIS — E1159 Type 2 diabetes mellitus with other circulatory complications: Secondary | ICD-10-CM | POA: Diagnosis not present

## 2017-04-15 DIAGNOSIS — I63219 Cerebral infarction due to unspecified occlusion or stenosis of unspecified vertebral arteries: Secondary | ICD-10-CM

## 2017-04-15 DIAGNOSIS — E785 Hyperlipidemia, unspecified: Secondary | ICD-10-CM

## 2017-04-15 DIAGNOSIS — Z794 Long term (current) use of insulin: Secondary | ICD-10-CM

## 2017-04-15 NOTE — Patient Instructions (Signed)
Medication Instructions:  NO CHANGES If you need a refill on your cardiac medications before your next appointment, please call your pharmacy.  Follow-Up: Your physician wants you to follow-up in: 6 MONTHS WITH DR Martinique. You should receive a reminder letter in the mail two months in advance. If you do not receive a letter, please call our office April 2019 to schedule the June 2019 follow-up appointment.   Thank you for choosing [facility] at [facility]!!

## 2017-04-25 ENCOUNTER — Telehealth: Payer: Self-pay | Admitting: Hematology

## 2017-04-25 NOTE — Telephone Encounter (Signed)
sw pt wife to confirm new patient hematology appt 05/11/17 to 11 am. Pt aware to arrive 30 minutes early

## 2017-04-27 DIAGNOSIS — S22080D Wedge compression fracture of T11-T12 vertebra, subsequent encounter for fracture with routine healing: Secondary | ICD-10-CM | POA: Diagnosis not present

## 2017-05-06 ENCOUNTER — Ambulatory Visit (HOSPITAL_COMMUNITY)
Admission: RE | Admit: 2017-05-06 | Discharge: 2017-05-06 | Disposition: A | Discharge status: Home / Self Care | Payer: Medicare Other | Source: Ambulatory Visit | Attending: Surgery | Admitting: Surgery

## 2017-05-06 ENCOUNTER — Encounter: Payer: Self-pay | Admitting: Surgery

## 2017-05-06 ENCOUNTER — Ambulatory Visit (INDEPENDENT_AMBULATORY_CARE_PROVIDER_SITE_OTHER): Payer: Medicare Other | Admitting: Surgery

## 2017-05-06 VITALS — BP 156/81 | HR 65 | Temp 98.4°F | Resp 17 | Ht 70.0 in | Wt 231.9 lb

## 2017-05-06 DIAGNOSIS — I63219 Cerebral infarction due to unspecified occlusion or stenosis of unspecified vertebral arteries: Secondary | ICD-10-CM | POA: Diagnosis not present

## 2017-05-06 DIAGNOSIS — I714 Abdominal aortic aneurysm, without rupture, unspecified: Secondary | ICD-10-CM

## 2017-05-06 NOTE — Progress Notes (Signed)
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 male, who is referred today for evaluation of an abdominal aortic aneurysm.  The patient recently had a fall.  He was seen by Dr. Rolena Infante who ultimately ordered a MRI which revealed a 4.6 cm aneurysm.  The patient is not having any abdominal pain.  This was an incidental finding.  Patient has a history of coronary artery disease.  He has known two-vessel occlusion with collaterals.  He has never had a stent or bypass.  He does have a history of stroke approximately a year and a half ago.  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  It occurred in [place].  The only imaging studies are a report from a CT scan that showed his carotid bifurcations were widely patent.  His residual deficits are some right-sided weakness and speech difficulties.  The patient is on statin therapy for hypercholesterolemia.  He is medically managed for hypertension with an ARB.  He is a non-smoker.  Thanks you just  PAST MEDICAL HISTORY    Past Medical History:  Diagnosis Date  . Aortic stenosis    a. 11/2015 Echo: EF 55-60%, Gr1 DD, mild AS.  Marland Kitchen Chronic combined systolic and diastolic CHF (congestive heart failure) ([place])    a. 07/2015: TEE showing EF of 40% b. 11/2015: Echo w/ EF 55-60%, Grade 1 DD, mild AS.  Marland Kitchen Coronary artery disease    a. patient reports 100% stenosis of LAD and RCA by cath in 1999. b. 11/2015 Cath: LM nl, LAD 100 - fills by L->L collats from D1, LCX 60d, OM1 lalrge/nl, OM2 small/nl, RCA  158m, L->R collats.EF 35-45% (55-60% by echo).  . Diabetes mellitus without complication ([place])   . GERD (gastroesophageal reflux disease)   . Hyperlipemia   . Hypertension   . Hypertensive heart disease   . Stroke [facility])    a. 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 Thrombocytopenia ([place]) 11/2015     FAMILY HISTORY   Family History  Problem Relation Age of Onset  . Hypertension Mother   . Heart disease Mother     SOCIAL HISTORY:   Social History   Socioeconomic History  . Marital status: Married    Spouse name: [name]  . Number of children: 0  . Years of education: College  . Highest education level: Not on file  Social Needs  . Financial resource strain: Not on file  . Food insecurity - worry: Not on file  . Food insecurity - inability: Not on file  . Transportation needs - medical: Not on file  . Transportation needs - non-medical: Not on file  Occupational History  . Occupation: Retired   Tobacco Use  . Smoking status: Never Smoker  . Smokeless tobacco: Never Used  Substance and Sexual Activity  . Alcohol use: Yes    Alcohol/week: 0.6 oz    Types: 1 Shots of liquor per week    Comment: cocktails every day  . Drug use: No  . Sexual activity:  Not on file  Other Topics Concern  . Not on file  Social History Narrative   Drinks coffee daily     ALLERGIES:    Allergies  Allergen Reactions  . Cheese Nausea And Vomiting  . Pravastatin     Anxiety attack    CURRENT MEDICATIONS:    Current Outpatient Medications  Medication Sig Dispense Refill  . atorvastatin (LIPITOR) 80 MG tablet Take 1 tablet (80 mg total) by mouth daily at 6 PM. 30 tablet 6  . clopidogrel (PLAVIX) 75 MG tablet Take 1 tablet (75 mg total) by mouth daily. 30 tablet 1  . insulin detemir (LEVEMIR) 100 UNIT/ML injection Inject 0.22 mLs (22 Units total) into the skin 2 (two) times daily. 10 mL 11  . LEVEMIR FLEXTOUCH 100 UNIT/ML Pen     . losartan (COZAAR) 50 MG tablet Take 1 tablet  (50 mg total) by mouth daily. 90 tablet 2  . nitroGLYCERIN (NITROSTAT) 0.4 MG SL tablet Place 0.4 mg under the tongue every 5 (five) minutes as needed for chest pain (x 3 doses).    . carvedilol (COREG) 25 MG tablet Take 1 tablet (25 mg total) by mouth 2 (two) times daily. 60 tablet 1   No current facility-administered medications for this visit.     REVIEW OF SYSTEMS:   [X]  denotes positive finding, [ ]  denotes negative finding Cardiac  Comments:  Chest pain or chest pressure:    Shortness of breath upon exertion:    Short of breath when lying flat:    Irregular heart rhythm:        Vascular    Pain in calf, thigh, or hip brought on by ambulation:    Pain in feet at night that wakes you up from your sleep:     Blood clot in your veins:    Leg swelling:         Pulmonary    Oxygen at home:    Productive cough:     Wheezing:         Neurologic    Sudden weakness in arms or legs:  x   Sudden numbness in arms or legs:     Sudden onset of difficulty speaking or slurred speech: x   Temporary loss of vision in one eye:     Problems with dizziness:         Gastrointestinal    Blood in stool:      Vomited blood:         Genitourinary    Burning when urinating:     Blood in urine:        Psychiatric    Major depression:         Hematologic    Bleeding problems:    Problems with blood clotting too easily:        Skin    Rashes or ulcers:        Constitutional    Fever or chills:     PHYSICAL EXAM:   Vitals:   05/06/17 0906  BP: (!) 156/81  Pulse: 65  Resp: 17  Temp: 98.4 F (36.9 C)  TempSrc: Oral  SpO2: 100%  Weight: 231 lb 14.4 oz (105.2 kg)  Height: 5\' 10"  (1.778 m)    GENERAL: The patient is a well-nourished male, in no acute distress. The vital signs are documented above. CARDIAC: There is a regular rate and rhythm.  VASCULAR: no carotid bruits.  plapable right DP, non palpable left PULMONARY: Nonlabored respirations  ABDOMEN: Soft and non-tender  with normal pitched bowel sounds.  MUSCULOSKELETAL: There are no major deformities or cyanosis. NEUROLOGIC: right sided weakness, slurred speach. SKIN: There are no ulcers or rashes noted. PSYCHIATRIC: The patient has a normal affect.  STUDIES:   Ultrasound was ordered and reviewed today.  This shows a maximum aortic diameter 3.6 cm  I have reviewed the MRI images which shows an aortic diameter 4.6 cm  ASSESSMENT and PLAN   AAA: I discussed with the patient and his wife that we would consider elective repair once his aneurysm reaches greater than 5 cm.  He will need to come back in 6 months with a CT angiogram to get the true diameter of his aneurysm.  With this study, I will also be able to determine if he is going to be a candidate for endovascular repair.  Because of the patient's stroke, I will get carotid Dopplers when he returns for his visit.   Annamarie Major, MD [facility] [phone] Pager [phone]

## 2017-05-07 DIAGNOSIS — M20011 Mallet finger of right finger(s): Secondary | ICD-10-CM | POA: Diagnosis not present

## 2017-05-10 NOTE — Progress Notes (Signed)
[facility]  [phone] Fax:([phone]) [place] consult Note   Patient Care Team: [name], MD as PCP - General (Internal Medicine) 05/11/2017  Referring physician: [name], MD  CHIEF COMPLAINTS/PURPOSE OF CONSULTATION:  Mild thrombocytopenia  HISTORY OF PRESENTING ILLNESS:  Jose Beck 71 y.o. male is here because of thrombocytopenia. He was referred by his PCP Dr. Nanine Means. [name] for Thrombocytopenia.  He presents to my clinic with his wife today.    According to his prior labs from Dr. [name], his PLT levels have been as follows: 81K as of 03/30/2017, 78K as of 03/02/2017, and 95K as of 01/21/2017.  His WBC and hemoglobin has been normal.  He was hospitalized in [place] for stroke in March 2017, and his CBC at that time showed platelet 90K.   He is accompanied by his wife [name] today. He reports that he has been well overall. He was informed that he first had low platelet counts following an UTI on 02/25/2017. He was evaluated by EMS workers due to a fever and chills prior to being evaluated by Dr. [name] and placed on prescription abx. He had a fall the following Wednesday that resulted in a compressed vertebrae. He was then re-evaluated by Dr. [name] to have labs drawn and informed that the platelet counts were low.  Per chart review, on August 10, 2015 he had low platelet count of 84K. Patient wife notes that he had the labs drawn following a stroke and while he was in [place] for Rehab. Wife reports that the patient has only had one stroke with residual right sided weakness.    He denies prior history of abnormal WBC or anemia. He has been treated for DM and HTN. He denies kidney issues or heart failure. His wife notes that in 1992, he had several heart caths with 2 severely blocked arteries and one artery partially blocked. He didn't have a stent placed due to 100% blockage. He has had a prior tonsillectomy and  denies any other open surgeries. He denies family hx of cancer or blood disorders.   He doesn't smoke nor has he ever, he typically has 4-5 cocktails per night for the past 20-30 years. He now drinks a small shot of Shearon Stalls along with coke as his drink. His wife fixes the drinks for him so that it is a small amount of ETOH within the drink. He denies having abnormal liver function or any abnormal liver imaging. He denies any other recreational drug use. He notes that after he fell, he was able to quit ETOH use for a month without any issues.    He notes that he will have a carotid US on 11/02/2017 and an appointment with Dr. Trula Slade.   He was previously advised to discontinue use of Prevacid and he has been switched to Lipitor due to concern for liver issues.   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 He denies previous exposure to chemicals.   On review of systems, pt reports residual right sided weakness s/p his stroke. He denies CP and any other symptoms.     MEDICAL HISTORY:  Past Medical History:  Diagnosis Date  . Aortic stenosis    a. 11/2015 Echo: EF 55-60%, Gr1 DD, mild AS.  Marland Kitchen Chronic combined systolic and diastolic CHF (congestive heart failure) ([place])    a. 07/2015: TEE showing EF of 40% b. 11/2015: Echo w/ EF 55-60%, Grade 1  DD, mild AS.  Marland Kitchen Coronary artery disease    a. patient reports 100% stenosis of LAD and RCA by cath in 1999. b. 11/2015 Cath: LM nl, LAD 100 - fills by L->L collats from D1, LCX 60d, OM1 lalrge/nl, OM2 small/nl, RCA 113m, L->R collats.EF 35-45% (55-60% by echo).  . Diabetes mellitus without complication ([place])   . GERD (gastroesophageal reflux disease)   . Hyperlipemia   . Hypertension   . Hypertensive heart disease   . Stroke [facility])    a. 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 Thrombocytopenia ([place]) 11/2015    SURGICAL HISTORY: Past Surgical History:  Procedure Laterality Date  . CARDIAC CATHETERIZATION    . CARDIAC  CATHETERIZATION N/A 11/16/2015   Procedure: Left Heart Cath and Coronary Angiography;  Surgeon: [name], MD;  Location: [facility];  Service: Cardiovascular;  Laterality: N/A;  . MOLE REMOVAL    . TONSILLECTOMY      SOCIAL HISTORY: Social History   Socioeconomic History  . Marital status: Married    Spouse name: [name]  . Number of children: 0  . Years of education: College  . Highest education level: Not on file  Social Needs  . Financial resource strain: Not on file  . Food insecurity - worry: Not on file  . Food insecurity - inability: Not on file  . Transportation needs - medical: Not on file  . Transportation needs - non-medical: Not on file  Occupational History  . Occupation: Retired   Tobacco Use  . Smoking status: Never Smoker  . Smokeless tobacco: Never Used  Substance and Sexual Activity  . Alcohol use: Yes    Alcohol/week: 0.6 oz    Types: 1 Shots of liquor per week    Comment: cocktails every day  . Drug use: No  . Sexual activity: Not on file  Other Topics Concern  . 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 He denies previous exposure to chemicals.   FAMILY HISTORY: Family History  Problem Relation Age of Onset  . Hypertension Mother   . Heart disease Mother     ALLERGIES:  is allergic to cheese and pravastatin.  MEDICATIONS:  Current Outpatient Medications  Medication Sig Dispense Refill  . atorvastatin (LIPITOR) 80 MG tablet Take 1 tablet (80 mg total) by mouth daily at 6 PM. 30 tablet 6  . carvedilol (COREG) 25 MG tablet Take 1 tablet (25 mg total) by mouth 2 (two) times daily. 60 tablet 1  . clopidogrel (PLAVIX) 75 MG tablet Take 1 tablet (75 mg total) by mouth daily. 30 tablet 1  . insulin detemir (LEVEMIR) 100 UNIT/ML injection Inject 0.22 mLs (22 Units total) into the skin 2 (two) times daily. 10 mL 11  . losartan (COZAAR) 50 MG tablet Take 1  tablet (50 mg total) by mouth daily. 90 tablet 2  . nitroGLYCERIN (NITROSTAT) 0.4 MG SL tablet Place 0.4 mg under the tongue every 5 (five) minutes as needed for chest pain (x 3 doses).     No current facility-administered medications for this visit.     REVIEW OF SYSTEMS:   Constitutional: Denies fevers, chills or abnormal night sweats Eyes: Denies blurriness of vision, double vision or watery eyes Ears, nose, mouth, throat, and face: Denies mucositis or sore throat Respiratory: Denies cough, dyspnea or wheezes Cardiovascular: Denies palpitation, chest discomfort or  lower extremity swelling Gastrointestinal:  Denies nausea, heartburn or change in bowel habits Skin: Denies abnormal skin rashes Lymphatics: Denies new lymphadenopathy or easy bruising Neurological:Denies numbness, tingling or new weaknesses. (+) right sided residual weakness s/p stroke Behavioral/Psych: Mood is stable, no new changes  All other systems were reviewed with the patient and are negative.  PHYSICAL EXAMINATION:   Vitals:   05/11/17 1051  BP: (!) 174/72  Pulse: 64  Resp: 17  Temp: 97.9 F (36.6 C)  SpO2: 100%   Filed Weights   05/11/17 1051  Weight: 232 lb 14.4 oz (105.6 kg)    GENERAL:alert, no distress and comfortable SKIN: skin color, texture, turgor are normal, no rashes or significant lesions EYES: normal, conjunctiva are pink and non-injected, sclera clear OROPHARYNX:no exudate, no erythema and lips, buccal mucosa, and tongue normal  NECK: supple, thyroid normal size, non-tender, without nodularity LYMPH:  no palpable lymphadenopathy in the cervical, axillary or inguinal LUNGS: clear to auscultation and percussion with normal breathing effort HEART: regular rate & rhythm and (+) systolic murmur in the aortic valve area and no lower extremity edema ABDOMEN:abdomen soft, non-tender and normal bowel sounds. No hepatosplenomegaly.  Musculoskeletal:no cyanosis of digits and no clubbing  PSYCH:  alert & oriented x 3 with fluent speech NEURO: no focal motor/sensory deficits  LABORATORY DATA:  I have reviewed the data as listed CBC Latest Ref Rng & Units 11/16/2015 11/16/2015 08/17/2015  WBC 4.0 - 10.5 K/uL 4.4 3.1(L) 4.6  Hemoglobin 13.0 - 17.0 g/dL 14.8 14.4 14.8  Hematocrit 39.0 - 52.0 % 42.1 41.1 42.9  Platelets 150 - 400 K/uL 107(L) 84(L) 99(L)    CMP Latest Ref Rng & Units 11/16/2015 11/16/2015 08/10/2015  Glucose 65 - 99 mg/dL - 136(H) 173(H)  BUN 6 - 20 mg/dL - 13 12  Creatinine 0.61 - 1.24 mg/dL 0.65 0.76 0.90  Sodium 135 - 145 mmol/L - 133(L) 133(L)  Potassium 3.5 - 5.1 mmol/L - 3.7 3.7  Chloride 101 - 111 mmol/L - 101 102  CO2 22 - 32 mmol/L - 21(L) 22  Calcium 8.9 - 10.3 mg/dL - 8.9 9.0  Total Protein 6.5 - 8.1 g/dL - - 7.8  Total Bilirubin 0.3 - 1.2 mg/dL - - 1.3(H)  Alkaline Phos 38 - 126 U/L - - 44  AST 15 - 41 U/L - - 59(H)  ALT 17 - 63 U/L - - 60     RADIOGRAPHIC STUDIES: I have personally reviewed the radiological images as listed and agreed with the findings in the report. No results found.  ASSESSMENT & PLAN:  No problem-specific Assessment & Plan notes found for this encounter.   1. 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  His WBC and hemoglobin has been normal. -I discussed the common etiology for isolated chronic mild thrombocytopenia, which include autoimmune related (ITP), liver disease, splenomegaly, medication or alcohol induced, chronic infection such as hepatitis C and HIV, and bone marrow disease such as MDS. The other etiology such as infection, malignancy, microangiopathy, DIC a less likely given the indolent course and his clinical presentation. -I reviewed his medication list and I do not see any medication which can potentially induce thrombocytopenia. He is on plavix due to  his prior stroke -He does drink alcohol moderately and I recommend him to stop drinking alcohol, and we'll repeat his CBC. -I recommend Ultrasound  of abdomen to evaluate his liver and spleen, but he is scheduled to have a CT abdomen in June 2019 by his vascular surgeon, I will wait for his CT scan then. -We will also check hepatitis B and C to rule out chronic infection -We discussed the risk of bleeding from thrombocytopenia. Giving the mild degree of thrombocytopenia, the risk of bleeding is not very high. But he knows to avoid injury.  -Recommended continue follow up with his PCP -Patient and his wife advised to call the office if the patient has excessive bleeding or bruising  -Repeat lab every 3 months -Follow up in 6 months for evaluation following CT which is ordered by his vascular surgeon.     Plan:  -Lab CBC, HBV and HCV in 3 month -Lab and f/u in 6 months    All questions were answered. The patient knows to call the clinic with any problems, questions or concerns. I spent 30 minutes counseling the patient face to face. The total time spent in the appointment was 40 minutes and more than 50% was on counseling.     Truitt Merle, MD 05/11/17   This document serves as a record of services personally performed by Truitt Merle, MD. It was created on her behalf by Steva Colder, a trained medical scribe. The creation of this record is based on the scribe's personal observations and the provider's statements to them.   I have reviewed the above documentation for accuracy and completeness, and I agree with the above.

## 2017-05-11 ENCOUNTER — Telehealth: Payer: Self-pay | Admitting: Hematology

## 2017-05-11 ENCOUNTER — Encounter: Payer: Self-pay | Admitting: Hematology

## 2017-05-11 ENCOUNTER — Ambulatory Visit (HOSPITAL_BASED_OUTPATIENT_CLINIC_OR_DEPARTMENT_OTHER): Payer: Medicare Other | Admitting: Hematology

## 2017-05-11 VITALS — BP 174/72 | HR 64 | Temp 97.9°F | Resp 17 | Ht 70.0 in | Wt 232.9 lb

## 2017-05-11 DIAGNOSIS — I10 Essential (primary) hypertension: Secondary | ICD-10-CM | POA: Diagnosis not present

## 2017-05-11 DIAGNOSIS — E119 Type 2 diabetes mellitus without complications: Secondary | ICD-10-CM

## 2017-05-11 DIAGNOSIS — Z7289 Other problems related to lifestyle: Secondary | ICD-10-CM

## 2017-05-11 DIAGNOSIS — D696 Thrombocytopenia, unspecified: Secondary | ICD-10-CM

## 2017-05-11 NOTE — Telephone Encounter (Signed)
Gave patient avs and calendar with appts per 12/31 los.  °

## 2017-05-12 ENCOUNTER — Encounter: Payer: Self-pay | Admitting: Hematology

## 2017-05-14 ENCOUNTER — Encounter: Payer: Self-pay | Admitting: Specialist

## 2017-05-21 DIAGNOSIS — N401 Enlarged prostate with lower urinary tract symptoms: Secondary | ICD-10-CM | POA: Diagnosis not present

## 2017-05-21 DIAGNOSIS — M1990 Unspecified osteoarthritis, unspecified site: Secondary | ICD-10-CM | POA: Diagnosis not present

## 2017-05-21 DIAGNOSIS — I69320 Aphasia following cerebral infarction: Secondary | ICD-10-CM | POA: Diagnosis not present

## 2017-05-21 DIAGNOSIS — E1151 Type 2 diabetes mellitus with diabetic peripheral angiopathy without gangrene: Secondary | ICD-10-CM | POA: Diagnosis not present

## 2017-05-21 DIAGNOSIS — I69959 Hemiplegia and hemiparesis following unspecified cerebrovascular disease affecting unspecified side: Secondary | ICD-10-CM | POA: Diagnosis not present

## 2017-05-21 DIAGNOSIS — Z1389 Encounter for screening for other disorder: Secondary | ICD-10-CM | POA: Diagnosis not present

## 2017-05-21 DIAGNOSIS — E669 Obesity, unspecified: Secondary | ICD-10-CM | POA: Diagnosis not present

## 2017-05-21 DIAGNOSIS — I714 Abdominal aortic aneurysm, without rupture: Secondary | ICD-10-CM | POA: Diagnosis not present

## 2017-05-21 DIAGNOSIS — K760 Fatty (change of) liver, not elsewhere classified: Secondary | ICD-10-CM | POA: Diagnosis not present

## 2017-05-21 DIAGNOSIS — I2510 Atherosclerotic heart disease of native coronary artery without angina pectoris: Secondary | ICD-10-CM | POA: Diagnosis not present

## 2017-05-21 DIAGNOSIS — E7849 Other hyperlipidemia: Secondary | ICD-10-CM | POA: Diagnosis not present

## 2017-07-21 ENCOUNTER — Other Ambulatory Visit: Payer: Self-pay

## 2017-07-21 ENCOUNTER — Encounter (HOSPITAL_COMMUNITY): Payer: Self-pay | Admitting: Emergency Medicine

## 2017-07-21 ENCOUNTER — Emergency Department (HOSPITAL_COMMUNITY)
Admission: EM | Admit: 2017-07-21 | Discharge: 2017-07-21 | Disposition: A | Discharge status: Home / Self Care | Payer: Medicare Other | Attending: Emergency Medicine | Admitting: Emergency Medicine

## 2017-07-21 ENCOUNTER — Emergency Department (HOSPITAL_COMMUNITY): Payer: Medicare Other

## 2017-07-21 DIAGNOSIS — R531 Weakness: Secondary | ICD-10-CM | POA: Diagnosis not present

## 2017-07-21 DIAGNOSIS — I2510 Atherosclerotic heart disease of native coronary artery without angina pectoris: Secondary | ICD-10-CM | POA: Insufficient documentation

## 2017-07-21 DIAGNOSIS — R404 Transient alteration of awareness: Secondary | ICD-10-CM | POA: Insufficient documentation

## 2017-07-21 DIAGNOSIS — E785 Hyperlipidemia, unspecified: Secondary | ICD-10-CM | POA: Insufficient documentation

## 2017-07-21 DIAGNOSIS — R29810 Facial weakness: Secondary | ICD-10-CM | POA: Diagnosis not present

## 2017-07-21 DIAGNOSIS — G459 Transient cerebral ischemic attack, unspecified: Secondary | ICD-10-CM | POA: Diagnosis not present

## 2017-07-21 DIAGNOSIS — R471 Dysarthria and anarthria: Secondary | ICD-10-CM | POA: Insufficient documentation

## 2017-07-21 DIAGNOSIS — Z79899 Other long term (current) drug therapy: Secondary | ICD-10-CM | POA: Diagnosis not present

## 2017-07-21 DIAGNOSIS — R270 Ataxia, unspecified: Secondary | ICD-10-CM | POA: Diagnosis not present

## 2017-07-21 DIAGNOSIS — I5042 Chronic combined systolic (congestive) and diastolic (congestive) heart failure: Secondary | ICD-10-CM | POA: Diagnosis not present

## 2017-07-21 DIAGNOSIS — I110 Hypertensive heart disease with heart failure: Secondary | ICD-10-CM | POA: Insufficient documentation

## 2017-07-21 DIAGNOSIS — R4701 Aphasia: Secondary | ICD-10-CM | POA: Diagnosis not present

## 2017-07-21 DIAGNOSIS — Z7902 Long term (current) use of antithrombotics/antiplatelets: Secondary | ICD-10-CM | POA: Insufficient documentation

## 2017-07-21 DIAGNOSIS — Z8673 Personal history of transient ischemic attack (TIA), and cerebral infarction without residual deficits: Secondary | ICD-10-CM | POA: Diagnosis not present

## 2017-07-21 DIAGNOSIS — R4789 Other speech disturbances: Secondary | ICD-10-CM | POA: Diagnosis present

## 2017-07-21 DIAGNOSIS — I10 Essential (primary) hypertension: Secondary | ICD-10-CM | POA: Diagnosis not present

## 2017-07-21 LAB — CBC
HCT: 39.8 % (ref 39.0–52.0)
HEMOGLOBIN: 13.5 g/dL (ref 13.0–17.0)
MCH: 32.9 pg (ref 26.0–34.0)
MCHC: 33.9 g/dL (ref 30.0–36.0)
MCV: 97.1 fL (ref 78.0–100.0)
Platelets: 74 10*3/uL — ABNORMAL LOW (ref 150–400)
RBC: 4.1 MIL/uL — AB (ref 4.22–5.81)
RDW: 12.9 % (ref 11.5–15.5)
WBC: 3.6 10*3/uL — ABNORMAL LOW (ref 4.0–10.5)

## 2017-07-21 LAB — URINALYSIS, ROUTINE W REFLEX MICROSCOPIC
Bilirubin Urine: NEGATIVE
Glucose, UA: NEGATIVE mg/dL
Hgb urine dipstick: NEGATIVE
KETONES UR: NEGATIVE mg/dL
LEUKOCYTES UA: NEGATIVE
NITRITE: NEGATIVE
PH: 6 (ref 5.0–8.0)
PROTEIN: NEGATIVE mg/dL
Specific Gravity, Urine: 1.015 (ref 1.005–1.030)

## 2017-07-21 LAB — I-STAT TROPONIN, ED: Troponin i, poc: 0.01 ng/mL (ref 0.00–0.08)

## 2017-07-21 LAB — DIFFERENTIAL
Basophils Absolute: 0 10*3/uL (ref 0.0–0.1)
Basophils Relative: 1 %
EOS PCT: 4 %
Eosinophils Absolute: 0.1 10*3/uL (ref 0.0–0.7)
LYMPHS PCT: 16 %
Lymphs Abs: 0.6 10*3/uL — ABNORMAL LOW (ref 0.7–4.0)
MONO ABS: 0.3 10*3/uL (ref 0.1–1.0)
Monocytes Relative: 9 %
Neutro Abs: 2.6 10*3/uL (ref 1.7–7.7)
Neutrophils Relative %: 70 %

## 2017-07-21 LAB — CBG MONITORING, ED: Glucose-Capillary: 122 mg/dL — ABNORMAL HIGH (ref 65–99)

## 2017-07-21 LAB — RAPID URINE DRUG SCREEN, HOSP PERFORMED
Amphetamines: NOT DETECTED
Barbiturates: NOT DETECTED
Benzodiazepines: NOT DETECTED
Cocaine: NOT DETECTED
OPIATES: NOT DETECTED
TETRAHYDROCANNABINOL: NOT DETECTED

## 2017-07-21 LAB — I-STAT CHEM 8, ED
BUN: 18 mg/dL (ref 6–20)
CALCIUM ION: 1.13 mmol/L — AB (ref 1.15–1.40)
CREATININE: 1 mg/dL (ref 0.61–1.24)
Chloride: 97 mmol/L — ABNORMAL LOW (ref 101–111)
Glucose, Bld: 131 mg/dL — ABNORMAL HIGH (ref 65–99)
HCT: 41 % (ref 39.0–52.0)
Hemoglobin: 13.9 g/dL (ref 13.0–17.0)
Potassium: 4.4 mmol/L (ref 3.5–5.1)
Sodium: 133 mmol/L — ABNORMAL LOW (ref 135–145)
TCO2: 25 mmol/L (ref 22–32)

## 2017-07-21 LAB — PROTIME-INR
INR: 1.1
Prothrombin Time: 14.1 seconds (ref 11.4–15.2)

## 2017-07-21 LAB — COMPREHENSIVE METABOLIC PANEL
ALBUMIN: 3.8 g/dL (ref 3.5–5.0)
ALK PHOS: 52 U/L (ref 38–126)
ALT: 20 U/L (ref 17–63)
ANION GAP: 9 (ref 5–15)
AST: 23 U/L (ref 15–41)
BUN: 15 mg/dL (ref 6–20)
CALCIUM: 9 mg/dL (ref 8.9–10.3)
CO2: 22 mmol/L (ref 22–32)
CREATININE: 1.05 mg/dL (ref 0.61–1.24)
Chloride: 100 mmol/L — ABNORMAL LOW (ref 101–111)
GFR calc non Af Amer: >60 mL/min (ref >60)
GLUCOSE: 134 mg/dL — AB (ref 65–99)
Potassium: 4.4 mmol/L (ref 3.5–5.1)
Sodium: 131 mmol/L — ABNORMAL LOW (ref 135–145)
TOTAL PROTEIN: 7.2 g/dL (ref 6.5–8.1)
Total Bilirubin: 0.7 mg/dL (ref 0.3–1.2)

## 2017-07-21 LAB — APTT: aPTT: 33 seconds (ref 24–36)

## 2017-07-21 LAB — ETHANOL: Alcohol, Ethyl (B): <10 mg/dL (ref <10)

## 2017-07-21 NOTE — ED Triage Notes (Addendum)
Patient arrived via EMS. Presents with stroke like symptoms. Patient has had a history of stroke. Wife states patient practices his speech daily-related to previous stroke; PT uses walker at baseline, Family at bedside.  EDP at bedside

## 2017-07-21 NOTE — Consult Note (Signed)
Referring Physician: Dr. Gilford Raid    Chief Complaint: Worsened dysphasia  HPI: Jose Beck is an 72 y.o. 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 Per ED PA's note, "Suddenly he was mixing up words in a sentence that he would normally be able to read.  He was unable to find words that he normally uses such as asking his wife for his cane and asking her to call EMS.  He seemed to improve by the time EMS arrived at 9:30 AM however on the ride over symptoms again worsened and he was having difficulty finding words."     He also has residual right sided weakness. Code Stroke was called in the ED. Initial NIHSS was 4, with mild right facial weakness, right arm drift, mild dysarthria and expressive aphasia on exam.  Patient was able to name and read NIHSS cards on exam.  CT showed multiple old strokes, with no new lesion. After CT, his wife stated that she feels he is back to his baseline.    At baseline, he uses a cane for walking, has mild dysarthria and mild word finding difficulty.  PMHx includes aortic stenosis, chronic combined systolic and diastolic CHF, CAD, DM, hyperlipidemia, HTN and prior stroke.  LSN: [id number] tPA Given: No: Symptoms resolved  Past Medical History:  Diagnosis Date  . Aortic stenosis    a. 11/2015 Echo: EF 55-60%, Gr1 DD, mild AS.  Marland Kitchen Chronic combined systolic and diastolic CHF (congestive heart failure) ([place])    a. 07/2015: TEE showing EF of 40% b. 11/2015: Echo w/ EF 55-60%, Grade 1 DD, mild AS.  Marland Kitchen Coronary artery disease    a. patient reports 100% stenosis of LAD and RCA by cath in 1999. b. 11/2015 Cath: LM nl, LAD 100 - fills by L->L collats from D1, LCX 60d, OM1 lalrge/nl, OM2 small/nl, RCA 192m, L->R collats.EF 35-45% (55-60% by echo).  . Diabetes mellitus without  complication ([place])   . GERD (gastroesophageal reflux disease)   . Hyperlipemia   . Hypertension   . Hypertensive heart disease   . Stroke [facility])    a. 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 Thrombocytopenia ([name]) 11/2015    Past Surgical History:  Procedure Laterality Date  . CARDIAC CATHETERIZATION    . CARDIAC CATHETERIZATION N/A 11/16/2015   Procedure: Left Heart Cath and Coronary Angiography;  Surgeon: [name], MD;  Location: [facility];  Service: Cardiovascular;  Laterality: N/A;  . MOLE REMOVAL    . TONSILLECTOMY      Family History  Problem Relation Age of Onset  . Hypertension Mother   . Heart disease Mother    Social History:  reports that  has never smoked. he has never used smokeless tobacco. He reports that he drinks about 0.6 oz of alcohol per week. He reports that he does not use drugs.  Allergies:  Allergies  Allergen Reactions  . Cheese Nausea And Vomiting  . Pravastatin     Anxiety attack    Home Medications:    ROS: No new weakness, headache, vision changes, sensory loss. Other ROS as per HPI.   Physical Examination: Blood pressure (!) 182/80, pulse 72, temperature 98.3 F (36.8 C), temperature source Oral, resp. rate 18, height 5\' 6"  (1.676 m), weight 104.3 kg (230  lb), SpO2 99 %.  HEENT: [place]/AT Lungs: Respirations unlabored Ext: Warm and well perfused  Neurologic Examination: Mental Status:  Alert, oriented, thought content appropriate.  Dysarthria noted. Speech is hesitant with mild expressive aphasia. Comprehension and naming are intact. Able to follow 2 step directional command without difficulty. Cranial Nerves: II:  Visual fields intact bilaterally. PERRL.  III,IV, VI: Ptosis not present, EOMI without nystagmus V,VII: Smile symmetric, facial temp sensation normal bilaterally VIII: hearing intact to conversation IX,X: No hypophonia XI: Head at midline XII: midline tongue extension  Motor: Right : Upper extremity    4+/5    Left:     Upper extremity   5/5  Lower extremity   4+/5    Lower extremity   5/5 Normal tone throughout; no atrophy noted Sensory: Temp and light touch intact x 4 without extinction Deep Tendon Reflexes:  3+ right brachioradialis and biceps 2+ left brachioradialis and biceps 2+ patellae bilaterally 1+ achilles bilaterally Toes downgoing Cerebellar: Dystaxic RUE FNF Gait: Deferred due to acuity of presentation  Results for orders placed or performed during the hospital encounter of 07/21/17 (from the past 48 hour(s))  CBG monitoring, ED     Status: Abnormal   Collection Time: 07/21/17 10:07 AM  Result Value Ref Range   Glucose-Capillary 122 (H) 65 - 99 mg/dL  I-stat troponin, ED     Status: None   Collection Time: 07/21/17 10:24 AM  Result Value Ref Range   Troponin i, poc 0.01 0.00 - 0.08 ng/mL   Comment 3            Comment: Due to the release kinetics of cTnI, a negative result within the first hours of the onset of symptoms does not rule out myocardial infarction with certainty. If myocardial infarction is still suspected, repeat the test at appropriate intervals.   Protime-INR     Status: None   Collection Time: 07/21/17 10:25 AM  Result Value Ref Range   Prothrombin Time 14.1 11.4 - 15.2 seconds   INR 1.10     Comment: Performed at [facility] [address], [place], [place] [address]  APTT     Status: None   Collection Time: 07/21/17 10:25 AM  Result Value Ref Range   aPTT 33 24 - 36 seconds    Comment: Performed at [facility] [address], [place], [place] [address]  CBC     Status: Abnormal (Preliminary result)   Collection Time: 07/21/17 10:25 AM  Result Value Ref Range   WBC 3.6 (L) 4.0 - 10.5 K/uL   RBC 4.10 (L) 4.22 - 5.81 MIL/uL   Hemoglobin 13.5 13.0 - 17.0 g/dL   HCT 39.8 39.0 - 52.0 %   MCV 97.1 78.0 - 100.0 fL   MCH 32.9 26.0 - 34.0 pg   MCHC 33.9 30.0 - 36.0 g/dL   RDW 12.9 11.5 - 15.5 %    Comment: Performed at [facility], [place] [address], [place], [place] [address]   Platelets PENDING 150 - 400 K/uL  Differential     Status: Abnormal   Collection Time: 07/21/17 10:25 AM  Result Value Ref Range   Neutrophils Relative % 70 %   Neutro Abs 2.6 1.7 - 7.7 K/uL   Lymphocytes Relative 16 %   Lymphs Abs 0.6 (L) 0.7 - 4.0 K/uL   Monocytes Relative 9 %   Monocytes Absolute 0.3 0.1 - 1.0 K/uL   Eosinophils Relative 4 %  Eosinophils Absolute 0.1 0.0 - 0.7 K/uL   Basophils Relative 1 %   Basophils Absolute 0.0 0.0 - 0.1 K/uL    Comment: Performed at [facility], [place] [address] [place], [place] [address]  I-Stat Chem 8, ED     Status: Abnormal   Collection Time: 07/21/17 10:26 AM  Result Value Ref Range   Sodium 133 (L) 135 - 145 mmol/L   Potassium 4.4 3.5 - 5.1 mmol/L   Chloride 97 (L) 101 - 111 mmol/L   BUN 18 6 - 20 mg/dL   Creatinine, Ser 1.00 0.61 - 1.24 mg/dL   Glucose, Bld 131 (H) 65 - 99 mg/dL   Calcium, Ion 1.13 (L) 1.15 - 1.40 mmol/L   TCO2 25 22 - 32 mmol/L   Hemoglobin 13.9 13.0 - 17.0 g/dL   HCT 41.0 39.0 - 52.0 %   Ct Head Code Stroke Wo Contrast  Result Date: 07/21/2017 CLINICAL DATA:  Code stroke. Code stroke. Slurred speech. Acute onset of speech disturbance 0800 hours EXAM: CT HEAD WITHOUT CONTRAST TECHNIQUE: Contiguous axial images were obtained from the base of the skull through the vertex without intravenous contrast. COMPARISON:  None. FINDINGS: Brain: No acute finding by CT. Generalized brain atrophy. Old infarction within the cerebellum, more extensive on the right than the left. Chronic small-vessel changes of the pons. Old appearing infarction at the inferior basal ganglia on the left. Old infarction left thalamus. Old infarction central left basal ganglia. Chronic small-vessel changes of the hemispheric white matter. Vascular: There is atherosclerotic calcification of the major vessels at the base of the brain. Skull: Normal Sinuses/Orbits: Mild seasonal mucosal  thickening.  Orbits negative. Other: None ASPECTS ([place] Stroke Program Early CT Score) - Ganglionic level infarction (caudate, lentiform nuclei, internal capsule, insula, M1-M3 cortex): 7 - Supraganglionic infarction (M4-M6 cortex): 3 Total score (0-10 with 10 being normal): 10 IMPRESSION: 1. No acute finding by CT. Extensive chronic ischemic changes throughout the brain as outlined above. 2. ASPECTS is 10. 3. These results were communicated to at 10:34 amon 3/12/2019by text page via the [facility] messaging system. Electronically Signed   By: Nelson Chimes M.D.   On: 07/21/2017 10:35    Assessment: 72 y.o. male presenting with transiently worsening of baseline dysphasia, now resolved 1. CT head with no acute abnormality. Chronic small vessel ischemic changes and old right cerebellar stroke are noted.  2. EKG: NSR 3. Stroke Risk Factors - chronic combined systolic and diastolic CHF, CAD, DM, hyperlipidemia, HTN and prior stroke  Plan: 1. HgbA1c, fasting lipid panel 2. MRI, MRA of the brain without contrast 3. PT consult, OT consult, Speech consult 4. Echocardiogram 5. Carotid dopplers 6. Continue Plavix and atorvastatin. Consider addition of ASA if indicated following stroke work up 7. Risk factor modification 8. Telemetry monitoring 9. Frequent neuro checks 10. Permissive HTN x 24 hours   @Electronically  signed: Dr. Kerney Elbe  07/21/2017, 11:03 AM

## 2017-07-21 NOTE — ED Notes (Signed)
Arrived at CT.

## 2017-07-21 NOTE — ED Provider Notes (Signed)
[facility]ERGENCY DEPARTMENT Provider Note   CSN: [id number] Arrival date & time: 07/21/17  2774     History   Chief Complaint Chief Complaint  Patient presents with  . Stroke Symptoms    HPI Trueman Worlds is a 72 y.o. male who is brought in by EMS for sudden onset speech changes.  These changes occurred at 8 AM this morning and I have initiated a code stroke.  He has a past medical history of diabetes, hypertension, hyperlipidemia, CAD, and history of acute CVA in 2017.  He has a mild right-sided weakness and uses a cane for walking.  The patient has mild dysarthria and mild word finding difficulty.  This morning at 8 AM the patient was practicing his speech therapy.  Suddenly he was mixing up words in a sentence that he would normally be able to read.  He was unable to find words that he normally uses such as asking his wife or his cane and asking her to call EMS.  He seemed to improve by the time EMS arrived at 9:30 AM however on the ride over symptoms again worsened and he was having difficulty finding words.  The patient denies any other neurologic symptoms including headache, new weakness or sensation change, headache, changes in vision.  He is able to ambulate.  HPI  Past Medical History:  Diagnosis Date  . Aortic stenosis    a. 11/2015 Echo: EF 55-60%, Gr1 DD, mild AS.  Marland Kitchen Chronic combined systolic and diastolic CHF (congestive heart failure) ([place])    a. 07/2015: TEE showing EF of 40% b. 11/2015: Echo w/ EF 55-60%, Grade 1 DD, mild AS.  Marland Kitchen Coronary artery disease    a. patient reports 100% stenosis of LAD and RCA by cath in 1999. b. 11/2015 Cath: LM nl, LAD 100 - fills by L->L collats from D1, LCX 60d, OM1 lalrge/nl, OM2 small/nl, RCA 111m, L->R collats.EF 35-45% (55-60% by echo).  . Diabetes mellitus without complication ([place])   . GERD (gastroesophageal reflux disease)   . Hyperlipemia   . Hypertension   . Hypertensive heart disease   . Stroke [facility])     a. 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 Thrombocytopenia ([place]) 11/2015    Patient Active Problem List   Diagnosis Date Noted  . History of stroke 01/15/2017  . Hyperlipidemia LDL goal <70 04/18/2016  . Hypertension   . Hypertensive heart disease   . Chest pain 11/16/2015  . Type 2 diabetes mellitus with circulatory disorder, with long-term current use of insulin ([place]) 11/16/2015  . Essential hypertension 11/16/2015  . Thrombocytopenia ([place]) 11/16/2015  . Leukopenia 11/16/2015  . Hyponatremia 11/16/2015  . Chronic combined systolic and diastolic CHF (congestive heart failure) ([place]) 11/16/2015  . CAD in native artery   . Brainstem infarct, acute 09/17/2015  . 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 Obesity 09/17/2015  . 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 Gait disturbance, 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  . History of CVA (cerebrovascular accident) 08/09/2015    Past Surgical History:  Procedure Laterality Date  . CARDIAC CATHETERIZATION    . CARDIAC CATHETERIZATION N/A 11/16/2015   Procedure: Left Heart Cath and Coronary Angiography;  Surgeon: [name], MD;  Location: [facility];  Service: Cardiovascular;  Laterality: N/A;  . MOLE REMOVAL    . TONSILLECTOMY         Home Medications    Prior to Admission medications  Medication Sig Start Date End Date Taking? Authorizing Provider  atorvastatin (LIPITOR) 80 MG tablet Take 1 tablet (80 mg total) by mouth daily at 6 PM. 04/25/16   Martinique, Peter M, MD  carvedilol (COREG) 25 MG tablet Take 1 tablet (25 mg total) by mouth 2 (two) times daily. 04/18/16 05/11/17  Martinique, Peter M, MD  clopidogrel (PLAVIX) 75 MG tablet Take 1 tablet (75 mg total) by mouth daily. 08/17/15   Angiulli, [name], PA-C  insulin detemir (LEVEMIR) 100 UNIT/ML injection Inject 0.22 mLs (22 Units total) into the skin 2 (two) times daily. 08/17/15   Angiulli, [name], PA-C  losartan (COZAAR) 50 MG tablet  Take 1 tablet (50 mg total) by mouth daily. 01/01/16   Martinique, Peter M, MD  nitroGLYCERIN (NITROSTAT) 0.4 MG SL tablet Place 0.4 mg under the tongue every 5 (five) minutes as needed for chest pain (x 3 doses).    [provider]    Family History Family History  Problem Relation Age of Onset  . Hypertension Mother   . Heart disease Mother     Social History Social History   Tobacco Use  . Smoking status: Never Smoker  . Smokeless tobacco: Never Used  Substance Use Topics  . Alcohol use: Yes    Alcohol/week: 0.6 oz    Types: 1 Shots of liquor per week    Comment: cocktails every day  . Drug use: No     Allergies   Cheese and Pravastatin   Review of Systems Review of Systems  Ten systems reviewed and are negative for acute change, except as noted in the HPI.   Physical Exam Updated Vital Signs There were no vitals taken for this visit.  Physical Exam  Constitutional: He is oriented to person, place, and time. He appears well-developed and well-nourished. No distress.  HENT:  Head: Normocephalic and atraumatic.  Eyes: Conjunctivae are normal. No scleral icterus.  Neck: Normal range of motion. Neck supple.  Cardiovascular: Normal rate, regular rhythm and normal heart sounds.  Pulmonary/Chest: Effort normal and breath sounds normal. No respiratory distress.  Abdominal: Soft. There is no tenderness.  Musculoskeletal: He exhibits no edema.  Neurological: He is alert and oriented to person, place, and time.  Speech is Dysarthric patient is having some word finding difficulty outside of his baseline. Follows commands. Major Cranial nerves without deficit, no facial droop 5/5 strength on the Left, 4/5 strength on the R in the upper and lower extremities bilaterally including dorsiflexion and plantar flexion, strong and equal grip strength Sensation normal to light and sharp touch Moves extremities without ataxia, coordination intact Normal finger to nose and rapid  alternating movements Neg romberg, no pronator drift Normal gait Normal heel-shin and balance   Skin: Skin is warm and dry. He is not diaphoretic.  Psychiatric: His behavior is normal.  Nursing note and vitals reviewed.    ED Treatments / Results  Labs (all labs ordered are listed, but only abnormal results are displayed) Labs Reviewed  CBG MONITORING, ED - Abnormal; Notable for the following components:      Result Value   Glucose-Capillary 122 (*)    All other components within normal limits    EKG  EKG Interpretation None       Radiology No results found.  Procedures Procedures (including critical care time)  Medications Ordered in ED Medications - No data to display   Initial Impression / Assessment and Plan / ED Course  I have reviewed the triage  vital signs and the nursing notes.  Pertinent labs & imaging results that were available during my care of the patient were reviewed by me and considered in my medical decision making (see chart for details).  Clinical Course as of Jul 21 1600  Tue Jul 21, 2017  1156 Patient CT scan negative.  I have spoken with Dr. Cheral Marker who asked that we perform MRI/MRA.  I placed orders on the patient.  [AH]  1242 Platelets: (!) 74 [AH]    Clinical Course User Index [AH] Margarita Mail, PA-C    Patient has been seen and assessed by the stroke team.  His labs and workup including MRI/MRA of the brain are negative for acute abnormality.  Patient does not appear to have an acute stroke or emergent cause of his symptoms today and appears appropriate for discharge.  Patient is back to baseline.  I discussed return precautions with him and his wife.  Final Clinical Impressions(s) / ED Diagnoses   Final diagnoses:  Transient alteration of awareness    ED Discharge Orders    None       Margarita Mail, PA-C 07/21/17 1603    Isla Pence, MD 07/21/17 1611

## 2017-07-21 NOTE — ED Notes (Signed)
Neurologist at bedside, Stroke team at bedside, Pharmacist at bedside

## 2017-07-21 NOTE — ED Notes (Signed)
Patient transported to MRI 

## 2017-07-21 NOTE — Discharge Instructions (Signed)
Your MRI and work up were negative today. You do not seem to have had a stroke or other emergent cause of your symptoms today. Please follow up with your primary care doctor as soon as possible.

## 2017-07-21 NOTE — Code Documentation (Signed)
72yo male arriving to [facility] via [place] at [phone].  Patient from home where he was practicing his sentences at 0800 and was noted to have word substitution.  Of note, patient with h/o stroke in 2017 with residual expressive aphasia and right sided weakness.  Code stroke called in the ED.  Stroke team to CT.  NIHSS 4, see documentation for details and code stroke times.  Patient with mild right facial weakness, right arm drift, mild dysarthria and expressive aphasia on exam.  Patient able to name and read NIHSS cards on exam.  Patient back to C22.  Wife at bedside and patient read sentences for her, she feels he is back to his baseline at this point.  Dr. Cheral Marker at the bedside.  No acute stroke treatment at this time.  TIA alert.  Bedside handoff with ED RN Tawanna Solo.

## 2017-07-27 DIAGNOSIS — R404 Transient alteration of awareness: Secondary | ICD-10-CM | POA: Diagnosis not present

## 2017-07-27 DIAGNOSIS — Z6833 Body mass index (BMI) 33.0-33.9, adult: Secondary | ICD-10-CM | POA: Diagnosis not present

## 2017-08-13 ENCOUNTER — Other Ambulatory Visit: Payer: Medicare Other

## 2017-08-13 ENCOUNTER — Inpatient Hospital Stay: Payer: Medicare Other | Attending: Hematology

## 2017-08-13 DIAGNOSIS — D696 Thrombocytopenia, unspecified: Secondary | ICD-10-CM

## 2017-08-13 LAB — CBC WITH DIFFERENTIAL/PLATELET
Basophils Absolute: 0 10*3/uL (ref 0.0–0.1)
Basophils Relative: 0 %
Eosinophils Absolute: 0.1 10*3/uL (ref 0.0–0.5)
Eosinophils Relative: 3 %
HEMATOCRIT: 39.4 % (ref 38.4–49.9)
HEMOGLOBIN: 13.6 g/dL (ref 13.0–17.1)
LYMPHS ABS: 0.8 10*3/uL — AB (ref 0.9–3.3)
LYMPHS PCT: 22 %
MCH: 33.5 pg — ABNORMAL HIGH (ref 27.2–33.4)
MCHC: 34.5 g/dL (ref 32.0–36.0)
MCV: 97 fL (ref 79.3–98.0)
MONO ABS: 0.4 10*3/uL (ref 0.1–0.9)
MONOS PCT: 12 %
NEUTROS ABS: 2.3 10*3/uL (ref 1.5–6.5)
NEUTROS PCT: 63 %
Platelets: 83 10*3/uL — ABNORMAL LOW (ref 140–400)
RBC: 4.06 MIL/uL — ABNORMAL LOW (ref 4.20–5.82)
RDW: 13.2 % (ref 11.0–14.6)
WBC: 3.6 10*3/uL — ABNORMAL LOW (ref 4.0–10.3)

## 2017-08-14 ENCOUNTER — Telehealth: Payer: Self-pay

## 2017-08-14 ENCOUNTER — Other Ambulatory Visit: Payer: Self-pay

## 2017-08-14 DIAGNOSIS — I713 Abdominal aortic aneurysm, ruptured, unspecified: Secondary | ICD-10-CM

## 2017-08-14 LAB — HEPATITIS B SURFACE ANTIGEN: HEP B S AG: NEGATIVE

## 2017-08-14 LAB — HEPATITIS C ANTIBODY: HCV Ab: <0.1 s/co ratio (ref 0.0–0.9)

## 2017-08-14 NOTE — Telephone Encounter (Signed)
-----   Message from Truitt Merle, MD sent at 08/14/2017 12:47 PM EDT ----- Please let pt know that his mild thrombocytopenia is stable, HBV and HCV tests were negative, thanks   Truitt Merle  08/14/2017

## 2017-08-14 NOTE — Telephone Encounter (Signed)
Called and left below message. Instructed to cal if her had questions.

## 2017-09-29 ENCOUNTER — Encounter: Payer: Self-pay | Admitting: Cardiology

## 2017-10-07 DIAGNOSIS — I10 Essential (primary) hypertension: Secondary | ICD-10-CM | POA: Diagnosis not present

## 2017-10-07 DIAGNOSIS — E1151 Type 2 diabetes mellitus with diabetic peripheral angiopathy without gangrene: Secondary | ICD-10-CM | POA: Diagnosis not present

## 2017-10-07 DIAGNOSIS — I2510 Atherosclerotic heart disease of native coronary artery without angina pectoris: Secondary | ICD-10-CM | POA: Diagnosis not present

## 2017-10-07 DIAGNOSIS — Z6834 Body mass index (BMI) 34.0-34.9, adult: Secondary | ICD-10-CM | POA: Diagnosis not present

## 2017-10-07 DIAGNOSIS — E669 Obesity, unspecified: Secondary | ICD-10-CM | POA: Diagnosis not present

## 2017-10-07 DIAGNOSIS — I69959 Hemiplegia and hemiparesis following unspecified cerebrovascular disease affecting unspecified side: Secondary | ICD-10-CM | POA: Diagnosis not present

## 2017-10-14 ENCOUNTER — Other Ambulatory Visit: Payer: Self-pay

## 2017-10-14 DIAGNOSIS — I63219 Cerebral infarction due to unspecified occlusion or stenosis of unspecified vertebral arteries: Secondary | ICD-10-CM

## 2017-10-14 DIAGNOSIS — I6523 Occlusion and stenosis of bilateral carotid arteries: Secondary | ICD-10-CM

## 2017-10-14 DIAGNOSIS — Z8673 Personal history of transient ischemic attack (TIA), and cerebral infarction without residual deficits: Secondary | ICD-10-CM

## 2017-10-19 NOTE — Progress Notes (Signed)
Cardiology Office Note    Date:  10/21/2017   ID:  Mitsuru, Dault 01-18-1946, MRN [id number]  PCP:  [name], MD  Cardiologist: Dr. Martinique   Chief Complaint  Patient presents with  . Coronary Artery Disease    History of Present Illness:    Jose Beck is a 72 y.o. male with past medical history of CAD (s/p known occlusion of LAD and RCA by cath in 1992 with cath in 2017 showing similar findings with collateral flow noted), chronic combined systolic and diastolic CHF, HTN, HLD, Type 2 DM, GERD, and prior CVA.  He had a fall with vertebral fracture in December managed conservatively. He had some increased garbled speech in March that resolved. MRI showed multiple chronic strokes but no acute changes. Prior  MRI demonstrated a 4.6 cm AAA. Seen by Dr. Trula Slade and US showed 3.6 cm AAA. Plans to have CTA done this summer for follow up.    On follow up he denies any chest pain or dyspnea. No palpitations. Functionally limited by prior strokes and walks with walker. BP at home documented in a diary is well controlled with readings typically 120-130/80.    Past Medical History:  Diagnosis Date  . Aortic stenosis    a. 11/2015 Echo: EF 55-60%, Gr1 DD, mild AS.  Marland Kitchen Chronic combined systolic and diastolic CHF (congestive heart failure) ([place])    a. 07/2015: TEE showing EF of 40% b. 11/2015: Echo w/ EF 55-60%, Grade 1 DD, mild AS.  Marland Kitchen Coronary artery disease    a. patient reports 100% stenosis of LAD and RCA by cath in 1999. b. 11/2015 Cath: LM nl, LAD 100 - fills by L->L collats from D1, LCX 60d, OM1 lalrge/nl, OM2 small/nl, RCA 148m, L->R collats.EF 35-45% (55-60% by echo).  . Diabetes mellitus without complication ([place])   . GERD (gastroesophageal reflux disease)   . Hyperlipemia   . Hypertension   . Hypertensive heart disease   . Stroke [facility])    a. 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 Thrombocytopenia ([place]) 11/2015    Past Surgical History:    Procedure Laterality Date  . CARDIAC CATHETERIZATION    . CARDIAC CATHETERIZATION N/A 11/16/2015   Procedure: Left Heart Cath and Coronary Angiography;  Surgeon: [name], MD;  Location: [facility];  Service: Cardiovascular;  Laterality: N/A;  . MOLE REMOVAL    . TONSILLECTOMY      Current Medications: Outpatient Medications Prior to Visit  Medication Sig Dispense Refill  . atorvastatin (LIPITOR) 80 MG tablet Take 1 tablet (80 mg total) by mouth daily at 6 PM. 30 tablet 6  . carvedilol (COREG) 25 MG tablet Take 1 tablet (25 mg total) by mouth 2 (two) times daily. 60 tablet 1  . clopidogrel (PLAVIX) 75 MG tablet Take 1 tablet (75 mg total) by mouth daily. 30 tablet 1  . insulin detemir (LEVEMIR) 100 UNIT/ML injection Inject into the skin as directed.    Marland Kitchen losartan (COZAAR) 50 MG tablet Take 1 tablet (50 mg total) by mouth daily. 90 tablet 2  . nitroGLYCERIN (NITROSTAT) 0.4 MG SL tablet Place 0.4 mg under the tongue every 5 (five) minutes as needed for chest pain (x 3 doses).    . insulin detemir (LEVEMIR) 100 UNIT/ML injection Inject 0.22 mLs (22 Units total) into the skin 2 (two) times daily. (Patient not taking: Reported on 10/21/2017) 10 mL 11   No facility-administered medications prior to  visit.      Allergies:   Cheese and Pravastatin   Social History   Socioeconomic History  . Marital status: Married    Spouse name: [name]  . Number of children: 0  . Years of education: College  . Highest education level: Not on file  Occupational History  . Occupation: Retired   Scientific laboratory technician  . Financial resource strain: Not on file  . Food insecurity:    Worry: Not on file    Inability: Not on file  . Transportation needs:    Medical: Not on file    Non-medical: Not on file  Tobacco Use  . Smoking status: Never Smoker  . Smokeless tobacco: Never Used  Substance and Sexual Activity  . Alcohol use: Yes    Alcohol/week: 0.6 oz    Types: 1 Shots of liquor per week     Comment: cocktails every day  . Drug use: No  . Sexual activity: Not on file  Lifestyle  . Physical activity:    Days per week: Not on file    Minutes per session: Not on file  . Stress: Not on file  Relationships  . Social connections:    Talks on phone: Not on file    Gets together: Not on file    Attends religious service: Not on file    Active member of club or organization: Not on file    Attends meetings of clubs or organizations: Not on file    Relationship status: Not on file  Other Topics Concern  . Not on file  Social History Narrative   Drinks coffee daily      Family History:  The patient's family history includes Heart disease in his mother; Hypertension in his mother.   Review of Systems:   Please see the history of present illness.     All other systems reviewed and are otherwise negative except as noted above.   Physical Exam:    VS:  BP (!) 148/86   Pulse 71   Ht 5\' 6"  (1.676 m)   Wt 236 lb (107 kg)   SpO2 98%   BMI 38.09 kg/m    GENERAL:  Well appearing obese WM in NAD HEENT:  PERRL, EOMI, sclera are clear. Oropharynx is clear. NECK:  No jugular venous distention, carotid upstroke brisk and symmetric, no bruits, no thyromegaly or adenopathy LUNGS:  Clear to auscultation bilaterally CHEST:  Unremarkable HEART:  RRR,  PMI not displaced or sustained,S1 and S2 within normal limits, no S3, no S4: no clicks, no rubs, no murmurs ABD:  Soft, nontender. BS +, no masses or bruits. No hepatomegaly, no splenomegaly EXT:  2 + pulses throughout, no edema, no cyanosis no clubbing SKIN:  Warm and dry.  No rashes NEURO:  Alert and oriented x 3. Cranial nerves II through XII intact. Right Leg weakness and uses walker. PSYCH:  Cognitively intact     Wt Readings from Last 3 Encounters:  10/21/17 236 lb (107 kg)  07/21/17 230 lb (104.3 kg)  05/11/17 232 lb 14.4 oz (105.6 kg)     Studies/Labs Reviewed:   EKG:  EKG is not ordered today.   Recent  Labs: 07/21/2017: ALT 20; BUN 18; Creatinine, Ser 1.00; Potassium 4.4; Sodium 133 08/13/2017: Hemoglobin 13.6; Platelets 83   Lipid Panel    Component Value Date/Time   CHOL 151 11/16/2015 0702   TRIG 57 11/16/2015 0702   HDL 46 11/16/2015 0702   CHOLHDL 3.3 11/16/2015 9518  VLDL 11 11/16/2015 0702   LDLCALC 94 11/16/2015 0702   Labs dated 01/21/17: cholesterol 144, triglycerides 69, HDL 54, LDL 76. Dated 10/07/17: A1c 5.2%.   Additional studies/ records that were reviewed today include:   Cardiac Catheterization: 11/2015 1. Mid RCA lesion, 100% stenosed. 2. Ost 1st Diag to 1st Diag lesion, 70% stenosed. 3. Mid LAD lesion, 100% stenosed. 4. Dist RCA lesion, 100% stenosed. 5. Dist Cx lesion, 60% stenosed.    Total occlusion of the proximal RCA. RCA is heavily calcified. RCA fills by collaterals from the circumflex coronary of the left system. The right coronary is large in distribution.  Total occlusion of the proximal to mid LAD after the origin of the first of perforator and first diagonal. Left to left collateral supply the LAD and a second diagonal. The first diagonal contains eccentric 50-70% narrowing.  Widely patent circumflex including a small ramus intermedius/first obtuse marginal, and a large branching second obtuse marginal. The distal circumflex beyond the second marginal is small and contains 50-70% narrowing. The circumflex is the source of collaterals to the distal right coronary.  Mildly depressed LV function with estimated EF in the 35 to 40% range. LVEDP is mildly elevated. The inferior wall is hypokinetic.   RECOMMENDATIONS:  Images were reviewed with Dr. Martinique. The plan at this time is to continue medical therapy unless symptoms progress.  Aggressive risk factor modification.  Heart failure therapy as indicated.  Echocardiogram: 11/2015 Study Conclusions  - Left ventricle: The cavity size was normal. Systolic function was   normal. The estimated  ejection fraction was in the range of 55%   to 60%. Wall motion was normal; there were no regional wall   motion abnormalities. There was an increased relative   contribution of atrial contraction to ventricular filling.   Doppler parameters are consistent with abnormal left ventricular   relaxation (grade 1 diastolic dysfunction). - Aortic valve: Moderately calcified annulus. Trileaflet. Moderate   diffuse thickening and calcification. There was mild stenosis.   Valve area (VTI): 2.17 cm^2. Valve area (Vmax): 1.88 cm^2. Valve   area (Vmean): 1.75 cm^2.   Assessment:    1. Coronary artery disease involving native coronary artery of native heart without angina pectoris   2. AAA (abdominal aortic aneurysm) without rupture ([place])   3. Essential hypertension   4. Hyperlipidemia LDL goal <70      Plan:   In order of problems listed above:  1. CAD - the patient has known occlusion of the LAD and RCA by cath in 1992 with repeat cath in 2017 showing similar findings with collateral flow noted.  - he denies any recent chest pain or dyspnea on exertion.  - continue Plavix, BB, and statin therapy.   2. Chronic Combined Systolic and Diastolic CHF - cath in 11/3708 showed a reduced EF of 35-40% but EF was preserved at 55-60% by echo at that time.  - he appears euvolemic.  - continue Coreg 25mg  BID and Losartan 50mg  daily.   3. HTN - BP elevated some today but home readings look good.   4. HLD - LDL 76.  -  Remains on high-dose Atorvastatin 80mg  daily.  5. Type 2 DM -  Followed by PCP. Hgb A1c was well-controlled at 5.2%  6. AAA -  diagnosed during a MRI following his back injury. 3.6 cm by [place]. For CT angiogram later this month.   Follow up in 6 months.    Signed, Peter Martinique, MD  10/21/2017 4:11 PM  [facility] [address], [place], [place] [address] Phone: [phone]

## 2017-10-21 ENCOUNTER — Encounter: Payer: Self-pay | Admitting: Cardiology

## 2017-10-21 ENCOUNTER — Ambulatory Visit (INDEPENDENT_AMBULATORY_CARE_PROVIDER_SITE_OTHER): Payer: Medicare Other | Admitting: Cardiology

## 2017-10-21 VITALS — BP 148/86 | HR 71 | Ht 66.0 in | Wt 236.0 lb

## 2017-10-21 DIAGNOSIS — I63219 Cerebral infarction due to unspecified occlusion or stenosis of unspecified vertebral arteries: Secondary | ICD-10-CM | POA: Diagnosis not present

## 2017-10-21 DIAGNOSIS — E785 Hyperlipidemia, unspecified: Secondary | ICD-10-CM

## 2017-10-21 DIAGNOSIS — I2510 Atherosclerotic heart disease of native coronary artery without angina pectoris: Secondary | ICD-10-CM

## 2017-10-21 DIAGNOSIS — I10 Essential (primary) hypertension: Secondary | ICD-10-CM | POA: Diagnosis not present

## 2017-10-21 DIAGNOSIS — I714 Abdominal aortic aneurysm, without rupture, unspecified: Secondary | ICD-10-CM

## 2017-10-21 NOTE — Patient Instructions (Addendum)
Continue your current therapy  We will await follow up CT  I will see you in 6 months

## 2017-11-02 ENCOUNTER — Telehealth: Payer: Self-pay | Admitting: Surgery

## 2017-11-02 ENCOUNTER — Ambulatory Visit
Admission: RE | Admit: 2017-11-02 | Discharge: 2017-11-02 | Disposition: A | Discharge status: Home / Self Care | Payer: Medicare Other | Source: Ambulatory Visit | Attending: Surgery | Admitting: Surgery

## 2017-11-02 ENCOUNTER — Encounter: Payer: Self-pay | Admitting: *Deleted

## 2017-11-02 ENCOUNTER — Ambulatory Visit (HOSPITAL_COMMUNITY)
Admission: RE | Admit: 2017-11-02 | Discharge: 2017-11-02 | Disposition: A | Discharge status: Home / Self Care | Payer: Medicare Other | Source: Ambulatory Visit | Attending: Surgery | Admitting: Surgery

## 2017-11-02 ENCOUNTER — Other Ambulatory Visit: Payer: Self-pay | Admitting: *Deleted

## 2017-11-02 ENCOUNTER — Ambulatory Visit (INDEPENDENT_AMBULATORY_CARE_PROVIDER_SITE_OTHER): Payer: Medicare Other | Admitting: Surgery

## 2017-11-02 ENCOUNTER — Telehealth: Payer: Self-pay | Admitting: *Deleted

## 2017-11-02 ENCOUNTER — Encounter: Payer: Self-pay | Admitting: Surgery

## 2017-11-02 VITALS — BP 162/88 | HR 70 | Temp 97.6°F | Ht 70.0 in | Wt 232.0 lb

## 2017-11-02 DIAGNOSIS — K449 Diaphragmatic hernia without obstruction or gangrene: Secondary | ICD-10-CM | POA: Diagnosis not present

## 2017-11-02 DIAGNOSIS — I63219 Cerebral infarction due to unspecified occlusion or stenosis of unspecified vertebral arteries: Secondary | ICD-10-CM | POA: Diagnosis not present

## 2017-11-02 DIAGNOSIS — I713 Abdominal aortic aneurysm, ruptured, unspecified: Secondary | ICD-10-CM

## 2017-11-02 DIAGNOSIS — I6523 Occlusion and stenosis of bilateral carotid arteries: Secondary | ICD-10-CM | POA: Diagnosis not present

## 2017-11-02 DIAGNOSIS — I700 Atherosclerosis of aorta: Secondary | ICD-10-CM | POA: Diagnosis not present

## 2017-11-02 DIAGNOSIS — I10 Essential (primary) hypertension: Secondary | ICD-10-CM | POA: Diagnosis not present

## 2017-11-02 DIAGNOSIS — I723 Aneurysm of iliac artery: Secondary | ICD-10-CM | POA: Diagnosis not present

## 2017-11-02 DIAGNOSIS — I714 Abdominal aortic aneurysm, without rupture, unspecified: Secondary | ICD-10-CM

## 2017-11-02 DIAGNOSIS — E119 Type 2 diabetes mellitus without complications: Secondary | ICD-10-CM | POA: Insufficient documentation

## 2017-11-02 DIAGNOSIS — Z8673 Personal history of transient ischemic attack (TIA), and cerebral infarction without residual deficits: Secondary | ICD-10-CM

## 2017-11-02 MED ORDER — IOPAMIDOL (ISOVUE-370) INJECTION 76%
100.0000 mL | Freq: Once | INTRAVENOUS | Status: AC | PRN
  Administered 2017-11-02: 100 mL via INTRAVENOUS

## 2017-11-02 NOTE — Telephone Encounter (Signed)
    [facility] Pre-operative Risk Assessment    Request for surgical clearance:  1. What type of surgery is being performed? EVAR   2. When is this surgery scheduled? 11/25/2017   3. What type of clearance is required (medical clearance vs. Pharmacy clearance to hold med vs. Both)? MEDICAL  4. Are there any medications that need to be held prior to surgery and how long?NONE NOTED   5. Practice name and name of physician performing surgery? DR Trula Slade   6. What is your office phone number [phone]    7.   What is your office fax number [phone]  8.   Anesthesia type (None, local, MAC, general) ? NOT LISTED   Jose Beck 11/02/2017, 4:49 PM  _________________________________________________________________   (provider comments below)

## 2017-11-02 NOTE — Progress Notes (Signed)
Vascular and Vein Specialist of [place]  Patient name: Jose Beck MRN: [id number] DOB: 22-Jul-1945 Sex: male   REASON FOR VISIT:    Follow up AAA  HISOTRY OF PRESENT ILLNESS:   Jose Beck is a 72 y.o. male, who is referred today for evaluation of an abdominal aortic aneurysm.  The patient recently had a fall.  He was seen by Dr. Rolena Infante who ultimately ordered a MRI which revealed a 4.6 cm aneurysm.  The patient is not having any abdominal pain.  This was an incidental finding.  He is without abdominal pain or back pain today.  Patient has a history of coronary artery disease.  He has known two-vessel occlusion with collaterals.  He has never had a stent or bypass.  He does have a history of stroke approximately a year and a half ago.  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  It occurred in [place].  The only imaging studies are a report from a CT scan that showed his carotid bifurcations were widely patent.  His residual deficits are some right-sided weakness and speech difficulties.  The patient is on statin therapy for hypercholesterolemia.  He is medically managed for hypertension with an ARB.  He is a non-smoker.  PAST MEDICAL HISTORY:   Past Medical History:  Diagnosis Date  . Aortic stenosis    a. 11/2015 Echo: EF 55-60%, Gr1 DD, mild AS.  Marland Kitchen Chronic combined systolic and diastolic CHF (congestive heart failure) ([place])    a. 07/2015: TEE showing EF of 40% b. 11/2015: Echo w/ EF 55-60%, Grade 1 DD, mild AS.  Marland Kitchen Coronary artery disease    a. patient reports 100% stenosis of LAD and RCA by cath in 1999. b. 11/2015 Cath: LM nl, LAD 100 - fills by L->L collats from D1, LCX 60d, OM1 lalrge/nl, OM2 small/nl, RCA 152m, L->R collats.EF 35-45% (55-60% by echo).  . Diabetes mellitus without complication ([place])   . GERD (gastroesophageal reflux disease)   . Hyperlipemia   . Hypertension   . Hypertensive heart disease   . Stroke  [facility])    a. 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 Thrombocytopenia ([place]) 11/2015     FAMILY HISTORY:   Family History  Problem Relation Age of Onset  . Hypertension Mother   . Heart disease Mother     SOCIAL HISTORY:   Social History   Tobacco Use  . Smoking status: Never Smoker  . Smokeless tobacco: Never Used  Substance Use Topics  . Alcohol use: Yes    Alcohol/week: 0.6 oz    Types: 1 Shots of liquor per week    Comment: cocktails every day     ALLERGIES:   Allergies  Allergen Reactions  . Cheese Nausea And Vomiting  . Pravastatin     Anxiety attack     CURRENT MEDICATIONS:   Current Outpatient Medications  Medication Sig Dispense Refill  . atorvastatin (LIPITOR) 80 MG tablet Take 1 tablet (80 mg total) by mouth daily at 6 PM. 30 tablet 6  . clopidogrel (PLAVIX) 75 MG tablet Take 1 tablet (75 mg total) by mouth daily. 30 tablet 1  . insulin detemir (LEVEMIR) 100 UNIT/ML injection Inject into the skin as directed.    Marland Kitchen losartan (COZAAR) 50 MG tablet Take 1 tablet (50 mg total) by mouth daily. 90 tablet 2  . nitroGLYCERIN (NITROSTAT) 0.4 MG SL tablet Place 0.4 mg under the tongue every 5 (five) minutes as needed for chest  pain (x 3 doses).    . carvedilol (COREG) 25 MG tablet Take 1 tablet (25 mg total) by mouth 2 (two) times daily. 60 tablet 1   No current facility-administered medications for this visit.     REVIEW OF SYSTEMS:   [X]  denotes positive finding, [ ]  denotes negative finding Cardiac  Comments:  Chest pain or chest pressure:    Shortness of breath upon exertion:    Short of breath when lying flat:    Irregular heart rhythm:        Vascular    Pain in calf, thigh, or hip brought on by ambulation:    Pain in feet at night that wakes you up from your sleep:     Blood clot in your veins:    Leg swelling:         Pulmonary    Oxygen at home:    Productive cough:     Wheezing:         Neurologic    Sudden weakness in arms or  legs:     Sudden numbness in arms or legs:     Sudden onset of difficulty speaking or slurred speech:    Temporary loss of vision in one eye:     Problems with dizziness:         Gastrointestinal    Blood in stool:     Vomited blood:         Genitourinary    Burning when urinating:     Blood in urine:        Psychiatric    Major depression:         Hematologic    Bleeding problems:    Problems with blood clotting too easily:        Skin    Rashes or ulcers:        Constitutional    Fever or chills:      PHYSICAL EXAM:   Vitals:   11/02/17 1140  BP: (!) 162/88  Pulse: 70  Temp: 97.6 F (36.4 C)  TempSrc: Oral  SpO2: 98%  Weight: 232 lb (105.2 kg)  Height: 5\' 10"  (1.778 m)    GENERAL: The patient is a well-nourished male, in no acute distress. The vital signs are documented above. CARDIAC: There is a regular rate and rhythm.  VASCULAR: Nonpalpable pedal pulses.  No carotid bruits. PULMONARY: Non-labored respirations ABDOMEN: Soft and non-tender with  MUSCULOSKELETAL: There are no major deformities or cyanosis. NEUROLOGIC: No focal weakness or paresthesias are detected. SKIN: There are no ulcers or rashes noted. PSYCHIATRIC: The patient has a normal affect.  STUDIES:   I have reviewed his CTA with the following findings:  1. Somewhat irregular/lobular aneurysmal dilatation of the infrarenal abdominal aorta with a maximal diameter of 5.7 cm. 2. 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 3. 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 4. Hepatic cirrhosis with evidence of portal hypertension (mild splenomegaly and recanalized paraumbilical vein). 5. Prior T12 compression fracture with vertebra plana appearance and associated exaggerated thoracolumbar kyphosis. 6. Additional ancillary findings as above.  Carotid duplex: 1-39% bilateral  stenosis  MEDICAL ISSUES:   AAA: I discussed the CT scan findings today with the patient.  At this size, I have recommended proceeding with repair.  I feel like he is a candidate for endovascular intervention.  We discussed the risks and benefits of  the procedure including the risk of stroke, heart attack, cardiopulmonary complications, renal injury, intestinal ischemia, arterial injury, and bleeding.  All of his questions have been answered.  His procedure has been scheduled for Wednesday, July 17.  He will need to stop his Plavix prior to his operation.  I will initiate aspirin 81 mg when he stops his Plavix.  He is scheduled to see hematology regarding his thrombocytopenia.  His last platelet count was 80,000.  I will also send him to get formal cardiology clearance to proceed with surgery    Annamarie Major, MD Vascular and Vein Specialists of [facility] [phone] Pager [phone]

## 2017-11-02 NOTE — H&P (View-Only) (Signed)
Vascular and Vein Specialist of [place]  Patient name: Jose Beck MRN: [id number] DOB: 1945/08/24 Sex: male   REASON FOR VISIT:    Follow up AAA  HISOTRY OF PRESENT ILLNESS:   Jose Beck is a 73 y.o. male, who is referred today for evaluation of an abdominal aortic aneurysm.  The patient recently had a fall.  He was seen by Dr. Rolena Infante who ultimately ordered a MRI which revealed a 4.6 cm aneurysm.  The patient is not having any abdominal pain.  This was an incidental finding.  He is without abdominal pain or back pain today.  Patient has a history of coronary artery disease.  He has known two-vessel occlusion with collaterals.  He has never had a stent or bypass.  He does have a history of stroke approximately a year and a half ago.  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  It occurred in [place].  The only imaging studies are a report from a CT scan that showed his carotid bifurcations were widely patent.  His residual deficits are some right-sided weakness and speech difficulties.  The patient is on statin therapy for hypercholesterolemia.  He is medically managed for hypertension with an ARB.  He is a non-smoker.  PAST MEDICAL HISTORY:   Past Medical History:  Diagnosis Date  . Aortic stenosis    a. 11/2015 Echo: EF 55-60%, Gr1 DD, mild AS.  Marland Kitchen Chronic combined systolic and diastolic CHF (congestive heart failure) ([place])    a. 07/2015: TEE showing EF of 40% b. 11/2015: Echo w/ EF 55-60%, Grade 1 DD, mild AS.  Marland Kitchen Coronary artery disease    a. patient reports 100% stenosis of LAD and RCA by cath in 1999. b. 11/2015 Cath: LM nl, LAD 100 - fills by L->L collats from D1, LCX 60d, OM1 lalrge/nl, OM2 small/nl, RCA 173m, L->R collats.EF 35-45% (55-60% by echo).  . Diabetes mellitus without complication ([place])   . GERD (gastroesophageal reflux disease)   . Hyperlipemia   . Hypertension   . Hypertensive heart disease   . Stroke  [facility])    a. 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 Thrombocytopenia ([place]) 11/2015     FAMILY HISTORY:   Family History  Problem Relation Age of Onset  . Hypertension Mother   . Heart disease Mother     SOCIAL HISTORY:   Social History   Tobacco Use  . Smoking status: Never Smoker  . Smokeless tobacco: Never Used  Substance Use Topics  . Alcohol use: Yes    Alcohol/week: 0.6 oz    Types: 1 Shots of liquor per week    Comment: cocktails every day     ALLERGIES:   Allergies  Allergen Reactions  . Cheese Nausea And Vomiting  . Pravastatin     Anxiety attack     CURRENT MEDICATIONS:   Current Outpatient Medications  Medication Sig Dispense Refill  . atorvastatin (LIPITOR) 80 MG tablet Take 1 tablet (80 mg total) by mouth daily at 6 PM. 30 tablet 6  . clopidogrel (PLAVIX) 75 MG tablet Take 1 tablet (75 mg total) by mouth daily. 30 tablet 1  . insulin detemir (LEVEMIR) 100 UNIT/ML injection Inject into the skin as directed.    Marland Kitchen losartan (COZAAR) 50 MG tablet Take 1 tablet (50 mg total) by mouth daily. 90 tablet 2  . nitroGLYCERIN (NITROSTAT) 0.4 MG SL tablet Place 0.4 mg under the tongue every 5 (five) minutes as needed for chest  pain (x 3 doses).    . carvedilol (COREG) 25 MG tablet Take 1 tablet (25 mg total) by mouth 2 (two) times daily. 60 tablet 1   No current facility-administered medications for this visit.     REVIEW OF SYSTEMS:   [X]  denotes positive finding, [ ]  denotes negative finding Cardiac  Comments:  Chest pain or chest pressure:    Shortness of breath upon exertion:    Short of breath when lying flat:    Irregular heart rhythm:        Vascular    Pain in calf, thigh, or hip brought on by ambulation:    Pain in feet at night that wakes you up from your sleep:     Blood clot in your veins:    Leg swelling:         Pulmonary    Oxygen at home:    Productive cough:     Wheezing:         Neurologic    Sudden weakness in arms or  legs:     Sudden numbness in arms or legs:     Sudden onset of difficulty speaking or slurred speech:    Temporary loss of vision in one eye:     Problems with dizziness:         Gastrointestinal    Blood in stool:     Vomited blood:         Genitourinary    Burning when urinating:     Blood in urine:        Psychiatric    Major depression:         Hematologic    Bleeding problems:    Problems with blood clotting too easily:        Skin    Rashes or ulcers:        Constitutional    Fever or chills:      PHYSICAL EXAM:   Vitals:   11/02/17 1140  BP: (!) 162/88  Pulse: 70  Temp: 97.6 F (36.4 C)  TempSrc: Oral  SpO2: 98%  Weight: 232 lb (105.2 kg)  Height: 5\' 10"  (1.778 m)    GENERAL: The patient is a well-nourished male, in no acute distress. The vital signs are documented above. CARDIAC: There is a regular rate and rhythm.  VASCULAR: Nonpalpable pedal pulses.  No carotid bruits. PULMONARY: Non-labored respirations ABDOMEN: Soft and non-tender with  MUSCULOSKELETAL: There are no major deformities or cyanosis. NEUROLOGIC: No focal weakness or paresthesias are detected. SKIN: There are no ulcers or rashes noted. PSYCHIATRIC: The patient has a normal affect.  STUDIES:   I have reviewed his CTA with the following findings:  1. Somewhat irregular/lobular aneurysmal dilatation of the infrarenal abdominal aorta with a maximal diameter of 5.7 cm. 2. 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 3. 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 4. Hepatic cirrhosis with evidence of portal hypertension (mild splenomegaly and recanalized paraumbilical vein). 5. Prior T12 compression fracture with vertebra plana appearance and associated exaggerated thoracolumbar kyphosis. 6. Additional ancillary findings as above.  Carotid duplex: 1-39% bilateral  stenosis  MEDICAL ISSUES:   AAA: I discussed the CT scan findings today with the patient.  At this size, I have recommended proceeding with repair.  I feel like he is a candidate for endovascular intervention.  We discussed the risks and benefits of  the procedure including the risk of stroke, heart attack, cardiopulmonary complications, renal injury, intestinal ischemia, arterial injury, and bleeding.  All of his questions have been answered.  His procedure has been scheduled for Wednesday, July 17.  He will need to stop his Plavix prior to his operation.  I will initiate aspirin 81 mg when he stops his Plavix.  He is scheduled to see hematology regarding his thrombocytopenia.  His last platelet count was 80,000.  I will also send him to get formal cardiology clearance to proceed with surgery    Annamarie Major, MD Vascular and Vein Specialists of [facility] [phone] Pager [phone]

## 2017-11-02 NOTE — Telephone Encounter (Signed)
Pt having EVAR 11/25/17, needs cardiac clearance. Pt saw cardiologist Dr. Martinique at [facility] on 10/21/17. Faxed clearance form to [facility] [phone].

## 2017-11-03 ENCOUNTER — Encounter: Payer: Self-pay | Admitting: Surgery

## 2017-11-03 NOTE — Telephone Encounter (Signed)
   Primary Cardiologist:Peter Martinique, MD  Chart reviewed as part of pre-operative protocol coverage. Patient with h/o CAD, chronic combined CHF, HTN, HLD, Type 2 DM, GERD, and prior CVAs, AAA. Requested to clear for EVAR - recently seen 10/21/17 by Dr. Martinique. Given vascular surgery, will reach out to Dr. Martinique for cardiac clearance input. He does not appear to be on any antiplatelets or anticoagulants. Dr. Martinique - Please route response to [facility] PREOP (the pre-op pool). Thank you.  Charlie Pitter, PA-C 11/03/2017, 4:46 PM

## 2017-11-03 NOTE — Telephone Encounter (Signed)
Yes he is clear for procedure.I had already sent a message to Dr. Brabham  Samauri Kellenberger Martinique MD, [facility]

## 2017-11-04 NOTE — Telephone Encounter (Signed)
Left a message for Dr. Stephens Shire nurse that pt has been cleared for his procedure.

## 2017-11-04 NOTE — Telephone Encounter (Signed)
See note below. Callback can you call Dr. Stephens Shire office to make sure they are aware to close the loop? Thanks, Melina Copa PA-C

## 2017-11-17 NOTE — Pre-Procedure Instructions (Signed)
Jose Beck  11/17/2017      [id number] - [place], [address] [place] [address] Phone: [phone] Fax: [phone]    Your procedure is scheduled on Wed., November 25, 2017 from 8:30AM-10:43AM  Report to [facility] Admitting Entrance "A" at 6:30AM  Call this number if you have problems the morning of surgery:  [phone]   Remember:  Do not eat or drink after midnight on July 16th    Take these medicines the morning of surgery with A SIP OF WATER: Carvedilol (COREG). If needed NitroGLYCERIN (NITROSTAT) (Notify the nurse if you had to take this medicine).  As of today, stop taking all Other Aspirin Products, Vitamins, Fish oils, and Herbal medications. Also stop all NSAIDS i.e. Advil, Ibuprofen, Motrin, Aleve, Anaprox, Naproxen, BC, Goody Powders, and all Supplements.  How to Manage Your Diabetes Before and After Surgery  Why is it important to control my blood sugar before and after surgery? . Improving blood sugar levels before and after surgery helps healing and can limit problems. . A way of improving blood sugar control is eating a healthy diet by: o  Eating less sugar and carbohydrates o  Increasing activity/exercise o  Talking with your doctor about reaching your blood sugar goals . High blood sugars (greater than 180 mg/dL) can raise your risk of infections and slow your recovery, so you will need to focus on controlling your diabetes during the weeks before surgery. . Make sure that the doctor who takes care of your diabetes knows about your planned surgery including the date and location.  How do I manage my blood sugar before surgery? . Check your blood sugar at least 4 times a day, starting 2 days before surgery, to make sure that the level is not too high or low. o Check your blood sugar the morning of your surgery when you wake up and every 2 hours until you get to the Short Stay unit. . If your blood  sugar is less than 70 mg/dL, you will need to treat for low blood sugar: o Do not take insulin. o Treat a low blood sugar (less than 70 mg/dL) with  cup of clear juice (cranberry or apple), 4 glucose tablets, OR glucose gel. Recheck blood sugar in 15 minutes after treatment (to make sure it is greater than 70 mg/dL). If your blood sugar is not greater than 70 mg/dL on recheck, call [phone] o  for further instructions. . Report your blood sugar to the short stay nurse when you get to Short Stay.  If your CBG is greater than 220 mg/dL, you may take  of your sliding scale (correction) dose of insulin.  . If you are admitted to the hospital after surgery: o Your blood sugar will be checked by the staff and you will probably be given insulin after surgery (instead of oral diabetes medicines) to make sure you have good blood sugar levels. o The goal for blood sugar control after surgery is 80-180 mg/dL.  WHAT DO I DO ABOUT MY DIABETES MEDICATION?  . THE MORNING OF SURGERY, take _____10________ units of _____Levemir_____insulin.  Reviewed and Endorsed by [facility] Patient Education Committee, August 2015    Do not wear jewelry.  Do not wear lotions, powders, colognes, or deodorant.  Do not shave 48 hours prior to surgery.  Men may shave face and neck.  Do not bring valuables to the hospital.  [facility] is not  responsible for any belongings or valuables.  Contacts, dentures or bridgework may not be worn into surgery.  Leave your suitcase in the car.  After surgery it may be brought to your room.  For patients admitted to the hospital, discharge time will be determined by your treatment team.  Patients discharged the day of surgery will not be allowed to drive home.   Special instructions:  [facility]- Preparing For Surgery  Before surgery, you can play an important role. Because skin is not sterile, your skin needs to be as free of germs as possible. You can reduce the number of  germs on your skin by washing with CHG (chlorahexidine gluconate) Soap before surgery.  CHG is an antiseptic cleaner which kills germs and bonds with the skin to continue killing germs even after washing.    Oral Hygiene is also important to reduce your risk of infection.  Remember - BRUSH YOUR TEETH THE MORNING OF SURGERY WITH YOUR REGULAR TOOTHPASTE  Please do not use if you have an allergy to CHG or antibacterial soaps. If your skin becomes reddened/irritated stop using the CHG.  Do not shave (including legs and underarms) for at least 48 hours prior to first CHG shower. It is OK to shave your face.  Please follow these instructions carefully.   1. Shower the NIGHT BEFORE SURGERY and the MORNING OF SURGERY with CHG.   2. If you chose to wash your hair, wash your hair first as usual with your normal shampoo.  3. After you shampoo, rinse your hair and body thoroughly to remove the shampoo.  4. Use CHG as you would any other liquid soap. You can apply CHG directly to the skin and wash gently with a scrungie or a clean washcloth.   5. Apply the CHG Soap to your body ONLY FROM THE NECK DOWN.  Do not use on open wounds or open sores. Avoid contact with your eyes, ears, mouth and genitals (private parts). Wash Face and genitals (private parts)  with your normal soap.  6. Wash thoroughly, paying special attention to the area where your surgery will be performed.  7. Thoroughly rinse your body with warm water from the neck down.  8. DO NOT shower/wash with your normal soap after using and rinsing off the CHG Soap.  9. Pat yourself dry with a CLEAN TOWEL.  10. Wear CLEAN PAJAMAS to bed the night before surgery, wear comfortable clothes the morning of surgery  11. Place CLEAN SHEETS on your bed the night of your first shower and DO NOT SLEEP WITH PETS.  Day of Surgery:  Do not apply any deodorants/lotions.  Please wear clean clothes to the hospital/surgery center.   Remember to brush your  teeth WITH YOUR REGULAR TOOTHPASTE.  Please read over the following fact sheets that you were given. Pain Booklet, Coughing and Deep Breathing, MRSA Information and Surgical Site Infection Prevention

## 2017-11-17 NOTE — Progress Notes (Signed)
[place]  [phone]) [phone] [phone]  Clinic Follow Up  Note   Patient Care Team: Jose Bunting, MD as PCP - General (Internal Medicine) Beck, Jose M, MD as PCP - Cardiology (Cardiology)   Date of Service:  11/19/2017  CHIEF COMPLAINTS:  F/u for chronic thrombocytopenia  HISTORY OF PRESENTING ILLNESS:  Jose Beck 72 y.o. male is here because of thrombocytopenia. He was referred by his PCP Dr. Nanine Means. Reynaldo Beck for Thrombocytopenia.  He presents to my clinic with his wife today.    According to his prior labs from Dr. Reynaldo Beck, his PLT levels have been as follows: 81K as of 03/30/2017, 78K as of 03/02/2017, and 95K as of 01/21/2017.  His WBC and hemoglobin has been normal.  He was hospitalized in [place] for stroke in March 2017, and his CBC at that time showed platelet 90K.   He is accompanied by his wife Jose Beck today. He reports that he has been well overall. He was informed that he first had low platelet counts following an UTI on 02/25/2017. He was evaluated by EMS workers due to a fever and chills prior to being evaluated by Dr. Reynaldo Beck and placed on prescription abx. He had a fall the following Wednesday that resulted in a compressed vertebrae. He was then re-evaluated by Dr. Reynaldo Beck to have labs drawn and informed that the platelet counts were low.  Per chart review, on August 10, 2015 he had low platelet count of 84K. Patient wife notes that he had the labs drawn following a stroke and while he was in [place] for Rehab. Wife reports that the patient has only had one stroke with residual right sided weakness.    He denies prior history of abnormal WBC or anemia. He has been treated for DM and HTN. He denies kidney issues or heart failure. His wife notes that in 1992, he had several heart caths with 2 severely blocked arteries and one artery partially blocked. He didn't have a stent placed due to 100% blockage. He has had a prior  tonsillectomy and denies any other open surgeries. He denies family hx of cancer or blood disorders.   He doesn't smoke nor has he ever, he typically has 4-5 cocktails per night for the past 20-30 years. He now drinks a small shot of Shearon Stalls along with coke as his drink. His wife fixes the drinks for him so that it is a small amount of ETOH within the drink. He denies having abnormal liver function or any abnormal liver imaging. He denies any other recreational drug use. He notes that after he fell, he was able to quit ETOH use for a month without any issues.    He notes that he will have a carotid US on 11/02/2017 and an appointment with Dr. Trula Beck.   He was previously advised to discontinue use of Prevacid and he has been switched to Lipitor due to concern for liver issues.   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 He denies previous exposure to chemicals.   On review of systems, pt reports residual right sided weakness s/p his stroke. He denies CP and any other symptoms.    Current therapy:  observation  INTERVAL HISTORY  Jose Beck is here for follow up for chronic thrombocytopenia. He was last seen by me 7 months ago. In interim he presented to ED in 07/2017 due to change in speech. Work up showed no evidence  of a stroke.   He presents to the clinic today accompanied by his wife. He notes he plans to have surgery on his aneurism. He notes he has not stopped drinking alcohol and has been on Plavix which he is on since his stroke. He notes being seen by GI Dr. Earlean Beck for colonoscopy in 2017, nothing was found. He sees his PCP often, cardiologist every 6 months and occasionally his orthopedist.     MEDICAL HISTORY:  Past Medical History:  Diagnosis Date  . Abdominal aortic aneurysm (AAA) ([place])   . Aortic stenosis    a. 11/2015 Echo: EF 55-60%, Gr1 DD, mild AS.  Marland Kitchen Chronic combined systolic and diastolic CHF (congestive heart failure) ([place])    a.  07/2015: TEE showing EF of 40% b. 11/2015: Echo w/ EF 55-60%, Grade 1 DD, mild AS.  Marland Kitchen Coronary artery disease    a. patient reports 100% stenosis of LAD and RCA by cath in 1999. b. 11/2015 Cath: LM nl, LAD 100 - fills by L->L collats from D1, LCX 60d, OM1 lalrge/nl, OM2 small/nl, RCA 140m, L->R collats.EF 35-45% (55-60% by echo).  . Diabetes mellitus without complication (HCC)    Type II  . GERD (gastroesophageal reflux disease)   . Hyperlipemia   . Hypertension   . Hypertensive heart disease   . Stroke [facility])    a. 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 Thrombocytopenia ([place]) 11/2015    SURGICAL HISTORY: Past Surgical History:  Procedure Laterality Date  . CARDIAC CATHETERIZATION    . CARDIAC CATHETERIZATION N/A 11/16/2015   Procedure: Left Heart Cath and Coronary Angiography;  Surgeon: Jose Crome, MD;  Location: [facility];  Service: Cardiovascular;  Laterality: N/A;  . MOLE REMOVAL    . TONSILLECTOMY      SOCIAL HISTORY: Social History   Socioeconomic History  . Marital status: Married    Spouse name: Jose Beck  . Number of children: 0  . Years of education: College  . Highest education level: Not on file  Occupational History  . Occupation: Retired   Scientific laboratory technician  . Financial resource strain: Not on file  . Food insecurity:    Worry: Not on file    Inability: Not on file  . Transportation needs:    Medical: Not on file    Non-medical: Not on file  Tobacco Use  . Smoking status: Never Smoker  . Smokeless tobacco: Never Used  Substance and Sexual Activity  . Alcohol use: Yes    Alcohol/week: 0.6 oz    Types: 1 Shots of liquor per week    Comment: cocktails every day  . Drug use: No  . Sexual activity: Not on file  Lifestyle  . Physical activity:    Days per week: Not on file    Minutes per session: Not on file  . Stress: Not on file  Relationships  . Social connections:    Talks on phone: Not on file    Gets together: Not on file    Attends  religious service: Not on file    Active member of club or organization: Not on file    Attends meetings of clubs or organizations: Not on file    Relationship status: Not on file  . Intimate partner violence:    Fear of current or ex partner: Not on file    Emotionally abused: Not on file    Physically abused: Not on file    Forced sexual activity: Not  on file  Other Topics Concern  . 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 He denies previous exposure to chemicals.   FAMILY HISTORY: Family History  Problem Relation Age of Onset  . Hypertension Mother   . Heart disease Mother     ALLERGIES:  is allergic to cheese and pravastatin.  MEDICATIONS:  Current Outpatient Medications  Medication Sig Dispense Refill  . atorvastatin (LIPITOR) 80 MG tablet Take 1 tablet (80 mg total) by mouth daily at 6 PM. 30 tablet 6  . clopidogrel (PLAVIX) 75 MG tablet Take 1 tablet (75 mg total) by mouth daily. 30 tablet 1  . insulin detemir (LEVEMIR) 100 UNIT/ML injection Inject 20 Units into the skin every morning.     Marland Kitchen losartan (COZAAR) 50 MG tablet Take 1 tablet (50 mg total) by mouth daily. 90 tablet 2  . nitroGLYCERIN (NITROSTAT) 0.4 MG SL tablet Place 0.4 mg under the tongue every 5 (five) minutes as needed for chest pain (x 3 doses).    . tacrolimus (PROTOPIC) 0.1 % ointment Apply 1 application topically daily as needed (facial scaling).    . carvedilol (COREG) 25 MG tablet Take 1 tablet (25 mg total) by mouth 2 (two) times daily. 60 tablet 1   No current facility-administered medications for this visit.     REVIEW OF SYSTEMS:   Constitutional: Denies fevers, chills or abnormal night sweats Eyes: Denies blurriness of vision, double vision or watery eyes Ears, nose, mouth, throat, and face: Denies mucositis or sore throat Respiratory: Denies cough, dyspnea or wheezes Cardiovascular: Denies  palpitation, chest discomfort or lower extremity swelling Gastrointestinal:  Denies nausea, heartburn or change in bowel habits Skin: Denies abnormal skin rashes Lymphatics: Denies new lymphadenopathy or easy bruising Neurological:Denies numbness, tingling or new weaknesses. (+) right sided residual weakness s/p stroke (+) slurred speech  Behavioral/Psych: Mood is stable, no new changes  All other systems were reviewed with the patient and are negative.  PHYSICAL EXAMINATION:   Vitals:   11/19/17 1135  BP: (!) 187/91  Pulse: 71  Resp: 20  Temp: 98.4 F (36.9 C)  SpO2: 99%   Filed Weights   11/19/17 1135  Weight: 234 lb 8 oz (106.4 kg)    GENERAL:alert, no distress and comfortable SKIN: skin color, texture, turgor are normal, no rashes or significant lesions EYES: normal, conjunctiva are pink and non-injected, sclera clear OROPHARYNX:no exudate, no erythema and lips, buccal mucosa, and tongue normal  NECK: supple, thyroid normal size, non-tender, without nodularity LYMPH:  no palpable lymphadenopathy in the cervical, axillary or inguinal LUNGS: clear to auscultation and percussion with normal breathing effort HEART: regular rate & rhythm and (+) systolic murmur in the aortic valve area and no lower extremity edema ABDOMEN:abdomen soft, non-tender and normal bowel sounds. No hepatosplenomegaly.  Musculoskeletal:no cyanosis of digits and no clubbing  PSYCH: alert & oriented x 3 with fluent speech NEURO: no focal motor/sensory deficits  LABORATORY DATA:  I have reviewed the data as listed CBC Latest Ref Rng & Units 11/18/2017 08/13/2017 07/21/2017  WBC 4.0 - 10.5 K/uL 4.3 3.6(L) -  Hemoglobin 13.0 - 17.0 g/dL 14.1 13.6 13.9  Hematocrit 39.0 - 52.0 % 42.7 39.4 41.0  Platelets 150 - 400 K/uL 105(L) 83(L) -    CMP Latest Ref Rng & Units 11/18/2017 07/21/2017 07/21/2017  Glucose 70 - 99 mg/dL 132(H) 131(H) 134(H)  BUN  8 - 23 mg/dL 14 18 15   Creatinine 0.61 - 1.24 mg/dL 1.04 1.00  1.05  Sodium 135 - 145 mmol/L 132(L) 133(L) 131(L)  Potassium 3.5 - 5.1 mmol/L 4.1 4.4 4.4  Chloride 98 - 111 mmol/L 102 97(L) 100(L)  CO2 22 - 32 mmol/L 22 - 22  Calcium 8.9 - 10.3 mg/dL 9.1 - 9.0  Total Protein 6.5 - 8.1 g/dL 7.1 - 7.2  Total Bilirubin 0.3 - 1.2 mg/dL 0.7 - 0.7  Alkaline Phos 38 - 126 U/L 51 - 52  AST 15 - 41 U/L 24 - 23  ALT 0 - 44 U/L 19 - 20     RADIOGRAPHIC STUDIES: I have personally reviewed the radiological images as listed and agreed with the findings in the report. Ct Angio Chest Aorta W &/or Wo Contrast  Result Date: 11/02/2017 CLINICAL DATA:  72 year old male with a history of abdominal aortic aneurysm EXAM: CT ANGIOGRAPHY CHEST, ABDOMEN AND PELVIS TECHNIQUE: Multidetector CT imaging through the chest, abdomen and pelvis was performed using the standard protocol during bolus administration of intravenous contrast. Multiplanar reconstructed images and MIPs were obtained and reviewed to evaluate the vascular anatomy. Creatinine was obtained on site at [facility] at [address] Results: Creatinine 1.0 mg/dL. CONTRAST:  137mL ISOVUE-370 IOPAMIDOL (ISOVUE-370) INJECTION 76% COMPARISON:  None. FINDINGS: CTA CHEST FINDINGS Cardiovascular: Conventional 3 vessel arch anatomy. The thoracic aorta is normal in caliber. The aortic root measures 3.5 cm in diameter at the sinuses of Valsalva. Scattered atherosclerotic plaque is present. No significant penetrating ulceration. No evidence of dissection. Calcifications are present along the course of the coronary arteries. The heart is within normal limits for size. Normal caliber main pulmonary artery. No central pulmonary embolus. No evidence of pericardial effusion. Mediastinum/Nodes: Unremarkable CT appearance of the thyroid gland. No suspicious mediastinal or hilar adenopathy. No soft tissue mediastinal mass. Small hiatal hernia. Lungs/Pleura: 4 mm subpleural pulmonary nodule along the right minor fissure (image 67  series 7). Minimal dependent atelectasis. Trace subpleural reticulation in the periphery of the lower lungs consistent with very mild fibrotic changes. The lungs are otherwise clear. No suspicious pulmonary nodule, pleural effusion or pneumothorax. Musculoskeletal: No acute fracture or aggressive appearing lytic or blastic osseous lesion. Review of the MIP images confirms the above findings. CTA ABDOMEN AND PELVIS FINDINGS VASCULAR Aorta: Mild heterogeneous atherosclerotic plaque. Lobulated fusiform dilatation of the infrarenal abdominal aorta with a maximal diameter of 5.7 x 3.9 cm. There is mild wall adherent thrombus. No evidence of dissection. Celiac: Heterogeneous atherosclerotic plaque at the origin without significant stenosis. No evidence of dissection or aneurysm. SMA: Predominantly calcified atherosclerotic plaque at the origin results in perhaps mild narrowing. No dissection or aneurysm. Renals: There are 2 renal arteries bilaterally. Both of the left renal arteries are lower than the 2 right-sided renal arteries. Heterogeneous atherosclerotic plaque is present at the origins with perhaps mild narrowing. No significant renal artery stenosis. No changes of fibromuscular dysplasia. IMA: Patent. Inflow: Heterogeneous atherosclerotic plaque without focal stenosis, aneurysm or dissection involving either internal or external iliac artery. There is mild aneurysmal dilatation of the left internal iliac artery with a maximal diameter of 1.3 cm. No significant stenosis or dissection. Veins: No focal venous abnormality. Review of the MIP images confirms the above findings. NON-VASCULAR Hepatobiliary: Diffusely nodular hepatic contour with blunting of the free edge of the left hepatic lobe. Imaging findings are most consistent with hepatic cirrhosis. No discrete arterially enhancing lesion. There is a small 0.9 cm  low-attenuation lesion in the superior aspect of hepatic segment 8/5 likely representing a benign cyst.  No intra or extrahepatic biliary ductal dilatation. The gallbladder is unremarkable. Small recanalized paraumbilical vein. Pancreas: Unremarkable. No pancreatic ductal dilatation or surrounding inflammatory changes. Spleen: Borderline splenomegaly. Adrenals/Urinary Tract: Normal appearance of the adrenal glands. Relatively symmetric renal parenchymal enhancement bilaterally. No enhancing renal mass, hydronephrosis or nephrolithiasis. The ureters and bladder are unremarkable. Stomach/Bowel: Relatively high-riding cecum. No focal bowel wall thickening or evidence of obstruction. Lymphatic: No suspicious lymphadenopathy. Reproductive: Prostate is unremarkable. Other: No abdominal wall hernia or abnormality. No abdominopelvic ascites. Musculoskeletal: No acute fracture or aggressive appearing lytic or blastic osseous lesion. Evidence of prior T12 compression fracture with vertebra plana appearance. There is associated exaggerated thoracolumbar kyphosis. Enhanced trabeculation throughout the L1 vertebral body consistent with the presence of a large hemangioma. Review of the MIP images confirms the above findings. IMPRESSION: CTA CHEST 1. No evidence of thoracic aortic aneurysm, dissection or penetrating ulcer. 2. Coronary artery calcifications. 3.  Aortic Atherosclerosis (ICD10-170.0) 4. Small hiatal hernia. 5. Mild peripheral basilar fibrotic changes in the lower lungs. 6. Small 4 mm subpleural right lower lobe pulmonary nodule. No follow-up needed if patient is low-risk. Non-contrast chest CT can be considered in 12 months if patient is high-risk. This recommendation follows the consensus statement: Guidelines for Management of Incidental Pulmonary Nodules Detected on CT Images: From the Fleischner Society 2017; Radiology 2017; 284:228-243. CTA ABD/PELVIS 1. Somewhat irregular/lobular aneurysmal dilatation of the infrarenal abdominal aorta with a maximal diameter of 5.7 cm. 2. Two renal arteries are present  bilaterally, the 2 left renal arteries are lower than the 2 right renal arteries. 3. 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 4. Hepatic cirrhosis with evidence of portal hypertension (mild splenomegaly and recanalized paraumbilical vein). 5. Prior T12 compression fracture with vertebra plana appearance and associated exaggerated thoracolumbar kyphosis. 6. Additional ancillary findings as above. Signed, Criselda Peaches, MD [facility] Radiology Electronically Signed   By: Jacqulynn Cadet M.D.   On: 11/02/2017 11:39   Ct Angio Abdomen Pelvis  W &/or Wo Contrast  Result Date: 11/02/2017 CLINICAL DATA:  72 year old male with a history of abdominal aortic aneurysm EXAM: CT ANGIOGRAPHY CHEST, ABDOMEN AND PELVIS TECHNIQUE: Multidetector CT imaging through the chest, abdomen and pelvis was performed using the standard protocol during bolus administration of intravenous contrast. Multiplanar reconstructed images and MIPs were obtained and reviewed to evaluate the vascular anatomy. Creatinine was obtained on site at [facility] at [address] Results: Creatinine 1.0 mg/dL. CONTRAST:  127mL ISOVUE-370 IOPAMIDOL (ISOVUE-370) INJECTION 76% COMPARISON:  None. FINDINGS: CTA CHEST FINDINGS Cardiovascular: Conventional 3 vessel arch anatomy. The thoracic aorta is normal in caliber. The aortic root measures 3.5 cm in diameter at the sinuses of Valsalva. Scattered atherosclerotic plaque is present. No significant penetrating ulceration. No evidence of dissection. Calcifications are present along the course of the coronary arteries. The heart is within normal limits for size. Normal caliber main pulmonary artery. No central pulmonary embolus. No evidence of pericardial effusion. Mediastinum/Nodes: Unremarkable CT appearance of the thyroid gland. No suspicious mediastinal or hilar adenopathy. No soft tissue mediastinal mass.  Small hiatal hernia. Lungs/Pleura: 4 mm subpleural pulmonary nodule along the right minor fissure (image 67 series 7). Minimal dependent atelectasis. Trace subpleural reticulation in the periphery of the lower lungs consistent with very mild fibrotic changes. The lungs are otherwise clear. No suspicious pulmonary nodule, pleural  effusion or pneumothorax. Musculoskeletal: No acute fracture or aggressive appearing lytic or blastic osseous lesion. Review of the MIP images confirms the above findings. CTA ABDOMEN AND PELVIS FINDINGS VASCULAR Aorta: Mild heterogeneous atherosclerotic plaque. Lobulated fusiform dilatation of the infrarenal abdominal aorta with a maximal diameter of 5.7 x 3.9 cm. There is mild wall adherent thrombus. No evidence of dissection. Celiac: Heterogeneous atherosclerotic plaque at the origin without significant stenosis. No evidence of dissection or aneurysm. SMA: Predominantly calcified atherosclerotic plaque at the origin results in perhaps mild narrowing. No dissection or aneurysm. Renals: There are 2 renal arteries bilaterally. Both of the left renal arteries are lower than the 2 right-sided renal arteries. Heterogeneous atherosclerotic plaque is present at the origins with perhaps mild narrowing. No significant renal artery stenosis. No changes of fibromuscular dysplasia. IMA: Patent. Inflow: Heterogeneous atherosclerotic plaque without focal stenosis, aneurysm or dissection involving either internal or external iliac artery. There is mild aneurysmal dilatation of the left internal iliac artery with a maximal diameter of 1.3 cm. No significant stenosis or dissection. Veins: No focal venous abnormality. Review of the MIP images confirms the above findings. NON-VASCULAR Hepatobiliary: Diffusely nodular hepatic contour with blunting of the free edge of the left hepatic lobe. Imaging findings are most consistent with hepatic cirrhosis. No discrete arterially enhancing lesion. There is a small  0.9 cm low-attenuation lesion in the superior aspect of hepatic segment 8/5 likely representing a benign cyst. No intra or extrahepatic biliary ductal dilatation. The gallbladder is unremarkable. Small recanalized paraumbilical vein. Pancreas: Unremarkable. No pancreatic ductal dilatation or surrounding inflammatory changes. Spleen: Borderline splenomegaly. Adrenals/Urinary Tract: Normal appearance of the adrenal glands. Relatively symmetric renal parenchymal enhancement bilaterally. No enhancing renal mass, hydronephrosis or nephrolithiasis. The ureters and bladder are unremarkable. Stomach/Bowel: Relatively high-riding cecum. No focal bowel wall thickening or evidence of obstruction. Lymphatic: No suspicious lymphadenopathy. Reproductive: Prostate is unremarkable. Other: No abdominal wall hernia or abnormality. No abdominopelvic ascites. Musculoskeletal: No acute fracture or aggressive appearing lytic or blastic osseous lesion. Evidence of prior T12 compression fracture with vertebra plana appearance. There is associated exaggerated thoracolumbar kyphosis. Enhanced trabeculation throughout the L1 vertebral body consistent with the presence of a large hemangioma. Review of the MIP images confirms the above findings. IMPRESSION: CTA CHEST 1. No evidence of thoracic aortic aneurysm, dissection or penetrating ulcer. 2. Coronary artery calcifications. 3.  Aortic Atherosclerosis (ICD10-170.0) 4. Small hiatal hernia. 5. Mild peripheral basilar fibrotic changes in the lower lungs. 6. Small 4 mm subpleural right lower lobe pulmonary nodule. No follow-up needed if patient is low-risk. Non-contrast chest CT can be considered in 12 months if patient is high-risk. This recommendation follows the consensus statement: Guidelines for Management of Incidental Pulmonary Nodules Detected on CT Images: From the Fleischner Society 2017; Radiology 2017; 284:228-243. CTA ABD/PELVIS 1. Somewhat irregular/lobular aneurysmal dilatation of  the infrarenal abdominal aorta with a maximal diameter of 5.7 cm. 2. 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 3. 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 4. Hepatic cirrhosis with evidence of portal hypertension (mild splenomegaly and recanalized paraumbilical vein). 5. Prior T12 compression fracture with vertebra plana appearance and associated exaggerated thoracolumbar kyphosis. 6. Additional ancillary findings as above. Signed, Criselda Peaches, MD [facility] Radiology Electronically Signed   By: Jacqulynn Cadet M.D.   On: 11/02/2017 11:39    ASSESSMENT & PLAN:  No problem-specific Assessment &  Plan notes found for this encounter.   1. Thrombocytopenia, probably secondary to liver cirrhosis and splenomegaly -Patient has been having mild thrombocytopenia with platelets in the range of 70-110K since 07/2015 (prior lab was not available), no history of significant bleeding. His WBC and hemoglobin has been normal. -CT Angio Scan from 11/02/17 showed liver cirrhosis with portal HTN and mild splenomegaly.  I think his thrombocytopenia is likely related to liver cirrhosis, spleen megaly and alcohol drinking. -Again strongly encouraged him to stop alcohol drinking, which will further damage his liver and cause worsening thrombocytopenia,.  He agrees to try.  He previously stopped alcohol without any withdrawal symptoms. -Labs reviewed, CBC from yesterday showed normal clotting factors, plt improved to 105K. Overall adequate for is upcoming aortic aneurysm surgery next week, he is cleared to proceed with surgery next week.  -He does not need treatment at this time.  -I recommend he regularly check his blood counts at least every 6 months, or more frequent if he experience bleeding or bruising  -I discussed if his thrombocytopenia gets worse, and if  he needs to undergo surgery,  there is a medication option (Doptelet) to increase his plt counts before surgery or procedure.  -F/u in 9 months and then yearly.     2. H/o stroke  -First stroke in 2017 -He presented to ED on 07/21/17 for change in speech, workup showed no evidence of CVA. Will continue to follow up with Cardiologist.  -He is on Plavix.  -Plans to undergo abdominal aortic endovascular stent grafting to repair aneurism on 11/25/17. He will replace Plavix temporarily with aspirin for a few days surrounding his surgery.   3. Liver Cirrhosis -Seen on CT angio scan from 11/02/17. His Hepatitis panel was negative.  -I discussed this increases his risk for liver cancer.  -I advised him again to stop drinking as this can cause further damage. He is willing to stop drinking alcohol.  -I recommend he see a liver specialist, GI Dr. Earlean Beck or PCP for management after his surgery next week. I will copy by note to his PCP today. -I will monitor his AFP tumor marker starting at next visit. He should have US abdomen (or CT if need for other reason) every 6 months for liver cancer screening    Plan:  -Labs reviewed an adequate to proceed with abdominal aneurysm surgery next week  -He will f/u PCP with lab every 6-12 months  -Lab and f/u in 9 months    All questions were answered. The patient knows to call the clinic with any problems, questions or concerns. I spent 20 minutes counseling the patient face to face. The total time spent in the appointment was 25 minutes and more than 50% was on counseling.     Truitt Merle, MD 11/19/17   I, Joslyn Devon, am acting as scribe for Truitt Merle, MD.   I have reviewed the above documentation for accuracy and completeness, and I agree with the above.

## 2017-11-18 ENCOUNTER — Encounter (HOSPITAL_COMMUNITY)
Admission: RE | Admit: 2017-11-18 | Discharge: 2017-11-18 | Disposition: A | Discharge status: Home / Self Care | Payer: Medicare Other | Source: Ambulatory Visit | Attending: Surgery | Admitting: Surgery

## 2017-11-18 ENCOUNTER — Other Ambulatory Visit (HOSPITAL_COMMUNITY): Payer: Medicare Other

## 2017-11-18 ENCOUNTER — Encounter (HOSPITAL_COMMUNITY): Payer: Self-pay

## 2017-11-18 ENCOUNTER — Other Ambulatory Visit: Payer: Self-pay

## 2017-11-18 DIAGNOSIS — E119 Type 2 diabetes mellitus without complications: Secondary | ICD-10-CM | POA: Insufficient documentation

## 2017-11-18 DIAGNOSIS — Z01812 Encounter for preprocedural laboratory examination: Secondary | ICD-10-CM | POA: Diagnosis not present

## 2017-11-18 HISTORY — DX: Abdominal aortic aneurysm, without rupture: I71.4

## 2017-11-18 HISTORY — DX: Abdominal aortic aneurysm, without rupture, unspecified: I71.40

## 2017-11-18 LAB — PROTIME-INR
INR: 1.08
Prothrombin Time: 13.9 seconds (ref 11.4–15.2)

## 2017-11-18 LAB — URINALYSIS, ROUTINE W REFLEX MICROSCOPIC
BILIRUBIN URINE: NEGATIVE
Glucose, UA: NEGATIVE mg/dL
Hgb urine dipstick: NEGATIVE
KETONES UR: NEGATIVE mg/dL
LEUKOCYTES UA: NEGATIVE
NITRITE: NEGATIVE
PH: 6 (ref 5.0–8.0)
PROTEIN: NEGATIVE mg/dL
Specific Gravity, Urine: 1.014 (ref 1.005–1.030)

## 2017-11-18 LAB — CBC
HCT: 42.7 % (ref 39.0–52.0)
Hemoglobin: 14.1 g/dL (ref 13.0–17.0)
MCH: 33.1 pg (ref 26.0–34.0)
MCHC: 33 g/dL (ref 30.0–36.0)
MCV: 100.2 fL — ABNORMAL HIGH (ref 78.0–100.0)
PLATELETS: 105 10*3/uL — AB (ref 150–400)
RBC: 4.26 MIL/uL (ref 4.22–5.81)
RDW: 13.1 % (ref 11.5–15.5)
WBC: 4.3 10*3/uL (ref 4.0–10.5)

## 2017-11-18 LAB — COMPREHENSIVE METABOLIC PANEL
ALK PHOS: 51 U/L (ref 38–126)
ALT: 19 U/L (ref 0–44)
AST: 24 U/L (ref 15–41)
Albumin: 3.8 g/dL (ref 3.5–5.0)
Anion gap: 8 (ref 5–15)
BILIRUBIN TOTAL: 0.7 mg/dL (ref 0.3–1.2)
BUN: 14 mg/dL (ref 8–23)
CALCIUM: 9.1 mg/dL (ref 8.9–10.3)
CO2: 22 mmol/L (ref 22–32)
CREATININE: 1.04 mg/dL (ref 0.61–1.24)
Chloride: 102 mmol/L (ref 98–111)
GFR calc Af Amer: >60 mL/min (ref >60)
Glucose, Bld: 132 mg/dL — ABNORMAL HIGH (ref 70–99)
Potassium: 4.1 mmol/L (ref 3.5–5.1)
Sodium: 132 mmol/L — ABNORMAL LOW (ref 135–145)
TOTAL PROTEIN: 7.1 g/dL (ref 6.5–8.1)

## 2017-11-18 LAB — HEMOGLOBIN A1C
HEMOGLOBIN A1C: 5.7 % — AB (ref 4.8–5.6)
Mean Plasma Glucose: 116.89 mg/dL

## 2017-11-18 LAB — APTT: APTT: 32 s (ref 24–36)

## 2017-11-18 LAB — SURGICAL PCR SCREEN
MRSA, PCR: NEGATIVE
Staphylococcus aureus: NEGATIVE

## 2017-11-18 LAB — GLUCOSE, CAPILLARY: Glucose-Capillary: 158 mg/dL — ABNORMAL HIGH (ref 70–99)

## 2017-11-18 NOTE — Progress Notes (Signed)
PCP - Dr. Nada Libman  Cardiologist - Dr. Peter Martinique  Chest x-ray - Denies  EKG - 07/21/17 (E)  Stress Test - Denies  ECHO - 11/16/15 (E)  Cardiac Cath - 11/16/15 (E)  Sleep Study - Yes- Negative CPAP - None  LABS- 11/18/17: CBC, CMP, PT, PTT, T/S, UA  ASA- Start 7/13 Plavix- LD- 7/12  HA1C- 11/18/17 Fasting Blood Sugar - 77-108, Today 158 Checks Blood Sugar __1___ time a day  Anesthesia- Yes- Cardiac history  Pt denies having chest pain, sob, or fever at this time. All instructions explained to the pt, with a verbal understanding of the material. Pt agrees to go over the instructions while at home for a better understanding. The opportunity to ask questions was provided.

## 2017-11-19 ENCOUNTER — Encounter: Payer: Self-pay | Admitting: Hematology

## 2017-11-19 ENCOUNTER — Inpatient Hospital Stay: Payer: Medicare Other | Attending: Hematology

## 2017-11-19 ENCOUNTER — Inpatient Hospital Stay (HOSPITAL_BASED_OUTPATIENT_CLINIC_OR_DEPARTMENT_OTHER): Payer: Medicare Other | Admitting: Hematology

## 2017-11-19 VITALS — BP 187/91 | HR 71 | Temp 98.4°F | Resp 20 | Ht 70.0 in | Wt 234.5 lb

## 2017-11-19 DIAGNOSIS — R161 Splenomegaly, not elsewhere classified: Secondary | ICD-10-CM

## 2017-11-19 DIAGNOSIS — F1010 Alcohol abuse, uncomplicated: Secondary | ICD-10-CM | POA: Insufficient documentation

## 2017-11-19 DIAGNOSIS — I714 Abdominal aortic aneurysm, without rupture: Secondary | ICD-10-CM

## 2017-11-19 DIAGNOSIS — D696 Thrombocytopenia, unspecified: Secondary | ICD-10-CM | POA: Insufficient documentation

## 2017-11-19 DIAGNOSIS — K7460 Unspecified cirrhosis of liver: Secondary | ICD-10-CM | POA: Diagnosis not present

## 2017-11-19 DIAGNOSIS — Z8673 Personal history of transient ischemic attack (TIA), and cerebral infarction without residual deficits: Secondary | ICD-10-CM | POA: Insufficient documentation

## 2017-11-19 DIAGNOSIS — K7030 Alcoholic cirrhosis of liver without ascites: Secondary | ICD-10-CM

## 2017-11-19 NOTE — Progress Notes (Signed)
Positive antibodies; repeat type and screen on DOS.

## 2017-11-19 NOTE — Progress Notes (Signed)
Anesthesia Chart Review:   Case:  [id number] Date/Time:  11/25/17 0815   Procedure:  ABDOMINAL AORTIC ENDOVASCULAR STENT GRAFT (N/A )   Anesthesia type:  General   Pre-op diagnosis:  ABDOMINAL AORTIC ANEURYSM   Location:  [facility] OR ROOM 38 / [facility]   Surgeon:  Serafina Mitchell, MD      DISCUSSION: - Pt is a 72 year old with hx CAD (CTO LAD and RCA with collateral flow, moderate disease otherwise by 2017 cath), CHF, CVA, aortic stenosis (mild by 2017 echo), AAA, HTN, DM, thrombocytopenia  - Last dose plavix 11/20/17. Pt to start ASA 11/21/17   VS: BP (!) 159/92 Comment: rechecked after four minutes  Pulse 82   Temp 36.6 C   Resp 20   Ht 5\' 10"  (1.778 m)   Wt 234 lb 14.4 oz (106.5 kg)   SpO2 99%   BMI 33.70 kg/m    PROVIDERS: PCP is [name], MD Cardiologist is Peter Martinique, MD who cleared pt for surgery. Last office visit 10/21/17   LABS: Labs reviewed: Acceptable for surgery. (all labs ordered are listed, but only abnormal results are displayed)  Labs Reviewed  GLUCOSE, CAPILLARY - Abnormal; Notable for the following components:      Result Value   Glucose-Capillary 158 (*)    All other components within normal limits  CBC - Abnormal; Notable for the following components:   MCV 100.2 (*)    Platelets 105 (*)    All other components within normal limits  COMPREHENSIVE METABOLIC PANEL - Abnormal; Notable for the following components:   Sodium 132 (*)    Glucose, Bld 132 (*)    All other components within normal limits  HEMOGLOBIN A1C - Abnormal; Notable for the following components:   Hgb A1c MFr Bld 5.7 (*)    All other components within normal limits  SURGICAL PCR SCREEN  APTT  PROTIME-INR  URINALYSIS, ROUTINE W REFLEX MICROSCOPIC  TYPE AND SCREEN     IMAGES:  CT angio chest 11/02/17:  1. No evidence of thoracic aortic aneurysm, dissection or penetrating ulcer. 2. Coronary artery calcifications. 3.  Aortic Atherosclerosis  4. Small hiatal hernia. 5. Mild  peripheral basilar fibrotic changes in the lower lungs. 6. Small 4 mm subpleural right lower lobe pulmonary nodule. No follow-up needed if patient is low-risk. Non-contrast chest CT can be considered in 12 months if patient is high-risk.  CT angio abd, pelvis 11/02/17:  1. Somewhat irregular/lobular aneurysmal dilatation of the infrarenal abdominal aorta with a maximal diameter of 5.7 cm. 2. 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 3. 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 4. Hepatic cirrhosis with evidence of portal hypertension (mild splenomegaly and recanalized paraumbilical vein). 5. Prior T12 compression fracture with vertebra plana appearance and associated exaggerated thoracolumbar kyphosis. 6. Additional ancillary findings as above.   EKG 07/21/17: Sinus rhythm. Low voltage, precordial leads. Borderline T wave abnormalities   CV:  Carotid duplex 11/02/17:  - Right Carotid: Velocities in the right ICA are consistent with a 1-39% stenosis. - Left Carotid: Velocities in the left ICA are consistent with a 1-39% stenosis. - Vertebrals: Bilateral vertebral arteries demonstrate antegrade flow. - Subclavians: Normal flow hemodynamics were seen in bilateral subclavian arteries.  Cardiac cath 11/16/15:  1. Mid RCA lesion, 100% stenosed. 2. Ost 1st Diag to 1st Diag lesion, 70% stenosed. 3. Mid LAD lesion,  100% stenosed. 4. Dist RCA lesion, 100% stenosed. 5. Dist Cx lesion, 60% stenosed.  Total occlusion of the proximal RCA. RCA is heavily calcified. RCA fills by collaterals from the circumflex coronary of the left system. RCA is large in distribution.  Total occlusion of the proximal to mid LAD after the origin of the first of perforator and first diagonal. Left to left collateral supply the LAD and a second diagonal. The first diagonal contains eccentric 50-70% narrowing.  Widely  patent CX including a small ramus intermedius/first obtuse marginal, and a large branching second obtuse marginal. The distal CX beyond the second marginal is small and contains 50-70% narrowing. The circumflex is the source of collaterals to the distal right coronary.  Mildly depressed LV function with estimated EF in the 35 to 40% range. LVEDP is mildly elevated. The inferior wall is hypokinetic.  Echo 11/16/15:  - Left ventricle: The cavity size was normal. Systolic function was normal. The estimated ejection fraction was in the range of 55% to 60%. Wall motion was normal; there were no regional wall motion abnormalities. There was an increased relative contribution of atrial contraction to ventricular filling. Doppler parameters are consistent with abnormal left ventricular relaxation (grade 1 diastolic dysfunction). - Aortic valve: Moderately calcified annulus. Trileaflet. Moderate diffuse thickening and calcification. There was mild stenosis. Valve area (VTI): 2.17 cm^2. Valve area (Vmax): 1.88 cm^2. Valve area (Vmean): 1.75 cm^2.    Past Medical History:  Diagnosis Date  . Abdominal aortic aneurysm (AAA) ([place])   . Aortic stenosis    a. 11/2015 Echo: EF 55-60%, Gr1 DD, mild AS.  Marland Kitchen Chronic combined systolic and diastolic CHF (congestive heart failure) ([place])    a. 07/2015: TEE showing EF of 40% b. 11/2015: Echo w/ EF 55-60%, Grade 1 DD, mild AS.  Marland Kitchen Coronary artery disease    a. patient reports 100% stenosis of LAD and RCA by cath in 1999. b. 11/2015 Cath: LM nl, LAD 100 - fills by L->L collats from D1, LCX 60d, OM1 lalrge/nl, OM2 small/nl, RCA 131m, L->R collats.EF 35-45% (55-60% by echo).  . Diabetes mellitus without complication (HCC)    Type II  . GERD (gastroesophageal reflux disease)   . Hyperlipemia   . Hypertension   . Hypertensive heart disease   . Stroke [facility])    a. 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 Thrombocytopenia ([place]) 11/2015    Past Surgical History:  Procedure  Laterality Date  . CARDIAC CATHETERIZATION    . CARDIAC CATHETERIZATION N/A 11/16/2015   Procedure: Left Heart Cath and Coronary Angiography;  Surgeon: [name], MD;  Location: [place] [facility];  Service: Cardiovascular;  Laterality: N/A;  . MOLE REMOVAL    . TONSILLECTOMY      MEDICATIONS: . atorvastatin (LIPITOR) 80 MG tablet  . carvedilol (COREG) 25 MG tablet  . clopidogrel (PLAVIX) 75 MG tablet  . insulin detemir (LEVEMIR) 100 UNIT/ML injection  . losartan (COZAAR) 50 MG tablet  . nitroGLYCERIN (NITROSTAT) 0.4 MG SL tablet  . tacrolimus (PROTOPIC) 0.1 % ointment   No current facility-administered medications for this encounter.     If no changes, I anticipate pt can proceed with surgery as scheduled.   Willeen Cass, FNP-BC [facility] Stay [facility]/Anesthesiology Phone: [phone] 11/19/2017 12:32 PM

## 2017-11-20 ENCOUNTER — Encounter: Payer: Self-pay | Admitting: Surgery

## 2017-11-24 ENCOUNTER — Encounter (HOSPITAL_COMMUNITY): Payer: Self-pay | Admitting: Anesthesiology

## 2017-11-24 NOTE — Anesthesia Preprocedure Evaluation (Addendum)
Anesthesia Evaluation  Patient identified by MRN, date of birth, ID band Patient awake    Reviewed: Allergy & Precautions, NPO status , Patient's Chart, lab work & pertinent test results  Airway Mallampati: II  TM Distance: >3 FB Neck ROM: Full    Dental  (+) Chipped, Dental Advisory Given,    Pulmonary    breath sounds clear to auscultation       Cardiovascular hypertension, Pt. on home beta blockers + CAD, + Peripheral Vascular Disease and +CHF  + Valvular Problems/Murmurs AS  Rhythm:Regular Rate:Normal + Systolic murmurs    Neuro/Psych CVA, Residual Symptoms    GI/Hepatic Neg liver ROS, GERD  ,  Endo/Other  diabetes, Type 2, Insulin Dependent  Renal/GU negative Renal ROS     Musculoskeletal negative musculoskeletal ROS (+)   Abdominal (+) + obese,   Peds  Hematology negative hematology ROS (+)   Anesthesia Other Findings - HLD  Reproductive/Obstetrics                            Anesthesia Physical Anesthesia Plan  ASA: III  Anesthesia Plan: General   Post-op Pain Management:    Induction: Intravenous  PONV Risk Score and Plan: 3 and Ondansetron, Dexamethasone, Midazolam and Treatment may vary due to age or medical condition  Airway Management Planned: Oral ETT  Additional Equipment: Arterial line  Intra-op Plan:   Post-operative Plan: Extubation in OR  Informed Consent: I have reviewed the patients History and Physical, chart, labs and discussed the procedure including the risks, benefits and alternatives for the proposed anesthesia with the patient or authorized representative who has indicated his/her understanding and acceptance.   Dental advisory given  Plan Discussed with: CRNA  Anesthesia Plan Comments:        Lab Results  Component Value Date   WBC 4.3 11/18/2017   HGB 14.1 11/18/2017   HCT 42.7 11/18/2017   MCV 100.2 (H) 11/18/2017   PLT 105 (L)  11/18/2017   Lab Results  Component Value Date   CREATININE 1.04 11/18/2017   BUN 14 11/18/2017   NA 132 (L) 11/18/2017   K 4.1 11/18/2017   CL 102 11/18/2017   CO2 22 11/18/2017   Lab Results  Component Value Date   INR 1.08 11/18/2017   INR 1.10 07/21/2017   INR 1.13 11/16/2015   EKG: NSR.  Echo 2017: - Left ventricle: The cavity size was normal. Systolic function was   normal. The estimated ejection fraction was in the range of 55%   to 60%. Wall motion was normal; there were no regional wall   motion abnormalities. There was an increased relative   contribution of atrial contraction to ventricular filling.   Doppler parameters are consistent with abnormal left ventricular   relaxation (grade 1 diastolic dysfunction). - Aortic valve: Moderately calcified annulus. Trileaflet. Moderate   diffuse thickening and calcification. There was mild stenosis.   Valve area (VTI): 2.17 cm^2. Valve area (Vmax): 1.88 cm^2. Valve   area (Vmean): 1.75 cm^2.  Anesthesia Quick Evaluation

## 2017-11-25 ENCOUNTER — Inpatient Hospital Stay (HOSPITAL_COMMUNITY)
Admission: RE | Admit: 2017-11-25 | Discharge: 2017-11-26 | DRG: 269 | Disposition: A | Discharge status: Home / Self Care | Payer: Medicare Other | Source: Ambulatory Visit | Attending: Surgery | Admitting: Surgery

## 2017-11-25 ENCOUNTER — Encounter (HOSPITAL_COMMUNITY): Payer: Self-pay | Admitting: *Deleted

## 2017-11-25 ENCOUNTER — Inpatient Hospital Stay (HOSPITAL_COMMUNITY): Payer: Medicare Other | Admitting: Emergency Medicine

## 2017-11-25 ENCOUNTER — Inpatient Hospital Stay (HOSPITAL_COMMUNITY): Payer: Medicare Other | Admitting: Anesthesiology

## 2017-11-25 ENCOUNTER — Encounter (HOSPITAL_COMMUNITY)
Admission: RE | Disposition: A | Discharge status: Home / Self Care | Payer: Self-pay | Source: Ambulatory Visit | Attending: Surgery

## 2017-11-25 ENCOUNTER — Inpatient Hospital Stay (HOSPITAL_COMMUNITY): Payer: Medicare Other

## 2017-11-25 DIAGNOSIS — Z7902 Long term (current) use of antithrombotics/antiplatelets: Secondary | ICD-10-CM

## 2017-11-25 DIAGNOSIS — Z794 Long term (current) use of insulin: Secondary | ICD-10-CM

## 2017-11-25 DIAGNOSIS — I110 Hypertensive heart disease with heart failure: Secondary | ICD-10-CM | POA: Diagnosis not present

## 2017-11-25 DIAGNOSIS — Z6833 Body mass index (BMI) 33.0-33.9, adult: Secondary | ICD-10-CM | POA: Diagnosis not present

## 2017-11-25 DIAGNOSIS — Z8673 Personal history of transient ischemic attack (TIA), and cerebral infarction without residual deficits: Secondary | ICD-10-CM | POA: Diagnosis not present

## 2017-11-25 DIAGNOSIS — E785 Hyperlipidemia, unspecified: Secondary | ICD-10-CM | POA: Diagnosis present

## 2017-11-25 DIAGNOSIS — E669 Obesity, unspecified: Secondary | ICD-10-CM | POA: Diagnosis present

## 2017-11-25 DIAGNOSIS — I350 Nonrheumatic aortic (valve) stenosis: Secondary | ICD-10-CM | POA: Diagnosis not present

## 2017-11-25 DIAGNOSIS — Z8249 Family history of ischemic heart disease and other diseases of the circulatory system: Secondary | ICD-10-CM

## 2017-11-25 DIAGNOSIS — I69320 Aphasia following cerebral infarction: Secondary | ICD-10-CM | POA: Diagnosis not present

## 2017-11-25 DIAGNOSIS — Z91018 Allergy to other foods: Secondary | ICD-10-CM

## 2017-11-25 DIAGNOSIS — I712 Thoracic aortic aneurysm, without rupture: Secondary | ICD-10-CM | POA: Diagnosis not present

## 2017-11-25 DIAGNOSIS — I714 Abdominal aortic aneurysm, without rupture, unspecified: Secondary | ICD-10-CM | POA: Diagnosis present

## 2017-11-25 DIAGNOSIS — D696 Thrombocytopenia, unspecified: Secondary | ICD-10-CM | POA: Diagnosis present

## 2017-11-25 DIAGNOSIS — T82330A Leakage of aortic (bifurcation) graft (replacement), initial encounter: Secondary | ICD-10-CM | POA: Diagnosis not present

## 2017-11-25 DIAGNOSIS — Z888 Allergy status to other drugs, medicaments and biological substances status: Secondary | ICD-10-CM

## 2017-11-25 DIAGNOSIS — I5042 Chronic combined systolic (congestive) and diastolic (congestive) heart failure: Secondary | ICD-10-CM | POA: Diagnosis present

## 2017-11-25 DIAGNOSIS — I2510 Atherosclerotic heart disease of native coronary artery without angina pectoris: Secondary | ICD-10-CM | POA: Diagnosis present

## 2017-11-25 DIAGNOSIS — E119 Type 2 diabetes mellitus without complications: Secondary | ICD-10-CM | POA: Diagnosis not present

## 2017-11-25 DIAGNOSIS — K219 Gastro-esophageal reflux disease without esophagitis: Secondary | ICD-10-CM | POA: Diagnosis not present

## 2017-11-25 DIAGNOSIS — E7800 Pure hypercholesterolemia, unspecified: Secondary | ICD-10-CM | POA: Diagnosis not present

## 2017-11-25 DIAGNOSIS — Y713 Surgical instruments, materials and cardiovascular devices (including sutures) associated with adverse incidents: Secondary | ICD-10-CM | POA: Diagnosis not present

## 2017-11-25 HISTORY — PX: ABDOMINAL AORTIC ENDOVASCULAR STENT GRAFT: SHX5707

## 2017-11-25 LAB — BASIC METABOLIC PANEL
ANION GAP: 9 (ref 5–15)
BUN: 10 mg/dL (ref 8–23)
CHLORIDE: 99 mmol/L (ref 98–111)
CO2: 25 mmol/L (ref 22–32)
Calcium: 9 mg/dL (ref 8.9–10.3)
Creatinine, Ser: 0.89 mg/dL (ref 0.61–1.24)
GFR calc Af Amer: >60 mL/min (ref >60)
GFR calc non Af Amer: >60 mL/min (ref >60)
Glucose, Bld: 156 mg/dL — ABNORMAL HIGH (ref 70–99)
POTASSIUM: 4.3 mmol/L (ref 3.5–5.1)
Sodium: 133 mmol/L — ABNORMAL LOW (ref 135–145)

## 2017-11-25 LAB — CBC
HEMATOCRIT: 43.1 % (ref 39.0–52.0)
HEMOGLOBIN: 14.7 g/dL (ref 13.0–17.0)
MCH: 33 pg (ref 26.0–34.0)
MCHC: 34.1 g/dL (ref 30.0–36.0)
MCV: 96.6 fL (ref 78.0–100.0)
Platelets: 110 10*3/uL — ABNORMAL LOW (ref 150–400)
RBC: 4.46 MIL/uL (ref 4.22–5.81)
RDW: 12.7 % (ref 11.5–15.5)
WBC: 8.1 10*3/uL (ref 4.0–10.5)

## 2017-11-25 LAB — PROTIME-INR
INR: 1.08
Prothrombin Time: 13.9 seconds (ref 11.4–15.2)

## 2017-11-25 LAB — GLUCOSE, CAPILLARY
GLUCOSE-CAPILLARY: 97 mg/dL (ref 70–99)
GLUCOSE-CAPILLARY: 94 mg/dL (ref 70–99)
GLUCOSE-CAPILLARY: 134 mg/dL — AB (ref 70–99)
Glucose-Capillary: 111 mg/dL — ABNORMAL HIGH (ref 70–99)
Glucose-Capillary: 96 mg/dL (ref 70–99)
Glucose-Capillary: 129 mg/dL — ABNORMAL HIGH (ref 70–99)

## 2017-11-25 LAB — APTT: APTT: 37 s — AB (ref 24–36)

## 2017-11-25 LAB — POCT ACTIVATED CLOTTING TIME: ACTIVATED CLOTTING TIME: 230 s

## 2017-11-25 LAB — MAGNESIUM: MAGNESIUM: 1.9 mg/dL (ref 1.7–2.4)

## 2017-11-25 SURGERY — INSERTION, ENDOVASCULAR STENT GRAFT, AORTA, ABDOMINAL
Anesthesia: General | Site: Abdomen

## 2017-11-25 MED ORDER — ASPIRIN EC 81 MG PO TBEC
81.0000 mg | DELAYED_RELEASE_TABLET | Freq: Every day | ORAL | Status: DC
  Administered 2017-11-26: 81 mg via ORAL
  Filled 2017-11-25: qty 1

## 2017-11-25 MED ORDER — OXYCODONE HCL 5 MG PO TABS: 5.0000 mg | ORAL_TABLET | ORAL | Status: DC | PRN

## 2017-11-25 MED ORDER — PROPOFOL 10 MG/ML IV BOLUS
INTRAVENOUS | Status: AC
  Filled 2017-11-25: qty 20

## 2017-11-25 MED ORDER — ACETAMINOPHEN 325 MG PO TABS: 325.0000 mg | ORAL_TABLET | ORAL | Status: DC | PRN

## 2017-11-25 MED ORDER — LACTATED RINGERS IV SOLN
INTRAVENOUS | Status: DC | PRN
  Administered 2017-11-25: 08:00:00 via INTRAVENOUS

## 2017-11-25 MED ORDER — MIDAZOLAM HCL 2 MG/2ML IJ SOLN
INTRAMUSCULAR | Status: AC
  Filled 2017-11-25: qty 2

## 2017-11-25 MED ORDER — TACROLIMUS 0.1 % EX OINT: 1.0000 "application " | TOPICAL_OINTMENT | Freq: Every day | CUTANEOUS | Status: DC | PRN

## 2017-11-25 MED ORDER — HEPARIN SODIUM (PORCINE) 5000 UNIT/ML IJ SOLN
INTRAMUSCULAR | Status: AC
  Filled 2017-11-25: qty 1.2

## 2017-11-25 MED ORDER — SUGAMMADEX SODIUM 200 MG/2ML IV SOLN
INTRAVENOUS | Status: DC | PRN
  Administered 2017-11-25: 200 mg via INTRAVENOUS

## 2017-11-25 MED ORDER — CHLORHEXIDINE GLUCONATE 4 % EX LIQD: 60.0000 mL | Freq: Once | CUTANEOUS | Status: DC

## 2017-11-25 MED ORDER — HEPARIN SODIUM (PORCINE) 1000 UNIT/ML IJ SOLN
INTRAMUSCULAR | Status: AC
  Filled 2017-11-25: qty 1

## 2017-11-25 MED ORDER — ONDANSETRON HCL 4 MG/2ML IJ SOLN: 4.0000 mg | Freq: Four times a day (QID) | INTRAMUSCULAR | Status: DC | PRN

## 2017-11-25 MED ORDER — 0.9 % SODIUM CHLORIDE (POUR BTL) OPTIME
TOPICAL | Status: DC | PRN
  Administered 2017-11-25: 1000 mL

## 2017-11-25 MED ORDER — MEPERIDINE HCL 50 MG/ML IJ SOLN: 6.2500 mg | INTRAMUSCULAR | Status: DC | PRN

## 2017-11-25 MED ORDER — ROCURONIUM BROMIDE 100 MG/10ML IV SOLN
INTRAVENOUS | Status: DC | PRN
  Administered 2017-11-25: 20 mg via INTRAVENOUS
  Administered 2017-11-25: 10 mg via INTRAVENOUS
  Administered 2017-11-25: 50 mg via INTRAVENOUS

## 2017-11-25 MED ORDER — HEPARIN SODIUM (PORCINE) 1000 UNIT/ML IJ SOLN
INTRAMUSCULAR | Status: DC | PRN
  Administered 2017-11-25: 10000 [IU] via INTRAVENOUS
  Administered 2017-11-25: 1000 [IU] via INTRAVENOUS

## 2017-11-25 MED ORDER — MORPHINE SULFATE (PF) 2 MG/ML IV SOLN: 2.0000 mg | INTRAVENOUS | Status: DC | PRN

## 2017-11-25 MED ORDER — FENTANYL CITRATE (PF) 100 MCG/2ML IJ SOLN
INTRAMUSCULAR | Status: AC
  Administered 2017-11-25: 25 ug via INTRAVENOUS
  Filled 2017-11-25: qty 2

## 2017-11-25 MED ORDER — LIDOCAINE 2% (20 MG/ML) 5 ML SYRINGE
INTRAMUSCULAR | Status: AC
  Filled 2017-11-25: qty 5

## 2017-11-25 MED ORDER — PANTOPRAZOLE SODIUM 40 MG PO TBEC
40.0000 mg | DELAYED_RELEASE_TABLET | Freq: Every day | ORAL | Status: DC
  Administered 2017-11-26: 40 mg via ORAL
  Filled 2017-11-25: qty 1

## 2017-11-25 MED ORDER — DEXAMETHASONE SODIUM PHOSPHATE 10 MG/ML IJ SOLN
INTRAMUSCULAR | Status: AC
  Filled 2017-11-25: qty 1

## 2017-11-25 MED ORDER — SUGAMMADEX SODIUM 200 MG/2ML IV SOLN
INTRAVENOUS | Status: AC
  Filled 2017-11-25: qty 2

## 2017-11-25 MED ORDER — MAGNESIUM SULFATE 2 GM/50ML IV SOLN: 2.0000 g | Freq: Every day | INTRAVENOUS | Status: DC | PRN

## 2017-11-25 MED ORDER — CLEVIDIPINE BUTYRATE 0.5 MG/ML IV EMUL
1.0000 mg/h | INTRAVENOUS | Status: DC
  Administered 2017-11-25: 1 mg/h via INTRAVENOUS
  Filled 2017-11-25: qty 50

## 2017-11-25 MED ORDER — CEFAZOLIN SODIUM-DEXTROSE 2-4 GM/100ML-% IV SOLN
2.0000 g | INTRAVENOUS | Status: AC
  Administered 2017-11-25: 2 g via INTRAVENOUS

## 2017-11-25 MED ORDER — LIDOCAINE HCL (CARDIAC) PF 100 MG/5ML IV SOSY
PREFILLED_SYRINGE | INTRAVENOUS | Status: DC | PRN
  Administered 2017-11-25: 60 mg via INTRAVENOUS

## 2017-11-25 MED ORDER — PROTAMINE SULFATE 10 MG/ML IV SOLN
INTRAVENOUS | Status: AC
  Filled 2017-11-25: qty 5

## 2017-11-25 MED ORDER — DOCUSATE SODIUM 100 MG PO CAPS
100.0000 mg | ORAL_CAPSULE | Freq: Every day | ORAL | Status: DC
  Administered 2017-11-26: 100 mg via ORAL
  Filled 2017-11-25: qty 1

## 2017-11-25 MED ORDER — FENTANYL CITRATE (PF) 100 MCG/2ML IJ SOLN
25.0000 ug | INTRAMUSCULAR | Status: DC | PRN
  Administered 2017-11-25 (×2): 25 ug via INTRAVENOUS

## 2017-11-25 MED ORDER — FENTANYL CITRATE (PF) 100 MCG/2ML IJ SOLN
25.0000 ug | INTRAMUSCULAR | Status: DC | PRN
  Administered 2017-11-25: 50 ug via INTRAVENOUS

## 2017-11-25 MED ORDER — ALUM & MAG HYDROXIDE-SIMETH 200-200-20 MG/5ML PO SUSP: 15.0000 mL | ORAL | Status: DC | PRN

## 2017-11-25 MED ORDER — GLYCOPYRROLATE PF 0.2 MG/ML IJ SOSY
PREFILLED_SYRINGE | INTRAMUSCULAR | Status: AC
  Filled 2017-11-25: qty 1

## 2017-11-25 MED ORDER — HYDRALAZINE HCL 20 MG/ML IJ SOLN
5.0000 mg | INTRAMUSCULAR | Status: DC | PRN
  Administered 2017-11-25: 5 mg via INTRAVENOUS
  Filled 2017-11-25: qty 1

## 2017-11-25 MED ORDER — SODIUM CHLORIDE 0.9 % IV SOLN: 500.0000 mL | Freq: Once | INTRAVENOUS | Status: DC | PRN

## 2017-11-25 MED ORDER — INSULIN ASPART 100 UNIT/ML ~~LOC~~ SOLN: 0.0000 [IU] | Freq: Three times a day (TID) | SUBCUTANEOUS | Status: DC

## 2017-11-25 MED ORDER — ATORVASTATIN CALCIUM 80 MG PO TABS
80.0000 mg | ORAL_TABLET | Freq: Every day | ORAL | Status: DC
Start: 2017-11-25 — End: 2017-11-26
  Administered 2017-11-25: 80 mg via ORAL
  Filled 2017-11-25: qty 1

## 2017-11-25 MED ORDER — PROMETHAZINE HCL 25 MG/ML IJ SOLN: 6.2500 mg | INTRAMUSCULAR | Status: DC | PRN

## 2017-11-25 MED ORDER — PROPOFOL 10 MG/ML IV BOLUS
INTRAVENOUS | Status: DC | PRN
  Administered 2017-11-25: 50 mg via INTRAVENOUS
  Administered 2017-11-25: 150 mg via INTRAVENOUS

## 2017-11-25 MED ORDER — METOPROLOL TARTRATE 5 MG/5ML IV SOLN: 2.0000 mg | INTRAVENOUS | Status: DC | PRN

## 2017-11-25 MED ORDER — SODIUM CHLORIDE 0.9 % IV SOLN: INTRAVENOUS | Status: DC

## 2017-11-25 MED ORDER — FENTANYL CITRATE (PF) 100 MCG/2ML IJ SOLN
INTRAMUSCULAR | Status: DC | PRN
  Administered 2017-11-25: 100 ug via INTRAVENOUS
  Administered 2017-11-25: 50 ug via INTRAVENOUS

## 2017-11-25 MED ORDER — FENTANYL CITRATE (PF) 250 MCG/5ML IJ SOLN
INTRAMUSCULAR | Status: AC
Start: 2017-11-25 — End: ?
  Filled 2017-11-25: qty 5

## 2017-11-25 MED ORDER — PHENYLEPHRINE HCL 10 MG/ML IJ SOLN
INTRAMUSCULAR | Status: DC | PRN
  Administered 2017-11-25: 20 ug/min via INTRAVENOUS
  Administered 2017-11-25: 50 ug/min via INTRAVENOUS

## 2017-11-25 MED ORDER — GUAIFENESIN-DM 100-10 MG/5ML PO SYRP: 15.0000 mL | ORAL_SOLUTION | ORAL | Status: DC | PRN

## 2017-11-25 MED ORDER — SODIUM CHLORIDE 0.9 % IV SOLN
INTRAVENOUS | Status: DC
  Administered 2017-11-25: 19:00:00 via INTRAVENOUS

## 2017-11-25 MED ORDER — SODIUM CHLORIDE 0.9 % IV SOLN
INTRAVENOUS | Status: DC | PRN
  Administered 2017-11-25: 500 mL

## 2017-11-25 MED ORDER — INSULIN DETEMIR 100 UNIT/ML ~~LOC~~ SOLN
20.0000 [IU] | Freq: Every morning | SUBCUTANEOUS | Status: DC
  Administered 2017-11-26: 20 [IU] via SUBCUTANEOUS
  Filled 2017-11-25: qty 0.2

## 2017-11-25 MED ORDER — CARVEDILOL 25 MG PO TABS
25.0000 mg | ORAL_TABLET | Freq: Two times a day (BID) | ORAL | Status: DC
  Administered 2017-11-25 – 2017-11-26 (×2): 25 mg via ORAL
  Filled 2017-11-25 (×2): qty 1

## 2017-11-25 MED ORDER — PHENOL 1.4 % MT LIQD: 1.0000 | OROMUCOSAL | Status: DC | PRN

## 2017-11-25 MED ORDER — ACETAMINOPHEN 325 MG RE SUPP: 325.0000 mg | RECTAL | Status: DC | PRN

## 2017-11-25 MED ORDER — LOSARTAN POTASSIUM 50 MG PO TABS
50.0000 mg | ORAL_TABLET | Freq: Every day | ORAL | Status: DC
Start: 2017-11-26 — End: 2017-11-26
  Administered 2017-11-26: 50 mg via ORAL
  Filled 2017-11-25: qty 1

## 2017-11-25 MED ORDER — ONDANSETRON HCL 4 MG/2ML IJ SOLN
INTRAMUSCULAR | Status: AC
  Filled 2017-11-25: qty 2

## 2017-11-25 MED ORDER — LACTATED RINGERS IV SOLN: INTRAVENOUS | Status: DC

## 2017-11-25 MED ORDER — MIDAZOLAM HCL 2 MG/2ML IJ SOLN
1.0000 mg | Freq: Once | INTRAMUSCULAR | Status: AC
  Administered 2017-11-25: 1 mg via INTRAVENOUS

## 2017-11-25 MED ORDER — POTASSIUM CHLORIDE CRYS ER 20 MEQ PO TBCR: 20.0000 meq | EXTENDED_RELEASE_TABLET | Freq: Every day | ORAL | Status: DC | PRN

## 2017-11-25 MED ORDER — CEFAZOLIN SODIUM-DEXTROSE 2-4 GM/100ML-% IV SOLN
INTRAVENOUS | Status: AC
  Filled 2017-11-25: qty 100

## 2017-11-25 MED ORDER — IODIXANOL 320 MG/ML IV SOLN
INTRAVENOUS | Status: DC | PRN
  Administered 2017-11-25: 58 mL via INTRAVENOUS

## 2017-11-25 MED ORDER — DEXAMETHASONE SODIUM PHOSPHATE 10 MG/ML IJ SOLN
INTRAMUSCULAR | Status: DC | PRN
  Administered 2017-11-25: 10 mg via INTRAVENOUS

## 2017-11-25 MED ORDER — FENTANYL CITRATE (PF) 250 MCG/5ML IJ SOLN
INTRAMUSCULAR | Status: AC
  Filled 2017-11-25: qty 5

## 2017-11-25 MED ORDER — CEFAZOLIN SODIUM-DEXTROSE 2-4 GM/100ML-% IV SOLN
2.0000 g | Freq: Three times a day (TID) | INTRAVENOUS | Status: AC
  Administered 2017-11-25 – 2017-11-26 (×2): 2 g via INTRAVENOUS
  Filled 2017-11-25 (×2): qty 100

## 2017-11-25 MED ORDER — MIDAZOLAM HCL 2 MG/2ML IJ SOLN
INTRAMUSCULAR | Status: AC
  Administered 2017-11-25: 1 mg
  Filled 2017-11-25: qty 2

## 2017-11-25 MED ORDER — PROTAMINE SULFATE 10 MG/ML IV SOLN
INTRAVENOUS | Status: DC | PRN
  Administered 2017-11-25 (×2): 20 mg via INTRAVENOUS
  Administered 2017-11-25: 10 mg via INTRAVENOUS

## 2017-11-25 MED ORDER — NITROGLYCERIN 0.4 MG SL SUBL: 0.4000 mg | SUBLINGUAL_TABLET | SUBLINGUAL | Status: DC | PRN

## 2017-11-25 MED ORDER — LABETALOL HCL 5 MG/ML IV SOLN: 10.0000 mg | INTRAVENOUS | Status: DC | PRN

## 2017-11-25 SURGICAL SUPPLY — 73 items
ADH SKN CLS APL DERMABOND .7 (GAUZE/BANDAGES/DRESSINGS) ×2
BLADE CLIPPER SURG (BLADE) ×3 IMPLANT
CANISTER SUCT 3000ML PPV (MISCELLANEOUS) ×3 IMPLANT
CATH ANGIO 5F BER2 65CM (CATHETERS) ×2 IMPLANT
CATH BEACON 5 .035 65 VANSC4 (CATHETERS) ×2 IMPLANT
CATH BEACON 5.038 65CM KMP-01 (CATHETERS) IMPLANT
CATH HEADHUNTER H1 5F 100CM (CATHETERS) ×2 IMPLANT
CATH OMNI FLUSH .035X70CM (CATHETERS) ×2 IMPLANT
CATH VANSCH 5FR 6CM (CATHETERS) ×2 IMPLANT
COVER PROBE W GEL 5X96 (DRAPES) ×3 IMPLANT
DERMABOND ADVANCED (GAUZE/BANDAGES/DRESSINGS) ×4
DERMABOND ADVANCED .7 DNX12 (GAUZE/BANDAGES/DRESSINGS) ×1 IMPLANT
DEVICE CLOSURE PERCLS PRGLD 6F (VASCULAR PRODUCTS) IMPLANT
DEVICE TORQUE H2O (MISCELLANEOUS) ×2 IMPLANT
DRAPE ZERO GRAVITY STERILE (DRAPES) ×3 IMPLANT
DRSG EMULSION OIL 3X3 NADH (GAUZE/BANDAGES/DRESSINGS) ×4 IMPLANT
DRSG TEGADERM 2-3/8X2-3/4 SM (GAUZE/BANDAGES/DRESSINGS) ×4 IMPLANT
DRSG TEGADERM 4X4.75 (GAUZE/BANDAGES/DRESSINGS) ×4 IMPLANT
DRYSEAL FLEXSHEATH 12FR 33CM (SHEATH) ×2
DRYSEAL FLEXSHEATH 18FR 33CM (SHEATH) ×2
ELECT CAUTERY BLADE 6.4 (BLADE) ×3 IMPLANT
ELECT REM PT RETURN 9FT ADLT (ELECTROSURGICAL) ×6
ELECTRODE REM PT RTRN 9FT ADLT (ELECTROSURGICAL) ×2 IMPLANT
EXCLUDER AORTIC EXTEND 32X4.5 (Vascular Products) ×2 IMPLANT
EXCLUDER TNK LEG 31MX14X15 (Endovascular Graft) IMPLANT
EXCLUDER TRUNK LEG 31MX14X15 (Endovascular Graft) ×3 IMPLANT
FILTER CO2 0.2 MICRON (VASCULAR PRODUCTS) IMPLANT
FILTER CO2 INSUFFLATOR AX1008 (MISCELLANEOUS) IMPLANT
GAUZE SPONGE 2X2 8PLY STRL LF (GAUZE/BANDAGES/DRESSINGS) ×2 IMPLANT
GLOVE BIOGEL PI IND STRL 7.5 (GLOVE) ×1 IMPLANT
GLOVE BIOGEL PI INDICATOR 7.5 (GLOVE) ×2
GLOVE SURG SS PI 7.5 STRL IVOR (GLOVE) ×3 IMPLANT
GOWN STRL REUS W/ TWL LRG LVL3 (GOWN DISPOSABLE) ×2 IMPLANT
GOWN STRL REUS W/ TWL XL LVL3 (GOWN DISPOSABLE) ×1 IMPLANT
GOWN STRL REUS W/TWL LRG LVL3 (GOWN DISPOSABLE) ×6
GOWN STRL REUS W/TWL XL LVL3 (GOWN DISPOSABLE) ×3
GRAFT BALLN CATH 65CM (STENTS) IMPLANT
GUIDEWIRE ANGLED .035X150CM (WIRE) ×2 IMPLANT
GUIDEWIRE ANGLED .035X260CM (WIRE) ×2 IMPLANT
HEMOSTAT SNOW SURGICEL 2X4 (HEMOSTASIS) IMPLANT
KIT BASIN OR (CUSTOM PROCEDURE TRAY) ×3 IMPLANT
KIT TURNOVER KIT B (KITS) ×3 IMPLANT
LEG CONTRALATERAL 16X14.5X12 (Vascular Products) ×2 IMPLANT
NDL PERC 18GX7CM (NEEDLE) ×1 IMPLANT
NEEDLE PERC 18GX7CM (NEEDLE) ×3 IMPLANT
NS IRRIG 1000ML POUR BTL (IV SOLUTION) ×3 IMPLANT
PACK ENDOVASCULAR (PACKS) ×3 IMPLANT
PAD ARMBOARD 7.5X6 YLW CONV (MISCELLANEOUS) ×6 IMPLANT
PENCIL BUTTON HOLSTER BLD 10FT (ELECTRODE) ×3 IMPLANT
PERCLOSE PROGLIDE 6F (VASCULAR PRODUCTS) ×12
SET FLUSH CO2 (MISCELLANEOUS) IMPLANT
SHEATH AVANTI 11CM 8FR (SHEATH) ×2 IMPLANT
SHEATH BRITE TIP 8FR 23CM (SHEATH) ×2 IMPLANT
SHEATH DRYSEAL FLEX 12FR 33CM (SHEATH) IMPLANT
SHEATH DRYSEAL FLEX 18FR 33CM (SHEATH) IMPLANT
SHIELD RADPAD SCOOP 12X17 (MISCELLANEOUS) ×6 IMPLANT
SPONGE GAUZE 2X2 STER 10/PKG (GAUZE/BANDAGES/DRESSINGS) ×2
STENT GRAFT BALLN CATH 65CM (STENTS)
STOPCOCK MORSE 400PSI 3WAY (MISCELLANEOUS) ×3 IMPLANT
SUT ETHILON 3 0 PS 1 (SUTURE) IMPLANT
SUT PROLENE 5 0 C 1 24 (SUTURE) IMPLANT
SUT VIC AB 2-0 CT1 27 (SUTURE)
SUT VIC AB 2-0 CT1 TAPERPNT 27 (SUTURE) IMPLANT
SUT VIC AB 3-0 SH 27 (SUTURE)
SUT VIC AB 3-0 SH 27X BRD (SUTURE) IMPLANT
SUT VIC AB 4-0 PS2 18 (SUTURE) ×4 IMPLANT
SUT VICRYL 4-0 PS2 18IN ABS (SUTURE) IMPLANT
SYR 30ML LL (SYRINGE) ×1 IMPLANT
TOWEL GREEN STERILE (TOWEL DISPOSABLE) ×3 IMPLANT
TRAY FOLEY MTR SLVR 16FR STAT (SET/KITS/TRAYS/PACK) ×3 IMPLANT
TUBING HIGH PRESSURE 120CM (CONNECTOR) ×3 IMPLANT
WIRE AMPLATZ SS-J .035X180CM (WIRE) ×4 IMPLANT
WIRE BENTSON .035X145CM (WIRE) ×4 IMPLANT

## 2017-11-25 NOTE — Op Note (Signed)
Patient name: Jose Beck MRN: [id number] DOB: May 10, 1946 Sex: male  11/25/2017 Pre-operative Diagnosis: AAA Post-operative diagnosis:  Same Surgeon:  Annamarie Major  Co-Surgeon:  Ruta Hinds Assistants:  Arlee Muslim Procedure:   #1: Endovascular repair of abdominal aortic aneurysm   #2: Proximal extension x1   #3: Ultrasound-guided bilateral common femoral artery access   #4: Abdominal aortogram Anesthesia: General Blood Loss: 100 cc Specimens: None  Findings: Type Ia endoleak after the initial repair which was resolved by placement of a proximal cough Devices used: Main body was primary right Gore 31 x 14 x 15.  Contralateral left was a Gore 14 x 12.  Proximal cuff was a Gouru 32 x 4  Indications: The patient has been followed for a infrarenal abdominal aortic aneurysm.  His most recent CT scan showed that it had increased to 5.7 cm.  He comes in today for repair.  Procedure:  The patient was identified in the holding area and taken to [place] [facility]  The patient was then placed supine on the table. general anesthesia was administered.  The patient was prepped and draped in the usual sterile fashion.  A time out was called and antibiotics were administered.  Ultrasound was used to evaluate bilateral common femoral arteries which were calcified but patent.  A #11 blade was used to make a skin nick bilaterally.  Bilateral common femoral arteries were then cannulated under ultrasound guidance with a micropuncture needle.  A 018 wire was advanced without resistance followed by placement of a micropuncture sheath.  The dilator and wire were removed and a Bentson wire was inserted.  An 8 [place] dilator was used to dilate the subcutaneous tract.  Pro-glide devices were deployed at the 11:00 and 1 o'clock position for pre-closure.  8 French sheaths were placed bilaterally.  Amplatz superstiff wires were placed bilaterally.  A 12 French sheath was inserted into the aorta from the left  side and a 18 French sheath was inserted into the aorta from the right side.  The patient was fully heparinized.  Heparin levels were adjusted and redosed based on ACT levels.  Next, a Omni Flush catheter was advanced up the left side.  The main body device was prepared on the back table.  This was a Gore 31 x 14 x 15 device.  It was advanced up the right side.  An abdominal aortogram was performed to locate the renal arteries.  The main body device was then deployed landing at the level of the left renal artery which was the lowest renal artery.  It was deployed down to the contralateral gate.  We had difficulty cannulating the gait however this was ultimately achieved using a Berenstein 2 catheter and a Glidewire.  The catheter was inserted into the main body and Amplatz Super Stiff wire was placed.  The Berenstein 2 catheter was removed and a Omni Flush catheter was inserted.  This was able to be freely rotated within the main body, confirming successful cannulation of the gait.  The image detector was rotated to a right anterior oblique position and a retrograde injection was performed to the sheath in the left groin located left hypogastric artery.  The contralateral limb was prepared on the back table.  This was a Gore 14 x 12 device.  It was inserted through the sheath and deployed landing at the level of the left hypogastric artery.  Next, the image detector was rotated to a left anterior oblique position and a  retrograde injection was performed through the sheath in the right leg, locating the right hypogastric artery.  The main body device was then deployed which landed just proximal to the right hypogastric artery.  Next, a MOB balloon was used to mold the proximal and distal attachment sites as well as device overlap.  An Omni Flush catheter was then inserted at the left leg to the level of the proximal graft and an abdominal aortogram was performed.  This showed continued patency of the renal arteries  as well as the hypogastric and external iliac arteries.  There did appear to be a type I a endoleak.  I then elected to place a proximal cough as there was a known calcific ring up near the renal arteries.  The image detector was rotated cranially to take out the parallax and a abdominal aortogram was performed, relocating the renal arteries.  I then inserted a proximal cough which was a Gore 32 x 4 device.  This was deployed proximally at the level of the left renal artery.  The MOB balloon was used to mold the cuff.  A final arteriogram was performed which showed resolution of the type I a endoleak.  At this point, the stiff wires were exchanged out for Bentson wires.  The sheaths were then removed and the arteriotomy sites were closed by securing the previously placed pro-glide devices.  The patient had palpable femoral pulses at this point.  Heparin was reversed with protamine.  Manual pressure was held on the groins for 5 minutes until they were hemostatic.  Cautery was used within the incisions for hemostasis of the subcutaneous tissue.  Dermabond was placed on the right groin and 4-0 Vicryl was used to reapproximate the skin nick in the left groin followed by Dermabond.  The patient had brisk Doppler signals at the feet.  He was then successfully extubated and taken to the recovery room in stable condition.   Disposition: To PACU stable   V. Annamarie Major, M.D. [facility] [place] Office: [phone] Pager:  [phone]

## 2017-11-25 NOTE — Transfer of Care (Signed)
Immediate Anesthesia Transfer of Care Note  Patient: Jose Beck  Procedure(s) Performed: ABDOMINAL AORTIC ENDOVASCULAR STENT GRAFT (N/A Abdomen)  Patient Location: PACU  Anesthesia Type:General  Level of Consciousness: awake, alert  and oriented  Airway & Oxygen Therapy: Patient Spontanous Breathing and Patient connected to nasal cannula oxygen  Post-op Assessment: Report given to RN, Post -op Vital signs reviewed and stable and Patient moving all extremities  Post vital signs: Reviewed and stable  Last Vitals:  Vitals Value Taken Time  BP 146/73 11/25/2017  4:34 PM  Temp    Pulse 67 11/25/2017  4:43 PM  Resp 17 11/25/2017  4:43 PM  SpO2 100 % 11/25/2017  4:43 PM  Vitals shown include unvalidated device data.  Last Pain:  Vitals:   11/25/17 0855  TempSrc:   PainSc: 0-No pain         Complications: No apparent anesthesia complications

## 2017-11-25 NOTE — Progress Notes (Signed)
Patient arrived to unit from PACU at approximately 1625 via 2 staff transport. Patient VS obtained. A Line to right radial connected and Zeroed out. CCMD notified of admission. Patient oriented to unit and room. CHG bath given. Patient tolerated well. Patient alert and oriented x 4 with delayed verbal response to questions. Patient continues lying flat position until 2000. Patient made aware. 2 groin access sites WNL with gauze dressing in place. Left at level 1, right groin at level 0. Left groin dressing with blood tinged smear noted. Bilateral radial and dorsalis pedis pulses +2. Lung sounds clear but slightly diminished bilaterally. IS provided and at bedside. No acute distress noted. Wife at bedside.

## 2017-11-25 NOTE — Anesthesia Procedure Notes (Signed)
Procedure Name: Intubation Date/Time: 11/25/2017 1:35 PM Performed by: Effie Berkshire, MD Pre-anesthesia Checklist: Patient identified, Emergency Drugs available, Suction available and Patient being monitored Patient Re-evaluated:Patient Re-evaluated prior to induction Oxygen Delivery Method: Circle System Utilized Preoxygenation: Pre-oxygenation with 100% oxygen Induction Type: IV induction Ventilation: Mask ventilation without difficulty Laryngoscope Size: Mac and 4 Grade View: Grade I Tube type: Oral Tube size: 7.5 mm Number of attempts: 1 Airway Equipment and Method: Stylet and Oral airway Placement Confirmation: ETT inserted through vocal cords under direct vision,  positive ETCO2 and breath sounds checked- equal and bilateral Secured at: 22 cm Tube secured with: Tape Dental Injury: Teeth and Oropharynx as per pre-operative assessment  Comments: Placed by Katharina Caper under supervision of MD and CRNA

## 2017-11-25 NOTE — Op Note (Signed)
Procedure: Endovascular repair of abdominal aortic aneurysm (Gore Excluder)  Preoperative diagnosis: Abdominal aortic aneurysm  Postoperative diagnosis: Same  Anesthesia: General  Co-surgeon: Annamarie Major, MD  Operative details: I was co-surgeon with Dr. Trula Slade for the above-mentioned endovascular repair of abdominal aortic aneurysm.  I was responsible for cannulation of the contralateral gate as well as assisting with access of both groins.  Cannulation the gait was somewhat tedious requiring several different catheters due to a pleat in the contralateral gait.  With some manipulation finally using a Berenstein 2 catheter we were able to selectively catheterize the gait and advance a guidewire up into the abdominal aorta and into the descending thoracic aorta.  The Omni Flush catheter was advanced over this and this was twirled within the main body of the graft confirm we were intraluminal.  The iliac limb was then selected and deployed in the usual fashion.  Remainder of the details of placement of the stent graft are dictated by Dr. Trula Slade.  At conclusion of the case the sheath were removed over a Bentson wire and Proglide's secured bilaterally with good hemostasis.  There is a good femoral pulse below these.  Guidewires were removed.  Hemostasis was achieved with direct pressure.  Ruta Hinds, MD [facility] [place] [facility]  Office: [phone] Pager: [phone]

## 2017-11-25 NOTE — Progress Notes (Signed)
Pt. Reports he broke a tooth last Thursday,right upper back of mouth.Wife states she let Dr. Trula Slade know and she notified the dentist , but was unable to get an appointment.  Pt. Denies pain.

## 2017-11-25 NOTE — Interval H&P Note (Signed)
History and Physical Interval Note:  11/25/2017 7:19 AM  Jose Beck  has presented today for surgery, with the diagnosis of ABDOMINAL AORTIC ANEURYSM  The various methods of treatment have been discussed with the patient and family. After consideration of risks, benefits and other options for treatment, the patient has consented to  Procedure(s): ABDOMINAL AORTIC ENDOVASCULAR STENT GRAFT (N/A) as a surgical intervention .  The patient's history has been reviewed, patient examined, no change in status, stable for surgery.  I have reviewed the patient's chart and labs.  Questions were answered to the patient's satisfaction.     Annamarie Major

## 2017-11-25 NOTE — Anesthesia Procedure Notes (Signed)
Arterial Line Insertion Start/End7/17/2019 8:15 AM, 11/25/2017 8:25 AM Performed by: Lance Coon, CRNA, CRNA  Patient location: Pre-op. Preanesthetic checklist: patient identified, IV checked, site marked, risks and benefits discussed, surgical consent, monitors and equipment checked, pre-op evaluation, timeout performed and anesthesia consent Lidocaine 1% used for infiltration Right, radial was placed Catheter size: 20 G Hand hygiene performed , maximum sterile barriers used  and Seldinger technique used  Attempts: 3 Procedure performed without using ultrasound guided technique. Following insertion, dressing applied and Biopatch. Post procedure assessment: normal  Patient tolerated the procedure well with no immediate complications.

## 2017-11-25 NOTE — Anesthesia Postprocedure Evaluation (Signed)
Anesthesia Post Note  Patient: Jose Beck  Procedure(s) Performed: ABDOMINAL AORTIC ENDOVASCULAR STENT GRAFT (N/A Abdomen)     Patient location during evaluation: PACU Anesthesia Type: General Level of consciousness: awake and alert Pain management: pain level controlled Vital Signs Assessment: post-procedure vital signs reviewed and stable Respiratory status: spontaneous breathing, nonlabored ventilation, respiratory function stable and patient connected to nasal cannula oxygen Cardiovascular status: blood pressure returned to baseline and stable Postop Assessment: no apparent nausea or vomiting Anesthetic complications: no    Last Vitals:  Vitals:   11/25/17 1748 11/25/17 1749  BP:  (!) 142/72  Pulse:  75  Resp: 12   Temp:    SpO2:  100%    Last Pain:  Vitals:   11/25/17 1745  TempSrc:   PainSc: 0-No pain                 Effie Berkshire

## 2017-11-26 ENCOUNTER — Encounter (HOSPITAL_COMMUNITY): Payer: Self-pay | Admitting: Surgery

## 2017-11-26 LAB — CBC
HCT: 38.4 % — ABNORMAL LOW (ref 39.0–52.0)
Hemoglobin: 13.4 g/dL (ref 13.0–17.0)
MCH: 33.3 pg (ref 26.0–34.0)
MCHC: 34.9 g/dL (ref 30.0–36.0)
MCV: 95.5 fL (ref 78.0–100.0)
PLATELETS: 94 10*3/uL — AB (ref 150–400)
RBC: 4.02 MIL/uL — ABNORMAL LOW (ref 4.22–5.81)
RDW: 12.7 % (ref 11.5–15.5)
WBC: 7.2 10*3/uL (ref 4.0–10.5)

## 2017-11-26 LAB — GLUCOSE, CAPILLARY
GLUCOSE-CAPILLARY: 136 mg/dL — AB (ref 70–99)
Glucose-Capillary: 129 mg/dL — ABNORMAL HIGH (ref 70–99)

## 2017-11-26 LAB — TYPE AND SCREEN
ABO/RH(D): O NEG
Antibody Screen: POSITIVE

## 2017-11-26 LAB — BASIC METABOLIC PANEL
Anion gap: 9 (ref 5–15)
BUN: 11 mg/dL (ref 8–23)
CALCIUM: 8.2 mg/dL — AB (ref 8.9–10.3)
CO2: 22 mmol/L (ref 22–32)
CREATININE: 0.91 mg/dL (ref 0.61–1.24)
Chloride: 99 mmol/L (ref 98–111)
GFR calc Af Amer: >60 mL/min (ref >60)
GLUCOSE: 132 mg/dL — AB (ref 70–99)
Potassium: 4.6 mmol/L (ref 3.5–5.1)
Sodium: 130 mmol/L — ABNORMAL LOW (ref 135–145)

## 2017-11-26 LAB — POCT ACTIVATED CLOTTING TIME: Activated Clotting Time: 230 seconds

## 2017-11-26 MED ORDER — OXYCODONE HCL 5 MG PO TABS: 5.0000 mg | ORAL_TABLET | Freq: Four times a day (QID) | ORAL | 0 refills | Status: DC | PRN

## 2017-11-26 NOTE — Progress Notes (Signed)
Discharge order received. Patient and spouse made aware. 2 Peripheral IV sites (1 to left forearm and 1 to right hand) removed with catheter tip intact. No bleeding noted. Telemetry removed. CCMD notified. Patient discharge instructions reviewed to include medications, follow up appointments, self care, and prescriptions (Oxycodone) given to patient and spouse. All questions answered to patient satisfaction. Patient in no acute distress upon discharge.

## 2017-11-26 NOTE — Discharge Instructions (Signed)
   [facility] [place]   Discharge Instructions  Endovascular Aortic Aneurysm Repair  Please refer to the following instructions for your post-procedure care. Your surgeon or Physician Assistant will discuss any changes with you.  Activity  You are encouraged to walk as much as you can. You can slowly return to normal activities but must avoid strenuous activity and heavy lifting until your doctor tells you it's OK. Avoid activities such as vacuuming or swinging a gold club. It is normal to feel tired for several weeks after your surgery. Do not drive until your doctor gives the OK and you are no longer taking prescription pain medications. It is also normal to have difficulty with sleep habits, eating, and bowel movements after surgery. These will go away with time.  Bathing/Showering  You may shower after you go home. If you have an incision, do not soak in a bathtub, hot tub, or swim until the incision heals completely.  Incision Care  Shower every day. Clean your incision with mild soap and water. Pat the area dry with a clean towel. You do not need a bandage unless otherwise instructed. Do not apply any ointments or creams to your incision. If you clothing is irritating, you may cover your incision with a dry gauze pad.  Diet  Resume your normal diet. There are no special food restrictions following this procedure. A low fat/low cholesterol diet is recommended for all patients with vascular disease. In order to heal from your surgery, it is CRITICAL to get adequate nutrition. Your body requires vitamins, minerals, and protein. Vegetables are the best source of vitamins and minerals. Vegetables also provide the perfect balance of protein. Processed food has little nutritional value, so try to avoid this.  Medications  Resume taking all of your medications unless your doctor or nurse practitioner tells you not to. If your incision is causing pain, you may take  over-the-counter pain relievers such as acetaminophen (Tylenol). If you were prescribed a stronger pain medication, please be aware these medications can cause nausea and constipation. Prevent nausea by taking the medication with a snack or meal. Avoid constipation by drinking plenty of fluids and eating foods with a high amount of fiber, such as fruits, vegetables, and grains. Do not take Tylenol if you are taking prescription pain medications.   Follow up  Our office will schedule a follow-up appointment with a C.T. scan 3-4 weeks after your surgery.  Please call us immediately for any of the following conditions  Severe or worsening pain in your legs or feet or in your abdomen back or chest. Increased pain, redness, drainage (pus) from your incision sit. Increased abdominal pain, bloating, nausea, vomiting or persistent diarrhea. Fever of 101 degrees or higher. Swelling in your leg (s),  Reduce your risk of vascular disease  Stop smoking. If you would like help call [facility] at 1-800-QUIT-NOW (1-800-784-8669) or [place] at 336-586-4000. Manage your cholesterol Maintain a desired weight Control your diabetes Keep your blood pressure down  If you have questions, please call the office at [phone].   

## 2017-11-26 NOTE — Discharge Summary (Signed)
[facility] Discharge Summary   Jose Beck 11/03/1945 72 y.o. male  MRN: [id number]  Admission Date: 11/25/2017  Discharge Date: 11/26/17  Physician: Dr. Trula Slade  Admission Diagnosis: AAA (abdominal aortic aneurysm) [facility]) [I71.4]  Discharge Day services:   POD #1 patient is ambulating without difficulty, tolerating a home diet, and feeling fit for discharge home.  Only minimal soreness in bilateral groin catheterization sites.  He also denies any new or changing abdominal or back pain. Physical Exam: Vitals:   11/26/17 0727 11/26/17 0830  BP: (!) 169/98 140/80  Pulse: 98   Resp: 18   Temp: 98.5 F (36.9 C)   SpO2:     General: No apparent distress Cardiac: Regular rate and rhythm Lungs: Nonlabored respirations on room air Incisions: Bilateral groin catheterization sites without continued bleeding; right groin soft; left groin with minimal soreness to palpation and likely a small hematoma otherwise unremarkable Extremities: Palpable left DP pulse; palpable right PT pulse Abdomen: Bowel sounds are normoactive; abdomen is nontender and soft Neurologic: Patient is alert and oriented  Hospital Course:  The patient was admitted to the hospital and taken to the operating room on 11/25/2017 and underwent: Endovascular repair of abdominal aortic aneurysm with proximal extension x1 by Dr. Trula Slade on 11/25/2017.     The pt tolerated the procedure well and was transported to the PACU in good condition.   POD #1 patient was feeling fit for discharge home.  Physical examination can be seen in the above section.  Morning drawn labs did not demonstrate any significant drop in H&H nor do they demonstrate any change in renal function.  The remainder of the hospital course consisted of increasing mobilization and increasing intake of solids without difficulty.  He will be prescribed 1 to 2 days of narcotic pain medication.  He may resume all of his home medications.  Discharge  instructions were reviewed with the patient and he voices understanding.  He will follow-up in office in about 4 weeks with a CTA abdomen/pelvis.  He will be discharged this morning in stable condition as long as he is able to void now that Foley catheter has been removed.  CBC    Component Value Date/Time   WBC 7.2 11/26/2017 0500   RBC 4.02 (L) 11/26/2017 0500   HGB 13.4 11/26/2017 0500   HCT 38.4 (L) 11/26/2017 0500   PLT 94 (L) 11/26/2017 0500   MCV 95.5 11/26/2017 0500   MCH 33.3 11/26/2017 0500   MCHC 34.9 11/26/2017 0500   RDW 12.7 11/26/2017 0500   LYMPHSABS 0.8 (L) 08/13/2017 1422   MONOABS 0.4 08/13/2017 1422   EOSABS 0.1 08/13/2017 1422   BASOSABS 0.0 08/13/2017 1422    BMET    Component Value Date/Time   NA 130 (L) 11/26/2017 0500   K 4.6 11/26/2017 0500   CL 99 11/26/2017 0500   CO2 22 11/26/2017 0500   GLUCOSE 132 (H) 11/26/2017 0500   BUN 11 11/26/2017 0500   CREATININE 0.91 11/26/2017 0500   CALCIUM 8.2 (L) 11/26/2017 0500   GFRNONAA >60 11/26/2017 0500   GFRAA >60 11/26/2017 0500         Discharge Diagnosis:  AAA (abdominal aortic aneurysm) ([place]) [I71.4]  Secondary Diagnosis: Patient Active Problem List   Diagnosis Date Noted  . AAA (abdominal aortic aneurysm) ([place]) 11/25/2017  . History of stroke 01/15/2017  . Hyperlipidemia LDL goal <70 04/18/2016  . Hypertension   . Hypertensive heart disease   . Chest pain 11/16/2015  . Type  2 diabetes mellitus with circulatory disorder, with long-term current use of insulin ([place]) 11/16/2015  . Essential hypertension 11/16/2015  . Thrombocytopenia ([place]) 11/16/2015  . Leukopenia 11/16/2015  . Hyponatremia 11/16/2015  . Chronic combined systolic and diastolic CHF (congestive heart failure) ([place]) 11/16/2015  . CAD in native artery   . Brainstem infarct, acute ([place]) 09/17/2015  . 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 Obesity 09/17/2015  . Ataxia,  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  . Gait disturbance, 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  . History of CVA (cerebrovascular accident) 08/09/2015   Past Medical History:  Diagnosis Date  . Abdominal aortic aneurysm (AAA) ([place])   . Aortic stenosis    a. 11/2015 Echo: EF 55-60%, Gr1 DD, mild AS.  Marland Kitchen Chronic combined systolic and diastolic CHF (congestive heart failure) ([place])    a. 07/2015: TEE showing EF of 40% b. 11/2015: Echo w/ EF 55-60%, Grade 1 DD, mild AS.  Marland Kitchen Coronary artery disease    a. patient reports 100% stenosis of LAD and RCA by cath in 1999. b. 11/2015 Cath: LM nl, LAD 100 - fills by L->L collats from D1, LCX 60d, OM1 lalrge/nl, OM2 small/nl, RCA 156m, L->R collats.EF 35-45% (55-60% by echo).  . Diabetes mellitus without complication (HCC)    Type II  . GERD (gastroesophageal reflux disease)   . Hyperlipemia   . Hypertension   . Hypertensive heart disease   . Stroke [facility])    a. 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 Thrombocytopenia ([place]) 11/2015     Allergies as of 11/26/2017      Reactions   Cheese Nausea And Vomiting   Pravastatin Anxiety   Anxiety attack      Medication List    TAKE these medications   aspirin EC 81 MG tablet Take 81 mg by mouth daily.   atorvastatin 80 MG tablet Commonly known as:  LIPITOR Take 1 tablet (80 mg total) by mouth daily at 6 PM.   carvedilol 25 MG tablet Commonly known as:  COREG Take 1 tablet (25 mg total) by mouth 2 (two) times daily.   clopidogrel 75 MG tablet Commonly known as:  PLAVIX Take 1 tablet (75 mg total) by mouth daily.   LEVEMIR 100 UNIT/ML injection Generic drug:  insulin detemir Inject 20 Units into the skin every morning.   losartan 50 MG tablet Commonly known as:  COZAAR Take 1 tablet (50 mg total) by mouth daily.   nitroGLYCERIN 0.4 MG SL tablet Commonly known as:  NITROSTAT Place 0.4 mg under the tongue every 5 (five) minutes as needed for chest pain (x 3 doses).   oxyCODONE 5 MG immediate release  tablet Commonly known as:  Oxy IR/ROXICODONE Take 1 tablet (5 mg total) by mouth every 6 (six) hours as needed for moderate pain.   tacrolimus 0.1 % ointment Commonly known as:  PROTOPIC Apply 1 application topically daily as needed (facial scaling).       Discharge Instructions: Discharge instructions were reviewed with the patient including limitations on lifting, driving.  Cleanliness of bilateral groin catheterization sites also discussed with the patient.  [facility]  Discharge Instructions Endovascular Aortic Aneurysm Repair  Please refer to the following instructions for your post-procedure care. Your surgeon or Physician Assistant will discuss any changes with you.  Activity  You are encouraged to walk as much as you can. You can slowly return to normal activities but must avoid strenuous activity and heavy lifting  until your doctor tells you it's OK. Avoid activities such as vacuuming or swinging a gold club. It is normal to feel tired for several weeks after your surgery. Do not drive until your doctor gives the OK and you are no longer taking prescription pain medications. It is also normal to have difficulty with sleep habits, eating, and bowel movements after surgery. These will go away with time.  Bathing/Showering  You may shower after you go home. If you have an incision, do not soak in a bathtub, hot tub, or swim until the incision heals completely.  Incision Care  Shower every day. Clean your incision with mild soap and water. Pat the area dry with a clean towel. You do not need a bandage unless otherwise instructed. Do not apply any ointments or creams to your incision. If you clothing is irritating, you may cover your incision with a dry gauze pad.  Diet  Resume your normal diet. There are no special food restrictions following this procedure. A low fat/low cholesterol diet is recommended for all patients with vascular disease. In  order to heal from your surgery, it is CRITICAL to get adequate nutrition. Your body requires vitamins, minerals, and protein. Vegetables are the best source of vitamins and minerals. Vegetables also provide the perfect balance of protein. Processed food has little nutritional value, so try to avoid this.  Medications  Resume taking all of your medications unless your doctor or Physician Assistnat tells you not to. If your incision is causing pain, you may take over-the-counter pain relievers such as acetaminophen (Tylenol). If you were prescribed a stronger pain medication, please be aware these medications can cause nausea and constipation. Prevent nausea by taking the medication with a snack or meal. Avoid constipation by drinking plenty of fluids and eating foods with a high amount of fiber, such as fruits, vegetables, and grains. Do not take Tylenol if you are taking prescription pain medications.   Follow up  [place] office will schedule a follow-up appointment with a C.T. scan 3-4 weeks after your surgery.  Please call us immediately for any of the following conditions  Severe or worsening pain in your legs or feet or in your abdomen back or chest. Increased pain, redness, drainage (pus) from your incision sit. Increased abdominal pain, bloating, nausea, vomiting or persistent diarrhea. Fever of 101 degrees or higher. Swelling in your leg (s),  Reduce your risk of vascular disease  .Stop smoking. If you would like help call [facility] at 1-800-QUIT-NOW 5174183316) or [place] at (817)[phone]. .Manage your cholesterol .Maintain a desired weight .Control your diabetes .Keep your blood pressure down  If you have questions, please call the office at [phone].   Disposition: Home  Patient's condition: is Good  Follow up: 1. Dr. Trula Slade in 4 weeks with CTA protocol   Dagoberto Ligas, PA-C [facility] [phone] 11/26/2017  1:48 PM   - For VQI  Registry use - Post-op:  Time to Extubation: [x]  In OR, [ ]  < 12 hrs, [ ]  12-24 hrs, [ ]  >=24 hrs Vasopressors Req. Post-op: No MI: No., [ ]  Troponin only, [ ]  EKG or Clinical New Arrhythmia: No CHF: No ICU Stay: 0 days Transfusion: No     If yes,  units given  Complications: Resp failure: No., [ ]  Pneumonia, [ ]  Ventilator Chg in renal function: No., [ ]  Inc. Cr > 0.5, [ ]  Temp. Dialysis,  [ ]  Permanent dialysis Leg ischemia: No., no Surgery needed, [ ]   Yes, Surgery needed,  [ ]  Amputation Bowel ischemia: No., [ ]  Medical Rx, [ ]  Surgical Rx Wound complication: No., [ ]  Superficial separation/infection, [ ]  Return to OR Return to OR: No  Return to OR for bleeding: No Stroke: No., [ ]  Minor, [ ]  Major  Discharge medications: Statin use:  Yes  ASA use:  Yes  Plavix use:  Yes  Beta blocker use:  Yes  ARB use:  Yes ACEI use:  No CCB use:  No

## 2017-11-26 NOTE — Plan of Care (Signed)
  Problem: Education: Goal: Knowledge of General Education information will improve Description Including pain rating scale, medication(s)/side effects and non-pharmacologic comfort measures Outcome: Progressing   Problem: Health Behavior/Discharge Planning: Goal: Ability to manage health-related needs will improve Outcome: Progressing   Problem: Clinical Measurements: Goal: Will remain free from infection Outcome: Progressing Goal: Respiratory complications will improve Outcome: Progressing Goal: Cardiovascular complication will be avoided Outcome: Progressing   Problem: Pain Managment: Goal: General experience of comfort will improve Outcome: Progressing

## 2017-11-27 LAB — TYPE AND SCREEN
ABO/RH(D): O NEG
Antibody Screen: POSITIVE
DAT, IgG: POSITIVE
UNIT DIVISION: 0
Unit division: 0

## 2017-11-27 LAB — BPAM RBC
BLOOD PRODUCT EXPIRATION DATE: 201908102359
Blood Product Expiration Date: 201908102359
ISSUE DATE / TIME: 201907101744
ISSUE DATE / TIME: 201907101744
UNIT TYPE AND RH: 9500
Unit Type and Rh: 9500

## 2017-11-30 ENCOUNTER — Telehealth: Payer: Self-pay | Admitting: *Deleted

## 2017-11-30 NOTE — Telephone Encounter (Signed)
-----   Message from Margy Clarks, LPN sent at 05/21/2109  9:18 AM EDT ----- Regarding: [facility] Mickel Baas, RN from [facility] called to let us know that this patient refused services.

## 2017-12-02 ENCOUNTER — Other Ambulatory Visit: Payer: Self-pay

## 2017-12-02 DIAGNOSIS — I714 Abdominal aortic aneurysm, without rupture, unspecified: Secondary | ICD-10-CM

## 2017-12-02 DIAGNOSIS — Z48812 Encounter for surgical aftercare following surgery on the circulatory system: Secondary | ICD-10-CM

## 2017-12-03 ENCOUNTER — Encounter: Payer: Self-pay | Admitting: Surgery

## 2017-12-04 ENCOUNTER — Telehealth: Payer: Self-pay | Admitting: Surgery

## 2017-12-04 NOTE — Telephone Encounter (Signed)
sch appt spk to pt wife 12/28/17 330pm CTA f/u MD

## 2017-12-13 ENCOUNTER — Encounter: Payer: Self-pay | Admitting: Cardiology

## 2017-12-23 DIAGNOSIS — D225 Melanocytic nevi of trunk: Secondary | ICD-10-CM | POA: Diagnosis not present

## 2017-12-23 DIAGNOSIS — L821 Other seborrheic keratosis: Secondary | ICD-10-CM | POA: Diagnosis not present

## 2017-12-23 DIAGNOSIS — Z85828 Personal history of other malignant neoplasm of skin: Secondary | ICD-10-CM | POA: Diagnosis not present

## 2017-12-23 DIAGNOSIS — D1801 Hemangioma of skin and subcutaneous tissue: Secondary | ICD-10-CM | POA: Diagnosis not present

## 2017-12-23 DIAGNOSIS — D2222 Melanocytic nevi of left ear and external auricular canal: Secondary | ICD-10-CM | POA: Diagnosis not present

## 2017-12-23 DIAGNOSIS — L570 Actinic keratosis: Secondary | ICD-10-CM | POA: Diagnosis not present

## 2017-12-23 DIAGNOSIS — L814 Other melanin hyperpigmentation: Secondary | ICD-10-CM | POA: Diagnosis not present

## 2017-12-23 DIAGNOSIS — Z86018 Personal history of other benign neoplasm: Secondary | ICD-10-CM | POA: Diagnosis not present

## 2017-12-28 ENCOUNTER — Encounter: Payer: Self-pay | Admitting: Surgery

## 2017-12-28 ENCOUNTER — Other Ambulatory Visit: Payer: Self-pay

## 2017-12-28 ENCOUNTER — Ambulatory Visit
Admission: RE | Admit: 2017-12-28 | Discharge: 2017-12-28 | Disposition: A | Discharge status: Home / Self Care | Payer: Medicare Other | Source: Ambulatory Visit | Attending: Surgery | Admitting: Surgery

## 2017-12-28 ENCOUNTER — Ambulatory Visit (INDEPENDENT_AMBULATORY_CARE_PROVIDER_SITE_OTHER): Payer: Medicare Other | Admitting: Surgery

## 2017-12-28 VITALS — BP 151/87 | HR 83 | Temp 97.0°F | Resp 18 | Ht 70.0 in | Wt 232.0 lb

## 2017-12-28 DIAGNOSIS — Z48812 Encounter for surgical aftercare following surgery on the circulatory system: Secondary | ICD-10-CM

## 2017-12-28 DIAGNOSIS — I714 Abdominal aortic aneurysm, without rupture, unspecified: Secondary | ICD-10-CM

## 2017-12-28 DIAGNOSIS — K449 Diaphragmatic hernia without obstruction or gangrene: Secondary | ICD-10-CM | POA: Diagnosis not present

## 2017-12-28 MED ORDER — IOPAMIDOL (ISOVUE-370) INJECTION 76%
75.0000 mL | Freq: Once | INTRAVENOUS | Status: AC | PRN
  Administered 2017-12-28: 75 mL via INTRAVENOUS

## 2017-12-28 NOTE — Progress Notes (Signed)
Patient name: Jose Beck MRN: [id number] DOB: 1945-10-07 Sex: male  REASON FOR VISIT:     post op  HISTORY OF PRESENT ILLNESS:   The patient is back today for follow up.  He is status post EVAR for a 5.7cm on 11-25-2017.   Patient has a history of coronary artery disease. He has known two-vessel occlusion with collaterals. He has never had a stent or bypass. He does have a history of stroke approximately a year and a half ago. 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 It occurred in [place]. The only imaging studies are a report from a CT scan that showed his carotid bifurcations were widely patent. His residual deficits are some right-sided weakness and speech difficulties.  The patient is on statin therapy for hypercholesterolemia. He is medically managed for hypertension with an ARB. He is a non-smoker.  CURRENT MEDICATIONS:    Current Outpatient Medications  Medication Sig Dispense Refill  . aspirin EC 81 MG tablet Take 81 mg by mouth daily.    Marland Kitchen atorvastatin (LIPITOR) 80 MG tablet Take 1 tablet (80 mg total) by mouth daily at 6 PM. 30 tablet 6  . clopidogrel (PLAVIX) 75 MG tablet Take 1 tablet (75 mg total) by mouth daily. 30 tablet 1  . insulin detemir (LEVEMIR) 100 UNIT/ML injection Inject 20 Units into the skin every morning.     Marland Kitchen losartan (COZAAR) 50 MG tablet Take 1 tablet (50 mg total) by mouth daily. 90 tablet 2  . nitroGLYCERIN (NITROSTAT) 0.4 MG SL tablet Place 0.4 mg under the tongue every 5 (five) minutes as needed for chest pain (x 3 doses).    . tacrolimus (PROTOPIC) 0.1 % ointment Apply 1 application topically daily as needed (facial scaling).    . carvedilol (COREG) 25 MG tablet Take 1 tablet (25 mg total) by mouth 2 (two) times daily. 60 tablet 1  . oxyCODONE (OXY IR/ROXICODONE) 5 MG immediate release tablet Take 1 tablet (5 mg total) by mouth every 6 (six) hours as needed for moderate pain. (Patient not  taking: Reported on 12/28/2017) 10 tablet 0   No current facility-administered medications for this visit.     REVIEW OF SYSTEMS:   [X]  denotes positive finding, [ ]  denotes negative finding Cardiac  Comments:  Chest pain or chest pressure:    Shortness of breath upon exertion:    Short of breath when lying flat:    Irregular heart rhythm:    Constitutional    Fever or chills:      PHYSICAL EXAM:   Vitals:   12/28/17 1518 12/28/17 1521  BP: (!) 155/92 (!) 151/87  Pulse: 81 83  Resp: 18   Temp: (!) 97 F (36.1 C)   TempSrc: Oral   SpO2: 99%   Weight: 232 lb (105.2 kg)   Height: 5\' 10"  (1.778 m)     GENERAL: The patient is a well-nourished male, in no acute distress. The vital signs are documented above. CARDIOVASCULAR: There is a regular rate and rhythm. PULMONARY: Non-labored respirations Extremities are warm and well-perfused.  Both groin sites have healed  STUDIES:   I have reviewed his CT scan which has not formally been read by radiology.  The stent graft appears to be in good position with exclusion of the aneurysm sac and no evidence of endoleak.   MEDICAL ISSUES:   Status post endovascular aneurysm repair.  Patient will follow-up in 6 months with an ultrasound of his aneurysm.  Annamarie Major,  MD [facility] [phone] Pager [phone]

## 2018-01-04 DIAGNOSIS — I639 Cerebral infarction, unspecified: Secondary | ICD-10-CM | POA: Insufficient documentation

## 2018-01-04 DIAGNOSIS — M48061 Spinal stenosis, lumbar region without neurogenic claudication: Secondary | ICD-10-CM | POA: Diagnosis not present

## 2018-01-04 DIAGNOSIS — S22000A Wedge compression fracture of unspecified thoracic vertebra, initial encounter for closed fracture: Secondary | ICD-10-CM | POA: Insufficient documentation

## 2018-01-18 DIAGNOSIS — R2681 Unsteadiness on feet: Secondary | ICD-10-CM | POA: Diagnosis not present

## 2018-01-18 DIAGNOSIS — M545 Low back pain: Secondary | ICD-10-CM | POA: Diagnosis not present

## 2018-01-18 DIAGNOSIS — M546 Pain in thoracic spine: Secondary | ICD-10-CM | POA: Diagnosis not present

## 2018-01-25 DIAGNOSIS — M545 Low back pain: Secondary | ICD-10-CM | POA: Diagnosis not present

## 2018-01-25 DIAGNOSIS — M546 Pain in thoracic spine: Secondary | ICD-10-CM | POA: Diagnosis not present

## 2018-01-25 DIAGNOSIS — R2681 Unsteadiness on feet: Secondary | ICD-10-CM | POA: Diagnosis not present

## 2018-01-28 DIAGNOSIS — M545 Low back pain: Secondary | ICD-10-CM | POA: Diagnosis not present

## 2018-01-28 DIAGNOSIS — R2681 Unsteadiness on feet: Secondary | ICD-10-CM | POA: Diagnosis not present

## 2018-01-28 DIAGNOSIS — M546 Pain in thoracic spine: Secondary | ICD-10-CM | POA: Diagnosis not present

## 2018-02-01 DIAGNOSIS — M546 Pain in thoracic spine: Secondary | ICD-10-CM | POA: Diagnosis not present

## 2018-02-01 DIAGNOSIS — R2681 Unsteadiness on feet: Secondary | ICD-10-CM | POA: Diagnosis not present

## 2018-02-01 DIAGNOSIS — M545 Low back pain: Secondary | ICD-10-CM | POA: Diagnosis not present

## 2018-02-04 DIAGNOSIS — R2681 Unsteadiness on feet: Secondary | ICD-10-CM | POA: Diagnosis not present

## 2018-02-04 DIAGNOSIS — M545 Low back pain: Secondary | ICD-10-CM | POA: Diagnosis not present

## 2018-02-04 DIAGNOSIS — M546 Pain in thoracic spine: Secondary | ICD-10-CM | POA: Diagnosis not present

## 2018-02-08 DIAGNOSIS — I69320 Aphasia following cerebral infarction: Secondary | ICD-10-CM | POA: Diagnosis not present

## 2018-02-08 DIAGNOSIS — D696 Thrombocytopenia, unspecified: Secondary | ICD-10-CM | POA: Diagnosis not present

## 2018-02-08 DIAGNOSIS — I714 Abdominal aortic aneurysm, without rupture: Secondary | ICD-10-CM | POA: Diagnosis not present

## 2018-02-08 DIAGNOSIS — I69959 Hemiplegia and hemiparesis following unspecified cerebrovascular disease affecting unspecified side: Secondary | ICD-10-CM | POA: Diagnosis not present

## 2018-02-08 DIAGNOSIS — M1990 Unspecified osteoarthritis, unspecified site: Secondary | ICD-10-CM | POA: Diagnosis not present

## 2018-02-08 DIAGNOSIS — M546 Pain in thoracic spine: Secondary | ICD-10-CM | POA: Diagnosis not present

## 2018-02-08 DIAGNOSIS — E1151 Type 2 diabetes mellitus with diabetic peripheral angiopathy without gangrene: Secondary | ICD-10-CM | POA: Diagnosis not present

## 2018-02-08 DIAGNOSIS — I10 Essential (primary) hypertension: Secondary | ICD-10-CM | POA: Diagnosis not present

## 2018-02-08 DIAGNOSIS — I2510 Atherosclerotic heart disease of native coronary artery without angina pectoris: Secondary | ICD-10-CM | POA: Diagnosis not present

## 2018-02-08 DIAGNOSIS — N401 Enlarged prostate with lower urinary tract symptoms: Secondary | ICD-10-CM | POA: Diagnosis not present

## 2018-02-08 DIAGNOSIS — Z23 Encounter for immunization: Secondary | ICD-10-CM | POA: Diagnosis not present

## 2018-02-08 DIAGNOSIS — E7849 Other hyperlipidemia: Secondary | ICD-10-CM | POA: Diagnosis not present

## 2018-02-08 DIAGNOSIS — R2681 Unsteadiness on feet: Secondary | ICD-10-CM | POA: Diagnosis not present

## 2018-02-08 DIAGNOSIS — M545 Low back pain: Secondary | ICD-10-CM | POA: Diagnosis not present

## 2018-02-08 DIAGNOSIS — R404 Transient alteration of awareness: Secondary | ICD-10-CM | POA: Diagnosis not present

## 2018-02-11 DIAGNOSIS — R2681 Unsteadiness on feet: Secondary | ICD-10-CM | POA: Diagnosis not present

## 2018-02-11 DIAGNOSIS — M545 Low back pain: Secondary | ICD-10-CM | POA: Diagnosis not present

## 2018-02-11 DIAGNOSIS — M546 Pain in thoracic spine: Secondary | ICD-10-CM | POA: Diagnosis not present

## 2018-02-15 DIAGNOSIS — M545 Low back pain: Secondary | ICD-10-CM | POA: Diagnosis not present

## 2018-02-15 DIAGNOSIS — M546 Pain in thoracic spine: Secondary | ICD-10-CM | POA: Diagnosis not present

## 2018-02-15 DIAGNOSIS — R2681 Unsteadiness on feet: Secondary | ICD-10-CM | POA: Diagnosis not present

## 2018-02-18 DIAGNOSIS — M546 Pain in thoracic spine: Secondary | ICD-10-CM | POA: Diagnosis not present

## 2018-02-18 DIAGNOSIS — R2681 Unsteadiness on feet: Secondary | ICD-10-CM | POA: Diagnosis not present

## 2018-02-18 DIAGNOSIS — M545 Low back pain: Secondary | ICD-10-CM | POA: Diagnosis not present

## 2018-02-26 DIAGNOSIS — M545 Low back pain: Secondary | ICD-10-CM | POA: Diagnosis not present

## 2018-02-26 DIAGNOSIS — M546 Pain in thoracic spine: Secondary | ICD-10-CM | POA: Diagnosis not present

## 2018-02-26 DIAGNOSIS — R2681 Unsteadiness on feet: Secondary | ICD-10-CM | POA: Diagnosis not present

## 2018-03-01 DIAGNOSIS — M546 Pain in thoracic spine: Secondary | ICD-10-CM | POA: Diagnosis not present

## 2018-03-01 DIAGNOSIS — R2681 Unsteadiness on feet: Secondary | ICD-10-CM | POA: Diagnosis not present

## 2018-03-01 DIAGNOSIS — M545 Low back pain: Secondary | ICD-10-CM | POA: Diagnosis not present

## 2018-03-04 DIAGNOSIS — M546 Pain in thoracic spine: Secondary | ICD-10-CM | POA: Diagnosis not present

## 2018-03-04 DIAGNOSIS — M545 Low back pain: Secondary | ICD-10-CM | POA: Diagnosis not present

## 2018-03-04 DIAGNOSIS — R2681 Unsteadiness on feet: Secondary | ICD-10-CM | POA: Diagnosis not present

## 2018-03-08 DIAGNOSIS — M546 Pain in thoracic spine: Secondary | ICD-10-CM | POA: Diagnosis not present

## 2018-03-08 DIAGNOSIS — R2681 Unsteadiness on feet: Secondary | ICD-10-CM | POA: Diagnosis not present

## 2018-03-08 DIAGNOSIS — M545 Low back pain: Secondary | ICD-10-CM | POA: Diagnosis not present

## 2018-03-11 DIAGNOSIS — M545 Low back pain: Secondary | ICD-10-CM | POA: Diagnosis not present

## 2018-03-11 DIAGNOSIS — R2681 Unsteadiness on feet: Secondary | ICD-10-CM | POA: Diagnosis not present

## 2018-03-11 DIAGNOSIS — M546 Pain in thoracic spine: Secondary | ICD-10-CM | POA: Diagnosis not present

## 2018-03-15 DIAGNOSIS — M4854XD Collapsed vertebra, not elsewhere classified, thoracic region, subsequent encounter for fracture with routine healing: Secondary | ICD-10-CM | POA: Diagnosis not present

## 2018-03-19 DIAGNOSIS — E119 Type 2 diabetes mellitus without complications: Secondary | ICD-10-CM | POA: Diagnosis not present

## 2018-04-20 NOTE — Progress Notes (Signed)
Cardiology Office Note    Date:  04/22/2018   ID:  Jose Beck, Jose Beck January 12, 1946, MRN [id number]  PCP:  [name], MD  Cardiologist: Dr. Martinique   Chief Complaint  Patient presents with  . Coronary Artery Disease  . Hypertension    History of Present Illness:    Jose Beck is a 72 y.o. male with past medical history of CAD (s/p known occlusion of LAD and RCA by cath in 1992 with cath in 2017 showing similar findings with collateral flow noted), chronic combined systolic and diastolic CHF, HTN, HLD, Type 2 DM, GERD, and prior CVA.  He had a fall with vertebral fracture in December managed conservatively. He had some increased garbled speech in March that resolved. MRI showed multiple chronic strokes but no acute changes. Prior  MRI demonstrated a 4.6 cm AAA. Seen by Dr. Trula Slade and US showed 3.6 cm AAA. CTA done this June showed increase size to 5.7 cm. He subsequently underwent endovascular repair on 11/25/17. Follow up CT in August showed good repair.   On follow up he reports he is doing well. Denies any chest pain, dyspnea, edema, palpitations, dizziness. BP at home ranges from 116-138 with mean of 125. Diastolic readings are normal. Weight does fluctuate. Currently up 8 lbs.    Past Medical History:  Diagnosis Date  . Abdominal aortic aneurysm (AAA) ([place])   . Aortic stenosis    a. 11/2015 Echo: EF 55-60%, Gr1 DD, mild AS.  Marland Kitchen Chronic combined systolic and diastolic CHF (congestive heart failure) ([place])    a. 07/2015: TEE showing EF of 40% b. 11/2015: Echo w/ EF 55-60%, Grade 1 DD, mild AS.  Marland Kitchen Coronary artery disease    a. patient reports 100% stenosis of LAD and RCA by cath in 1999. b. 11/2015 Cath: LM nl, LAD 100 - fills by L->L collats from D1, LCX 60d, OM1 lalrge/nl, OM2 small/nl, RCA 162m, L->R collats.EF 35-45% (55-60% by echo).  . Diabetes mellitus without complication (HCC)    Type II  . GERD (gastroesophageal reflux disease)   . Hyperlipemia   .  Hypertension   . Hypertensive heart disease   . Stroke [facility])    a. 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 Thrombocytopenia ([place]) 11/2015    Past Surgical History:  Procedure Laterality Date  . ABDOMINAL AORTIC ENDOVASCULAR STENT GRAFT N/A 11/25/2017   Procedure: ABDOMINAL AORTIC ENDOVASCULAR STENT GRAFT;  Surgeon: Serafina Mitchell, MD;  Location: [place];  Service: Vascular;  Laterality: N/A;  . CARDIAC CATHETERIZATION    . CARDIAC CATHETERIZATION N/A 11/16/2015   Procedure: Left Heart Cath and Coronary Angiography;  Surgeon: [name], MD;  Location: [place] [facility];  Service: Cardiovascular;  Laterality: N/A;  . MOLE REMOVAL    . TONSILLECTOMY      Current Medications: Outpatient Medications Prior to Visit  Medication Sig Dispense Refill  . atorvastatin (LIPITOR) 80 MG tablet Take 1 tablet (80 mg total) by mouth daily at 6 PM. 30 tablet 6  . clopidogrel (PLAVIX) 75 MG tablet Take 1 tablet (75 mg total) by mouth daily. 30 tablet 1  . insulin detemir (LEVEMIR) 100 UNIT/ML injection Inject 20 Units into the skin every morning.     Marland Kitchen losartan (COZAAR) 50 MG tablet Take 1 tablet (50 mg total) by mouth daily. 90 tablet 2  . nitroGLYCERIN (NITROSTAT) 0.4 MG SL tablet Place 0.4 mg under the tongue every 5 (five) minutes as needed  for chest pain (x 3 doses).    . tacrolimus (PROTOPIC) 0.1 % ointment Apply 1 application topically daily as needed (facial scaling).    Marland Kitchen oxyCODONE (OXY IR/ROXICODONE) 5 MG immediate release tablet Take 1 tablet (5 mg total) by mouth every 6 (six) hours as needed for moderate pain. 10 tablet 0  . carvedilol (COREG) 25 MG tablet Take 1 tablet (25 mg total) by mouth 2 (two) times daily. 60 tablet 1  . aspirin EC 81 MG tablet Take 81 mg by mouth daily.     No facility-administered medications prior to visit.      Allergies:   Cheese and Pravastatin   Social History   Socioeconomic History  . Marital status: Married    Spouse name: [name]  . Number of  children: 0  . Years of education: College  . Highest education level: Not on file  Occupational History  . Occupation: Retired   Scientific laboratory technician  . Financial resource strain: Not on file  . Food insecurity:    Worry: Not on file    Inability: Not on file  . Transportation needs:    Medical: Not on file    Non-medical: Not on file  Tobacco Use  . Smoking status: Never Smoker  . Smokeless tobacco: Never Used  Substance and Sexual Activity  . Alcohol use: Yes    Alcohol/week: 1.0 standard drinks    Types: 1 Shots of liquor per week    Comment: cocktails every day  . Drug use: No  . Sexual activity: Not on file  Lifestyle  . Physical activity:    Days per week: Not on file    Minutes per session: Not on file  . Stress: Not on file  Relationships  . Social connections:    Talks on phone: Not on file    Gets together: Not on file    Attends religious service: Not on file    Active member of club or organization: Not on file    Attends meetings of clubs or organizations: Not on file    Relationship status: Not on file  Other Topics Concern  . Not on file  Social History Narrative   Drinks coffee daily      Family History:  The patient's family history includes Heart disease in his mother; Hypertension in his mother.   Review of Systems:   Please see the history of present illness.     All other systems reviewed and are otherwise negative except as noted above.   Physical Exam:    VS:  BP (!) 144/82   Pulse 78   Ht 5\' 9"  (1.753 m)   Wt 240 lb (108.9 kg)   BMI 35.44 kg/m    GENERAL:  Well appearing obese WM in NAD. Walks with cane.  HEENT:  PERRL, EOMI, sclera are clear. Oropharynx is clear. NECK:  No jugular venous distention, carotid upstroke brisk and symmetric, no bruits, no thyromegaly or adenopathy LUNGS:  Clear to auscultation bilaterally CHEST:  Unremarkable HEART:  RRR,  PMI not displaced or sustained,S1 and S2 within normal limits, no S3, no S4: no  clicks, no rubs, no murmurs ABD:  Soft, nontender. BS +, no masses or bruits. No hepatomegaly, no splenomegaly EXT:  2 + pulses throughout, no edema, no cyanosis no clubbing SKIN:  Warm and dry.  No rashes NEURO:  Alert and oriented x 3. Cranial nerves II through XII intact. PSYCH:  Cognitively intact  Wt Readings from Last 3 Encounters:  04/22/18 240 lb (108.9 kg)  12/28/17 232 lb (105.2 kg)  11/19/17 234 lb 8 oz (106.4 kg)     Studies/Labs Reviewed:   EKG:  EKG is not ordered today.   Recent Labs: 11/18/2017: ALT 19 11/25/2017: Magnesium 1.9 11/26/2017: BUN 11; Creatinine, Ser 0.91; Hemoglobin 13.4; Platelets 94; Potassium 4.6; Sodium 130   Lipid Panel    Component Value Date/Time   CHOL 151 11/16/2015 0702   TRIG 57 11/16/2015 0702   HDL 46 11/16/2015 0702   CHOLHDL 3.3 11/16/2015 0702   VLDL 11 11/16/2015 0702   LDLCALC 94 11/16/2015 0702   Labs dated 01/21/17: cholesterol 144, triglycerides 69, HDL 54, LDL 76. Dated 10/07/17: A1c 5.2%.  Dated 11/26/17: plts 94K. Hgb 13.4. Chemistries normal. A1c 5.7%  Additional studies/ records that were reviewed today include:   Cardiac Catheterization: 11/2015 1. Mid RCA lesion, 100% stenosed. 2. Ost 1st Diag to 1st Diag lesion, 70% stenosed. 3. Mid LAD lesion, 100% stenosed. 4. Dist RCA lesion, 100% stenosed. 5. Dist Cx lesion, 60% stenosed.    Total occlusion of the proximal RCA. RCA is heavily calcified. RCA fills by collaterals from the circumflex coronary of the left system. The right coronary is large in distribution.  Total occlusion of the proximal to mid LAD after the origin of the first of perforator and first diagonal. Left to left collateral supply the LAD and a second diagonal. The first diagonal contains eccentric 50-70% narrowing.  Widely patent circumflex including a small ramus intermedius/first obtuse marginal, and a large branching second obtuse marginal. The distal circumflex beyond the second marginal  is small and contains 50-70% narrowing. The circumflex is the source of collaterals to the distal right coronary.  Mildly depressed LV function with estimated EF in the 35 to 40% range. LVEDP is mildly elevated. The inferior wall is hypokinetic.   RECOMMENDATIONS:  Images were reviewed with Dr. Martinique. The plan at this time is to continue medical therapy unless symptoms progress.  Aggressive risk factor modification.  Heart failure therapy as indicated.  Echocardiogram: 11/2015 Study Conclusions  - Left ventricle: The cavity size was normal. Systolic function was   normal. The estimated ejection fraction was in the range of 55%   to 60%. Wall motion was normal; there were no regional wall   motion abnormalities. There was an increased relative   contribution of atrial contraction to ventricular filling.   Doppler parameters are consistent with abnormal left ventricular   relaxation (grade 1 diastolic dysfunction). - Aortic valve: Moderately calcified annulus. Trileaflet. Moderate   diffuse thickening and calcification. There was mild stenosis.   Valve area (VTI): 2.17 cm^2. Valve area (Vmax): 1.88 cm^2. Valve   area (Vmean): 1.75 cm^2.   Assessment:    1. Coronary artery disease involving native coronary artery of native heart without angina pectoris   2. Abdominal aortic aneurysm without rupture ([place])   3. Bilateral carotid artery stenosis   4. History of stroke   5. Essential hypertension   6. Hypercholesterolemia   7. Chronic combined systolic and diastolic CHF (congestive heart failure) ([place])      Plan:   In order of problems listed above:  1. CAD - the patient has known occlusion of the LAD and RCA by cath in 1992 with repeat cath in 2017 showing similar findings with collateral flow noted.  - he denies any recent chest pain or dyspnea on exertion.  - continue Plavix, BB, and statin  therapy.   2. Chronic Combined Systolic and Diastolic CHF - cath in 63/1497  showed a reduced EF of 35-40% but EF was preserved at 55-60% by echo at that time.  - no evidence of volume overload.  - continue Coreg 25mg  BID and Losartan 50mg  daily.   3. HTN - BP mildly elevated some today but home readings look good.   4. HLD -  Last LDL 76.  -  Remains on high-dose Atorvastatin 80mg  daily.  5. Type 2 DM -  Followed by PCP. Hgb A1c was well-controlled at 5.7%  6. AAA -  S/p endovascular repair in July 2019.  Follow up in 6 months.    Signed,  Martinique, MD  04/22/2018 2:01 PM    [facility] [address], [place], [place] [address] Phone: [phone]

## 2018-04-22 ENCOUNTER — Ambulatory Visit (INDEPENDENT_AMBULATORY_CARE_PROVIDER_SITE_OTHER): Payer: Medicare Other | Admitting: Cardiology

## 2018-04-22 ENCOUNTER — Encounter: Payer: Self-pay | Admitting: Cardiology

## 2018-04-22 VITALS — BP 144/82 | HR 78 | Ht 69.0 in | Wt 240.0 lb

## 2018-04-22 DIAGNOSIS — I6523 Occlusion and stenosis of bilateral carotid arteries: Secondary | ICD-10-CM | POA: Diagnosis not present

## 2018-04-22 DIAGNOSIS — I714 Abdominal aortic aneurysm, without rupture, unspecified: Secondary | ICD-10-CM

## 2018-04-22 DIAGNOSIS — I63219 Cerebral infarction due to unspecified occlusion or stenosis of unspecified vertebral arteries: Secondary | ICD-10-CM

## 2018-04-22 DIAGNOSIS — I10 Essential (primary) hypertension: Secondary | ICD-10-CM

## 2018-04-22 DIAGNOSIS — E7800 Pure hypercholesterolemia, unspecified: Secondary | ICD-10-CM | POA: Diagnosis not present

## 2018-04-22 DIAGNOSIS — I2510 Atherosclerotic heart disease of native coronary artery without angina pectoris: Secondary | ICD-10-CM | POA: Diagnosis not present

## 2018-04-22 DIAGNOSIS — Z8673 Personal history of transient ischemic attack (TIA), and cerebral infarction without residual deficits: Secondary | ICD-10-CM | POA: Diagnosis not present

## 2018-04-22 DIAGNOSIS — I5042 Chronic combined systolic (congestive) and diastolic (congestive) heart failure: Secondary | ICD-10-CM

## 2018-05-14 ENCOUNTER — Other Ambulatory Visit: Payer: Self-pay | Admitting: *Deleted

## 2018-05-14 DIAGNOSIS — I714 Abdominal aortic aneurysm, without rupture, unspecified: Secondary | ICD-10-CM

## 2018-06-01 DIAGNOSIS — H2513 Age-related nuclear cataract, bilateral: Secondary | ICD-10-CM | POA: Diagnosis not present

## 2018-06-01 DIAGNOSIS — H2512 Age-related nuclear cataract, left eye: Secondary | ICD-10-CM | POA: Diagnosis not present

## 2018-06-01 DIAGNOSIS — H25013 Cortical age-related cataract, bilateral: Secondary | ICD-10-CM | POA: Diagnosis not present

## 2018-06-01 DIAGNOSIS — H18413 Arcus senilis, bilateral: Secondary | ICD-10-CM | POA: Diagnosis not present

## 2018-06-01 DIAGNOSIS — H25043 Posterior subcapsular polar age-related cataract, bilateral: Secondary | ICD-10-CM | POA: Diagnosis not present

## 2018-06-01 DIAGNOSIS — H02831 Dermatochalasis of right upper eyelid: Secondary | ICD-10-CM | POA: Diagnosis not present

## 2018-06-07 DIAGNOSIS — E7849 Other hyperlipidemia: Secondary | ICD-10-CM | POA: Diagnosis not present

## 2018-06-07 DIAGNOSIS — E1151 Type 2 diabetes mellitus with diabetic peripheral angiopathy without gangrene: Secondary | ICD-10-CM | POA: Diagnosis not present

## 2018-06-07 DIAGNOSIS — R82998 Other abnormal findings in urine: Secondary | ICD-10-CM | POA: Diagnosis not present

## 2018-06-07 DIAGNOSIS — I10 Essential (primary) hypertension: Secondary | ICD-10-CM | POA: Diagnosis not present

## 2018-06-07 DIAGNOSIS — Z125 Encounter for screening for malignant neoplasm of prostate: Secondary | ICD-10-CM | POA: Diagnosis not present

## 2018-06-14 DIAGNOSIS — E7849 Other hyperlipidemia: Secondary | ICD-10-CM | POA: Diagnosis not present

## 2018-06-14 DIAGNOSIS — D696 Thrombocytopenia, unspecified: Secondary | ICD-10-CM | POA: Diagnosis not present

## 2018-06-14 DIAGNOSIS — K7460 Unspecified cirrhosis of liver: Secondary | ICD-10-CM | POA: Diagnosis not present

## 2018-06-14 DIAGNOSIS — E1151 Type 2 diabetes mellitus with diabetic peripheral angiopathy without gangrene: Secondary | ICD-10-CM | POA: Diagnosis not present

## 2018-06-14 DIAGNOSIS — I10 Essential (primary) hypertension: Secondary | ICD-10-CM | POA: Diagnosis not present

## 2018-06-14 DIAGNOSIS — I69320 Aphasia following cerebral infarction: Secondary | ICD-10-CM | POA: Diagnosis not present

## 2018-06-14 DIAGNOSIS — N401 Enlarged prostate with lower urinary tract symptoms: Secondary | ICD-10-CM | POA: Diagnosis not present

## 2018-06-14 DIAGNOSIS — I714 Abdominal aortic aneurysm, without rupture: Secondary | ICD-10-CM | POA: Diagnosis not present

## 2018-06-14 DIAGNOSIS — I2510 Atherosclerotic heart disease of native coronary artery without angina pectoris: Secondary | ICD-10-CM | POA: Diagnosis not present

## 2018-06-14 DIAGNOSIS — R404 Transient alteration of awareness: Secondary | ICD-10-CM | POA: Diagnosis not present

## 2018-06-14 DIAGNOSIS — Z1331 Encounter for screening for depression: Secondary | ICD-10-CM | POA: Diagnosis not present

## 2018-06-14 DIAGNOSIS — Z0000 Encounter for general adult medical examination without abnormal findings: Secondary | ICD-10-CM | POA: Diagnosis not present

## 2018-06-28 ENCOUNTER — Ambulatory Visit (HOSPITAL_COMMUNITY)
Admission: RE | Admit: 2018-06-28 | Discharge: 2018-06-28 | Disposition: A | Discharge status: Home / Self Care | Payer: Medicare Other | Source: Ambulatory Visit | Attending: Surgery | Admitting: Surgery

## 2018-06-28 ENCOUNTER — Other Ambulatory Visit: Payer: Self-pay

## 2018-06-28 ENCOUNTER — Ambulatory Visit (INDEPENDENT_AMBULATORY_CARE_PROVIDER_SITE_OTHER): Payer: Medicare Other | Admitting: Surgery

## 2018-06-28 ENCOUNTER — Encounter: Payer: Self-pay | Admitting: Surgery

## 2018-06-28 VITALS — BP 158/88 | HR 63 | Temp 97.0°F | Resp 18 | Ht 68.0 in | Wt 240.7 lb

## 2018-06-28 DIAGNOSIS — I714 Abdominal aortic aneurysm, without rupture, unspecified: Secondary | ICD-10-CM

## 2018-06-28 NOTE — Progress Notes (Signed)
[facility] [place]  Patient name: Jose Beck MRN: [id number] DOB: [birth year] Sex: male   REASON FOR VISIT:    Follow up  [place]:    The patient is back today for follow up.  He is status post EVAR for a 5.7cm on 11-25-2017.  He has no complaints today.   Patient has a history of coronary artery disease. He has known two-vessel occlusion with collaterals. He has never had a stent or bypass. He does have a history of stroke approximately a year and a half ago. 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 It occurred in [place]. The only imaging studies are a report from a CT scan that showed his carotid bifurcations were widely patent. His residual deficits are some right-sided weakness and speech difficulties.  The patient is on statin therapy for hypercholesterolemia. He is medically managed for hypertension with an ARB. He is a non-smoker.   PAST MEDICAL HISTORY:   Past Medical History:  Diagnosis Date  . Abdominal aortic aneurysm (AAA) ([place])   . Aortic stenosis    a. 11/2015 Echo: EF 55-60%, Gr1 DD, mild AS.  Marland Kitchen Chronic combined systolic and diastolic CHF (congestive heart failure) ([place])    a. 07/2015: TEE showing EF of 40% b. 11/2015: Echo w/ EF 55-60%, Grade 1 DD, mild AS.  Marland Kitchen Coronary artery disease    a. patient reports 100% stenosis of LAD and RCA by cath in 1999. b. 11/2015 Cath: LM nl, LAD 100 - fills by L->L collats from D1, LCX 60d, OM1 lalrge/nl, OM2 small/nl, RCA 141m, L->R collats.EF 35-45% (55-60% by echo).  . Diabetes mellitus without complication (HCC)    Type II  . GERD (gastroesophageal reflux disease)   . Hyperlipemia   . Hypertension   . Hypertensive heart disease   . Stroke [facility])    a. 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 Thrombocytopenia ([place]) 11/2015     FAMILY HISTORY:   Family History  Problem Relation Age of Onset  . Hypertension Mother     . Heart disease Mother     SOCIAL HISTORY:   Social History   Tobacco Use  . Smoking status: Never Smoker  . Smokeless tobacco: Never Used  Substance Use Topics  . Alcohol use: Yes    Alcohol/week: 1.0 standard drinks    Types: 1 Shots of liquor per week    Comment: cocktails every day     ALLERGIES:   Allergies  Allergen Reactions  . Cheese Nausea And Vomiting  . Pravastatin Anxiety    Anxiety attack     CURRENT MEDICATIONS:   Current Outpatient Medications  Medication Sig Dispense Refill  . atorvastatin (LIPITOR) 80 MG tablet Take 1 tablet (80 mg total) by mouth daily at 6 PM. 30 tablet 6  . clopidogrel (PLAVIX) 75 MG tablet Take 1 tablet (75 mg total) by mouth daily. 30 tablet 1  . insulin detemir (LEVEMIR) 100 UNIT/ML injection Inject 20 Units into the skin every morning.     Marland Kitchen losartan (COZAAR) 50 MG tablet Take 1 tablet (50 mg total) by mouth daily. 90 tablet 2  . nitroGLYCERIN (NITROSTAT) 0.4 MG SL tablet Place 0.4 mg under the tongue every 5 (five) minutes as needed for chest pain (x 3 doses).    . tacrolimus (PROTOPIC) 0.1 % ointment Apply 1 application topically daily as needed (facial scaling).    . carvedilol (COREG) 25 MG tablet Take 1 tablet (  25 mg total) by mouth 2 (two) times daily. 60 tablet 1   No current facility-administered medications for this visit.     REVIEW OF SYSTEMS:   [X]  denotes positive finding, [ ]  denotes negative finding Cardiac  Comments:  Chest pain or chest pressure:    Shortness of breath upon exertion:    Short of breath when lying flat:    Irregular heart rhythm:        Vascular    Pain in calf, thigh, or hip brought on by ambulation:    Pain in feet at night that wakes you up from your sleep:     Blood clot in your veins:    Leg swelling:         Pulmonary    Oxygen at home:    Productive cough:     Wheezing:         Neurologic    Sudden weakness in arms or legs:     Sudden numbness in arms or legs:      Sudden onset of difficulty speaking or slurred speech:    Temporary loss of vision in one eye:     Problems with dizziness:         Gastrointestinal    Blood in stool:     Vomited blood:         Genitourinary    Burning when urinating:     Blood in urine:        Psychiatric    Major depression:         Hematologic    Bleeding problems:    Problems with blood clotting too easily:        Skin    Rashes or ulcers:        Constitutional    Fever or chills:      PHYSICAL EXAM:   Vitals:   06/28/18 0930  BP: (!) 158/88  Pulse: 63  Resp: 18  Temp: (!) 97 F (36.1 C)  TempSrc: Oral  SpO2: 100%  Weight: 240 lb 11.9 oz (109.2 kg)  Height: 5\' 8"  (1.727 m)    GENERAL: The patient is a well-nourished male, in no acute distress. The vital signs are documented above. CARDIAC: There is a regular rate and rhythm.  VASCULAR: I can not palpate pedal pulses today PULMONARY: Non-labored respirations ABDOMEN: Soft and non-tender.  No pulsatile mass MUSCULOSKELETAL: There are no major deformities or cyanosis. NEUROLOGIC: No focal weakness or paresthesias are detected. SKIN: There are no ulcers or rashes noted. PSYCHIATRIC: The patient has a normal affect.  STUDIES:   I have ordered and reviewed his ultrasound with the following findings: 06/28/2018 9:40 AM - Interface, Three One Seven   Narrative & Impression   Endovascular Aortic Repair Study (EVAR)  Indications: Follow up exam for EVAR.  Risk Factors: Hypertension, hyperlipidemia, Diabetes, no history of smoking,               prior CVA.  Vascular Interventions: EVAR 11/25/2017.  Limitations: Obesity.    Comparison Study: Prior CT 12/28/2017 showed a residual sac measuring 5.8 cm by                   3.6 cm.  Performing Technologist: Delorise Shiner RVT    Examination Guidelines: A complete evaluation includes B-mode imaging, spectral Doppler, color Doppler, and power Doppler as needed of all accessible  portions of each vessel. Bilateral testing is considered an integral part of a complete examination. Limited examinations  for reoccurring indications may be performed as noted.    Abdominal Aorta Findings: +--------+-------+----------+----------+--------+--------+--------+ LocationAP (cm)Trans (cm)PSV (cm/s)WaveformThrombusComments +--------+-------+----------+----------+--------+--------+--------+ Proximal2.22   2.27      83                                 +--------+-------+----------+----------+--------+--------+--------+ Mid                                                         +--------+-------+----------+----------+--------+--------+--------+  Endovascular Aortic Repair (EVAR): +----------+----------------+-------------------+-------------------+           Diameter AP (cm)Diameter Trans (cm)Velocities (cm/sec) +----------+----------------+-------------------+-------------------+ Aorta     4.62            3.62               92                  +----------+----------------+-------------------+-------------------+ Right Limb1.44            1.58               120                 +----------+----------------+-------------------+-------------------+ Left Limb 1.82            1.88               71                  +----------+----------------+-------------------+-------------------+  +-------------+-----------------------+ Endoleak TypeNo evidence of endoleak +-------------+-----------------------+     MEDICAL ISSUES:   AAA:  Max diameter now 4.6 cm without endoleak.  I will repeat his u/s imaging in 1 year    Annamarie Major, MD [facility] [phone] Pager [phone]

## 2018-07-05 DIAGNOSIS — H2512 Age-related nuclear cataract, left eye: Secondary | ICD-10-CM | POA: Diagnosis not present

## 2018-07-06 DIAGNOSIS — H2511 Age-related nuclear cataract, right eye: Secondary | ICD-10-CM | POA: Diagnosis not present

## 2018-07-19 DIAGNOSIS — H2511 Age-related nuclear cataract, right eye: Secondary | ICD-10-CM | POA: Diagnosis not present

## 2018-07-19 DIAGNOSIS — H2512 Age-related nuclear cataract, left eye: Secondary | ICD-10-CM | POA: Diagnosis not present

## 2018-07-19 DIAGNOSIS — H2589 Other age-related cataract: Secondary | ICD-10-CM | POA: Diagnosis not present

## 2018-08-15 IMAGING — CT CT ANGIO CHEST
2 of 7 series · 14 of 46 positions shown · IV contrast (iopamidol)
Comparison: None.

CLINICAL DATA: 71-year-old male with a history of abdominal aortic
aneurysm

EXAM:
CT ANGIOGRAPHY CHEST, ABDOMEN AND PELVIS
TECHNIQUE: Multidetector CT imaging through the chest, abdomen and pelvis was
performed using the standard protocol during bolus administration of
intravenous contrast. Multiplanar reconstructed images and MIPs were
obtained and reviewed to evaluate the vascular anatomy.
Creatinine was obtained on site at [HOSPITAL] at [REDACTED].
Results: Creatinine 1.0 mg/dL.
CONTRAST:  100mL TZE62V-JST IOPAMIDOL (TZE62V-JST) INJECTION 76%

[Series 5: cta arterial 2.00 bv36 s3 axial st · axial · arterial · 0.80mm/px · z∈[+1175,+1719]mm · 11 of 304 slices shown]
[im 16/304  lung]
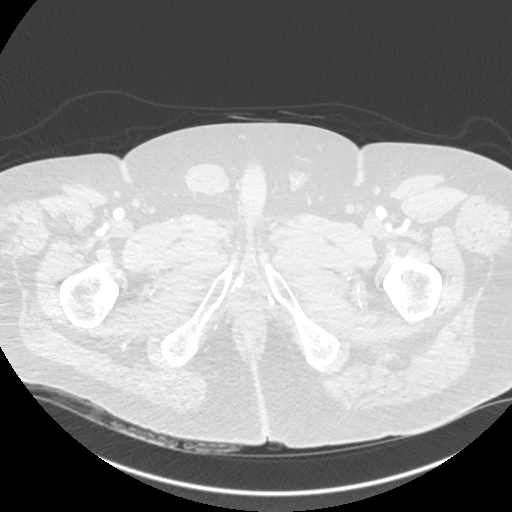
[im 46/304  soft-tissue]
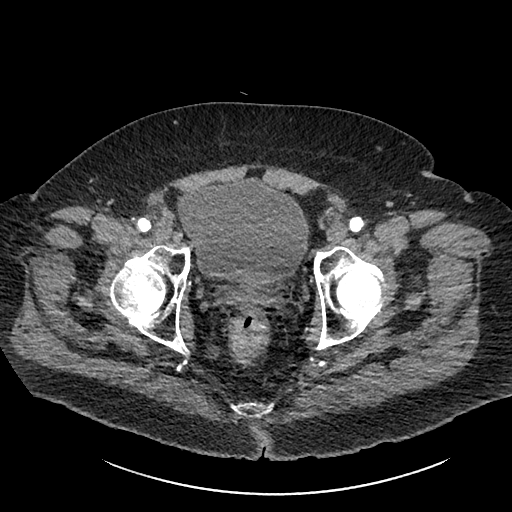
[im 76/304  lung]
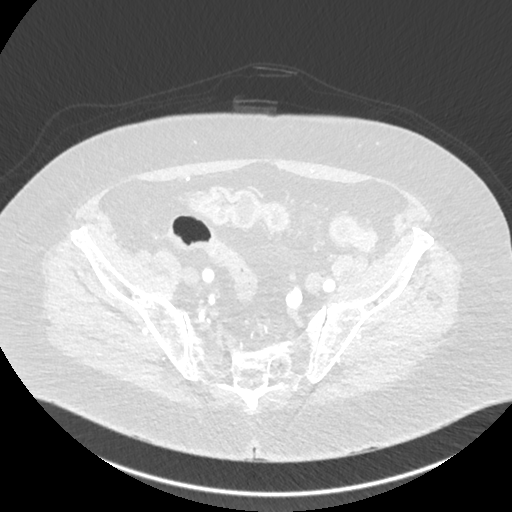
[im 107/304  soft-tissue]
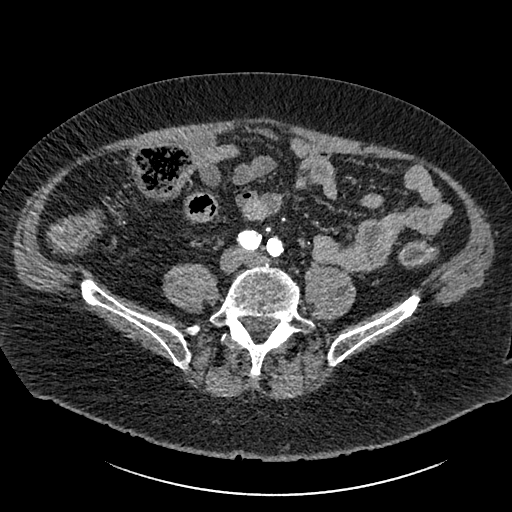
[im 122/304  lung]
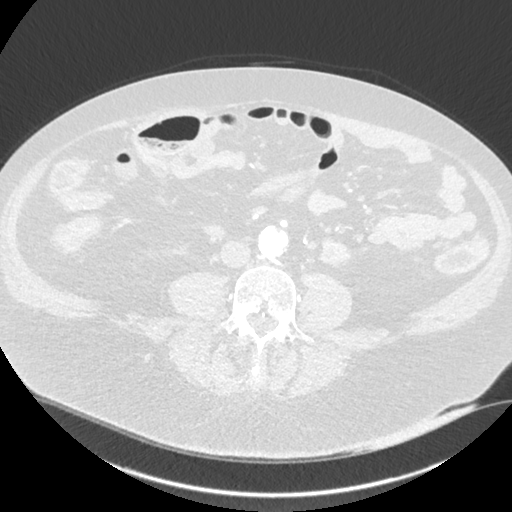
[im 152/304  soft-tissue]
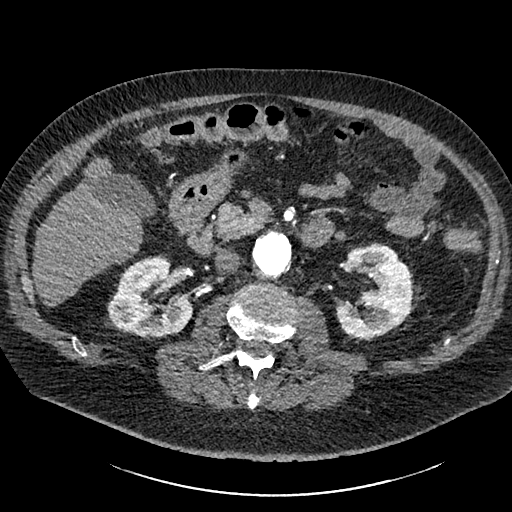
[im 182/304  lung]
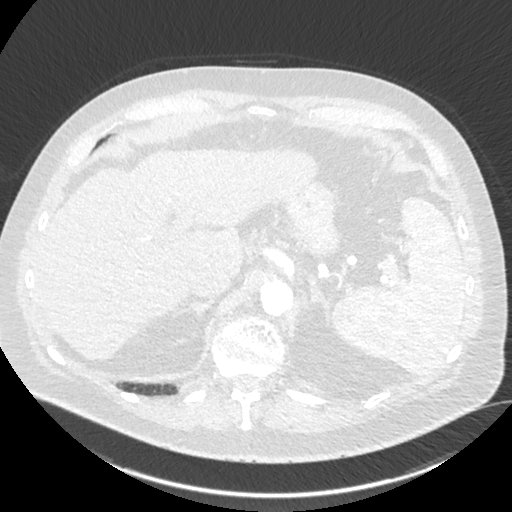
[im 197/304  soft-tissue]
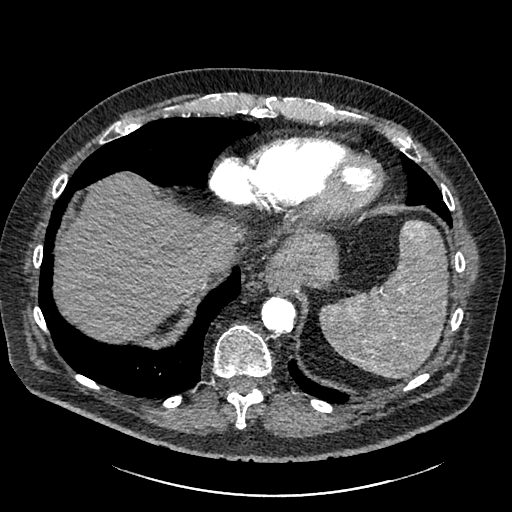
[im 228/304  lung]
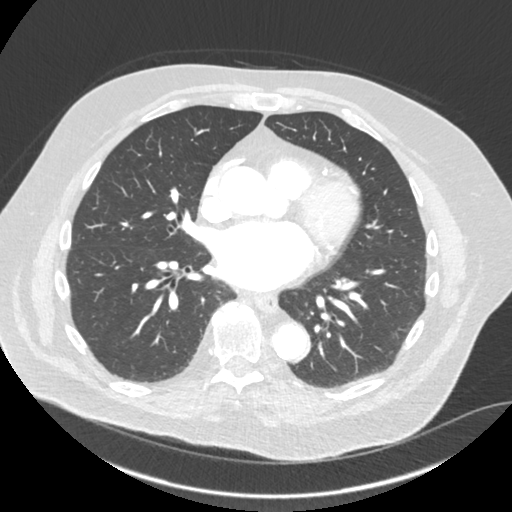
[im 258/304  soft-tissue]
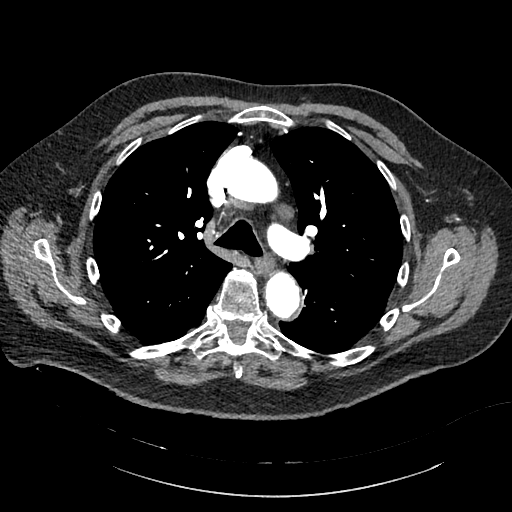
[im 288/304  lung]
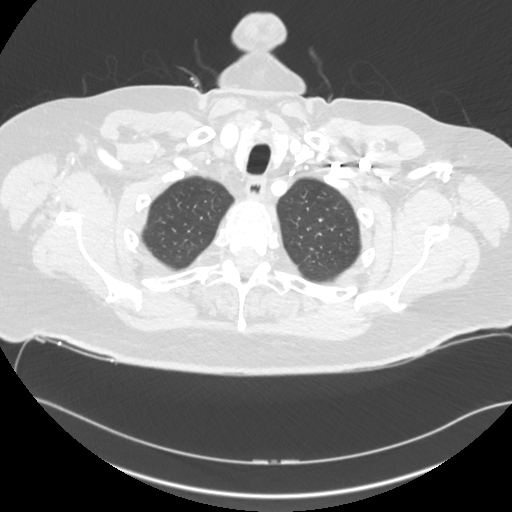

[Series 8: cta arterial 2.00 bv36 s3 cor cor art st · coronal · arterial · 0.83mm/px · 3 of 212 slices shown]
[im 53/212  soft-tissue]
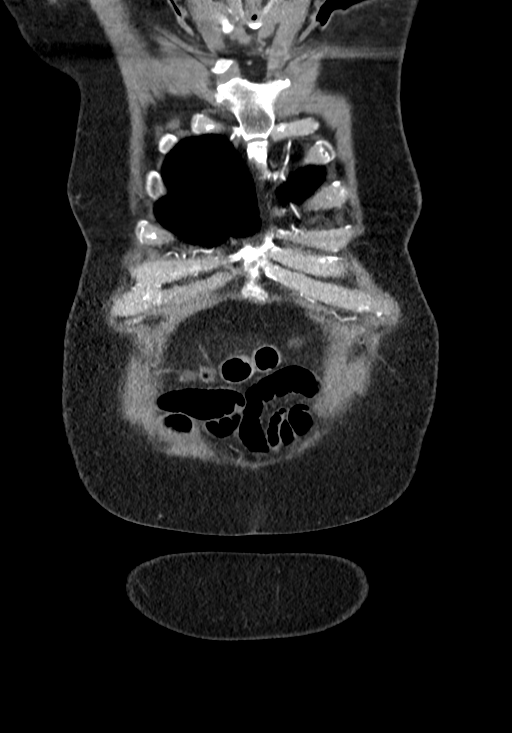
[im 106/212  soft-tissue]
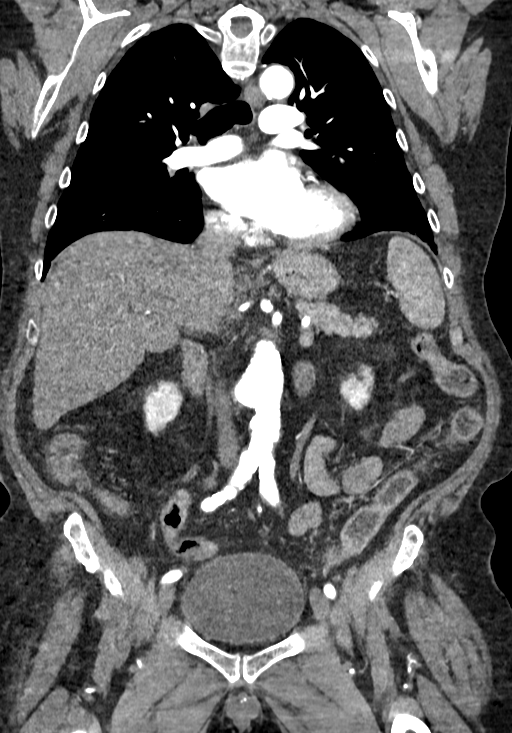
[im 159/212  soft-tissue]
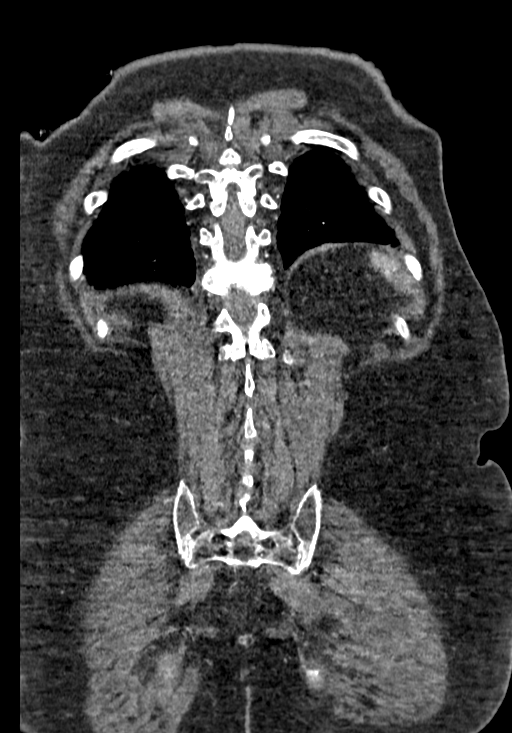

[14 of 46 positions shown; findings below may reference images not displayed]

FINDINGS: CTA CHEST FINDINGS

Cardiovascular: Conventional 3 vessel arch anatomy. The thoracic
aorta is normal in caliber. The aortic root measures 3.5 cm in
diameter at the sinuses of Valsalva. Scattered atherosclerotic
plaque is present. No significant penetrating ulceration. No
evidence of dissection. Calcifications are present along the course
of the coronary arteries. The heart is within normal limits for
size. Normal caliber main pulmonary artery. No central pulmonary
embolus. No evidence of pericardial effusion.

Mediastinum/Nodes: Unremarkable CT appearance of the thyroid gland.
No suspicious mediastinal or hilar adenopathy. No soft tissue
mediastinal mass. Small hiatal hernia.

Lungs/Pleura: 4 mm subpleural pulmonary nodule along the right minor
fissure (image 67 series 7). Minimal dependent atelectasis. Trace
subpleural reticulation in the periphery of the lower lungs
consistent with very mild fibrotic changes. The lungs are otherwise
clear. No suspicious pulmonary nodule, pleural effusion or
pneumothorax.

Musculoskeletal: No acute fracture or aggressive appearing lytic or
blastic osseous lesion.

Review of the MIP images confirms the above findings.

CTA ABDOMEN AND PELVIS FINDINGS

VASCULAR

Aorta: Mild heterogeneous atherosclerotic plaque. Lobulated fusiform
dilatation of the infrarenal abdominal aorta with a maximal diameter
of 5.7 x 3.9 cm. There is mild wall adherent thrombus. No evidence
of dissection.

Celiac: Heterogeneous atherosclerotic plaque at the origin without
significant stenosis. No evidence of dissection or aneurysm.

SMA: Predominantly calcified atherosclerotic plaque at the origin
results in perhaps mild narrowing. No dissection or aneurysm.

Renals: There are 2 renal arteries bilaterally. Both of the left
renal arteries are lower than the 2 right-sided renal arteries.
Heterogeneous atherosclerotic plaque is present at the origins with
perhaps mild narrowing. No significant renal artery stenosis. No
changes of fibromuscular dysplasia.

IMA: Patent.

Inflow: Heterogeneous atherosclerotic plaque without focal stenosis,
aneurysm or dissection involving either internal or external iliac
artery. There is mild aneurysmal dilatation of the left internal
iliac artery with a maximal diameter of 1.3 cm. No significant
stenosis or dissection.

Veins: No focal venous abnormality.

Review of the MIP images confirms the above findings.

NON-VASCULAR

Hepatobiliary: Diffusely nodular hepatic contour with blunting of
the free edge of the left hepatic lobe. Imaging findings are most
consistent with hepatic cirrhosis. No discrete arterially enhancing
lesion. There is a small 0.9 cm low-attenuation lesion in the
superior aspect of hepatic segment [DATE] likely representing a benign
cyst. No intra or extrahepatic biliary ductal dilatation. The
gallbladder is unremarkable. Small recanalized paraumbilical vein.

Pancreas: Unremarkable. No pancreatic ductal dilatation or
surrounding inflammatory changes.

Spleen: Borderline splenomegaly.

Adrenals/Urinary Tract: Normal appearance of the adrenal glands.
Relatively symmetric renal parenchymal enhancement bilaterally. No
enhancing renal mass, hydronephrosis or nephrolithiasis. The ureters
and bladder are unremarkable.

Stomach/Bowel: Relatively high-riding cecum. No focal bowel wall
thickening or evidence of obstruction.

Lymphatic: No suspicious lymphadenopathy.

Reproductive: Prostate is unremarkable.

Other: No abdominal wall hernia or abnormality. No abdominopelvic
ascites.

Musculoskeletal: No acute fracture or aggressive appearing lytic or
blastic osseous lesion. Evidence of prior T12 compression fracture
with vertebra plana appearance. There is associated exaggerated
thoracolumbar kyphosis. Enhanced trabeculation throughout the L1
vertebral body consistent with the presence of a large hemangioma.

Review of the MIP images confirms the above findings.
IMPRESSION: CTA CHEST

1. No evidence of thoracic aortic aneurysm, dissection or
penetrating ulcer.
2. Coronary artery calcifications.
3.  Aortic Atherosclerosis (OTUIZ-170.0)
4. Small hiatal hernia.
5. Mild peripheral basilar fibrotic changes in the lower lungs.
6. Small 4 mm subpleural right lower lobe pulmonary nodule. No
follow-up needed if patient is low-risk. Non-contrast chest CT can
be considered in 12 months if patient is high-risk. This
recommendation follows the consensus statement: Guidelines for
Management of Incidental Pulmonary Nodules Detected on CT Images:

CTA ABD/PELVIS

1. Somewhat irregular/lobular aneurysmal dilatation of the
infrarenal abdominal aorta with a maximal diameter of 5.7 cm.
2. Two renal arteries are present bilaterally, the 2 left renal
arteries are lower than the 2 right renal arteries.
3. Small left internal iliac artery aneurysm with a maximal diameter
of 1.3 cm.
4. Hepatic cirrhosis with evidence of portal hypertension (mild
splenomegaly and recanalized paraumbilical vein).
5. Prior T12 compression fracture with vertebra plana appearance and
associated exaggerated thoracolumbar kyphosis.
6. Additional ancillary findings as above.

## 2018-08-16 ENCOUNTER — Telehealth: Payer: Self-pay | Admitting: Hematology

## 2018-08-16 NOTE — Telephone Encounter (Signed)
Cancelled 4/9 appt per 4/6 sch message - unable to reach patient . Left message for patient to call back to r/s

## 2018-08-19 ENCOUNTER — Inpatient Hospital Stay: Payer: Medicare Other | Admitting: Hematology

## 2018-08-19 ENCOUNTER — Inpatient Hospital Stay: Payer: Medicare Other

## 2018-09-07 IMAGING — DX DG CHEST 1V PORT
1 series · 1 of 1 positions shown · non-contrast
Comparison: CTA chest dated 11/02/2017

CLINICAL DATA: AAA

EXAM:
PORTABLE CHEST 1 VIEW

[chest]
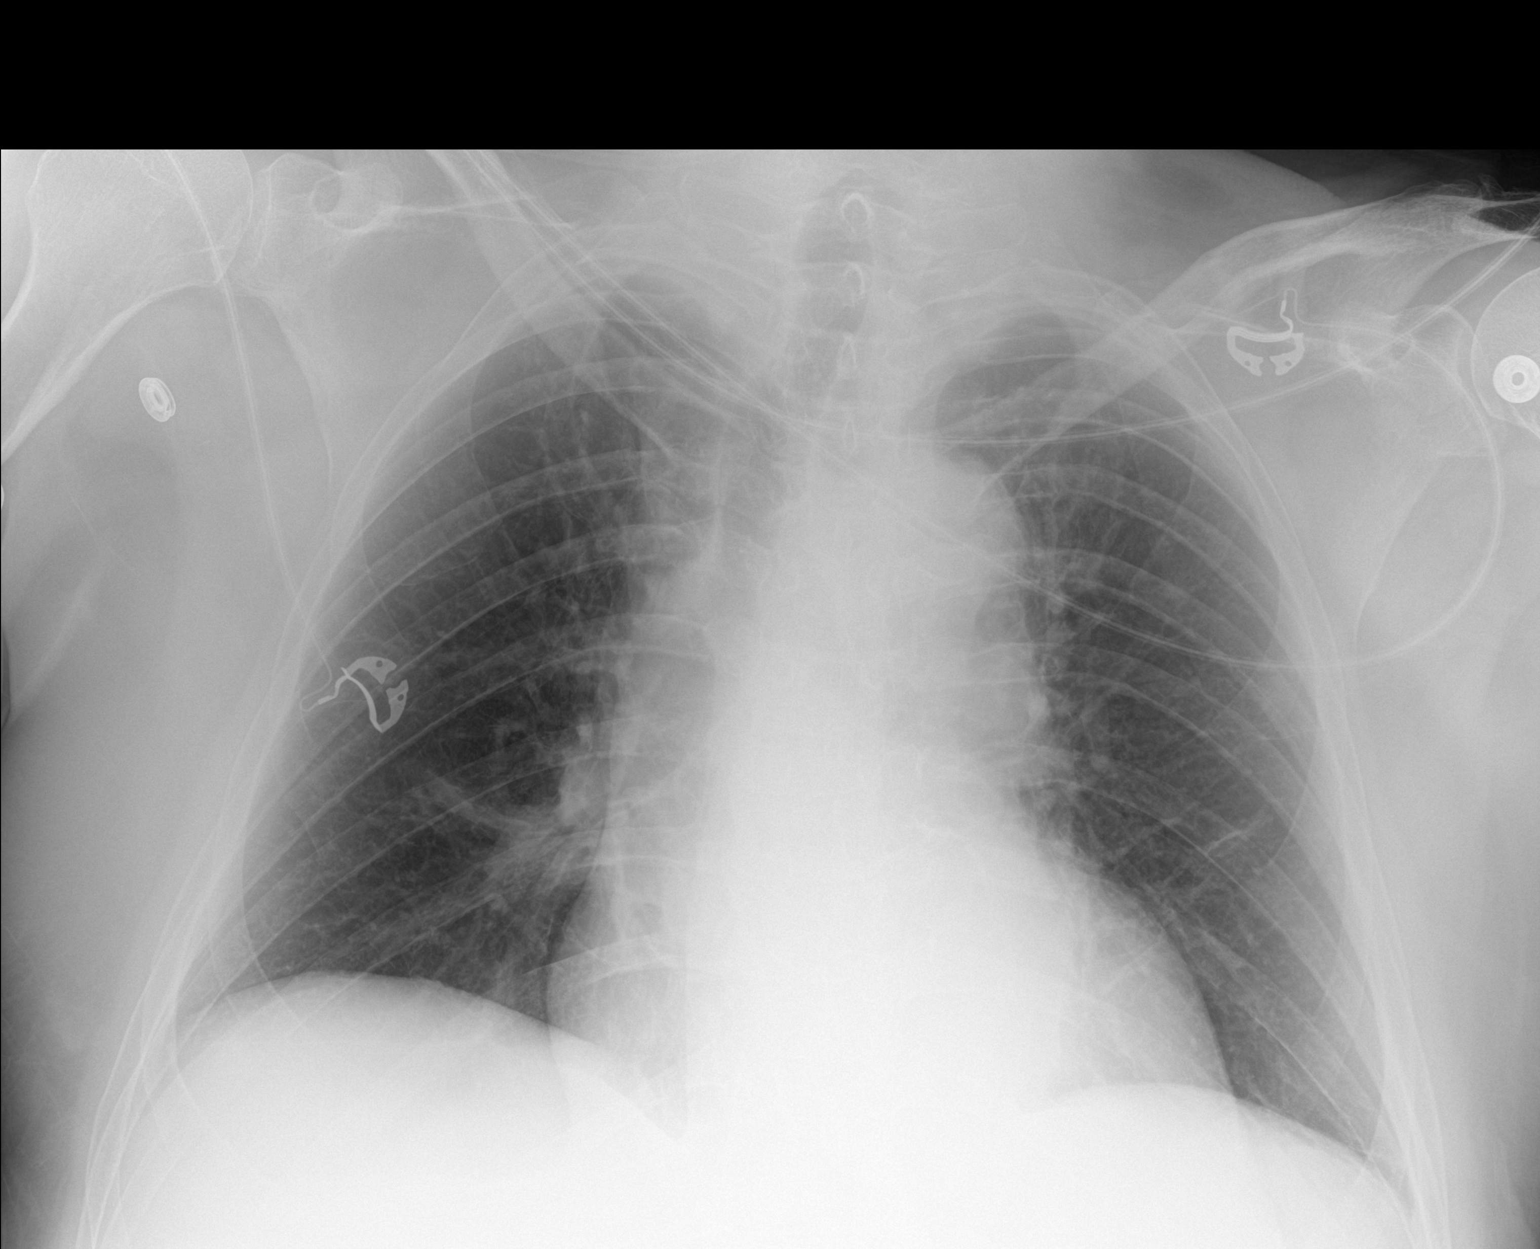

[1 of 1 positions shown; findings below may reference images not displayed]

FINDINGS: Lungs are clear.  No pleural effusion or pneumothorax.

Cardiomegaly.
IMPRESSION: No evidence of acute cardiopulmonary disease.

## 2018-10-07 ENCOUNTER — Telehealth: Payer: Self-pay | Admitting: Cardiology

## 2018-10-07 NOTE — Telephone Encounter (Signed)
LVM for pt to call back and let us know if he wants a phone or video visit with Dr. Martinique.

## 2018-10-14 ENCOUNTER — Telehealth: Payer: Self-pay | Admitting: Cardiology

## 2018-10-14 NOTE — Telephone Encounter (Signed)
Tele-visit/ smartphone/ consent/ my chart active/ pre reg completed

## 2018-10-18 NOTE — Progress Notes (Signed)
Virtual Visit via Telephone Note   This visit type was conducted due to national recommendations for restrictions regarding the COVID-19 Pandemic (e.g. social distancing) in an effort to limit this patient's exposure and mitigate transmission in our community.  Due to his co-morbid illnesses, this patient is at least at moderate risk for complications without adequate follow up.  This format is felt to be most appropriate for this patient at this time.  The patient did not have access to video technology/had technical difficulties with video requiring transitioning to audio format only (telephone).  All issues noted in this document were discussed and addressed.  No physical exam could be performed with this format.  Please refer to the patient's chart for his  consent to telehealth for [facility].   Date:  10/20/2018   ID:  Jose Beck, DOB 24-Nov-1945, MRN [id number]  Patient Location: Home Provider Location: Home  PCP:  [name], MD  Cardiologist:  Peter Martinique, MD  Electrophysiologist:  None   Evaluation Performed:  Follow-Up Visit  Chief Complaint:  Follow up CAD  History of Present Illness:    Jose Beck is a 73 y.o. male with past medical history of CAD (s/p known occlusion of LAD and RCA by cath in 1992 with cath in 2017 showing similar findings with collateral flow noted), chronic combined systolic and diastolic CHF, HTN, HLD, Type 2 DM, GERD, and prior CVA.  He had a fall with vertebral fracture in December 2018 managed conservatively. He had some increased garbled speech in March that resolved. MRI showed multiple chronic strokes but no acute changes. Prior  MRI demonstrated a 4.6 cm AAA. Seen by Dr. Trula Slade and US showed 3.6 cm AAA. CTA done this June showed increase size to 5.7 cm. He subsequently underwent endovascular repair on 11/25/17. Follow up CT in August showed good repair.   He is doing very well from a cardiac standpoint. He denies any  SOB, chest pain, edema, palpitations, dizziness. His activity is still limited by chronic back pain. He has lost some weight. Risks factors are well controlled and he is tolerating his medication well.   The patient does not have symptoms concerning for COVID-19 infection (fever, chills, cough, or new shortness of breath).    Past Medical History:  Diagnosis Date  . Abdominal aortic aneurysm (AAA) ([place])   . Aortic stenosis    a. 11/2015 Echo: EF 55-60%, Gr1 DD, mild AS.  Marland Kitchen Chronic combined systolic and diastolic CHF (congestive heart failure) ([place])    a. 07/2015: TEE showing EF of 40% b. 11/2015: Echo w/ EF 55-60%, Grade 1 DD, mild AS.  Marland Kitchen Coronary artery disease    a. patient reports 100% stenosis of LAD and RCA by cath in 1999. b. 11/2015 Cath: LM nl, LAD 100 - fills by L->L collats from D1, LCX 60d, OM1 lalrge/nl, OM2 small/nl, RCA 166m, L->R collats.EF 35-45% (55-60% by echo).  . Diabetes mellitus without complication (HCC)    Type II  . GERD (gastroesophageal reflux disease)   . Hyperlipemia   . Hypertension   . Hypertensive heart disease   . Stroke [facility])    a. 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 Thrombocytopenia ([place]) 11/2015   Past Surgical History:  Procedure Laterality Date  . ABDOMINAL AORTIC ENDOVASCULAR STENT GRAFT N/A 11/25/2017   Procedure: ABDOMINAL AORTIC ENDOVASCULAR STENT GRAFT;  Surgeon: Serafina Mitchell, MD;  Location: [place];  Service: Vascular;  Laterality: N/A;  . CARDIAC  CATHETERIZATION    . CARDIAC CATHETERIZATION N/A 11/16/2015   Procedure: Left Heart Cath and Coronary Angiography;  Surgeon: [name], MD;  Location: [place] CV LAB;  Service: Cardiovascular;  Laterality: N/A;  . MOLE REMOVAL    . TONSILLECTOMY       Current Meds  Medication Sig  . acetaminophen (TYLENOL 8 HOUR) 650 MG CR tablet Take 650 mg by mouth every 8 (eight) hours as needed for pain.  Marland Kitchen atorvastatin (LIPITOR) 80 MG tablet Take 1 tablet (80 mg total) by mouth daily at 6  PM.  . carvedilol (COREG) 25 MG tablet Take 1 tablet (25 mg total) by mouth 2 (two) times daily.  . clopidogrel (PLAVIX) 75 MG tablet Take 1 tablet (75 mg total) by mouth daily.  . insulin detemir (LEVEMIR) 100 UNIT/ML injection Inject 20 Units into the skin every morning.   Marland Kitchen losartan (COZAAR) 50 MG tablet Take 1 tablet (50 mg total) by mouth daily.  . nitroGLYCERIN (NITROSTAT) 0.4 MG SL tablet Place 0.4 mg under the tongue every 5 (five) minutes as needed for chest pain (x 3 doses).  . tacrolimus (PROTOPIC) 0.1 % ointment Apply 1 application topically daily as needed (facial scaling).     Allergies:   Cheese and Pravastatin   Social History   Tobacco Use  . Smoking status: Never Smoker  . Smokeless tobacco: Never Used  Substance Use Topics  . Alcohol use: Yes    Alcohol/week: 1.0 standard drinks    Types: 1 Shots of liquor per week    Comment: cocktails every day  . Drug use: No     Family Hx: The patient's family history includes Heart disease in his mother; Hypertension in his mother.  ROS:   Please see the history of present illness.    All other systems reviewed and are negative.   Prior CV studies:   The following studies were reviewed today:  none  Labs/Other Tests and Data Reviewed:    EKG:  No ECG reviewed.  Recent Labs: 11/18/2017: ALT 19 11/25/2017: Magnesium 1.9 11/26/2017: BUN 11; Creatinine, Ser 0.91; Hemoglobin 13.4; Platelets 94; Potassium 4.6; Sodium 130   Recent Lipid Panel Lab Results  Component Value Date/Time   CHOL 151 11/16/2015 07:02 AM   TRIG 57 11/16/2015 07:02 AM   HDL 46 11/16/2015 07:02 AM   CHOLHDL 3.3 11/16/2015 07:02 AM   LDLCALC 94 11/16/2015 07:02 AM   Dated 06/07/18: cholesterol 130, triglycerides 49, HDL 61, LDL 59. A1c 5.5%. CMET and TSH normal. WBC 2.3, Hgb 13.1, plts 94K.   Wt Readings from Last 3 Encounters:  10/20/18 232 lb 8 oz (105.5 kg)  06/28/18 240 lb 11.9 oz (109.2 kg)  04/22/18 240 lb (108.9 kg)     Objective:     Vital Signs:  BP 123/83   Pulse 65   Ht 5\' 8"  (1.727 m)   Wt 232 lb 8 oz (105.5 kg)   BMI 35.35 kg/m    VITAL SIGNS:  reviewed  ASSESSMENT & PLAN:    1. CAD - the patient has known occlusion of the LAD and RCA by cath in 1992 with repeat cath in 2017 showing similar findings with good collateral flow noted.  - he is asymptomatic. - continue Plavix, BB, and statin therapy.   2. Chronic Combined Systolic and Diastolic CHF - cath in 95/2841 showed a reduced EF of 35-40% but EF was preserved at 55-60% by echo at that time.  - no evidence of  volume overload.  - continue Coreg 25mg  BID and Losartan 50mg  daily.   3. HTN - BP is well controlled.  4. HLD -  Last LDL 59.  -  Remains on high-dose Atorvastatin 80mg  daily.  5. Type 2 DM -  Followed by PCP. Hgb A1c was well-controlled at 5.5%  6. AAA -  S/p endovascular repair in July 2019. Follow up US in February 2020 showed no endoleak and smaller AAA  COVID-19 Education: The signs and symptoms of COVID-19 were discussed with the patient and how to seek care for testing (follow up with PCP or arrange E-visit).  The importance of social distancing was discussed today.  Time:   Today, I have spent 15 minutes with the patient with telehealth technology discussing the above problems.     Medication Adjustments/Labs and Tests Ordered: Current medicines are reviewed at length with the patient today.  Concerns regarding medicines are outlined above.   Tests Ordered: No orders of the defined types were placed in this encounter.   Medication Changes: No orders of the defined types were placed in this encounter.   Disposition:  Follow up in 6 month(s)  Signed, Peter Martinique, MD  10/20/2018 8:57 AM     [facility]

## 2018-10-20 ENCOUNTER — Telehealth (INDEPENDENT_AMBULATORY_CARE_PROVIDER_SITE_OTHER): Payer: Medicare Other | Admitting: Cardiology

## 2018-10-20 ENCOUNTER — Encounter: Payer: Self-pay | Admitting: Cardiology

## 2018-10-20 VITALS — BP 123/83 | HR 65 | Ht 68.0 in | Wt 232.5 lb

## 2018-10-20 DIAGNOSIS — M1990 Unspecified osteoarthritis, unspecified site: Secondary | ICD-10-CM | POA: Diagnosis not present

## 2018-10-20 DIAGNOSIS — I5042 Chronic combined systolic (congestive) and diastolic (congestive) heart failure: Secondary | ICD-10-CM

## 2018-10-20 DIAGNOSIS — I10 Essential (primary) hypertension: Secondary | ICD-10-CM

## 2018-10-20 DIAGNOSIS — I714 Abdominal aortic aneurysm, without rupture, unspecified: Secondary | ICD-10-CM

## 2018-10-20 DIAGNOSIS — E1151 Type 2 diabetes mellitus with diabetic peripheral angiopathy without gangrene: Secondary | ICD-10-CM | POA: Diagnosis not present

## 2018-10-20 DIAGNOSIS — D696 Thrombocytopenia, unspecified: Secondary | ICD-10-CM | POA: Diagnosis not present

## 2018-10-20 DIAGNOSIS — N401 Enlarged prostate with lower urinary tract symptoms: Secondary | ICD-10-CM | POA: Diagnosis not present

## 2018-10-20 DIAGNOSIS — I2510 Atherosclerotic heart disease of native coronary artery without angina pectoris: Secondary | ICD-10-CM

## 2018-10-20 DIAGNOSIS — E785 Hyperlipidemia, unspecified: Secondary | ICD-10-CM | POA: Diagnosis not present

## 2018-10-20 DIAGNOSIS — I69320 Aphasia following cerebral infarction: Secondary | ICD-10-CM | POA: Diagnosis not present

## 2018-10-20 DIAGNOSIS — R404 Transient alteration of awareness: Secondary | ICD-10-CM | POA: Diagnosis not present

## 2018-10-20 DIAGNOSIS — Z8673 Personal history of transient ischemic attack (TIA), and cerebral infarction without residual deficits: Secondary | ICD-10-CM

## 2018-10-20 DIAGNOSIS — K760 Fatty (change of) liver, not elsewhere classified: Secondary | ICD-10-CM | POA: Diagnosis not present

## 2018-10-20 DIAGNOSIS — I69959 Hemiplegia and hemiparesis following unspecified cerebrovascular disease affecting unspecified side: Secondary | ICD-10-CM | POA: Diagnosis not present

## 2018-10-20 DIAGNOSIS — E7800 Pure hypercholesterolemia, unspecified: Secondary | ICD-10-CM

## 2018-10-20 NOTE — Patient Instructions (Signed)

## 2018-12-27 DIAGNOSIS — Z85828 Personal history of other malignant neoplasm of skin: Secondary | ICD-10-CM | POA: Diagnosis not present

## 2018-12-27 DIAGNOSIS — L821 Other seborrheic keratosis: Secondary | ICD-10-CM | POA: Diagnosis not present

## 2018-12-27 DIAGNOSIS — L820 Inflamed seborrheic keratosis: Secondary | ICD-10-CM | POA: Diagnosis not present

## 2018-12-27 DIAGNOSIS — L814 Other melanin hyperpigmentation: Secondary | ICD-10-CM | POA: Diagnosis not present

## 2018-12-27 DIAGNOSIS — Z86018 Personal history of other benign neoplasm: Secondary | ICD-10-CM | POA: Diagnosis not present

## 2018-12-27 DIAGNOSIS — D225 Melanocytic nevi of trunk: Secondary | ICD-10-CM | POA: Diagnosis not present

## 2018-12-27 DIAGNOSIS — D2222 Melanocytic nevi of left ear and external auricular canal: Secondary | ICD-10-CM | POA: Diagnosis not present

## 2019-03-07 DIAGNOSIS — I714 Abdominal aortic aneurysm, without rupture: Secondary | ICD-10-CM | POA: Diagnosis not present

## 2019-03-07 DIAGNOSIS — I69320 Aphasia following cerebral infarction: Secondary | ICD-10-CM | POA: Diagnosis not present

## 2019-03-07 DIAGNOSIS — M1990 Unspecified osteoarthritis, unspecified site: Secondary | ICD-10-CM | POA: Diagnosis not present

## 2019-03-07 DIAGNOSIS — I10 Essential (primary) hypertension: Secondary | ICD-10-CM | POA: Diagnosis not present

## 2019-03-07 DIAGNOSIS — E1151 Type 2 diabetes mellitus with diabetic peripheral angiopathy without gangrene: Secondary | ICD-10-CM | POA: Diagnosis not present

## 2019-03-07 DIAGNOSIS — N401 Enlarged prostate with lower urinary tract symptoms: Secondary | ICD-10-CM | POA: Diagnosis not present

## 2019-03-07 DIAGNOSIS — I69959 Hemiplegia and hemiparesis following unspecified cerebrovascular disease affecting unspecified side: Secondary | ICD-10-CM | POA: Diagnosis not present

## 2019-03-07 DIAGNOSIS — R404 Transient alteration of awareness: Secondary | ICD-10-CM | POA: Diagnosis not present

## 2019-03-07 DIAGNOSIS — E785 Hyperlipidemia, unspecified: Secondary | ICD-10-CM | POA: Diagnosis not present

## 2019-03-07 DIAGNOSIS — I2510 Atherosclerotic heart disease of native coronary artery without angina pectoris: Secondary | ICD-10-CM | POA: Diagnosis not present

## 2019-03-07 DIAGNOSIS — K7460 Unspecified cirrhosis of liver: Secondary | ICD-10-CM | POA: Diagnosis not present

## 2019-03-07 DIAGNOSIS — D696 Thrombocytopenia, unspecified: Secondary | ICD-10-CM | POA: Diagnosis not present

## 2019-04-11 DIAGNOSIS — E119 Type 2 diabetes mellitus without complications: Secondary | ICD-10-CM | POA: Diagnosis not present

## 2019-04-18 ENCOUNTER — Ambulatory Visit: Payer: Medicare Other | Admitting: Cardiology

## 2019-04-18 NOTE — Progress Notes (Signed)
Virtual Visit via Telephone Note   This visit type was conducted due to national recommendations for restrictions regarding the COVID-19 Pandemic (e.g. social distancing) in an effort to limit this patient's exposure and mitigate transmission in our community.  Due to his co-morbid illnesses, this patient is at least at moderate risk for complications without adequate follow up.  This format is felt to be most appropriate for this patient at this time.  The patient did not have access to video technology/had technical difficulties with video requiring transitioning to audio format only (telephone).  All issues noted in this document were discussed and addressed.  No physical exam could be performed with this format.  Please refer to the patient's chart for his  consent to telehealth for [facility].   Date:  04/21/2019   ID:  Jose, Beck 07-Feb-1946, MRN [id number]  Patient Location: Home Provider Location: Home  PCP:  [name], MD  Cardiologist:  Teva Bronkema Martinique, MD  Electrophysiologist:  None   Evaluation Performed:  Follow-Up Visit  Chief Complaint:  Follow up CAD  History of Present Illness:    Jose Beck is a 73 y.o. male with past medical history of CAD (s/p known occlusion of LAD and RCA by cath in 1992 with cath in 2017 showing similar findings with collateral flow noted), chronic combined systolic and diastolic CHF, HTN, HLD, Type 2 DM, GERD, and prior CVA.  He had a fall with vertebral fracture in December 2018 managed conservatively. He had some increased garbled speech in March that resolved. MRI showed multiple chronic strokes but no acute changes. Prior  MRI demonstrated a 4.6 cm AAA. Seen by Dr. Trula Slade and US showed 3.6 cm AAA. CTA done this June showed increase size to 5.7 cm. He subsequently underwent endovascular repair on 11/25/17. Follow up CT in August showed good repair.   He is doing very well from a cardiac standpoint. He denies any  SOB, chest pain, edema, palpitations, dizziness. His activity is still limited by chronic back pain. He has lost some weight. He did have a fall in October and is now getting physical therapy for balance.  The patient does not have symptoms concerning for COVID-19 infection (fever, chills, cough, or new shortness of breath).    Past Medical History:  Diagnosis Date  . Abdominal aortic aneurysm (AAA) ([place])   . Aortic stenosis    a. 11/2015 Echo: EF 55-60%, Gr1 DD, mild AS.  Marland Kitchen Chronic combined systolic and diastolic CHF (congestive heart failure) ([place])    a. 07/2015: TEE showing EF of 40% b. 11/2015: Echo w/ EF 55-60%, Grade 1 DD, mild AS.  Marland Kitchen Coronary artery disease    a. patient reports 100% stenosis of LAD and RCA by cath in 1999. b. 11/2015 Cath: LM nl, LAD 100 - fills by L->L collats from D1, LCX 60d, OM1 lalrge/nl, OM2 small/nl, RCA 112m, L->R collats.EF 35-45% (55-60% by echo).  . Diabetes mellitus without complication (HCC)    Type II  . GERD (gastroesophageal reflux disease)   . Hyperlipemia   . Hypertension   . Hypertensive heart disease   . Stroke [facility])    a. 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 Thrombocytopenia ([place]) 11/2015   Past Surgical History:  Procedure Laterality Date  . ABDOMINAL AORTIC ENDOVASCULAR STENT GRAFT N/A 11/25/2017   Procedure: ABDOMINAL AORTIC ENDOVASCULAR STENT GRAFT;  Surgeon: Serafina Mitchell, MD;  Location: [place];  Service: Vascular;  Laterality: N/A;  .  CARDIAC CATHETERIZATION    . CARDIAC CATHETERIZATION N/A 11/16/2015   Procedure: Left Heart Cath and Coronary Angiography;  Surgeon: [name], MD;  Location: [place] CV LAB;  Service: Cardiovascular;  Laterality: N/A;  . MOLE REMOVAL    . TONSILLECTOMY       Current Meds  Medication Sig  . acetaminophen (TYLENOL 8 HOUR) 650 MG CR tablet Take 650 mg by mouth every 8 (eight) hours as needed for pain.  Marland Kitchen atorvastatin (LIPITOR) 80 MG tablet Take 1 tablet (80 mg total) by mouth daily at 6  PM.  . carvedilol (COREG) 25 MG tablet Take 25 mg by mouth 2 (two) times daily with a meal.  . clopidogrel (PLAVIX) 75 MG tablet Take 1 tablet (75 mg total) by mouth daily.  . insulin detemir (LEVEMIR) 100 UNIT/ML injection Inject 20 Units into the skin every morning.   Marland Kitchen losartan (COZAAR) 50 MG tablet Take 1 tablet (50 mg total) by mouth daily.  . nitroGLYCERIN (NITROSTAT) 0.4 MG SL tablet Place 0.4 mg under the tongue every 5 (five) minutes as needed for chest pain (x 3 doses).  . tacrolimus (PROTOPIC) 0.1 % ointment Apply 1 application topically daily as needed (facial scaling).     Allergies:   Cheese and Pravastatin   Social History   Tobacco Use  . Smoking status: Never Smoker  . Smokeless tobacco: Never Used  Substance Use Topics  . Alcohol use: Yes    Alcohol/week: 1.0 standard drinks    Types: 1 Shots of liquor per week    Comment: cocktails every day  . Drug use: No     Family Hx: The patient's family history includes Heart disease in his mother; Hypertension in his mother.  ROS:   Please see the history of present illness.    All other systems reviewed and are negative.   Prior CV studies:   The following studies were reviewed today:  none  Labs/Other Tests and Data Reviewed:    EKG:  No ECG reviewed.  Recent Labs: No results found for requested labs within last 8760 hours.   Recent Lipid Panel Lab Results  Component Value Date/Time   CHOL 151 11/16/2015 07:02 AM   TRIG 57 11/16/2015 07:02 AM   HDL 46 11/16/2015 07:02 AM   CHOLHDL 3.3 11/16/2015 07:02 AM   LDLCALC 94 11/16/2015 07:02 AM   Dated 06/07/18: cholesterol 130, triglycerides 49, HDL 61, LDL 59. A1c 5.5%. CMET and TSH normal. WBC 2.3, Hgb 13.1, plts 94K.  Dated 03/07/19: A1c 5.5%  Wt Readings from Last 3 Encounters:  04/21/19 229 lb 8 oz (104.1 kg)  10/20/18 232 lb 8 oz (105.5 kg)  06/28/18 240 lb 11.9 oz (109.2 kg)     Objective:    Vital Signs:  BP 118/75   Pulse 62   Ht 5\' 8"   (1.727 m)   Wt 229 lb 8 oz (104.1 kg)   BMI 34.90 kg/m    VITAL SIGNS:  reviewed  ASSESSMENT & PLAN:    1. CAD - the patient has known occlusion of the LAD and RCA by cath in 1992 with repeat cath in 2017 showing similar findings with good collateral flow noted.  - he is asymptomatic. - continue Plavix, BB, and statin therapy.   2. Chronic Combined Systolic and Diastolic CHF - cath in 0000000 showed a reduced EF of 35-40% but EF was preserved at 55-60% by echo at that time.  - no evidence of volume overload.  -  continue Coreg 25mg  BID and Losartan 50mg  daily.   3. HTN - BP is well controlled.  4. HLD -  Last LDL 59.  -  Remains on high-dose Atorvastatin 80mg  daily.  5. Type 2 DM -  Followed by PCP. Hgb A1c was well-controlled at 5.5%  6. AAA -  S/p endovascular repair in July 2019. Follow up US in February 2020 showed no endoleak and smaller AAA. Follow up with [facility].   COVID-19 Education: The signs and symptoms of COVID-19 were discussed with the patient and how to seek care for testing (follow up with PCP or arrange E-visit).  The importance of social distancing was discussed today.  Time:   Today, I have spent 10 minutes with the patient with telehealth technology discussing the above problems.     Medication Adjustments/Labs and Tests Ordered: Current medicines are reviewed at length with the patient today.  Concerns regarding medicines are outlined above.   Tests Ordered: No orders of the defined types were placed in this encounter.   Medication Changes: No orders of the defined types were placed in this encounter.   Disposition:  Follow up in 6 month(s)  Signed, Marycatherine Maniscalco Martinique, MD  04/21/2019 9:42 AM    [facility]

## 2019-04-21 ENCOUNTER — Encounter: Payer: Self-pay | Admitting: Cardiology

## 2019-04-21 ENCOUNTER — Telehealth (INDEPENDENT_AMBULATORY_CARE_PROVIDER_SITE_OTHER): Payer: Medicare Other | Admitting: Cardiology

## 2019-04-21 VITALS — BP 118/75 | HR 62 | Ht 68.0 in | Wt 229.5 lb

## 2019-04-21 DIAGNOSIS — I110 Hypertensive heart disease with heart failure: Secondary | ICD-10-CM

## 2019-04-21 DIAGNOSIS — Z7902 Long term (current) use of antithrombotics/antiplatelets: Secondary | ICD-10-CM

## 2019-04-21 DIAGNOSIS — Z8673 Personal history of transient ischemic attack (TIA), and cerebral infarction without residual deficits: Secondary | ICD-10-CM

## 2019-04-21 DIAGNOSIS — I5042 Chronic combined systolic (congestive) and diastolic (congestive) heart failure: Secondary | ICD-10-CM

## 2019-04-21 DIAGNOSIS — I2510 Atherosclerotic heart disease of native coronary artery without angina pectoris: Secondary | ICD-10-CM | POA: Diagnosis not present

## 2019-04-21 DIAGNOSIS — I714 Abdominal aortic aneurysm, without rupture, unspecified: Secondary | ICD-10-CM

## 2019-04-21 DIAGNOSIS — E119 Type 2 diabetes mellitus without complications: Secondary | ICD-10-CM

## 2019-04-21 DIAGNOSIS — E785 Hyperlipidemia, unspecified: Secondary | ICD-10-CM

## 2019-04-21 DIAGNOSIS — E7800 Pure hypercholesterolemia, unspecified: Secondary | ICD-10-CM

## 2019-04-21 DIAGNOSIS — I10 Essential (primary) hypertension: Secondary | ICD-10-CM

## 2019-04-21 NOTE — Patient Instructions (Signed)
Medication Instructions:  Continue same medications   Lab Work: None ordered  Testing/Procedures: None ordered  Follow-Up: At [facility], you and your health needs are our priority.  As part of our continuing mission to provide you with exceptional heart care, we have created designated Provider Care Teams.  These Care Teams include your primary Cardiologist (physician) and Advanced Practice Providers (APPs -  Physician Assistants and Nurse Practitioners) who all work together to provide you with the care you need, when you need it.  Your next appointment:  6 months    Call in March to schedule June appointment   The format for your next appointment:  Office   Provider:  Dr.Jordan

## 2019-05-16 DIAGNOSIS — M1990 Unspecified osteoarthritis, unspecified site: Secondary | ICD-10-CM | POA: Diagnosis not present

## 2019-05-16 DIAGNOSIS — M6281 Muscle weakness (generalized): Secondary | ICD-10-CM | POA: Diagnosis not present

## 2019-05-16 DIAGNOSIS — R262 Difficulty in walking, not elsewhere classified: Secondary | ICD-10-CM | POA: Diagnosis not present

## 2019-05-16 DIAGNOSIS — R260 Ataxic gait: Secondary | ICD-10-CM | POA: Diagnosis not present

## 2019-05-18 DIAGNOSIS — R260 Ataxic gait: Secondary | ICD-10-CM | POA: Diagnosis not present

## 2019-05-18 DIAGNOSIS — M1990 Unspecified osteoarthritis, unspecified site: Secondary | ICD-10-CM | POA: Diagnosis not present

## 2019-05-18 DIAGNOSIS — R262 Difficulty in walking, not elsewhere classified: Secondary | ICD-10-CM | POA: Diagnosis not present

## 2019-05-18 DIAGNOSIS — M6281 Muscle weakness (generalized): Secondary | ICD-10-CM | POA: Diagnosis not present

## 2019-05-30 DIAGNOSIS — M1990 Unspecified osteoarthritis, unspecified site: Secondary | ICD-10-CM | POA: Diagnosis not present

## 2019-05-30 DIAGNOSIS — R260 Ataxic gait: Secondary | ICD-10-CM | POA: Diagnosis not present

## 2019-05-30 DIAGNOSIS — M6281 Muscle weakness (generalized): Secondary | ICD-10-CM | POA: Diagnosis not present

## 2019-05-30 DIAGNOSIS — R262 Difficulty in walking, not elsewhere classified: Secondary | ICD-10-CM | POA: Diagnosis not present

## 2019-06-01 DIAGNOSIS — M6281 Muscle weakness (generalized): Secondary | ICD-10-CM | POA: Diagnosis not present

## 2019-06-01 DIAGNOSIS — M1990 Unspecified osteoarthritis, unspecified site: Secondary | ICD-10-CM | POA: Diagnosis not present

## 2019-06-01 DIAGNOSIS — R260 Ataxic gait: Secondary | ICD-10-CM | POA: Diagnosis not present

## 2019-06-01 DIAGNOSIS — R262 Difficulty in walking, not elsewhere classified: Secondary | ICD-10-CM | POA: Diagnosis not present

## 2019-06-09 DIAGNOSIS — M1990 Unspecified osteoarthritis, unspecified site: Secondary | ICD-10-CM | POA: Diagnosis not present

## 2019-06-09 DIAGNOSIS — R260 Ataxic gait: Secondary | ICD-10-CM | POA: Diagnosis not present

## 2019-06-09 DIAGNOSIS — M6281 Muscle weakness (generalized): Secondary | ICD-10-CM | POA: Diagnosis not present

## 2019-06-09 DIAGNOSIS — R262 Difficulty in walking, not elsewhere classified: Secondary | ICD-10-CM | POA: Diagnosis not present

## 2019-06-14 DIAGNOSIS — M6281 Muscle weakness (generalized): Secondary | ICD-10-CM | POA: Diagnosis not present

## 2019-06-14 DIAGNOSIS — R260 Ataxic gait: Secondary | ICD-10-CM | POA: Diagnosis not present

## 2019-06-14 DIAGNOSIS — M1990 Unspecified osteoarthritis, unspecified site: Secondary | ICD-10-CM | POA: Diagnosis not present

## 2019-06-14 DIAGNOSIS — R262 Difficulty in walking, not elsewhere classified: Secondary | ICD-10-CM | POA: Diagnosis not present

## 2019-06-14 NOTE — Progress Notes (Signed)
POST OPERATIVE OFFICE NOTE    CC:  F/u for EVAR 18 months post-procedure  HPI:  This is a 74 y.o. male who is s/p EVAR on 11/25/2017 for 5.7cm aneurysm.  He presents for annual follow-up.  His wife is present during interview and examination via telephone.  No reports of temporary blindness, worsening extremity weakness, rest pain or lower extremity cramping with walking.  He reports having some chronic back pain over the past 2 years and frequently sleeps in a reclined position.  He is unable to tolerate statin medication due to side effects.  His wife also reports aspirin was discontinued due to easy bleeding and bruisability.  Medical hx includes DM2, hypertension, CAD on medical therapy, CVA approximately 2 years ago. At the time of his stroke, his carotids were widely patent.  He continues his Plavix. On beta blocker and ARB for his htn/CAD. He continues to refrain from tobacco use.  Allergies  Allergen Reactions  . Cheese Nausea And Vomiting  . Pravastatin Anxiety    Anxiety attack    Current Outpatient Medications  Medication Sig Dispense Refill  . acetaminophen (TYLENOL 8 HOUR) 650 MG CR tablet Take 650 mg by mouth every 8 (eight) hours as needed for pain.    Marland Kitchen atorvastatin (LIPITOR) 80 MG tablet Take 1 tablet (80 mg total) by mouth daily at 6 PM. 30 tablet 6  . carvedilol (COREG) 25 MG tablet Take 1 tablet (25 mg total) by mouth 2 (two) times daily. 60 tablet 1  . carvedilol (COREG) 25 MG tablet Take 25 mg by mouth 2 (two) times daily with a meal.    . clopidogrel (PLAVIX) 75 MG tablet Take 1 tablet (75 mg total) by mouth daily. 30 tablet 1  . insulin detemir (LEVEMIR) 100 UNIT/ML injection Inject 20 Units into the skin every morning.     Marland Kitchen losartan (COZAAR) 50 MG tablet Take 1 tablet (50 mg total) by mouth daily. 90 tablet 2  . nitroGLYCERIN (NITROSTAT) 0.4 MG SL tablet Place 0.4 mg under the tongue every 5 (five) minutes as needed for chest pain (x 3 doses).    . tacrolimus  (PROTOPIC) 0.1 % ointment Apply 1 application topically daily as needed (facial scaling).     No current facility-administered medications for this visit.     ROS:  See HPI.  The patient denies chest pain, back pain, shortness of breath.  He has residual right upper extremity ataxia  Physical Exam:   Vitals:   06/17/19 0831  Weight: 232 lb (105.2 kg)  Height: 5\' 9"  (1.753 m)   General: Well-developed, well-nourished obese male in no apparent distress Neurologic: Patient is alert and oriented x4.  Mild dysarthria. Cardiovascular: The heart rate and rhythm are regular.  No murmur.  No carotid bruits. Extremities: Bilateral upper extremity grip strength 5/5.  Gait not assessed.  Both feet are warm with active range of motion and intact sensation Lungs: Clear to auscultation bilaterally Abdomen: Soft without palpable pulsatile mass Pulse exam: The patient has 2+ bilateral radial, popliteal and femoral pulses.  Dampened Doppler signals present DP, PT and peroneal bilaterally  Endovascular Aortic Repair (EVAR):  +----------+----------------+-------------------+-------------------+       Diameter AP (cm)Diameter Trans (cm)Velocities (cm/sec)  +----------+----------------+-------------------+-------------------+  Aorta   4.44      4.52        94           +----------+----------------+-------------------+-------------------+  Right Limb1.31      1.40  71           +----------+----------------+-------------------+-------------------+  Left Limb 1.35      1.35        83           +----------+----------------+-------------------+-------------------+   Summary:  Abdominal Aorta: Patent endovascular aneurysm repair with no evidence of  endoleak. The largest aortic diameter has decreased compared to prior  exam. Previous diameter measurement was 4.6 cm obtained on 06/28/18.   Assessment/Plan:  This  is a 74 y.o. male who is s/p: EVAR July 2019.  No evidence of endoleak.  Greatest AP diameter of abdominal aorta measures 4.44 cm  -Plan follow-up surveillance evaluation in 1 year   Barbie Banner, PA-C [facility] [phone]  Clinic MD: Donzetta Matters

## 2019-06-16 ENCOUNTER — Other Ambulatory Visit: Payer: Self-pay

## 2019-06-16 DIAGNOSIS — I714 Abdominal aortic aneurysm, without rupture, unspecified: Secondary | ICD-10-CM

## 2019-06-16 DIAGNOSIS — R262 Difficulty in walking, not elsewhere classified: Secondary | ICD-10-CM | POA: Diagnosis not present

## 2019-06-16 DIAGNOSIS — R260 Ataxic gait: Secondary | ICD-10-CM | POA: Diagnosis not present

## 2019-06-16 DIAGNOSIS — M1990 Unspecified osteoarthritis, unspecified site: Secondary | ICD-10-CM | POA: Diagnosis not present

## 2019-06-16 DIAGNOSIS — M6281 Muscle weakness (generalized): Secondary | ICD-10-CM | POA: Diagnosis not present

## 2019-06-17 ENCOUNTER — Ambulatory Visit (INDEPENDENT_AMBULATORY_CARE_PROVIDER_SITE_OTHER): Payer: Medicare Other | Admitting: Physician Assistant

## 2019-06-17 ENCOUNTER — Other Ambulatory Visit: Payer: Self-pay

## 2019-06-17 ENCOUNTER — Ambulatory Visit (HOSPITAL_COMMUNITY)
Admission: RE | Admit: 2019-06-17 | Discharge: 2019-06-17 | Disposition: A | Discharge status: Home / Self Care | Payer: Medicare Other | Source: Ambulatory Visit | Attending: Vascular Surgery | Admitting: Vascular Surgery

## 2019-06-17 VITALS — BP 161/84 | HR 65 | Temp 97.2°F | Resp 18 | Ht 69.0 in | Wt 232.0 lb

## 2019-06-17 DIAGNOSIS — I714 Abdominal aortic aneurysm, without rupture, unspecified: Secondary | ICD-10-CM

## 2019-06-20 ENCOUNTER — Other Ambulatory Visit: Payer: Self-pay | Admitting: *Deleted

## 2019-06-20 DIAGNOSIS — I713 Abdominal aortic aneurysm, ruptured, unspecified: Secondary | ICD-10-CM

## 2019-06-21 DIAGNOSIS — M1990 Unspecified osteoarthritis, unspecified site: Secondary | ICD-10-CM | POA: Diagnosis not present

## 2019-06-21 DIAGNOSIS — R262 Difficulty in walking, not elsewhere classified: Secondary | ICD-10-CM | POA: Diagnosis not present

## 2019-06-21 DIAGNOSIS — M6281 Muscle weakness (generalized): Secondary | ICD-10-CM | POA: Diagnosis not present

## 2019-06-21 DIAGNOSIS — R260 Ataxic gait: Secondary | ICD-10-CM | POA: Diagnosis not present

## 2019-06-28 DIAGNOSIS — R262 Difficulty in walking, not elsewhere classified: Secondary | ICD-10-CM | POA: Diagnosis not present

## 2019-06-28 DIAGNOSIS — M1990 Unspecified osteoarthritis, unspecified site: Secondary | ICD-10-CM | POA: Diagnosis not present

## 2019-06-28 DIAGNOSIS — M6281 Muscle weakness (generalized): Secondary | ICD-10-CM | POA: Diagnosis not present

## 2019-06-28 DIAGNOSIS — R260 Ataxic gait: Secondary | ICD-10-CM | POA: Diagnosis not present

## 2019-06-29 DIAGNOSIS — Z125 Encounter for screening for malignant neoplasm of prostate: Secondary | ICD-10-CM | POA: Diagnosis not present

## 2019-06-29 DIAGNOSIS — E1151 Type 2 diabetes mellitus with diabetic peripheral angiopathy without gangrene: Secondary | ICD-10-CM | POA: Diagnosis not present

## 2019-06-29 DIAGNOSIS — I10 Essential (primary) hypertension: Secondary | ICD-10-CM | POA: Diagnosis not present

## 2019-06-29 DIAGNOSIS — E7849 Other hyperlipidemia: Secondary | ICD-10-CM | POA: Diagnosis not present

## 2019-06-29 DIAGNOSIS — R82998 Other abnormal findings in urine: Secondary | ICD-10-CM | POA: Diagnosis not present

## 2019-07-05 DIAGNOSIS — M6281 Muscle weakness (generalized): Secondary | ICD-10-CM | POA: Diagnosis not present

## 2019-07-05 DIAGNOSIS — R262 Difficulty in walking, not elsewhere classified: Secondary | ICD-10-CM | POA: Diagnosis not present

## 2019-07-05 DIAGNOSIS — R260 Ataxic gait: Secondary | ICD-10-CM | POA: Diagnosis not present

## 2019-07-05 DIAGNOSIS — M1990 Unspecified osteoarthritis, unspecified site: Secondary | ICD-10-CM | POA: Diagnosis not present

## 2019-07-06 DIAGNOSIS — D693 Immune thrombocytopenic purpura: Secondary | ICD-10-CM | POA: Diagnosis not present

## 2019-07-06 DIAGNOSIS — I2510 Atherosclerotic heart disease of native coronary artery without angina pectoris: Secondary | ICD-10-CM | POA: Diagnosis not present

## 2019-07-06 DIAGNOSIS — M1990 Unspecified osteoarthritis, unspecified site: Secondary | ICD-10-CM | POA: Diagnosis not present

## 2019-07-06 DIAGNOSIS — E1151 Type 2 diabetes mellitus with diabetic peripheral angiopathy without gangrene: Secondary | ICD-10-CM | POA: Diagnosis not present

## 2019-07-06 DIAGNOSIS — N401 Enlarged prostate with lower urinary tract symptoms: Secondary | ICD-10-CM | POA: Diagnosis not present

## 2019-07-06 DIAGNOSIS — I10 Essential (primary) hypertension: Secondary | ICD-10-CM | POA: Diagnosis not present

## 2019-07-06 DIAGNOSIS — K921 Melena: Secondary | ICD-10-CM | POA: Diagnosis not present

## 2019-07-06 DIAGNOSIS — I714 Abdominal aortic aneurysm, without rupture: Secondary | ICD-10-CM | POA: Diagnosis not present

## 2019-07-06 DIAGNOSIS — E785 Hyperlipidemia, unspecified: Secondary | ICD-10-CM | POA: Diagnosis not present

## 2019-07-06 DIAGNOSIS — Z1331 Encounter for screening for depression: Secondary | ICD-10-CM | POA: Diagnosis not present

## 2019-07-06 DIAGNOSIS — I69959 Hemiplegia and hemiparesis following unspecified cerebrovascular disease affecting unspecified side: Secondary | ICD-10-CM | POA: Diagnosis not present

## 2019-07-07 DIAGNOSIS — M1990 Unspecified osteoarthritis, unspecified site: Secondary | ICD-10-CM | POA: Diagnosis not present

## 2019-07-07 DIAGNOSIS — R262 Difficulty in walking, not elsewhere classified: Secondary | ICD-10-CM | POA: Diagnosis not present

## 2019-07-07 DIAGNOSIS — M6281 Muscle weakness (generalized): Secondary | ICD-10-CM | POA: Diagnosis not present

## 2019-07-07 DIAGNOSIS — R260 Ataxic gait: Secondary | ICD-10-CM | POA: Diagnosis not present

## 2019-07-12 DIAGNOSIS — M6281 Muscle weakness (generalized): Secondary | ICD-10-CM | POA: Diagnosis not present

## 2019-07-12 DIAGNOSIS — R262 Difficulty in walking, not elsewhere classified: Secondary | ICD-10-CM | POA: Diagnosis not present

## 2019-07-12 DIAGNOSIS — R260 Ataxic gait: Secondary | ICD-10-CM | POA: Diagnosis not present

## 2019-07-12 DIAGNOSIS — M1990 Unspecified osteoarthritis, unspecified site: Secondary | ICD-10-CM | POA: Diagnosis not present

## 2019-07-13 ENCOUNTER — Telehealth: Payer: Self-pay | Admitting: Cardiology

## 2019-07-13 NOTE — Telephone Encounter (Signed)
Patient's wife Jose Beck calling to request she come with the patient to his appointment 6/11, because he had a stroke and has speech issues.

## 2019-07-13 NOTE — Telephone Encounter (Signed)
Spoke to patient's wife advised ok to come back at [place] appointment.

## 2019-07-14 DIAGNOSIS — M6281 Muscle weakness (generalized): Secondary | ICD-10-CM | POA: Diagnosis not present

## 2019-07-14 DIAGNOSIS — M1990 Unspecified osteoarthritis, unspecified site: Secondary | ICD-10-CM | POA: Diagnosis not present

## 2019-07-14 DIAGNOSIS — R260 Ataxic gait: Secondary | ICD-10-CM | POA: Diagnosis not present

## 2019-07-14 DIAGNOSIS — R262 Difficulty in walking, not elsewhere classified: Secondary | ICD-10-CM | POA: Diagnosis not present

## 2019-07-18 DIAGNOSIS — R972 Elevated prostate specific antigen [PSA]: Secondary | ICD-10-CM | POA: Diagnosis not present

## 2019-07-19 DIAGNOSIS — M1990 Unspecified osteoarthritis, unspecified site: Secondary | ICD-10-CM | POA: Diagnosis not present

## 2019-07-19 DIAGNOSIS — R260 Ataxic gait: Secondary | ICD-10-CM | POA: Diagnosis not present

## 2019-07-19 DIAGNOSIS — M6281 Muscle weakness (generalized): Secondary | ICD-10-CM | POA: Diagnosis not present

## 2019-07-19 DIAGNOSIS — R262 Difficulty in walking, not elsewhere classified: Secondary | ICD-10-CM | POA: Diagnosis not present

## 2019-07-21 DIAGNOSIS — M1990 Unspecified osteoarthritis, unspecified site: Secondary | ICD-10-CM | POA: Diagnosis not present

## 2019-07-21 DIAGNOSIS — R260 Ataxic gait: Secondary | ICD-10-CM | POA: Diagnosis not present

## 2019-07-21 DIAGNOSIS — M6281 Muscle weakness (generalized): Secondary | ICD-10-CM | POA: Diagnosis not present

## 2019-07-21 DIAGNOSIS — R262 Difficulty in walking, not elsewhere classified: Secondary | ICD-10-CM | POA: Diagnosis not present

## 2019-07-26 DIAGNOSIS — R260 Ataxic gait: Secondary | ICD-10-CM | POA: Diagnosis not present

## 2019-07-26 DIAGNOSIS — M6281 Muscle weakness (generalized): Secondary | ICD-10-CM | POA: Diagnosis not present

## 2019-07-26 DIAGNOSIS — R262 Difficulty in walking, not elsewhere classified: Secondary | ICD-10-CM | POA: Diagnosis not present

## 2019-07-26 DIAGNOSIS — N1831 Chronic kidney disease, stage 3a: Secondary | ICD-10-CM | POA: Diagnosis not present

## 2019-07-26 DIAGNOSIS — I2510 Atherosclerotic heart disease of native coronary artery without angina pectoris: Secondary | ICD-10-CM | POA: Diagnosis not present

## 2019-07-26 DIAGNOSIS — M1990 Unspecified osteoarthritis, unspecified site: Secondary | ICD-10-CM | POA: Diagnosis not present

## 2019-07-26 DIAGNOSIS — I129 Hypertensive chronic kidney disease with stage 1 through stage 4 chronic kidney disease, or unspecified chronic kidney disease: Secondary | ICD-10-CM | POA: Diagnosis not present

## 2019-07-28 DIAGNOSIS — M6281 Muscle weakness (generalized): Secondary | ICD-10-CM | POA: Diagnosis not present

## 2019-07-28 DIAGNOSIS — R260 Ataxic gait: Secondary | ICD-10-CM | POA: Diagnosis not present

## 2019-07-28 DIAGNOSIS — R262 Difficulty in walking, not elsewhere classified: Secondary | ICD-10-CM | POA: Diagnosis not present

## 2019-07-28 DIAGNOSIS — M1990 Unspecified osteoarthritis, unspecified site: Secondary | ICD-10-CM | POA: Diagnosis not present

## 2019-08-02 DIAGNOSIS — M1990 Unspecified osteoarthritis, unspecified site: Secondary | ICD-10-CM | POA: Diagnosis not present

## 2019-08-02 DIAGNOSIS — R262 Difficulty in walking, not elsewhere classified: Secondary | ICD-10-CM | POA: Diagnosis not present

## 2019-08-02 DIAGNOSIS — R260 Ataxic gait: Secondary | ICD-10-CM | POA: Diagnosis not present

## 2019-08-02 DIAGNOSIS — M6281 Muscle weakness (generalized): Secondary | ICD-10-CM | POA: Diagnosis not present

## 2019-08-04 DIAGNOSIS — M1990 Unspecified osteoarthritis, unspecified site: Secondary | ICD-10-CM | POA: Diagnosis not present

## 2019-08-04 DIAGNOSIS — M6281 Muscle weakness (generalized): Secondary | ICD-10-CM | POA: Diagnosis not present

## 2019-08-04 DIAGNOSIS — R262 Difficulty in walking, not elsewhere classified: Secondary | ICD-10-CM | POA: Diagnosis not present

## 2019-08-04 DIAGNOSIS — R260 Ataxic gait: Secondary | ICD-10-CM | POA: Diagnosis not present

## 2019-08-08 DIAGNOSIS — Z1212 Encounter for screening for malignant neoplasm of rectum: Secondary | ICD-10-CM | POA: Diagnosis not present

## 2019-08-09 DIAGNOSIS — M1990 Unspecified osteoarthritis, unspecified site: Secondary | ICD-10-CM | POA: Diagnosis not present

## 2019-08-09 DIAGNOSIS — R262 Difficulty in walking, not elsewhere classified: Secondary | ICD-10-CM | POA: Diagnosis not present

## 2019-08-09 DIAGNOSIS — M6281 Muscle weakness (generalized): Secondary | ICD-10-CM | POA: Diagnosis not present

## 2019-08-09 DIAGNOSIS — R260 Ataxic gait: Secondary | ICD-10-CM | POA: Diagnosis not present

## 2019-08-11 DIAGNOSIS — R260 Ataxic gait: Secondary | ICD-10-CM | POA: Diagnosis not present

## 2019-08-11 DIAGNOSIS — M1990 Unspecified osteoarthritis, unspecified site: Secondary | ICD-10-CM | POA: Diagnosis not present

## 2019-08-11 DIAGNOSIS — M6281 Muscle weakness (generalized): Secondary | ICD-10-CM | POA: Diagnosis not present

## 2019-08-11 DIAGNOSIS — R262 Difficulty in walking, not elsewhere classified: Secondary | ICD-10-CM | POA: Diagnosis not present

## 2019-08-22 DIAGNOSIS — R260 Ataxic gait: Secondary | ICD-10-CM | POA: Diagnosis not present

## 2019-08-22 DIAGNOSIS — R262 Difficulty in walking, not elsewhere classified: Secondary | ICD-10-CM | POA: Diagnosis not present

## 2019-08-22 DIAGNOSIS — M6281 Muscle weakness (generalized): Secondary | ICD-10-CM | POA: Diagnosis not present

## 2019-08-22 DIAGNOSIS — M1990 Unspecified osteoarthritis, unspecified site: Secondary | ICD-10-CM | POA: Diagnosis not present

## 2019-08-25 DIAGNOSIS — M6281 Muscle weakness (generalized): Secondary | ICD-10-CM | POA: Diagnosis not present

## 2019-08-25 DIAGNOSIS — M1990 Unspecified osteoarthritis, unspecified site: Secondary | ICD-10-CM | POA: Diagnosis not present

## 2019-08-25 DIAGNOSIS — R260 Ataxic gait: Secondary | ICD-10-CM | POA: Diagnosis not present

## 2019-08-25 DIAGNOSIS — R262 Difficulty in walking, not elsewhere classified: Secondary | ICD-10-CM | POA: Diagnosis not present

## 2019-08-26 DIAGNOSIS — Z86010 Personal history of colonic polyps: Secondary | ICD-10-CM | POA: Diagnosis not present

## 2019-08-26 DIAGNOSIS — R195 Other fecal abnormalities: Secondary | ICD-10-CM | POA: Diagnosis not present

## 2019-08-30 DIAGNOSIS — R260 Ataxic gait: Secondary | ICD-10-CM | POA: Diagnosis not present

## 2019-08-30 DIAGNOSIS — M6281 Muscle weakness (generalized): Secondary | ICD-10-CM | POA: Diagnosis not present

## 2019-08-30 DIAGNOSIS — M1990 Unspecified osteoarthritis, unspecified site: Secondary | ICD-10-CM | POA: Diagnosis not present

## 2019-08-30 DIAGNOSIS — R262 Difficulty in walking, not elsewhere classified: Secondary | ICD-10-CM | POA: Diagnosis not present

## 2019-09-01 DIAGNOSIS — R260 Ataxic gait: Secondary | ICD-10-CM | POA: Diagnosis not present

## 2019-09-01 DIAGNOSIS — R262 Difficulty in walking, not elsewhere classified: Secondary | ICD-10-CM | POA: Diagnosis not present

## 2019-09-01 DIAGNOSIS — M1990 Unspecified osteoarthritis, unspecified site: Secondary | ICD-10-CM | POA: Diagnosis not present

## 2019-09-01 DIAGNOSIS — M6281 Muscle weakness (generalized): Secondary | ICD-10-CM | POA: Diagnosis not present

## 2019-09-06 DIAGNOSIS — M1990 Unspecified osteoarthritis, unspecified site: Secondary | ICD-10-CM | POA: Diagnosis not present

## 2019-09-06 DIAGNOSIS — M6281 Muscle weakness (generalized): Secondary | ICD-10-CM | POA: Diagnosis not present

## 2019-09-06 DIAGNOSIS — R262 Difficulty in walking, not elsewhere classified: Secondary | ICD-10-CM | POA: Diagnosis not present

## 2019-09-06 DIAGNOSIS — R260 Ataxic gait: Secondary | ICD-10-CM | POA: Diagnosis not present

## 2019-09-08 DIAGNOSIS — R260 Ataxic gait: Secondary | ICD-10-CM | POA: Diagnosis not present

## 2019-09-08 DIAGNOSIS — M1990 Unspecified osteoarthritis, unspecified site: Secondary | ICD-10-CM | POA: Diagnosis not present

## 2019-09-08 DIAGNOSIS — R262 Difficulty in walking, not elsewhere classified: Secondary | ICD-10-CM | POA: Diagnosis not present

## 2019-09-08 DIAGNOSIS — M6281 Muscle weakness (generalized): Secondary | ICD-10-CM | POA: Diagnosis not present

## 2019-10-18 ENCOUNTER — Encounter: Payer: Self-pay | Admitting: Cardiology

## 2019-10-18 NOTE — Telephone Encounter (Signed)
error 

## 2019-10-20 NOTE — Progress Notes (Signed)
Cardiology Office Note    Date:  10/21/2019   ID:  Joell, Usman 1946/01/22, MRN [id number]  PCP:  [name], MD  Cardiologist: Dr. Martinique   Chief Complaint  Patient presents with  . Coronary Artery Disease    History of Present Illness:    Camar Guyton is a 74 y.o. male with past medical history of CAD (s/p known occlusion of LAD and RCA by cath in 1992 with cath in 2017 showing similar findings with collateral flow noted), chronic combined systolic and diastolic CHF, HTN, HLD, Type 2 DM, GERD, and prior CVA.  He had a fall with vertebral fracture in December managed conservatively. He had some increased garbled speech in March that resolved. MRI showed multiple chronic strokes but no acute changes. Prior  MRI demonstrated a 4.6 cm AAA. Seen by Dr. Trula Slade and US showed 3.6 cm AAA. CTA done this June showed increase size to 5.7 cm. He subsequently underwent endovascular repair on 11/25/17. Follow up CT in August showed good repair.   On follow up he reports he is doing ok. Denies any chest pain, dyspnea, edema, palpitations, dizziness. BP at home is well controlled. A couple of months ago BP was dropping to 90 systolic so losartan was decreased to 25 mg daily. Notes more problems with his balance. Did get PT earlier in the year.  Past Medical History:  Diagnosis Date  . Abdominal aortic aneurysm (AAA) ([place])   . Aortic stenosis    a. 11/2015 Echo: EF 55-60%, Gr1 DD, mild AS.  Marland Kitchen Chronic combined systolic and diastolic CHF (congestive heart failure) ([place])    a. 07/2015: TEE showing EF of 40% b. 11/2015: Echo w/ EF 55-60%, Grade 1 DD, mild AS.  Marland Kitchen Coronary artery disease    a. patient reports 100% stenosis of LAD and RCA by cath in 1999. b. 11/2015 Cath: LM nl, LAD 100 - fills by L->L collats from D1, LCX 60d, OM1 lalrge/nl, OM2 small/nl, RCA 165m, L->R collats.EF 35-45% (55-60% by echo).  . Diabetes mellitus without complication (HCC)    Type II  . GERD  (gastroesophageal reflux disease)   . Hyperlipemia   . Hypertension   . Hypertensive heart disease   . Stroke [facility])    a. 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 Thrombocytopenia ([place]) 11/2015    Past Surgical History:  Procedure Laterality Date  . ABDOMINAL AORTIC ENDOVASCULAR STENT GRAFT N/A 11/25/2017   Procedure: ABDOMINAL AORTIC ENDOVASCULAR STENT GRAFT;  Surgeon: Serafina Mitchell, MD;  Location: [place];  Service: Vascular;  Laterality: N/A;  . CARDIAC CATHETERIZATION    . CARDIAC CATHETERIZATION N/A 11/16/2015   Procedure: Left Heart Cath and Coronary Angiography;  Surgeon: [name], MD;  Location: [facility] [place] [facility] CV [facility];  Service: Cardiovascular;  Laterality: N/A;  . MOLE REMOVAL    . TONSILLECTOMY      Current Medications: Outpatient Medications Prior to Visit  Medication Sig Dispense Refill  . acetaminophen (TYLENOL 8 HOUR) 650 MG CR tablet Take 650 mg by mouth every 8 (eight) hours as needed for pain.    Marland Kitchen atorvastatin (LIPITOR) 80 MG tablet Take 1 tablet (80 mg total) by mouth daily at 6 PM. 30 tablet 6  . carvedilol (COREG) 25 MG tablet Take 25 mg by mouth 2 (two) times daily with a meal.    . clopidogrel (PLAVIX) 75 MG tablet Take 1 tablet (75 mg total) by mouth daily. 30 tablet 1  .  insulin detemir (LEVEMIR) 100 UNIT/ML injection Inject 20 Units into the skin every morning.     Marland Kitchen losartan (COZAAR) 25 MG tablet Take 25 mg by mouth daily.    . nitroGLYCERIN (NITROSTAT) 0.4 MG SL tablet Place 0.4 mg under the tongue every 5 (five) minutes as needed for chest pain (x 3 doses).    . tacrolimus (PROTOPIC) 0.1 % ointment Apply 1 application topically daily as needed (facial scaling).    Marland Kitchen losartan (COZAAR) 50 MG tablet Take 1 tablet (50 mg total) by mouth daily. 90 tablet 2   No facility-administered medications prior to visit.     Allergies:   Cheese and Pravastatin   Social History   Socioeconomic History  . Marital status: Married    Spouse name: [name]  . Number  of children: 0  . Years of education: College  . Highest education level: Not on file  Occupational History  . Occupation: Retired   Tobacco Use  . Smoking status: Never Smoker  . Smokeless tobacco: Never Used  Vaping Use  . Vaping Use: Never used  Substance and Sexual Activity  . Alcohol use: Yes    Alcohol/week: 1.0 standard drink    Types: 1 Shots of liquor per week    Comment: cocktails every day  . Drug use: No  . Sexual activity: Not on file  Other Topics Concern  . Not on file  Social History Narrative   Drinks coffee daily    Social Determinants of Health   Financial Resource Strain:   . Difficulty of Paying Living Expenses:   Food Insecurity:   . Worried About Charity fundraiser in the Last Year:   . Arboriculturist in the Last Year:   Transportation Needs:   . Film/video editor (Medical):   Marland Kitchen Lack of Transportation (Non-Medical):   Physical Activity:   . Days of Exercise per Week:   . Minutes of Exercise per Session:   Stress:   . Feeling of Stress :   Social Connections:   . Frequency of Communication with Friends and Family:   . Frequency of Social Gatherings with Friends and Family:   . Attends Religious Services:   . Active Member of Clubs or Organizations:   . Attends Archivist Meetings:   Marland Kitchen Marital Status:      Family History:  The patient's family history includes Heart disease in his mother; Hypertension in his mother.   Review of Systems:   Please see the history of present illness.     All other systems reviewed and are otherwise negative except as noted above.   Physical Exam:    VS:  BP (!) 148/90   Pulse 65   Ht 5\' 9"  (1.753 m)   Wt 237 lb 9.6 oz (107.8 kg)   SpO2 100%   BMI 35.09 kg/m    GENERAL:  Well appearing obese WM in NAD. Walks with cane.  HEENT:  PERRL, EOMI, sclera are clear. Oropharynx is clear. NECK:  No jugular venous distention, carotid upstroke brisk and symmetric, no bruits, no thyromegaly or  adenopathy LUNGS:  Clear to auscultation bilaterally CHEST:  Unremarkable HEART:  RRR,  PMI not displaced or sustained,S1 and S2 within normal limits, no S3, no S4: no clicks, no rubs, no murmurs ABD:  Soft, nontender. BS +, no masses or bruits. No hepatomegaly, no splenomegaly EXT:  2 + pulses throughout, no edema, no cyanosis no clubbing SKIN:  Warm and dry.  No rashes NEURO:  Alert and oriented x 3. Cranial nerves II through XII intact. PSYCH:  Cognitively intact       Wt Readings from Last 3 Encounters:  10/21/19 237 lb 9.6 oz (107.8 kg)  06/17/19 232 lb (105.2 kg)  04/21/19 229 lb 8 oz (104.1 kg)     Studies/Labs Reviewed:   EKG:  EKG is ordered today. NSR old anterior MI. Rate 65. No change. I have personally reviewed and interpreted this study.   Recent Labs: No results found for requested labs within last 8760 hours.   Lipid Panel    Component Value Date/Time   CHOL 151 11/16/2015 0702   TRIG 57 11/16/2015 0702   HDL 46 11/16/2015 0702   CHOLHDL 3.3 11/16/2015 0702   VLDL 11 11/16/2015 0702   LDLCALC 94 11/16/2015 0702   Labs dated 01/21/17: cholesterol 144, triglycerides 69, HDL 54, LDL 76. Dated 10/07/17: A1c 5.2%.  Dated 11/26/17: plts 94K. Hgb 13.4. Chemistries normal. A1c 5.7% Dated 06/29/19: A1c 5.4%. Dated 07/26/19: cholesterol 139, triglycerides 55, HDL 59, LDL 69. Creatinine 1.1. plts 94K. Otherwise CMET, CBC, TSH normal.    Additional studies/ records that were reviewed today include:   Cardiac Catheterization: 11/2015 1. Mid RCA lesion, 100% stenosed. 2. Ost 1st Diag to 1st Diag lesion, 70% stenosed. 3. Mid LAD lesion, 100% stenosed. 4. Dist RCA lesion, 100% stenosed. 5. Dist Cx lesion, 60% stenosed.    Total occlusion of the proximal RCA. RCA is heavily calcified. RCA fills by collaterals from the circumflex coronary of the left system. The right coronary is large in distribution.  Total occlusion of the proximal to mid LAD after the origin of  the first of perforator and first diagonal. Left to left collateral supply the LAD and a second diagonal. The first diagonal contains eccentric 50-70% narrowing.  Widely patent circumflex including a small ramus intermedius/first obtuse marginal, and a large branching second obtuse marginal. The distal circumflex beyond the second marginal is small and contains 50-70% narrowing. The circumflex is the source of collaterals to the distal right coronary.  Mildly depressed LV function with estimated EF in the 35 to 40% range. LVEDP is mildly elevated. The inferior wall is hypokinetic.   RECOMMENDATIONS:  Images were reviewed with Dr. Martinique. The plan at this time is to continue medical therapy unless symptoms progress.  Aggressive risk factor modification.  Heart failure therapy as indicated.  Echocardiogram: 11/2015 Study Conclusions  - Left ventricle: The cavity size was normal. Systolic function was   normal. The estimated ejection fraction was in the range of 55%   to 60%. Wall motion was normal; there were no regional wall   motion abnormalities. There was an increased relative   contribution of atrial contraction to ventricular filling.   Doppler parameters are consistent with abnormal left ventricular   relaxation (grade 1 diastolic dysfunction). - Aortic valve: Moderately calcified annulus. Trileaflet. Moderate   diffuse thickening and calcification. There was mild stenosis.   Valve area (VTI): 2.17 cm^2. Valve area (Vmax): 1.88 cm^2. Valve   area (Vmean): 1.75 cm^2.   Assessment:    No diagnosis found.   Plan:   In order of problems listed above:  1. CAD - the patient has known occlusion of the LAD and RCA by cath in 1992 with repeat cath in 2017 showing similar findings with collateral flow noted.  - he denies any recent chest pain or dyspnea on exertion.  - continue Plavix, BB, and statin therapy. -  may hold Plavix for 5 days for upcoming colonoscopy  2.  Chronic Combined Systolic and Diastolic CHF - cath in 02/2547 showed a reduced EF of 35-40% but EF was preserved at 55-60% by echo at that time.  - no evidence of volume overload.  - continue Coreg 25mg  BID and Losartan 25 mg daily.   3. HTN - BP mildly elevated some today but home readings look good.   4. HLD -  Last LDL 69.  -  Remains on high-dose Atorvastatin 80mg  daily.  5. Type 2 DM -  Followed by PCP.   6. AAA -  S/p endovascular repair in July 2019.  Follow up in 6 months.    Signed, Amirr Achord Martinique, MD  10/21/2019 10:29 AM    [facility] [address], [place], [place] [address] Phone: [phone]

## 2019-10-21 ENCOUNTER — Encounter: Payer: Self-pay | Admitting: Cardiology

## 2019-10-21 ENCOUNTER — Ambulatory Visit (INDEPENDENT_AMBULATORY_CARE_PROVIDER_SITE_OTHER): Payer: Medicare Other | Admitting: Cardiology

## 2019-10-21 ENCOUNTER — Other Ambulatory Visit: Payer: Self-pay

## 2019-10-21 VITALS — BP 148/90 | HR 65 | Ht 69.0 in | Wt 237.6 lb

## 2019-10-21 DIAGNOSIS — E7800 Pure hypercholesterolemia, unspecified: Secondary | ICD-10-CM | POA: Diagnosis not present

## 2019-10-21 DIAGNOSIS — I10 Essential (primary) hypertension: Secondary | ICD-10-CM

## 2019-10-21 DIAGNOSIS — I5042 Chronic combined systolic (congestive) and diastolic (congestive) heart failure: Secondary | ICD-10-CM | POA: Diagnosis not present

## 2019-10-21 DIAGNOSIS — I714 Abdominal aortic aneurysm, without rupture, unspecified: Secondary | ICD-10-CM

## 2019-10-21 DIAGNOSIS — I2510 Atherosclerotic heart disease of native coronary artery without angina pectoris: Secondary | ICD-10-CM

## 2019-10-21 DIAGNOSIS — Z8673 Personal history of transient ischemic attack (TIA), and cerebral infarction without residual deficits: Secondary | ICD-10-CM

## 2019-10-21 NOTE — Patient Instructions (Signed)
Medication Instructions:  Your physician recommends that you continue on your current medications as directed. Please refer to the Current Medication list given to you today.  *If you need a refill on your cardiac medications before your next appointment, please call your pharmacy*  Lab Work: NONE If you have labs (blood work) drawn today and your tests are completely normal, you will receive your results only by: . MyChart Message (if you have MyChart) OR . A paper copy in the mail If you have any lab test that is abnormal or we need to change your treatment, we will call you to review the results.  Testing/Procedures: NONE  Follow-Up: At [facility], you and your health needs are our priority.  As part of our continuing mission to provide you with exceptional heart care, we have created designated Provider Care Teams.  These Care Teams include your primary Cardiologist (physician) and Advanced Practice Providers (APPs -  Physician Assistants and Nurse Practitioners) who all work together to provide you with the care you need, when you need it.  We recommend signing up for the patient portal called "MyChart".  Sign up information is provided on this After Visit Summary.  MyChart is used to connect with patients for Virtual Visits (Telemedicine).  Patients are able to view lab/test results, encounter notes, upcoming appointments, etc.  Non-urgent messages can be sent to your provider as well.   To learn more about what you can do with MyChart, go to [email or web address].    Your next appointment:   6 month(s)  The format for your next appointment:   In Person  Provider:   You may see Peter Jordan, MD or one of the following Advanced Practice Providers on your designated Care Team:    Hao Meng, PA-C  Angela Duke, PA-C or   Krista Kroeger, PA-C    

## 2019-11-03 DIAGNOSIS — K219 Gastro-esophageal reflux disease without esophagitis: Secondary | ICD-10-CM | POA: Diagnosis not present

## 2019-12-14 DIAGNOSIS — R195 Other fecal abnormalities: Secondary | ICD-10-CM | POA: Diagnosis not present

## 2020-01-02 DIAGNOSIS — D2222 Melanocytic nevi of left ear and external auricular canal: Secondary | ICD-10-CM | POA: Diagnosis not present

## 2020-01-02 DIAGNOSIS — L814 Other melanin hyperpigmentation: Secondary | ICD-10-CM | POA: Diagnosis not present

## 2020-01-02 DIAGNOSIS — D225 Melanocytic nevi of trunk: Secondary | ICD-10-CM | POA: Diagnosis not present

## 2020-01-02 DIAGNOSIS — L570 Actinic keratosis: Secondary | ICD-10-CM | POA: Diagnosis not present

## 2020-01-02 DIAGNOSIS — L578 Other skin changes due to chronic exposure to nonionizing radiation: Secondary | ICD-10-CM | POA: Diagnosis not present

## 2020-01-02 DIAGNOSIS — Z85828 Personal history of other malignant neoplasm of skin: Secondary | ICD-10-CM | POA: Diagnosis not present

## 2020-01-02 DIAGNOSIS — Z86018 Personal history of other benign neoplasm: Secondary | ICD-10-CM | POA: Diagnosis not present

## 2020-01-02 DIAGNOSIS — L821 Other seborrheic keratosis: Secondary | ICD-10-CM | POA: Diagnosis not present

## 2020-02-10 DIAGNOSIS — Z23 Encounter for immunization: Secondary | ICD-10-CM | POA: Diagnosis not present

## 2020-02-25 DIAGNOSIS — Z23 Encounter for immunization: Secondary | ICD-10-CM | POA: Diagnosis not present

## 2020-03-28 DIAGNOSIS — I10 Essential (primary) hypertension: Secondary | ICD-10-CM | POA: Diagnosis not present

## 2020-03-28 DIAGNOSIS — Z23 Encounter for immunization: Secondary | ICD-10-CM | POA: Diagnosis not present

## 2020-03-28 DIAGNOSIS — I69959 Hemiplegia and hemiparesis following unspecified cerebrovascular disease affecting unspecified side: Secondary | ICD-10-CM | POA: Diagnosis not present

## 2020-03-28 DIAGNOSIS — E669 Obesity, unspecified: Secondary | ICD-10-CM | POA: Diagnosis not present

## 2020-03-28 DIAGNOSIS — E1151 Type 2 diabetes mellitus with diabetic peripheral angiopathy without gangrene: Secondary | ICD-10-CM | POA: Diagnosis not present

## 2020-04-17 DIAGNOSIS — E119 Type 2 diabetes mellitus without complications: Secondary | ICD-10-CM | POA: Diagnosis not present

## 2020-05-06 NOTE — Progress Notes (Signed)
Cardiology Office Note    Date:  05/07/2020   ID:  Jose Beck [birth year], MRN [place]:[id number]  PCP:  [name], MD  Cardiologist: Dr. Martinique   Chief Complaint  Patient presents with  . Coronary Artery Disease    History of Present Illness:    Jose Beck is a 74 y.o. male with past medical history of CAD (s/p known occlusion of LAD and RCA by cath in 1992 with cath in 2017 showing similar findings with collateral flow noted), chronic combined systolic and diastolic CHF, HTN, HLD, Type 2 DM, GERD, and prior CVA.  He had a fall with vertebral fracture in December managed conservatively. He had some increased garbled speech in March that resolved. MRI showed multiple chronic strokes but no acute changes. Prior  MRI demonstrated a 4.6 cm AAA. Seen by Dr. Trula Slade and US showed 3.6 cm AAA. CTA done this June showed increase size to 5.7 cm. He subsequently underwent endovascular repair on 11/25/17.  Followed yearly.   On follow up he reports he is doing ok. Denies any chest pain, dyspnea, edema, palpitations, dizziness. BP at home is well controlled. He is doing some neurologic exercises to maintain his balance but doesn't get any aerobic exercise.   Past Medical History:  Diagnosis Date  . Abdominal aortic aneurysm (AAA) ([place])   . Aortic stenosis    a. 11/2015 Echo: EF 55-60%, Gr1 DD, mild AS.  Marland Kitchen Chronic combined systolic and diastolic CHF (congestive heart failure) ([place])    a. 07/2015: TEE showing EF of 40% b. 11/2015: Echo w/ EF 55-60%, Grade 1 DD, mild AS.  Marland Kitchen Coronary artery disease    a. patient reports 100% stenosis of LAD and RCA by cath in 1999. b. 11/2015 Cath: LM nl, LAD 100 - fills by L->L collats from D1, LCX 60d, OM1 lalrge/nl, OM2 small/nl, RCA 155m, L->R collats.EF 35-45% (55-60% by echo).  . Diabetes mellitus without complication (HCC)    Type II  . GERD (gastroesophageal reflux disease)   . Hyperlipemia   . Hypertension   . Hypertensive heart  disease   . Stroke [facility])    a. 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 Thrombocytopenia ([place]) 11/2015    Past Surgical History:  Procedure Laterality Date  . ABDOMINAL AORTIC ENDOVASCULAR STENT GRAFT N/A 11/25/2017   Procedure: ABDOMINAL AORTIC ENDOVASCULAR STENT GRAFT;  Surgeon: Serafina Mitchell, MD;  Location: [place];  Service: Vascular;  Laterality: N/A;  . CARDIAC CATHETERIZATION    . CARDIAC CATHETERIZATION N/A 11/16/2015   Procedure: Left Heart Cath and Coronary Angiography;  Surgeon: [name], MD;  Location: [place] [facility];  Service: Cardiovascular;  Laterality: N/A;  . MOLE REMOVAL    . TONSILLECTOMY      Current Medications: Outpatient Medications Prior to Visit  Medication Sig Dispense Refill  . acetaminophen (TYLENOL) 650 MG CR tablet Take 650 mg by mouth every 8 (eight) hours as needed for pain.    Marland Kitchen atorvastatin (LIPITOR) 80 MG tablet Take 1 tablet (80 mg total) by mouth daily at 6 PM. 30 tablet 6  . carvedilol (COREG) 25 MG tablet Take 25 mg by mouth 2 (two) times daily with a meal.    . clopidogrel (PLAVIX) 75 MG tablet Take 1 tablet (75 mg total) by mouth daily. 30 tablet 1  . insulin detemir (LEVEMIR) 100 UNIT/ML injection Inject 20 Units into the skin every morning.     Marland Kitchen losartan (  COZAAR) 25 MG tablet Take 25 mg by mouth daily.    . nitroGLYCERIN (NITROSTAT) 0.4 MG SL tablet Place 0.4 mg under the tongue every 5 (five) minutes as needed for chest pain (x 3 doses).    . tacrolimus (PROTOPIC) 0.1 % ointment Apply 1 application topically daily as needed (facial scaling).     No facility-administered medications prior to visit.     Allergies:   Cheese and Pravastatin   Social History   Socioeconomic History  . Marital status: Married    Spouse name: [name]  . Number of children: 0  . Years of education: College  . Highest education level: Not on file  Occupational History  . Occupation: Retired   Tobacco Use  . Smoking status: Never Smoker  .  Smokeless tobacco: Never Used  Vaping Use  . Vaping Use: Never used  Substance and Sexual Activity  . Alcohol use: Yes    Alcohol/week: 1.0 standard drink    Types: 1 Shots of liquor per week    Comment: cocktails every day  . Drug use: No  . Sexual activity: Not on file  Other Topics Concern  . Not on file  Social History Narrative   Drinks coffee daily    Social Determinants of Health   Financial Resource Strain: Not on file  Food Insecurity: Not on file  Transportation Needs: Not on file  Physical Activity: Not on file  Stress: Not on file  Social Connections: Not on file     Family History:  The patient's family history includes Heart disease in his mother; Hypertension in his mother.   Review of Systems:   Please see the history of present illness.     All other systems reviewed and are otherwise negative except as noted above.   Physical Exam:    VS:  BP 140/77   Pulse 65   Temp (!) 96.8 F (36 C)   Ht 5\' 7"  (1.702 m)   Wt 243 lb (110.2 kg)   SpO2 99%   BMI 38.06 kg/m    GENERAL:  Well appearing obese WM in NAD. Walks with cane.  HEENT:  PERRL, EOMI, sclera are clear. Oropharynx is clear. NECK:  No jugular venous distention, carotid upstroke brisk and symmetric, no bruits, no thyromegaly or adenopathy LUNGS:  Clear to auscultation bilaterally CHEST:  Unremarkable HEART:  RRR,  PMI not displaced or sustained,S1 and S2 within normal limits, no S3, no S4: soft 1/6 systolic murmur RUSB. ABD:  Soft, nontender. BS +, no masses or bruits. No hepatomegaly, no splenomegaly EXT:  2 + pulses throughout, no edema, no cyanosis no clubbing SKIN:  Warm and dry.  No rashes NEURO:  Alert and oriented x 3. Cranial nerves II through XII intact. PSYCH:  Cognitively intact       Wt Readings from Last 3 Encounters:  05/07/20 243 lb (110.2 kg)  10/21/19 237 lb 9.6 oz (107.8 kg)  06/17/19 232 lb (105.2 kg)     Studies/Labs Reviewed:   EKG:  EKG is not ordered  today.   Recent Labs: No results found for requested labs within last 8760 hours.   Lipid Panel    Component Value Date/Time   CHOL 151 11/16/2015 0702   TRIG 57 11/16/2015 0702   HDL 46 11/16/2015 0702   CHOLHDL 3.3 11/16/2015 0702   VLDL 11 11/16/2015 0702   LDLCALC 94 11/16/2015 0702   Labs dated 01/21/17: cholesterol 144, triglycerides 69, HDL 54, LDL 76. Dated  10/07/17: A1c 5.2%.  Dated 11/26/17: plts 94K. Hgb 13.4. Chemistries normal. A1c 5.7% Dated 06/29/19: A1c 5.4%. Dated 07/26/19: cholesterol 139, triglycerides 55, HDL 59, LDL 69. Creatinine 1.1. plts 94K. Otherwise CMET, CBC, TSH normal.  Dated 11/07/19: A1c 5.4%  Additional studies/ records that were reviewed today include:   Cardiac Catheterization: 11/2015 1. Mid RCA lesion, 100% stenosed. 2. Ost 1st Diag to 1st Diag lesion, 70% stenosed. 3. Mid LAD lesion, 100% stenosed. 4. Dist RCA lesion, 100% stenosed. 5. Dist Cx lesion, 60% stenosed.    Total occlusion of the proximal RCA. RCA is heavily calcified. RCA fills by collaterals from the circumflex coronary of the left system. The right coronary is large in distribution.  Total occlusion of the proximal to mid LAD after the origin of the first of perforator and first diagonal. Left to left collateral supply the LAD and a second diagonal. The first diagonal contains eccentric 50-70% narrowing.  Widely patent circumflex including a small ramus intermedius/first obtuse marginal, and a large branching second obtuse marginal. The distal circumflex beyond the second marginal is small and contains 50-70% narrowing. The circumflex is the source of collaterals to the distal right coronary.  Mildly depressed LV function with estimated EF in the 35 to 40% range. LVEDP is mildly elevated. The inferior wall is hypokinetic.   RECOMMENDATIONS:  Images were reviewed with Dr. Martinique. The plan at this time is to continue medical therapy unless symptoms progress.  Aggressive risk  factor modification.  Heart failure therapy as indicated.  Echocardiogram: 11/2015 Study Conclusions  - Left ventricle: The cavity size was normal. Systolic function was   normal. The estimated ejection fraction was in the range of 55%   to 60%. Wall motion was normal; there were no regional wall   motion abnormalities. There was an increased relative   contribution of atrial contraction to ventricular filling.   Doppler parameters are consistent with abnormal left ventricular   relaxation (grade 1 diastolic dysfunction). - Aortic valve: Moderately calcified annulus. Trileaflet. Moderate   diffuse thickening and calcification. There was mild stenosis.   Valve area (VTI): 2.17 cm^2. Valve area (Vmax): 1.88 cm^2. Valve   area (Vmean): 1.75 cm^2.   Assessment:    1. Coronary artery disease involving native coronary artery of native heart without angina pectoris   2. Essential hypertension   3. AAA (abdominal aortic aneurysm) without rupture ([place])   4. Hypercholesterolemia      Plan:   In order of problems listed above:  1. CAD - the patient has known occlusion of the LAD and RCA by cath in 1992 with repeat cath in 2017 showing similar findings with collateral flow  - he denies any chest pain or dyspnea on exertion.  - continue Plavix, BB, and statin therapy.   2. Chronic Combined Systolic and Diastolic CHF - cath in 0000000 showed a reduced EF of 35-40% but EF was preserved at 55-60% by echo at that time.  - no evidence of volume overload.  - continue Coreg 25mg  BID and Losartan 25 mg daily.   3. HTN - BP generally controlled.  4. HLD -  Last LDL 69.  -  Remains on high-dose Atorvastatin 80mg  daily.  5. Type 2 DM -  Followed by PCP. Last A1c 5.4%.   6. AAA -  S/p endovascular repair in July 2019. Followed by Dr Trula Slade  Follow up in 6 months.    Signed, Somnang Mahan Martinique, MD  05/07/2020 1:40 PM    [facility] [address] [place], [place] [address] Phone: [phone]

## 2020-05-07 ENCOUNTER — Encounter: Payer: Self-pay | Admitting: Cardiology

## 2020-05-07 ENCOUNTER — Other Ambulatory Visit: Payer: Self-pay

## 2020-05-07 ENCOUNTER — Ambulatory Visit (INDEPENDENT_AMBULATORY_CARE_PROVIDER_SITE_OTHER): Payer: Medicare Other | Admitting: Cardiology

## 2020-05-07 VITALS — BP 140/77 | HR 65 | Temp 96.8°F | Ht 67.0 in | Wt 243.0 lb

## 2020-05-07 DIAGNOSIS — E7800 Pure hypercholesterolemia, unspecified: Secondary | ICD-10-CM | POA: Diagnosis not present

## 2020-05-07 DIAGNOSIS — I2510 Atherosclerotic heart disease of native coronary artery without angina pectoris: Secondary | ICD-10-CM | POA: Diagnosis not present

## 2020-05-07 DIAGNOSIS — I714 Abdominal aortic aneurysm, without rupture, unspecified: Secondary | ICD-10-CM

## 2020-05-07 DIAGNOSIS — I10 Essential (primary) hypertension: Secondary | ICD-10-CM | POA: Diagnosis not present

## 2020-06-19 ENCOUNTER — Ambulatory Visit (INDEPENDENT_AMBULATORY_CARE_PROVIDER_SITE_OTHER): Payer: Medicare Other | Admitting: Physician Assistant

## 2020-06-19 ENCOUNTER — Other Ambulatory Visit: Payer: Self-pay

## 2020-06-19 ENCOUNTER — Ambulatory Visit (HOSPITAL_COMMUNITY)
Admission: RE | Admit: 2020-06-19 | Discharge: 2020-06-19 | Disposition: A | Discharge status: Home / Self Care | Payer: Medicare Other | Source: Ambulatory Visit | Attending: Physician Assistant | Admitting: Physician Assistant

## 2020-06-19 VITALS — BP 169/85 | HR 62 | Temp 98.0°F | Resp 20 | Ht 67.0 in | Wt 241.6 lb

## 2020-06-19 DIAGNOSIS — I713 Abdominal aortic aneurysm, ruptured, unspecified: Secondary | ICD-10-CM

## 2020-06-19 NOTE — Progress Notes (Signed)
Established EVAR   History of Present Illness   Jose Beck is a 75 y.o. ([birth year]) male who presents for routine follow up s/p EVAR (Date: 11/25/17) by Dr. Trula Slade.  He denies new or changing abdominal or back pain.  He also denies any claudication, rest pain, or nonhealing wounds of bilateral lower extremities.  He is taking Plavix daily.  He is on Lipitor daily.  The patient's PMH, PSH, SH, and FamHx were reviewed and are unchanged from prior visit.  Current Outpatient Medications  Medication Sig Dispense Refill  . acetaminophen (TYLENOL) 650 MG CR tablet Take 650 mg by mouth every 8 (eight) hours as needed for pain.    Marland Kitchen atorvastatin (LIPITOR) 80 MG tablet Take 1 tablet (80 mg total) by mouth daily at 6 PM. 30 tablet 6  . carvedilol (COREG) 25 MG tablet Take 25 mg by mouth 2 (two) times daily with a meal.    . clopidogrel (PLAVIX) 75 MG tablet Take 1 tablet (75 mg total) by mouth daily. 30 tablet 1  . insulin detemir (LEVEMIR) 100 UNIT/ML injection Inject 20 Units into the skin every morning.     Marland Kitchen losartan (COZAAR) 25 MG tablet Take 25 mg by mouth daily.    . nitroGLYCERIN (NITROSTAT) 0.4 MG SL tablet Place 0.4 mg under the tongue every 5 (five) minutes as needed for chest pain (x 3 doses).    . tacrolimus (PROTOPIC) 0.1 % ointment Apply 1 application topically daily as needed (facial scaling).     No current facility-administered medications for this visit.    REVIEW OF SYSTEMS (negative unless checked):   Cardiac:  []  Chest pain or chest pressure? []  Shortness of breath upon activity? []  Shortness of breath when lying flat? []  Irregular heart rhythm?  Vascular:  []  Pain in calf, thigh, or hip brought on by walking? []  Pain in feet at night that wakes you up from your sleep? []  Blood clot in your veins? []  Leg swelling?  Pulmonary:  []  Oxygen at home? []  Productive cough? []  Wheezing?  Neurologic:  []  Sudden weakness in arms or legs? []  Sudden numbness  in arms or legs? []  Sudden onset of difficult speaking or slurred speech? []  Temporary loss of vision in one eye? []  Problems with dizziness?  Gastrointestinal:  []  Blood in stool? []  Vomited blood?  Genitourinary:  []  Burning when urinating? []  Blood in urine?  Psychiatric:  []  Major depression  Hematologic:  []  Bleeding problems? []  Problems with blood clotting?  Dermatologic:  []  Rashes or ulcers?  Constitutional:  []  Fever or chills?  Ear/Nose/Throat:  []  Change in hearing? []  Nose bleeds? []  Sore throat?  Musculoskeletal:  []  Back pain? []  Joint pain? []  Muscle pain?   Physical Examination   Vitals:   06/19/20 0926  BP: (!) 169/85  Pulse: 62  Resp: 20  Temp: 98 F (36.7 C)  TempSrc: Temporal  SpO2: 100%  Weight: 241 lb 9.6 oz (109.6 kg)  Height: 5\' 7"  (1.702 m)   Body mass index is 37.84 kg/m.  General:  WDWN in NAD; vital signs documented above Gait: Not observed HENT: WNL, normocephalic Pulmonary: normal non-labored breathing , without Rales, rhonchi,  wheezing Cardiac: regular HR Abdomen: soft, NT, no masses Skin: without rashes Vascular Exam/Pulses:  Right Left  Radial 2+ (normal) 2+ (normal)  DP 2+ (normal) 1+ (weak)  PT absent absent   Extremities: without ischemic changes, without Gangrene , without cellulitis; without open wounds;  Musculoskeletal: no  muscle wasting or atrophy  Neurologic: A&O X 3;  No focal weakness or paresthesias are detected Psychiatric:  The pt has Normal affect.  Non-Invasive Vascular Imaging   EVAR Duplex   AAA sac size: 4.37 cm   no endoleak detected   Medical Decision Making   Jose Beck is a 75 y.o. male who presents s/p EVAR for surveillance   The AAA sac size continues to decrease now measuring 4.37 cm  No endoleak detected on duplex  Continue aspirin and statin daily  Recheck EVAR duplex in 1 year per protocol   Jose Ligas PA-C [facility] Office: [phone]  Clinic MD: Carlis Abbott

## 2020-08-06 DIAGNOSIS — E785 Hyperlipidemia, unspecified: Secondary | ICD-10-CM | POA: Diagnosis not present

## 2020-08-06 DIAGNOSIS — E1151 Type 2 diabetes mellitus with diabetic peripheral angiopathy without gangrene: Secondary | ICD-10-CM | POA: Diagnosis not present

## 2020-08-06 DIAGNOSIS — Z125 Encounter for screening for malignant neoplasm of prostate: Secondary | ICD-10-CM | POA: Diagnosis not present

## 2020-08-13 DIAGNOSIS — E669 Obesity, unspecified: Secondary | ICD-10-CM | POA: Diagnosis not present

## 2020-08-13 DIAGNOSIS — E785 Hyperlipidemia, unspecified: Secondary | ICD-10-CM | POA: Diagnosis not present

## 2020-08-13 DIAGNOSIS — I10 Essential (primary) hypertension: Secondary | ICD-10-CM | POA: Diagnosis not present

## 2020-08-13 DIAGNOSIS — I129 Hypertensive chronic kidney disease with stage 1 through stage 4 chronic kidney disease, or unspecified chronic kidney disease: Secondary | ICD-10-CM | POA: Diagnosis not present

## 2020-08-13 DIAGNOSIS — D693 Immune thrombocytopenic purpura: Secondary | ICD-10-CM | POA: Diagnosis not present

## 2020-08-13 DIAGNOSIS — Z0000 Encounter for general adult medical examination without abnormal findings: Secondary | ICD-10-CM | POA: Diagnosis not present

## 2020-08-13 DIAGNOSIS — Z1331 Encounter for screening for depression: Secondary | ICD-10-CM | POA: Diagnosis not present

## 2020-08-13 DIAGNOSIS — D696 Thrombocytopenia, unspecified: Secondary | ICD-10-CM | POA: Diagnosis not present

## 2020-08-13 DIAGNOSIS — E1151 Type 2 diabetes mellitus with diabetic peripheral angiopathy without gangrene: Secondary | ICD-10-CM | POA: Diagnosis not present

## 2020-08-13 DIAGNOSIS — R972 Elevated prostate specific antigen [PSA]: Secondary | ICD-10-CM | POA: Diagnosis not present

## 2020-08-13 DIAGNOSIS — Z1339 Encounter for screening examination for other mental health and behavioral disorders: Secondary | ICD-10-CM | POA: Diagnosis not present

## 2020-08-13 DIAGNOSIS — N1831 Chronic kidney disease, stage 3a: Secondary | ICD-10-CM | POA: Diagnosis not present

## 2020-08-13 DIAGNOSIS — I69959 Hemiplegia and hemiparesis following unspecified cerebrovascular disease affecting unspecified side: Secondary | ICD-10-CM | POA: Diagnosis not present

## 2020-10-01 DIAGNOSIS — H20012 Primary iridocyclitis, left eye: Secondary | ICD-10-CM | POA: Diagnosis not present

## 2020-10-04 ENCOUNTER — Other Ambulatory Visit: Payer: Self-pay

## 2020-10-04 ENCOUNTER — Ambulatory Visit: Payer: Medicare Other | Attending: Internal Medicine

## 2020-10-04 VITALS — BP 128/78

## 2020-10-04 DIAGNOSIS — M6281 Muscle weakness (generalized): Secondary | ICD-10-CM | POA: Diagnosis not present

## 2020-10-04 DIAGNOSIS — R2681 Unsteadiness on feet: Secondary | ICD-10-CM | POA: Diagnosis not present

## 2020-10-04 DIAGNOSIS — R2689 Other abnormalities of gait and mobility: Secondary | ICD-10-CM

## 2020-10-04 DIAGNOSIS — I69351 Hemiplegia and hemiparesis following cerebral infarction affecting right dominant side: Secondary | ICD-10-CM

## 2020-10-05 NOTE — Therapy (Signed)
[facility] [address] [place], [name], [address] Phone: [phone]   Fax:  [phone]  Physical Therapy Evaluation  Patient Details  Name: Jose Beck MRN: [id number] Date of Birth: 01-06-1946 Referring Provider (PT): [name]   Encounter Date: 10/04/2020   PT End of Session - 10/04/20 0936    Visit Number 1    Number of Visits 17    Date for PT Re-Evaluation 01/03/21   60 day poc, 90 day cert   Authorization Type medicare so 10th visit progress note    PT Start Time 0932    PT Stop Time 1018    PT Time Calculation (min) 46 min    Activity Tolerance Patient tolerated treatment well    Behavior During Therapy [facility] for tasks assessed/performed           Past Medical History:  Diagnosis Date  . Abdominal aortic aneurysm (AAA) ([place])   . Aortic stenosis    a. 11/2015 Echo: EF 55-60%, Gr1 DD, mild AS.  Marland Kitchen Chronic combined systolic and diastolic CHF (congestive heart failure) ([place])    a. 07/2015: TEE showing EF of 40% b. 11/2015: Echo w/ EF 55-60%, Grade 1 DD, mild AS.  Marland Kitchen Coronary artery disease    a. patient reports 100% stenosis of LAD and RCA by cath in 1999. b. 11/2015 Cath: LM nl, LAD 100 - fills by L->L collats from D1, LCX 60d, OM1 lalrge/nl, OM2 small/nl, RCA 124m, L->R collats.EF 35-45% (55-60% by echo).  . Diabetes mellitus without complication (HCC)    Type II  . GERD (gastroesophageal reflux disease)   . Hyperlipemia   . Hypertension   . Hypertensive heart disease   . Stroke [facility])    a. 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 Thrombocytopenia ([place]) 11/2015    Past Surgical History:  Procedure Laterality Date  . ABDOMINAL AORTIC ENDOVASCULAR STENT GRAFT N/A 11/25/2017   Procedure: ABDOMINAL AORTIC ENDOVASCULAR STENT GRAFT;  Surgeon: Serafina Mitchell, MD;  Location: [facility];  Service: Vascular;  Laterality: N/A;  . CARDIAC CATHETERIZATION    . CARDIAC CATHETERIZATION N/A 11/16/2015    Procedure: Left Heart Cath and Coronary Angiography;  Surgeon: [name], MD;  Location: [facility] CV LAB;  Service: Cardiovascular;  Laterality: N/A;  . MOLE REMOVAL    . TONSILLECTOMY      Vitals:   10/04/20 1004  BP: 128/78      Subjective Assessment - 10/04/20 0936    Subjective Pt presents with history of CVA in 2017. Pt has right hemiparesis. He reports that his balance isn't great and wife feels that he walks slow. Pt walks with cane when leaves home. He tries not to use as much in home but does touch things at times. Pt reports 1 fall in last 6 months when fell over the chair. Pt reports that his memory is good but he does have some delayed speech responses. Pt continues to work on his speech exercises from past therapy in home. 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 Also has a sit to stand exercise, alternating taps, and heel to toe movements.    Patient is accompained by: Family member   wife, [name]   Pertinent History PMH: DM2, HTN, OA, ischemic CVA March 2017    Patient Stated Goals Pt would like to improve his balance and walking some.    Currently in  Pain? No/denies              [facility] PT Assessment - 10/04/20 0946      Assessment   Medical Diagnosis hemiplegia and hemiparesis from CVA    Referring Provider (PT) [name]    Onset Date/Surgical Date 09/28/20   referral date but chronic CVA   Hand Dominance Right    Prior Therapy prior outpatient PT      Precautions   Precautions Fall      Balance Screen   Has the patient fallen in the past 6 months Yes    How many times? 1    Has the patient had a decrease in activity level because of a fear of falling?  No    Is the patient reluctant to leave their home because of a fear of falling?  No      Home Environment   Living Environment Private residence    Living Arrangements Spouse/significant other    Available Help at Discharge Family     Type of [place] to enter    Entrance Stairs-Number of Steps 2    Entrance Stairs-Rails Right;Left;Can reach both   on front but none in garage   [place] Two level;Able to live on main level with bedroom/bathroom    Alternate Level Stairs-Number of Steps 12    Alternate Level Stairs-Rails Left    Home Equipment Cane - single point;Walker - 2 wheels;Shower seat      Prior Function   Level of Independence Independent with community mobility without device   prior to stroke   Vocation Retired    Leisure read      Development worker, international aid   Gross Motor Movements are Fluid and Coordinated No   RAMs on right impaired   Fine Motor Movements are Fluid and Coordinated No   impaired right finger opposition     ROM / Strength   AROM / PROM / Strength Strength      Strength   Strength Assessment Site Shoulder;Elbow;Hand;Hip;Knee;Ankle    Right/Left Shoulder Right;Left    Right Shoulder Flexion 4/5    Left Shoulder Flexion 5/5    Right/Left Elbow Right;Left    Right Elbow Flexion 4/5    Right Elbow Extension 4/5    Left Elbow Flexion 5/5    Left Elbow Extension 5/5    Right/Left hand Right;Left    Right Hand Gross Grasp Impaired    Left Hand Gross Grasp Functional    Right/Left Hip Right;Left    Right Hip Flexion 4/5    Right Hip ABduction 4+/5    Left Hip Flexion 4+/5    Left Hip ABduction 4+/5    Right/Left Knee Right;Left    Right Knee Flexion 4/5    Right Knee Extension 4+/5    Left Knee Flexion 5/5    Left Knee Extension 5/5    Right/Left Ankle Right;Left    Right Ankle Dorsiflexion 4/5    Left Ankle Dorsiflexion 4+/5      Transfers   Transfers Sit to Stand;Stand to Sit    Sit to Stand 5: Supervision    Five time sit to stand comments  14.91 sec from chair with hands in lap    Stand to Sit 5: Supervision      Ambulation/Gait   Ambulation/Gait Yes    Ambulation/Gait Assistance 5: Supervision     Ambulation/Gait Assistance  Details Pt has decreased right hip extension and toe off with decreased right stance.    Ambulation Distance (Feet) 75 Feet    Assistive device Straight cane    Gait Pattern Step-through pattern;Decreased stance time - right;Decreased step length - left;Decreased hip/knee flexion - right;Decreased weight shift to right    Ambulation Surface Level;Indoor    Gait velocity 17.96 sec=0.58m/s      Standardized Balance Assessment   Standardized Balance Assessment Timed Up and Go Test      Timed Up and Go Test   TUG Normal TUG    Normal TUG (seconds) 19.87                      Objective measurements completed on examination: See above findings.               PT Education - 10/05/20 [phone]    Education Details PT plan of care    Person(s) Educated Patient;Spouse    Methods Explanation    Comprehension Verbalized understanding            PT Short Term Goals - 10/05/20 0354      PT SHORT TERM GOAL #1   Title Pt will ambulate >500' on level surfaces mod I with SPC for improved community mobility.    Time 4    Period Weeks    Status New    Target Date 11/04/20      PT SHORT TERM GOAL #2   Title Pt will decrease TUG from 19.87 sec to <16 sec for improved balance.    Baseline 10/04/20 19.87 sec    Time 4    Period Weeks    Status New    Target Date 11/04/20      PT SHORT TERM GOAL #3   Title Pt will decrease 5 x sit to stand from 14.91 sec to <12 sec for improved balance and functional strength.    Baseline 10/04/20 14.91 sec    Time 4    Period Weeks    Status New    Target Date 11/04/20      PT SHORT TERM GOAL #4   Title Berg Balance will be performed to further assess balance and LTG written.    Time 4    Period Weeks    Status New    Target Date 11/04/20             PT Long Term Goals - 10/05/20 0825      PT LONG TERM GOAL #1   Title Pt will be independent with progressive HEP for strength and balance to continue  gains on own.    Time 8    Period Weeks    Status New    Target Date 12/04/20      PT LONG TERM GOAL #2   Title Pt will increase gait speed to >0.65m/s for improved gait safety in the community.    Baseline 10/04/20 0.28m/s    Time 8    Period Weeks    Status New    Target Date 12/04/20      PT LONG TERM GOAL #3   Title Pt will ambulate >800' on varied outdoor surfaces with SPC mod I for improved community mobility.    Time 8    Period Weeks    Status New    Target Date 12/04/20      PT LONG TERM GOAL #4   Title Pt will be able to negotiate  up/down ramp and curb with [facility] supervision for improved community access.    Time 8    Period Weeks    Status New    Target Date 12/04/20      PT LONG TERM GOAL #5   Title Berg Balance goal TBD    Time 8    Period Weeks    Status New    Target Date 12/04/20                  Plan - 10/05/20 8563    Clinical Impression Statement Pt is 75 y/o male with history of CVA from 2017 with right hemiparesis. Pt presents with decreased strength. He is fall risk based on 5 x sit to stand of 14.91 sec and TUG of 19.87 sec. Currently ambulating with SPC with gait speed of 0.46m/s which indicates decreased safety with community mobility. He will benefit from skilled PT to address strength, balance and functional mobility deficits.    Personal Factors and Comorbidities Comorbidity 3+;Time since onset of injury/illness/exacerbation    Comorbidities PMH: DM2, HTN, OA, ischemic CVA March 2017    Examination-Activity Limitations Stand;Locomotion Level;Transfers;Stairs;Squat    Examination-Participation Restrictions Community Activity;Yard Work;Cleaning    Stability/Clinical Decision Making Evolving/Moderate complexity    Clinical Decision Making Moderate    Rehab Potential Good    PT Frequency 2x / week   plus eval   PT Duration 8 weeks    PT Treatment/Interventions ADLs/Self Care Home Management;DME Instruction;Gait training;Stair  training;Functional mobility training;Therapeutic activities;Therapeutic exercise;Balance training;Neuromuscular re-education;Manual techniques;Vestibular;Patient/family education    PT Next Visit Plan Assess Merrilee Jansky and write LTG. Assess ramp and curb. Begin strengthening HEP. (he is currently doing sit to stands, heel/toe raises). Needs more hip extension and toe off on right with gait.  Balance training and gait training.           Patient will benefit from skilled therapeutic intervention in order to improve the following deficits and impairments:  Abnormal gait,Decreased mobility,Decreased balance,Decreased activity tolerance,Decreased strength,Decreased knowledge of use of DME  Visit Diagnosis: Other abnormalities of gait and mobility  Muscle weakness (generalized)  Hemiplegia and hemiparesis following cerebral infarction affecting right dominant side ([place])  Unsteadiness on feet     Problem List Patient Active Problem List   Diagnosis Date Noted  . Cerebrovascular accident ([place]) 01/04/2018  . Compression fracture of thoracic vertebra (HCC) 01/04/2018  . AAA (abdominal aortic aneurysm) ([place]) 11/25/2017  . History of stroke 01/15/2017  . Hyperlipidemia LDL goal <70 04/18/2016  . Hypertension   . Hypertensive heart disease   . Chest pain 11/16/2015  . Type 2 diabetes mellitus with circulatory disorder, with long-term current use of insulin ([place]) 11/16/2015  . Essential hypertension 11/16/2015  . Thrombocytopenia ([place]) 11/16/2015  . Leukopenia 11/16/2015  . Hyponatremia 11/16/2015  . Chronic combined systolic and diastolic CHF (congestive heart failure) ([place]) 11/16/2015  . CAD in native artery   . Brainstem infarct, acute ([place]) 09/17/2015  . 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 Obesity 09/17/2015  . 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 Gait disturbance, 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  . History of CVA (cerebrovascular accident)  08/09/2015    Electa Sniff, PT, DPT, NCS 10/05/2020, 8:28 AM  [facility] [address] [place], [name], [address] Phone: [phone]   Fax:  [phone]  Name: Jose Beck MRN: [id number] Date of Birth: 1945/10/03

## 2020-10-09 DIAGNOSIS — H20012 Primary iridocyclitis, left eye: Secondary | ICD-10-CM | POA: Diagnosis not present

## 2020-10-17 ENCOUNTER — Ambulatory Visit: Payer: Medicare Other

## 2020-10-23 ENCOUNTER — Ambulatory Visit: Payer: Medicare Other | Attending: Internal Medicine | Admitting: Physical Therapy

## 2020-10-23 ENCOUNTER — Other Ambulatory Visit: Payer: Self-pay

## 2020-10-23 DIAGNOSIS — I69351 Hemiplegia and hemiparesis following cerebral infarction affecting right dominant side: Secondary | ICD-10-CM | POA: Insufficient documentation

## 2020-10-23 DIAGNOSIS — R2681 Unsteadiness on feet: Secondary | ICD-10-CM | POA: Diagnosis not present

## 2020-10-23 DIAGNOSIS — R2689 Other abnormalities of gait and mobility: Secondary | ICD-10-CM | POA: Insufficient documentation

## 2020-10-23 DIAGNOSIS — M6281 Muscle weakness (generalized): Secondary | ICD-10-CM | POA: Diagnosis not present

## 2020-10-23 NOTE — Patient Instructions (Signed)
Access Code: [email or web address] URL: [email or web address] Date: 10/23/2020 Prepared by: Willow Ora  Exercises Sit to Stand - 1 x daily - 5 x weekly - 2-3 sets - 10 reps Heel Toe Raises with Counter Support - 1 x daily - 5 x weekly - 1-2 sets - 10 reps Standing Hip Abduction with Counter Support - 1 x daily - 5 x weekly - 1-2 sets - 10 reps Standing Near Stance in [place] with Eyes Closed - 1 x daily - 5 x weekly - 1 sets - 3 reps - 30 hold

## 2020-10-23 NOTE — Therapy (Signed)
[facility] [address] [place], [name], [address] Phone: [phone]   Fax:  [phone]  Physical Therapy Treatment  Patient Details  Name: Jose Beck MRN: [id number] Date of Birth: 1945-09-02 Referring Provider (PT): [name]   Encounter Date: 10/23/2020   PT End of Session - 10/23/20 1236     Visit Number 2    Number of Visits 17    Date for PT Re-Evaluation 01/03/21   60 day poc, 90 day cert   Authorization Type medicare so 10th visit progress note    PT Start Time 1232    PT Stop Time 1313    PT Time Calculation (min) 41 min    Equipment Utilized During Treatment Gait belt    Activity Tolerance Patient tolerated treatment well;No increased pain    Behavior During Therapy WFL for tasks assessed/performed             Past Medical History:  Diagnosis Date   Abdominal aortic aneurysm (AAA) ([place])    Aortic stenosis    a. 11/2015 Echo: EF 55-60%, Gr1 DD, mild AS.   Chronic combined systolic and diastolic CHF (congestive heart failure) ([place])    a. 07/2015: TEE showing EF of 40% b. 11/2015: Echo w/ EF 55-60%, Grade 1 DD, mild AS.   Coronary artery disease    a. patient reports 100% stenosis of LAD and RCA by cath in 1999. b. 11/2015 Cath: LM nl, LAD 100 - fills by L->L collats from D1, LCX 60d, OM1 lalrge/nl, OM2 small/nl, RCA 132m, L->R collats.EF 35-45% (55-60% by echo).   Diabetes mellitus without complication ([place])    Type II   GERD (gastroesophageal reflux disease)    Hyperlipemia    Hypertension    Hypertensive heart disease    Stroke ([place])    a. 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   Thrombocytopenia ([place]) 11/2015    Past Surgical History:  Procedure Laterality Date   ABDOMINAL AORTIC ENDOVASCULAR STENT GRAFT N/A 11/25/2017   Procedure: ABDOMINAL AORTIC ENDOVASCULAR STENT GRAFT;  Surgeon: Serafina Mitchell, MD;  Location: [facility];  Service: Vascular;  Laterality: N/A;   CARDIAC  CATHETERIZATION     CARDIAC CATHETERIZATION N/A 11/16/2015   Procedure: Left Heart Cath and Coronary Angiography;  Surgeon: [name], MD;  Location: [facility];  Service: Cardiovascular;  Laterality: N/A;   MOLE REMOVAL     TONSILLECTOMY      There were no vitals filed for this visit.   Subjective Assessment - 10/23/20 1235     Subjective No falls, "the usual" pain iin his back.    Patient is accompained by: Family member   wife Jose Beck   Pertinent History PMH: DM2, HTN, OA, ischemic CVA March 2017    Patient Stated Goals Pt would like to improve his balance and walking some.    Currently in Pain? No/denies                [facility] PT Assessment - 10/23/20 1237       Standardized Balance Assessment   Standardized Balance Assessment Berg Balance Test      Berg Balance Test   Sit to Stand Able to stand without using hands and stabilize independently    Standing Unsupported Able to stand safely 2 minutes    Sitting with Back Unsupported but Feet Supported on Floor or Stool Able to sit safely and securely 2 minutes    Stand to Sit  Controls descent by using hands    Transfers Able to transfer safely, definite need of hands    Standing Unsupported with Eyes Closed Able to stand 10 seconds with supervision    Standing Unsupported with Feet Together Able to place feet together independently and stand for 1 minute with supervision    From Standing, Reach Forward with Outstretched Arm Can reach confidently >25 cm (10")    From Standing Position, Pick up Object from Floor Able to pick up shoe, needs supervision    From Standing Position, Turn to Look Behind Over each Shoulder Looks behind one side only/other side shows less weight shift   left>right side   Turn 360 Degrees Able to turn 360 degrees safely but slowly   9-10 seconds to turn   Standing Unsupported, Alternately Place Feet on Step/Stool Able to complete >2 steps/needs minimal assist    Standing Unsupported, One Foot in  Front Able to take small step independently and hold 30 seconds    Standing on One Leg Tries to lift leg/unable to hold 3 seconds but remains standing independently    Total Score 40    Berg comment: 37-45 significant risk for falls                   [facility] Adult PT Treatment/Exercise - 10/23/20 1237       Transfers   Transfers Sit to Stand;Stand to Sit    Sit to Stand 5: Supervision;With upper extremity assist;Without upper extremity assist;From bed;From chair/3-in-1    Stand to Sit 5: Supervision;With upper extremity assist;Without upper extremity assist;To bed;To chair/3-in-1      Ambulation/Gait   Ambulation/Gait Yes    Ambulation/Gait Assistance 5: Supervision    Ambulation/Gait Assistance Details around clinic with session    Assistive device Straight cane    Gait Pattern Step-through pattern;Decreased stance time - right;Decreased step length - left;Decreased hip/knee flexion - right;Decreased weight shift to right    Ambulation Surface Level;Indoor      Exercises   Exercises Other Exercises    Other Exercises  issued HEP. Refer to [place] program for full details. Min guard assist for safety with cues on form/technique needed.            issued to HEP this session:  Access Code: [email or web address] URL: [email or web address] Date: 10/23/2020 Prepared by: Willow Ora  Exercises Sit to Stand - 1 x daily - 5 x weekly - 2-3 sets - 10 reps Heel Toe Raises with Counter Support - 1 x daily - 5 x weekly - 1-2 sets - 10 reps Standing Hip Abduction with Counter Support - 1 x daily - 5 x weekly - 1-2 sets - 10 reps Standing Near Stance in [place] with Eyes Closed - 1 x daily - 5 x weekly - 1 sets - 3 reps - 30 hold       PT Education - 10/23/20 1356     Education Details results of Berg Balance Test; initial HEP for strengthening and balance.    Person(s) Educated Patient;Spouse    Methods Explanation;Demonstration;Verbal cues;Handout    Comprehension  Verbalized understanding;Returned demonstration;Verbal cues required;Need further instruction              PT Short Term Goals - 10/05/20 8250       PT SHORT TERM GOAL #1   Title Pt will ambulate >500' on level surfaces mod I with SPC for improved community mobility.    Time 4    Period Weeks  Status New    Target Date 11/04/20      PT SHORT TERM GOAL #2   Title Pt will decrease TUG from 19.87 sec to <16 sec for improved balance.    Baseline 10/04/20 19.87 sec    Time 4    Period Weeks    Status New    Target Date 11/04/20      PT SHORT TERM GOAL #3   Title Pt will decrease 5 x sit to stand from 14.91 sec to <12 sec for improved balance and functional strength.    Baseline 10/04/20 14.91 sec    Time 4    Period Weeks    Status New    Target Date 11/04/20      PT SHORT TERM GOAL #4   Title Berg Balance will be performed to further assess balance and LTG written.    Time 4    Period Weeks    Status New    Target Date 11/04/20               PT Long Term Goals - 10/05/20 0825       PT LONG TERM GOAL #1   Title Pt will be independent with progressive HEP for strength and balance to continue gains on own.    Time 8    Period Weeks    Status New    Target Date 12/04/20      PT LONG TERM GOAL #2   Title Pt will increase gait speed to >0.48m/s for improved gait safety in the community.    Baseline 10/04/20 0.41m/s    Time 8    Period Weeks    Status New    Target Date 12/04/20      PT LONG TERM GOAL #3   Title Pt will ambulate >800' on varied outdoor surfaces with SPC mod I for improved community mobility.    Time 8    Period Weeks    Status New    Target Date 12/04/20      PT LONG TERM GOAL #4   Title Pt will be able to negotiate up/down ramp and curb with [facility] supervision for improved community access.    Time 8    Period Weeks    Status New    Target Date 12/04/20      PT LONG TERM GOAL #5   Title Berg Balance goal TBD    Time 8    Period  Weeks    Status New    Target Date 12/04/20                   Plan - 10/23/20 1236     Clinical Impression Statement Today's skilled session intially focused on establishement of baseline score for Berg Balance test with pt scoring 40/56 today. Remainder of session focused on set up of an HEP for strengthening and balance. No issues noted or reported in session today.The pt is progressing toward goals and should benefit from continued PT to progress toward unmet goals.    Personal Factors and Comorbidities Comorbidity 3+;Time since onset of injury/illness/exacerbation    Comorbidities PMH: DM2, HTN, OA, ischemic CVA March 2017    Examination-Activity Limitations Stand;Locomotion Level;Transfers;Stairs;Squat    Examination-Participation Restrictions Community Activity;Yard Work;Cleaning    Stability/Clinical Decision Making Evolving/Moderate complexity    Rehab Potential Good    PT Frequency 2x / week   plus eval   PT Duration 8 weeks    PT Treatment/Interventions ADLs/Self Care  Home Management;DME Instruction;Gait training;Stair training;Functional mobility training;Therapeutic activities;Therapeutic exercise;Balance training;Neuromuscular re-education;Manual techniques;Vestibular;Patient/family education    PT Next Visit Plan . Assess ramp and curb.  Needs more hip extension and toe off on right with gait.  Balance training and gait training.    PT Home Exercise Plan Access Code: [email or web address]             Patient will benefit from skilled therapeutic intervention in order to improve the following deficits and impairments:  Abnormal gait, Decreased mobility, Decreased balance, Decreased activity tolerance, Decreased strength, Decreased knowledge of use of DME  Visit Diagnosis: Other abnormalities of gait and mobility  Muscle weakness (generalized)  Hemiplegia and hemiparesis following cerebral infarction affecting right dominant side (HCC)  Unsteadiness on  feet     Problem List Patient Active Problem List   Diagnosis Date Noted   Cerebrovascular accident ([place]) 01/04/2018   Compression fracture of thoracic vertebra ([place]) 01/04/2018   AAA (abdominal aortic aneurysm) ([place]) 11/25/2017   History of stroke 01/15/2017   Hyperlipidemia LDL goal <70 04/18/2016   Hypertension    Hypertensive heart disease    Chest pain 11/16/2015   Type 2 diabetes mellitus with circulatory disorder, with long-term current use of insulin ([place]) 11/16/2015   Essential hypertension 11/16/2015   Thrombocytopenia ([place]) 11/16/2015   Leukopenia 11/16/2015   Hyponatremia 11/16/2015   Chronic combined systolic and diastolic CHF (congestive heart failure) ([place]) 11/16/2015   CAD in native artery    Brainstem infarct, acute ([place]) 09/17/2015   Occlusion and stenosis of vertebral artery with cerebral infarction ([place]) 09/17/2015   Obesity 09/17/2015   Ataxia, 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   Gait disturbance, 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   History of CVA (cerebrovascular accident) 08/09/2015   Willow Ora, PTA, [facility] Neuro [facility] [address], [place], [place] [address] [phone] 10/24/20, 1:38 PM   Name: Keahi Mccarney MRN: [id number] Date of Birth: [birth year]

## 2020-10-25 ENCOUNTER — Other Ambulatory Visit: Payer: Self-pay

## 2020-10-25 ENCOUNTER — Encounter: Payer: Self-pay | Admitting: Physical Therapy

## 2020-10-25 ENCOUNTER — Ambulatory Visit: Payer: Medicare Other | Admitting: Physical Therapy

## 2020-10-25 DIAGNOSIS — M6281 Muscle weakness (generalized): Secondary | ICD-10-CM | POA: Diagnosis not present

## 2020-10-25 DIAGNOSIS — R2689 Other abnormalities of gait and mobility: Secondary | ICD-10-CM | POA: Diagnosis not present

## 2020-10-25 DIAGNOSIS — I69351 Hemiplegia and hemiparesis following cerebral infarction affecting right dominant side: Secondary | ICD-10-CM | POA: Diagnosis not present

## 2020-10-25 DIAGNOSIS — R2681 Unsteadiness on feet: Secondary | ICD-10-CM

## 2020-10-26 NOTE — Therapy (Signed)
[facility] [address] [place], [name], [address] Phone: [phone]   Fax:  [phone]  Physical Therapy Treatment  Patient Details  Name: Jose Beck MRN: [id number] Date of Birth: [birth year] Referring Provider (PT): [name]   Encounter Date: 10/25/2020   PT End of Session - 10/25/20 1457     Visit Number 3    Number of Visits 17    Date for PT Re-Evaluation 01/03/21   60 day poc, 90 day cert   Authorization Type medicare so 10th visit progress note    PT Start Time 5361    PT Stop Time 1530    PT Time Calculation (min) 42 min    Equipment Utilized During Treatment Gait belt    Activity Tolerance Patient tolerated treatment well;No increased pain    Behavior During Therapy WFL for tasks assessed/performed             Past Medical History:  Diagnosis Date   Abdominal aortic aneurysm (AAA) ([place])    Aortic stenosis    a. 11/2015 Echo: EF 55-60%, Gr1 DD, mild AS.   Chronic combined systolic and diastolic CHF (congestive heart failure) ([place])    a. 07/2015: TEE showing EF of 40% b. 11/2015: Echo w/ EF 55-60%, Grade 1 DD, mild AS.   Coronary artery disease    a. patient reports 100% stenosis of LAD and RCA by cath in 1999. b. 11/2015 Cath: LM nl, LAD 100 - fills by L->L collats from D1, LCX 60d, OM1 lalrge/nl, OM2 small/nl, RCA 131m, L->R collats.EF 35-45% (55-60% by echo).   Diabetes mellitus without complication ([place])    Type II   GERD (gastroesophageal reflux disease)    Hyperlipemia    Hypertension    Hypertensive heart disease    Stroke ([place])    a. 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   Thrombocytopenia ([place]) 11/2015    Past Surgical History:  Procedure Laterality Date   ABDOMINAL AORTIC ENDOVASCULAR STENT GRAFT N/A 11/25/2017   Procedure: ABDOMINAL AORTIC ENDOVASCULAR STENT GRAFT;  Surgeon: Serafina Mitchell, MD;  [facility]: [facility];  Service: Vascular;  Laterality: N/A;   CARDIAC  CATHETERIZATION     CARDIAC CATHETERIZATION N/A 11/16/2015   Procedure: Left Heart Cath and Coronary Angiography;  Surgeon: [name], MD;  Location: [facility];  Service: Cardiovascular;  Laterality: N/A;   MOLE REMOVAL     TONSILLECTOMY      There were no vitals filed for this visit.   Subjective Assessment - 10/25/20 1454     Subjective No falls or pain to report. Did the ex's- reports working on the tall posture with sit<>stands.    Patient is accompained by: Family member   wife [name]   Pertinent History PMH: DM2, HTN, OA, ischemic CVA March 2017    Patient Stated Goals Pt would like to improve his balance and walking some.    Currently in Pain? No/denies                  [facility] Adult PT Treatment/Exercise - 10/25/20 1457       Transfers   Transfers Sit to Stand;Stand to Sit    Sit to Stand 5: Supervision;With upper extremity assist;Without upper extremity assist;From bed;From chair/3-in-1    Stand to Sit 5: Supervision;With upper extremity assist;Without upper extremity assist;To bed;To chair/3-in-1      Ambulation/Gait   Ambulation/Gait Yes    Ambulation/Gait Assistance 5: Supervision  Ambulation/Gait Assistance Details around clinic with session    Assistive device Straight cane    Gait Pattern Step-through pattern;Decreased stance time - right;Decreased step length - left;Decreased hip/knee flexion - right;Decreased weight shift to right    Ambulation Surface Level;Indoor      Exercises   Exercises Other Exercises    Other Exercises  reviewed HEP issued at last session. Pt demo'd hip abduction at counter for 5 reps each side with cues on posture/form needed. able to verbally return demo of sit<>stands, heel<>toe raises and corner exercise.      Knee/Hip Exercises: Aerobic   Other Aerobic Scifit seat at 26 and arms at 8 on level 3.0 x 8 minutes with goal >/= 90 steps per minute for strengthing and activity tolernace.                 Balance  Exercises - 10/25/20 1510       Balance Exercises: Standing   Standing Eyes Closed Wide (BOA);Narrow base of support (BOS);Head turns;Foam/compliant surface;Other reps (comment);30 secs;Limitations    Standing Eyes Closed Limitations on airex: feet hip width apart- EC 30 sec x 3 reps, then feet wider apart for EC head movements left<>right, up<>down for ~10 reps each with min guard to min assist for balance.   Rockerboard Anterior/posterior;Head turns;EO;Other time (comment);Other reps (comment);Intermittent UE support;Limitations;UE support    Rockerboard Limitations on board- rocking with tall posture with decreasing UE support from bil, to single to no UE support min guard to min assist. holding board steady for EO head movements left<>right, up<>down min guard to min assist with cues on posture.    Balance Beam standing across red foam beam: alternating forward stepping to floor/back onto beam, then alternating backward stepping to floor/back onto beam for ~10 reps each with light UE support on bars, min guard assist with cues on step length and height.              PT Short Term Goals - 10/05/20 2951       PT SHORT TERM GOAL #1   Title Pt will ambulate >500' on level surfaces mod I with SPC for improved community mobility.    Time 4    Period Weeks    Status New    Target Date 11/04/20      PT SHORT TERM GOAL #2   Title Pt will decrease TUG from 19.87 sec to <16 sec for improved balance.    Baseline 10/04/20 19.87 sec    Time 4    Period Weeks    Status New    Target Date 11/04/20      PT SHORT TERM GOAL #3   Title Pt will decrease 5 x sit to stand from 14.91 sec to <12 sec for improved balance and functional strength.    Baseline 10/04/20 14.91 sec    Time 4    Period Weeks    Status New    Target Date 11/04/20      PT SHORT TERM GOAL #4   Title Berg Balance will be performed to further assess balance and LTG written.    Time 4    Period Weeks    Status New     Target Date 11/04/20               PT Long Term Goals - 10/05/20 0825       PT LONG TERM GOAL #1   Title Pt will be independent with progressive HEP for strength and  balance to continue gains on own.    Time 8    Period Weeks    Status New    Target Date 12/04/20      PT LONG TERM GOAL #2   Title Pt will increase gait speed to >0.22m/s for improved gait safety in the community.    Baseline 10/04/20 0.18m/s    Time 8    Period Weeks    Status New    Target Date 12/04/20      PT LONG TERM GOAL #3   Title Pt will ambulate >800' on varied outdoor surfaces with SPC mod I for improved community mobility.    Time 8    Period Weeks    Status New    Target Date 12/04/20      PT LONG TERM GOAL #4   Title Pt will be able to negotiate up/down ramp and curb with [facility] supervision for improved community access.    Time 8    Period Weeks    Status New    Target Date 12/04/20      PT LONG TERM GOAL #5   Title Berg Balance goal TBD    Time 8    Period Weeks    Status New    Target Date 12/04/20                10/25/20 1457  Plan  Clinical Impression Statement Today's skilled session continued to focus on balance training on compliant surfaces with no issues noted or reported in session. The pt is making steady progress toward goals and should benefit from continued PT to progress toward unmet goals.  Personal Factors and Comorbidities Comorbidity 3+;Time since onset of injury/illness/exacerbation  Comorbidities PMH: DM2, HTN, OA, ischemic CVA March 2017  Examination-Activity Limitations Stand;Locomotion Level;Transfers;Stairs;Squat  Examination-Participation Restrictions Community Activity;Yard Work;Cleaning  Pt will benefit from skilled therapeutic intervention in order to improve on the following deficits Abnormal gait;Decreased mobility;Decreased balance;Decreased activity tolerance;Decreased strength;Decreased knowledge of use of DME  Stability/Clinical Decision Making  Evolving/Moderate complexity  Rehab Potential Good  PT Frequency 2x / week (plus eval)  PT Duration 8 weeks  PT Treatment/Interventions ADLs/Self Care Home Management;DME Instruction;Gait training;Stair training;Functional mobility training;Therapeutic activities;Therapeutic exercise;Balance training;Neuromuscular re-education;Manual techniques;Vestibular;Patient/family education  PT Next Visit Plan . Assess ramp and curb.  Needs more hip extension and toe off on right with gait.  Balance training and gait training.  PT Home Exercise Plan Access Code: [email or web address]       Patient will benefit from skilled therapeutic intervention in order to improve the following deficits and impairments:  Abnormal gait, Decreased mobility, Decreased balance, Decreased activity tolerance, Decreased strength, Decreased knowledge of use of DME  Visit Diagnosis: Other abnormalities of gait and mobility  Muscle weakness (generalized)  Hemiplegia and hemiparesis following cerebral infarction affecting right dominant side (HCC)  Unsteadiness on feet     Problem List Patient Active Problem List   Diagnosis Date Noted   Cerebrovascular accident ([place]) 01/04/2018   Compression fracture of thoracic vertebra ([place]) 01/04/2018   AAA (abdominal aortic aneurysm) ([place]) 11/25/2017   History of stroke 01/15/2017   Hyperlipidemia LDL goal <70 04/18/2016   Hypertension    Hypertensive heart disease    Chest pain 11/16/2015   Type 2 diabetes mellitus with circulatory disorder, with long-term current use of insulin ([place]) 11/16/2015   Essential hypertension 11/16/2015   Thrombocytopenia ([place]) 11/16/2015   Leukopenia 11/16/2015   Hyponatremia 11/16/2015   Chronic combined systolic and diastolic CHF (congestive heart  failure) ([place]) 11/16/2015   CAD in native artery    Brainstem infarct, acute ([place]) 09/17/2015   Occlusion and stenosis of vertebral artery with cerebral infarction ([place]) 09/17/2015   Obesity 09/17/2015    Ataxia, 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   Gait disturbance, 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   History of CVA (cerebrovascular accident) 08/09/2015    Willow Ora, PTA, [facility] [address] [place], [place] [address] [phone] 10/28/20, 7:41 PM   Name: Jose Beck MRN: [id number] Date of Birth: 26-Feb-1946

## 2020-10-29 ENCOUNTER — Ambulatory Visit: Payer: Medicare Other | Admitting: Physical Therapy

## 2020-10-29 ENCOUNTER — Other Ambulatory Visit: Payer: Self-pay

## 2020-10-29 DIAGNOSIS — R2681 Unsteadiness on feet: Secondary | ICD-10-CM | POA: Diagnosis not present

## 2020-10-29 DIAGNOSIS — I69351 Hemiplegia and hemiparesis following cerebral infarction affecting right dominant side: Secondary | ICD-10-CM

## 2020-10-29 DIAGNOSIS — R2689 Other abnormalities of gait and mobility: Secondary | ICD-10-CM | POA: Diagnosis not present

## 2020-10-29 DIAGNOSIS — M6281 Muscle weakness (generalized): Secondary | ICD-10-CM | POA: Diagnosis not present

## 2020-10-30 NOTE — Therapy (Signed)
[facility] [address] [place], [name], [address] Phone: [phone]   Fax:  [phone]  Physical Therapy Treatment  Patient Details  Name: Jose Beck MRN: [id number] Date of Birth: [birth year] Referring Provider (PT): [name]   Encounter Date: 10/29/2020   PT End of Session - 10/29/20 1442     Visit Number 4    Number of Visits 17    Date for PT Re-Evaluation 01/03/21   60 day poc, 90 day cert   Authorization Type medicare so 10th visit progress note    PT Start Time 5366    PT Stop Time 1525    PT Time Calculation (min) 42 min    Equipment Utilized During Treatment Gait belt    Activity Tolerance Patient tolerated treatment well;Patient limited by fatigue    Behavior During Therapy WFL for tasks assessed/performed             Past Medical History:  Diagnosis Date   Abdominal aortic aneurysm (AAA) ([place])    Aortic stenosis    a. 11/2015 Echo: EF 55-60%, Gr1 DD, mild AS.   Chronic combined systolic and diastolic CHF (congestive heart failure) ([place])    a. 07/2015: TEE showing EF of 40% b. 11/2015: Echo w/ EF 55-60%, Grade 1 DD, mild AS.   Coronary artery disease    a. patient reports 100% stenosis of LAD and RCA by cath in 1999. b. 11/2015 Cath: LM nl, LAD 100 - fills by L->L collats from D1, LCX 60d, OM1 lalrge/nl, OM2 small/nl, RCA 176m, L->R collats.EF 35-45% (55-60% by echo).   Diabetes mellitus without complication ([place])    Type II   GERD (gastroesophageal reflux disease)    Hyperlipemia    Hypertension    Hypertensive heart disease    Stroke ([place])    a. 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   Thrombocytopenia ([place]) 11/2015    Past Surgical History:  Procedure Laterality Date   ABDOMINAL AORTIC ENDOVASCULAR STENT GRAFT N/A 11/25/2017   Procedure: ABDOMINAL AORTIC ENDOVASCULAR STENT GRAFT;  Surgeon: Serafina Mitchell, MD;  Location: [facility];  Service: Vascular;  Laterality: N/A;    CARDIAC CATHETERIZATION     CARDIAC CATHETERIZATION N/A 11/16/2015   Procedure: Left Heart Cath and Coronary Angiography;  Surgeon: [name], MD;  Location: [facility];  Service: Cardiovascular;  Laterality: N/A;   MOLE REMOVAL     TONSILLECTOMY      There were no vitals filed for this visit.   Subjective Assessment - 10/29/20 1447     Subjective Nothing new or different. Pt states that exercises are going well but it takes a while.    Patient is accompained by: Family member    Pertinent History PMH: DM2, HTN, OA, ischemic CVA March 2017    Patient Stated Goals Pt would like to improve his balance and walking some.    Currently in Pain? No/denies                [facility] PT Assessment - 10/30/20 0001       Berg Balance Test   Sit to Stand Able to stand without using hands and stabilize independently    Standing Unsupported Able to stand safely 2 minutes    Sitting with Back Unsupported but Feet Supported on Floor or Stool Able to sit safely and securely 2 minutes    Stand to Sit Controls descent by using hands    Transfers Able  to transfer safely, definite need of hands    Standing Unsupported with Eyes Closed Able to stand 10 seconds with supervision    Standing Unsupported with Feet Together Able to place feet together independently and stand for 1 minute with supervision    From Standing, Reach Forward with Outstretched Arm Can reach confidently >25 cm (10")    From Standing Position, Pick up Object from [place] to pick up shoe, needs supervision    From Standing Position, Turn to Look Behind Over each Shoulder Looks behind one side only/other side shows less weight shift    Turn 360 Degrees Able to turn 360 degrees safely but slowly    Standing Unsupported, Alternately Place Feet on Step/Stool Able to complete 4 steps without aid or supervision    Standing Unsupported, One Foot in [place] to take small step independently and hold 30 seconds    Standing on  One Leg Tries to lift leg/unable to hold 3 seconds but remains standing independently    Total Score 41    Berg comment: 37-45 significant risk for falls      Timed Up and Go Test   Normal TUG (seconds) 30                           [facility] Adult PT Treatment/Exercise - 10/30/20 0001       Transfers   Stand to Sit 5: Supervision;With upper extremity assist;Without upper extremity assist;To bed;To chair/3-in-1      Ambulation/Gait   Ambulation/Gait Assistance 5: Supervision    Ambulation/Gait Assistance Details around clinic    Assistive device Straight cane    Gait Pattern Step-through pattern;Decreased stance time - right;Decreased step length - left;Decreased hip/knee flexion - right;Decreased weight shift to right;Decreased stride length    Ambulation Surface Level;Indoor                 Balance Exercises - 10/30/20 0001       Balance Exercises: Standing   Standing Eyes Closed Narrow base of support (BOS);Head turns;Other reps (comment);Limitations;Solid surface;Wide (BOA)   x10 each head nods & head shakes in wide & then narrow base; x10 with body turns each side   Step Over Hurdles / Cones Forward step over yard stick x10 ; side stepping over yardstick x10; backwards stepping over yardstick x10   cues for big steps and increased weightshift on R   Other Standing Exercises Reach forward for cones x10; reach to the side for cones x5 each side; turn and reach for cones behind x5 each side                 PT Short Term Goals - 10/30/20 0906       PT SHORT TERM GOAL #1   Title Pt will ambulate >500' on level surfaces mod I with SPC for improved community mobility.    Baseline Ambulating in the house only 10/29/20    Time 4    Period Weeks    Status On-going    Target Date 11/04/20      PT SHORT TERM GOAL #2   Title Pt will decrease TUG from 19.87 sec to <16 sec for improved balance.    Baseline 10/04/20 19.87 sec; 10/29/20 30 sec (but this is  likely due to fatigue from testing near end of session)    Time 4    Period Weeks    Status On-going    Target Date 11/04/20  PT SHORT TERM GOAL #3   Title Pt will decrease 5 x sit to stand from 14.91 sec to <12 sec for improved balance and functional strength.    Baseline 10/04/20 14.91 sec    Time 4    Period Weeks    Status On-going    Target Date 11/04/20      PT SHORT TERM GOAL #4   Title Berg Balance will be performed to further assess balance and LTG written.    Baseline See long term goal below    Time 4    Period Weeks    Status Achieved    Target Date 11/04/20               PT Long Term Goals - 10/30/20 0908       PT LONG TERM GOAL #1   Title Pt will be independent with progressive HEP for strength and balance to continue gains on own.    Time 8    Period Weeks    Status On-going    Target Date 12/04/20      PT LONG TERM GOAL #2   Title Pt will increase gait speed to >0.46m/s for improved gait safety in the community.    Baseline 10/04/20 0.13m/s    Time 8    Period Weeks    Status On-going    Target Date 12/04/20      PT LONG TERM GOAL #3   Title Pt will ambulate >800' on varied outdoor surfaces with SPC mod I for improved community mobility.    Time 8    Period Weeks    Status On-going    Target Date 12/04/20      PT LONG TERM GOAL #4   Title Pt will be able to negotiate up/down ramp and curb with [facility] supervision for improved community access.    Time 8    Period Weeks    Status On-going    Target Date 12/04/20      PT LONG TERM GOAL #5   Title Pt will have improved Berg Balance score to at least 46/56 to demo MDIC and decreased fall risk    Baseline 41/56 on 10/29/20    Time 8    Period Weeks    Status Revised    Target Date 12/04/20                   Plan - 10/30/20 0904     Clinical Impression Statement Today's session focused on improving balance, weight shifting, and improving step length/stepping over obstacles with  gait. Checked a portion of pt's STGs -- Berg Balance score was found to be 41, TUG time increased but this may be due to pt fatigue as this was assessed at end of session. Pt also notes chronic back pain that limits him at times. Pt with difficulty getting enough R LE weight shift for safe curb negotiation and will benefit from further practice with this.    Personal Factors and Comorbidities Comorbidity 3+;Time since onset of injury/illness/exacerbation    Comorbidities PMH: DM2, HTN, OA, ischemic CVA March 2017    Examination-Activity Limitations Stand;Locomotion Level;Transfers;Stairs;Squat    Examination-Participation Restrictions Community Activity;Yard Work;Cleaning    Stability/Clinical Decision Making Evolving/Moderate complexity    Rehab Potential Good    PT Frequency 2x / week   plus eval   PT Duration 8 weeks    PT Treatment/Interventions ADLs/Self Care Home Management;DME Instruction;Gait training;Stair training;Functional mobility training;Therapeutic activities;Therapeutic exercise;Balance training;Neuromuscular re-education;Manual techniques;Vestibular;Patient/family  education    PT Next Visit Plan Assess ramp and curb.  Needs more hip extension and toe off on right with gait.  Balance training and gait training.    PT Home Exercise Plan Access Code: [email or web address]             Patient will benefit from skilled therapeutic intervention in order to improve the following deficits and impairments:  Abnormal gait, Decreased mobility, Decreased balance, Decreased activity tolerance, Decreased strength, Decreased knowledge of use of DME  Visit Diagnosis: Other abnormalities of gait and mobility  Muscle weakness (generalized)  Hemiplegia and hemiparesis following cerebral infarction affecting right dominant side (HCC)  Unsteadiness on feet     Problem List Patient Active Problem List   Diagnosis Date Noted   Cerebrovascular accident ([place]) 01/04/2018   Compression fracture of  thoracic vertebra ([place]) 01/04/2018   AAA (abdominal aortic aneurysm) ([place]) 11/25/2017   History of stroke 01/15/2017   Hyperlipidemia LDL goal <70 04/18/2016   Hypertension    Hypertensive heart disease    Chest pain 11/16/2015   Type 2 diabetes mellitus with circulatory disorder, with long-term current use of insulin ([place]) 11/16/2015   Essential hypertension 11/16/2015   Thrombocytopenia ([place]) 11/16/2015   Leukopenia 11/16/2015   Hyponatremia 11/16/2015   Chronic combined systolic and diastolic CHF (congestive heart failure) ([place]) 11/16/2015   CAD in native artery    Brainstem infarct, acute ([place]) 09/17/2015   Occlusion and stenosis of vertebral artery with cerebral infarction ([place]) 09/17/2015   Obesity 09/17/2015   Ataxia, 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   Gait disturbance, 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   History of CVA (cerebrovascular accident) 08/09/2015    [facility] April Beatrix Shipper Isabella Ida PT, DPT 10/30/2020, 9:17 AM  [facility] [address] [place], [name], [address] Phone: [phone]   Fax:  [phone]  Name: Jose Beck MRN: [id number] Date of Birth: 03-24-1946

## 2020-10-31 ENCOUNTER — Other Ambulatory Visit: Payer: Self-pay

## 2020-10-31 ENCOUNTER — Ambulatory Visit: Payer: Medicare Other | Admitting: Physical Therapy

## 2020-10-31 DIAGNOSIS — R2681 Unsteadiness on feet: Secondary | ICD-10-CM

## 2020-10-31 DIAGNOSIS — I69351 Hemiplegia and hemiparesis following cerebral infarction affecting right dominant side: Secondary | ICD-10-CM

## 2020-10-31 DIAGNOSIS — R2689 Other abnormalities of gait and mobility: Secondary | ICD-10-CM

## 2020-10-31 DIAGNOSIS — M6281 Muscle weakness (generalized): Secondary | ICD-10-CM

## 2020-11-01 NOTE — Therapy (Signed)
[facility] [address] [place], [name], [address] Phone: [phone]   Fax:  [phone]  Physical Therapy Treatment  Patient Details  Name: Jose Beck MRN: [id number] Date of Birth: 1945-09-21 Referring Provider (PT): [name]   Encounter Date: 10/31/2020   PT End of Session - 10/31/20 1438     Visit Number 5    Number of Visits 17    Date for PT Re-Evaluation 01/03/21   60 day poc, 90 day cert   Authorization Type medicare so 10th visit progress note    PT Start Time 2122    PT Stop Time 1520    PT Time Calculation (min) 42 min    Equipment Utilized During Treatment Gait belt    Activity Tolerance Patient tolerated treatment well;Patient limited by fatigue    Behavior During Therapy [facility] for tasks assessed/performed             Past Medical History:  Diagnosis Date   Abdominal aortic aneurysm (AAA) ([place])    Aortic stenosis    a. 11/2015 Echo: EF 55-60%, Gr1 DD, mild AS.   Chronic combined systolic and diastolic CHF (congestive heart failure) ([place])    a. 07/2015: TEE showing EF of 40% b. 11/2015: Echo w/ EF 55-60%, Grade 1 DD, mild AS.   Coronary artery disease    a. patient reports 100% stenosis of LAD and RCA by cath in 1999. b. 11/2015 Cath: LM nl, LAD 100 - fills by L->L collats from D1, LCX 60d, OM1 lalrge/nl, OM2 small/nl, RCA 13m, L->R collats.EF 35-45% (55-60% by echo).   Diabetes mellitus without complication ([place])    Type II   GERD (gastroesophageal reflux disease)    Hyperlipemia    Hypertension    Hypertensive heart disease    Stroke ([place])    a. 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   Thrombocytopenia ([place]) 11/2015    Past Surgical History:  Procedure Laterality Date   ABDOMINAL AORTIC ENDOVASCULAR STENT GRAFT N/A 11/25/2017   Procedure: ABDOMINAL AORTIC ENDOVASCULAR STENT GRAFT;  Surgeon: Serafina Mitchell, MD;  Location: [facility];  Service: Vascular;  Laterality: N/A;    CARDIAC CATHETERIZATION     CARDIAC CATHETERIZATION N/A 11/16/2015   Procedure: Left Heart Cath and Coronary Angiography;  Surgeon: [name], MD;  Location: [facility];  Service: Cardiovascular;  Laterality: N/A;   MOLE REMOVAL     TONSILLECTOMY      There were no vitals filed for this visit.   Subjective Assessment - 10/31/20 1443     Subjective Nothing new or different. Pt states that exercises are going well but it takes a while.    Patient is accompained by: Family member    Pertinent History PMH: DM2, HTN, OA, ischemic CVA March 2017    Patient Stated Goals Pt would like to improve his balance and walking some.                               [facility] Adult PT Treatment/Exercise - 11/01/20 0001       Ambulation/Gait   Ramp 4: Min assist    Ramp Details (indicate cue type and reason) min A/CGA for safety and cueing to increase step height    Curb 4: Min assist;3: Mod assist    Curb Details (indicate cue type and reason) worked on step up/over 4" step 3x3 to prepare for curb  using SPC   Limited due to fatigue; cues for L LE up, R LE down     Exercises   Exercises Knee/Hip      Knee/Hip Exercises: Standing   Knee Flexion Strengthening;Both;2 sets;10 reps    Knee Flexion Limitations yellow tband    Hip Flexion Stengthening;Both;2 sets;10 reps;Knee straight    Hip Flexion Limitations yellow tband    Hip Abduction Stengthening;Both;2 sets;10 reps;Knee straight    Abduction Limitations yellow tband    Hip Extension Stengthening;Both;2 sets;10 reps;Knee straight    Extension Limitations yellow tband    Forward Step Up 2 sets;10 reps;Left;Right    Other Standing Knee Exercises heel/toe x20 bilat                 Balance Exercises - 11/01/20 0001       Balance Exercises: Standing   Step Over Hurdles / Cones Forward step over yard stick 3x10 intermittent UE support; backwards stepping over yardstick 3x10; forward stepping over multiple yard  sticks to work on step through/narrower gait 3x5 intermittent UE support                 PT Short Term Goals - 10/30/20 0906       PT SHORT TERM GOAL #1   Title Pt will ambulate >500' on level surfaces mod I with SPC for improved community mobility.    Baseline Ambulating in the house only 10/29/20    Time 4    Period Weeks    Status On-going    Target Date 11/04/20      PT SHORT TERM GOAL #2   Title Pt will decrease TUG from 19.87 sec to <16 sec for improved balance.    Baseline 10/04/20 19.87 sec; 10/29/20 30 sec (but this is likely due to fatigue from testing near end of session)    Time 4    Period Weeks    Status On-going    Target Date 11/04/20      PT SHORT TERM GOAL #3   Title Pt will decrease 5 x sit to stand from 14.91 sec to <12 sec for improved balance and functional strength.    Baseline 10/04/20 14.91 sec    Time 4    Period Weeks    Status On-going    Target Date 11/04/20      PT SHORT TERM GOAL #4   Title Berg Balance will be performed to further assess balance and LTG written.    Baseline See long term goal below    Time 4    Period Weeks    Status Achieved    Target Date 11/04/20               PT Long Term Goals - 10/30/20 0908       PT LONG TERM GOAL #1   Title Pt will be independent with progressive HEP for strength and balance to continue gains on own.    Time 8    Period Weeks    Status On-going    Target Date 12/04/20      PT LONG TERM GOAL #2   Title Pt will increase gait speed to >0.65m/s for improved gait safety in the community.    Baseline 10/04/20 0.22m/s    Time 8    Period Weeks    Status On-going    Target Date 12/04/20      PT LONG TERM GOAL #3   Title Pt will ambulate >800' on varied outdoor surfaces  with [place] mod I for improved community mobility.    Time 8    Period Weeks    Status On-going    Target Date 12/04/20      PT LONG TERM GOAL #4   Title Pt will be able to negotiate up/down ramp and curb with [facility]  supervision for improved community access.    Time 8    Period Weeks    Status On-going    Target Date 12/04/20      PT LONG TERM GOAL #5   Title Pt will have improved Berg Balance score to at least 46/56 to demo MDIC and decreased fall risk    Baseline 41/56 on 10/29/20    Time 8    Period Weeks    Status Revised    Target Date 12/04/20                   Plan - 11/01/20 0915     Clinical Impression Statement Session focused on improving pt's step height and step length for safer negotiation of ramps and curbs. At this time, pt able to ascend/descend 4" step/curb but unable to perform 6" step likely due to decreased hip strength. Progressed pt's hip strengthening accordingly to try and address this. Pt able to demonstrate improved step through and narrower gait pattern given obstacles to step over; however, currently not seeing great carry over to his gait without the visual cues.    Personal Factors and Comorbidities Comorbidity 3+;Time since onset of injury/illness/exacerbation    Comorbidities PMH: DM2, HTN, OA, ischemic CVA March 2017    Examination-Activity Limitations Stand;Locomotion Level;Transfers;Stairs;Squat    Examination-Participation Restrictions Community Activity;Yard Work;Cleaning    Stability/Clinical Decision Making Evolving/Moderate complexity    Rehab Potential Good    PT Frequency 2x / week   plus eval   PT Duration 8 weeks    PT Treatment/Interventions ADLs/Self Care Home Management;DME Instruction;Gait training;Stair training;Functional mobility training;Therapeutic activities;Therapeutic exercise;Balance training;Neuromuscular re-education;Manual techniques;Vestibular;Patient/family education    PT Next Visit Plan Needs more hip extension and toe off on right with gait.  Strength, balance and gait training.    PT Home Exercise Plan Access Code: [email or web address]    Consulted and Agree with Plan of Care Patient;Family member/caregiver             Patient  will benefit from skilled therapeutic intervention in order to improve the following deficits and impairments:  Abnormal gait, Decreased mobility, Decreased balance, Decreased activity tolerance, Decreased strength, Decreased knowledge of use of DME  Visit Diagnosis: Other abnormalities of gait and mobility  Muscle weakness (generalized)  Hemiplegia and hemiparesis following cerebral infarction affecting right dominant side (HCC)  Unsteadiness on feet     Problem List Patient Active Problem List   Diagnosis Date Noted   Cerebrovascular accident ([place]) 01/04/2018   Compression fracture of thoracic vertebra ([place]) 01/04/2018   AAA (abdominal aortic aneurysm) ([place]) 11/25/2017   History of stroke 01/15/2017   Hyperlipidemia LDL goal <70 04/18/2016   Hypertension    Hypertensive heart disease    Chest pain 11/16/2015   Type 2 diabetes mellitus with circulatory disorder, with long-term current use of insulin ([place]) 11/16/2015   Essential hypertension 11/16/2015   Thrombocytopenia ([place]) 11/16/2015   Leukopenia 11/16/2015   Hyponatremia 11/16/2015   Chronic combined systolic and diastolic CHF (congestive heart failure) ([place]) 11/16/2015   CAD in native artery    Brainstem infarct, acute ([place]) 09/17/2015   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

## 2020-11-01 NOTE — Progress Notes (Signed)
Cardiology Office Note    Date:  11/05/2020   ID:  Srihari, Shellhammer 1946-02-26, MRN [id number]  PCP:  [name], MD  Cardiologist: Dr. Martinique   Chief Complaint  Patient presents with   Follow-up    6 months.   Coronary Artery Disease     History of Present Illness:    Jose Beck is a 75 y.o. male with past medical history of CAD (s/p known occlusion of LAD and RCA by cath in 1992 with cath in 2017 showing similar findings with collateral flow noted), chronic combined systolic and diastolic CHF, HTN, HLD, Type 2 DM, GERD, and prior CVA.  He had a fall with vertebral fracture in December 2018 managed conservatively. He had some increased garbled speech in March 2019 that resolved. MRI showed multiple chronic strokes but no acute changes. Prior  MRI demonstrated a 4.6 cm AAA. Seen by Dr. Trula Slade and US showed 3.6 cm AAA. CTA June showed increase size to 5.7 cm. He subsequently underwent endovascular repair on 11/25/17.  Followed yearly. Last [place] in February.  On follow up he reports he is doing ok. Denies any chest pain, dyspnea, edema, palpitations, dizziness. BP is well controlled. He is doing some balance exercises  but is pretty sedentary otherwise.   Past Medical History:  Diagnosis Date   Abdominal aortic aneurysm (AAA) ([place])    Aortic stenosis    a. 11/2015 Echo: EF 55-60%, Gr1 DD, mild AS.   Chronic combined systolic and diastolic CHF (congestive heart failure) ([place])    a. 07/2015: TEE showing EF of 40% b. 11/2015: Echo w/ EF 55-60%, Grade 1 DD, mild AS.   Coronary artery disease    a. patient reports 100% stenosis of LAD and RCA by cath in 1999. b. 11/2015 Cath: LM nl, LAD 100 - fills by L->L collats from D1, LCX 60d, OM1 lalrge/nl, OM2 small/nl, RCA 121m, L->R collats.EF 35-45% (55-60% by echo).   Diabetes mellitus without complication ([place])    Type II   GERD (gastroesophageal reflux disease)    Hyperlipemia    Hypertension    Hypertensive heart  disease    Stroke ([place])    a. 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   Thrombocytopenia ([place]) 11/2015    Past Surgical History:  Procedure Laterality Date   ABDOMINAL AORTIC ENDOVASCULAR STENT GRAFT N/A 11/25/2017   Procedure: ABDOMINAL AORTIC ENDOVASCULAR STENT GRAFT;  Surgeon: Serafina Mitchell, MD;  Location: [facility];  Service: Vascular;  Laterality: N/A;   CARDIAC CATHETERIZATION     CARDIAC CATHETERIZATION N/A 11/16/2015   Procedure: Left Heart Cath and Coronary Angiography;  Surgeon: [name], MD;  Location: [facility];  Service: Cardiovascular;  Laterality: N/A;   MOLE REMOVAL     TONSILLECTOMY      Current Medications: Outpatient Medications Prior to Visit  Medication Sig Dispense Refill   acetaminophen (TYLENOL) 650 MG CR tablet Take 650 mg by mouth every 8 (eight) hours as needed for pain.     atorvastatin (LIPITOR) 80 MG tablet Take 1 tablet (80 mg total) by mouth daily at 6 PM. 30 tablet 6   calcium-vitamin D 250-100 MG-UNIT tablet Take 1 tablet by mouth daily. Takes in morning 600mg      carvedilol (COREG) 25 MG tablet Take 25 mg by mouth 2 (two) times daily with a meal.     clopidogrel (PLAVIX) 75 MG tablet Take 1 tablet (75 mg total) by mouth daily.  30 tablet 1   insulin detemir (LEVEMIR) 100 UNIT/ML injection Inject 20 Units into the skin every morning.      losartan (COZAAR) 25 MG tablet Take 25 mg by mouth daily.     nitroGLYCERIN (NITROSTAT) 0.4 MG SL tablet Place 0.4 mg under the tongue every 5 (five) minutes as needed for chest pain (x 3 doses).     tacrolimus (PROTOPIC) 0.1 % ointment Apply 1 application topically daily as needed (facial scaling).     No facility-administered medications prior to visit.     Allergies:   Cheese and Pravastatin   Social History   Socioeconomic History   Marital status: Married    Spouse name: [name]   Number of children: 0   Years of education: College   Highest education level: Not on file  Occupational History    Occupation: Retired   Tobacco Use   Smoking status: Never   Smokeless tobacco: Never  Vaping Use   Vaping Use: Never used  Substance and Sexual Activity   Alcohol use: Yes    Alcohol/week: 1.0 standard drink    Types: 1 Shots of liquor per week    Comment: cocktails every day   Drug use: No   Sexual activity: Not on file  Other Topics Concern   Not on file  Social History Narrative   Drinks coffee daily    Social Determinants of Health   Financial Resource Strain: Not on file  Food Insecurity: Not on file  Transportation Needs: Not on file  Physical Activity: Not on file  Stress: Not on file  Social Connections: Not on file     Family History:  The patient's family history includes Heart disease in his mother; Hypertension in his mother.   Review of Systems:   Please see the history of present illness.     All other systems reviewed and are otherwise negative except as noted above.   Physical Exam:    VS:  BP 128/82 (BP Location: Left Arm, Patient Position: Sitting, Cuff Size: Large)   Pulse 68   Ht 5\' 7"  (1.702 m)   Wt 240 lb (108.9 kg)   BMI 37.59 kg/m    GENERAL:  Well appearing obese WM in NAD. Walks with cane.  HEENT:  PERRL, EOMI, sclera are clear. Oropharynx is clear. NECK:  No jugular venous distention, carotid upstroke brisk and symmetric, no bruits, no thyromegaly or adenopathy LUNGS:  Clear to auscultation bilaterally CHEST:  Unremarkable HEART:  RRR,  PMI not displaced or sustained,S1 and S2 within normal limits, no S3, no S4: soft 2/6 systolic murmur RUSB. ABD:  Soft, nontender. BS +, no masses or bruits. No hepatomegaly, no splenomegaly EXT:  2 + pulses throughout, no edema, no cyanosis no clubbing SKIN:  Warm and dry.  No rashes NEURO:  Alert and oriented x 3. Cranial nerves II through XII intact. PSYCH:  Cognitively intact       Wt Readings from Last 3 Encounters:  11/05/20 240 lb (108.9 kg)  06/19/20 241 lb 9.6 oz (109.6 kg)  05/07/20  243 lb (110.2 kg)     Studies/Labs Reviewed:   EKG:  EKG is ordered today. NSR with old inferior and anterior infarcts. Unchanged from prior. I have personally reviewed and interpreted this study.    Recent Labs: No results found for requested labs within last 8760 hours.   Lipid Panel    Component Value Date/Time   CHOL 151 11/16/2015 0702   TRIG 57 11/16/2015  0702   HDL 46 11/16/2015 0702   CHOLHDL 3.3 11/16/2015 0702   VLDL 11 11/16/2015 0702   LDLCALC 94 11/16/2015 0702   Labs dated 01/21/17: cholesterol 144, triglycerides 69, HDL 54, LDL 76. Dated 10/07/17: A1c 5.2%.  Dated 11/26/17: plts 94K. Hgb 13.4. Chemistries normal. A1c 5.7% Dated 06/29/19: A1c 5.4%. Dated 07/26/19: cholesterol 139, triglycerides 55, HDL 59, LDL 69. Creatinine 1.1. plts 94K. Otherwise CMET, CBC, TSH normal.  Dated 11/07/19: A1c 5.4% Dated 08/06/20: cholesterol 132, triglycerides 44, HDL 64, LDL 59. A1c 5.7%. creatinine 1.1. Otherwise CMET and TSH normal.  Additional studies/ records that were reviewed today include:   Cardiac Catheterization: 11/2015 Mid RCA lesion, 100% stenosed. Ost 1st Diag to 1st Diag lesion, 70% stenosed. Mid LAD lesion, 100% stenosed. Dist RCA lesion, 100% stenosed. Dist Cx lesion, 60% stenosed.   Total occlusion of the proximal RCA. RCA is heavily calcified. RCA fills by collaterals from the circumflex coronary of the left system. The right coronary is large in distribution. Total occlusion of the proximal to mid LAD after the origin of the first of perforator and first diagonal. Left to left collateral supply the LAD and a second diagonal. The first diagonal contains eccentric 50-70% narrowing. Widely patent circumflex including a small ramus intermedius/first obtuse marginal, and a large branching second obtuse marginal. The distal circumflex beyond the second marginal is small and contains 50-70% narrowing. The circumflex is the source of collaterals to the distal right  coronary. Mildly depressed LV function with estimated EF in the 35 to 40% range. LVEDP is mildly elevated. The inferior wall is hypokinetic.     RECOMMENDATIONS: Images were reviewed with Dr. Martinique. The plan at this time is to continue medical therapy unless symptoms progress. Aggressive risk factor modification. Heart failure therapy as indicated.  Echocardiogram: 11/2015 Study Conclusions   - Left ventricle: The cavity size was normal. Systolic function was   normal. The estimated ejection fraction was in the range of 55%   to 60%. Wall motion was normal; there were no regional wall   motion abnormalities. There was an increased relative   contribution of atrial contraction to ventricular filling.   Doppler parameters are consistent with abnormal left ventricular   relaxation (grade 1 diastolic dysfunction). - Aortic valve: Moderately calcified annulus. Trileaflet. Moderate   diffuse thickening and calcification. There was mild stenosis.   Valve area (VTI): 2.17 cm^2. Valve area (Vmax): 1.88 cm^2. Valve   area (Vmean): 1.75 cm^2.   Assessment:    1. Coronary artery disease involving native coronary artery of native heart without angina pectoris   2. Essential hypertension   3. Hypercholesterolemia   4. Abdominal aortic aneurysm, ruptured ([place])   5. Chronic combined systolic and diastolic CHF (congestive heart failure) ([place])       Plan:   In order of problems listed above:  1. CAD - the patient has known occlusion of the LAD and RCA by cath in 1992 with repeat cath in 2017 showing similar findings with collateral flow  - he denies any chest pain or dyspnea on exertion.  - continue Plavix, BB, and statin therapy.   2. Chronic Combined Systolic and Diastolic CHF - Echo in 22/6333 showed a EF 55-60% by echo at that time.  - no evidence of volume overload.  - continue Coreg 25mg  BID and Losartan 25 mg daily.   3. HTN - BP well controlled.  4. HLD -  Last LDL 59.   -  Remains on  high-dose Atorvastatin 80mg  daily.  5. Type 2 DM -  Followed by PCP. Last A1c 5.4%.   6. AAA -  S/p endovascular repair in July 2019. Followed by Dr Trula Slade. [place] in Feb was stable with no endoleak.  7. History of CVA.   Follow up in 6 months.    Signed, Savon Bordonaro Martinique, MD  11/05/2020 4:08 PM    [facility] [address], [place], [place] [address] Phone: [phone]

## 2020-11-05 ENCOUNTER — Other Ambulatory Visit: Payer: Self-pay

## 2020-11-05 ENCOUNTER — Encounter: Payer: Self-pay | Admitting: Cardiology

## 2020-11-05 ENCOUNTER — Ambulatory Visit (INDEPENDENT_AMBULATORY_CARE_PROVIDER_SITE_OTHER): Payer: Medicare Other | Admitting: Cardiology

## 2020-11-05 VITALS — BP 128/82 | HR 68 | Ht 67.0 in | Wt 240.0 lb

## 2020-11-05 DIAGNOSIS — I5042 Chronic combined systolic (congestive) and diastolic (congestive) heart failure: Secondary | ICD-10-CM

## 2020-11-05 DIAGNOSIS — E7800 Pure hypercholesterolemia, unspecified: Secondary | ICD-10-CM

## 2020-11-05 DIAGNOSIS — I713 Abdominal aortic aneurysm, ruptured, unspecified: Secondary | ICD-10-CM

## 2020-11-05 DIAGNOSIS — I2510 Atherosclerotic heart disease of native coronary artery without angina pectoris: Secondary | ICD-10-CM

## 2020-11-05 DIAGNOSIS — I10 Essential (primary) hypertension: Secondary | ICD-10-CM | POA: Diagnosis not present

## 2020-11-06 ENCOUNTER — Encounter: Payer: Self-pay | Admitting: Physical Therapy

## 2020-11-06 ENCOUNTER — Ambulatory Visit: Payer: Medicare Other | Admitting: Physical Therapy

## 2020-11-06 DIAGNOSIS — R2681 Unsteadiness on feet: Secondary | ICD-10-CM | POA: Diagnosis not present

## 2020-11-06 DIAGNOSIS — R2689 Other abnormalities of gait and mobility: Secondary | ICD-10-CM | POA: Diagnosis not present

## 2020-11-06 DIAGNOSIS — M6281 Muscle weakness (generalized): Secondary | ICD-10-CM

## 2020-11-06 DIAGNOSIS — I69351 Hemiplegia and hemiparesis following cerebral infarction affecting right dominant side: Secondary | ICD-10-CM

## 2020-11-08 ENCOUNTER — Other Ambulatory Visit: Payer: Self-pay

## 2020-11-08 ENCOUNTER — Ambulatory Visit: Payer: Medicare Other | Admitting: Physical Therapy

## 2020-11-08 DIAGNOSIS — R2681 Unsteadiness on feet: Secondary | ICD-10-CM

## 2020-11-08 DIAGNOSIS — R2689 Other abnormalities of gait and mobility: Secondary | ICD-10-CM | POA: Diagnosis not present

## 2020-11-08 DIAGNOSIS — I69351 Hemiplegia and hemiparesis following cerebral infarction affecting right dominant side: Secondary | ICD-10-CM | POA: Diagnosis not present

## 2020-11-08 DIAGNOSIS — M6281 Muscle weakness (generalized): Secondary | ICD-10-CM

## 2020-11-08 NOTE — Therapy (Signed)
[facility] [address] [place], [name], [address] Phone: [phone]   Fax:  [phone]  Physical Therapy Treatment  Patient Details  Name: Jose Beck MRN: [id number] Date of Birth: [birth year] Referring Provider (PT): [name]   Encounter Date: 11/08/2020   PT End of Session - 11/08/20 1406     Visit Number 7    Number of Visits 17    Date for PT Re-Evaluation 01/03/21   60 day poc, 90 day cert   Authorization Type medicare so 10th visit progress note    PT Start Time 1403    PT Stop Time 1444    PT Time Calculation (min) 41 min    Equipment Utilized During Treatment Gait belt    Activity Tolerance Patient tolerated treatment well;Patient limited by fatigue    Behavior During Therapy [facility] for tasks assessed/performed             Past Medical History:  Diagnosis Date   Abdominal aortic aneurysm (AAA) ([place])    Aortic stenosis    a. 11/2015 Echo: EF 55-60%, Gr1 DD, mild AS.   Chronic combined systolic and diastolic CHF (congestive heart failure) ([place])    a. 07/2015: TEE showing EF of 40% b. 11/2015: Echo w/ EF 55-60%, Grade 1 DD, mild AS.   Coronary artery disease    a. patient reports 100% stenosis of LAD and RCA by cath in 1999. b. 11/2015 Cath: LM nl, LAD 100 - fills by L->L collats from D1, LCX 60d, OM1 lalrge/nl, OM2 small/nl, RCA 176m L->R collats.EF 35-45% (55-60% by echo).   Diabetes mellitus without complication ([place]    Type II   GERD (gastroesophageal reflux disease)    Hyperlipemia    Hypertension    Hypertensive heart disease    Stroke ([place]    a. 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   Thrombocytopenia ([place] 11/2015    Past Surgical History:  Procedure Laterality Date   ABDOMINAL AORTIC ENDOVASCULAR STENT GRAFT N/A 11/25/2017   Procedure: ABDOMINAL AORTIC ENDOVASCULAR STENT GRAFT;  Surgeon: BSerafina [place] MD;  [facility];  Service: Vascular;  Laterality: N/A;    CARDIAC CATHETERIZATION     CARDIAC CATHETERIZATION N/A 11/16/2015   Procedure: Left Heart Cath and Coronary Angiography;  Surgeon: HBelva Crome MD;  [facility];  Service: Cardiovascular;  Laterality: N/A;   MOLE REMOVAL     TONSILLECTOMY      There were no vitals filed for this visit.   Subjective Assessment - 11/08/20 1405     Subjective No new complaints.  No falls or pain to report.    Patient is accompained by: Family member   spouse   Pertinent History PMH: DM2, HTN, OA, ischemic CVA March 2017    Patient Stated Goals Pt would like to improve his balance and walking some.    Currently in Pain? No/denies                   [facility] 140[facility]       Transfers   Transfers Sit to Stand;Stand to Sit    Sit to Stand 5: Supervision;With upper extremity assist;Without upper extremity assist;From bed;From chair/3-in-1    Stand to Sit 5: Supervision;With upper extremity assist;Without upper extremity assist;To bed;To chair/3-in-1      Ambulation/Gait   Ambulation/Gait Yes    Ambulation/Gait Assistance 5: Supervision    Ambulation/Gait Assistance Details around clinic  with session    Assistive device Straight cane    Gait Pattern Step-through pattern;Decreased stance time - right;Decreased step length - left;Decreased hip/knee flexion - right;Decreased weight shift to right;Decreased stride length    Ambulation Surface Level;Indoor      Knee/Hip Exercises: Aerobic   Other Aerobic Scifit seat at 26 and arms at 8 on level 4 x 8 minutes with goal >/= 90 steps per minute for strengthing and activity tolernace.                 Balance Exercises - 11/08/20 1425       Balance Exercises: Standing   Standing Eyes Closed Wide (BOA);Foam/compliant surface;3 reps;30 secs;Limitations    Standing Eyes Closed Limitations on airex with no UE support, feet hip width apart- EC 30 sec's x 3 reps with min guard to min assist for  balance. cues on posture and weight shifting to assist with balance.    Balance Beam standing across blue foam beam: alternating forward stepping to floor/back onto beam, then alternating backward stepping to floor/back onto beam for ~10 reps each with bil UE support on bars. constant cues for larger steps and higher steps to clear beam surface.    Tandem Gait Forward;Retro;Upper extremity support;Foam/compliant surface;4 reps;Limitations    Tandem Gait Limitations on blue faom beam for 4 laps each/each way with cues on posture, step placement on beam with light UE support on bars. Min guard assist for balance/safety.    Sidestepping Foam/compliant support;4 reps;Limitations    Sidestepping Limitations on blue foam beam with light to no UE support with no to light touch at times on bars for balance. cues on step length and height. min guard assist for safety with balance.    Other Standing Exercises use of floor ladder with flat rungs- working on reciprocal stepping with emphasis on increased step length/height with cane/other side HHA, min to mod assist for balance                 PT Short Term Goals - 11/06/20 1630       PT SHORT TERM GOAL #1   Title Pt will ambulate >500' on level surfaces mod I with SPC for improved community mobility.    Baseline 11/06/20: met in session today    Status Achieved      PT SHORT TERM GOAL #2   Title Pt will decrease TUG from 19.87 sec to <16 sec for improved balance.    Baseline 11/06/20: 20.78 sec's with cane    Status Not Met      PT SHORT TERM GOAL #3   Title Pt will decrease 5 x sit to stand from 14.91 sec to <12 sec for improved balance and functional strength.    Baseline 11/06/20: 10.66 sec's hands on knees from standard height surface    Status Achieved    Target Date 11/04/20      PT SHORT TERM GOAL #4   Title Berg Balance will be performed to further assess balance and LTG written.    Baseline See long term goal below    Status  Achieved    Target Date 11/04/20               PT Long Term Goals - 10/30/20 0908       PT LONG TERM GOAL #1   Title Pt will be independent with progressive HEP for strength and balance to continue gains on own.    Time 8  Period Weeks    Status On-going    Target Date 12/04/20      PT LONG TERM GOAL #2   Title Pt will increase gait speed to >0.76ms for improved gait safety in the community.    Baseline 10/04/20 0.558m    Time 8    Period Weeks    Status On-going    Target Date 12/04/20      PT LONG TERM GOAL #3   Title Pt will ambulate >800' on varied outdoor surfaces with SPC mod I for improved community mobility.    Time 8    Period Weeks    Status On-going    Target Date 12/04/20      PT LONG TERM GOAL #4   Title Pt will be able to negotiate up/down ramp and curb [facility] for improved community access.    Time 8    Period Weeks    Status On-going    Target Date 12/04/20      PT LONG TERM GOAL #5   Title Pt will have improved Berg Balance score to at least 46/56 to demo MDIC and decreased fall risk    Baseline 41/56 on 10/29/20    Time 8    Period Weeks    Status Revised    Target Date 12/04/20                   Plan - 11/08/20 1406     Clinical Impression Statement Today's skilled session continued to focus on strengthening, increased step length with gait and balance training with no issues noted or reported by patient. The pt is progressing toward goals and should benefit from continued PT to progress toward unmet goals.    Personal Factors and Comorbidities Comorbidity 3+;Time since onset of injury/illness/exacerbation    Comorbidities PMH: DM2, HTN, OA, ischemic CVA March 2017    Examination-Activity Limitations Stand;Locomotion Level;Transfers;Stairs;Squat    Examination-Participation Restrictions Community Activity;Yard Work;Cleaning    Stability/Clinical Decision Making Evolving/Moderate complexity    Rehab Potential Good     PT Frequency 2x / week   plus eval   PT Duration 8 weeks    PT Treatment/Interventions ADLs/Self Care Home Management;DME Instruction;Gait training;Stair training;Functional mobility training;Therapeutic activities;Therapeutic exercise;Balance training;Neuromuscular re-education;Manual techniques;Vestibular;Patient/family education    PT Next Visit Plan continue to work on strengthening and balance toward LTGs.    PT Home Exercise Plan Access Code: [email or web address]  Consulted and Agree with Plan of Care Patient;Family member/caregiver             Patient will benefit from skilled therapeutic intervention in order to improve the following deficits and impairments:  Abnormal gait, Decreased mobility, Decreased balance, Decreased activity tolerance, Decreased strength, Decreased knowledge of use of DME  Visit Diagnosis: Other abnormalities of gait and mobility  Muscle weakness (generalized)  Hemiplegia and hemiparesis following cerebral infarction affecting right dominant side (HCC)  Unsteadiness on feet     Problem List Patient Active Problem List   Diagnosis Date Noted   Cerebrovascular accident (HC[facility]08/26/2019   Compression fracture of thoracic vertebra (HC[facility]. Louis08/26/2019   AAA (abdominal aortic aneurysm) (H[facility]07/17/2019   History of stroke 01/15/2017   Hyperlipidemia LDL goal <70 04/18/2016   Hypertension    Hypertensive heart disease    Chest pain 11/16/2015   Type 2 diabetes mellitus with circulatory disorder, with long-term current use of insulin (HCBed[place]07/11/2015   Essential hypertension 11/16/2015   Thrombocytopenia (HCS[place]07/11/2015   Leukopenia 11/16/2015  Hyponatremia 11/16/2015   Chronic combined systolic and diastolic CHF (congestive heart failure) ([place]) 11/16/2015   CAD in native artery    Brainstem infarct, acute ([place]) 09/17/2015   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

## 2020-11-08 NOTE — Therapy (Signed)
[facility] [address] [place], [name], [address] Phone: [phone]   Fax:  [phone]  Physical Therapy Treatment  Patient Details  Name: Jose Beck MRN: [id number] Date of Birth: 12/09/1945 Referring Provider (PT): [name]   Encounter Date: 11/06/2020   11/06/20 1405  PT Visits / Re-Eval  Visit Number 6  Number of Visits 17  Date for PT Re-Evaluation 01/03/21 (60 day poc, 90 day cert)  Authorization  Authorization Type medicare so 10th visit progress note  PT Time Calculation  PT Start Time 1402  PT Stop Time 1445  PT Time Calculation (min) 43 min  PT - End of Session  Equipment Utilized During Treatment Gait belt  Activity Tolerance Patient tolerated treatment well;Patient limited by fatigue  Behavior During Therapy [facility] for tasks assessed/performed     Past Medical History:  Diagnosis Date   Abdominal aortic aneurysm (AAA) ([place])    Aortic stenosis    a. 11/2015 Echo: EF 55-60%, Gr1 DD, mild AS.   Chronic combined systolic and diastolic CHF (congestive heart failure) ([place])    a. 07/2015: TEE showing EF of 40% b. 11/2015: Echo w/ EF 55-60%, Grade 1 DD, mild AS.   Coronary artery disease    a. patient reports 100% stenosis of LAD and RCA by cath in 1999. b. 11/2015 Cath: LM nl, LAD 100 - fills by L->L collats from D1, LCX 60d, OM1 lalrge/nl, OM2 small/nl, RCA 135m, L->R collats.EF 35-45% (55-60% by echo).   Diabetes mellitus without complication ([place])    Type II   GERD (gastroesophageal reflux disease)    Hyperlipemia    Hypertension    Hypertensive heart disease    Stroke ([place])    a. 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   Thrombocytopenia ([place]) 11/2015    Past Surgical History:  Procedure Laterality Date   ABDOMINAL AORTIC ENDOVASCULAR STENT GRAFT N/A 11/25/2017   Procedure: ABDOMINAL AORTIC ENDOVASCULAR STENT GRAFT;  Surgeon: Serafina [place], MD;  Location: [facility];  Service:  Vascular;  Laterality: N/A;   CARDIAC CATHETERIZATION     CARDIAC CATHETERIZATION N/A 11/16/2015   Procedure: Left Heart Cath and Coronary Angiography;  Surgeon: [name], MD;  Location: [place] CV LAB;  Service: Cardiovascular;  Laterality: N/A;   MOLE REMOVAL     TONSILLECTOMY      There were no vitals filed for this visit.    11/06/20 1404  Symptoms/Limitations  Subjective No new complaints.  No falls or pain to report.  Patient is accompained by: Family member (spouse)  Pertinent History PMH: DM2, HTN, OA, ischemic CVA March 2017  Patient Stated Goals Pt would like to improve his balance and walking some.  Pain Assessment  Currently in Pain? No/denies       11/06/20 1406  Timed Up and Go Test  TUG Normal TUG  Normal TUG (seconds) 20.78 (with cane)      11/06/20 1406  Transfers  Transfers Sit to Stand;Stand to Sit  Sit to Stand 5: Supervision;With upper extremity assist;Without upper extremity assist;From bed;From chair/3-in-1  Five time sit to stand comments  10.66 sec's with hands on knees using standard height chair  Stand to Sit 5: Supervision;With upper extremity assist;Without upper extremity assist;To bed;To chair/3-in-1  Ambulation/Gait  Ambulation/Gait Yes  Ambulation/Gait Assistance 5: Supervision  Ambulation/Gait Assistance Details no issues noted with distance gait.  Ambulation Distance (Feet) 530 Feet (x1, plus around clinic with session)  Assistive device Straight cane  Gait Pattern Step-through pattern;Decreased stance time - right;Decreased step length - left;Decreased hip/knee flexion - right;Decreased weight shift to right;Decreased stride length  Ambulation Surface Level;Indoor  High Level Balance  High Level Balance Activities Side stepping;Marching forwards;Marching backwards  High Level Balance Comments on blue mat in parallel bars for 3 laps each with cues on form and technique, min guard assist for safety.     11/06/20 1417   Balance Exercises: Standing  Standing Eyes Closed Narrow base of support (BOS);Head turns;Other reps (comment);Limitations;Solid surface;Wide (BOA)  Standing Eyes Closed Limitations on airex with no UE support, hands hovering over a sturdy surface- feet together EC 30 sec's x 3 reps,, then feet apart for EC head movements left<>right, up<>down for ~10 reps each.  SLS with Vectors Upper extremity assist 1;Other reps (comment);Solid surface;Limitations  SLS with Vectors Limitations on floor with 2 foam bubbles: alternating forward, then lateral foot taps for ~10 reps each/each side.   Step Over Hurdles / Cones forward reciprocal stepping over 4 small hurdles in parallel bars with UE support for 8 laps, cues for increased hip/knee flexion and increased step length. min guard assist for balance      PT Short Term Goals - 10/30/20 0906       PT SHORT TERM GOAL #1   Title Pt will ambulate >500' on level surfaces mod I with SPC for improved community mobility.    Baseline Ambulating in the house only 10/29/20    Time 4    Period Weeks    Status On-going    Target Date 11/04/20      PT SHORT TERM GOAL #2   Title Pt will decrease TUG from 19.87 sec to <16 sec for improved balance.    Baseline 10/04/20 19.87 sec; 10/29/20 30 sec (but this is likely due to fatigue from testing near end of session)    Time 4    Period Weeks    Status On-going    Target Date 11/04/20      PT SHORT TERM GOAL #3   Title Pt will decrease 5 x sit to stand from 14.91 sec to <12 sec for improved balance and functional strength.    Baseline 10/04/20 14.91 sec    Time 4    Period Weeks    Status On-going    Target Date 11/04/20      PT SHORT TERM GOAL #4   Title Berg Balance will be performed to further assess balance and LTG written.    Baseline See long term goal below    Time 4    Period Weeks    Status Achieved    Target Date 11/04/20                        PT Long Term Goals - 10/30/20 0908        PT LONG TERM GOAL #1   Title Pt will be independent with progressive HEP for strength and balance to continue gains on own.    Time 8    Period Weeks    Status On-going    Target Date 12/04/20      PT LONG TERM GOAL #2   Title Pt will increase gait speed to >0.31m/s for improved gait safety in the community.    Baseline 10/04/20 0.28m/s    Time 8    Period Weeks    Status On-going    Target Date 12/04/20      PT LONG TERM  GOAL #3   Title Pt will ambulate >800' on varied outdoor surfaces with SPC mod I for improved community mobility.    Time 8    Period Weeks    Status On-going    Target Date 12/04/20      PT LONG TERM GOAL #4   Title Pt will be able to negotiate up/down ramp and curb with [facility] supervision for improved community access.    Time 8    Period Weeks    Status On-going    Target Date 12/04/20      PT LONG TERM GOAL #5   Title Pt will have improved Berg Balance score to at least 46/56 to demo MDIC and decreased fall risk    Baseline 41/56 on 10/29/20    Time 8    Period Weeks    Status Revised    Target Date 12/04/20              11/06/20 1405  Plan  Clinical Impression Statement Today's skilled session initially focused on progress toward STGs with pt meeting all goals except for Timed Up and Go goal in which the time increased from baseline. Remainder of session continued to address strengthening and balance with no issues noted or reported. The pt is making progress and should benefit from continued PT to progress toward unmet goals.  Personal Factors and Comorbidities Comorbidity 3+;Time since onset of injury/illness/exacerbation  Comorbidities PMH: DM2, HTN, OA, ischemic CVA March 2017  Examination-Activity Limitations Stand;Locomotion Level;Transfers;Stairs;Squat  Examination-Participation Restrictions Community Activity;Yard Work;Cleaning  Pt will benefit from skilled therapeutic intervention in order to improve on the following deficits  Abnormal gait;Decreased mobility;Decreased balance;Decreased activity tolerance;Decreased strength;Decreased knowledge of use of DME  Stability/Clinical Decision Making Evolving/Moderate complexity  Rehab Potential Good  PT Frequency 2x / week (plus eval)  PT Duration 8 weeks  PT Treatment/Interventions ADLs/Self Care Home Management;DME Instruction;Gait training;Stair training;Functional mobility training;Therapeutic activities;Therapeutic exercise;Balance training;Neuromuscular re-education;Manual techniques;Vestibular;Patient/family education  PT Next Visit Plan continue to work on strengthening and balance toward LTGs.  PT Home Exercise Plan Access Code: [email or web address]  Consulted and Agree with Plan of Care Patient;Family member/caregiver          Patient will benefit from skilled therapeutic intervention in order to improve the following deficits and impairments:  Abnormal gait, Decreased mobility, Decreased balance, Decreased activity tolerance, Decreased strength, Decreased knowledge of use of DME  Visit Diagnosis: Other abnormalities of gait and mobility  Muscle weakness (generalized)  Hemiplegia and hemiparesis following cerebral infarction affecting right dominant side (HCC)  Unsteadiness on feet     Problem List Patient Active Problem List   Diagnosis Date Noted   Cerebrovascular accident ([place]) 01/04/2018   Compression fracture of thoracic vertebra ([place]) 01/04/2018   AAA (abdominal aortic aneurysm) ([place]) 11/25/2017   History of stroke 01/15/2017   Hyperlipidemia LDL goal <70 04/18/2016   Hypertension    Hypertensive heart disease    Chest pain 11/16/2015   Type 2 diabetes mellitus with circulatory disorder, with long-term current use of insulin ([place]) 11/16/2015   Essential hypertension 11/16/2015   Thrombocytopenia ([place]) 11/16/2015   Leukopenia 11/16/2015   Hyponatremia 11/16/2015   Chronic combined systolic and diastolic CHF (congestive heart failure) ([place])  11/16/2015   CAD in native artery    Brainstem infarct, acute ([place]) 09/17/2015   Occlusion and stenosis of vertebral artery with cerebral infarction ([place]) 09/17/2015   Obesity 09/17/2015   Ataxia, 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   Gait disturbance, 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   History of CVA (  cerebrovascular accident) 08/09/2015    Willow Ora, PTA, [place] [address], [place], [place] [address] [phone] 11/08/20, 10:43 AM   Name: Jose Beck MRN: [id number] Date of Birth: 11-05-1945

## 2020-11-14 ENCOUNTER — Encounter: Payer: Self-pay | Admitting: Physical Therapy

## 2020-11-14 ENCOUNTER — Ambulatory Visit: Payer: Medicare Other | Attending: Internal Medicine | Admitting: Physical Therapy

## 2020-11-14 ENCOUNTER — Other Ambulatory Visit: Payer: Self-pay

## 2020-11-14 DIAGNOSIS — M6281 Muscle weakness (generalized): Secondary | ICD-10-CM | POA: Insufficient documentation

## 2020-11-14 DIAGNOSIS — I69351 Hemiplegia and hemiparesis following cerebral infarction affecting right dominant side: Secondary | ICD-10-CM | POA: Diagnosis not present

## 2020-11-14 DIAGNOSIS — R2689 Other abnormalities of gait and mobility: Secondary | ICD-10-CM | POA: Insufficient documentation

## 2020-11-14 DIAGNOSIS — R2681 Unsteadiness on feet: Secondary | ICD-10-CM | POA: Insufficient documentation

## 2020-11-14 NOTE — Therapy (Signed)
[facility] [address] [place], [name], [address] Phone: [phone]   Fax:  [phone]  Physical Therapy Treatment  Patient Details  Name: Jose Beck MRN: [id number] Date of Birth: 1946/01/12 Referring Provider (PT): [name]   Encounter Date: 11/14/2020   PT End of Session - 11/14/20 1441     Visit Number 8    Number of Visits 17    Date for PT Re-Evaluation 01/03/21   60 day poc, 90 day cert   Authorization Type medicare so 10th visit progress note    PT Start Time 1356    PT Stop Time 1439    PT Time Calculation (min) 43 min    Equipment Utilized During Treatment Gait belt    Activity Tolerance Patient tolerated treatment well;Patient limited by fatigue    Behavior During Therapy [facility] for tasks assessed/performed             Past Medical History:  Diagnosis Date   Abdominal aortic aneurysm (AAA) ([place])    Aortic stenosis    a. 11/2015 Echo: EF 55-60%, Gr1 DD, mild AS.   Chronic combined systolic and diastolic CHF (congestive heart failure) ([place])    a. 07/2015: TEE showing EF of 40% b. 11/2015: Echo w/ EF 55-60%, Grade 1 DD, mild AS.   Coronary artery disease    a. patient reports 100% stenosis of LAD and RCA by cath in 1999. b. 11/2015 Cath: LM nl, LAD 100 - fills by L->L collats from D1, LCX 60d, OM1 lalrge/nl, OM2 small/nl, RCA 113m L->R collats.EF 35-45% (55-60% by echo).   Diabetes mellitus without complication ([place]    Type II   GERD (gastroesophageal reflux disease)    Hyperlipemia    Hypertension    Hypertensive heart disease    Stroke ([place]    a. 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   Thrombocytopenia ([place] 11/2015    Past Surgical History:  Procedure Laterality Date   ABDOMINAL AORTIC ENDOVASCULAR STENT GRAFT N/A 11/25/2017   Procedure: ABDOMINAL AORTIC ENDOVASCULAR STENT GRAFT;  Surgeon: BSerafina Mitchell MD;  [facility];  Service: Vascular;  Laterality: N/A;    CARDIAC CATHETERIZATION     CARDIAC CATHETERIZATION N/A 11/16/2015   Procedure: Left Heart Cath and Coronary Angiography;  Surgeon: HBelva Crome MD;  [facility];  Service: Cardiovascular;  Laterality: N/A;   MOLE REMOVAL     TONSILLECTOMY      There were no vitals filed for this visit.   Subjective Assessment - 11/14/20 1359     Subjective Nothing new since he was last here. Exercises are still challenging.    Patient is accompained by: Family member   spouse   Pertinent History PMH: DM2, HTN, OA, ischemic CVA March 2017    Patient Stated Goals Pt would like to improve his balance and walking some.    Currently in Pain? No/denies                               [facility] Adult PT Treatment/Exercise - 11/14/20 1400       Ambulation/Gait   Ambulation/Gait Yes    Ambulation/Gait Assistance 5: Supervision    Ambulation/Gait Assistance Details around clinic wih cane    Assistive device Straight cane    Gait Pattern Step-through pattern;Decreased stance time - right;Decreased step length - left;Decreased hip/knee flexion - right;Decreased weight shift to right;Decreased stride  length    Ambulation Surface Level;Indoor      Knee/Hip Exercises: Aerobic   Other Aerobic Scifit arms at 8 with BLE and BUE on level 4 x 6 minutes with goal >/= 90 steps per minute for strengthing and activity tolernace.                 Balance Exercises - 11/14/20 1407       Balance Exercises: Standing   Standing Eyes Opened Limitations    Standing Eyes Opened Limitations on blue air ex, feet hip width distance 2 x 10 reps head turns, 2 x 10 reps head nods. Standing on blue air ex - x15 reps wall bumps with initial UE support and then none, cues for technique    Standing Eyes Closed Wide (BOA);Foam/compliant surface;3 reps;30 secs;Limitations    Standing Eyes Closed Limitations on air ex, intermittent UE support for balance as needed    SLS with Vectors Other reps  (comment);Solid surface;Limitations;Upper extremity assist 2    SLS with Vectors Limitations on floor with 2 cones alternating toe taps x10 reps B, plus an additional x5 reps B of forward and then cross body cone tap, incr difficulty with tapping with RLE    Rockerboard Anterior/posterior;Lateral;Limitations    Rockerboard Limitations A/P direction: weight shifting x15 reps with UE support and then none - cues for slowed and controlled and min guard/min A for balance, keeping board steady 2 x 5 reps scapular retraction, lateral direction: weight shifting x15 reps, holding board steady 2 x 20 seconds    Sidestepping Foam/compliant support;4 reps;Limitations    Sidestepping Limitations on blue balance beam down and back 2 reps, cues for incr foot clearance                 PT Short Term Goals - 11/06/20 1630       PT SHORT TERM GOAL #1   Title Pt will ambulate >500' on level surfaces mod I with SPC for improved community mobility.    Baseline 11/06/20: met in session today    Status Achieved      PT SHORT TERM GOAL #2   Title Pt will decrease TUG from 19.87 sec to <16 sec for improved balance.    Baseline 11/06/20: 20.78 sec's with cane    Status Not Met      PT SHORT TERM GOAL #3   Title Pt will decrease 5 x sit to stand from 14.91 sec to <12 sec for improved balance and functional strength.    Baseline 11/06/20: 10.66 sec's hands on knees from standard height surface    Status Achieved    Target Date 11/04/20      PT SHORT TERM GOAL #4   Title Berg Balance will be performed to further assess balance and LTG written.    Baseline See long term goal below    Status Achieved    Target Date 11/04/20               PT Long Term Goals - 10/30/20 0908       PT LONG TERM GOAL #1   Title Pt will be independent with progressive HEP for strength and balance to continue gains on own.    Time 8    Period Weeks    Status On-going    Target Date 12/04/20      PT LONG TERM GOAL #2    Title Pt will increase gait speed to >0.19ms for improved gait safety in the community.  Baseline 10/04/20 0.107ms    Time 8    Period Weeks    Status On-going    Target Date 12/04/20      PT LONG TERM GOAL #3   Title Pt will ambulate >800' on varied outdoor surfaces with SPC mod I for improved community mobility.    Time 8    Period Weeks    Status On-going    Target Date 12/04/20      PT LONG TERM GOAL #4   Title Pt will be able to negotiate up/down ramp and curb [facility] for improved community access.    Time 8    Period Weeks    Status On-going    Target Date 12/04/20      PT LONG TERM GOAL #5   Title Pt will have improved Berg Balance score to at least 46/56 to demo MDIC and decreased fall risk    Baseline 41/56 on 10/29/20    Time 8    Period Weeks    Status Revised    Target Date 12/04/20                   Plan - 11/14/20 1445     Clinical Impression Statement Today's skilled session continued to focus on balance strategies on compliant surfaces and SLS activities. Pt challenged by weight shifting activities on rockerboard and eyes closed on foam with wide BOS. Pt tolerated session well, will continue to progress towards LTGs.    Personal Factors and Comorbidities Comorbidity 3+;Time since onset of injury/illness/exacerbation    Comorbidities PMH: DM2, HTN, OA, ischemic CVA March 2017    Examination-Activity Limitations Stand;Locomotion Level;Transfers;Stairs;Squat    Examination-Participation Restrictions Community Activity;Yard Work;Cleaning    Stability/Clinical Decision Making Evolving/Moderate complexity    Rehab Potential Good    PT Frequency 2x / week   plus eval   PT Duration 8 weeks    PT Treatment/Interventions ADLs/Self Care Home Management;DME Instruction;Gait training;Stair training;Functional mobility training;Therapeutic activities;Therapeutic exercise;Balance training;Neuromuscular re-education;Manual  techniques;Vestibular;Patient/family education    PT Next Visit Plan continue to work on strengthening and balance toward LTGs.    PT Home Exercise Plan Access Code: [email or web address]   Consulted and Agree with Plan of Care Patient;Family member/caregiver             Patient will benefit from skilled therapeutic intervention in order to improve the following deficits and impairments:  Abnormal gait, Decreased mobility, Decreased balance, Decreased activity tolerance, Decreased strength, Decreased knowledge of use of DME  Visit Diagnosis: Muscle weakness (generalized)  Other abnormalities of gait and mobility  Hemiplegia and hemiparesis following cerebral infarction affecting right dominant side (HCC)  Unsteadiness on feet     Problem List Patient Active Problem List   Diagnosis Date Noted   Cerebrovascular accident ([facility] 01/04/2018   Compression fracture of thoracic vertebra ([place] 01/04/2018   AAA (abdominal aortic aneurysm) ([place] 11/25/2017   History of stroke 01/15/2017   Hyperlipidemia LDL goal <70 04/18/2016   Hypertension    Hypertensive heart disease    Chest pain 11/16/2015   Type 2 diabetes mellitus with circulatory disorder, with long-term current use of insulin ([place] 11/16/2015   Essential hypertension 11/16/2015   Thrombocytopenia ([facility] 11/16/2015   Leukopenia 11/16/2015   Hyponatremia 11/16/2015   Chronic combined systolic and diastolic CHF (congestive heart failure) ([place] 11/16/2015   CAD in native artery    Brainstem infarct, acute ([place] 09/17/2015   Occlusion and stenosis of vertebral artery with cerebral infarction ([facility]  09/17/2015   Obesity 09/17/2015   Ataxia, 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   Gait disturbance, 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   History of CVA (cerebrovascular accident) 08/09/2015    Arliss Journey, PT, DPT  11/14/2020, 2:46 PM  [facility] [address] [place], [name], [address] Phone:  [phone]   Fax:  [phone]  Name: Jose Beck MRN: [id number] Date of Birth: [birth year]

## 2020-11-20 ENCOUNTER — Other Ambulatory Visit: Payer: Self-pay

## 2020-11-20 ENCOUNTER — Encounter: Payer: Self-pay | Admitting: Physical Therapy

## 2020-11-20 ENCOUNTER — Ambulatory Visit: Payer: Medicare Other | Admitting: Physical Therapy

## 2020-11-20 DIAGNOSIS — I69351 Hemiplegia and hemiparesis following cerebral infarction affecting right dominant side: Secondary | ICD-10-CM | POA: Diagnosis not present

## 2020-11-20 DIAGNOSIS — M6281 Muscle weakness (generalized): Secondary | ICD-10-CM | POA: Diagnosis not present

## 2020-11-20 DIAGNOSIS — R2689 Other abnormalities of gait and mobility: Secondary | ICD-10-CM

## 2020-11-20 DIAGNOSIS — R2681 Unsteadiness on feet: Secondary | ICD-10-CM

## 2020-11-21 NOTE — Therapy (Signed)
[facility] [address] [place], [name], [address] Phone: [phone]   Fax:  [phone]  Physical Therapy Treatment  Patient Details  Name: Jose Beck MRN: [id number] Date of Birth: 1946/02/22 Referring Provider (PT): [name]   Encounter Date: 11/20/2020   PT End of Session - 11/20/20 1630     Visit Number 9    Number of Visits 17    Date for PT Re-Evaluation 01/03/21   60 day poc, 90 day cert   Authorization Type medicare so 10th visit progress note    PT Start Time 1638    PT Stop Time 1528    PT Time Calculation (min) 41 min    Equipment Utilized During Treatment Gait belt    Activity Tolerance Patient tolerated treatment well    Behavior During Therapy WFL for tasks assessed/performed             Past Medical History:  Diagnosis Date   Abdominal aortic aneurysm (AAA) ([place])    Aortic stenosis    a. 11/2015 Echo: EF 55-60%, Gr1 DD, mild AS.   Chronic combined systolic and diastolic CHF (congestive heart failure) ([place])    a. 07/2015: TEE showing EF of 40% b. 11/2015: Echo w/ EF 55-60%, Grade 1 DD, mild AS.   Coronary artery disease    a. patient reports 100% stenosis of LAD and RCA by cath in 1999. b. 11/2015 Cath: LM nl, LAD 100 - fills by L->L collats from D1, LCX 60d, OM1 lalrge/nl, OM2 small/nl, RCA 162m L->R collats.EF 35-45% (55-60% by echo).   Diabetes mellitus without complication ([place]    Type II   GERD (gastroesophageal reflux disease)    Hyperlipemia    Hypertension    Hypertensive heart disease    Stroke ([facility]    a. 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   Thrombocytopenia ([place] 11/2015    Past Surgical History:  Procedure Laterality Date   ABDOMINAL AORTIC ENDOVASCULAR STENT GRAFT N/A 11/25/2017   Procedure: ABDOMINAL AORTIC ENDOVASCULAR STENT GRAFT;  Surgeon: BSerafina Mitchell MD;  [facility];  Service: Vascular;  Laterality: N/A;   CARDIAC CATHETERIZATION      CARDIAC CATHETERIZATION N/A 11/16/2015   Procedure: Left Heart Cath and Coronary Angiography;  Surgeon: HBelva Crome MD;  [facility];  Service: Cardiovascular;  Laterality: N/A;   MOLE REMOVAL     TONSILLECTOMY      There were no vitals filed for this visit.   Subjective Assessment - 11/20/20 1453     Subjective Had a fall Sunday moring. His feet got 'tangled together". Was able to crawl toward rail of foot board and used it to pull himself up. Has skin abrasions on right knee. Denies any other injuries "other than pride". Started using walker in the house after that. Using the cane outside of home.    Patient is accompained by: Family member   spouse   Pertinent History PMH: DM2, HTN, OA, ischemic CVA March 2017    Patient Stated Goals Pt would like to improve his balance and walking some.                     [facility] MartinAdult PT Treatment/Exercise - 11/20/20 1458       Transfers   Transfers Sit to Stand;Stand to Sit    Sit to Stand 5: Supervision;With upper extremity assist;Without upper extremity assist;From bed;From chair/3-in-1    Stand to Sit  5: Supervision;With upper extremity assist;Without upper extremity assist;To bed;To chair/3-in-1      Ambulation/Gait   Ambulation/Gait Yes    Ambulation/Gait Assistance 5: Supervision    Ambulation/Gait Assistance Details around clinic with session    Assistive device Straight cane    Gait Pattern Step-through pattern;Decreased stance time - right;Decreased step length - left;Decreased hip/knee flexion - right;Decreased weight shift to right;Decreased stride length;Wide base of support    Ambulation Surface Level;Indoor      Self-Care   Self-Care Other Self-Care Comments    Other Self-Care Comments  discussed recent fall and use of the non emergency number to local fire departement that serves their neighborhood. Spouse reports already having this number as they use them to get their smoke/carbon monoixide  detectors checked/serviced; pt also reports having trouble with the standing hip abd/side kicks at home as the band is too hard. Discussed how pt is doing this at home. Spouse has pt put feet together and then ties the band. Discussed keeping feet hip width apart and working on small side kicks. Pt/spouse to try this and see if this improves his ability to do this at home.      Knee/Hip Exercises: Aerobic   Other Aerobic Scifit arms at 8 with BLE and BUE on level 4 x 8 minutes with goal >/= 90 steps per minute for strengthing and activity tolernace.                 Balance Exercises - 11/20/20 1523       Balance Exercises: Standing   Rockerboard Anterior/posterior;Lateral;EO;EC;30 seconds;10 reps;Intermittent UE support;Limitations    Rockerboard Limitations on balance board in both directions: rocking the board with emphasis on tall posture with EO, progressing to EC for ~15 reps each. min guard to min assist for balance with cues on posture/weight shifting to assist with balance/balance recovery; then holding the board steady for EC 30 sec's x 3 reps with min to mod assist at times due to posterior balance loss. cues on posture and weight shifting to assist with balance recovery.                 PT Short Term Goals - 11/06/20 1630       PT SHORT TERM GOAL #1   Title Pt will ambulate >500' on level surfaces mod I with SPC for improved community mobility.    Baseline 11/06/20: met in session today    Status Achieved      PT SHORT TERM GOAL #2   Title Pt will decrease TUG from 19.87 sec to <16 sec for improved balance.    Baseline 11/06/20: 20.78 sec's with cane    Status Not Met      PT SHORT TERM GOAL #3   Title Pt will decrease 5 x sit to stand from 14.91 sec to <12 sec for improved balance and functional strength.    Baseline 11/06/20: 10.66 sec's hands on knees from standard height surface    Status Achieved    Target Date 11/04/20      PT SHORT TERM GOAL #4   Title  Berg Balance will be performed to further assess balance and LTG written.    Baseline See long term goal below    Status Achieved    Target Date 11/04/20               PT Long Term Goals - 10/30/20 0908       PT LONG TERM GOAL #1  Title Pt will be independent with progressive HEP for strength and balance to continue gains on own.    Time 8    Period Weeks    Status On-going    Target Date 12/04/20      PT LONG TERM GOAL #2   Title Pt will increase gait speed to >0.52ms for improved gait safety in the community.    Baseline 10/04/20 0.586m    Time 8    Period Weeks    Status On-going    Target Date 12/04/20      PT LONG TERM GOAL #3   Title Pt will ambulate >800' on varied outdoor surfaces with SPC mod I for improved community mobility.    Time 8    Period Weeks    Status On-going    Target Date 12/04/20      PT LONG TERM GOAL #4   Title Pt will be able to negotiate up/down ramp and curb [facility] for improved community access.    Time 8    Period Weeks    Status On-going    Target Date 12/04/20      PT LONG TERM GOAL #5   Title Pt will have improved Berg Balance score to at least 46/56 to demo MDIC and decreased fall risk    Baseline 41/56 on 10/29/20    Time 8    Period Weeks    Status Revised    Target Date 12/04/20                   Plan - 11/20/20 1630     Clinical Impression Statement Today' s skilled session initially focused on fall recovery and options for if pt is unable to get himself up due to recent fall. Also addressed issued with one of the ex's that pt reports as too difficult (standing resisted side hip kicks). Pt and spouse to try not trying band too tight and see if that helps. Remainder of the session continued to focus on strengthening, activity tolerance and balance training on compliant surfaces. No issues noted or reported in session. The pt is progressing toward goals and should benefit from continued PT to progress  toward unmet goals.    Personal Factors and Comorbidities Comorbidity 3+;Time since onset of injury/illness/exacerbation    Comorbidities PMH: DM2, HTN, OA, ischemic CVA March 2017    Examination-Activity Limitations Stand;Locomotion Level;Transfers;Stairs;Squat    Examination-Participation Restrictions Community Activity;Yard Work;Cleaning    Stability/Clinical Decision Making Evolving/Moderate complexity    Rehab Potential Good    PT Frequency 2x / week   plus eval   PT Duration 8 weeks    PT Treatment/Interventions ADLs/Self Care Home Management;DME Instruction;Gait training;Stair training;Functional mobility training;Therapeutic activities;Therapeutic exercise;Balance training;Neuromuscular re-education;Manual techniques;Vestibular;Patient/family education    PT Next Visit Plan 10th visit progress note due; continue to work on strengthening and balance toward LTGs.    PT Home Exercise Plan Access Code: [email or web address]  Consulted and Agree with Plan of Care Patient;Family member/caregiver             Patient will benefit from skilled therapeutic intervention in order to improve the following deficits and impairments:  Abnormal gait, Decreased mobility, Decreased balance, Decreased activity tolerance, Decreased strength, Decreased knowledge of use of DME  Visit Diagnosis: Muscle weakness (generalized)  Other abnormalities of gait and mobility  Hemiplegia and hemiparesis following cerebral infarction affecting right dominant side (HCC)  Unsteadiness on feet     Problem List Patient Active Problem  List   Diagnosis Date Noted   Cerebrovascular accident ([place]) 01/04/2018   Compression fracture of thoracic vertebra ([place]) 01/04/2018   AAA (abdominal aortic aneurysm) ([place]) 11/25/2017   History of stroke 01/15/2017   Hyperlipidemia LDL goal <70 04/18/2016   Hypertension    Hypertensive heart disease    Chest pain 11/16/2015   Type 2 diabetes mellitus with circulatory disorder, with  long-term current use of insulin ([place]) 11/16/2015   Essential hypertension 11/16/2015   Thrombocytopenia ([place]) 11/16/2015   Leukopenia 11/16/2015   Hyponatremia 11/16/2015   Chronic combined systolic and diastolic CHF (congestive heart failure) ([place]) 11/16/2015   CAD in native artery    Brainstem infarct, acute ([place]) 09/17/2015   Occlusion and stenosis of vertebral artery with cerebral infarction ([place]) 09/17/2015   Obesity 09/17/2015   Ataxia, 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   Gait disturbance, 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   History of CVA (cerebrovascular accident) 08/09/2015   Willow Ora, PTA, [facility] [address], [place], [place] [address] [phone] 11/21/20, 8:26 PM    Name: Jose Beck MRN: [id number] Date of Birth: 1946-03-02

## 2020-11-22 ENCOUNTER — Other Ambulatory Visit: Payer: Self-pay

## 2020-11-22 ENCOUNTER — Ambulatory Visit: Payer: Medicare Other | Admitting: Physical Therapy

## 2020-11-22 DIAGNOSIS — R2681 Unsteadiness on feet: Secondary | ICD-10-CM

## 2020-11-22 DIAGNOSIS — I69351 Hemiplegia and hemiparesis following cerebral infarction affecting right dominant side: Secondary | ICD-10-CM

## 2020-11-22 DIAGNOSIS — R2689 Other abnormalities of gait and mobility: Secondary | ICD-10-CM

## 2020-11-22 DIAGNOSIS — M6281 Muscle weakness (generalized): Secondary | ICD-10-CM

## 2020-11-22 NOTE — Therapy (Signed)
[facility] [address] [place], [name], [address] Phone: [phone]   Fax:  [phone]  Physical Therapy Treatment and 10th visit PN  Patient Details  Name: Jose Beck MRN: [id number] Date of Birth: 02/28/1946 Referring Provider (PT): [name]  Progress Note Reporting Period 10/04/20 to 11/22/2020  See note below for Objective Data and Assessment of Progress/Goals.      Encounter Date: 11/22/2020   PT End of Session - 11/22/20 1421     Visit Number 10    Number of Visits 17    Date for PT Re-Evaluation 01/03/21   60 day poc, 90 day cert   Authorization Type medicare so 10th visit progress note    PT Start Time 1405    PT Stop Time 1445    PT Time Calculation (min) 40 min    Equipment Utilized During Treatment Gait belt    Activity Tolerance Patient tolerated treatment well    Behavior During Therapy WFL for tasks assessed/performed             Past Medical History:  Diagnosis Date   Abdominal aortic aneurysm (AAA) ([place])    Aortic stenosis    a. 11/2015 Echo: EF 55-60%, Gr1 DD, mild AS.   Chronic combined systolic and diastolic CHF (congestive heart failure) ([place])    a. 07/2015: TEE showing EF of 40% b. 11/2015: Echo w/ EF 55-60%, Grade 1 DD, mild AS.   Coronary artery disease    a. patient reports 100% stenosis of LAD and RCA by cath in 1999. b. 11/2015 Cath: LM nl, LAD 100 - fills by L->L collats from D1, LCX 60d, OM1 lalrge/nl, OM2 small/nl, RCA 163m L->R collats.EF 35-45% (55-60% by echo).   Diabetes mellitus without complication ([facility]    Type II   GERD (gastroesophageal reflux disease)    Hyperlipemia    Hypertension    Hypertensive heart disease    Stroke ([place]    a. 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   Thrombocytopenia ([place] [place] 11/2015    Past Surgical History:  Procedure Laterality Date   ABDOMINAL AORTIC ENDOVASCULAR STENT GRAFT N/A 11/25/2017   Procedure: ABDOMINAL  AORTIC ENDOVASCULAR STENT GRAFT;  Surgeon: BSerafina Mitchell MD;  Location: [facility];  Service: Vascular;  Laterality: N/A;   CARDIAC CATHETERIZATION     CARDIAC CATHETERIZATION N/A 11/16/2015   Procedure: Left Heart Cath and Coronary Angiography;  Surgeon: HBelva Crome MD;  [facility];  Service: Cardiovascular;  Laterality: N/A;   MOLE REMOVAL     TONSILLECTOMY      There were no vitals filed for this visit.   Subjective Assessment - 11/22/20 1409     Subjective Pt reports nothing new or different. Pt states he is feeling less sore now. Pt states that exercises have been going better now that they haven't tightened it as tight. Pt reports he's been consistent with doing exercises daily.    Patient is accompained by: Family member   spouse   Pertinent History PMH: DM2, HTN, OA, ischemic CVA March 2017    Patient Stated Goals Pt would like to improve his balance and walking some.    Currently in Pain? No/denies                [facility] - 11/22/20 0001       Ambulation/Gait   Ambulation/Gait Assistance 5: Supervision    Ambulation/Gait Assistance Details around clinic  Assistive device Straight cane    Gait Pattern Step-through pattern;Decreased stance time - right;Decreased step length - left;Decreased hip/knee flexion - right;Decreased weight shift to right;Decreased stride length;Wide base of support    Ambulation Surface Level;Indoor    Gait velocity 0.4 m/s      [employer]   Sit to Stand Able to stand without using hands and stabilize independently    Standing Unsupported Able to stand safely 2 minutes    Sitting with Back Unsupported but Feet Supported on Floor or Stool Able to sit safely and securely 2 minutes    Stand to Sit Sits safely with minimal use of hands    Transfers Able to transfer safely, definite need of hands    Standing Unsupported with Eyes Closed Able to stand 10 seconds safely    Standing Unsupported with Feet  Together Able to place feet together independently and stand for 1 minute with supervision    From Standing, Reach Forward with Outstretched Arm Can reach confidently >25 cm (10")    From Standing Position, Pick up Object from Floor Able to pick up shoe safely and easily    From Standing Position, Turn to Look Behind Over each Shoulder Looks behind one side only/other side shows less weight shift   after cueing able to look both sides with more equal weight shift   Turn 360 Degrees Able to turn 360 degrees safely but slowly    Standing Unsupported, Alternately Place Feet on Step/Stool Able to complete >2 steps/needs minimal assist    Standing Unsupported, One Foot in Front Able to plae foot ahead of the other independently and hold 30 seconds    Standing on One Leg Tries to lift leg/unable to hold 3 seconds but remains standing independently    Total Score 44    Berg comment: 44/56                           [facility] Adult PT Treatment/Exercise - 11/22/20 0001       Knee/Hip Exercises: Aerobic   Other Aerobic Scifit arms at 3 with BLE and BUE on level 4.5 x 9 minutes with goal >/= 90 steps per minute for strengthing and activity tolernace.                 Balance Exercises - 11/22/20 0001       Balance Exercises: Standing   Partial Tandem Stance 20 secs    Turning Right;Left;5 reps   no hand hold   Other Standing Exercises turning to look behind 2x10 R & L    Other Standing Exercises Comments Alternating taps on 4" step 3x10                 PT Short Term Goals - 11/06/20 1630       PT SHORT TERM GOAL #1   Title Pt will ambulate >500' on level surfaces mod I with SPC for improved community mobility.    Baseline 11/06/20: met in session today    Status Achieved      PT SHORT TERM GOAL #2   Title Pt will decrease TUG from 19.87 sec to <16 sec for improved balance.    Baseline 11/06/20: 20.78 sec's with cane    Status Not Met      PT SHORT TERM GOAL #3    Title Pt will decrease 5 x sit to stand from 14.91 sec to <12 sec for improved balance  and functional strength.    Baseline 11/06/20: 10.66 sec's hands on knees from standard height surface    Status Achieved    Target Date 11/04/20      PT SHORT TERM GOAL #4   Title Berg Balance will be performed to further assess balance and LTG written.    Baseline See long term goal below    Status Achieved    Target Date 11/04/20               PT Long Term Goals - 10/30/20 0908       PT LONG TERM GOAL #1   Title Pt will be independent with progressive HEP for strength and balance to continue gains on own.    Time 8    Period Weeks    Status On-going    Target Date 12/04/20      PT LONG TERM GOAL #2   Title Pt will increase gait speed to >0.88ms for improved gait safety in the community.    Baseline 10/04/20 0.557m    Time 8    Period Weeks    Status On-going    Target Date 12/04/20      PT LONG TERM GOAL #3   Title Pt will ambulate >800' on varied outdoor surfaces with SPC mod I for improved community mobility.    Time 8    Period Weeks    Status On-going    Target Date 12/04/20      PT LONG TERM GOAL #4   Title Pt will be able to negotiate up/down ramp and curb with [facility] for improved community access.    Time 8    Period Weeks    Status On-going    Target Date 12/04/20      PT LONG TERM GOAL #5   Title Pt will have improved Berg Balance score to at least 46/56 to demo MDIC and decreased fall risk    Baseline 41/56 on 10/29/20    Time 8    Period Weeks    Status Revised    Target Date 12/04/20                   Plan - 11/22/20 1700     Clinical Impression Statement 10th visit progress note due this session. Pt with increased Berg Balance score to 44/56. Pt is still a high fall risk but continues to demonstrate improving balance. Worked on turns and weight shifts this session for balance.    Personal Factors and Comorbidities Comorbidity 3+;Time  since onset of injury/illness/exacerbation    Comorbidities PMH: DM2, HTN, OA, ischemic CVA March 2017    Examination-Activity Limitations Stand;Locomotion Level;Transfers;Stairs;Squat    Examination-Participation Restrictions Community Activity;Yard Work;Cleaning    Stability/Clinical Decision Making Evolving/Moderate complexity    Rehab Potential Good    PT Frequency 2x / week   plus eval   PT Duration 8 weeks    PT Treatment/Interventions ADLs/Self Care Home Management;DME Instruction;Gait training;Stair training;Functional mobility training;Therapeutic activities;Therapeutic exercise;Balance training;Neuromuscular re-education;Manual techniques;Vestibular;Patient/family education    PT Next Visit Plan continue to work on strengthening and balance toward LTGs.    PT Home Exercise Plan Access Code: [email or web address]  Consulted and Agree with Plan of Care Patient;Family member/caregiver             Patient will benefit from skilled therapeutic intervention in order to improve the following deficits and impairments:  Abnormal gait, Decreased mobility, Decreased balance, Decreased activity tolerance, Decreased strength, Decreased knowledge of  use of DME  Visit Diagnosis: Muscle weakness (generalized)  Other abnormalities of gait and mobility  Hemiplegia and hemiparesis following cerebral infarction affecting right dominant side (HCC)  Unsteadiness on feet     Problem List Patient Active Problem List   Diagnosis Date Noted   Cerebrovascular accident ([place]) 01/04/2018   Compression fracture of thoracic vertebra ([place]) 01/04/2018   AAA (abdominal aortic aneurysm) ([place]) 11/25/2017   History of stroke 01/15/2017   Hyperlipidemia LDL goal <70 04/18/2016   Hypertension    Hypertensive heart disease    Chest pain 11/16/2015   Type 2 diabetes mellitus with circulatory disorder, with long-term current use of insulin ([place]) 11/16/2015   Essential hypertension 11/16/2015   Thrombocytopenia  ([place]) 11/16/2015   Leukopenia 11/16/2015   Hyponatremia 11/16/2015   Chronic combined systolic and diastolic CHF (congestive heart failure) ([place]) 11/16/2015   CAD in native artery    Brainstem infarct, acute ([place]) 09/17/2015   Occlusion and stenosis of vertebral artery with cerebral infarction ([place]) 09/17/2015   Obesity 09/17/2015   Ataxia, 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   Gait disturbance, 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   History of CVA (cerebrovascular accident) 08/09/2015    [facility] April Beatrix Shipper Kaysin Brock PT, DPT 11/22/2020, 5:07 PM  [facility] [address] [place], [name], [address] Phone: [phone]   Fax:  [phone]  Name: Jose Beck MRN: [id number] Date of Birth: November 19, 1945

## 2020-11-27 ENCOUNTER — Ambulatory Visit: Payer: Medicare Other | Admitting: Physical Therapy

## 2020-11-27 ENCOUNTER — Encounter: Payer: Self-pay | Admitting: Physical Therapy

## 2020-11-27 ENCOUNTER — Ambulatory Visit: Payer: Medicare Other

## 2020-11-27 ENCOUNTER — Other Ambulatory Visit: Payer: Self-pay

## 2020-11-27 DIAGNOSIS — M6281 Muscle weakness (generalized): Secondary | ICD-10-CM

## 2020-11-27 DIAGNOSIS — I69351 Hemiplegia and hemiparesis following cerebral infarction affecting right dominant side: Secondary | ICD-10-CM

## 2020-11-27 DIAGNOSIS — R2681 Unsteadiness on feet: Secondary | ICD-10-CM | POA: Diagnosis not present

## 2020-11-27 DIAGNOSIS — R2689 Other abnormalities of gait and mobility: Secondary | ICD-10-CM | POA: Diagnosis not present

## 2020-11-27 NOTE — Therapy (Signed)
[facility] [address] [place], [name], [address] Phone: [phone]   Fax:  [phone]  Physical Therapy Treatment  Patient Details  Name: Jose Beck MRN: [id number] Date of Birth: 02/22/1946 Referring Provider (PT): [name]   Encounter Date: 11/27/2020   PT End of Session - 11/27/20 1407     Visit Number 11    Number of Visits 17    Date for PT Re-Evaluation 01/03/21   60 day poc, 90 day cert   Authorization Type medicare so 10th visit progress note    PT Start Time 1403    PT Stop Time 1445    PT Time Calculation (min) 42 min    Equipment Utilized During Treatment Gait belt    Activity Tolerance Patient tolerated treatment well    Behavior During Therapy WFL for tasks assessed/performed             Past Medical History:  Diagnosis Date   Abdominal aortic aneurysm (AAA) ([place])    Aortic stenosis    a. 11/2015 Echo: EF 55-60%, Gr1 DD, mild AS.   Chronic combined systolic and diastolic CHF (congestive heart failure) ([place])    a. 07/2015: TEE showing EF of 40% b. 11/2015: Echo w/ EF 55-60%, Grade 1 DD, mild AS.   Coronary artery disease    a. patient reports 100% stenosis of LAD and RCA by cath in 1999. b. 11/2015 Cath: LM nl, LAD 100 - fills by L->L collats from D1, LCX 60d, OM1 lalrge/nl, OM2 small/nl, RCA 196m L->R collats.EF 35-45% (55-60% by echo).   Diabetes mellitus without complication ([facility]    Type II   GERD (gastroesophageal reflux disease)    Hyperlipemia    Hypertension    Hypertensive heart disease    Stroke ([facility]    a. 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   Thrombocytopenia ([facility] 11/2015    Past Surgical History:  Procedure Laterality Date   ABDOMINAL AORTIC ENDOVASCULAR STENT GRAFT N/A 11/25/2017   Procedure: ABDOMINAL AORTIC ENDOVASCULAR STENT GRAFT;  Surgeon: BSerafina Mitchell MD;  Location: [facility];  Service: Vascular;  Laterality: N/A;   CARDIAC CATHETERIZATION      CARDIAC CATHETERIZATION N/A 11/16/2015   Procedure: Left Heart Cath and Coronary Angiography;  Surgeon: HBelva Crome MD;  [facility];  Service: Cardiovascular;  Laterality: N/A;   MOLE REMOVAL     TONSILLECTOMY      There were no vitals filed for this visit.   Subjective Assessment - 11/27/20 1406     Subjective No new complaints. No falls or pain to report. HEP is going well.    Patient is accompained by: Family member   spouse   Pertinent History PMH: DM2, HTN, OA, ischemic CVA March 2017    Patient Stated Goals Pt would like to improve his balance and walking some.    Currently in Pain? No/denies                     [facility] - 11/27/20 1408       Transfers   Transfers Sit to Stand;Stand to Sit    Sit to Stand 5: Supervision;With upper extremity assist;Without upper extremity assist;From bed;From chair/3-in-1    Stand to Sit 5: Supervision;With upper extremity assist;Without upper extremity assist;To bed;To chair/3-in-1      Ambulation/Gait   Ambulation/Gait Yes    Ambulation/Gait Assistance 5: Supervision    Ambulation/Gait Assistance Details  around track with obstacles placed as follows- red mat along one long side<>4 cones along short side<>blue mat along long side<>3 bolsters on other short side- had pt walking over mats, stepping over bolsers and around cones wtih cane, min guard to min assist with mats and stepping over bolsers. cues for posture and cane placement. cues needed on correct sequencing with stepping over bolsters as well.    Ambulation Distance (Feet) 250 Feet   x1, plus around clinic with session   Assistive device Straight cane    Gait Pattern Step-through pattern;Decreased stance time - right;Decreased step length - left;Decreased hip/knee flexion - right;Decreased weight shift to right;Decreased stride length;Wide base of support    Ambulation Surface Level;Indoor    Curb 4: Min assist;Other (comment)    min guard   Curb Details (indicate cue type and reason) with 4 inch box in parallel bars- with single UE support on bar for stepping up onto box/down off box for 4 reps, min guard assits. then had pt transition to use of cane for several reps with min guard to min assist. cues needed for correct sequencing with use of cane.      Knee/Hip Exercises: Aerobic   Other Aerobic Scifit with bil UE/LE's on level 4.5 x 8 minutes with goal >/= 70 steps per minute for strengthening and activity tolerance,                 Balance Exercises - 11/27/20 1434       Balance Exercises: Standing   Standing Eyes Opened Narrow base of support (BOS);Head turns;Foam/compliant surface;Other reps (comment);Limitations    Standing Eyes Opened Limitations on airex with no UE support with feet together for head movements left<>right, then up<>down for ~10 reps each. min guard to min assist with cues on posture and weight shifting to assist with balance    Standing Eyes Closed Wide (BOA);Foam/compliant surface;Other reps (comment);30 secs;Limitations    Standing Eyes Closed Limitations on airex with no UE support with feet hip width apart for Ec 30 sec's x 3 reps with min to mod assist for balance. cues on posture and weight shifting to assist with balance.    Partial Tandem Stance Eyes open;Foam/compliant surface;20 secs;3 reps;Limitations    Partial Tandem Stance Limitations on airex with no UE support with EO for 3 reps each foot forward. min assist for balance. cues on posture.                 PT Short Term Goals - 11/06/20 1630       PT SHORT TERM GOAL #1   Title Pt will ambulate >500' on level surfaces mod I with SPC for improved community mobility.    Baseline 11/06/20: met in session today    Status Achieved      PT SHORT TERM GOAL #2   Title Pt will decrease TUG from 19.87 sec to <16 sec for improved balance.    Baseline 11/06/20: 20.78 sec's with cane    Status Not Met      PT SHORT TERM  GOAL #3   Title Pt will decrease 5 x sit to stand from 14.91 sec to <12 sec for improved balance and functional strength.    Baseline 11/06/20: 10.66 sec's hands on knees from standard height surface    Status Achieved    Target Date 11/04/20      PT SHORT TERM GOAL #4   Title Berg Balance will be performed to further assess balance and LTG  written.    Baseline See long term goal below    Status Achieved    Target Date 11/04/20               PT Long Term Goals - 10/30/20 0908       PT LONG TERM GOAL #1   Title Pt will be independent with progressive HEP for strength and balance to continue gains on own.    Time 8    Period Weeks    Status On-going    Target Date 12/04/20      PT LONG TERM GOAL #2   Title Pt will increase gait speed to >0.31ms for improved gait safety in the community.    Baseline 10/04/20 0.578m    Time 8    Period Weeks    Status On-going    Target Date 12/04/20      PT LONG TERM GOAL #3   Title Pt will ambulate >800' on varied outdoor surfaces with SPC mod I for improved community mobility.    Time 8    Period Weeks    Status On-going    Target Date 12/04/20      PT LONG TERM GOAL #4   Title Pt will be able to negotiate up/down ramp and curb with [facility] for improved community access.    Time 8    Period Weeks    Status On-going    Target Date 12/04/20      PT LONG TERM GOAL #5   Title Pt will have improved Berg Balance score to at least 46/56 to demo MDIC and decreased fall risk    Baseline 41/56 on 10/29/20    Time 8    Period Weeks    Status Revised    Target Date 12/04/20                   Plan - 11/27/20 1407     Clinical Impression Statement Today's skilled session continued to focus on strengthening, curb training and balance training. No issues noted or reported in session. The pt is making progress toward goals and should benefit from continued PT to progress toward unmet goals.    Personal Factors and  Comorbidities Comorbidity 3+;Time since onset of injury/illness/exacerbation    Comorbidities PMH: DM2, HTN, OA, ischemic CVA March 2017    Examination-Activity Limitations Stand;Locomotion Level;Transfers;Stairs;Squat    Examination-Participation Restrictions Community Activity;Yard Work;Cleaning    Stability/Clinical Decision Making Evolving/Moderate complexity    Rehab Potential Good    PT Frequency 2x / week   plus eval   PT Duration 8 weeks    PT Treatment/Interventions ADLs/Self Care Home Management;DME Instruction;Gait training;Stair training;Functional mobility training;Therapeutic activities;Therapeutic exercise;Balance training;Neuromuscular re-education;Manual techniques;Vestibular;Patient/family education    PT Next Visit Plan continue to work on strengthening and balance toward LTGs which are due 12/04/20    PT Home Exercise Plan Access Code: [email or web address]  Consulted and Agree with Plan of Care Patient;Family member/caregiver             Patient will benefit from skilled therapeutic intervention in order to improve the following deficits and impairments:  Abnormal gait, Decreased mobility, Decreased balance, Decreased activity tolerance, Decreased strength, Decreased knowledge of use of DME  Visit Diagnosis: Muscle weakness (generalized)  Other abnormalities of gait and mobility  Hemiplegia and hemiparesis following cerebral infarction affecting right dominant side (HCC)  Unsteadiness on feet     Problem List Patient Active Problem List   Diagnosis Date Noted  Cerebrovascular accident ([place]) 01/04/2018   Compression fracture of thoracic vertebra ([place]) 01/04/2018   AAA (abdominal aortic aneurysm) ([place]) 11/25/2017   History of stroke 01/15/2017   Hyperlipidemia LDL goal <70 04/18/2016   Hypertension    Hypertensive heart disease    Chest pain 11/16/2015   Type 2 diabetes mellitus with circulatory disorder, with long-term current use of insulin ([place]) 11/16/2015    Essential hypertension 11/16/2015   Thrombocytopenia ([place]) 11/16/2015   Leukopenia 11/16/2015   Hyponatremia 11/16/2015   Chronic combined systolic and diastolic CHF (congestive heart failure) ([place]) 11/16/2015   CAD in native artery    Brainstem infarct, acute ([place]) 09/17/2015   Occlusion and stenosis of vertebral artery with cerebral infarction ([place]) 09/17/2015   Obesity 09/17/2015   Ataxia, 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   Gait disturbance, 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   History of CVA (cerebrovascular accident) 08/09/2015    Willow Ora, PTA, [facility] [address] [place], [place] [address] [phone] 11/27/20, 6:39 PM   Name: Jose Beck MRN: [id number] Date of Birth: Jun 12, 1945

## 2020-11-29 ENCOUNTER — Ambulatory Visit: Payer: Medicare Other | Admitting: Physical Therapy

## 2020-11-29 ENCOUNTER — Other Ambulatory Visit: Payer: Self-pay

## 2020-11-29 DIAGNOSIS — R2689 Other abnormalities of gait and mobility: Secondary | ICD-10-CM

## 2020-11-29 DIAGNOSIS — R2681 Unsteadiness on feet: Secondary | ICD-10-CM | POA: Diagnosis not present

## 2020-11-29 DIAGNOSIS — I69351 Hemiplegia and hemiparesis following cerebral infarction affecting right dominant side: Secondary | ICD-10-CM

## 2020-11-29 DIAGNOSIS — M6281 Muscle weakness (generalized): Secondary | ICD-10-CM | POA: Diagnosis not present

## 2020-11-29 NOTE — Therapy (Signed)
[facility] [address] [place], [name], [address] Phone: [phone]   Fax:  [phone]  Physical Therapy Treatment  Patient Details  Name: Jose Beck MRN: [id number] Date of Birth: Feb 08, 1946 Referring Provider (PT): [name]   Encounter Date: 11/29/2020   PT End of Session - 11/29/20 1450     Visit Number 12    Number of Visits 17    Date for PT Re-Evaluation 01/03/21   60 day poc, 90 day cert   Authorization Type medicare so 10th visit progress note    PT Start Time 1405    PT Stop Time 1445    PT Time Calculation (min) 40 min    Equipment Utilized During Treatment Gait belt    Activity Tolerance Patient tolerated treatment well    Behavior During Therapy WFL for tasks assessed/performed             Past Medical History:  Diagnosis Date   Abdominal aortic aneurysm (AAA) ([place])    Aortic stenosis    a. 11/2015 Echo: EF 55-60%, Gr1 DD, mild AS.   Chronic combined systolic and diastolic CHF (congestive heart failure) ([place])    a. 07/2015: TEE showing EF of 40% b. 11/2015: Echo w/ EF 55-60%, Grade 1 DD, mild AS.   Coronary artery disease    a. patient reports 100% stenosis of LAD and RCA by cath in 1999. b. 11/2015 Cath: LM nl, LAD 100 - fills by L->L collats from D1, LCX 60d, OM1 lalrge/nl, OM2 small/nl, RCA 148m L->R collats.EF 35-45% (55-60% by echo).   Diabetes mellitus without complication ([place]    Type II   GERD (gastroesophageal reflux disease)    Hyperlipemia    Hypertension    Hypertensive heart disease    Stroke ([facility]    a. 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   Thrombocytopenia ([place] 11/2015    Past Surgical History:  Procedure Laterality Date   ABDOMINAL AORTIC ENDOVASCULAR STENT GRAFT N/A 11/25/2017   Procedure: ABDOMINAL AORTIC ENDOVASCULAR STENT GRAFT;  Surgeon: BSerafina Mitchell MD;  Location: [facility];  Service: Vascular;  Laterality: N/A;   CARDIAC CATHETERIZATION      CARDIAC CATHETERIZATION N/A 11/16/2015   Procedure: Left Heart Cath and Coronary Angiography;  Surgeon: HBelva Crome MD;  [facility];  Service: Cardiovascular;  Laterality: N/A;   MOLE REMOVAL     TONSILLECTOMY      There were no vitals filed for this visit.   Subjective Assessment - 11/29/20 1410     Subjective Pt states he's been doing all of his exercises (including exercises he was doing with OT and SLP from when he first had his stroke). Pt does not feel ready to progress to red tband yet.    Patient is accompained by: Family member    Pertinent History PMH: DM2, HTN, OA, ischemic CVA March 2017    Patient Stated Goals Pt would like to improve his balance and walking some.    Currently in Pain? No/denies                               [facility] 0001       Ambulation/Gait   Ambulation/Gait Assistance 5: Supervision    Ambulation Distance (Feet) 690 Feet   2x3 laps around gym with 20 sec rest break   Assistive device Straight cane  Gait Pattern Step-through pattern;Decreased stance time - right;Decreased step length - left;Decreased hip/knee flexion - right;Decreased weight shift to right;Decreased stride length;Wide base of support    Ambulation Surface Level;Indoor    Gait velocity 0.34 m/s    Ramp 5: Supervision    Curb 4: Min assist;Other (comment)   min guard   Curb Details (indicate cue type and reason) x4 with 6" curb    Gait Comments 2 rounds around gym with pt stepping over orange/brown tiles      Knee/Hip Exercises: Aerobic   Other Aerobic 2x3 bouts around gym with 20 sec rest break      Knee/Hip Exercises: Standing   Other Standing Knee Exercises in // bars: step up and over 4" and then 6" step x6 trips (first with L foot up on step, step down on R and then switching R foot on step, down with L)                      PT Short Term Goals - 11/06/20 1630       PT SHORT TERM  GOAL #1   Title Pt will ambulate >500' on level surfaces mod I with SPC for improved community mobility.    Baseline 11/06/20: met in session today    Status Achieved      PT SHORT TERM GOAL #2   Title Pt will decrease TUG from 19.87 sec to <16 sec for improved balance.    Baseline 11/06/20: 20.78 sec's with cane    Status Not Met      PT SHORT TERM GOAL #3   Title Pt will decrease 5 x sit to stand from 14.91 sec to <12 sec for improved balance and functional strength.    Baseline 11/06/20: 10.66 sec's hands on knees from standard height surface    Status Achieved    Target Date 11/04/20      PT SHORT TERM GOAL #4   Title Berg Balance will be performed to further assess balance and LTG written.    Baseline See long term goal below    Status Achieved    Target Date 11/04/20               PT Long Term Goals - 10/30/20 0908       PT LONG TERM GOAL #1   Title Pt will be independent with progressive HEP for strength and balance to continue gains on own.    Time 8    Period Weeks    Status On-going    Target Date 12/04/20      PT LONG TERM GOAL #2   Title Pt will increase gait speed to >0.62ms for improved gait safety in the community.    Baseline 10/04/20 0.527m    Time 8    Period Weeks    Status On-going    Target Date 12/04/20      PT LONG TERM GOAL #3   Title Pt will ambulate >800' on varied outdoor surfaces with SPC mod I for improved community mobility.    Time 8    Period Weeks    Status On-going    Target Date 12/04/20      PT LONG TERM GOAL #4   Title Pt will be able to negotiate up/down ramp and curb [facility] for improved community access.    Time 8    Period Weeks    Status On-going    Target Date 12/04/20  PT LONG TERM GOAL #5   Title Pt will have improved Berg Balance score to at least 46/56 to demo MDIC and decreased fall risk    Baseline 41/56 on 10/29/20    Time 8    Period Weeks    Status Revised    Target Date 12/04/20                    Plan - 11/29/20 1448     Clinical Impression Statement Session focused on improving endurance with walking and negotiating curbs/ramps. Pt still requires verbal cueing for safe negotiation. PT to continue to focus on progressing towards his goals.    Personal Factors and Comorbidities Comorbidity 3+;Time since onset of injury/illness/exacerbation    Comorbidities PMH: DM2, HTN, OA, ischemic CVA March 2017    Examination-Activity Limitations Stand;Locomotion Level;Transfers;Stairs;Squat    Examination-Participation Restrictions Community Activity;Yard Work;Cleaning    Stability/Clinical Decision Making Evolving/Moderate complexity    Rehab Potential Good    PT Frequency 2x / week   plus eval   PT Duration 8 weeks    PT Treatment/Interventions ADLs/Self Care Home Management;DME Instruction;Gait training;Stair training;Functional mobility training;Therapeutic activities;Therapeutic exercise;Balance training;Neuromuscular re-education;Manual techniques;Vestibular;Patient/family education    PT Next Visit Plan continue to work on strengthening and balance toward LTGs which are due 12/04/20    PT Home Exercise Plan Access Code: [email or web address]    Consulted and Agree with Plan of Care Patient;Family member/caregiver             Patient will benefit from skilled therapeutic intervention in order to improve the following deficits and impairments:  Abnormal gait, Decreased mobility, Decreased balance, Decreased activity tolerance, Decreased strength, Decreased knowledge of use of DME  Visit Diagnosis: Muscle weakness (generalized)  Other abnormalities of gait and mobility  Hemiplegia and hemiparesis following cerebral infarction affecting right dominant side (HCC)  Unsteadiness on feet     Problem List Patient Active Problem List   Diagnosis Date Noted   Cerebrovascular accident ([place]) 01/04/2018   Compression fracture of thoracic vertebra ([place]) 01/04/2018   AAA  (abdominal aortic aneurysm) ([place]) 11/25/2017   History of stroke 01/15/2017   Hyperlipidemia LDL goal <70 04/18/2016   Hypertension    Hypertensive heart disease    Chest pain 11/16/2015   Type 2 diabetes mellitus with circulatory disorder, with long-term current use of insulin ([place]) 11/16/2015   Essential hypertension 11/16/2015   Thrombocytopenia ([place]) 11/16/2015   Leukopenia 11/16/2015   Hyponatremia 11/16/2015   Chronic combined systolic and diastolic CHF (congestive heart failure) ([place]) 11/16/2015   CAD in native artery    Brainstem infarct, acute ([place]) 09/17/2015   Occlusion and stenosis of vertebral artery with cerebral infarction ([place]) 09/17/2015   Obesity 09/17/2015   Ataxia, 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   Gait disturbance, 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   History of CVA (cerebrovascular accident) 08/09/2015    [facility] April Beatrix Shipper Keyani Rigdon PT, DPT 11/29/2020, 2:51 PM  [facility] [address] [place], [name], [address] Phone: [phone]   Fax:  [phone]  Name: Jose Beck MRN: [id number] Date of Birth: 04-29-1946

## 2020-12-04 ENCOUNTER — Ambulatory Visit: Payer: Medicare Other | Admitting: Physical Therapy

## 2020-12-04 ENCOUNTER — Other Ambulatory Visit: Payer: Self-pay

## 2020-12-04 ENCOUNTER — Encounter: Payer: Self-pay | Admitting: Physical Therapy

## 2020-12-04 DIAGNOSIS — M6281 Muscle weakness (generalized): Secondary | ICD-10-CM | POA: Diagnosis not present

## 2020-12-04 DIAGNOSIS — R2681 Unsteadiness on feet: Secondary | ICD-10-CM

## 2020-12-04 DIAGNOSIS — R2689 Other abnormalities of gait and mobility: Secondary | ICD-10-CM

## 2020-12-04 DIAGNOSIS — I69351 Hemiplegia and hemiparesis following cerebral infarction affecting right dominant side: Secondary | ICD-10-CM | POA: Diagnosis not present
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
## 2020-12-06 ENCOUNTER — Other Ambulatory Visit: Payer: Self-pay

## 2020-12-06 ENCOUNTER — Ambulatory Visit: Payer: Medicare Other

## 2020-12-06 ENCOUNTER — Encounter: Payer: Self-pay | Admitting: Physical Therapy

## 2020-12-06 ENCOUNTER — Ambulatory Visit: Payer: Medicare Other | Admitting: Physical Therapy

## 2020-12-06 DIAGNOSIS — I69351 Hemiplegia and hemiparesis following cerebral infarction affecting right dominant side: Secondary | ICD-10-CM | POA: Diagnosis not present

## 2020-12-06 DIAGNOSIS — R2681 Unsteadiness on feet: Secondary | ICD-10-CM | POA: Diagnosis not present

## 2020-12-06 DIAGNOSIS — M6281 Muscle weakness (generalized): Secondary | ICD-10-CM | POA: Diagnosis not present

## 2020-12-06 DIAGNOSIS — R2689 Other abnormalities of gait and mobility: Secondary | ICD-10-CM | POA: Diagnosis not present
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
## 2020-12-12 ENCOUNTER — Ambulatory Visit: Payer: Medicare Other | Attending: Internal Medicine | Admitting: Physical Therapy

## 2020-12-12 ENCOUNTER — Other Ambulatory Visit: Payer: Self-pay

## 2020-12-12 DIAGNOSIS — M6281 Muscle weakness (generalized): Secondary | ICD-10-CM | POA: Insufficient documentation

## 2020-12-12 DIAGNOSIS — I69351 Hemiplegia and hemiparesis following cerebral infarction affecting right dominant side: Secondary | ICD-10-CM | POA: Diagnosis not present

## 2020-12-12 DIAGNOSIS — R2689 Other abnormalities of gait and mobility: Secondary | ICD-10-CM | POA: Insufficient documentation

## 2020-12-12 DIAGNOSIS — R2681 Unsteadiness on feet: Secondary | ICD-10-CM | POA: Insufficient documentation
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
## 2020-12-14 ENCOUNTER — Other Ambulatory Visit: Payer: Self-pay

## 2020-12-14 ENCOUNTER — Ambulatory Visit: Payer: Medicare Other | Admitting: Physical Therapy

## 2020-12-14 DIAGNOSIS — R2689 Other abnormalities of gait and mobility: Secondary | ICD-10-CM | POA: Diagnosis not present

## 2020-12-14 DIAGNOSIS — I69351 Hemiplegia and hemiparesis following cerebral infarction affecting right dominant side: Secondary | ICD-10-CM | POA: Diagnosis not present

## 2020-12-14 DIAGNOSIS — M6281 Muscle weakness (generalized): Secondary | ICD-10-CM | POA: Diagnosis not present

## 2020-12-14 DIAGNOSIS — R2681 Unsteadiness on feet: Secondary | ICD-10-CM
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
## 2020-12-17 ENCOUNTER — Ambulatory Visit: Payer: Medicare Other | Admitting: Physical Therapy

## 2020-12-17 ENCOUNTER — Other Ambulatory Visit: Payer: Self-pay

## 2020-12-17 DIAGNOSIS — M6281 Muscle weakness (generalized): Secondary | ICD-10-CM | POA: Diagnosis not present

## 2020-12-17 DIAGNOSIS — R2689 Other abnormalities of gait and mobility: Secondary | ICD-10-CM | POA: Diagnosis not present

## 2020-12-17 DIAGNOSIS — I69351 Hemiplegia and hemiparesis following cerebral infarction affecting right dominant side: Secondary | ICD-10-CM

## 2020-12-17 DIAGNOSIS — R2681 Unsteadiness on feet: Secondary | ICD-10-CM

## 2020-12-17 NOTE — Therapy (Signed)
[facility] [address] [place], [name], [address] Phone: [phone]   Fax:  [phone]  Physical Therapy Treatment  Patient Details  Name: Jose Beck MRN: [id number] Date of Birth: Sep 18, 1945 Referring Provider (PT): [name]   Encounter Date: 12/17/2020   PT End of Session - 12/17/20 1533     Visit Number 17    Date for PT Re-Evaluation 01/03/21   60 day poc, 90 day cert   Authorization Type medicare so 10th visit progress note    Progress Note Due on Visit 20    PT Start Time 1534    PT Stop Time 1615    PT Time Calculation (min) 41 min    Equipment Utilized During Treatment Gait belt    Activity Tolerance Patient tolerated treatment well    Behavior During Therapy WFL for tasks assessed/performed             Past Medical History:  Diagnosis Date   Abdominal aortic aneurysm (AAA) ([place])    Aortic stenosis    a. 11/2015 Echo: EF 55-60%, Gr1 DD, mild AS.   Chronic combined systolic and diastolic CHF (congestive heart failure) ([place])    a. 07/2015: TEE showing EF of 40% b. 11/2015: Echo w/ EF 55-60%, Grade 1 DD, mild AS.   Coronary artery disease    a. patient reports 100% stenosis of LAD and RCA by cath in 1999. b. 11/2015 Cath: LM nl, LAD 100 - fills by L->L collats from D1, LCX 60d, OM1 lalrge/nl, OM2 small/nl, RCA 165m L->R collats.EF 35-45% (55-60% by echo).   Diabetes mellitus without complication ([place]    Type II   GERD (gastroesophageal reflux disease)    Hyperlipemia    Hypertension    Hypertensive heart disease    Stroke ([place]    a. 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   Thrombocytopenia ([place] 11/2015    Past Surgical History:  Procedure Laterality Date   ABDOMINAL AORTIC ENDOVASCULAR STENT GRAFT N/A 11/25/2017   Procedure: ABDOMINAL AORTIC ENDOVASCULAR STENT GRAFT;  Surgeon: BSerafina Mitchell MD;  [facility];  Service: Vascular;  Laterality: N/A;   CARDIAC  CATHETERIZATION     CARDIAC CATHETERIZATION N/A 11/16/2015   Procedure: Left Heart Cath and Coronary Angiography;  Surgeon: HBelva Crome MD;  [facility];  Service: Cardiovascular;  Laterality: N/A;   MOLE REMOVAL     TONSILLECTOMY      There were no vitals filed for this visit.   Subjective Assessment - 12/17/20 1541     Subjective Pt reports feeling fine after last session and doing well this weekend. No falls. Has not yet tried the exercises with the red band (he takes a break on the weekends).    Patient is accompained by: Family member   spouse   Pertinent History PMH: DM2, HTN, OA, ischemic CVA March 2017    Patient Stated Goals Pt would like to improve his balance and walking some.                               [facility]ult PT Treatment/Exercise - 12/17/20 0001       Ambulation/Gait   Ambulation/Gait Assistance Details around clinic    Assistive device Straight cane    Gait Pattern Step-through pattern;Decreased stance time - right;Decreased step length - left;Decreased hip/knee flexion - right;Decreased weight shift to right;Decreased stride length;Wide base  of support    Ambulation Surface Level;Indoor    Gait Comments Forward stepping over small orange hurdles x4 roundtrip laps next to // bars (UE assist as needed, pt using cane); side stepping over small orange hurdles next to // bars x 4 roundtrips (UE assist as needed, using SPC)      Knee/Hip Exercises: Aerobic   Recumbent Bike Scifit level 5 to 7 with UE/LE's x 10 minutes with goal >/= 80 steps per minute for strengthening and activity tolerance; interval training initiated -- pt to increase to >/=100 steps/min every 2 min for 30-60 sec intervals                 Balance Exercises - 12/17/20 0001       Balance Exercises: Standing   SLS with Vectors Solid surface;Intermittent upper extremity assist   feet together, pt slide foot forward/back 3x10 each foot   Standing, One  Foot on a Step Eyes open;3 reps;10 secs    Other Standing Exercises Foot tap on to step x10 each leg                 PT Short Term Goals - 11/06/20 1630       PT SHORT TERM GOAL #1   Title Pt will ambulate >500' on level surfaces mod I with SPC for improved community mobility.    Baseline 11/06/20: met in session today    Status Achieved      PT SHORT TERM GOAL #2   Title Pt will decrease TUG from 19.87 sec to <16 sec for improved balance.    Baseline 11/06/20: 20.78 sec's with cane    Status Not Met      PT SHORT TERM GOAL #3   Title Pt will decrease 5 x sit to stand from 14.91 sec to <12 sec for improved balance and functional strength.    Baseline 11/06/20: 10.66 sec's hands on knees from standard height surface    Status Achieved    Target Date 11/04/20      PT SHORT TERM GOAL #4   Title Berg Balance will be performed to further assess balance and LTG written.    Baseline See long term goal below    Status Achieved    Target Date 11/04/20               PT Long Term Goals - 12/12/20 1453       PT LONG TERM GOAL #1   Title Pt will be independent with progressive HEP for strength and balance to continue gains on own.    Baseline 12/04/20: met with current program, will benefit from updating as pt progresses    Status Achieved      PT LONG TERM GOAL #2   Title Pt will increase gait speed to >0.45ms for improved gait safety in the community.    Baseline 12/04/20: 0.59 m/s with cane, improved just not to goal level    Status Partially Met      PT LONG TERM GOAL #3   Title Pt will ambulate >800' on varied outdoor surfaces with SPC mod I for improved community mobility.    Baseline 12/04/20: unable to assess today due to storms/rain/, then power outage in clinic; 12/12/20: only tolerated ~442' and 6 min of walking    Status Revised    Target Date 01/03/21      PT LONG TERM GOAL #4   Title Pt will be able to negotiate up/down ramp and curb  with [facility] supervision for  improved community access.    Baseline 12/04/20: unable to assess due to safety concerns with power outage and use of flashlights only in clinic; 12/12/20: requires min/CGA for curbs using cane    Status Revised    Target Date 01/03/21      PT LONG TERM GOAL #5   Title Pt will have improved Berg Balance score to at least 46/56 to demo MDIC and decreased fall risk    Baseline 12/04/20: 47/56 scored today    Status Achieved                   Plan - 12/17/20 1542     Clinical Impression Statement Continued work on endurance, balance, and dynamic gait.. Continued with interval training on sci-fit; able to tolerate increased resistance from level 5 to level 7 intermittently. Worked on gait with obstacle negotiation -- pt with difficulty maintaining long enough weight shifts to step over hurdles without UE assist.    Personal Factors and Comorbidities Comorbidity 3+;Time since onset of injury/illness/exacerbation    Comorbidities PMH: DM2, HTN, OA, ischemic CVA March 2017    Examination-Activity Limitations Stand;Locomotion Level;Transfers;Stairs;Squat    Examination-Participation Restrictions Community Activity;Yard Work;Cleaning    Stability/Clinical Decision Making Evolving/Moderate complexity    Rehab Potential Good    PT Frequency 2x / week   plus eval   PT Duration 8 weeks    PT Treatment/Interventions ADLs/Self Care Home Management;DME Instruction;Gait training;Stair training;Functional mobility training;Therapeutic activities;Therapeutic exercise;Balance training;Neuromuscular re-education;Manual techniques;Vestibular;Patient/family education    PT Next Visit Plan continue to work on strengthening and balance, ramp, obstacle negotiation, and curbs with cane, increasing gait distances on various surfaces    PT Home Exercise Plan Access Code: [email or web address]    Consulted and Agree with Plan of Care Patient;Family member/caregiver    Family Member Consulted Wife             Patient  will benefit from skilled therapeutic intervention in order to improve the following deficits and impairments:  Abnormal gait, Decreased mobility, Decreased balance, Decreased activity tolerance, Decreased strength, Decreased knowledge of use of DME  Visit Diagnosis: Muscle weakness (generalized)  Hemiplegia and hemiparesis following cerebral infarction affecting right dominant side (HCC)  Other abnormalities of gait and mobility  Unsteadiness on feet     Problem List Patient Active Problem List   Diagnosis Date Noted   Cerebrovascular accident ([place]) 01/04/2018   Compression fracture of thoracic vertebra ([place]) 01/04/2018   AAA (abdominal aortic aneurysm) ([place]) 11/25/2017   History of stroke 01/15/2017   Hyperlipidemia LDL goal <70 04/18/2016   Hypertension    Hypertensive heart disease    Chest pain 11/16/2015   Type 2 diabetes mellitus with circulatory disorder, with long-term current use of insulin ([place]) 11/16/2015   Essential hypertension 11/16/2015   Thrombocytopenia ([place]) 11/16/2015   Leukopenia 11/16/2015   Hyponatremia 11/16/2015   Chronic combined systolic and diastolic CHF (congestive heart failure) ([place]) 11/16/2015   CAD in native artery    Brainstem infarct, acute ([place]) 09/17/2015   Occlusion and stenosis of vertebral artery with cerebral infarction ([place]) 09/17/2015   Obesity 09/17/2015   Ataxia, 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   Gait disturbance, 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   History of CVA (cerebrovascular accident) 08/09/2015    [facility] April Beatrix Shipper Nonato PT, DPT 12/17/2020, 4:28 PM  [facility] [address] [place], [name], [address] Phone: [phone]   Fax:  [phone]  Name: Jose Beck MRN: [id number] Date  of Birth: October 26, 1945

## 2020-12-19 DIAGNOSIS — E1151 Type 2 diabetes mellitus with diabetic peripheral angiopathy without gangrene: Secondary | ICD-10-CM | POA: Diagnosis not present

## 2020-12-21 ENCOUNTER — Ambulatory Visit: Payer: Medicare Other | Admitting: Physical Therapy

## 2020-12-25 ENCOUNTER — Other Ambulatory Visit: Payer: Self-pay

## 2020-12-25 ENCOUNTER — Ambulatory Visit: Payer: Medicare Other

## 2020-12-25 DIAGNOSIS — R2689 Other abnormalities of gait and mobility: Secondary | ICD-10-CM

## 2020-12-25 DIAGNOSIS — R2681 Unsteadiness on feet: Secondary | ICD-10-CM

## 2020-12-25 DIAGNOSIS — I69351 Hemiplegia and hemiparesis following cerebral infarction affecting right dominant side: Secondary | ICD-10-CM

## 2020-12-25 DIAGNOSIS — M6281 Muscle weakness (generalized): Secondary | ICD-10-CM

## 2020-12-26 NOTE — Therapy (Signed)
[facility] [address] [place], [name], [address] Phone: [phone]   Fax:  [phone]  Physical Therapy Treatment/Recert  Patient Details  Name: Jose Beck MRN: [id number] Date of Birth: [birth year] Referring Provider (PT): [name]   Encounter Date: 12/25/2020   PT End of Session - 12/25/20 1100     Visit Number 18    Number of Visits 25    Date for PT Re-Evaluation 01/19/21   60 day poc, 90 day cert   Authorization Type medicare so 10th visit progress note    Progress Note Due on Visit 20    PT Start Time 1058    PT Stop Time 1145    PT Time Calculation (min) 47 min    Equipment Utilized During Treatment Gait belt    Activity Tolerance Patient tolerated treatment well    Behavior During Therapy WFL for tasks assessed/performed             Past Medical History:  Diagnosis Date   Abdominal aortic aneurysm (AAA) ([place])    Aortic stenosis    a. 11/2015 Echo: EF 55-60%, Gr1 DD, mild AS.   Chronic combined systolic and diastolic CHF (congestive heart failure) ([place])    a. 07/2015: TEE showing EF of 40% b. 11/2015: Echo w/ EF 55-60%, Grade 1 DD, mild AS.   Coronary artery disease    a. patient reports 100% stenosis of LAD and RCA by cath in 1999. b. 11/2015 Cath: LM nl, LAD 100 - fills by L->L collats from D1, LCX 60d, OM1 lalrge/nl, OM2 small/nl, RCA 151m L->R collats.EF 35-45% (55-60% by echo).   Diabetes mellitus without complication ([place]    Type II   GERD (gastroesophageal reflux disease)    Hyperlipemia    Hypertension    Hypertensive heart disease    Stroke ([place]    a. 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   [place]rombocytopenia ([place] 11/2015    Past Surgical History:  Procedure Laterality Date   ABDOMINAL AORTIC ENDOVASCULAR STENT GRAFT N/A 11/25/2017   Procedure: ABDOMINAL AORTIC ENDOVASCULAR STENT GRAFT;  Surgeon: BSerafina Mitchell MD;  [facility]: [facility];  Service: Vascular;   Laterality: N/A;   CARDIAC CATHETERIZATION     CARDIAC CATHETERIZATION N/A 11/16/2015   Procedure: Left Heart Cath and Coronary Angiography;  Surgeon: HBelva Crome MD;  [facility];  Service: Cardiovascular;  Laterality: N/A;   MOLE REMOVAL     TONSILLECTOMY      There were no vitals filed for this visit.   Subjective Assessment - 12/25/20 1101     Subjective Pt reports that he is doing well. No issues with exercises.    Patient is accompained by: Family member   spouse   Pertinent History PMH: DM2, HTN, OA, ischemic CVA March 2017    Patient Stated Goals Pt would like to improve his balance and walking some.    Currently in Pain? No/denies                [facility] 12[facility]20 1102       Assessment   Medical Diagnosis hemiplegia and hemiparesis from CVA    Referring Provider (PT) RBurnard Bunting   Onset Date/Surgical Date 09/28/20                           [facility] - 12/25/20 1102  Transfers   Transfers Sit to Stand;Stand to Sit    Sit to Stand 5: Supervision;6: Modified independent (Device/Increase time)    Sit to Stand Details (indicate cue type and reason) increased time to rise    Stand to Sit 5: Supervision      Ambulation/Gait   Ambulation/Gait Yes    Ambulation/Gait Assistance 5: Supervision;4: Min guard    Ambulation/Gait Assistance Details Pt was cued to try to relax right arm with gait to get some arm swing as does tend to draw up. Also cued to increase right foot clearance and step length. As pt fatigued right foot clearance was more challenging especially in grass. Increased assistance and more reliance on cane at that time. After gait: HR=78, BP=130/86    Ambulation Distance (Feet) 800 Feet    Assistive device Straight cane    Gait Pattern Step-through pattern;Decreased step length - right;Decreased step length - left;Decreased hip/knee flexion - right;Poor foot clearance - right     Ambulation Surface Level;Unlevel;Indoor;Outdoor;Paved;Grass    Gait velocity 17.88 sec=0.51ms    Ramp 5: Supervision;4: Min assist    Gait Comments Step-ups on 6" step with RLE x 10 at bottom of stairs with UE support.      Standardized Balance Assessment   Standardized Balance Assessment Timed Up and Go Test      Timed Up and Go Test   TUG Normal TUG    Normal TUG (seconds) 19.03   20.11 and 17.96 sec with average of 19.03                   PT Education - 12/26/20 0818     Education Details Discussed recert plan for 4 weeks.    Person(s) Educated Patient;Spouse    Methods Explanation    Comprehension Verbalized understanding              PT Short Term Goals - 12/25/20 1132       PT SHORT TERM GOAL #1   Title Pt will ambulate >500' on level surfaces mod I with SPC for improved community mobility.    Baseline 11/06/20: met in session today    Status Achieved      PT SHORT TERM GOAL #2   Title Pt will decrease TUG from 19.87 sec to <16 sec for improved balance.    Baseline 11/06/20: 20.78 sec's with cane. 12/25/20 19.03 sec    Status Not Met      PT SHORT TERM GOAL #3   Title Pt will decrease 5 x sit to stand from 14.91 sec to <12 sec for improved balance and functional strength.    Baseline 11/06/20: 10.66 sec's hands on knees from standard height surface    Status Achieved    Target Date 11/04/20      PT SHORT TERM GOAL #4   Title Berg Balance will be performed to further assess balance and LTG written.    Baseline See long term goal below    Status Achieved    Target Date 11/04/20               PT Long Term Goals - 12/25/20 1133       PT LONG TERM GOAL #1   Title Pt will be independent with progressive HEP for strength and balance to continue gains on own.    Baseline 12/04/20: met with current program, will benefit from updating as pt progresses    Status Achieved      PT LONG TERM  GOAL #2   Title Pt will increase gait speed to >0.39ms  for improved gait safety in the community.    Baseline 12/04/20: 0.59 m/s with cane, improved just not to goal level. 12/25/20 0.52m    Status Not Met      PT LONG TERM GOAL #3   Title Pt will ambulate >800' on varied outdoor surfaces with SPC mod I for improved community mobility.    Baseline 12/04/20: unable to assess today due to storms/rain/, then power outage in clinic; 12/12/20: only tolerated ~442' and 6 min of walking. 12/25/20 800' supervision to min assist with cane    Status On-going      PT LONG TERM GOAL #4   Title Pt will be able to negotiate up/down ramp and curb with SPMid[facility] Centerupervision for improved community access.    Baseline 12/04/20: unable to assess due to safety concerns with power outage and use of flashlights only in clinic; 12/12/20: requires min/CGA for curbs using cane    Status Revised      PT LONG TERM GOAL #5   Title Pt will have improved Berg Balance score to at least 46/56 to demo MDIC and decreased fall risk    Baseline 12/04/20: 47/56 scored today    Status Achieved            Updated goals:  PT Short Term Goals - 12/26/20 0827       PT SHORT TERM GOAL #1   Title STGs=LTGs             PT Long Term Goals - 12/26/20 0827       PT LONG TERM GOAL #1   Title Pt will decrease TUG from 19.03 sec to <17 sec for improved balance and functional mobility. (LTGs due 01/19/21)    Baseline 12/25/20 19.03 sec    Time 4    Period Weeks    Status New    Target Date 01/19/21      PT LONG TERM GOAL #2   Title Pt will increase gait speed to >0.6548mfor improved gait safety in the community.    Baseline 12/04/20: 0.59 m/s with cane, improved just not to goal level. 12/25/20 0.19m41m  Time 4    Period Weeks    Status On-going    Target Date 01/19/21      PT LONG TERM GOAL #3   Title Pt will be able to ambulate over nonlevel surfaces including ramps, curbs, grass supervision for improved community access.    Time 4    Period Weeks    Status New    Target Date  01/19/21                   Plan - 12/25/20 1144     Clinical Impression Statement PT assessed remaining goals today. Pt has shown progress over course of therapy with improving gait distance with cane. He is challenged on nonlevel surfaces and does fatigue as goes on with RLE having more difficulty clearing. Gait speed has remained pretty consistent indicating more of a household ambulator. Pt improved on Berg Balance coming out of high fall risk category with score ot 47/56. Has also increased his 5 x sit to stand showing improving balance and functional mobility. No significant change with TUG. Pt will benefit from continued PT to continue to progress balance, strength, gait and improve activity tolerance.    Personal Factors and Comorbidities Comorbidity 3+;Time since onset of injury/illness/exacerbation  Comorbidities PMH: DM2, HTN, OA, ischemic CVA March 2017    Examination-Activity Limitations Stand;Locomotion Level;Transfers;Stairs;Squat    Examination-Participation Restrictions Community Activity;Yard Work;Cleaning    Stability/Clinical Decision Making Evolving/Moderate complexity    Rehab Potential Good    PT Frequency 2x / week   plus eval   PT Duration 4 weeks    PT Treatment/Interventions ADLs/Self Care Home Management;DME Instruction;Gait training;Stair training;Functional mobility training;Therapeutic activities;Therapeutic exercise;Balance training;Neuromuscular re-education;Manual techniques;Vestibular;Patient/family education    PT Next Visit Plan Next visit schedule out 2x/week for 2 more weeks to give 4 weeks total. continue to work on strengthening and balance, ramp, obstacle negotiation, and curbs with cane, increasing gait distances on various surfaces. Sci Fit for improving activity tolerance.    PT Home Exercise Plan Access Code: [email or web address]    Consulted and Agree with Plan of Care Patient;Family member/caregiver    Family Member Consulted Wife              Patient will benefit from skilled therapeutic intervention in order to improve the following deficits and impairments:  Abnormal gait, Decreased mobility, Decreased balance, Decreased activity tolerance, Decreased strength, Decreased knowledge of use of DME  Visit Diagnosis: Other abnormalities of gait and mobility  Muscle weakness (generalized)  Hemiplegia and hemiparesis following cerebral infarction affecting right dominant side (HCC)  Unsteadiness on feet     Problem List Patient Active Problem List   Diagnosis Date Noted   Cerebrovascular accident ([place]) 01/04/2018   Compression fracture of thoracic vertebra ([place]) 01/04/2018   AAA (abdominal aortic aneurysm) ([place]) 11/25/2017   History of stroke 01/15/2017   Hyperlipidemia LDL goal <70 04/18/2016   Hypertension    Hypertensive heart disease    Chest pain 11/16/2015   Type 2 diabetes mellitus with circulatory disorder, with long-term current use of insulin ([place]) 11/16/2015   Essential hypertension 11/16/2015   Thrombocytopenia ([place]) 11/16/2015   Leukopenia 11/16/2015   Hyponatremia 11/16/2015   Chronic combined systolic and diastolic CHF (congestive heart failure) ([place]) 11/16/2015   CAD in native artery    Brainstem infarct, acute ([place]) 09/17/2015   Occlusion and stenosis of vertebral artery with cerebral infarction ([place]) 09/17/2015   Obesity 09/17/2015   Ataxia, 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   Gait disturbance, 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   History of CVA (cerebrovascular accident) 08/09/2015    Electa Sniff, PT, DPT, NCS 12/26/2020, 8:27 AM  [facility] [address] [place], [name], [address] Phone: [phone]   Fax:  [phone]  Name: Jose Beck MRN: [id number] Date of Birth: Sep 17, 1945

## 2020-12-27 ENCOUNTER — Other Ambulatory Visit: Payer: Self-pay

## 2020-12-27 ENCOUNTER — Ambulatory Visit: Payer: Medicare Other

## 2020-12-27 DIAGNOSIS — I69351 Hemiplegia and hemiparesis following cerebral infarction affecting right dominant side: Secondary | ICD-10-CM | POA: Diagnosis not present

## 2020-12-27 DIAGNOSIS — R2689 Other abnormalities of gait and mobility: Secondary | ICD-10-CM | POA: Diagnosis not present

## 2020-12-27 DIAGNOSIS — M6281 Muscle weakness (generalized): Secondary | ICD-10-CM

## 2020-12-27 DIAGNOSIS — R2681 Unsteadiness on feet: Secondary | ICD-10-CM | POA: Diagnosis not present

## 2020-12-27 NOTE — Therapy (Signed)
[facility] [address] [place], [name], [address] Phone: [phone]   Fax:  [phone]  Physical Therapy Treatment  Patient Details  Name: Jose Beck MRN: [id number] Date of Birth: 30-Mar-1946 Referring Provider (PT): [name]   Encounter Date: 12/27/2020   PT End of Session - 12/27/20 1315     Visit Number 19    Number of Visits 25    Date for PT Re-Evaluation 01/19/21   60 day poc, 90 day cert   Authorization Type medicare so 10th visit progress note    Progress Note Due on Visit 20    PT Start Time 1314    PT Stop Time 1359    PT Time Calculation (min) 45 min    Equipment Utilized During Treatment Gait belt    Activity Tolerance Patient tolerated treatment well    Behavior During Therapy WFL for tasks assessed/performed             Past Medical History:  Diagnosis Date   Abdominal aortic aneurysm (AAA) ([place])    Aortic stenosis    a. 11/2015 Echo: EF 55-60%, Gr1 DD, mild AS.   Chronic combined systolic and diastolic CHF (congestive heart failure) ([place])    a. 07/2015: TEE showing EF of 40% b. 11/2015: Echo w/ EF 55-60%, Grade 1 DD, mild AS.   Coronary artery disease    a. patient reports 100% stenosis of LAD and RCA by cath in 1999. b. 11/2015 Cath: LM nl, LAD 100 - fills by L->L collats from D1, LCX 60d, OM1 lalrge/nl, OM2 small/nl, RCA 127m L->R collats.EF 35-45% (55-60% by echo).   Diabetes mellitus without complication ([place]    Type II   GERD (gastroesophageal reflux disease)    Hyperlipemia    Hypertension    Hypertensive heart disease    Stroke ([facility]    a. 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   Thrombocytopenia ([place] 11/2015    Past Surgical History:  Procedure Laterality Date   ABDOMINAL AORTIC ENDOVASCULAR STENT GRAFT N/A 11/25/2017   Procedure: ABDOMINAL AORTIC ENDOVASCULAR STENT GRAFT;  Surgeon: BSerafina Mitchell MD;  [facility];  Service: Vascular;  Laterality:  N/A;   CARDIAC CATHETERIZATION     CARDIAC CATHETERIZATION N/A 11/16/2015   Procedure: Left Heart Cath and Coronary Angiography;  Surgeon: HBelva Crome MD;  [facility];  Service: Cardiovascular;  Laterality: N/A;   MOLE REMOVAL     TONSILLECTOMY      There were no vitals filed for this visit.   Subjective Assessment - 12/27/20 1316     Subjective Pt denies any issues.    Patient is accompained by: Family member   spouse   Pertinent History PMH: DM2, HTN, OA, ischemic CVA March 2017    Patient Stated Goals Pt would like to improve his balance and walking some.    Currently in Pain? No/denies                               [facility]/Exercise - 12/27/20 1316       Ambulation/Gait   Ambulation/Gait Yes    Ambulation/Gait Assistance 5: Supervision    Ambulation/Gait Assistance Details Verbal cues to increase step length and try to relax right arm. Pt tends to stop when any distractions in pathway and wait until clear.    Ambulation Distance (Feet) 230 Feet  Assistive device Straight cane    Gait Pattern Step-through pattern;Decreased hip/knee flexion - right;Decreased step length - right;Decreased step length - left    Ambulation Surface Level;Indoor      Neuro Re-ed    Neuro Re-ed Details  In // bars: reciprocal steps over 2x4" bolsters with 1 UE support x 6 bouts then lateral stepping over with 1 UE support x 4 bouts. Step-ups on 4" step x 5 each leg with bar support on left then x 5 with cane support CGA. Verbal cues to tighten gluts to help with stability. In hallway with cane: gait 40' with head turns left/right then up/down x 40'. Pt did slow cadence and decrease steps some especially with looking up.      Knee/Hip Exercises: Aerobic   Recumbent Bike SciFit level 5.9 with hands at 6 and seat at 21 x 10 minutes for strengthening and improving activity tolerance. Pt cued to keep steps/minute >80. HR=82 after                       PT Short Term Goals - 12/26/20 0827       PT SHORT TERM GOAL #1   Title STGs=LTGs               PT Long Term Goals - 12/26/20 0827       PT LONG TERM GOAL #1   Title Pt will decrease TUG from 19.03 sec to <17 sec for improved balance and functional mobility. (LTGs due 01/19/21)    Baseline 12/25/20 19.03 sec    Time 4    Period Weeks    Status New    Target Date 01/19/21      PT LONG TERM GOAL #2   Title Pt will increase gait speed to >0.75ms for improved gait safety in the community.    Baseline 12/04/20: 0.59 m/s with cane, improved just not to goal level. 12/25/20 0.5110m    Time 4    Period Weeks    Status On-going    Target Date 01/19/21      PT LONG TERM GOAL #3   Title Pt will be able to ambulate over nonlevel surfaces including ramps, curbs, grass supervision for improved community access.    Time 4    Period Weeks    Status New    Target Date 01/19/21                   Plan - 12/27/20 1511     Clinical Impression Statement Pt focused on gait with trying to increase foot clearance with stepping over some obstacles. Pt relies more on UE support when having to step over something. Able to step up on 4" step with cane CGA/min assist with each foot. Pt had more lateral lean with left and took quicker step when performed on right. PT did note that pt does not do well with distractions with gait and stops when anything is in path.    Personal Factors and Comorbidities Comorbidity 3+;Time since onset of injury/illness/exacerbation    Comorbidities PMH: DM2, HTN, OA, ischemic CVA March 2017    Examination-Activity Limitations Stand;Locomotion Level;Transfers;Stairs;Squat    Examination-Participation Restrictions Community Activity;Yard Work;Cleaning    Stability/Clinical Decision Making Evolving/Moderate complexity    Rehab Potential Good    PT Frequency 2x / week   plus eval   PT Duration 4 weeks    PT Treatment/Interventions ADLs/Self  Care Home Management;DME Instruction;Gait training;Stair training;Functional mobility training;Therapeutic activities;Therapeutic  exercise;Balance training;Neuromuscular re-education;Manual techniques;Vestibular;Patient/family education    PT Next Visit Plan 10th visit progress note next visit. Gait with adding in distractions, continue to work on strengthening and balance, ramp, obstacle negotiation, and curbs with cane, increasing gait distances on various surfaces. Sci Fit for improving activity tolerance.    PT Home Exercise Plan Access Code: [email or web address]    Consulted and Agree with Plan of Care Patient;Family member/caregiver    Family Member Consulted Wife             Patient will benefit from skilled therapeutic intervention in order to improve the following deficits and impairments:  Abnormal gait, Decreased mobility, Decreased balance, Decreased activity tolerance, Decreased strength, Decreased knowledge of use of DME  Visit Diagnosis: Other abnormalities of gait and mobility  Unsteadiness on feet  Muscle weakness (generalized)     Problem List Patient Active Problem List   Diagnosis Date Noted   Cerebrovascular accident ([place]) 01/04/2018   Compression fracture of thoracic vertebra ([place]) 01/04/2018   AAA (abdominal aortic aneurysm) ([place]) 11/25/2017   History of stroke 01/15/2017   Hyperlipidemia LDL goal <70 04/18/2016   Hypertension    Hypertensive heart disease    Chest pain 11/16/2015   Type 2 diabetes mellitus with circulatory disorder, with long-term current use of insulin ([place]) 11/16/2015   Essential hypertension 11/16/2015   Thrombocytopenia ([place]) 11/16/2015   Leukopenia 11/16/2015   Hyponatremia 11/16/2015   Chronic combined systolic and diastolic CHF (congestive heart failure) ([place]) 11/16/2015   CAD in native artery    Brainstem infarct, acute ([place]) 09/17/2015   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

## 2021-01-01 ENCOUNTER — Other Ambulatory Visit: Payer: Self-pay

## 2021-01-01 ENCOUNTER — Ambulatory Visit: Payer: Medicare Other

## 2021-01-01 DIAGNOSIS — L570 Actinic keratosis: Secondary | ICD-10-CM | POA: Diagnosis not present

## 2021-01-01 DIAGNOSIS — D485 Neoplasm of uncertain behavior of skin: Secondary | ICD-10-CM | POA: Diagnosis not present

## 2021-01-01 DIAGNOSIS — M6281 Muscle weakness (generalized): Secondary | ICD-10-CM | POA: Diagnosis not present

## 2021-01-01 DIAGNOSIS — Z85828 Personal history of other malignant neoplasm of skin: Secondary | ICD-10-CM | POA: Diagnosis not present

## 2021-01-01 DIAGNOSIS — L820 Inflamed seborrheic keratosis: Secondary | ICD-10-CM | POA: Diagnosis not present

## 2021-01-01 DIAGNOSIS — D225 Melanocytic nevi of trunk: Secondary | ICD-10-CM | POA: Diagnosis not present

## 2021-01-01 DIAGNOSIS — L578 Other skin changes due to chronic exposure to nonionizing radiation: Secondary | ICD-10-CM | POA: Diagnosis not present

## 2021-01-01 DIAGNOSIS — D2222 Melanocytic nevi of left ear and external auricular canal: Secondary | ICD-10-CM | POA: Diagnosis not present

## 2021-01-01 DIAGNOSIS — L814 Other melanin hyperpigmentation: Secondary | ICD-10-CM | POA: Diagnosis not present

## 2021-01-01 DIAGNOSIS — Z86018 Personal history of other benign neoplasm: Secondary | ICD-10-CM | POA: Diagnosis not present

## 2021-01-01 DIAGNOSIS — I69351 Hemiplegia and hemiparesis following cerebral infarction affecting right dominant side: Secondary | ICD-10-CM | POA: Diagnosis not present

## 2021-01-01 DIAGNOSIS — R2689 Other abnormalities of gait and mobility: Secondary | ICD-10-CM

## 2021-01-01 DIAGNOSIS — L821 Other seborrheic keratosis: Secondary | ICD-10-CM | POA: Diagnosis not present

## 2021-01-01 DIAGNOSIS — R2681 Unsteadiness on feet: Secondary | ICD-10-CM | POA: Diagnosis not present

## 2021-01-01 NOTE — Therapy (Signed)
[facility] [address] [place], [name], [address] Phone: [phone]   Fax:  [phone]  Physical Therapy Treatment/Progress note  Patient Details  Name: Jose Beck MRN: [id number] Date of Birth: [birth year] Referring Provider (PT): [name]    Progress Note  Reporting period 11/27/20 to 01/01/21  See Note below for Objective Data and Assessment of Progress/Goals  Encounter Date: 01/01/2021   PT End of Session - 01/01/21 1403     Visit Number 20    Number of Visits 25    Date for PT Re-Evaluation 01/19/21   60 day poc, 90 day cert   Authorization Type medicare so 10th visit progress note    Progress Note Due on Visit 20    PT Start Time 1401    PT Stop Time 1445    PT Time Calculation (min) 44 min    Equipment Utilized During Treatment Gait belt    Activity Tolerance Patient tolerated treatment well    Behavior During Therapy WFL for tasks assessed/performed             Past Medical History:  Diagnosis Date   Abdominal aortic aneurysm (AAA) ([place])    Aortic stenosis    a. 11/2015 Echo: EF 55-60%, Gr1 DD, mild AS.   Chronic combined systolic and diastolic CHF (congestive heart failure) ([place])    a. 07/2015: TEE showing EF of 40% b. 11/2015: Echo w/ EF 55-60%, Grade 1 DD, mild AS.   Coronary artery disease    a. patient reports 100% stenosis of LAD and RCA by cath in 1999. b. 11/2015 Cath: LM nl, LAD 100 - fills by L->L collats from D1, LCX 60d, OM1 lalrge/nl, OM2 small/nl, RCA 137m L->R collats.EF 35-45% (55-60% by echo).   Diabetes mellitus without complication ([place]    Type II   GERD (gastroesophageal reflux disease)    Hyperlipemia    Hypertension    Hypertensive heart disease    Stroke ([place]    a. 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   Thrombocytopenia ([facility] 11/2015    Past Surgical History:  Procedure Laterality Date   ABDOMINAL AORTIC ENDOVASCULAR STENT GRAFT N/A 11/25/2017    Procedure: ABDOMINAL AORTIC ENDOVASCULAR STENT GRAFT;  Surgeon: BSerafina Mitchell MD;  Location: [facility];  Service: Vascular;  Laterality: N/A;   CARDIAC CATHETERIZATION     CARDIAC CATHETERIZATION N/A 11/16/2015   Procedure: Left Heart Cath and Coronary Angiography;  Surgeon: HBelva Crome MD;  [facility];  Service: Cardiovascular;  Laterality: N/A;   MOLE REMOVAL     TONSILLECTOMY      There were no vitals filed for this visit.   Subjective Assessment - 01/01/21 1403     Subjective Pt's wife reports they went to a graveside service. He was able to walk over grass and pine mulch on his own with cane.    Patient is accompained by: Family member   spouse   Pertinent History PMH: DM2, HTN, OA, ischemic CVA March 2017    Patient Stated Goals Pt would like to improve his balance and walking some.    Currently in Pain? No/denies                               [facility] 1403       Ambulation/Gait   Ambulation/Gait Yes    Ambulation/Gait Assistance 5: Supervision  Ambulation/Gait Assistance Details Verbal cues to increase step length.    Ambulation Distance (Feet) 230 Feet    Assistive device Straight cane    Gait Pattern Step-through pattern;Decreased step length - right;Decreased step length - left;Decreased arm swing - right    Ambulation Surface Level;Indoor      Neuro Re-ed    Neuro Re-ed Details  Gait with looking for 6 cones around track. Pt had to go around twice missing all the cones on right side the first round. Denies any lack of vision on right and peripheral vision was intact. Weaving in and out of 6 cones and over blue mat with SPC x 2 ( verbal cues to pick up feet and try to increase step lengths), tapping cone and then stepping over x 6 alternating his feet with cane and min assist. Pt less steady with trying to tap with RLE as less controlled but did improve some as went on. At bottom of steps:  alternating toe taps on step with cane support 5 x 2 each leg CGA. Standing on rockerboard positioned ant/post trying to maintain level eyes open x 30 sec x 2, looking up and down x 10, alternating taps on step with rail support x 10.      Knee/Hip Exercises: Aerobic   Recumbent Bike SciFit level 6.2 with hands at 6 and seat at 21 x 6 minutes for strengthening and improving activity tolerance. Pt cued to keep steps/minute >80. HR=72 after                      PT Short Term Goals - 12/26/20 0827       PT SHORT TERM GOAL #1   Title STGs=LTGs               PT Long Term Goals - 12/26/20 0827       PT LONG TERM GOAL #1   Title Pt will decrease TUG from 19.03 sec to <17 sec for improved balance and functional mobility. (LTGs due 01/19/21)    Baseline 12/25/20 19.03 sec    Time 4    Period Weeks    Status New    Target Date 01/19/21      PT LONG TERM GOAL #2   Title Pt will increase gait speed to >0.41ms for improved gait safety in the community.    Baseline 12/04/20: 0.59 m/s with cane, improved just not to goal level. 12/25/20 0.516m    Time 4    Period Weeks    Status On-going    Target Date 01/19/21      PT LONG TERM GOAL #3   Title Pt will be able to ambulate over nonlevel surfaces including ramps, curbs, grass supervision for improved community access.    Time 4    Period Weeks    Status New    Target Date 01/19/21                   Plan - 01/01/21 1450     Clinical Impression Statement PT continued to focus increasing step length with obstacles negotiation and with distractions. Pt does slow down with distractions. When looking for cones at times he would stop altogether. Did miss cones on right side but no noted vision cut. Pt may just be ignoring that side more with perhaps some inattention. He did have better steps walking out of clinic at end. On compliant surfaces pt is less steady and does not feel as comfortable or  stable with increasing SLS  time. Pt continues to benefit from skilled PT to progress balance, strength and gait to maximize safety and independence.    Personal Factors and Comorbidities Comorbidity 3+;Time since onset of injury/illness/exacerbation    Comorbidities PMH: DM2, HTN, OA, ischemic CVA March 2017    Examination-Activity Limitations Stand;Locomotion Level;Transfers;Stairs;Squat    Examination-Participation Restrictions Community Activity;Yard Work;Cleaning    Stability/Clinical Decision Making Evolving/Moderate complexity    Rehab Potential Good    PT Frequency 2x / week   plus eval   PT Duration 4 weeks    PT Treatment/Interventions ADLs/Self Care Home Management;DME Instruction;Gait training;Stair training;Functional mobility training;Therapeutic activities;Therapeutic exercise;Balance training;Neuromuscular re-education;Manual techniques;Vestibular;Patient/family education    PT Next Visit Plan Continue gait with adding in distractions, continue to work on strengthening and balance, ramp, obstacle negotiation, and curbs with cane, increasing gait distances on various surfaces. Sci Fit for improving activity tolerance.    PT Home Exercise Plan Access Code: [email or web address]    Consulted and Agree with Plan of Care Patient;Family member/caregiver    Family Member Consulted Wife             Patient will benefit from skilled therapeutic intervention in order to improve the following deficits and impairments:  Abnormal gait, Decreased mobility, Decreased balance, Decreased activity tolerance, Decreased strength, Decreased knowledge of use of DME  Visit Diagnosis: Other abnormalities of gait and mobility  Muscle weakness (generalized)     Problem List Patient Active Problem List   Diagnosis Date Noted   Cerebrovascular accident ([place]) 01/04/2018   Compression fracture of thoracic vertebra ([place]) 01/04/2018   AAA (abdominal aortic aneurysm) ([place]) 11/25/2017   History of stroke 01/15/2017   Hyperlipidemia LDL  goal <70 04/18/2016   Hypertension    Hypertensive heart disease    Chest pain 11/16/2015   Type 2 diabetes mellitus with circulatory disorder, with long-term current use of insulin ([place]) 11/16/2015   Essential hypertension 11/16/2015   Thrombocytopenia ([facility]) 11/16/2015   Leukopenia 11/16/2015   Hyponatremia 11/16/2015   Chronic combined systolic and diastolic CHF (congestive heart failure) ([facility]) 11/16/2015   CAD in native artery    Brainstem infarct, acute ([place]) 09/17/2015   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 [place], [name], [address] Phone: [phone]   Fax:  [phone]  Name: Jihaad Hagg MRN: [id number] Date of Birth: 1946/02/13

## 2021-01-04 ENCOUNTER — Ambulatory Visit: Payer: Medicare Other

## 2021-01-04 ENCOUNTER — Other Ambulatory Visit: Payer: Self-pay

## 2021-01-04 DIAGNOSIS — R2689 Other abnormalities of gait and mobility: Secondary | ICD-10-CM

## 2021-01-04 DIAGNOSIS — M6281 Muscle weakness (generalized): Secondary | ICD-10-CM

## 2021-01-04 DIAGNOSIS — I69351 Hemiplegia and hemiparesis following cerebral infarction affecting right dominant side: Secondary | ICD-10-CM | POA: Diagnosis not present

## 2021-01-04 DIAGNOSIS — R2681 Unsteadiness on feet: Secondary | ICD-10-CM | POA: Diagnosis not present

## 2021-01-04 NOTE — Therapy (Signed)
[facility] [address] [place], [name], [address] Phone: [phone]   Fax:  [phone]  Physical Therapy Treatment  Patient Details  Name: Jose Beck MRN: [id number] Date of Birth: [birth year] Referring Provider (PT): [name]   Encounter Date: 01/04/2021   PT End of Session - 01/04/21 1316     Visit Number 21    Number of Visits 25    Date for PT Re-Evaluation 01/19/21   60 day poc, 90 day cert   Authorization Type medicare so 10th visit progress note    Progress Note Due on Visit 20    PT Start Time 1315    PT Stop Time 1402    PT Time Calculation (min) 47 min    Equipment Utilized During Treatment Gait belt    Activity Tolerance Patient tolerated treatment well    Behavior During Therapy WFL for tasks assessed/performed             Past Medical History:  Diagnosis Date   Abdominal aortic aneurysm (AAA) ([facility] [place])    Aortic stenosis    a. 11/2015 Echo: EF 55-60%, Gr1 DD, mild AS.   Chronic combined systolic and diastolic CHF (congestive heart failure) ([place])    a. 07/2015: TEE showing EF of 40% b. 11/2015: Echo w/ EF 55-60%, Grade 1 DD, mild AS.   Coronary artery disease    a. patient reports 100% stenosis of LAD and RCA by cath in 1999. b. 11/2015 Cath: LM nl, LAD 100 - fills by L->L collats from D1, LCX 60d, OM1 lalrge/nl, OM2 small/nl, RCA 192m L->R collats.EF 35-45% (55-60% by echo).   Diabetes mellitus without complication ([place]    Type II   GERD (gastroesophageal reflux disease)    Hyperlipemia    Hypertension    Hypertensive heart disease    Stroke ([facility]    a. 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   Thrombocytopenia ([facility] 11/2015    Past Surgical History:  Procedure Laterality Date   ABDOMINAL AORTIC ENDOVASCULAR STENT GRAFT N/A 11/25/2017   Procedure: ABDOMINAL AORTIC ENDOVASCULAR STENT GRAFT;  Surgeon: BSerafina Mitchell MD;  [facility];  Service: Vascular;  Laterality:  N/A;   CARDIAC CATHETERIZATION     CARDIAC CATHETERIZATION N/A 11/16/2015   Procedure: Left Heart Cath and Coronary Angiography;  Surgeon: HBelva Crome MD;  [facility];  Service: Cardiovascular;  Laterality: N/A;   MOLE REMOVAL     TONSILLECTOMY      There were no vitals filed for this visit.   Subjective Assessment - 01/04/21 1316     Subjective Pt reports that he had diarhea yesterday. Has been doing ok today.    Patient is accompained by: Family member   spouse   Pertinent History PMH: DM2, HTN, OA, ischemic CVA March 2017    Patient Stated Goals Pt would like to improve his balance and walking some.    Currently in Pain? No/denies                               [facility] - [facility] 1318       Ambulation/Gait   Ambulation/Gait Yes    Ambulation/Gait Assistance 5: Supervision    Ambulation/Gait Assistance Details Throughout session with cane. Verbal cues to increase step length.    Assistive device Straight cane    Gait Pattern Step-through pattern;Decreased step length - right;Decreased  step length - left;Decreased arm swing - right;Wide base of support    Ambulation Surface Level;Indoor      Neuro Re-ed    Neuro Re-ed Details  Dynamic gait activities in hallway 40' x 2 each: gait with head turns left/right, up/down, stop/go on command, fast/slow on command. Pt was encouraged to try to increase step length. No real change in speed with encouragement. In // bars: tandem stance 30 sec x 2 each position with occasionally touching bars as needed CGA, tandem gait 8' x 4 with 1 UE support, alternating toe taps on 4" step x 10 with 1 UE support on rail then switched to cane for support and repeated x 10. Pt was cued to take his time and completely clear right foot on/off step. Marching gait 10' x 4 with 1 UE support      Exercises   Exercises Other Exercises      Knee/Hip Exercises: Aerobic   Recumbent Bike SciFit with  interval training today 2 min at 6.2 then increased to 7.2 for 2 min then back to 6, 2 then to 8 x 2 min then back to 6.2 to cool down. 10 minutes total working on improving activity tolerance. Pt reported feeling ok after.                      PT Short Term Goals - 12/26/20 0827       PT SHORT TERM GOAL #1   Title STGs=LTGs               PT Long Term Goals - 12/26/20 0827       PT LONG TERM GOAL #1   Title Pt will decrease TUG from 19.03 sec to <17 sec for improved balance and functional mobility. (LTGs due 01/19/21)    Baseline 12/25/20 19.03 sec    Time 4    Period Weeks    Status New    Target Date 01/19/21      PT LONG TERM GOAL #2   Title Pt will increase gait speed to >0.58ms for improved gait safety in the community.    Baseline 12/04/20: 0.59 m/s with cane, improved just not to goal level. 12/25/20 0.529m    Time 4    Period Weeks    Status On-going    Target Date 01/19/21      PT LONG TERM GOAL #3   Title Pt will be able to ambulate over nonlevel surfaces including ramps, curbs, grass supervision for improved community access.    Time 4    Period Weeks    Status New    Target Date 01/19/21                   Plan - 01/04/21 1623     Clinical Impression Statement PT continued to work on dynamic gait activities adding in distractions with head turns and speed changes. Pt does not have much change in speed at all. He did well with interval training on SciFit with no reports of being very tired.    Personal Factors and Comorbidities Comorbidity 3+;Time since onset of injury/illness/exacerbation    Comorbidities PMH: DM2, HTN, OA, ischemic CVA March 2017    Examination-Activity Limitations Stand;Locomotion Level;Transfers;Stairs;Squat    Examination-Participation Restrictions Community Activity;Yard Work;Cleaning    Stability/Clinical Decision Making Evolving/Moderate complexity    Rehab Potential Good    PT Frequency 2x / week   plus eval    PT Duration 4 weeks  PT Treatment/Interventions ADLs/Self Care Home Management;DME Instruction;Gait training;Stair training;Functional mobility training;Therapeutic activities;Therapeutic exercise;Balance training;Neuromuscular re-education;Manual techniques;Vestibular;Patient/family education    PT Next Visit Plan Continue gait with adding in distractions, continue to work on strengthening and balance, ramp, obstacle negotiation, and curbs with cane, increasing gait distances on various surfaces. Pt very challenged with stepping over obstacles. Sci Fit for improving activity tolerance. PT plans to d/c at end of current visits.    PT Home Exercise Plan Access Code: [email or web address]    Consulted and Agree with Plan of Care Patient;Family member/caregiver    Family Member Consulted Wife             Patient will benefit from skilled therapeutic intervention in order to improve the following deficits and impairments:  Abnormal gait, Decreased mobility, Decreased balance, Decreased activity tolerance, Decreased strength, Decreased knowledge of use of DME  Visit Diagnosis: Other abnormalities of gait and mobility  Muscle weakness (generalized)     Problem List Patient Active Problem List   Diagnosis Date Noted   Cerebrovascular accident ([place]) 01/04/2018   Compression fracture of thoracic vertebra ([place]) 01/04/2018   AAA (abdominal aortic aneurysm) ([place]) 11/25/2017   History of stroke 01/15/2017   Hyperlipidemia LDL goal <70 04/18/2016   Hypertension    Hypertensive heart disease    Chest pain 11/16/2015   Type 2 diabetes mellitus with circulatory disorder, with long-term current use of insulin ([place]) 11/16/2015   Essential hypertension 11/16/2015   Thrombocytopenia ([place]) 11/16/2015   Leukopenia 11/16/2015   Hyponatremia 11/16/2015   Chronic combined systolic and diastolic CHF (congestive heart failure) ([place]) 11/16/2015   CAD in native artery    Brainstem infarct, acute ([place]) 09/17/2015    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 [place], [name], [address] Phone: [phone]   Fax:  [phone]  Name: Esperanza Goosman MRN: [id number] Date of Birth: [birth year]

## 2021-01-09 ENCOUNTER — Ambulatory Visit: Payer: Medicare Other | Admitting: Physical Therapy

## 2021-01-09 ENCOUNTER — Other Ambulatory Visit: Payer: Self-pay

## 2021-01-09 ENCOUNTER — Encounter: Payer: Self-pay | Admitting: Physical Therapy

## 2021-01-09 DIAGNOSIS — R2689 Other abnormalities of gait and mobility: Secondary | ICD-10-CM

## 2021-01-09 DIAGNOSIS — I69351 Hemiplegia and hemiparesis following cerebral infarction affecting right dominant side: Secondary | ICD-10-CM

## 2021-01-09 DIAGNOSIS — R2681 Unsteadiness on feet: Secondary | ICD-10-CM | POA: Diagnosis not present

## 2021-01-09 DIAGNOSIS — M6281 Muscle weakness (generalized): Secondary | ICD-10-CM

## 2021-01-09 NOTE — Therapy (Addendum)
[facility] [address] [place], [name], [address] Phone: [phone]   Fax:  [phone]  Physical Therapy Treatment  Patient Details  Name: Jose Beck MRN: [id number] Date of Birth: 11/12/1945 Referring Provider (PT): [name]   Encounter Date: 01/09/2021   PT End of Session - 01/09/21 1536     Visit Number 22    Number of Visits 25    Date for PT Re-Evaluation 01/19/21   60 day poc, 90 day cert   Authorization Type medicare so 10th visit progress note    Progress Note Due on Visit 30    PT Start Time 1533    PT Stop Time 1611    PT Time Calculation (min) 38 min    Equipment Utilized During Treatment Gait belt    Activity Tolerance Patient tolerated treatment well    Behavior During Therapy WFL for tasks assessed/performed             Past Medical History:  Diagnosis Date   Abdominal aortic aneurysm (AAA) ([place])    Aortic stenosis    a. 11/2015 Echo: EF 55-60%, Gr1 DD, mild AS.   Chronic combined systolic and diastolic CHF (congestive heart failure) ([place])    a. 07/2015: TEE showing EF of 40% b. 11/2015: Echo w/ EF 55-60%, Grade 1 DD, mild AS.   Coronary artery disease    a. patient reports 100% stenosis of LAD and RCA by cath in 1999. b. 11/2015 Cath: LM nl, LAD 100 - fills by L->L collats from D1, LCX 60d, OM1 lalrge/nl, OM2 small/nl, RCA 174m L->R collats.EF 35-45% (55-60% by echo).   Diabetes mellitus without complication ([place] [place]    Type II   GERD (gastroesophageal reflux disease)    Hyperlipemia    Hypertension    Hypertensive heart disease    Stroke ([facility]    a. 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   Thrombocytopenia ([facility] 11/2015    Past Surgical History:  Procedure Laterality Date   ABDOMINAL AORTIC ENDOVASCULAR STENT GRAFT N/A 11/25/2017   Procedure: ABDOMINAL AORTIC ENDOVASCULAR STENT GRAFT;  Surgeon: BSerafina Mitchell MD;  [facility]: [facility];  Service: Vascular;  Laterality:  N/A;   CARDIAC CATHETERIZATION     CARDIAC CATHETERIZATION N/A 11/16/2015   Procedure: Left Heart Cath and Coronary Angiography;  Surgeon: HBelva Crome MD;  [facility];  Service: Cardiovascular;  Laterality: N/A;   MOLE REMOVAL     TONSILLECTOMY      There were no vitals filed for this visit.   Subjective Assessment - 01/09/21 1536     Subjective No new complaints. No falls or pain to report.    Patient is accompained by: Family member   spouse   Pertinent History PMH: DM2, HTN, OA, ischemic CVA March 2017    Patient Stated Goals Pt would like to improve his balance and walking some.    Currently in Pain? No/denies                    [facility] - 01/[facility]21 1537       Transfers   Transfers Sit to Stand;Stand to Sit    Sit to Stand 6: Modified independent (Device/Increase time)    Stand to Sit 6: Modified independent (Device/Increase time)      Ambulation/Gait   Ambulation/Gait Yes    Ambulation/Gait Assistance 5: Supervision    Ambulation/Gait Assistance Details cues to increase step length with  gait    Ambulation Distance (Feet) --   around clinic with session   Assistive device Straight cane    Gait Pattern Step-through pattern;Decreased step length - right;Decreased step length - left;Decreased arm swing - right;Wide base of support    Ambulation Surface Level;Indoor      High Level Balance   High Level Balance Activities Negotitating around obstacles;Negotiating over obstacles    High Level Balance Comments 4 cones in a line<> 3 bolsters with about 3-4 feet between them: with cane weaving around cones <>stepping over boslters for 6 laps with min guard assist.      Neuro Re-ed    Neuro Re-ed Details  for balance/muscle re-ed: gait around track working on speed changes, scanning all directions, and sudden stops/starts. minimal change noted in speed changes.      Knee/Hip Exercises: Aerobic   Other Aerobic Scifit with  UE/LE's working on interval training- 6.5 x 2 minutes, then 7.5 x 2 minutes, rotating for 8 minutes total with goal >/= 80 steps per minute for strengthening and activity tolerance                 Balance Exercises - 01/09/21 1559       Balance Exercises: Standing   Rockerboard Anterior/posterior;Lateral;EO;EC;30 seconds;Other reps (comment);Limitations    Rockerboard Limitations performed in both direcions on balance board with no UE support: rocking the board with emphasis on tall posture with EO, progressing to EC with min guard to min assist for balance; then holding the board steady for EC 30 sec's x 3 reps. min guard to min assist for balance.                 PT Short Term Goals - 12/26/20 0827       PT SHORT TERM GOAL #1   Title STGs=LTGs               PT Long Term Goals - 12/26/20 0827       PT LONG TERM GOAL #1   Title Pt will decrease TUG from 19.03 sec to <17 sec for improved balance and functional mobility. (LTGs due 01/19/21)    Baseline 12/25/20 19.03 sec    Time 4    Period Weeks    Status New    Target Date 01/19/21      PT LONG TERM GOAL #2   Title Pt will increase gait speed to >0.2ms for improved gait safety in the community.    Baseline 12/04/20: 0.59 m/s with cane, improved just not to goal level. 12/25/20 0.533m    Time 4    Period Weeks    Status On-going    Target Date 01/19/21      PT LONG TERM GOAL #3   Title Pt will be able to ambulate over nonlevel surfaces including ramps, curbs, grass supervision for improved community access.    Time 4    Period Weeks    Status New    Target Date 01/19/21                   Plan - 01/09/21 1537     Clinical Impression Statement Today's skilled session continued to focus on gait with emphasis on increased step length, strengthening and balance training. No issues noted or reported in session. The pt is making progress toward goals and should benefit from continued PT to  progress toward unmet goals.    Personal Factors and Comorbidities Comorbidity 3+;Time since onset of injury/illness/exacerbation  Comorbidities PMH: DM2, HTN, OA, ischemic CVA March 2017    Examination-Activity Limitations Stand;Locomotion Level;Transfers;Stairs;Squat    Examination-Participation Restrictions Community Activity;Yard Work;Cleaning    Stability/Clinical Decision Making Evolving/Moderate complexity    Rehab Potential Good    PT Frequency 2x / week   plus eval   PT Duration 4 weeks    PT Treatment/Interventions ADLs/Self Care Home Management;DME Instruction;Gait training;Stair training;Functional mobility training;Therapeutic activities;Therapeutic exercise;Balance training;Neuromuscular re-education;Manual techniques;Vestibular;Patient/family education    PT Next Visit Plan Continue gait with adding in distractions, continue to work on strengthening and balance, ramp, obstacle negotiation, and curbs with cane, increasing gait distances on various surfaces. Pt very challenged with stepping over obstacles. Sci Fit for improving activity tolerance. PT plans to d/c at end of current visits.    PT Home Exercise Plan Access Code: [email or web address]    Consulted and Agree with Plan of Care Patient;Family member/caregiver    Family Member Consulted Wife             Patient will benefit from skilled therapeutic intervention in order to improve the following deficits and impairments:  Abnormal gait, Decreased mobility, Decreased balance, Decreased activity tolerance, Decreased strength, Decreased knowledge of use of DME  Visit Diagnosis: Other abnormalities of gait and mobility  Muscle weakness (generalized)  Unsteadiness on feet  Hemiplegia and hemiparesis following cerebral infarction affecting right dominant side [facility])     Problem List Patient Active Problem List   Diagnosis Date Noted   Cerebrovascular accident ([place]) 01/04/2018   Compression fracture of thoracic vertebra ([place])  01/04/2018   AAA (abdominal aortic aneurysm) ([place]) 11/25/2017   History of stroke 01/15/2017   Hyperlipidemia LDL goal <70 04/18/2016   Hypertension    Hypertensive heart disease    Chest pain 11/16/2015   Type 2 diabetes mellitus with circulatory disorder, with long-term current use of insulin ([place]) 11/16/2015   Essential hypertension 11/16/2015   Thrombocytopenia ([place]) 11/16/2015   Leukopenia 11/16/2015   Hyponatremia 11/16/2015   Chronic combined systolic and diastolic CHF (congestive heart failure) ([place]) 11/16/2015   CAD in native artery    Brainstem infarct, acute ([place]) 09/17/2015   Occlusion and stenosis of vertebral artery with cerebral infarction ([place]) 09/17/2015   Obesity 09/17/2015   Ataxia, 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   Gait disturbance, 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   History of CVA (cerebrovascular accident) 08/09/2015   Willow Ora, PTA, [facility] [address] [place], [place] [address] [phone] 01/10/21, 8:03 PM   Name: Jose Beck MRN: [id number] Date of Birth: [birth year]

## 2021-01-11 ENCOUNTER — Other Ambulatory Visit: Payer: Self-pay

## 2021-01-11 ENCOUNTER — Ambulatory Visit: Payer: Medicare Other | Attending: Internal Medicine | Admitting: Physical Therapy

## 2021-01-11 DIAGNOSIS — R2681 Unsteadiness on feet: Secondary | ICD-10-CM | POA: Diagnosis not present

## 2021-01-11 DIAGNOSIS — M6281 Muscle weakness (generalized): Secondary | ICD-10-CM | POA: Diagnosis not present

## 2021-01-11 DIAGNOSIS — R2689 Other abnormalities of gait and mobility: Secondary | ICD-10-CM | POA: Diagnosis not present

## 2021-01-11 DIAGNOSIS — I69351 Hemiplegia and hemiparesis following cerebral infarction affecting right dominant side: Secondary | ICD-10-CM | POA: Diagnosis not present

## 2021-01-14 NOTE — Therapy (Signed)
[facility] [address] [place], [name], [address] Phone: [phone]   Fax:  [phone]  Physical Therapy Treatment  Patient Details  Name: Jose Beck MRN: [id number] Date of Birth: [birth year] Referring Provider (PT): [name]   Encounter Date: 01/11/2021    01/11/21 1407  PT Visits / Re-Eval  Visit Number 23  Number of Visits 25  Date for PT Re-Evaluation 01/19/21 (60 day poc, 90 day cert)  Authorization  Authorization Type medicare so 10th visit progress note  Progress Note Due on Visit 30  PT Time Calculation  PT Start Time 1403  PT Stop Time 1445  PT Time Calculation (min) 42 min  PT - End of Session  Equipment Utilized During Treatment Gait belt  Activity Tolerance Patient tolerated treatment well  Behavior During Therapy WFL for tasks assessed/performed    Past Medical History:  Diagnosis Date   Abdominal aortic aneurysm (AAA) ([place])    Aortic stenosis    a. 11/2015 Echo: EF 55-60%, Gr1 DD, mild AS.   Chronic combined systolic and diastolic CHF (congestive heart failure) ([place])    a. 07/2015: TEE showing EF of 40% b. 11/2015: Echo w/ EF 55-60%, Grade 1 DD, mild AS.   Coronary artery disease    a. patient reports 100% stenosis of LAD and RCA by cath in 1999. b. 11/2015 Cath: LM nl, LAD 100 - fills by L->L collats from D1, LCX 60d, OM1 lalrge/nl, OM2 small/nl, RCA 163m L->R collats.EF 35-45% (55-60% by echo).   Diabetes mellitus without complication ([facility]    Type II   GERD (gastroesophageal reflux disease)    Hyperlipemia    Hypertension    Hypertensive heart disease    Stroke ([facility]    a. 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   Thrombocytopenia ([place] 11/2015    Past Surgical History:  Procedure Laterality Date   ABDOMINAL AORTIC ENDOVASCULAR STENT GRAFT N/A 11/25/2017   Procedure: ABDOMINAL AORTIC ENDOVASCULAR STENT GRAFT;  Surgeon: BSerafina Mitchell MD;  Location: [facility];   Service: Vascular;  Laterality: N/A;   CARDIAC CATHETERIZATION     CARDIAC CATHETERIZATION N/A 11/16/2015   Procedure: Left Heart Cath and Coronary Angiography;  Surgeon: HBelva Crome MD;  [facility];  Service: Cardiovascular;  Laterality: N/A;   MOLE REMOVAL     TONSILLECTOMY      There were no vitals filed for this visit.    01/11/21 1406  Symptoms/Limitations  Subjective No new complaints. No falls or pain to report. Pt drove them to therapy today.  Patient is accompained by: Family member (spouse)  Pertinent History PMH: DM2, HTN, OA, ischemic CVA March 2017  Patient Stated Goals Pt would like to improve his balance and walking some.  Pain Assessment  Currently in Pain? No/denies       01/11/21 1407  Transfers  Transfers Sit to Stand;Stand to Sit  Sit to Stand 6: Modified independent (Device/Increase time)  Stand to Sit 6: Modified independent (Device/Increase time)  Ambulation/Gait  Ambulation/Gait Yes  Ambulation/Gait Assistance 5: Supervision  Ambulation/Gait Assistance Details working on increased activity tolerance- 3 laps of gait, rest for 1 minute repeat for 2 more sets of 3 laps.  Ambulation Distance (Feet) 345 Feet (x3 rep)  Assistive device Straight cane  Gait Pattern Step-through pattern;Decreased step length - right;Decreased step length - left;Decreased arm swing - right;Wide base of support  Ambulation Surface Level;Indoor     01/11/21 1424  Balance  Exercises: Standing  Standing Eyes Closed Narrow base of support (BOS);Wide (BOA);Foam/compliant surface;Head turns;Other reps (comment);30 secs;Limitations  Standing Eyes Closed Limitations on airex no UE support: feet together for EC 30 seconds x 3 reps, then feet apart for EC head movements left<>right up<>down, diagonals both ways. min guard to min assist for balance with cues on posture and weight shifting to assist with balance.  Balance Beam standing across red foam beam: alternating  forward stepping to floor/back onto beam with left UE support only for 10 reps each side, then alternating backward stepping to floor/back onto beam for ~10 reps each side. min to mod assist with cues for increased step length/height.        PT Short Term Goals - 12/26/20 0827       PT SHORT TERM GOAL #1   Title STGs=LTGs               PT Long Term Goals - 12/26/20 0827       PT LONG TERM GOAL #1   Title Pt will decrease TUG from 19.03 sec to <17 sec for improved balance and functional mobility. (LTGs due 01/19/21)    Baseline 12/25/20 19.03 sec    Time 4    Period Weeks    Status New    Target Date 01/19/21      PT LONG TERM GOAL #2   Title Pt will increase gait speed to >0.62ms for improved gait safety in the community.    Baseline 12/04/20: 0.59 m/s with cane, improved just not to goal level. 12/25/20 0.511m    Time 4    Period Weeks    Status On-going    Target Date 01/19/21      PT LONG TERM GOAL #3   Title Pt will be able to ambulate over nonlevel surfaces including ramps, curbs, grass supervision for improved community access.    Time 4    Period Weeks    Status New    Target Date 01/19/21               01/11/21 1407  Plan  Clinical Impression Statement Today's skilled session continued to focus on activity tolerance and balance training with rest breaks taken as needed. No other issues noted or reported. The pt is making steady progress toward unmet goals and should benefit from continued PT to progress toward unmet goals.  Personal Factors and Comorbidities Comorbidity 3+;Time since onset of injury/illness/exacerbation  Comorbidities PMH: DM2, HTN, OA, ischemic CVA March 2017  Examination-Activity Limitations Stand;Locomotion Level;Transfers;Stairs;Squat  Examination-Participation Restrictions Community Activity;Yard Work;Cleaning  Pt will benefit from skilled therapeutic intervention in order to improve on the following deficits Abnormal  gait;Decreased mobility;Decreased balance;Decreased activity tolerance;Decreased strength;Decreased knowledge of use of DME  Stability/Clinical Decision Making Evolving/Moderate complexity  Rehab Potential Good  PT Frequency 2x / week (plus eval)  PT Duration 4 weeks  PT Treatment/Interventions ADLs/Self Care Home Management;DME Instruction;Gait training;Stair training;Functional mobility training;Therapeutic activities;Therapeutic exercise;Balance training;Neuromuscular re-education;Manual techniques;Vestibular;Patient/family education  PT Next Visit Plan Continue gait with adding in distractions, continue to work on strengthening and balance, ramp, obstacle negotiation, and curbs with cane, increasing gait distances on various surfaces. Pt very challenged with stepping over obstacles. Sci Fit for improving activity tolerance. PT plans to d/c at end of current visits.  PT Home Exercise Plan Access Code: [email or web address] and Agree with Plan of Care Patient;Family member/caregiver  Family Member Consulted Wife         Patient will benefit from skilled  therapeutic intervention in order to improve the following deficits and impairments:  Abnormal gait, Decreased mobility, Decreased balance, Decreased activity tolerance, Decreased strength, Decreased knowledge of use of DME  Visit Diagnosis: Other abnormalities of gait and mobility  Muscle weakness (generalized)  Unsteadiness on feet  Hemiplegia and hemiparesis following cerebral infarction affecting right dominant side [facility])     Problem List Patient Active Problem List   Diagnosis Date Noted   Cerebrovascular accident ([place]) 01/04/2018   Compression fracture of thoracic vertebra ([place]) 01/04/2018   AAA (abdominal aortic aneurysm) ([place]) 11/25/2017   History of stroke 01/15/2017   Hyperlipidemia LDL goal <70 04/18/2016   Hypertension    Hypertensive heart disease    Chest pain 11/16/2015   Type 2 diabetes mellitus with  circulatory disorder, with long-term current use of insulin ([place]) 11/16/2015   Essential hypertension 11/16/2015   Thrombocytopenia ([place]) 11/16/2015   Leukopenia 11/16/2015   Hyponatremia 11/16/2015   Chronic combined systolic and diastolic CHF (congestive heart failure) ([place]) 11/16/2015   CAD in native artery    Brainstem infarct, acute ([place]) 09/17/2015   Occlusion and stenosis of vertebral artery with cerebral infarction ([place]) 09/17/2015   Obesity 09/17/2015   Ataxia, 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   Gait disturbance, 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   History of CVA (cerebrovascular accident) 08/09/2015   Willow Ora, PTA, [facility] Outpatient Neuro [facility] [address], [place], [place] [address] [phone] 01/14/21, 9:40 PM   Name: Traycen Cieslik MRN: [id number] Date of Birth: 1946-01-31

## 2021-01-15 ENCOUNTER — Ambulatory Visit: Payer: Medicare Other

## 2021-01-15 ENCOUNTER — Other Ambulatory Visit: Payer: Self-pay

## 2021-01-15 DIAGNOSIS — M6281 Muscle weakness (generalized): Secondary | ICD-10-CM

## 2021-01-15 DIAGNOSIS — I69351 Hemiplegia and hemiparesis following cerebral infarction affecting right dominant side: Secondary | ICD-10-CM

## 2021-01-15 DIAGNOSIS — R2689 Other abnormalities of gait and mobility: Secondary | ICD-10-CM | POA: Diagnosis not present

## 2021-01-15 DIAGNOSIS — R2681 Unsteadiness on feet: Secondary | ICD-10-CM | POA: Diagnosis not present

## 2021-01-15 NOTE — Therapy (Signed)
[facility] [address] [place], [name], [address] Phone: [phone]   Fax:  [phone]  Physical Therapy Treatment  Patient Details  Name: Jose Beck MRN: [id number] Date of Birth: 1946-03-07 Referring Provider (PT): [name]   Encounter Date: 01/15/2021   PT End of Session - 01/15/21 1322     Visit Number 24    Number of Visits 25    Date for PT Re-Evaluation 01/19/21   60 day poc, 90 day cert   Authorization Type medicare so 10th visit progress note    Progress Note Due on Visit 30    PT Start Time 1315    PT Stop Time 1400    PT Time Calculation (min) 45 min    Equipment Utilized During Treatment Gait belt    Activity Tolerance Patient tolerated treatment well    Behavior During Therapy WFL for tasks assessed/performed             Past Medical History:  Diagnosis Date   Abdominal aortic aneurysm (AAA) ([place])    Aortic stenosis    a. 11/2015 Echo: EF 55-60%, Gr1 DD, mild AS.   Chronic combined systolic and diastolic CHF (congestive heart failure) ([place])    a. 07/2015: TEE showing EF of 40% b. 11/2015: Echo w/ EF 55-60%, Grade 1 DD, mild AS.   Coronary artery disease    a. patient reports 100% stenosis of LAD and RCA by cath in 1999. b. 11/2015 Cath: LM nl, LAD 100 - fills by L->L collats from D1, LCX 60d, OM1 lalrge/nl, OM2 small/nl, RCA 141m L->R collats.EF 35-45% (55-60% by echo).   Diabetes mellitus without complication ([place]    Type II   GERD (gastroesophageal reflux disease)    Hyperlipemia    Hypertension    Hypertensive heart disease    Stroke ([place]    a. 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   Thrombocytopenia ([place] 11/2015    Past Surgical History:  Procedure Laterality Date   ABDOMINAL AORTIC ENDOVASCULAR STENT GRAFT N/A 11/25/2017   Procedure: ABDOMINAL AORTIC ENDOVASCULAR STENT GRAFT;  Surgeon: BSerafina Mitchell MD;  [facility];  Service: Vascular;  Laterality:  N/A;   CARDIAC CATHETERIZATION     CARDIAC CATHETERIZATION N/A 11/16/2015   Procedure: Left Heart Cath and Coronary Angiography;  Surgeon: HBelva Crome MD;  [facility];  Service: Cardiovascular;  Laterality: N/A;   MOLE REMOVAL     TONSILLECTOMY      There were no vitals filed for this visit.   Subjective Assessment - 01/15/21 1321     Subjective No changes to report, meds same, no falls or LOB to note    Patient is accompained by: Family member   spouse   Pertinent History PMH: DM2, HTN, OA, ischemic CVA March 2017    Patient Stated Goals Pt would like to improve his balance and walking some.                [id number] - 01/15/21 0001       Ambulation/Gait   Gait velocity 0.41 m/s (24.2s)    Ramp 5: Supervision;4: Min assist      Timed Up and Go Test   Normal TUG (seconds) 17.5    TUG Comments performed with cane                           [facility] Adult PT  Treatment/Exercise - 01/15/21 0001       Transfers   Transfers Sit to Stand    Sit to Stand 6: Modified independent (Device/Increase time)    Stand to Sit 6: Modified independent (Device/Increase time)      Ambulation/Gait   Ambulation/Gait Yes    Ambulation/Gait Assistance 5: Supervision    Ambulation Distance (Feet) 230 Feet    Assistive device Straight cane    Gait Pattern Step-through pattern;Decreased step length - right;Decreased step length - left;Decreased arm swing - right;Wide base of support    Ambulation Surface Level;Indoor    Ramp Details (indicate cue type and reason) with cane, CGA at first, S for second trial.    Curb Not tested (comment)    Curb Details (indicate cue type and reason) Able to ascend with MinA, too apprehensive to sedcend    Gait Comments practiced stepping over orange stripes on floor to encourage increased step length      Knee/Hip Exercises: Aerobic   Other Aerobic Scifit with UE/LE's seat 20, arms 7 L2 8'                  Balance Exercises - 01/15/21 0001       Balance Exercises: Standing   SLS with Vectors Solid surface;Upper extremity assist 1;Limitations    SLS with Vectors Limitations 2 targets unilateral, 2 targets, alt, 2 targets unilateral cross body, 2 targets alt cross body, 1 target alt  all to 10 reps per LE                 PT Short Term Goals - 12/26/20 0827       PT SHORT TERM GOAL #1   Title STGs=LTGs               PT Long Term Goals - 01/15/21 1430       PT LONG TERM GOAL #1   Title Pt will decrease TUG from 19.03 sec to <17 sec for improved balance and functional mobility. (LTGs due 01/19/21)    Baseline 12/25/20 19.03 sec; 01/15/21 TUG 17.5s with cane    Time 4    Period Weeks    Status Partially Met      PT LONG TERM GOAL #2   Title Pt will increase gait speed to >0.21ms for improved gait safety in the community.    Baseline 12/04/20: 0.59 m/s with cane, improved just not to goal level. 12/25/20 0.531m; 01/15/21 Gait velocity 0.41 m/s    Time 4    Period Weeks    Status On-going      PT LONG TERM GOAL #3   Title Pt will be able to ambulate over nonlevel surfaces including ramps, curbs, grass supervision for improved community access.    Baseline 01/15/21 Able to negotiate clinic ramp 2x under SBA using cane, ascends curb with MinA, too reluctant to descend w/o MAx A    Time 4    Period Weeks    Status Partially Met                    Patient will benefit from skilled therapeutic intervention in order to improve the following deficits and impairments:     Visit Diagnosis: Unsteadiness on feet  Other abnormalities of gait and mobility  Muscle weakness (generalized)  Hemiplegia and hemiparesis following cerebral infarction affecting right dominant side ([facility]    Problem List Patient Active Problem List   Diagnosis Date Noted   [facility]vascular [facility] ([facility]  01/04/2018   Compression fracture of thoracic vertebra ([place]) 01/04/2018   AAA  (abdominal aortic aneurysm) ([place]) 11/25/2017   History of stroke 01/15/2017   Hyperlipidemia LDL goal <70 04/18/2016   Hypertension    Hypertensive heart disease    Chest pain 11/16/2015   Type 2 diabetes mellitus with circulatory disorder, with long-term current use of insulin ([place]) 11/16/2015   Essential hypertension 11/16/2015   Thrombocytopenia ([place]) 11/16/2015   Leukopenia 11/16/2015   Hyponatremia 11/16/2015   Chronic combined systolic and diastolic CHF (congestive heart failure) ([place]) 11/16/2015   CAD in native artery    Brainstem infarct, acute ([place]) 09/17/2015   Occlusion and stenosis of vertebral artery with cerebral infarction ([place]) 09/17/2015   Obesity 09/17/2015   Ataxia, 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   Gait disturbance, 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   History of CVA (cerebrovascular accident) 08/09/2015    Lanice Shirts PT 01/15/2021, 2:36 PM  [facility] [address] [place], [name], [address] Phone: [phone]   Fax:  [phone]  Name: Jose Beck MRN: [id number] Date of Birth: [birth year]

## 2021-01-18 ENCOUNTER — Other Ambulatory Visit: Payer: Self-pay

## 2021-01-18 ENCOUNTER — Ambulatory Visit: Payer: Medicare Other

## 2021-01-18 DIAGNOSIS — R2681 Unsteadiness on feet: Secondary | ICD-10-CM | POA: Diagnosis not present

## 2021-01-18 DIAGNOSIS — R2689 Other abnormalities of gait and mobility: Secondary | ICD-10-CM | POA: Diagnosis not present

## 2021-01-18 DIAGNOSIS — M6281 Muscle weakness (generalized): Secondary | ICD-10-CM

## 2021-01-18 DIAGNOSIS — I69351 Hemiplegia and hemiparesis following cerebral infarction affecting right dominant side: Secondary | ICD-10-CM | POA: Diagnosis not present

## 2021-01-19 NOTE — Therapy (Signed)
[facility] [address] [place], [name], [address] Phone: [phone]   Fax:  [phone]  Physical Therapy Treatment/Discharge Summary  Patient Details  Name: Jose Beck MRN: [id number] Date of Birth: [birth year] Referring Provider (PT): [name]  PHYSICAL THERAPY DISCHARGE SUMMARY  Visits from Start of Care: 25  Current functional level related to goals / functional outcomes: See clinical impression.   Remaining deficits: Right hemiparesis with coordination and balance deficits   Education / Equipment: Kasandra Knudsen, HEP   Patient agrees to discharge. Patient goals were not met. Patient is being discharged due to lack of progress.  Encounter Date: 01/18/2021   PT End of Session - 01/18/21 1320     Visit Number 25    Number of Visits 25    Date for PT Re-Evaluation 01/19/21   60 day poc, 90 day cert   Authorization Type medicare so 10th visit progress note    Progress Note Due on Visit 1    PT Start Time 1318    PT Stop Time 1358    PT Time Calculation (min) 40 min    Equipment Utilized During Treatment Gait belt    Activity Tolerance Patient tolerated treatment well    Behavior During Therapy WFL for tasks assessed/performed             Past Medical History:  Diagnosis Date   Abdominal aortic aneurysm (AAA) ([place])    Aortic stenosis    a. 11/2015 Echo: EF 55-60%, Gr1 DD, mild AS.   Chronic combined systolic and diastolic CHF (congestive heart failure) ([place])    a. 07/2015: TEE showing EF of 40% b. 11/2015: Echo w/ EF 55-60%, Grade 1 DD, mild AS.   Coronary artery disease    a. patient reports 100% stenosis of LAD and RCA by cath in 1999. b. 11/2015 Cath: LM nl, LAD 100 - fills by L->L collats from D1, LCX 60d, OM1 lalrge/nl, OM2 small/nl, RCA 134m L->R collats.EF 35-45% (55-60% by echo).   Diabetes mellitus without complication ([facility]    Type II   GERD (gastroesophageal reflux disease)     Hyperlipemia    Hypertension    Hypertensive heart disease    Stroke ([facility]    a. 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   Thrombocytopenia ([facility] 11/2015    Past Surgical History:  Procedure Laterality Date   ABDOMINAL AORTIC ENDOVASCULAR STENT GRAFT N/A 11/25/2017   Procedure: ABDOMINAL AORTIC ENDOVASCULAR STENT GRAFT;  Surgeon: BSerafina Mitchell MD;  Location: [facility];  Service: Vascular;  Laterality: N/A;   CARDIAC CATHETERIZATION     CARDIAC CATHETERIZATION N/A 11/16/2015   Procedure: Left Heart Cath and Coronary Angiography;  Surgeon: HBelva Crome MD;  [facility];  Service: Cardiovascular;  Laterality: N/A;   MOLE REMOVAL     TONSILLECTOMY      There were no vitals filed for this visit.   Subjective Assessment - 01/18/21 1320     Subjective Pt denies any new changes.    Patient is accompained by: Family member   spouse   Pertinent History PMH: DM2, HTN, OA, ischemic CVA March 2017    Patient Stated Goals Pt would like to improve his balance and walking some.    Currently in Pain? No/denies                               [facility]  Treatment/Exercise - 01/18/21 1321       Ambulation/Gait   Ambulation/Gait Yes    Ambulation/Gait Assistance 5: Supervision;4: Min guard    Ambulation/Gait Assistance Details Pt was cued to increase step length and try to keep speed going. Over grass cued to try to increase step clearance as steps get very small on grass.    Ambulation Distance (Feet) 500 Feet    Assistive device Straight cane    Gait Pattern Step-through pattern;Decreased arm swing - right;Decreased arm swing - left;Decreased step length - right;Decreased step length - left    Ambulation Surface Level;Unlevel;Indoor;Outdoor;Grass    Gait velocity 0.18ms    Curb 4: Min assist    Curb Details (indicate cue type and reason) with ascent CGA, min assist with descent more for safety. Pt slides right heel down step to get down. Performed x 2       Neuro Re-ed    Neuro Re-ed Details  In // bars: standing on airex with feet apart eyes open x 30 sec, eyes closed 30 sec x 2, alternating toe taps on cone x 10 with LUE support only with decreased control with RLE placement. Standing on airex with feet together eyes open x 30 sec. Standing on floor tapping 3 different colored targets in front with RLE working on coordination x 12 then switched to LLE tapping the colors x 6 with improved control.                     PT Education - 01/19/21 1140     Education Details Discussed discharge as planned    Person(s) Educated Patient;Spouse    Methods Explanation    Comprehension Verbalized understanding              PT Short Term Goals - 12/26/20 0827       PT SHORT TERM GOAL #1   Title STGs=LTGs               PT Long Term Goals - 01/19/21 1141       PT LONG TERM GOAL #1   Title Pt will decrease TUG from 19.03 sec to <17 sec for improved balance and functional mobility. (LTGs due 01/19/21)    Baseline 12/25/20 19.03 sec; 01/15/21 TUG 17.5s with cane    Time 4    Period Weeks    Status Partially Met      PT LONG TERM GOAL #2   Title Pt will increase gait speed to >0.674m for improved gait safety in the community.    Baseline 12/04/20: 0.59 m/s with cane, improved just not to goal level. 12/25/20 0.5534m 01/15/21 Gait velocity 0.41 m/s. 01/18/21 0.61m68m  Time 4    Period Weeks    Status Not Met      PT LONG TERM GOAL #3   Title Pt will be able to ambulate over nonlevel surfaces including ramps, curbs, grass supervision for improved community access.    Baseline 01/15/21 Able to negotiate clinic ramp 2x under SBA using cane, ascends curb with MinA, too reluctant to descend w/o MAx A. 01/18/21 mod I on level sidewalk, CGA on grass, min assist on curb with cane.    Time 4    Period Weeks    Status Partially Met                   Plan - 01/19/21 1142     Clinical Impression Statement PT assessed remaining  goals today. Overall  pt is starting to plateau with further progress. He was able to decrease TUG slightly but short of goal and still indicates fall risk. He is ambulating with cane on level surfaces mod I with slow gait speed. Has not been able to increase gait speed over last 4 weeks. Nonlevel surfaces are more challenging for pt with CGA on grass as greatly decreases his steps, min assist on curb and supervision on ramp. Pt continues to have some strength and coordination deficits on RLE. PT discharging at this time and pt in agreement.    Personal Factors and Comorbidities Comorbidity 3+;Time since onset of injury/illness/exacerbation    Comorbidities PMH: DM2, HTN, OA, ischemic CVA March 2017    Examination-Activity Limitations Stand;Locomotion Level;Transfers;Stairs;Squat    Examination-Participation Restrictions Community Activity;Yard Work;Cleaning    Stability/Clinical Decision Making Evolving/Moderate complexity    Rehab Potential Good    PT Frequency 2x / week   plus eval   PT Duration 4 weeks    PT Treatment/Interventions ADLs/Self Care Home Management;DME Instruction;Gait training;Stair training;Functional mobility training;Therapeutic activities;Therapeutic exercise;Balance training;Neuromuscular re-education;Manual techniques;Vestibular;Patient/family education    PT Next Visit Plan [place] today    PT Home Exercise Plan Access Code: [email or web address]    Consulted and Agree with Plan of Care Patient;Family member/caregiver    Family Member Consulted Wife             Patient will benefit from skilled therapeutic intervention in order to improve the following deficits and impairments:  Abnormal gait, Decreased mobility, Decreased balance, Decreased activity tolerance, Decreased strength, Decreased knowledge of use of DME  Visit Diagnosis: Other abnormalities of gait and mobility  Muscle weakness (generalized)     Problem List Patient Active Problem List   Diagnosis Date Noted    Cerebrovascular accident ([place]) 01/04/2018   Compression fracture of thoracic vertebra ([place]) 01/04/2018   AAA (abdominal aortic aneurysm) ([place]) 11/25/2017   History of stroke 01/15/2017   Hyperlipidemia LDL goal <70 04/18/2016   Hypertension    Hypertensive heart disease    Chest pain 11/16/2015   Type 2 diabetes mellitus with circulatory disorder, with long-term current use of insulin ([place]) 11/16/2015   Essential hypertension 11/16/2015   Thrombocytopenia ([place]) 11/16/2015   Leukopenia 11/16/2015   Hyponatremia 11/16/2015   Chronic combined systolic and diastolic CHF (congestive heart failure) ([place]) 11/16/2015   CAD in native artery    Brainstem infarct, acute ([place]) 09/17/2015   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 [place], [name], [address] Phone: [phone]   Fax:  [phone]  Name: Taytum Wheller MRN: [id number] Date of Birth: Nov 05, 1945

## 2021-01-22 DIAGNOSIS — M25511 Pain in right shoulder: Secondary | ICD-10-CM | POA: Diagnosis not present

## 2021-02-16 DIAGNOSIS — Z23 Encounter for immunization: Secondary | ICD-10-CM | POA: Diagnosis not present

## 2021-02-19 ENCOUNTER — Ambulatory Visit: Payer: Medicare Other

## 2021-02-20 DIAGNOSIS — M25511 Pain in right shoulder: Secondary | ICD-10-CM | POA: Diagnosis not present

## 2021-02-20 DIAGNOSIS — S42201P Unspecified fracture of upper end of right humerus, subsequent encounter for fracture with malunion: Secondary | ICD-10-CM | POA: Diagnosis not present

## 2021-03-06 ENCOUNTER — Other Ambulatory Visit (HOSPITAL_BASED_OUTPATIENT_CLINIC_OR_DEPARTMENT_OTHER): Payer: Self-pay

## 2021-03-06 ENCOUNTER — Ambulatory Visit: Payer: Medicare Other | Attending: Internal Medicine

## 2021-03-06 DIAGNOSIS — Z23 Encounter for immunization: Secondary | ICD-10-CM

## 2021-03-06 MED ORDER — PFIZER COVID-19 VAC BIVALENT 30 MCG/0.3ML IM SUSP
INTRAMUSCULAR | 0 refills | Status: DC
  Filled 2021-03-06: qty 0.3, 1d supply, fill #0

## 2021-03-06 NOTE — Progress Notes (Signed)
   Covid-19 Vaccination Clinic  Name:  Jose Beck    MRN: [id number] DOB: [birth year]  03/06/2021  Mr. Jose Beck was observed post Covid-19 immunization for 15 minutes without incident. He was provided with Vaccine Information Sheet and instruction to access the V-Safe system.   Mr. Jose Beck was instructed to call 911 with any severe reactions post vaccine: Difficulty breathing  Swelling of face and throat  A fast heartbeat  A bad rash all over body  Dizziness and weakness   Immunizations Administered     Name Date Dose VIS Date Route   Pfizer Covid-19 Vaccine Bivalent Booster 03/06/2021  2:28 PM 0.3 mL 01/09/2021 Intramuscular   Manufacturer: [place]   Lot: [id number]   [place]: [phone]

## 2021-03-25 DIAGNOSIS — S42201P Unspecified fracture of upper end of right humerus, subsequent encounter for fracture with malunion: Secondary | ICD-10-CM | POA: Diagnosis not present

## 2021-04-22 DIAGNOSIS — U071 COVID-19: Secondary | ICD-10-CM | POA: Diagnosis not present

## 2021-05-20 DIAGNOSIS — E669 Obesity, unspecified: Secondary | ICD-10-CM | POA: Diagnosis not present

## 2021-05-20 DIAGNOSIS — R634 Abnormal weight loss: Secondary | ICD-10-CM | POA: Diagnosis not present

## 2021-05-20 DIAGNOSIS — R972 Elevated prostate specific antigen [PSA]: Secondary | ICD-10-CM | POA: Diagnosis not present

## 2021-05-20 DIAGNOSIS — N1831 Chronic kidney disease, stage 3a: Secondary | ICD-10-CM | POA: Diagnosis not present

## 2021-05-20 DIAGNOSIS — E1151 Type 2 diabetes mellitus with diabetic peripheral angiopathy without gangrene: Secondary | ICD-10-CM | POA: Diagnosis not present

## 2021-05-20 DIAGNOSIS — I129 Hypertensive chronic kidney disease with stage 1 through stage 4 chronic kidney disease, or unspecified chronic kidney disease: Secondary | ICD-10-CM | POA: Diagnosis not present

## 2021-05-20 DIAGNOSIS — I69959 Hemiplegia and hemiparesis following unspecified cerebrovascular disease affecting unspecified side: Secondary | ICD-10-CM | POA: Diagnosis not present

## 2021-05-21 DIAGNOSIS — R634 Abnormal weight loss: Secondary | ICD-10-CM | POA: Diagnosis not present

## 2021-06-13 DIAGNOSIS — E119 Type 2 diabetes mellitus without complications: Secondary | ICD-10-CM | POA: Diagnosis not present

## 2021-06-20 DIAGNOSIS — U071 COVID-19: Secondary | ICD-10-CM | POA: Diagnosis not present

## 2021-06-20 NOTE — Progress Notes (Signed)
Cardiology Office Note    Date:  06/25/2021   ID:  Jose, Beck 1946/01/10, MRN [id number]  PCP:  [name], MD  Cardiologist: Dr. Martinique   Chief Complaint  Patient presents with   Coronary Artery Disease     History of Present Illness:    Jose Beck is a 76 y.o. male with past medical history of CAD (s/p known occlusion of LAD and RCA by cath in 1992 with cath in 2017 showing similar findings with collateral flow noted), chronic combined systolic and diastolic CHF, HTN, HLD, Type 2 DM, GERD, and prior CVA.  He had a fall with vertebral fracture in December 2018 managed conservatively. He had some increased garbled speech in March 2019 that resolved. MRI showed multiple chronic strokes but no acute changes. Prior  MRI demonstrated a 4.6 cm AAA. Seen by Dr. Trula Slade and US showed 3.6 cm AAA. CTA June showed increase size to 5.7 cm. He subsequently underwent endovascular repair on 11/25/17.  Followed yearly. Last [place] in February 2022.  He reports he fell last June and dislocated his right shoulder. States since this was on the side of his stroke surgery was not recommended. Notes his PSA has been high. Had recent CT abd/pelvis at [facility] with no acute findings. Reports BP at home well controlled. No angina or SOB.   Past Medical History:  Diagnosis Date   Abdominal aortic aneurysm (AAA)    Aortic stenosis    a. 11/2015 Echo: EF 55-60%, Gr1 DD, mild AS.   Chronic combined systolic and diastolic CHF (congestive heart failure) ([place])    a. 07/2015: TEE showing EF of 40% b. 11/2015: Echo w/ EF 55-60%, Grade 1 DD, mild AS.   Coronary artery disease    a. patient reports 100% stenosis of LAD and RCA by cath in 1999. b. 11/2015 Cath: LM nl, LAD 100 - fills by L->L collats from D1, LCX 60d, OM1 lalrge/nl, OM2 small/nl, RCA 149m, L->R collats.EF 35-45% (55-60% by echo).   Diabetes mellitus without complication ([place])    Type II   GERD (gastroesophageal reflux disease)     Hyperlipemia    Hypertension    Hypertensive heart disease    Stroke ([place])    a. 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   Thrombocytopenia ([place]) 11/2015    Past Surgical History:  Procedure Laterality Date   ABDOMINAL AORTIC ENDOVASCULAR STENT GRAFT N/A 11/25/2017   Procedure: ABDOMINAL AORTIC ENDOVASCULAR STENT GRAFT;  Surgeon: Serafina Mitchell, MD;  Location: [facility];  Service: Vascular;  Laterality: N/A;   CARDIAC CATHETERIZATION     CARDIAC CATHETERIZATION N/A 11/16/2015   Procedure: Left Heart Cath and Coronary Angiography;  Surgeon: [name], MD;  Location: [facility];  Service: Cardiovascular;  Laterality: N/A;   MOLE REMOVAL     TONSILLECTOMY      Current Medications: Outpatient Medications Prior to Visit  Medication Sig Dispense Refill   acetaminophen (TYLENOL) 650 MG CR tablet Take 650 mg by mouth every 8 (eight) hours as needed for pain.     atorvastatin (LIPITOR) 80 MG tablet Take 1 tablet (80 mg total) by mouth daily at 6 PM. 30 tablet 6   calcium-vitamin D 250-100 MG-UNIT tablet Take 1 tablet by mouth daily. Takes in morning 600mg      carvedilol (COREG) 25 MG tablet Take 25 mg by mouth 2 (two) times daily with a meal.     clopidogrel (PLAVIX) 75 MG  tablet Take 1 tablet (75 mg total) by mouth daily. 30 tablet 1   COVID-19 mRNA bivalent vaccine, Pfizer, (PFIZER COVID-19 VAC BIVALENT) injection Inject into the muscle. 0.3 mL 0   insulin detemir (LEVEMIR) 100 UNIT/ML injection Inject 20 Units into the skin every morning.      losartan (COZAAR) 25 MG tablet Take 25 mg by mouth daily.     nitroGLYCERIN (NITROSTAT) 0.4 MG SL tablet Place 0.4 mg under the tongue every 5 (five) minutes as needed for chest pain (x 3 doses).     tacrolimus (PROTOPIC) 0.1 % ointment Apply 1 application topically daily as needed (facial scaling).     No facility-administered medications prior to visit.     Allergies:   Cheese and Pravastatin   Social History   Socioeconomic  History   Marital status: Married    Spouse name: [name]   Number of children: 0   Years of education: College   Highest education level: Not on file  Occupational History   Occupation: Retired   Tobacco Use   Smoking status: Never   Smokeless tobacco: Never  Vaping Use   Vaping Use: Never used  Substance and Sexual Activity   Alcohol use: Yes    Alcohol/week: 1.0 standard drink    Types: 1 Shots of liquor per week    Comment: cocktails every day   Drug use: No   Sexual activity: Not on file  Other Topics Concern   Not on file  Social History Narrative   Drinks coffee daily    Social Determinants of Health   Financial Resource Strain: Not on file  Food Insecurity: Not on file  Transportation Needs: Not on file  Physical Activity: Not on file  Stress: Not on file  Social Connections: Not on file     Family History:  The patient's family history includes Heart disease in his mother; Hypertension in his mother.   Review of Systems:   Please see the history of present illness.     All other systems reviewed and are otherwise negative except as noted above.   Physical Exam:    VS:  BP (!) 174/88    Pulse 66    Ht 5\' 7"  (1.702 m)    Wt 229 lb 12.8 oz (104.2 kg)    SpO2 100%    BMI 35.99 kg/m    GENERAL:  Well appearing obese WM in NAD. Walks with cane.  HEENT:  PERRL, EOMI, sclera are clear. Oropharynx is clear. NECK:  No jugular venous distention, carotid upstroke brisk and symmetric, no bruits, no thyromegaly or adenopathy LUNGS:  Clear to auscultation bilaterally CHEST:  Unremarkable HEART:  RRR,  PMI not displaced or sustained,S1 and S2 within normal limits, no S3, no S4: soft 2/6 systolic murmur LSB>> RUSB ABD:  Soft, nontender. BS +, no masses or bruits. No hepatomegaly, no splenomegaly EXT:  2 + pulses throughout, no edema, no cyanosis no clubbing SKIN:  Warm and dry.  No rashes NEURO:  Alert and oriented x 3. Cranial nerves II through XII intact. PSYCH:   Cognitively intact       Wt Readings from Last 3 Encounters:  06/25/21 229 lb 12.8 oz (104.2 kg)  11/05/20 240 lb (108.9 kg)  06/19/20 241 lb 9.6 oz (109.6 kg)     Studies/Labs Reviewed:   EKG:  EKG is not ordered today.    Recent Labs: No results found for requested labs within last 8760 hours.   Lipid Panel  Component Value Date/Time   CHOL 151 11/16/2015 0702   TRIG 57 11/16/2015 0702   HDL 46 11/16/2015 0702   CHOLHDL 3.3 11/16/2015 0702   VLDL 11 11/16/2015 0702   LDLCALC 94 11/16/2015 0702   Labs dated 01/21/17: cholesterol 144, triglycerides 69, HDL 54, LDL 76. Dated 10/07/17: A1c 5.2%.  Dated 11/26/17: plts 94K. Hgb 13.4. Chemistries normal. A1c 5.7% Dated 06/29/19: A1c 5.4%. Dated 07/26/19: cholesterol 139, triglycerides 55, HDL 59, LDL 69. Creatinine 1.1. plts 94K. Otherwise CMET, CBC, TSH normal.  Dated 11/07/19: A1c 5.4% Dated 08/06/20: cholesterol 132, triglycerides 44, HDL 64, LDL 59. A1c 5.7%. creatinine 1.1. Otherwise CMET and TSH normal. dated 05/20/21 CMET and TSH normal  Additional studies/ records that were reviewed today include:   Cardiac Catheterization: 11/2015 Mid RCA lesion, 100% stenosed. Ost 1st Diag to 1st Diag lesion, 70% stenosed. Mid LAD lesion, 100% stenosed. Dist RCA lesion, 100% stenosed. Dist Cx lesion, 60% stenosed.   Total occlusion of the proximal RCA. RCA is heavily calcified. RCA fills by collaterals from the circumflex coronary of the left system. The right coronary is large in distribution. Total occlusion of the proximal to mid LAD after the origin of the first of perforator and first diagonal. Left to left collateral supply the LAD and a second diagonal. The first diagonal contains eccentric 50-70% narrowing. Widely patent circumflex including a small ramus intermedius/first obtuse marginal, and a large branching second obtuse marginal. The distal circumflex beyond the second marginal is small and contains 50-70% narrowing. The  circumflex is the source of collaterals to the distal right coronary. Mildly depressed LV function with estimated EF in the 35 to 40% range. LVEDP is mildly elevated. The inferior wall is hypokinetic.     RECOMMENDATIONS: Images were reviewed with Dr. Martinique. The plan at this time is to continue medical therapy unless symptoms progress. Aggressive risk factor modification. Heart failure therapy as indicated.  Echocardiogram: 11/2015 Study Conclusions   - Left ventricle: The cavity size was normal. Systolic function was   normal. The estimated ejection fraction was in the range of 55%   to 60%. Wall motion was normal; there were no regional wall   motion abnormalities. There was an increased relative   contribution of atrial contraction to ventricular filling.   Doppler parameters are consistent with abnormal left ventricular   relaxation (grade 1 diastolic dysfunction). - Aortic valve: Moderately calcified annulus. Trileaflet. Moderate   diffuse thickening and calcification. There was mild stenosis.   Valve area (VTI): 2.17 cm^2. Valve area (Vmax): 1.88 cm^2. Valve   area (Vmean): 1.75 cm^2.   Assessment:    1. Coronary artery disease involving native coronary artery of native heart without angina pectoris   2. Ruptured infrarenal abdominal aortic aneurysm (AAA)   3. Essential hypertension   4. Hypercholesterolemia   5. History of stroke        Plan:   In order of problems listed above:  1. CAD - the patient has known occlusion of the LAD and RCA by cath in 1992 with repeat cath in 2017 showing similar findings with collateral flow  - he denies any chest pain or dyspnea on exertion.  - continue Plavix, BB, and statin therapy.   2. Chronic Combined Systolic and Diastolic CHF - Echo in 93/8101 showed a EF 55-60% by echo at that time.  - no evidence of volume overload.  - continue Coreg 25mg  BID and Losartan 25 mg daily.   3. HTN - BP well  controlled by home  readings.   4. HLD -  Last LDL 59.  -  Remains on high-dose Atorvastatin 80mg  daily.  5. Type 2 DM -  Followed by PCP. Last A1c 5.4%.   6. AAA -  S/p endovascular repair in July 2019. Followed by Dr Trula Slade. [place] in Feb 2022 was stable with no endoleak.  7. History of CVA.   Follow up in one year    Signed, Meekah Math Martinique, MD  06/25/2021 3:54 PM    [facility] [address] [place], [place] [address] Phone: [phone]

## 2021-06-24 ENCOUNTER — Ambulatory Visit: Payer: Medicare Other

## 2021-06-24 ENCOUNTER — Other Ambulatory Visit (HOSPITAL_COMMUNITY): Payer: Medicare Other

## 2021-06-25 ENCOUNTER — Other Ambulatory Visit: Payer: Self-pay

## 2021-06-25 ENCOUNTER — Ambulatory Visit (INDEPENDENT_AMBULATORY_CARE_PROVIDER_SITE_OTHER): Payer: Medicare Other | Admitting: Cardiology

## 2021-06-25 ENCOUNTER — Encounter: Payer: Self-pay | Admitting: Cardiology

## 2021-06-25 VITALS — BP 174/88 | HR 66 | Ht 67.0 in | Wt 229.8 lb

## 2021-06-25 DIAGNOSIS — I10 Essential (primary) hypertension: Secondary | ICD-10-CM | POA: Diagnosis not present

## 2021-06-25 DIAGNOSIS — Z8673 Personal history of transient ischemic attack (TIA), and cerebral infarction without residual deficits: Secondary | ICD-10-CM | POA: Diagnosis not present

## 2021-06-25 DIAGNOSIS — I2510 Atherosclerotic heart disease of native coronary artery without angina pectoris: Secondary | ICD-10-CM

## 2021-06-25 DIAGNOSIS — I7133 Infrarenal abdominal aortic aneurysm, ruptured: Secondary | ICD-10-CM

## 2021-06-25 DIAGNOSIS — E7800 Pure hypercholesterolemia, unspecified: Secondary | ICD-10-CM

## 2021-07-11 ENCOUNTER — Other Ambulatory Visit: Payer: Self-pay

## 2021-07-11 DIAGNOSIS — I713 Abdominal aortic aneurysm, ruptured, unspecified: Secondary | ICD-10-CM

## 2021-07-29 ENCOUNTER — Ambulatory Visit (INDEPENDENT_AMBULATORY_CARE_PROVIDER_SITE_OTHER): Payer: Medicare Other | Admitting: Physician Assistant

## 2021-07-29 ENCOUNTER — Ambulatory Visit (HOSPITAL_COMMUNITY)
Admission: RE | Admit: 2021-07-29 | Discharge: 2021-07-29 | Disposition: A | Discharge status: Home / Self Care | Payer: Medicare Other | Source: Ambulatory Visit | Attending: Surgery | Admitting: Surgery

## 2021-07-29 ENCOUNTER — Other Ambulatory Visit: Payer: Self-pay

## 2021-07-29 VITALS — BP 119/72 | HR 69

## 2021-07-29 DIAGNOSIS — I713 Abdominal aortic aneurysm, ruptured, unspecified: Secondary | ICD-10-CM

## 2021-07-29 DIAGNOSIS — Z8679 Personal history of other diseases of the circulatory system: Secondary | ICD-10-CM

## 2021-07-29 DIAGNOSIS — Z9889 Other specified postprocedural states: Secondary | ICD-10-CM

## 2021-07-29 DIAGNOSIS — I2510 Atherosclerotic heart disease of native coronary artery without angina pectoris: Secondary | ICD-10-CM

## 2021-07-29 NOTE — Progress Notes (Signed)
?Office Note  ? ? ? ?CC:  follow up ?Requesting Provider:  [name], MD ? ?HPI: Jose Beck is a 76 y.o. (26-Apr-1946) male who presents for surveillance of endovascular repair of abdominal aortic aneurysm by Dr. Trula Slade in 2019 due to a 5.7 cm AAA.  Patient has new back pain as of last September having sustained a fall.  He is ambulatory with a cane.  He is accompanied by his wife.  He denies any claudication, rest pain, or nonhealing wounds of bilateral lower extremities.  He denies tobacco use.  He is on a Plavix and statin daily. ? ? ?Past Medical History:  ?Diagnosis Date  ? Abdominal aortic aneurysm (AAA)   ? Aortic stenosis   ? a. 11/2015 Echo: EF 55-60%, Gr1 DD, mild AS.  ? Chronic combined systolic and diastolic CHF (congestive heart failure) ([place])   ? a. 07/2015: TEE showing EF of 40% b. 11/2015: Echo w/ EF 55-60%, Grade 1 DD, mild AS.  ? Coronary artery disease   ? a. patient reports 100% stenosis of LAD and RCA by cath in 1999. b. 11/2015 Cath: LM nl, LAD 100 - fills by L->L collats from D1, LCX 60d, OM1 lalrge/nl, OM2 small/nl, RCA 112m L->R collats.EF 35-45% (55-60% by echo).  ? Diabetes mellitus without complication ([facility]   ? Type II  ? GERD (gastroesophageal reflux disease)   ? Hyperlipemia   ? Hypertension   ? Hypertensive heart disease   ? Stroke ([facility]   ? a. 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  ? Thrombocytopenia ([facility] 11/2015  ? ? ?Past Surgical History:  ?Procedure Laterality Date  ? ABDOMINAL AORTIC ENDOVASCULAR STENT GRAFT N/A 11/25/2017  ? Procedure: ABDOMINAL AORTIC ENDOVASCULAR STENT GRAFT;  Surgeon: BSerafina Mitchell MD;  [facility];  Service: Vascular;  Laterality: N/A;  ? CARDIAC CATHETERIZATION    ? CARDIAC CATHETERIZATION N/A 11/16/2015  ? Procedure: Left Heart Cath and Coronary Angiography;  Surgeon: HBelva Crome MD;  [facility];  Service: Cardiovascular;  Laterality: N/A;  ? MOLE REMOVAL    ? TONSILLECTOMY    ? ? ?Social History   ? ?Socioeconomic History  ? Marital status: Married  ?  Spouse name: JBethena Roys ? Number of children: 0  ? Years of education: College  ? Highest education level: Not on file  ?Occupational History  ? Occupation: Retired   ?Tobacco Use  ? Smoking status: Never  ? Smokeless tobacco: Never  ?Vaping Use  ? Vaping Use: Never used  ?Substance and Sexual Activity  ? Alcohol use: Yes  ?  Alcohol/week: 1.0 standard drink  ?  Types: 1 Shots of liquor per week  ?  Comment: cocktails every day  ? Drug use: No  ? Sexual activity: Not on file  ?Other Topics Concern  ? Not on file  ?Social History Narrative  ? Drinks coffee daily   ? ?Social Determinants of Health  ? ?Financial Resource Strain: Not on file  ?Food Insecurity: Not on file  ?Transportation Needs: Not on file  ?Physical Activity: Not on file  ?Stress: Not on file  ?Social Connections: Not on file  ?Intimate Partner Violence: Not on file  ? ? ?Family History  ?Problem Relation Age of Onset  ? Hypertension Mother   ? Heart disease Mother   ? ? ?Current Outpatient Medications  ?Medication Sig Dispense Refill  ? acetaminophen (TYLENOL) 650 MG CR tablet Take 650 mg by mouth every 8 (eight) hours  as needed for pain.    ? atorvastatin (LIPITOR) 80 MG tablet Take 1 tablet (80 mg total) by mouth daily at 6 PM. 30 tablet 6  ? calcium-vitamin D 250-100 MG-UNIT tablet Take 1 tablet by mouth daily. Takes in morning '600mg'$     ? carvedilol (COREG) 25 MG tablet Take 25 mg by mouth 2 (two) times daily with a meal.    ? clopidogrel (PLAVIX) 75 MG tablet Take 1 tablet (75 mg total) by mouth daily. 30 tablet 1  ? COVID-19 mRNA bivalent vaccine, Pfizer, (PFIZER COVID-19 VAC BIVALENT) injection Inject into the muscle. 0.3 mL 0  ? insulin detemir (LEVEMIR) 100 UNIT/ML injection Inject 20 Units into the skin every morning.     ? losartan (COZAAR) 25 MG tablet Take 25 mg by mouth daily.    ? nitroGLYCERIN (NITROSTAT) 0.4 MG SL tablet Place 0.4 mg under the tongue every 5 (five) minutes as needed  for chest pain (x 3 doses).    ? tacrolimus (PROTOPIC) 0.1 % ointment Apply 1 application topically daily as needed (facial scaling).    ? ?No current facility-administered medications for this visit.  ? ? ?Allergies  ?Allergen Reactions  ? Cheese Nausea And Vomiting  ? Pravastatin Anxiety  ?  Anxiety attack  ? ? ? ?REVIEW OF SYSTEMS:  ? ?'[X]'$  denotes positive finding, '[ ]'$  denotes negative finding ?Cardiac  Comments:  ?Chest pain or chest pressure:    ?Shortness of breath upon exertion:    ?Short of breath when lying flat:    ?Irregular heart rhythm:    ?    ?Vascular    ?Pain in calf, thigh, or hip brought on by ambulation:    ?Pain in feet at night that wakes you up from your sleep:     ?Blood clot in your veins:    ?Leg swelling:     ?    ?Pulmonary    ?Oxygen at home:    ?Productive cough:     ?Wheezing:     ?    ?Neurologic    ?Sudden weakness in arms or legs:     ?Sudden numbness in arms or legs:     ?Sudden onset of difficulty speaking or slurred speech:    ?Temporary loss of vision in one eye:     ?Problems with dizziness:     ?    ?Gastrointestinal    ?Blood in stool:     ?Vomited blood:     ?    ?Genitourinary    ?Burning when urinating:     ?Blood in urine:    ?    ?Psychiatric    ?Major depression:     ?    ?Hematologic    ?Bleeding problems:    ?Problems with blood clotting too easily:    ?    ?Skin    ?Rashes or ulcers:    ?    ?Constitutional    ?Fever or chills:    ? ? ?PHYSICAL EXAMINATION: ? ?Vitals:  ? 07/29/21 0849  ?BP: 119/72  ?Pulse: 69  ? ? ?General:  WDWN in NAD; vital signs documented above ?Gait: Not observed ?HENT: WNL, normocephalic ?Pulmonary: normal non-labored breathing , without Rales, rhonchi,  wheezing ?Cardiac: regular HR ?Abdomen: soft, NT, no masses ?Skin: without rashes ?Extremities: without ischemic changes, without Gangrene , without cellulitis; without open wounds;  ?Musculoskeletal: no muscle wasting or atrophy  ?Neurologic: A&O X 3;  No focal weakness or paresthesias are  detected ?Psychiatric:  The pt has Normal affect. ? ? ?Non-Invasive Vascular Imaging:   ?Previous AAA sac diameter 4.37 ?Current AAA sac diameter 5.08 cm ? ? ? ?ASSESSMENT/PLAN:: 76 y.o. male here for follow up for surveillance of endovascular repair of abdominal aortic aneurysm ? ?-Previous AAA sac diameter by ultrasound was 4.37 cm 1 year ago.  Current EVAR duplex demonstrates a 5.08 cm maximum diameter.  Case was discussed with Dr. Trula Slade who recommended a CTA abdomen and pelvis in 6 months.  This was also discussed with the patient and his wife and they are agreeable.  Patient will follow-up with CTA results with Dr. Trula Slade in 6 months.  Patient's wife mentioned they are planning to see urology about an enlarged prostate.  From a vascular surgery standpoint this should not impact any management plan from urology. ? ? ?Dagoberto Ligas, PA-C ?[facility] ?[phone] ? ?Clinic MD:   Trula Slade ? ?

## 2021-08-26 DIAGNOSIS — E785 Hyperlipidemia, unspecified: Secondary | ICD-10-CM | POA: Diagnosis not present

## 2021-08-26 DIAGNOSIS — E1151 Type 2 diabetes mellitus with diabetic peripheral angiopathy without gangrene: Secondary | ICD-10-CM | POA: Diagnosis not present

## 2021-08-26 DIAGNOSIS — I10 Essential (primary) hypertension: Secondary | ICD-10-CM | POA: Diagnosis not present

## 2021-09-02 DIAGNOSIS — R972 Elevated prostate specific antigen [PSA]: Secondary | ICD-10-CM | POA: Diagnosis not present

## 2021-09-02 DIAGNOSIS — Z1331 Encounter for screening for depression: Secondary | ICD-10-CM | POA: Diagnosis not present

## 2021-09-02 DIAGNOSIS — R82998 Other abnormal findings in urine: Secondary | ICD-10-CM | POA: Diagnosis not present

## 2021-09-02 DIAGNOSIS — E1151 Type 2 diabetes mellitus with diabetic peripheral angiopathy without gangrene: Secondary | ICD-10-CM | POA: Diagnosis not present

## 2021-09-02 DIAGNOSIS — E669 Obesity, unspecified: Secondary | ICD-10-CM | POA: Diagnosis not present

## 2021-09-02 DIAGNOSIS — I129 Hypertensive chronic kidney disease with stage 1 through stage 4 chronic kidney disease, or unspecified chronic kidney disease: Secondary | ICD-10-CM | POA: Diagnosis not present

## 2021-09-02 DIAGNOSIS — I69959 Hemiplegia and hemiparesis following unspecified cerebrovascular disease affecting unspecified side: Secondary | ICD-10-CM | POA: Diagnosis not present

## 2021-09-02 DIAGNOSIS — D693 Immune thrombocytopenic purpura: Secondary | ICD-10-CM | POA: Diagnosis not present

## 2021-09-02 DIAGNOSIS — I2510 Atherosclerotic heart disease of native coronary artery without angina pectoris: Secondary | ICD-10-CM | POA: Diagnosis not present

## 2021-09-02 DIAGNOSIS — Z1339 Encounter for screening examination for other mental health and behavioral disorders: Secondary | ICD-10-CM | POA: Diagnosis not present

## 2021-09-02 DIAGNOSIS — Z0000 Encounter for general adult medical examination without abnormal findings: Secondary | ICD-10-CM | POA: Diagnosis not present

## 2021-09-02 DIAGNOSIS — E785 Hyperlipidemia, unspecified: Secondary | ICD-10-CM | POA: Diagnosis not present

## 2021-09-02 DIAGNOSIS — N1831 Chronic kidney disease, stage 3a: Secondary | ICD-10-CM | POA: Diagnosis not present

## 2021-09-02 DIAGNOSIS — Z1212 Encounter for screening for malignant neoplasm of rectum: Secondary | ICD-10-CM | POA: Diagnosis not present

## 2021-10-09 DIAGNOSIS — R972 Elevated prostate specific antigen [PSA]: Secondary | ICD-10-CM | POA: Diagnosis not present

## 2021-10-09 DIAGNOSIS — R634 Abnormal weight loss: Secondary | ICD-10-CM | POA: Diagnosis not present

## 2021-10-09 DIAGNOSIS — N4289 Other specified disorders of prostate: Secondary | ICD-10-CM | POA: Diagnosis not present

## 2021-10-21 DIAGNOSIS — R972 Elevated prostate specific antigen [PSA]: Secondary | ICD-10-CM | POA: Diagnosis not present

## 2021-10-24 DIAGNOSIS — I110 Hypertensive heart disease with heart failure: Secondary | ICD-10-CM | POA: Diagnosis not present

## 2021-10-24 DIAGNOSIS — Z6834 Body mass index (BMI) 34.0-34.9, adult: Secondary | ICD-10-CM | POA: Diagnosis not present

## 2021-10-24 DIAGNOSIS — Z8673 Personal history of transient ischemic attack (TIA), and cerebral infarction without residual deficits: Secondary | ICD-10-CM | POA: Diagnosis not present

## 2021-10-24 DIAGNOSIS — I350 Nonrheumatic aortic (valve) stenosis: Secondary | ICD-10-CM | POA: Diagnosis not present

## 2021-10-24 DIAGNOSIS — Z794 Long term (current) use of insulin: Secondary | ICD-10-CM | POA: Diagnosis not present

## 2021-10-24 DIAGNOSIS — R531 Weakness: Secondary | ICD-10-CM | POA: Diagnosis not present

## 2021-10-24 DIAGNOSIS — E785 Hyperlipidemia, unspecified: Secondary | ICD-10-CM | POA: Diagnosis not present

## 2021-10-24 DIAGNOSIS — Z8679 Personal history of other diseases of the circulatory system: Secondary | ICD-10-CM | POA: Diagnosis not present

## 2021-10-24 DIAGNOSIS — E669 Obesity, unspecified: Secondary | ICD-10-CM | POA: Diagnosis not present

## 2021-10-24 DIAGNOSIS — R39198 Other difficulties with micturition: Secondary | ICD-10-CM | POA: Diagnosis not present

## 2021-10-24 DIAGNOSIS — R471 Dysarthria and anarthria: Secondary | ICD-10-CM | POA: Diagnosis not present

## 2021-10-24 DIAGNOSIS — I5022 Chronic systolic (congestive) heart failure: Secondary | ICD-10-CM | POA: Diagnosis not present

## 2021-10-24 DIAGNOSIS — G8929 Other chronic pain: Secondary | ICD-10-CM | POA: Diagnosis not present

## 2021-10-24 DIAGNOSIS — R351 Nocturia: Secondary | ICD-10-CM | POA: Diagnosis not present

## 2021-10-24 DIAGNOSIS — I2510 Atherosclerotic heart disease of native coronary artery without angina pectoris: Secondary | ICD-10-CM | POA: Diagnosis not present

## 2021-10-24 DIAGNOSIS — K7460 Unspecified cirrhosis of liver: Secondary | ICD-10-CM | POA: Diagnosis not present

## 2021-10-24 DIAGNOSIS — C61 Malignant neoplasm of prostate: Secondary | ICD-10-CM | POA: Diagnosis not present

## 2021-10-24 DIAGNOSIS — R972 Elevated prostate specific antigen [PSA]: Secondary | ICD-10-CM | POA: Diagnosis not present

## 2021-10-24 DIAGNOSIS — E119 Type 2 diabetes mellitus without complications: Secondary | ICD-10-CM | POA: Diagnosis not present

## 2021-10-24 DIAGNOSIS — C7951 Secondary malignant neoplasm of bone: Secondary | ICD-10-CM | POA: Diagnosis not present

## 2021-11-07 DIAGNOSIS — R9389 Abnormal findings on diagnostic imaging of other specified body structures: Secondary | ICD-10-CM | POA: Diagnosis not present

## 2021-11-07 DIAGNOSIS — C786 Secondary malignant neoplasm of retroperitoneum and peritoneum: Secondary | ICD-10-CM | POA: Diagnosis not present

## 2021-11-07 DIAGNOSIS — R972 Elevated prostate specific antigen [PSA]: Secondary | ICD-10-CM | POA: Diagnosis not present

## 2021-11-07 DIAGNOSIS — C61 Malignant neoplasm of prostate: Secondary | ICD-10-CM | POA: Diagnosis not present

## 2021-11-07 DIAGNOSIS — C7951 Secondary malignant neoplasm of bone: Secondary | ICD-10-CM | POA: Diagnosis not present

## 2021-11-07 DIAGNOSIS — C775 Secondary and unspecified malignant neoplasm of intrapelvic lymph nodes: Secondary | ICD-10-CM | POA: Diagnosis not present

## 2021-11-28 DIAGNOSIS — R471 Dysarthria and anarthria: Secondary | ICD-10-CM | POA: Diagnosis not present

## 2021-11-28 DIAGNOSIS — R351 Nocturia: Secondary | ICD-10-CM | POA: Diagnosis not present

## 2021-11-28 DIAGNOSIS — Z9889 Other specified postprocedural states: Secondary | ICD-10-CM | POA: Diagnosis not present

## 2021-11-28 DIAGNOSIS — M899 Disorder of bone, unspecified: Secondary | ICD-10-CM | POA: Diagnosis not present

## 2021-11-28 DIAGNOSIS — I5022 Chronic systolic (congestive) heart failure: Secondary | ICD-10-CM | POA: Diagnosis not present

## 2021-11-28 DIAGNOSIS — E119 Type 2 diabetes mellitus without complications: Secondary | ICD-10-CM | POA: Diagnosis not present

## 2021-11-28 DIAGNOSIS — E669 Obesity, unspecified: Secondary | ICD-10-CM | POA: Diagnosis not present

## 2021-11-28 DIAGNOSIS — C61 Malignant neoplasm of prostate: Secondary | ICD-10-CM | POA: Diagnosis not present

## 2021-11-28 DIAGNOSIS — G8929 Other chronic pain: Secondary | ICD-10-CM | POA: Diagnosis not present

## 2021-11-28 DIAGNOSIS — K7460 Unspecified cirrhosis of liver: Secondary | ICD-10-CM | POA: Diagnosis not present

## 2021-11-28 DIAGNOSIS — R410 Disorientation, unspecified: Secondary | ICD-10-CM | POA: Diagnosis not present

## 2021-11-28 DIAGNOSIS — Z8673 Personal history of transient ischemic attack (TIA), and cerebral infarction without residual deficits: Secondary | ICD-10-CM | POA: Diagnosis not present

## 2021-11-28 DIAGNOSIS — R531 Weakness: Secondary | ICD-10-CM | POA: Diagnosis not present

## 2021-11-28 DIAGNOSIS — C7951 Secondary malignant neoplasm of bone: Secondary | ICD-10-CM | POA: Diagnosis not present

## 2021-11-28 DIAGNOSIS — Z794 Long term (current) use of insulin: Secondary | ICD-10-CM | POA: Diagnosis not present

## 2021-11-28 DIAGNOSIS — I110 Hypertensive heart disease with heart failure: Secondary | ICD-10-CM | POA: Diagnosis not present

## 2021-11-28 DIAGNOSIS — I350 Nonrheumatic aortic (valve) stenosis: Secondary | ICD-10-CM | POA: Diagnosis not present

## 2021-11-28 DIAGNOSIS — I2510 Atherosclerotic heart disease of native coronary artery without angina pectoris: Secondary | ICD-10-CM | POA: Diagnosis not present

## 2021-11-28 DIAGNOSIS — E785 Hyperlipidemia, unspecified: Secondary | ICD-10-CM | POA: Diagnosis not present

## 2021-11-28 DIAGNOSIS — R262 Difficulty in walking, not elsewhere classified: Secondary | ICD-10-CM | POA: Diagnosis not present

## 2021-11-28 DIAGNOSIS — Z6833 Body mass index (BMI) 33.0-33.9, adult: Secondary | ICD-10-CM | POA: Diagnosis not present

## 2021-12-19 ENCOUNTER — Emergency Department (HOSPITAL_COMMUNITY): Payer: Medicare Other

## 2021-12-19 ENCOUNTER — Inpatient Hospital Stay (HOSPITAL_COMMUNITY): Payer: Medicare Other

## 2021-12-19 ENCOUNTER — Inpatient Hospital Stay (HOSPITAL_COMMUNITY)
Admission: EM | Admit: 2021-12-19 | Discharge: 2021-12-21 | DRG: 086 | Disposition: A | Discharge status: Home Health | Payer: Medicare Other | Attending: Internal Medicine | Admitting: Internal Medicine

## 2021-12-19 ENCOUNTER — Other Ambulatory Visit: Payer: Self-pay

## 2021-12-19 DIAGNOSIS — I350 Nonrheumatic aortic (valve) stenosis: Secondary | ICD-10-CM | POA: Diagnosis not present

## 2021-12-19 DIAGNOSIS — Z23 Encounter for immunization: Secondary | ICD-10-CM

## 2021-12-19 DIAGNOSIS — E1165 Type 2 diabetes mellitus with hyperglycemia: Secondary | ICD-10-CM | POA: Diagnosis present

## 2021-12-19 DIAGNOSIS — Z79899 Other long term (current) drug therapy: Secondary | ICD-10-CM

## 2021-12-19 DIAGNOSIS — Z8249 Family history of ischemic heart disease and other diseases of the circulatory system: Secondary | ICD-10-CM

## 2021-12-19 DIAGNOSIS — C775 Secondary and unspecified malignant neoplasm of intrapelvic lymph nodes: Secondary | ICD-10-CM | POA: Diagnosis present

## 2021-12-19 DIAGNOSIS — K219 Gastro-esophageal reflux disease without esophagitis: Secondary | ICD-10-CM | POA: Diagnosis present

## 2021-12-19 DIAGNOSIS — S299XXA Unspecified injury of thorax, initial encounter: Secondary | ICD-10-CM | POA: Diagnosis not present

## 2021-12-19 DIAGNOSIS — K7460 Unspecified cirrhosis of liver: Secondary | ICD-10-CM | POA: Diagnosis not present

## 2021-12-19 DIAGNOSIS — W19XXXA Unspecified fall, initial encounter: Secondary | ICD-10-CM | POA: Diagnosis not present

## 2021-12-19 DIAGNOSIS — I714 Abdominal aortic aneurysm, without rupture, unspecified: Secondary | ICD-10-CM | POA: Diagnosis present

## 2021-12-19 DIAGNOSIS — S0511XA Contusion of eyeball and orbital tissues, right eye, initial encounter: Secondary | ICD-10-CM | POA: Diagnosis not present

## 2021-12-19 DIAGNOSIS — R410 Disorientation, unspecified: Secondary | ICD-10-CM | POA: Diagnosis not present

## 2021-12-19 DIAGNOSIS — I5042 Chronic combined systolic (congestive) and diastolic (congestive) heart failure: Secondary | ICD-10-CM | POA: Diagnosis present

## 2021-12-19 DIAGNOSIS — S066X0A Traumatic subarachnoid hemorrhage without loss of consciousness, initial encounter: Secondary | ICD-10-CM | POA: Diagnosis not present

## 2021-12-19 DIAGNOSIS — W1830XA Fall on same level, unspecified, initial encounter: Secondary | ICD-10-CM | POA: Diagnosis present

## 2021-12-19 DIAGNOSIS — C61 Malignant neoplasm of prostate: Secondary | ICD-10-CM | POA: Diagnosis present

## 2021-12-19 DIAGNOSIS — E669 Obesity, unspecified: Secondary | ICD-10-CM | POA: Diagnosis present

## 2021-12-19 DIAGNOSIS — I69351 Hemiplegia and hemiparesis following cerebral infarction affecting right dominant side: Secondary | ICD-10-CM | POA: Diagnosis not present

## 2021-12-19 DIAGNOSIS — D696 Thrombocytopenia, unspecified: Secondary | ICD-10-CM | POA: Diagnosis present

## 2021-12-19 DIAGNOSIS — I609 Nontraumatic subarachnoid hemorrhage, unspecified: Secondary | ICD-10-CM

## 2021-12-19 DIAGNOSIS — S3993XA Unspecified injury of pelvis, initial encounter: Secondary | ICD-10-CM | POA: Diagnosis not present

## 2021-12-19 DIAGNOSIS — I10 Essential (primary) hypertension: Secondary | ICD-10-CM | POA: Diagnosis not present

## 2021-12-19 DIAGNOSIS — S4291XA Fracture of right shoulder girdle, part unspecified, initial encounter for closed fracture: Secondary | ICD-10-CM | POA: Diagnosis present

## 2021-12-19 DIAGNOSIS — S065X0A Traumatic subdural hemorrhage without loss of consciousness, initial encounter: Secondary | ICD-10-CM | POA: Diagnosis present

## 2021-12-19 DIAGNOSIS — Z6835 Body mass index (BMI) 35.0-35.9, adult: Secondary | ICD-10-CM

## 2021-12-19 DIAGNOSIS — E785 Hyperlipidemia, unspecified: Secondary | ICD-10-CM | POA: Diagnosis present

## 2021-12-19 DIAGNOSIS — Z043 Encounter for examination and observation following other accident: Secondary | ICD-10-CM | POA: Diagnosis not present

## 2021-12-19 DIAGNOSIS — R402142 Coma scale, eyes open, spontaneous, at arrival to emergency department: Secondary | ICD-10-CM | POA: Diagnosis not present

## 2021-12-19 DIAGNOSIS — Y92019 Unspecified place in single-family (private) house as the place of occurrence of the external cause: Secondary | ICD-10-CM | POA: Diagnosis not present

## 2021-12-19 DIAGNOSIS — W19XXXD Unspecified fall, subsequent encounter: Principal | ICD-10-CM

## 2021-12-19 DIAGNOSIS — S42291A Other displaced fracture of upper end of right humerus, initial encounter for closed fracture: Secondary | ICD-10-CM | POA: Diagnosis not present

## 2021-12-19 DIAGNOSIS — R402252 Coma scale, best verbal response, oriented, at arrival to emergency department: Secondary | ICD-10-CM | POA: Diagnosis present

## 2021-12-19 DIAGNOSIS — I2510 Atherosclerotic heart disease of native coronary artery without angina pectoris: Secondary | ICD-10-CM | POA: Diagnosis not present

## 2021-12-19 DIAGNOSIS — I69322 Dysarthria following cerebral infarction: Secondary | ICD-10-CM

## 2021-12-19 DIAGNOSIS — R55 Syncope and collapse: Secondary | ICD-10-CM | POA: Diagnosis not present

## 2021-12-19 DIAGNOSIS — R296 Repeated falls: Secondary | ICD-10-CM | POA: Diagnosis present

## 2021-12-19 DIAGNOSIS — S065XAA Traumatic subdural hemorrhage with loss of consciousness status unknown, initial encounter: Secondary | ICD-10-CM

## 2021-12-19 DIAGNOSIS — I69320 Aphasia following cerebral infarction: Secondary | ICD-10-CM | POA: Diagnosis not present

## 2021-12-19 DIAGNOSIS — R079 Chest pain, unspecified: Secondary | ICD-10-CM | POA: Diagnosis not present

## 2021-12-19 DIAGNOSIS — E1159 Type 2 diabetes mellitus with other circulatory complications: Secondary | ICD-10-CM

## 2021-12-19 DIAGNOSIS — Z794 Long term (current) use of insulin: Secondary | ICD-10-CM

## 2021-12-19 DIAGNOSIS — R402362 Coma scale, best motor response, obeys commands, at arrival to emergency department: Secondary | ICD-10-CM | POA: Diagnosis present

## 2021-12-19 DIAGNOSIS — Z7902 Long term (current) use of antithrombotics/antiplatelets: Secondary | ICD-10-CM

## 2021-12-19 DIAGNOSIS — I69318 Other symptoms and signs involving cognitive functions following cerebral infarction: Secondary | ICD-10-CM

## 2021-12-19 DIAGNOSIS — S61511A Laceration without foreign body of right wrist, initial encounter: Secondary | ICD-10-CM | POA: Diagnosis not present

## 2021-12-19 DIAGNOSIS — I083 Combined rheumatic disorders of mitral, aortic and tricuspid valves: Secondary | ICD-10-CM | POA: Diagnosis present

## 2021-12-19 DIAGNOSIS — C7951 Secondary malignant neoplasm of bone: Secondary | ICD-10-CM | POA: Diagnosis present

## 2021-12-19 DIAGNOSIS — I110 Hypertensive heart disease with heart failure: Secondary | ICD-10-CM | POA: Diagnosis not present

## 2021-12-19 DIAGNOSIS — Z955 Presence of coronary angioplasty implant and graft: Secondary | ICD-10-CM

## 2021-12-19 DIAGNOSIS — Z888 Allergy status to other drugs, medicaments and biological substances status: Secondary | ICD-10-CM

## 2021-12-19 DIAGNOSIS — I6523 Occlusion and stenosis of bilateral carotid arteries: Secondary | ICD-10-CM | POA: Diagnosis not present

## 2021-12-19 DIAGNOSIS — Z8679 Personal history of other diseases of the circulatory system: Secondary | ICD-10-CM

## 2021-12-19 DIAGNOSIS — S42211A Unspecified displaced fracture of surgical neck of right humerus, initial encounter for closed fracture: Secondary | ICD-10-CM | POA: Diagnosis not present

## 2021-12-19 DIAGNOSIS — S20219A Contusion of unspecified front wall of thorax, initial encounter: Secondary | ICD-10-CM | POA: Diagnosis present

## 2021-12-19 DIAGNOSIS — Z91011 Allergy to milk products: Secondary | ICD-10-CM

## 2021-12-19 DIAGNOSIS — R2681 Unsteadiness on feet: Secondary | ICD-10-CM | POA: Diagnosis present

## 2021-12-19 DIAGNOSIS — S0083XA Contusion of other part of head, initial encounter: Secondary | ICD-10-CM | POA: Diagnosis not present

## 2021-12-19 DIAGNOSIS — S0003XA Contusion of scalp, initial encounter: Secondary | ICD-10-CM | POA: Diagnosis not present

## 2021-12-19 DIAGNOSIS — S066XAA Traumatic subarachnoid hemorrhage with loss of consciousness status unknown, initial encounter: Secondary | ICD-10-CM | POA: Diagnosis not present

## 2021-12-19 LAB — COMPREHENSIVE METABOLIC PANEL
ALT: 18 U/L (ref 0–44)
AST: 21 U/L (ref 15–41)
Albumin: 3.9 g/dL (ref 3.5–5.0)
Alkaline Phosphatase: 45 U/L (ref 38–126)
Anion gap: 9 (ref 5–15)
BUN: 21 mg/dL (ref 8–23)
CO2: 24 mmol/L (ref 22–32)
Calcium: 9.1 mg/dL (ref 8.9–10.3)
Chloride: 100 mmol/L (ref 98–111)
Creatinine, Ser: 1.16 mg/dL (ref 0.61–1.24)
GFR, Estimated: >60 mL/min (ref >60)
Glucose, Bld: 181 mg/dL — ABNORMAL HIGH (ref 70–99)
Potassium: 4.5 mmol/L (ref 3.5–5.1)
Sodium: 133 mmol/L — ABNORMAL LOW (ref 135–145)
Total Bilirubin: 0.9 mg/dL (ref 0.3–1.2)
Total Protein: 6.8 g/dL (ref 6.5–8.1)

## 2021-12-19 LAB — CBC WITH DIFFERENTIAL/PLATELET
Abs Immature Granulocytes: 0.02 10*3/uL (ref 0.00–0.07)
Basophils Absolute: 0 10*3/uL (ref 0.0–0.1)
Basophils Relative: 0 %
Eosinophils Absolute: 0.1 10*3/uL (ref 0.0–0.5)
Eosinophils Relative: 1 %
HCT: 38.6 % — ABNORMAL LOW (ref 39.0–52.0)
Hemoglobin: 13.3 g/dL (ref 13.0–17.0)
Immature Granulocytes: 0 %
Lymphocytes Relative: 14 %
Lymphs Abs: 0.6 10*3/uL — ABNORMAL LOW (ref 0.7–4.0)
MCH: 33.8 pg (ref 26.0–34.0)
MCHC: 34.5 g/dL (ref 30.0–36.0)
MCV: 98 fL (ref 80.0–100.0)
Monocytes Absolute: 0.5 10*3/uL (ref 0.1–1.0)
Monocytes Relative: 10 %
Neutro Abs: 3.4 10*3/uL (ref 1.7–7.7)
Neutrophils Relative %: 75 %
Platelets: 107 10*3/uL — ABNORMAL LOW (ref 150–400)
RBC: 3.94 MIL/uL — ABNORMAL LOW (ref 4.22–5.81)
RDW: 13 % (ref 11.5–15.5)
WBC: 4.6 10*3/uL (ref 4.0–10.5)
nRBC: 0 % (ref 0.0–0.2)

## 2021-12-19 LAB — HEMOGLOBIN A1C
Hgb A1c MFr Bld: 5.4 % (ref 4.8–5.6)
Mean Plasma Glucose: 108.28 mg/dL

## 2021-12-19 LAB — MAGNESIUM: Magnesium: 2 mg/dL (ref 1.7–2.4)

## 2021-12-19 LAB — PHOSPHORUS: Phosphorus: 3 mg/dL (ref 2.5–4.6)

## 2021-12-19 LAB — CBG MONITORING, ED: Glucose-Capillary: 153 mg/dL — ABNORMAL HIGH (ref 70–99)

## 2021-12-19 LAB — PROTIME-INR
INR: 1.1 (ref 0.8–1.2)
Prothrombin Time: 13.9 seconds (ref 11.4–15.2)

## 2021-12-19 LAB — TROPONIN I (HIGH SENSITIVITY): Troponin I (High Sensitivity): 5 ng/L (ref <18)

## 2021-12-19 MED ORDER — IOHEXOL 300 MG/ML  SOLN
75.0000 mL | Freq: Once | INTRAMUSCULAR | Status: AC | PRN
  Administered 2021-12-19: 75 mL via INTRAVENOUS

## 2021-12-19 MED ORDER — TACROLIMUS 0.1 % EX OINT
1.0000 | TOPICAL_OINTMENT | Freq: Every day | CUTANEOUS | Status: DC | PRN
Start: 2021-12-19 — End: 2021-12-19

## 2021-12-19 MED ORDER — ACETAMINOPHEN ER 650 MG PO TBCR: 650.0000 mg | EXTENDED_RELEASE_TABLET | Freq: Three times a day (TID) | ORAL | Status: DC | PRN

## 2021-12-19 MED ORDER — STROKE: EARLY STAGES OF RECOVERY BOOK: Freq: Once | Status: DC

## 2021-12-19 MED ORDER — CARVEDILOL 12.5 MG PO TABS
25.0000 mg | ORAL_TABLET | Freq: Two times a day (BID) | ORAL | Status: DC
  Administered 2021-12-20 – 2021-12-21 (×3): 25 mg via ORAL
  Filled 2021-12-19 (×4): qty 2

## 2021-12-19 MED ORDER — TETANUS-DIPHTH-ACELL PERTUSSIS 5-2.5-18.5 LF-MCG/0.5 IM SUSY
0.5000 mL | PREFILLED_SYRINGE | Freq: Once | INTRAMUSCULAR | Status: AC
Start: 2021-12-19 — End: 2021-12-19
  Administered 2021-12-19: 0.5 mL via INTRAMUSCULAR
  Filled 2021-12-19: qty 0.5

## 2021-12-19 MED ORDER — LOSARTAN POTASSIUM 25 MG PO TABS
25.0000 mg | ORAL_TABLET | Freq: Every day | ORAL | Status: DC
  Administered 2021-12-20 – 2021-12-21 (×2): 25 mg via ORAL
  Filled 2021-12-19 (×2): qty 1

## 2021-12-19 MED ORDER — ACETAMINOPHEN 325 MG PO TABS: 650.0000 mg | ORAL_TABLET | ORAL | Status: DC | PRN

## 2021-12-19 MED ORDER — ATORVASTATIN CALCIUM 80 MG PO TABS
80.0000 mg | ORAL_TABLET | Freq: Every day | ORAL | Status: DC
  Administered 2021-12-20: 80 mg via ORAL
  Filled 2021-12-19: qty 1

## 2021-12-19 MED ORDER — HYDRALAZINE HCL 20 MG/ML IJ SOLN
10.0000 mg | Freq: Four times a day (QID) | INTRAMUSCULAR | Status: DC | PRN
Start: 2021-12-19 — End: 2021-12-21
  Administered 2021-12-19: 10 mg via INTRAVENOUS
  Filled 2021-12-19: qty 1

## 2021-12-19 MED ORDER — NIMODIPINE 30 MG PO CAPS
30.0000 mg | ORAL_CAPSULE | ORAL | Status: DC
Start: 2021-12-19 — End: 2021-12-21
  Administered 2021-12-20 – 2021-12-21 (×9): 30 mg via ORAL
  Filled 2021-12-19 (×14): qty 1

## 2021-12-19 MED ORDER — INSULIN ASPART 100 UNIT/ML IJ SOLN
0.0000 [IU] | Freq: Three times a day (TID) | INTRAMUSCULAR | Status: DC
  Administered 2021-12-19: 3 [IU] via SUBCUTANEOUS
  Administered 2021-12-20: 2 [IU] via SUBCUTANEOUS

## 2021-12-19 MED ORDER — LEVETIRACETAM IN NACL 500 MG/100ML IV SOLN
500.0000 mg | Freq: Two times a day (BID) | INTRAVENOUS | Status: DC
  Administered 2021-12-19 – 2021-12-21 (×4): 500 mg via INTRAVENOUS
  Filled 2021-12-19 (×5): qty 100

## 2021-12-19 MED ORDER — ACETAMINOPHEN 650 MG RE SUPP: 650.0000 mg | RECTAL | Status: DC | PRN

## 2021-12-19 MED ORDER — ACETAMINOPHEN 160 MG/5ML PO SOLN: 650.0000 mg | ORAL | Status: DC | PRN

## 2021-12-19 MED ORDER — SODIUM CHLORIDE 0.9% IV SOLUTION: Freq: Once | INTRAVENOUS | Status: AC

## 2021-12-19 MED ORDER — ONDANSETRON HCL 4 MG/2ML IJ SOLN
4.0000 mg | Freq: Four times a day (QID) | INTRAMUSCULAR | Status: DC | PRN
  Administered 2021-12-19: 4 mg via INTRAVENOUS
  Filled 2021-12-19: qty 2

## 2021-12-19 MED ORDER — SENNOSIDES-DOCUSATE SODIUM 8.6-50 MG PO TABS: 1.0000 | ORAL_TABLET | Freq: Every evening | ORAL | Status: DC | PRN

## 2021-12-19 MED ORDER — MAGNESIUM SULFATE 2 GM/50ML IV SOLN
2.0000 g | Freq: Once | INTRAVENOUS | Status: AC
  Administered 2021-12-19: 2 g via INTRAVENOUS
  Filled 2021-12-19: qty 50

## 2021-12-19 MED ORDER — INSULIN DETEMIR 100 UNIT/ML ~~LOC~~ SOLN
20.0000 [IU] | Freq: Every morning | SUBCUTANEOUS | Status: DC
  Administered 2021-12-20 – 2021-12-21 (×2): 20 [IU] via SUBCUTANEOUS
  Filled 2021-12-19 (×3): qty 0.2

## 2021-12-19 NOTE — ED Notes (Signed)
Upon swallow screening patient coughed after drinking water and then began to vomit

## 2021-12-19 NOTE — ED Notes (Signed)
Pt brought to CT and XR with this RN

## 2021-12-19 NOTE — ED Notes (Signed)
Trauma Response Nurse Documentation   Jose Beck is a 76 y.o. male arriving to [facility] ED via EMS  On clopidogrel 75 mg daily. Trauma was activated as a Level 2 by ED Charge RN based on the following trauma criteria Elderly patients > 65 with head trauma on anti-coagulation (excluding ASA). Patient cleared for CT by Dr. Billy Fischer. Patient to CT with team. GCS 14.  History   Past Medical History:  Diagnosis Date   Abdominal aortic aneurysm (AAA)    Aortic stenosis    a. 11/2015 Echo: EF 55-60%, Gr1 DD, mild AS.   Chronic combined systolic and diastolic CHF (congestive heart failure) ([place])    a. 07/2015: TEE showing EF of 40% b. 11/2015: Echo w/ EF 55-60%, Grade 1 DD, mild AS.   Coronary artery disease    a. patient reports 100% stenosis of LAD and RCA by cath in 1999. b. 11/2015 Cath: LM nl, LAD 100 - fills by L->L collats from D1, LCX 60d, OM1 lalrge/nl, OM2 small/nl, RCA 135m L->R collats.EF 35-45% (55-60% by echo).   Di[place] complication ([facility] [place]    Type II   GERD (gastroesophageal reflux disease)    Hyperlipemia    Hypertension    Hypertensive heart disease    [place] ([facility] [place]    a. 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   Thrombocytopenia ([facility] 11/2015     Past Surgical History:  Procedure Laterality Date   ABDOMINAL AORTIC ENDOVASCULAR STENT GRAFT N/A 11/25/2017   Procedure: ABDOMINAL AORTIC ENDOVASCULAR STENT GRAFT;  Surgeon: BSerafina Mitchell MD;  [facility];  Service: Vascular;  Laterality: N/A;   CARDIAC CATHETERIZATION     CARDIAC CATHETERIZATION N/A 11/16/2015   Procedure: Left Heart Cath and Coronary Angiography;  Surgeon: HBelva Crome MD;  [facility];  Service: Cardiovascular;  Laterality: N/A;   MOLE REMOVAL     TONSILLECTOMY        Initial Focused Assessment (If applicable, or please see trauma documentation): - GCS 14 - PERRLA - Bilateral equal lung sounds - PIV to L FA - NSR to bigeminy back to NSR. - Large  hematoma to R forehead just above eye - skin tear to right forearm and right hand - bruising to right shin  CT's Completed:   CT Head, CT Maxillofacial, and CT C-Spine   Interventions:  - Trauma labs - CXR - Pelvic XR - R shoulder XR - R hand XR - CT head, neck and face - tdap  To be done: - transfuse platelets - Keppra IVPB - CT chest w/ contrast - Probable ortho consult - Admit   Plan for disposition:  Other Admission - trauma and neurosurgery consults  Consults completed:  Neurosurgeon paged at 1423. Brooke, [place] trauma arrived @ 1555.  Event Summary: Pt was BIB [facility] after a [facility].  He is on Plavix.  Pt has been having intermittent confusion since the fall.   Bedside handoff with ED RN BMel Almond   Handoff with Mykenzie, TRN who is taking over from 3-7p.   WClovis Cao Trauma Response RN  Please call TRN at [phone] further assistance.

## 2021-12-19 NOTE — Consult Note (Addendum)
[facility] Surgery Consult Note  Jose Beck 05/26/1945  [id number].    Requesting MD: Gareth Morgan Chief Complaint/Reason for Consult: fall  HPI:  Jose Beck is a 76 y.o. male with multiple medical issues including h/o CVA on plavix (last dose this morning), CAD, CHF, DM, HTN, HLD, and prostate cancer who presented to [facility] today via EMS as a level 2 trauma after suffering a ground level fall. Unsure if LOC. Patient does not remember the fall, or if he had any symptoms prior to falling. Wife states she was in another room when she heard a thump. She found him on the ground. Hit his head on the ground and possibly on a door handle on the way down. Per EMS patient was going from NSR to [place] and back. Complaining of pain in his head and chest wall. Denies neck pain, back pain, shoulder pain, abdominal pain, or pain in hips or legs. Worked up by EDP and found to have SAH/SDH, right humeral neck fracture.  Patient is being admitted to the medical service. Neurosurgery consulting. Trauma also asked to see in consult.   Lives at home with wife Ambulates with cane    Family History  Problem Relation Age of Onset   Hypertension Mother    Heart disease Mother     Past Medical History:  Diagnosis Date   Abdominal aortic aneurysm (AAA)    Aortic stenosis    a. 11/2015 Echo: EF 55-60%, Gr1 DD, mild AS.   Chronic combined systolic and diastolic CHF (congestive heart failure) ([place])    a. 07/2015: TEE showing EF of 40% b. 11/2015: Echo w/ EF 55-60%, Grade 1 DD, mild AS.   Coronary artery disease    a. patient reports 100% stenosis of LAD and RCA by cath in 1999. b. 11/2015 Cath: LM nl, LAD 100 - fills by L->L collats from D1, LCX 60d, OM1 lalrge/nl, OM2 small/nl, RCA 132m L->R collats.EF 35-45% (55-60% by echo).   Diabetes mellitus without complication ([place]    Type II   GERD (gastroesophageal reflux disease)    Hyperlipemia    Hypertension    Hypertensive heart  disease    [place]    a. 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   Thrombocytopenia ([facility] 11/2015    Past Surgical History:  Procedure Laterality Date   ABDOMINAL AORTIC ENDOVASCULAR STENT GRAFT N/A 11/25/2017   Procedure: ABDOMINAL AORTIC ENDOVASCULAR STENT GRAFT;  Surgeon: BSerafina Mitchell MD;  Location: [facility] OR;  Service: Vascular;  Laterality: N/A;   CARDIAC CATHETERIZATION     CARDIAC CATHETERIZATION N/A 11/16/2015   Procedure: Left Heart Cath and Coronary Angiography;  Surgeon: HBelva Crome MD;  [facility];  Service: Cardiovascular;  Laterality: N/A;   MOLE REMOVAL     TONSILLECTOMY      Social History:  reports that he has never smoked. He has never used smokeless tobacco. He reports current alcohol use of about 1.0 standard drink of alcohol per week. He reports that he does not use drugs.  Allergies:  Allergies  Allergen Reactions   Cheese Nausea And Vomiting   Pravastatin Anxiety    Anxiety attack    (Not in a hospital admission)   Prior to Admission medications   Medication Sig Start Date End Date Taking? Authorizing Provider  acetaminophen (TYLENOL) 650 MG CR tablet Take 650 mg by mouth every 8 (eight) hours as needed for pain.    [provider]  atorvastatin (  LIPITOR) 80 MG tablet Take 1 tablet (80 mg total) by mouth daily at 6 PM. 04/25/16   Martinique, Peter M, MD  calcium-vitamin D 250-100 MG-UNIT tablet Take 1 tablet by mouth daily. Takes in morning '600mg'$     [provider]  carvedilol (COREG) 25 MG tablet Take 25 mg by mouth 2 (two) times daily with a meal.    [provider]  clopidogrel (PLAVIX) 75 MG tablet Take 1 tablet (75 mg total) by mouth daily. 08/17/15   Angiulli, [name], PA-C  COVID-19 mRNA bivalent vaccine, Pfizer, (PFIZER COVID-19 VAC BIVALENT) injection Inject into the muscle. 03/06/21   [name], MD  insulin detemir (LEVEMIR) 100 UNIT/ML injection Inject 20 Units into the skin every morning.      [provider]  losartan (COZAAR) 25 MG tablet Take 25 mg by mouth daily.    [provider]  nitroGLYCERIN (NITROSTAT) 0.4 MG SL tablet Place 0.4 mg under the tongue every 5 (five) minutes as needed for chest pain (x 3 doses).    [provider]  tacrolimus (PROTOPIC) 0.1 % ointment Apply 1 application topically daily as needed (facial scaling).    [provider]    Blood pressure (!) 161/93, pulse 87, temperature 98.2 F (36.8 C), temperature source Oral, resp. rate (!) 22, SpO2 99 %. Physical Exam: General: elderly male who is laying in bed in NAD HEENT: small abrasion to right forehead with no active bleeding. Significant edema/ecchymosis in this area and right periorbital.  Sclera are noninjected.  Pupils equal and round and reactive to light. EOMs intact.  Ears and nose without any masses or lesions.  Mouth is pink and moist. Dentition fair Heart: regular, rate, and rhythm.  +murmur. Palpable radial and pedal pulses bilaterally  Lungs: CTAB, no wheezes, rhonchi, or rales noted.  Respiratory effort nonlabored on room air. Some right sided chest wall tenderness, no external signs of trauma Abd: soft, NT/ND, +BS, no masses, hernias, or organomegaly MS: no BUE/BLE edema, calves soft and nontender. Abrasion to right knee. No BLE pain with active or passive ROM Skin: warm and dry with no masses, lesions, or rashes Psych: A&Ox4 with an appropriate affect Neuro: MAEs  Results for orders placed or performed during the hospital encounter of 12/19/21 (from the past 48 hour(s))  CBC with Differential     Status: Abnormal   Collection Time: 12/19/21  1:33 PM  Result Value Ref Range   WBC 4.6 4.0 - 10.5 K/uL   RBC 3.94 (L) 4.22 - 5.81 MIL/uL   Hemoglobin 13.3 13.0 - 17.0 g/dL   HCT 38.6 (L) 39.0 - 52.0 %   MCV 98.0 80.0 - 100.0 fL   MCH 33.8 26.0 - 34.0 pg   MCHC 34.5 30.0 - 36.0 g/dL   RDW 13.0 11.5 - 15.5 %   Platelets 107 (L) 150 - 400 K/uL   nRBC 0.0  0.0 - 0.2 %   Neutrophils Relative % 75 %   Neutro Abs 3.4 1.7 - 7.7 K/uL   Lymphocytes Relative 14 %   Lymphs Abs 0.6 (L) 0.7 - 4.0 K/uL   Monocytes Relative 10 %   Monocytes Absolute 0.5 0.1 - 1.0 K/uL   Eosinophils Relative 1 %   Eosinophils Absolute 0.1 0.0 - 0.5 K/uL   Basophils Relative 0 %   Basophils Absolute 0.0 0.0 - 0.1 K/uL   Immature Granulocytes 0 %   Abs Immature Granulocytes 0.02 0.00 - 0.07 K/uL    Comment:  Performed at [facility], [place] [address], [place], [place] [address]  Comprehensive metabolic panel     Status: Abnormal   Collection Time: 12/19/21  1:33 PM  Result Value Ref Range   Sodium 133 (L) 135 - 145 mmol/L   Potassium 4.5 3.5 - 5.1 mmol/L   Chloride 100 98 - 111 mmol/L   CO2 24 22 - 32 mmol/L   Glucose, Bld 181 (H) 70 - 99 mg/dL    Comment: Glucose reference range applies only to samples taken after fasting for at least 8 hours.   BUN 21 8 - 23 mg/dL   Creatinine, Ser 1.16 0.61 - 1.24 mg/dL   Calcium 9.1 8.9 - 10.3 mg/dL   Total Protein 6.8 6.5 - 8.1 g/dL   Albumin 3.9 3.5 - 5.0 g/dL   AST 21 15 - 41 U/L   ALT 18 0 - 44 U/L   Alkaline Phosphatase 45 38 - 126 U/L   Total Bilirubin 0.9 0.3 - 1.2 mg/dL   GFR, Estimated >60 >60 mL/min    Comment: (NOTE) Calculated using the CKD-EPI Creatinine Equation (2021)    Anion gap 9 5 - 15    Comment: Performed at [facility] [address] [place], [place] [address]  Protime-INR     Status: None   Collection Time: 12/19/21  1:33 PM  Result Value Ref Range   Prothrombin Time 13.9 11.4 - 15.2 seconds   INR 1.1 0.8 - 1.2    Comment: (NOTE) INR goal varies based on device and disease states. Performed at [facility], [place] [address], [place], [place] [address]   Troponin I (High Sensitivity)     Status: None   Collection Time: 12/19/21  1:33 PM  Result Value Ref Range   Troponin I (High Sensitivity) 5 <18 ng/L    Comment: (NOTE) Elevated high sensitivity troponin I (hsTnI) values  and significant  changes across serial measurements may suggest ACS but many other  chronic and acute conditions are known to elevate hsTnI results.  Refer to the "Links" section for chest pain algorithms and additional  guidance. Performed at [facility], [place] [address] [place], [place] [address]    DG Forearm Right  Result Date: 12/19/2021 CLINICAL DATA:  Patient on blood thinners. Level III fall. Contusion on the RIGHT side of the forehead. EXAM: RIGHT FOREARM - 2 VIEW COMPARISON:  None Available. FINDINGS: There is no evidence of fracture or other focal bone lesions. Soft tissues are unremarkable. IMPRESSION: Negative. Electronically Signed   By: Nolon Nations M.D.   On: 12/19/2021 14:47   DG Hand Complete Right  Result Date: 12/19/2021 CLINICAL DATA:  Patient on blood thinners. Level III fall. Contusion on the RIGHT side of the forehead. EXAM: RIGHT HAND - COMPLETE 3+ VIEW COMPARISON:  None Available. FINDINGS: Pulse oximeter overlies the third digit. There is no acute fracture or subluxation. No radiopaque foreign body or soft tissue gas. IMPRESSION: Negative. Electronically Signed   By: Nolon Nations M.D.   On: 12/19/2021 14:46   DG Shoulder Right  Result Date: 12/19/2021 CLINICAL DATA:  Patient on blood thinners. Level III fall. Contusion on the RIGHT side of the forehead. EXAM: RIGHT SHOULDER - 2+ VIEW COMPARISON:  Chest x-ray 11/25/2017 FINDINGS: There is an acute fracture of the RIGHT humeral neck, with overriding of fracture fragments and varus angulation. Humeral head is located in the glenoid fossa. IMPRESSION: Acute fracture of the RIGHT humeral  neck. Electronically Signed   By: Nolon Nations M.D.   On: 12/19/2021 14:45   CT Maxillofacial Wo Contrast  Result Date: 12/19/2021 CLINICAL DATA:  Facial trauma, blunt. Fall. Patient is on blood thinners. EXAM: CT MAXILLOFACIAL WITHOUT CONTRAST TECHNIQUE: Multidetector CT imaging of the maxillofacial structures was  performed. Multiplanar CT image reconstructions were also generated. RADIATION DOSE REDUCTION: This exam was performed according to the departmental dose-optimization program which includes automated exposure control, adjustment of the mA and/or kV according to patient size and/or use of iterative reconstruction technique. COMPARISON:  No acute fractures are present. Mandible is intact and located. FINDINGS: Osseous: No acute fracture is present. The mandible is intact and located. Orbits: Bilateral lens replacements are noted. Globes and orbits are otherwise unremarkable. Sinuses: Mucosal thickening is present the anterior ethmoid air cells and inferior frontal sinuses without fluid levels. Soft tissues: Large right periorbital and temporal hematoma is present without underlying fracture or radiopaque foreign body. No other focal soft tissue injury is present. Limited intracranial: Subarachnoid and subdural blood is described in detail on CT head report of the same day. IMPRESSION: 1. Large right periorbital and temporal hematoma without underlying fracture or radiopaque foreign body. 2. No other acute trauma to the face. 3. Subarachnoid and subdural blood is described in detail on CT head report of the same day. Critical Value/emergent results were called by telephone at the time of interpretation on 12/19/2021 at 2:19pm to provider [facility] , who verbally acknowledged these results. Electronically Signed   By: San Morelle M.D.   On: 12/19/2021 14:44   DG Wrist Complete Right  Result Date: 12/19/2021 CLINICAL DATA:  Patient on blood thinners. Level III fall. Contusion on the RIGHT side of the forehead. EXAM: RIGHT WRIST - COMPLETE 3+ VIEW COMPARISON:  None Available. FINDINGS: There is no evidence of fracture or dislocation. There is no evidence of arthropathy or other focal bone abnormality. Soft tissues are unremarkable. IMPRESSION: Negative. Electronically Signed   By: Nolon Nations M.D.    On: 12/19/2021 14:43   CT Cervical Spine Wo Contrast  Result Date: 12/19/2021 CLINICAL DATA:  Fall.  Patient is on blood thinners. EXAM: CT CERVICAL SPINE WITHOUT CONTRAST TECHNIQUE: Multidetector CT imaging of the cervical spine was performed without intravenous contrast. Multiplanar CT image reconstructions were also generated. RADIATION DOSE REDUCTION: This exam was performed according to the departmental dose-optimization program which includes automated exposure control, adjustment of the mA and/or kV according to patient size and/or use of iterative reconstruction technique. COMPARISON:  None Available. FINDINGS: Alignment: No significant listhesis is present. Cervical lordosis is preserved. Skull base and vertebrae: Craniocervical junction is within normal limits. Vertebral body heights are normal. No acute fractures are present. Ossification along the posterior longitudinal ligament is likely related to remote trauma. Soft tissues and spinal canal: No prevertebral fluid or swelling. No visible canal hematoma. Atherosclerotic calcifications are present within the carotid arteries bilaterally, left greater than right. Disc levels:  No significant osseous stenosis is evident. Upper chest: The lung apices are clear. Thoracic inlet is within normal limits. IMPRESSION: 1. No acute fracture or traumatic subluxation. 2. Ossification along the posterior longitudinal ligament is likely related to remote trauma. 3. Atherosclerosis. These results were called by telephone at the time of interpretation on 12/19/2021 at 2:19pm to provider [facility] , who verbally acknowledged these results. Electronically Signed   By: San Morelle M.D.   On: 12/19/2021 14:38   CT Head Wo Contrast  Result Date: 12/19/2021  CLINICAL DATA:  Level 2 trauma. Blunt. Fall. Patient is on blood thinners EXAM: CT HEAD WITHOUT CONTRAST TECHNIQUE: Contiguous axial images were obtained from the base of the skull through the vertex  without intravenous contrast. RADIATION DOSE REDUCTION: This exam was performed according to the departmental dose-optimization program which includes automated exposure control, adjustment of the mA and/or kV according to patient size and/or use of iterative reconstruction technique. COMPARISON:  CT head without contrast 07/21/17 FINDINGS: Brain: Acute subarachnoid hemorrhage is present the posterior right temporal lobe in more anteriorly over the right frontal lobe. A thin subdural hemorrhage is also present adjacent to the right temporal tip in the right middle cranial fossa. Remote cerebellar infarcts are again seen, right greater than left. No acute ischemic infarct is present. Moderate generalized atrophy is similar the prior exam. The ventricles are proportionate to the degree of atrophy. Vascular: Dense atherosclerotic calcifications are present within the cavernous internal carotid arteries. No hyperdense vessel is present. Skull: A large right periorbital and temporal hematoma is present. No underlying fracture is present. Calvarium is intact. No foreign body is present. No other focal extracranial soft tissue injury is present. Sinuses/Orbits: Anterior ethmoid air cells are opacified. Extensive mucosal thickening is present in the frontal sinuses bilaterally. The paranasal sinuses and mastoid air cells are otherwise clear. Bilateral lens replacements are noted. Globes and orbits are otherwise unremarkable. IMPRESSION: 1. Acute subarachnoid hemorrhage involving the posterior right temporal lobe and anterior right frontal lobe. 2. Thin subdural hemorrhage adjacent to the right temporal tip in the right middle cranial fossa. 3. Large right periorbital and temporal hematoma without underlying fracture. 4. Remote cerebellar infarcts, right greater than left. 5. Stable atrophy and white matter disease. Critical Value/emergent results were called by telephone at the time of interpretation on 12/19/2021 at 2:19 pm  to provider [facility] , who verbally acknowledged these results. Electronically Signed   By: San Morelle M.D.   On: 12/19/2021 14:34   DG Chest Portable 1 View  Result Date: 12/19/2021 CLINICAL DATA:  Trauma, fall EXAM: PORTABLE CHEST 1 VIEW COMPARISON:  11/25/2017 FINDINGS: The heart size and mediastinal contours are within normal limits. Both lungs are clear. There is deformity in the right shoulder with possible fracture or dislocation. Right shoulder is not adequately visualized for evaluation. IMPRESSION: There are no focal pulmonary infiltrates. There is no pleural effusion or pneumothorax. There is deformity in right shoulder suggesting possible fracture dislocation. Right shoulder is not included in its entirety limiting evaluation. Routine radiographs of right shoulder is recommended for further evaluation. Electronically Signed   By: Elmer Picker M.D.   On: 12/19/2021 14:03   DG Pelvis Portable  Result Date: 12/19/2021 CLINICAL DATA:  Trauma, fall EXAM: PORTABLE PELVIS 1-2 VIEWS COMPARISON:  None Available. FINDINGS: No displaced fracture or dislocation is seen. Arterial calcifications are seen in soft tissues. Vascular stents are noted in the courses of iliac arteries. IMPRESSION: No recent fracture or dislocation is seen. Electronically Signed   By: Elmer Picker M.D.   On: 12/19/2021 13:58      Assessment/Plan Fall Possible syncope - work up per medicine SAH/SDH - per NSGY, repeat CT head tonight. Keppra. Hold plavix. 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 Will obtain CT scan  ID - none VTE - SCDs FEN - IVF, NPO Foley - none  Dispo - Agree with medical admission. Neurosurgery consult. Will  need TBI team therapies.  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

## 2021-12-19 NOTE — ED Provider Notes (Signed)
  Vitals  BP (!) 178/89   Pulse 87   Resp (!) 21   SpO2 99%   Procedures  Procedures  ED Course / MDM   Clinical Course as of 12/19/21 1519  Thu Dec 19, 2021  1510 S. Level2 trauma-- GLF vs syncope with SAH and SDH. On plavix. NSGY aware, not rec plts/reversal.  '[ ]'$  plan to admit to hospitalist [BR]    Clinical Course User Index [BR] Faylene Million, MD   Medical Decision Making Amount and/or Complexity of Data Reviewed Labs: ordered. Radiology: ordered.  Risk Prescription drug management. Decision regarding hospitalization.   76 yo M PMH CVA, ataxia, T2DM, HTN, AAA presenting as Level 2 Trauma after GLF vs syncopal episode. Patient found to have SAH, SDH, and R humeral head fx.  Plan at time of my assumption of care was: -plan for admission for injuries related to GLF vs syncopal episode  When I assessed the patient, I found him to be resting comfortably in bed.  Patient denies any immediate needs.  Trauma is aware of the patient and will continue to follow along regarding his injuries.  Neurosurgery also aware of the patient, they are requesting repeat CT head in 6 hours from original.  Patient with bigeminy seen on EMS cardiac rhythm strip prior to arrival.  I believe patient will benefit from inpatient admission to hospitalist service for additional syncope and cardiac workup, including formal echo.  Patient was accepted for admission.     Faylene Million, MD 12/19/21 Darci Needle    Drenda Freeze, MD 12/20/21 [phone]

## 2021-12-19 NOTE — ED Notes (Signed)
Transported to CT 

## 2021-12-19 NOTE — H&P (Signed)
History and Physical    Jose Beck UEK:[id number] DOB: [birth year] DOA: 12/19/2021  PCP: [name], MD (Confirm with patient/family/NH records and if not entered, this has to be entered at [facility] point of entry) Patient coming from: Home  I have personally briefly reviewed patient's old medical records in [place]  Chief Complaint: I fell  HPI: Jose Beck is a 76 y.o. male with medical history significant of stroke with right-sided residual weakness and aphasia, CAD status post stenting, chronic HFrEF, HTN, mild aortic stenosis, AAA status post repair, IDDM, HLD, recently diagnosed metastatic prostate cancer, came with a fall and questionable syncope.  At baseline, patient has a chronic right arm and right leg weakness, uses a cane to ambulate with a limp on the right leg.  He had a fall before today in September 2022 when he fell down and hit his right shoulder sustaining a fracture but due to baseline dysfunction of right side, his orthopedic surgery decided to manage conservatively without surgery.  Several months ago patient had another near fall but he was able to grab chair before he hit the floor.  This morning, patient fell unwitnessed.  He denies any prodrome of lightheadedness chest pain or shortness of breath, wife in the next room heard a big thump and went to see him found patient was sitting on the floor with hand holding his head, and patient was initially confused.  Patient reported he could not remember anything about the fall, but denies any new weakness of any of the limbs, no numbness no double vision or blurry vision.  Denies any loss control of urine or bowel movement.  At baseline, patient also has a history of mild AS and he complains about episodes of shortness of breath after short distance walk more than 5 minutes recently.  Denies any chest pain and no episodes of lightheadedness or blurry vision.  EMS arrived and the telemetry strip showed  bigeminy, and blood pressure significant elevated.  ED Course: Blood pressure elevated, mentation comes back to baseline.  CT head showed SAH involving the right posterior temporal lobe and subdural hematoma of the right temporal lobe and large right-sided periorbital hematoma.  Picture reviewed by neurosurgery and recommended conservative management and repeat CT head in 6 hours.  Keppra was ordered.  Trauma scan showed questionable right shoulder fracture.  Otherwise CT cervical spine, right hand and fingers and right knee negative for fracture or dislocation.  Review of Systems: As per HPI otherwise 14 point review of systems negative.    Past Medical History:  Diagnosis Date   Abdominal aortic aneurysm (AAA)    Aortic stenosis    a. 11/2015 Echo: EF 55-60%, Gr1 DD, mild AS.   Chronic combined systolic and diastolic CHF (congestive heart failure) ([place])    a. 07/2015: TEE showing EF of 40% b. 11/2015: Echo w/ EF 55-60%, Grade 1 DD, mild AS.   Coronary artery disease    a. patient reports 100% stenosis of LAD and RCA by cath in 1999. b. 11/2015 Cath: LM nl, LAD 100 - fills by L->L collats from D1, LCX 60d, OM1 lalrge/nl, OM2 small/nl, RCA 131m L->R collats.EF 35-45% (55-60% by echo).   Diabetes mellitus without complication ([place]    Type II   GERD (gastroesophageal reflux disease)    Hyperlipemia    Hypertension    Hypertensive heart disease    Stroke ([place]    a. 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   Thrombocytopenia (  [place]) 11/2015    Past Surgical History:  Procedure Laterality Date   ABDOMINAL AORTIC ENDOVASCULAR STENT GRAFT N/A 11/25/2017   Procedure: ABDOMINAL AORTIC ENDOVASCULAR STENT GRAFT;  Surgeon: Serafina Mitchell, MD;  Location: [facility] OR;  Service: Vascular;  Laterality: N/A;   CARDIAC CATHETERIZATION     CARDIAC CATHETERIZATION N/A 11/16/2015   Procedure: Left Heart Cath and Coronary Angiography;  Surgeon: [name], MD;  Location: [facility];  Service:  Cardiovascular;  Laterality: N/A;   MOLE REMOVAL     TONSILLECTOMY       reports that he has never smoked. He has never used smokeless tobacco. He reports current alcohol use of about 1.0 standard drink of alcohol per week. He reports that he does not use drugs.  Allergies  Allergen Reactions   Cheese Nausea And Vomiting   Pravastatin Anxiety    Anxiety attack    Family History  Problem Relation Age of Onset   Hypertension Mother    Heart disease Mother      Prior to Admission medications   Medication Sig Start Date End Date Taking? Authorizing Provider  acetaminophen (TYLENOL) 650 MG CR tablet Take 650 mg by mouth every 8 (eight) hours as needed for pain.    [provider]  atorvastatin (LIPITOR) 80 MG tablet Take 1 tablet (80 mg total) by mouth daily at 6 PM. 04/25/16   [place], Peter M, MD  calcium-vitamin D 250-100 MG-UNIT tablet Take 1 tablet by mouth daily. Takes in morning '600mg'$     [provider]  carvedilol (COREG) 25 MG tablet Take 25 mg by mouth 2 (two) times daily with a meal.    [provider]  clopidogrel (PLAVIX) 75 MG tablet Take 1 tablet (75 mg total) by mouth daily. 08/17/15   Angiulli, [name], PA-C  COVID-19 mRNA bivalent vaccine, Pfizer, (PFIZER COVID-19 VAC BIVALENT) injection Inject into the muscle. 03/06/21   [name], MD  insulin detemir (LEVEMIR) 100 UNIT/ML injection Inject 20 Units into the skin every morning.     [provider]  losartan (COZAAR) 25 MG tablet Take 25 mg by mouth daily.    [provider]  nitroGLYCERIN (NITROSTAT) 0.4 MG SL tablet Place 0.4 mg under the tongue every 5 (five) minutes as needed for chest pain (x 3 doses).    [provider]  tacrolimus (PROTOPIC) 0.1 % ointment Apply 1 application topically daily as needed (facial scaling).    [provider]    Physical Exam: Vitals:   12/19/21 1545 12/19/21 1600 12/19/21 1615 12/19/21 1630  BP: (!) 168/96 (!)  170/101 (!) 180/99 (!) 174/90  Pulse: 93 94 95 94  Resp: (!) 21 (!) 27 17 (!) 26  Temp:      TempSrc:      SpO2: 98% 100% 98% 99%    Constitutional: NAD, calm, comfortable Vitals:   12/19/21 1545 12/19/21 1600 12/19/21 1615 12/19/21 1630  BP: (!) 168/96 (!) 170/101 (!) 180/99 (!) 174/90  Pulse: 93 94 95 94  Resp: (!) 21 (!) 27 17 (!) 26  Temp:      TempSrc:      SpO2: 98% 100% 98% 99%   Eyes: PERRL, lids and conjunctivae normal.  Large hematoma of right eyelid and socket ENMT: Mucous membranes are moist. Posterior pharynx clear of any exudate or lesions.Normal dentition.  Neck: normal, supple, no masses, no thyromegaly Respiratory: clear to auscultation bilaterally, no wheezing, no crackles. Normal respiratory effort. No accessory  muscle use.  Cardiovascular: Regular rate and rhythm, loud systolic murmur on heart base. No extremity edema. 2+ pedal pulses. No carotid bruits.  Abdomen: no tenderness, no masses palpated. No hepatosplenomegaly. Bowel sounds positive.  Musculoskeletal: no clubbing / cyanosis. No joint deformity upper and lower extremities. Good ROM, no contractures. Normal muscle tone.  Skin: no rashes, lesions, ulcers. No induration Neurologic: CN 2-12 grossly intact. Sensation intact, DTR normal. Strength 4/5 right upper and lower limb compared to 5/5 on the right side Psychiatric: Normal judgment and insight. Alert and oriented x 3. Normal mood.     Labs on Admission: I have personally reviewed following labs and imaging studies  CBC: Recent Labs  Lab 12/19/21 1333  WBC 4.6  NEUTROABS 3.4  HGB 13.3  HCT 38.6*  MCV 98.0  PLT 790*   Basic Metabolic Panel: Recent Labs  Lab 12/19/21 1333  NA 133*  K 4.5  CL 100  CO2 24  GLUCOSE 181*  BUN 21  CREATININE 1.16  CALCIUM 9.1   GFR: CrCl cannot be calculated (Unknown ideal weight.). Liver Function Tests: Recent Labs  Lab 12/19/21 1333  AST 21  ALT 18  ALKPHOS 45  BILITOT 0.9  PROT 6.8  ALBUMIN  3.9   No results for input(s): "LIPASE", "AMYLASE" in the last 168 hours. No results for input(s): "AMMONIA" in the last 168 hours. Coagulation Profile: Recent Labs  Lab 12/19/21 1333  INR 1.1   Cardiac Enzymes: No results for input(s): "CKTOTAL", "CKMB", "CKMBINDEX", "TROPONINI" in the last 168 hours. BNP (last 3 results) No results for input(s): "PROBNP" in the last 8760 hours. HbA1C: No results for input(s): "HGBA1C" in the last 72 hours. CBG: No results for input(s): "GLUCAP" in the last 168 hours. Lipid Profile: No results for input(s): "CHOL", "HDL", "LDLCALC", "TRIG", "CHOLHDL", "LDLDIRECT" in the last 72 hours. Thyroid Function Tests: No results for input(s): "TSH", "T4TOTAL", "FREET4", "T3FREE", "THYROIDAB" in the last 72 hours. Anemia Panel: No results for input(s): "VITAMINB12", "FOLATE", "FERRITIN", "TIBC", "IRON", "RETICCTPCT" in the last 72 hours. Urine analysis:    Component Value Date/Time   COLORURINE YELLOW 11/18/2017 1419   APPEARANCEUR CLEAR 11/18/2017 1419   LABSPEC 1.014 11/18/2017 1419   PHURINE 6.0 11/18/2017 1419   GLUCOSEU NEGATIVE 11/18/2017 1419   HGBUR NEGATIVE 11/18/2017 1419   BILIRUBINUR NEGATIVE 11/18/2017 1419   KETONESUR NEGATIVE 11/18/2017 1419   PROTEINUR NEGATIVE 11/18/2017 1419   NITRITE NEGATIVE 11/18/2017 1419   LEUKOCYTESUR NEGATIVE 11/18/2017 1419    Radiological Exams on Admission: DG Forearm Right  Result Date: 12/19/2021 CLINICAL DATA:  Patient on blood thinners. Level III fall. Contusion on the RIGHT side of the forehead. EXAM: RIGHT FOREARM - 2 VIEW COMPARISON:  None Available. FINDINGS: There is no evidence of fracture or other focal bone lesions. Soft tissues are unremarkable. IMPRESSION: Negative. Electronically Signed   By: Nolon Nations M.D.   On: 12/19/2021 14:47   DG Hand Complete Right  Result Date: 12/19/2021 CLINICAL DATA:  Patient on blood thinners. Level III fall. Contusion on the RIGHT side of the forehead.  EXAM: RIGHT HAND - COMPLETE 3+ VIEW COMPARISON:  None Available. FINDINGS: Pulse oximeter overlies the third digit. There is no acute fracture or subluxation. No radiopaque foreign body or soft tissue gas. IMPRESSION: Negative. Electronically Signed   By: Nolon Nations M.D.   On: 12/19/2021 14:46   DG Shoulder Right  Result Date: 12/19/2021 CLINICAL DATA:  Patient on blood thinners. Level III fall. Contusion on the RIGHT  side of the forehead. EXAM: RIGHT SHOULDER - 2+ VIEW COMPARISON:  Chest x-ray 11/25/2017 FINDINGS: There is an acute fracture of the RIGHT humeral neck, with overriding of fracture fragments and varus angulation. Humeral head is located in the glenoid fossa. IMPRESSION: Acute fracture of the RIGHT humeral neck. Electronically Signed   By: Nolon Nations M.D.   On: 12/19/2021 14:45   CT Maxillofacial Wo Contrast  Result Date: 12/19/2021 CLINICAL DATA:  Facial trauma, blunt. Fall. Patient is on blood thinners. EXAM: CT MAXILLOFACIAL WITHOUT CONTRAST TECHNIQUE: Multidetector CT imaging of the maxillofacial structures was performed. Multiplanar CT image reconstructions were also generated. RADIATION DOSE REDUCTION: This exam was performed according to the departmental dose-optimization program which includes automated exposure control, adjustment of the mA and/or kV according to patient size and/or use of iterative reconstruction technique. COMPARISON:  No acute fractures are present. Mandible is intact and located. FINDINGS: Osseous: No acute fracture is present. The mandible is intact and located. Orbits: Bilateral lens replacements are noted. Globes and orbits are otherwise unremarkable. Sinuses: Mucosal thickening is present the anterior ethmoid air cells and inferior frontal sinuses without fluid levels. Soft tissues: Large right periorbital and temporal hematoma is present without underlying fracture or radiopaque foreign body. No other focal soft tissue injury is present. Limited  intracranial: Subarachnoid and subdural blood is described in detail on CT head report of the same day. IMPRESSION: 1. Large right periorbital and temporal hematoma without underlying fracture or radiopaque foreign body. 2. No other acute trauma to the face. 3. Subarachnoid and subdural blood is described in detail on CT head report of the same day. Critical Value/emergent results were called by telephone at the time of interpretation on 12/19/2021 at 2:19pm to provider [facility] , who verbally acknowledged these results. Electronically Signed   By: San Morelle M.D.   On: 12/19/2021 14:44   DG Wrist Complete Right  Result Date: 12/19/2021 CLINICAL DATA:  Patient on blood thinners. Level III fall. Contusion on the RIGHT side of the forehead. EXAM: RIGHT WRIST - COMPLETE 3+ VIEW COMPARISON:  None Available. FINDINGS: There is no evidence of fracture or dislocation. There is no evidence of arthropathy or other focal bone abnormality. Soft tissues are unremarkable. IMPRESSION: Negative. Electronically Signed   By: Nolon Nations M.D.   On: 12/19/2021 14:43   CT Cervical Spine Wo Contrast  Result Date: 12/19/2021 CLINICAL DATA:  Fall.  Patient is on blood thinners. EXAM: CT CERVICAL SPINE WITHOUT CONTRAST TECHNIQUE: Multidetector CT imaging of the cervical spine was performed without intravenous contrast. Multiplanar CT image reconstructions were also generated. RADIATION DOSE REDUCTION: This exam was performed according to the departmental dose-optimization program which includes automated exposure control, adjustment of the mA and/or kV according to patient size and/or use of iterative reconstruction technique. COMPARISON:  None Available. FINDINGS: Alignment: No significant listhesis is present. Cervical lordosis is preserved. Skull base and vertebrae: Craniocervical junction is within normal limits. Vertebral body heights are normal. No acute fractures are present. Ossification along the  posterior longitudinal ligament is likely related to remote trauma. Soft tissues and spinal canal: No prevertebral fluid or swelling. No visible canal hematoma. Atherosclerotic calcifications are present within the carotid arteries bilaterally, left greater than right. Disc levels:  No significant osseous stenosis is evident. Upper chest: The lung apices are clear. Thoracic inlet is within normal limits. IMPRESSION: 1. No acute fracture or traumatic subluxation. 2. Ossification along the posterior longitudinal ligament is likely related to remote trauma. 3. Atherosclerosis.  These results were called by telephone at the time of interpretation on 12/19/2021 at 2:19pm to provider [facility] , who verbally acknowledged these results. Electronically Signed   By: San Morelle M.D.   On: 12/19/2021 14:38   CT Head Wo Contrast  Result Date: 12/19/2021 CLINICAL DATA:  Level 2 trauma. Blunt. Fall. Patient is on blood thinners EXAM: CT HEAD WITHOUT CONTRAST TECHNIQUE: Contiguous axial images were obtained from the base of the skull through the vertex without intravenous contrast. RADIATION DOSE REDUCTION: This exam was performed according to the departmental dose-optimization program which includes automated exposure control, adjustment of the mA and/or kV according to patient size and/or use of iterative reconstruction technique. COMPARISON:  CT head without contrast 07/21/17 FINDINGS: Brain: Acute subarachnoid hemorrhage is present the posterior right temporal lobe in more anteriorly over the right frontal lobe. A thin subdural hemorrhage is also present adjacent to the right temporal tip in the right middle cranial fossa. Remote cerebellar infarcts are again seen, right greater than left. No acute ischemic infarct is present. Moderate generalized atrophy is similar the prior exam. The ventricles are proportionate to the degree of atrophy. Vascular: Dense atherosclerotic calcifications are present within the  cavernous internal carotid arteries. No hyperdense vessel is present. Skull: A large right periorbital and temporal hematoma is present. No underlying fracture is present. Calvarium is intact. No foreign body is present. No other focal extracranial soft tissue injury is present. Sinuses/Orbits: Anterior ethmoid air cells are opacified. Extensive mucosal thickening is present in the frontal sinuses bilaterally. The paranasal sinuses and mastoid air cells are otherwise clear. Bilateral lens replacements are noted. Globes and orbits are otherwise unremarkable. IMPRESSION: 1. Acute subarachnoid hemorrhage involving the posterior right temporal lobe and anterior right frontal lobe. 2. Thin subdural hemorrhage adjacent to the right temporal tip in the right middle cranial fossa. 3. Large right periorbital and temporal hematoma without underlying fracture. 4. Remote cerebellar infarcts, right greater than left. 5. Stable atrophy and white matter disease. Critical Value/emergent results were called by telephone at the time of interpretation on 12/19/2021 at 2:19 pm to provider [facility] , who verbally acknowledged these results. Electronically Signed   By: San Morelle M.D.   On: 12/19/2021 14:34   DG Chest Portable 1 View  Result Date: 12/19/2021 CLINICAL DATA:  Trauma, fall EXAM: PORTABLE CHEST 1 VIEW COMPARISON:  11/25/2017 FINDINGS: The heart size and mediastinal contours are within normal limits. Both lungs are clear. There is deformity in the right shoulder with possible fracture or dislocation. Right shoulder is not adequately visualized for evaluation. IMPRESSION: There are no focal pulmonary infiltrates. There is no pleural effusion or pneumothorax. There is deformity in right shoulder suggesting possible fracture dislocation. Right shoulder is not included in its entirety limiting evaluation. Routine radiographs of right shoulder is recommended for further evaluation. Electronically Signed   By:  Elmer Picker M.D.   On: 12/19/2021 14:03   DG Pelvis Portable  Result Date: 12/19/2021 CLINICAL DATA:  Trauma, fall EXAM: PORTABLE PELVIS 1-2 VIEWS COMPARISON:  None Available. FINDINGS: No displaced fracture or dislocation is seen. Arterial calcifications are seen in soft tissues. Vascular stents are noted in the courses of iliac arteries. IMPRESSION: No recent fracture or dislocation is seen. Electronically Signed   By: Elmer Picker M.D.   On: 12/19/2021 13:58    EKG: Independently reviewed.  Sinus, frequent PVCs  Assessment/Plan Principal Problem:   SAH (subarachnoid hemorrhage) ([place])  (please populate well all problems here in  Problem List. (For example, if patient is on BP meds at home and you resume or decide to hold them, it is a problem that needs to be her. Same for CAD, COPD, HLD and so on)  Fall and acute SAH and acute subdural hematoma -No new neurodeficit, discussed with trauma surgeon at bedside, [place] and subdural hematoma likely sequelae of fall. -Frequent neurochecks -Aggressive BP control aiming at 130/80 -Start Nimodipine for [facility] to prevent SAH associated vasospasm. -As per neurosurgery recommendation, repeat CT head in 6 hours tonight. -Agreed with prophylactic Keppra -Hold Plavix. -As per trauma surgeon's recommendation, given the borderline low platelet count and patient chronically on Plavix, will give 1 unit of platelet pheresis. -PT OT evaluation when available  Question of syncope -Wife does report that patient appeared to have clearly limp on the right side of the leg and has trapped multiple times on the same rug in the same corner of the house last September and this time.  However, patient could not tell me more detail about how he fell recent concern about high risk syncope especially given the initial presentation heart strip showing new bigeminy as well as 2 episodes of nonsustained V. tach here in the ED.  Discussed with on-call cardiology who  will guide [place] on this issue. -Other etiology to cause presyncope near syncope such as CHF/AA, are discussed below.  HTN, uncontrolled -Resume home BP meds including Coreg 25 mg twice daily and losartan 25 mg daily -Add Nimodipine  IDDM with hyperglycemia -Continue home dose of Lantus 20 units daily -Sliding scale and check A1c  Question of right shoulder fracture -No significant swelling pain or tenderness of right shoulder or upper arm, trauma surgeon reviewed x-ray and the impression is the fracture on the x-ray appears to be chronic which was sustained September 2022.  History of chronic HFrEF -No recent echo, not on diuresis, clinically appears to be euvolemic, but concerns for early decompensation as patient does have decreased exercise tolerance, discussed with with cardiology. -Echocardiogram and continue current BP regimen.  History of mild AS -Euvolemic, management as above.  Recently diagnosed metastatic prostate cancer -Into patient's family, recent PET scan showed metastatic lesions in pelvic lymph nodes and 1 thoracic/cervical spine vertebral body, left eighth rib and pubic ramus.  Less concern about any of the sedations causing today's problem.  He is set up to meet with oncology to discuss about treatment plan this month.  DVT prophylaxis: SCD Code Status: Full code Family Communication: Wife at bedside Disposition Plan: Patient sick with [facility] after fall, requiring close clinical monitoring, and multiple inpatient interdisciplinary management, expect more than 2 midnight hospital stay.  Consults called: Neurosurgery, trauma surgery Admission status: Telemetry admission   Lequita Halt MD [facility] Pager [phone]  12/19/2021, 5:18 PM

## 2021-12-19 NOTE — ED Provider Notes (Signed)
[place] EMERGENCY DEPARTMENT Provider Note   CSN: [id number] Arrival date & time: 12/19/21  1324     History  Chief Complaint  Patient presents with   Level 2 FAll on thinners    Jose Beck is a 76 y.o. male with a past medical history of coronary artery disease status post known obstructive disease in the LAD and RCA with most recent cath in 2017, CHF, hypertension, hyperlipidemia, diabetes, prior CVA who presents to the emergency department after ground-level fall as a level 2 trauma.  Patient's wife notes that she had heard the patient fall in the next room over and had some found the patient lying on the ground.  She notes that the patient struck his head and was "dazed right after he fell."  Patient takes clopidogrel although no other antiplatelet or blood thinning medications.  The patient does not recall the means by which she fell.  The patient's wife notes that he was she believes it was a mechanical fall as he "always trips in the same spot and ask where he was lying down."  Patient denies preceding fevers, chills, night sweats, palpitations, chest pain or shortness of breath.  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  Upon wife speaking the patient at bedside she notes that he is at baseline although he was having a difficult time finding words earlier.  Patient is currently complaining of headache as well as mild pleuritic chest pain on the left since falling.  No history of DVT or PE.  EMS states that patient was in bigeminy in route although had not entered into V. tach or any other dysrhythmia.   HPI     Home Medications Prior to Admission medications   Medication Sig Start Date End Date Taking? Authorizing Provider  acetaminophen (TYLENOL) 650 MG CR tablet Take 650 mg by mouth every 8 (eight) hours as needed for pain.    [provider]  atorvastatin (LIPITOR)  80 MG tablet Take 1 tablet (80 mg total) by mouth daily at 6 PM. 04/25/16   Martin[place], Peter M, MD  calcium-vitamin D 250-100 MG-UNIT tablet Take 1 tablet by mouth daily. Takes in morning '600mg'$     [provider]  carvedilol (COREG) 25 MG tablet Take 25 mg by mouth 2 (two) times daily with a meal.    [provider]  clopidogrel (PLAVIX) 75 MG tablet Take 1 tablet (75 mg total) by mouth daily. 08/17/15   Angiulli, [name], PA-C  COVID-19 mRNA bivalent vaccine, Pfizer, (PFIZER COVID-19 VAC BIVALENT) injection Inject into the muscle. 03/06/21   [name], MD  insulin detemir (LEVEMIR) 100 UNIT/ML injection Inject 20 Units into the skin every morning.     [provider]  losartan (COZAAR) 25 MG tablet Take 25 mg by mouth daily.    [provider]  nitroGLYCERIN (NITROSTAT) 0.4 MG SL tablet Place 0.4 mg under the tongue every 5 (five) minutes as needed for chest pain (x 3 doses).    [provider]  tacrolimus (PROTOPIC) 0.1 % ointment Apply 1 application topically daily as needed (facial scaling).    [provider]      Allergies    Cheese and Pravastatin    Review of Systems   Review of Systems  Constitutional:  Negative for chills and fever.  HENT:  Negative for ear pain and sore throat.   Eyes:  Negative for  pain and visual disturbance.  Respiratory:  Negative for cough and shortness of breath.   Cardiovascular:  Positive for chest pain. Negative for palpitations.  Gastrointestinal:  Negative for abdominal pain and vomiting.  Genitourinary:  Negative for dysuria and hematuria.  Musculoskeletal:  Negative for arthralgias and back pain.  Skin:  Negative for color change and rash.  Neurological:  Positive for headaches. Negative for seizures and syncope.  All other systems reviewed and are negative.   Physical Exam Updated Vital Signs There were no vitals taken for this visit. Physical Exam Vitals and nursing note reviewed.   Constitutional:      General: He is not in acute distress.    Appearance: He is well-developed.     Comments: Upon patient arrival he is sitting upright awake and alert.  He is not in acute distress.  Immediately visible large hematoma to the right forehead  HENT:     Head:     Comments: Large hematoma to the patient's right forehead/temple.  Small overlying hemostatic abrasion.  Pupils equal round reactive to light bilaterally.  No nasal septal hematomas.  Midface is stable. Eyes:     Conjunctiva/sclera: Conjunctivae normal.  Cardiovascular:     Rate and Rhythm: Normal rate and regular rhythm.     Heart sounds: No murmur heard.    Comments: Regular rate and rhythm on exam.  Normal sinus rhythm on the monitor with intermittent PVCs although not in frank bigeminy as detected by EMS as above Pulmonary:     Effort: Pulmonary effort is normal. No respiratory distress.     Breath sounds: Normal breath sounds.     Comments: CTAB.  There is chest wall tenderness to the patient's left chest to correspond with pleuritic pain as above.  He does not have any unstable ribs or crepitus. Chest:     Chest wall: Tenderness present.  Abdominal:     Palpations: Abdomen is soft.     Tenderness: There is no abdominal tenderness.     Comments: Soft nondistended nontender.  No visible evidence of trauma to the abdomen upon inspection of skin.  Musculoskeletal:        General: No swelling.     Cervical back: Neck supple.  Skin:    General: Skin is warm and dry.     Capillary Refill: Capillary refill takes less than 2 seconds.     Comments: Skin tears to the patient's right wrist.  All 4 extremities are distally neurovascularly intact  Neurological:     Mental Status: He is alert.     Comments: Patient dysarthric but is able to answer orientation questions appropriately.  He is alert.  He is following commands in all 4 extremities.  Grip strength is slightly reduced on the right side consistent with his  known baseline.  Strength and sensation are otherwise intact and at baseline.  Psychiatric:        Mood and Affect: Mood normal.     ED Results / Procedures / Treatments   Labs (all labs ordered are listed, but only abnormal results are displayed) Labs Reviewed - No data to display  EKG None  Radiology No results found.  Procedures Procedures    Medications Ordered in ED Medications - No data to display  ED Course/ Medical Decision Making/ A&P Clinical Course as of 12/19/21 1600  Thu Dec 19, 2021  1510 S. Level2 trauma-- GLF vs syncope with SAH and SDH. On plavix. NSGY aware, not rec plts/reversal.  '[ ]'$   plan to admit to hospitalist [BR]    Clinical Course User Index [BR] Faylene Million, MD                           Medical Decision Making Amount and/or Complexity of Data Reviewed Independent Historian: spouse and EMS Labs: ordered. Radiology: ordered and independent interpretation performed. Decision-making details documented in ED Course.  Risk Prescription drug management.   The patient presents to the emergency department hemodynamically stable, afebrile and with baseline right hemibody deficits per wife in the setting of ground-level fall with large right-sided forehead hematoma.  Patient was a level 2 trauma activation given clopidogrel use.  Will obtain CT scan of the head, C-spine, screening chest x-ray, pelvic x-ray, plain films of the right upper extremity given trauma as above.   I have personally reviewed and interpreted the patient's EKG which was nonischemic. NSR  I have personally reviewed and interpreted the patient's CT head which was remarkable for right-sided subarachnoid hemorrhage and small subdural hematoma.  Neurosurgery consulted and they did not recommend providing platelets given patient's clopidogrel use.  Given bigeminy appreciated on EMS rhythm strip and unwitnessed fall of either mechanical or syncopal etiology, will admit the patient  to hospitalist for high risk syncope workup including echo  Right humeral fracture identified and trauma was consulted.  The care of this patient was transitioned to Dr. Harle Battiest with discussion of plan to admit to hospitalist medicine as above.          Final Clinical Impression(s) / ED Diagnoses Final diagnoses:  Fall, subsequent encounter  SAH (subarachnoid hemorrhage) ([place])  SDH (subdural hematoma) ([place])    Rx / DC Orders ED Discharge Orders     None         Levie Heritage, MD 12/19/21 1615    Gareth Morgan, MD 12/19/21 [phone]

## 2021-12-19 NOTE — Consult Note (Signed)
Cardiology Consultation:   Patient ID: Jose Beck MRN: [id number]; DOB: [birth year]  Admit date: 12/19/2021 Date of Consult: 12/19/2021  PCP:  [name], MD   [facility] Providers Cardiologist:  Peter Martinique, MD   {  Patient Profile:   Jose Beck is a 76 y.o. male with a hx of CAD (s/p known occlusion of LAD and RCA by cath in 1992 with cath in 2017 showing similar findings with collateral flow noted), chronic diastolic CHF with improved EF, HTN, HLD, Type 2 DM, GERD, CVA and falls who is being seen 12/19/2021 for the evaluation of possible syncope at the request of Dr. Roosevelt Locks.  Echo 11/2015 showed LVEF at 55-60% and mild AS.  He had a fall with vertebral fracture in December 2018 managed conservatively. He had some increased garbled speech in March 2019 that resolved. MRI showed multiple chronic strokes but no acute changes.  Prior MRI demonstrated a 4.6 cm AAA. Seen by Dr. Trula Slade and US showed 3.6 cm AAA. CTA June showed increase size to 5.7 cm. He subsequently underwent endovascular repair on 11/25/17.   Last seen by Dr. Martinique 06/2021.  No evidence of endoleak by EVAR doppler 07/2021.  History of Present Illness:   Jose Beck brought by EMS as a level 2 trauma after fall.  Patient does not remember fall.  Wife heard a thump in the next room.  She thinks patient had a mechanical fall and hit his head to door handle.  EMS was called and noted to have ventricular bigeminy.  Patient complains of chest wall pain.  Workup revealed SAH/SDH and right humeral neck fracture. Cardiology is asked for possible syncope.   Patient uses cane for ambulation at baseline.  No exercise.  No reported chest pain, shortness of breath, orthopnea, PND, dizziness, palpitation or lower extremity edema.  Patient lives with wife.   Past Medical History:  Diagnosis Date   Abdominal aortic aneurysm (AAA)    Aortic stenosis    a. 11/2015 Echo: EF 55-60%, Gr1 DD, mild AS.   Chronic  combined systolic and diastolic CHF (congestive heart failure) ([place])    a. 07/2015: TEE showing EF of 40% b. 11/2015: Echo w/ EF 55-60%, Grade 1 DD, mild AS.   Coronary artery disease    a. patient reports 100% stenosis of LAD and RCA by cath in 1999. b. 11/2015 Cath: LM nl, LAD 100 - fills by L->L collats from D1, LCX 60d, OM1 lalrge/nl, OM2 small/nl, RCA 153m L->R collats.EF 35-45% (55-60% by echo).   Diabetes mellitus without complication ([place]    Type II   GERD (gastroesophageal reflux disease)    Hyperlipemia    Hypertension    Hypertensive heart disease    [place]    a. 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   [place]    Past Surgical History:  Procedure Laterality Date   ABDOMINAL AORTIC ENDOVASCULAR STENT GRAFT N/A 11/25/2017   Procedure: ABDOMINAL AORTIC ENDOVASCULAR STENT GRAFT;  Surgeon: BSerafina Mitchell MD;  Location: [facility] OR;  Service: Vascular;  Laterality: N/A;   CARDIAC CATHETERIZATION     CARDIAC CATHETERIZATION N/A 11/16/2015   Procedure: Left Heart Cath and Coronary Angiography;  Surgeon: HBelva Crome MD;  [facility];  Service: Cardiovascular;  Laterality: N/A;   MOLE REMOVAL     TONSILLECTOMY      Inpatient Medications: Scheduled Meds:  sodium chloride   Intravenous Once   atorvastatin  80 mg Oral  q1800   carvedilol  25 mg Oral BID WC   insulin aspart  0-15 Units Subcutaneous TID WC   [START ON 12/20/2021] insulin detemir  20 Units Subcutaneous q morning   losartan  25 mg Oral Daily   Continuous Infusions:  levETIRAcetam     magnesium sulfate bolus IVPB     PRN Meds: acetaminophen **OR** acetaminophen (TYLENOL) oral liquid 160 mg/5 mL **OR** acetaminophen, acetaminophen, ondansetron (ZOFRAN) IV, senna-docusate, tacrolimus  Allergies:    Allergies  Allergen Reactions   Cheese Nausea And Vomiting   Pravastatin Anxiety    Anxiety attack    Social History:   Social History   Socioeconomic History    Marital status: Married    Spouse name: [name]   Number of children: 0   Years of education: College   Highest education level: Not on file  Occupational History   Occupation: Retired   Tobacco Use   Smoking status: Never   Smokeless tobacco: Never  Vaping Use   Vaping Use: Never used  Substance and Sexual Activity   Alcohol use: Yes    Alcohol/week: 1.0 standard drink of alcohol    Types: 1 Shots of liquor per week    Comment: cocktails every day   Drug use: No   Sexual activity: Not on file  Other Topics Concern   Not on file  Social History Narrative   Drinks coffee daily    Social Determinants of Health   Financial Resource Strain: Not on file  Food Insecurity: Not on file  Transportation Needs: Not on file  Physical Activity: Not on file  Stress: Not on file  Social Connections: Not on file  Intimate Partner Violence: Not on file    Family History:   Family History  Problem Relation Age of Onset   Hypertension Mother    Heart disease Mother      ROS:  Please see the history of present illness.  All other ROS reviewed and negative.     Physical Exam/Data:   Vitals:   12/19/21 1545 12/19/21 1600 12/19/21 1615 12/19/21 1630  BP: (!) 168/96 (!) 170/101 (!) 180/99 (!) 174/90  Pulse: 93 94 95 94  Resp: (!) 21 (!) 27 17 (!) 26  Temp:      TempSrc:      SpO2: 98% 100% 98% 99%   No intake or output data in the 24 hours ending 12/19/21 1659    06/25/2021    3:32 PM 11/05/2020    3:44 PM 06/19/2020    9:26 AM  Last 3 Weights  Weight (lbs) 229 lb 12.8 oz 240 lb 241 lb 9.6 oz  Weight (kg) 104.237 kg 108.863 kg 109.589 kg     There is no height or weight on file to calculate BMI.  General:  Well nourished, well developed, in no acute distress HEENT: Large hematoma right frontal area with associated laceration. Neck: no JVD Vascular: No carotid bruits; Distal pulses 2+ bilaterally Cardiac:  normal S1, S2; RRR; 2/6 systolic murmur left sternal border.  S2 is not  diminished. Lungs:  clear to auscultation bilaterally, no wheezing, rhonchi or rales  Abd: soft, nontender, no hepatomegaly  Ext: no edema Musculoskeletal:  No deformities, BUE and BLE strength normal and equal Skin: warm and dry  Neuro: Mild residual weakness on the right side from previous CVA. Psych:  Normal affect   EKG:  The EKG was personally reviewed and demonstrates: Normal sinus rhythm, first-degree AV block, cannot rule out septal  infarct, nonspecific ST changes.   Relevant CV Studies:  Left Heart Cath and Coronary Angiography  11/2015   Conclusion  Mid RCA lesion, 100% stenosed. Ost 1st Diag to 1st Diag lesion, 70% stenosed. Mid LAD lesion, 100% stenosed. Dist RCA lesion, 100% stenosed. Dist Cx lesion, 60% stenosed.   Total occlusion of the proximal RCA. RCA is heavily calcified. RCA fills by collaterals from the circumflex coronary of the left system. The right coronary is large in distribution. Total occlusion of the proximal to mid LAD after the origin of the first of perforator and first diagonal. Left to left collateral supply the LAD and a second diagonal. The first diagonal contains eccentric 50-70% narrowing. Widely patent circumflex including a small ramus intermedius/first obtuse marginal, and a large branching second obtuse marginal. The distal circumflex beyond the second marginal is small and contains 50-70% narrowing. The circumflex is the source of collaterals to the distal right coronary. Mildly depressed LV function with estimated EF in the 35 to 40% range. LVEDP is mildly elevated. The inferior wall is hypokinetic.     RECOMMENDATIONS:   Images were reviewed with Dr. Martinique. The plan at this time is to continue medical therapy unless symptoms progress. Aggressive risk factor modification. Heart failure therapy as indicated.  Echo 11/2015 Study Conclusions   - Left ventricle: The cavity size was normal. Systolic function was    normal. The estimated  ejection fraction was in the range of 55%    to 60%. Wall motion was normal; there were no regional wall    motion abnormalities. There was an increased relative    contribution of atrial contraction to ventricular filling.    Doppler parameters are consistent with abnormal left ventricular    relaxation (grade 1 diastolic dysfunction).  - Aortic valve: Moderately calcified annulus. Trileaflet. Moderate    diffuse thickening and calcification. There was mild stenosis.    Valve area (VTI): 2.17 cm^2. Valve area (Vmax): 1.88 cm^2. Valve    area (Vmean): 1.75 cm^2.   Laboratory Data:  High Sensitivity Troponin:   Recent Labs  Lab 12/19/21 1333  TROPONINIHS 5     Chemistry Recent Labs  Lab 12/19/21 1333  NA 133*  K 4.5  CL 100  CO2 24  GLUCOSE 181*  BUN 21  CREATININE 1.16  CALCIUM 9.1  GFRNONAA >60  ANIONGAP 9    Recent Labs  Lab 12/19/21 1333  PROT 6.8  ALBUMIN 3.9  AST 21  ALT 18  ALKPHOS 45  BILITOT 0.9   Hematology Recent Labs  Lab 12/19/21 1333  WBC 4.6  RBC 3.94*  HGB 13.3  HCT 38.6*  MCV 98.0  MCH 33.8  MCHC 34.5  RDW 13.0  PLT 107*   Radiology/Studies:  DG Forearm Right  Result Date: 12/19/2021 CLINICAL DATA:  Patient on blood thinners. Level III fall. Contusion on the RIGHT side of the forehead. EXAM: RIGHT FOREARM - 2 VIEW COMPARISON:  None Available. FINDINGS: There is no evidence of fracture or other focal bone lesions. Soft tissues are unremarkable. IMPRESSION: Negative. Electronically Signed   By: Nolon Nations M.D.   On: 12/19/2021 14:47   DG Hand Complete Right  Result Date: 12/19/2021 CLINICAL DATA:  Patient on blood thinners. Level III fall. Contusion on the RIGHT side of the forehead. EXAM: RIGHT HAND - COMPLETE 3+ VIEW COMPARISON:  None Available. FINDINGS: Pulse oximeter overlies the third digit. There is no acute fracture or subluxation. No radiopaque foreign body or soft tissue gas.  IMPRESSION: Negative. Electronically Signed    By: Nolon Nations M.D.   On: 12/19/2021 14:46   DG Shoulder Right  Result Date: 12/19/2021 CLINICAL DATA:  Patient on blood thinners. Level III fall. Contusion on the RIGHT side of the forehead. EXAM: RIGHT SHOULDER - 2+ VIEW COMPARISON:  Chest x-ray 11/25/2017 FINDINGS: There is an acute fracture of the RIGHT humeral neck, with overriding of fracture fragments and varus angulation. Humeral head is located in the glenoid fossa. IMPRESSION: Acute fracture of the RIGHT humeral neck. Electronically Signed   By: Nolon Nations M.D.   On: 12/19/2021 14:45   CT Maxillofacial Wo Contrast  Result Date: 12/19/2021 CLINICAL DATA:  Facial trauma, blunt. Fall. Patient is on blood thinners. EXAM: CT MAXILLOFACIAL WITHOUT CONTRAST TECHNIQUE: Multidetector CT imaging of the maxillofacial structures was performed. Multiplanar CT image reconstructions were also generated. RADIATION DOSE REDUCTION: This exam was performed according to the departmental dose-optimization program which includes automated exposure control, adjustment of the mA and/or kV according to patient size and/or use of iterative reconstruction technique. COMPARISON:  No acute fractures are present. Mandible is intact and located. FINDINGS: Osseous: No acute fracture is present. The mandible is intact and located. Orbits: Bilateral lens replacements are noted. Globes and orbits are otherwise unremarkable. Sinuses: Mucosal thickening is present the anterior ethmoid air cells and inferior frontal sinuses without fluid levels. Soft tissues: Large right periorbital and temporal hematoma is present without underlying fracture or radiopaque foreign body. No other focal soft tissue injury is present. Limited intracranial: Subarachnoid and subdural blood is described in detail on CT head report of the same day. IMPRESSION: 1. Large right periorbital and temporal hematoma without underlying fracture or radiopaque foreign body. 2. No other acute trauma to the  face. 3. Subarachnoid and subdural blood is described in detail on CT head report of the same day. Critical Value/emergent results were called by telephone at the time of interpretation on 12/19/2021 at 2:19pm to provider [facility] , who verbally acknowledged these results. Electronically Signed   By: San Morelle M.D.   On: 12/19/2021 14:44   DG Wrist Complete Right  Result Date: 12/19/2021 CLINICAL DATA:  Patient on blood thinners. Level III fall. Contusion on the RIGHT side of the forehead. EXAM: RIGHT WRIST - COMPLETE 3+ VIEW COMPARISON:  None Available. FINDINGS: There is no evidence of fracture or dislocation. There is no evidence of arthropathy or other focal bone abnormality. Soft tissues are unremarkable. IMPRESSION: Negative. Electronically Signed   By: Nolon Nations M.D.   On: 12/19/2021 14:43   CT Cervical Spine Wo Contrast  Result Date: 12/19/2021 CLINICAL DATA:  Fall.  Patient is on blood thinners. EXAM: CT CERVICAL SPINE WITHOUT CONTRAST TECHNIQUE: Multidetector CT imaging of the cervical spine was performed without intravenous contrast. Multiplanar CT image reconstructions were also generated. RADIATION DOSE REDUCTION: This exam was performed according to the departmental dose-optimization program which includes automated exposure control, adjustment of the mA and/or kV according to patient size and/or use of iterative reconstruction technique. COMPARISON:  None Available. FINDINGS: Alignment: No significant listhesis is present. Cervical lordosis is preserved. Skull base and vertebrae: Craniocervical junction is within normal limits. Vertebral body heights are normal. No acute fractures are present. Ossification along the posterior longitudinal ligament is likely related to remote trauma. Soft tissues and spinal canal: No prevertebral fluid or swelling. No visible canal hematoma. Atherosclerotic calcifications are present within the carotid arteries bilaterally, left greater  than right. Disc levels:  No significant  osseous stenosis is evident. Upper chest: The lung apices are clear. Thoracic inlet is within normal limits. IMPRESSION: 1. No acute fracture or traumatic subluxation. 2. Ossification along the posterior longitudinal ligament is likely related to remote trauma. 3. Atherosclerosis. These results were called by telephone at the time of interpretation on 12/19/2021 at 2:19pm to provider [facility] , who verbally acknowledged these results. Electronically Signed   By: San Morelle M.D.   On: 12/19/2021 14:38   CT Head Wo Contrast  Result Date: 12/19/2021 CLINICAL DATA:  Level 2 trauma. Blunt. Fall. Patient is on blood thinners EXAM: CT HEAD WITHOUT CONTRAST TECHNIQUE: Contiguous axial images were obtained from the base of the skull through the vertex without intravenous contrast. RADIATION DOSE REDUCTION: This exam was performed according to the departmental dose-optimization program which includes automated exposure control, adjustment of the mA and/or kV according to patient size and/or use of iterative reconstruction technique. COMPARISON:  CT head without contrast 07/21/17 FINDINGS: Brain: Acute subarachnoid hemorrhage is present the posterior right temporal lobe in more anteriorly over the right frontal lobe. A thin subdural hemorrhage is also present adjacent to the right temporal tip in the right middle cranial fossa. Remote cerebellar infarcts are again seen, right greater than left. No acute ischemic infarct is present. Moderate generalized atrophy is similar the prior exam. The ventricles are proportionate to the degree of atrophy. Vascular: Dense atherosclerotic calcifications are present within the cavernous internal carotid arteries. No hyperdense vessel is present. Skull: A large right periorbital and temporal hematoma is present. No underlying fracture is present. Calvarium is intact. No foreign body is present. No other focal extracranial soft  tissue injury is present. Sinuses/Orbits: Anterior ethmoid air cells are opacified. Extensive mucosal thickening is present in the frontal sinuses bilaterally. The paranasal sinuses and mastoid air cells are otherwise clear. Bilateral lens replacements are noted. Globes and orbits are otherwise unremarkable. IMPRESSION: 1. Acute subarachnoid hemorrhage involving the posterior right temporal lobe and anterior right frontal lobe. 2. Thin subdural hemorrhage adjacent to the right temporal tip in the right middle cranial fossa. 3. Large right periorbital and temporal hematoma without underlying fracture. 4. Remote cerebellar infarcts, right greater than left. 5. Stable atrophy and white matter disease. Critical Value/emergent results were called by telephone at the time of interpretation on 12/19/2021 at 2:19 pm to provider [facility] , who verbally acknowledged these results. Electronically Signed   By: San Morelle M.D.   On: 12/19/2021 14:34   DG Chest Portable 1 View  Result Date: 12/19/2021 CLINICAL DATA:  Trauma, fall EXAM: PORTABLE CHEST 1 VIEW COMPARISON:  11/25/2017 FINDINGS: The heart size and mediastinal contours are within normal limits. Both lungs are clear. There is deformity in the right shoulder with possible fracture or dislocation. Right shoulder is not adequately visualized for evaluation. IMPRESSION: There are no focal pulmonary infiltrates. There is no pleural effusion or pneumothorax. There is deformity in right shoulder suggesting possible fracture dislocation. Right shoulder is not included in its entirety limiting evaluation. Routine radiographs of right shoulder is recommended for further evaluation. Electronically Signed   By: Elmer Picker M.D.   On: 12/19/2021 14:03   DG Pelvis Portable  Result Date: 12/19/2021 CLINICAL DATA:  Trauma, fall EXAM: PORTABLE PELVIS 1-2 VIEWS COMPARISON:  None Available. FINDINGS: No displaced fracture or dislocation is seen. Arterial  calcifications are seen in soft tissues. Vascular stents are noted in the courses of iliac arteries. IMPRESSION: No recent fracture or dislocation is seen. Electronically Signed  By: Elmer Picker M.D.   On: 12/19/2021 13:58     Assessment and Plan:   Question syncope-it is unclear whether patient had frank syncope or had a mechanical fall.  He has had falls in the past and certainly is at risk.  Will follow on telemetry.  Repeat echocardiogram.  If LV function normal we will likely not pursue further evaluation unless he has documented syncope in the future. Aortic stenosis-mild on previous echocardiogram in 2017.  Aortic stenosis murmur noted on examination.  Will plan repeat study. Status post fall with chest wall hematoma, subarachnoid/subdural hematoma-Per trauma surgery and neurosurgery. Hypertension-continue preadmission medications and follow blood pressure. Hyperlipidemia-continue statin. Coronary artery disease-hold Plavix in setting of intracranial hemorrhage.   Risk Assessment/Risk Scores:    For questions or updates, please contact [place] Please consult [email or web address] for contact info under

## 2021-12-19 NOTE — TOC CAGE-AID Note (Signed)
Transition of Care [facility]) - CAGE-AID Screening   Patient Details  Name: Jose Beck MRN: [id number] Date of Birth: 01-18-1946  Transition of [facility]) CM/SW Contact:    Army Melia, RN Phone Number:[phone] 12/19/2021, 7:32 PM   Clinical Narrative:  Pt presents after a fall, wife provided information as patient is not providing accurate information r/t alcohol consumption. He states he does not drink - wife reports that he drinks two jiggers of liquor every night, and does not typically experience withdrawal s/s when he does not drink.   CAGE-AID Screening:    Have You Ever Felt You Ought to Cut Down on Your Drinking or Drug Use?: No Have People Annoyed You By Critizing Your Drinking Or Drug Use?: No Have You Felt Bad Or Guilty About Your Drinking Or Drug Use?: No Have You Ever Had a Drink or Used Drugs First Thing In The Morning to Steady Your Nerves or to Get Rid of a Hangover?: No CAGE-AID Score: 0  Substance Abuse Education Offered: No

## 2021-12-19 NOTE — ED Triage Notes (Signed)
Pt BIB EMS from home for a fall on Plavix. PT does not remember what caused the fall. Pt initially confused with a hematoma to the right forehead, skin tear to the right forearm and hand, bruising on the right shin and a possible finger deformity. Pt going from NSR to Bigeminy and back.   Hx Stroke   70-104 HR  20 RR 148/palp 100% RA  CBG 169

## 2021-12-19 NOTE — Progress Notes (Signed)
Trauma Event Note  Mel Almond RN called TRN to assist in transporting pt to CT for CT Chest w/ Contrast. TRN transported pt to CT at 1740 w/ no complications. TRN assisted Stefani Dama in starting 1 unit platelet transfusion at 8144 w/ no complications. Mel Almond RN to call TRN for any changes.  Last imported Vital Signs BP (!) 166/92   Pulse 96   Temp 98 F (36.7 C) (Oral)   Resp 14   SpO2 99%   Trending CBC Recent Labs    12/19/21 1333  WBC 4.6  HGB 13.3  HCT 38.6*  PLT 107*    Trending Coag's Recent Labs    12/19/21 1333  INR 1.1    Trending BMET Recent Labs    12/19/21 1333  NA 133*  K 4.5  CL 100  CO2 24  BUN 21  CREATININE 1.16  GLUCOSE 181*      Shany Marinez E Loura Pitt  Trauma Response RN  Please call TRN at [phone] for further assistance.

## 2021-12-19 NOTE — Progress Notes (Signed)
Orthopedic Tech Progress Note Patient Details:  Jose Beck 10/20/1945 [id number] Level 2 Trauma  Patient ID: Jose Beck, male   DOB: [birth year], 76 y.o.   MRN: [id number]  Jose Beck 12/19/2021, 3:20 PM

## 2021-12-19 NOTE — Progress Notes (Signed)
Trauma Event Note   TRN at bedside to round, completed CAGEAID. Neurosurgery at bedside with spouse. Patient with red raised bumps to left back, denies urticaria, SHOB, chest pain. Redness marked with skin marker, image placed in chart, see media tab. Platelet infusion completed at the time that this rash was discovered, and rash is not systemic. VS obtained, stable at this time. Neurosurgery to notify primary service.  Last imported Vital Signs BP (!) 159/100   Pulse 88   Temp 98.1 F (36.7 C) (Oral)   Resp 18   SpO2 98%   Trending CBC Recent Labs    12/19/21 1333  WBC 4.6  HGB 13.3  HCT 38.6*  PLT 107*    Trending Coag's Recent Labs    12/19/21 1333  INR 1.1    Trending BMET Recent Labs    12/19/21 1333  NA 133*  K 4.5  CL 100  CO2 24  BUN 21  CREATININE 1.16  GLUCOSE 181*      [name] O Omara Alcon  Trauma Response RN  Please call TRN at [phone] for further assistance.

## 2021-12-19 NOTE — Consult Note (Signed)
Providing Compassionate, Quality Care - Together   Reason for Consult: Traumatic subarachnoid hemorrhage Referring Physician: Dr. Ewing Schlein Kaeson Kleinert is an 76 y.o. male.  HPI: Mr. Jose Beck is a 76 year old male with a history of abdominal aortic aneurysm, congestive heart failure, type 2 diabetes mellitus, hyperlipidemia, hypertension, prostate cancer, and stroke (on Plavix). He was in his usual state of health when he suffered a ground level fall on 12/19/2021. His wife was in another room when she heard the noise. She found him on the ground. He appeared to have hit his head on the ground, but may have struck a door handle on the way down. It is unclear as to whether there was a positive loss of consciousness. The patient was brought to the [facility] emergency department via EMS. Upon arrival, the patient was complaining of head and chest wall pain. CT head revealed an acute subarachnoid hemorrhage involving the posterior right temporal lobe and anterior right frontal lobe. There is also a thin subdural hemorrhage adjacent to the right temporal tip in the right middle cranial fossa. There are remote cerebellar infarcts that are more prevalent on the right. Neurosurgery was called for further evaluation and recommendations.  During this exam, the patient's wife is at the bedside. The patient reports he's uncomfortable in the bed, but not presently in pain. His wife reports that he is able to ambulate at home with a cane. He has some right-sided weakness a mild expressive aphasia from his previous stroke. He ambulates with a cane at home.  Past Medical History:  Diagnosis Date   Abdominal aortic aneurysm (AAA)    Aortic stenosis    a. 11/2015 Echo: EF 55-60%, Gr1 DD, mild AS.   Chronic combined systolic and diastolic CHF (congestive heart failure) ([place])    a. 07/2015: TEE showing EF of 40% b. 11/2015: Echo w/ EF 55-60%, Grade 1 DD, mild AS.   Coronary artery disease    a. patient reports  100% stenosis of LAD and RCA by cath in 1999. b. 11/2015 Cath: LM nl, LAD 100 - fills by L->L collats from D1, LCX 60d, OM1 lalrge/nl, OM2 small/nl, RCA 174m L->R collats.EF 35-45% (55-60% by echo).   Diabetes mellitus without complication ([place]    Type II   GERD (gastroesophageal reflux disease)    Hyperlipemia    Hypertension    Hypertensive heart disease    [place]    a. 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   [place]    Past Surgical History:  Procedure Laterality Date   ABDOMINAL AORTIC ENDOVASCULAR STENT GRAFT N/A 11/25/2017   Procedure: ABDOMINAL AORTIC ENDOVASCULAR STENT GRAFT;  Surgeon: BSerafina Mitchell MD;  Location: [facility] OR;  Service: Vascular;  Laterality: N/A;   CARDIAC CATHETERIZATION     CARDIAC CATHETERIZATION N/A 11/16/2015   Procedure: Left Heart Cath and Coronary Angiography;  Surgeon: HBelva Crome MD;  [facility];  Service: Cardiovascular;  Laterality: N/A;   MOLE REMOVAL     TONSILLECTOMY      Family History  Problem Relation Age of Onset   Hypertension Mother    Heart disease Mother     Social History:  reports that he has never smoked. He has never used smokeless tobacco. He reports current alcohol use of about 1.0 standard drink of alcohol per week. He reports that he does not use drugs.  Allergies:  Allergies  Allergen Reactions   Cheese Nausea And  Vomiting   Pravastatin Anxiety    Anxiety attack    Medications: I have reviewed the patient's current medications.  Results for orders placed or performed during the hospital encounter of 12/19/21 (from the past 48 hour(s))  CBC with Differential     Status: Abnormal   Collection Time: 12/19/21  1:33 PM  Result Value Ref Range   WBC 4.6 4.0 - 10.5 K/uL   RBC 3.94 (L) 4.22 - 5.81 MIL/uL   Hemoglobin 13.3 13.0 - 17.0 g/dL   HCT 38.6 (L) 39.0 - 52.0 %   MCV 98.0 80.0 - 100.0 fL   MCH 33.8 26.0 - 34.0 pg   MCHC 34.5 30.0 - 36.0 g/dL   RDW 13.0 11.5 -  15.5 %   Platelets 107 (L) 150 - 400 K/uL   nRBC 0.0 0.0 - 0.2 %   Neutrophils Relative % 75 %   Neutro Abs 3.4 1.7 - 7.7 K/uL   Lymphocytes Relative 14 %   Lymphs Abs 0.6 (L) 0.7 - 4.0 K/uL   Monocytes Relative 10 %   Monocytes Absolute 0.5 0.1 - 1.0 K/uL   Eosinophils Relative 1 %   Eosinophils Absolute 0.1 0.0 - 0.5 K/uL   Basophils Relative 0 %   Basophils Absolute 0.0 0.0 - 0.1 K/uL   Immature Granulocytes 0 %   Abs Immature Granulocytes 0.02 0.00 - 0.07 K/uL    Comment: Performed at [facility], [address] [place], [place] [address]  Comprehensive metabolic panel     Status: Abnormal   Collection Time: 12/19/21  1:33 PM  Result Value Ref Range   Sodium 133 (L) 135 - 145 mmol/L   Potassium 4.5 3.5 - 5.1 mmol/L   Chloride 100 98 - 111 mmol/L   CO2 24 22 - 32 mmol/L   Glucose, Bld 181 (H) 70 - 99 mg/dL    Comment: Glucose reference range applies only to samples taken after fasting for at least 8 hours.   BUN 21 8 - 23 mg/dL   Creatinine, Ser 1.16 0.61 - 1.24 mg/dL   Calcium 9.1 8.9 - 10.3 mg/dL   Total Protein 6.8 6.5 - 8.1 g/dL   Albumin 3.9 3.5 - 5.0 g/dL   AST 21 15 - 41 U/L   ALT 18 0 - 44 U/L   Alkaline Phosphatase 45 38 - 126 U/L   Total Bilirubin 0.9 0.3 - 1.2 mg/dL   GFR, Estimated >60 >60 mL/min    Comment: (NOTE) Calculated using the CKD-EPI Creatinine Equation (2021)    Anion gap 9 5 - 15    Comment: Performed at [facility] [address], [place], [place] [address]  Protime-INR     Status: None   Collection Time: 12/19/21  1:33 PM  Result Value Ref Range   Prothrombin Time 13.9 11.4 - 15.2 seconds   INR 1.1 0.8 - 1.2    Comment: (NOTE) INR goal varies based on device and disease states. Performed at [facility], [place] [address], [place], [place] [address]   Troponin I (High Sensitivity)     Status: None   Collection Time: 12/19/21  1:33 PM  Result Value Ref Range   Troponin I (High Sensitivity) 5 <18 ng/L    Comment:  (NOTE) Elevated high sensitivity troponin I (hsTnI) values and significant  changes across serial measurements may suggest ACS but many other  chronic and acute conditions are known to elevate hsTnI results.  Refer to  the "Links" section for chest pain algorithms and additional  guidance. Performed at [facility], [place] [address], [place], [place] [address]   Hemoglobin A1c     Status: None   Collection Time: 12/19/21  1:33 PM  Result Value Ref Range   Hgb A1c MFr Bld 5.4 4.8 - 5.6 %    Comment: (NOTE) Pre diabetes:          5.7%-6.4%  Diabetes:              >6.4%  Glycemic control for   <7.0% adults with diabetes    Mean Plasma Glucose 108.28 mg/dL    Comment: Performed at [facility] [address], [place], [place] [address]  Magnesium     Status: None   Collection Time: 12/19/21  1:48 PM  Result Value Ref Range   Magnesium 2.0 1.7 - 2.4 mg/dL    Comment: Performed at [facility] [address] [place], [place] [address]  Phosphorus     Status: None   Collection Time: 12/19/21  1:48 PM  Result Value Ref Range   Phosphorus 3.0 2.5 - 4.6 mg/dL    Comment: Performed at [facility] [address] [place], [place] [address]  Prepare platelet pheresis     Status: None (Preliminary result)   Collection Time: 12/19/21  3:55 PM  Result Value Ref Range   Unit Number [id number]    Blood Component Type PSORALEN TREATED    Unit division 00    Status of Unit ISSUED    Transfusion Status      OK TO TRANSFUSE Performed at [facility] [address], [place],  [address]     CT CHEST W CONTRAST  Result Date: 12/19/2021 CLINICAL DATA:  Chest trauma, blunt fall, complaining of right sided chest pain. Fall EXAM: CT CHEST WITH CONTRAST TECHNIQUE: Multidetector CT imaging of the chest was performed during intravenous contrast administration. RADIATION DOSE REDUCTION: This exam was performed according to the departmental dose-optimization  program which includes automated exposure control, adjustment of the mA and/or kV according to patient size and/or use of iterative reconstruction technique. CONTRAST:  58m OMNIPAQUE IOHEXOL 300 MG/ML  SOLN COMPARISON:  11/02/2017 FINDINGS: Cardiovascular: Diffuse coronary artery and aortic calcifications. Heart is normal size. Aorta is normal caliber. Mediastinum/Nodes: No mediastinal, hilar, or axillary adenopathy. Trachea and esophagus are unremarkable. Thyroid unremarkable. Lungs/Pleura: Lungs are clear. No focal airspace opacities or suspicious nodules. No effusions. No pneumothorax Upper Abdomen: Cirrhotic liver. No acute abnormality. Moderate-sized hiatal hernia. Musculoskeletal: No acute bony abnormality. Chest wall soft tissues are unremarkable. IMPRESSION: No acute cardiopulmonary disease. Diffuse coronary artery disease. Moderate-sized hiatal hernia. Aortic Atherosclerosis (ICD10-I70.0). Electronically Signed   By: KRolm BaptiseM.D.   On: 12/19/2021 17:24   DG Forearm Right  Result Date: 12/19/2021 CLINICAL DATA:  Patient on blood thinners. Level III fall. Contusion on the RIGHT side of the forehead. EXAM: RIGHT FOREARM - 2 VIEW COMPARISON:  None Available. FINDINGS: There is no evidence of fracture or other focal bone lesions. Soft tissues are unremarkable. IMPRESSION: Negative. Electronically Signed   By: ENolon NationsM.D.   On: 12/19/2021 14:47   DG Hand Complete Right  Result Date: 12/19/2021 CLINICAL DATA:  Patient on blood thinners. Level III fall. Contusion on the RIGHT side of the forehead. EXAM: RIGHT HAND - COMPLETE 3+ VIEW COMPARISON:  None Available. FINDINGS: Pulse oximeter overlies the third digit. There is no acute  fracture or subluxation. No radiopaque foreign body or soft tissue gas. IMPRESSION: Negative. Electronically Signed   By: Nolon Nations M.D.   On: 12/19/2021 14:46   DG Shoulder Right  Result Date: 12/19/2021 CLINICAL DATA:  Patient on blood thinners. Level  III fall. Contusion on the RIGHT side of the forehead. EXAM: RIGHT SHOULDER - 2+ VIEW COMPARISON:  Chest x-ray 11/25/2017 FINDINGS: There is an acute fracture of the RIGHT humeral neck, with overriding of fracture fragments and varus angulation. Humeral head is located in the glenoid fossa. IMPRESSION: Acute fracture of the RIGHT humeral neck. Electronically Signed   By: Nolon Nations M.D.   On: 12/19/2021 14:45   CT Maxillofacial Wo Contrast  Result Date: 12/19/2021 CLINICAL DATA:  Facial trauma, blunt. Fall. Patient is on blood thinners. EXAM: CT MAXILLOFACIAL WITHOUT CONTRAST TECHNIQUE: Multidetector CT imaging of the maxillofacial structures was performed. Multiplanar CT image reconstructions were also generated. RADIATION DOSE REDUCTION: This exam was performed according to the departmental dose-optimization program which includes automated exposure control, adjustment of the mA and/or kV according to patient size and/or use of iterative reconstruction technique. COMPARISON:  No acute fractures are present. Mandible is intact and located. FINDINGS: Osseous: No acute fracture is present. The mandible is intact and located. Orbits: Bilateral lens replacements are noted. Globes and orbits are otherwise unremarkable. Sinuses: Mucosal thickening is present the anterior ethmoid air cells and inferior frontal sinuses without fluid levels. Soft tissues: Large right periorbital and temporal hematoma is present without underlying fracture or radiopaque foreign body. No other focal soft tissue injury is present. Limited intracranial: Subarachnoid and subdural blood is described in detail on CT head report of the same day. IMPRESSION: 1. Large right periorbital and temporal hematoma without underlying fracture or radiopaque foreign body. 2. No other acute trauma to the face. 3. Subarachnoid and subdural blood is described in detail on CT head report of the same day. Critical Value/emergent results were called by  telephone at the time of interpretation on 12/19/2021 at 2:19pm to provider [facility] , who verbally acknowledged these results. Electronically Signed   By: San Morelle M.D.   On: 12/19/2021 14:44   DG Wrist Complete Right  Result Date: 12/19/2021 CLINICAL DATA:  Patient on blood thinners. Level III fall. Contusion on the RIGHT side of the forehead. EXAM: RIGHT WRIST - COMPLETE 3+ VIEW COMPARISON:  None Available. FINDINGS: There is no evidence of fracture or dislocation. There is no evidence of arthropathy or other focal bone abnormality. Soft tissues are unremarkable. IMPRESSION: Negative. Electronically Signed   By: Nolon Nations M.D.   On: 12/19/2021 14:43   CT Cervical Spine Wo Contrast  Result Date: 12/19/2021 CLINICAL DATA:  Fall.  Patient is on blood thinners. EXAM: CT CERVICAL SPINE WITHOUT CONTRAST TECHNIQUE: Multidetector CT imaging of the cervical spine was performed without intravenous contrast. Multiplanar CT image reconstructions were also generated. RADIATION DOSE REDUCTION: This exam was performed according to the departmental dose-optimization program which includes automated exposure control, adjustment of the mA and/or kV according to patient size and/or use of iterative reconstruction technique. COMPARISON:  None Available. FINDINGS: Alignment: No significant listhesis is present. Cervical lordosis is preserved. Skull base and vertebrae: Craniocervical junction is within normal limits. Vertebral body heights are normal. No acute fractures are present. Ossification along the posterior longitudinal ligament is likely related to remote trauma. Soft tissues and spinal canal: No prevertebral fluid or swelling. No visible canal hematoma. Atherosclerotic calcifications are present within the carotid arteries  bilaterally, left greater than right. Disc levels:  No significant osseous stenosis is evident. Upper chest: The lung apices are clear. Thoracic inlet is within normal  limits. IMPRESSION: 1. No acute fracture or traumatic subluxation. 2. Ossification along the posterior longitudinal ligament is likely related to remote trauma. 3. Atherosclerosis. These results were called by telephone at the time of interpretation on 12/19/2021 at 2:19pm to provider [facility] , who verbally acknowledged these results. Electronically Signed   By: San Morelle M.D.   On: 12/19/2021 14:38   CT Head Wo Contrast  Result Date: 12/19/2021 CLINICAL DATA:  Level 2 trauma. Blunt. Fall. Patient is on blood thinners EXAM: CT HEAD WITHOUT CONTRAST TECHNIQUE: Contiguous axial images were obtained from the base of the skull through the vertex without intravenous contrast. RADIATION DOSE REDUCTION: This exam was performed according to the departmental dose-optimization program which includes automated exposure control, adjustment of the mA and/or kV according to patient size and/or use of iterative reconstruction technique. COMPARISON:  CT head without contrast 07/21/17 FINDINGS: Brain: Acute subarachnoid hemorrhage is present the posterior right temporal lobe in more anteriorly over the right frontal lobe. A thin subdural hemorrhage is also present adjacent to the right temporal tip in the right middle cranial fossa. Remote cerebellar infarcts are again seen, right greater than left. No acute ischemic infarct is present. Moderate generalized atrophy is similar the prior exam. The ventricles are proportionate to the degree of atrophy. Vascular: Dense atherosclerotic calcifications are present within the cavernous internal carotid arteries. No hyperdense vessel is present. Skull: A large right periorbital and temporal hematoma is present. No underlying fracture is present. Calvarium is intact. No foreign body is present. No other focal extracranial soft tissue injury is present. Sinuses/Orbits: Anterior ethmoid air cells are opacified. Extensive mucosal thickening is present in the frontal sinuses  bilaterally. The paranasal sinuses and mastoid air cells are otherwise clear. Bilateral lens replacements are noted. Globes and orbits are otherwise unremarkable. IMPRESSION: 1. Acute subarachnoid hemorrhage involving the posterior right temporal lobe and anterior right frontal lobe. 2. Thin subdural hemorrhage adjacent to the right temporal tip in the right middle cranial fossa. 3. Large right periorbital and temporal hematoma without underlying fracture. 4. Remote cerebellar infarcts, right greater than left. 5. Stable atrophy and white matter disease. Critical Value/emergent results were called by telephone at the time of interpretation on 12/19/2021 at 2:19 pm to provider [facility] , who verbally acknowledged these results. Electronically Signed   By: San Morelle M.D.   On: 12/19/2021 14:34   DG Chest Portable 1 View  Result Date: 12/19/2021 CLINICAL DATA:  Trauma, fall EXAM: PORTABLE CHEST 1 VIEW COMPARISON:  11/25/2017 FINDINGS: The heart size and mediastinal contours are within normal limits. Both lungs are clear. There is deformity in the right shoulder with possible fracture or dislocation. Right shoulder is not adequately visualized for evaluation. IMPRESSION: There are no focal pulmonary infiltrates. There is no pleural effusion or pneumothorax. There is deformity in right shoulder suggesting possible fracture dislocation. Right shoulder is not included in its entirety limiting evaluation. Routine radiographs of right shoulder is recommended for further evaluation. Electronically Signed   By: Elmer Picker M.D.   On: 12/19/2021 14:03   DG Pelvis Portable  Result Date: 12/19/2021 CLINICAL DATA:  Trauma, fall EXAM: PORTABLE PELVIS 1-2 VIEWS COMPARISON:  None Available. FINDINGS: No displaced fracture or dislocation is seen. Arterial calcifications are seen in soft tissues. Vascular stents are noted in the courses of iliac arteries.  IMPRESSION: No recent fracture or dislocation is  seen. Electronically Signed   By: Elmer Picker M.D.   On: 12/19/2021 13:58    Review of Systems  Constitutional: Negative.   HENT: Negative.    Eyes: Negative.   Respiratory: Negative.    Cardiovascular: Negative.   Gastrointestinal: Negative.   Genitourinary: Negative.   Musculoskeletal:  Positive for joint pain.       Right shoulder pain from prior fall  Skin: Negative.   Neurological:  Positive for weakness. Negative for dizziness, tingling, sensory change, speech change, focal weakness and seizures.  Endo/Heme/Allergies:  Bruises/bleeds easily.  Psychiatric/Behavioral: Negative.     Blood pressure (!) 159/100, pulse 88, temperature 98.1 F (36.7 C), temperature source Oral, resp. rate 18, SpO2 98 %. Estimated body mass index is 35.99 kg/m as calculated from the following:   Height as of 06/25/21: '5\' 7"'$  (1.702 m).   Weight as of 06/25/21: 104.2 kg.  Physical Exam HENT:     Head: Normocephalic.     Comments: Significant right periorbital edema and ecchymosis    Nose: Nose normal.     Mouth/Throat:     Mouth: Mucous membranes are dry.  Eyes:     Extraocular Movements: Extraocular movements intact.  Cardiovascular:     Rate and Rhythm: Normal rate and regular rhythm.     Pulses: Normal pulses.  Pulmonary:     Effort: Pulmonary effort is normal.  Abdominal:     Palpations: Abdomen is soft.  Musculoskeletal:     Right shoulder: Tenderness present. Decreased range of motion.  Skin:    General: Skin is warm and dry.     Capillary Refill: Capillary refill takes less than 2 seconds.     Findings: Rash present.       Neurological:     Mental Status: He is alert and oriented to person, place, and time. Mental status is at baseline.     Motor: Weakness present.  Psychiatric:        Mood and Affect: Mood normal.        Behavior: Behavior normal.     Assessment/Plan: Patient fell on 12/19/2021, striking his head. CT revealed small SAH and SDH. Platelets and Keppra  have been administered. Recommend follow up CT head 6 hours after first scan. No Neurosurgical interventions recommended at present. Neurosurgery will continue to follow.    Viona Gilmore, DNP, AGNP-C Nurse Practitioner  [facility] [address], [place], [place], [place] [address] P: [phone]    F: [phone]  12/19/2021, 7:30 PM  '

## 2021-12-20 ENCOUNTER — Inpatient Hospital Stay (HOSPITAL_COMMUNITY): Payer: Medicare Other

## 2021-12-20 DIAGNOSIS — R55 Syncope and collapse: Secondary | ICD-10-CM

## 2021-12-20 LAB — ECHOCARDIOGRAM COMPLETE
AR max vel: 0.97 cm2
AV Area VTI: 0.95 cm2
AV Area mean vel: 0.94 cm2
AV Mean grad: 11 mmHg
AV Peak grad: 19.2 mmHg
Ao pk vel: 2.19 m/s
Area-P 1/2: 3.99 cm2
Height: 67 in
S' Lateral: 3.1 cm
Weight: 3840 oz

## 2021-12-20 LAB — CBC
HCT: 35.7 % — ABNORMAL LOW (ref 39.0–52.0)
Hemoglobin: 12.7 g/dL — ABNORMAL LOW (ref 13.0–17.0)
MCH: 33.6 pg (ref 26.0–34.0)
MCHC: 35.6 g/dL (ref 30.0–36.0)
MCV: 94.4 fL (ref 80.0–100.0)
Platelets: 107 10*3/uL — ABNORMAL LOW (ref 150–400)
RBC: 3.78 MIL/uL — ABNORMAL LOW (ref 4.22–5.81)
RDW: 12.8 % (ref 11.5–15.5)
WBC: 6 10*3/uL (ref 4.0–10.5)
nRBC: 0 % (ref 0.0–0.2)

## 2021-12-20 LAB — BPAM PLATELET PHERESIS
Blood Product Expiration Date: 202308122359
ISSUE DATE / TIME: 202308101720
Unit Type and Rh: 5100

## 2021-12-20 LAB — GLUCOSE, CAPILLARY
Glucose-Capillary: 109 mg/dL — ABNORMAL HIGH (ref 70–99)
Glucose-Capillary: 131 mg/dL — ABNORMAL HIGH (ref 70–99)
Glucose-Capillary: 106 mg/dL — ABNORMAL HIGH (ref 70–99)
Glucose-Capillary: 123 mg/dL — ABNORMAL HIGH (ref 70–99)

## 2021-12-20 LAB — PREPARE PLATELET PHERESIS: Unit division: 0

## 2021-12-20 MED ORDER — POLYVINYL ALCOHOL 1.4 % OP SOLN
1.0000 [drp] | OPHTHALMIC | Status: DC | PRN
  Administered 2021-12-20 (×2): 1 [drp] via OPHTHALMIC
  Filled 2021-12-20: qty 15

## 2021-12-20 NOTE — Progress Notes (Signed)
[facility] Progress Note     Subjective: CC-  Wife at bedside. No complaints this morning. States that he slept well. Denies headache. Denies noting any new injuries.  Objective: Vital signs in last 24 hours: Temp:  [97.3 F (36.3 C)-98.5 F (36.9 C)] 97.3 F (36.3 C) (08/11 0735) Pulse Rate:  [46-96] 67 (08/11 0735) Resp:  [14-32] 18 (08/11 0735) BP: (133-180)/(70-105) 133/70 (08/11 0735) SpO2:  [96 %-100 %] 99 % (08/11 0735) Weight:  [108.9 kg] 108.9 kg (08/10 2123) Last BM Date : 12/18/21  Intake/Output from previous day: 08/10 0701 - 08/11 0700 In: 340 [Blood:340] Out: -  Intake/Output this shift: No intake/output data recorded.  PE: General: elderly male who is sitting up in bed in NAD HEENT: small abrasion to right forehead with no active bleeding. Right periorbital edema and ecchymosis.  PERRL. EOMs intact.   Heart: regular, rate, and rhythm.  +murmur. Palpable radial and pedal pulses bilaterally  Lungs: CTAB, no wheezes, rhonchi, or rales noted.  Respiratory effort nonlabored on room air Abd: soft, NT/ND MS: no BUE/BLE edema, calves soft and nontender. Superficial abrasion to right knee. No BLE pain with active or passive ROM Skin: warm and dry with no masses, lesions, or rashes Psych: A&Ox4 with an appropriate affect Neuro: MAEs  Lab Results:  Recent Labs    12/19/21 1333 12/20/21 0247  WBC 4.6 6.0  HGB 13.3 12.7*  HCT 38.6* 35.7*  PLT 107* 107*   BMET Recent Labs    12/19/21 1333  NA 133*  K 4.5  CL 100  CO2 24  GLUCOSE 181*  BUN 21  CREATININE 1.16  CALCIUM 9.1   PT/INR Recent Labs    12/19/21 1333  LABPROT 13.9  INR 1.1   CMP     Component Value Date/Time   NA 133 (L) 12/19/2021 1333   K 4.5 12/19/2021 1333   CL 100 12/19/2021 1333   CO2 24 12/19/2021 1333   GLUCOSE 181 (H) 12/19/2021 1333   BUN 21 12/19/2021 1333   CREATININE 1.16 12/19/2021 1333   CALCIUM 9.1 12/19/2021 1333   PROT 6.8 12/19/2021 1333    ALBUMIN 3.9 12/19/2021 1333   AST 21 12/19/2021 1333   ALT 18 12/19/2021 1333   ALKPHOS 45 12/19/2021 1333   BILITOT 0.9 12/19/2021 1333   GFRNONAA >60 12/19/2021 1333   GFRAA >60 11/26/2017 0500   Lipase  No results found for: "LIPASE"     Studies/Results: CT Head Wo Contrast  Addendum Date: 12/19/2021   ADDENDUM REPORT: 12/19/2021 21:08 ADDENDUM: These results were called by telephone at the time of interpretation on 12/19/2021 at 9:07 pm to provider Dr. Melany Guernsey, who verbally acknowledged these results. Electronically Signed   By: Iven Finn M.D.   On: 12/19/2021 21:08   Result Date: 12/19/2021 CLINICAL DATA:  CT Head 6 hour follow-up CT Head Level 2 Trauma; Fall on blood thinners EXAM: CT HEAD WITHOUT CONTRAST TECHNIQUE: Contiguous axial images were obtained from the base of the skull through the vertex without intravenous contrast. RADIATION DOSE REDUCTION: This exam was performed according to the departmental dose-optimization program which includes automated exposure control, adjustment of the mA and/or kV according to patient size and/or use of iterative reconstruction technique. COMPARISON:  CT head 12/19/2021 1:59 p.m. FINDINGS: Brain: No evidence of large-territorial acute infarction. No parenchymal hemorrhage. No mass lesion. Gross Similar-appearing small volume right temporal and anterior right frontal subarachnoid hemorrhage. Interval blooming of a now 5 mm left basal ganglia/insular  region hyperdensity suggestive of intraparenchymal bleed. Query developing intraparenchymal right frontal lobe hematoma (3:21) measuring up to 4 mm. No mass effect or midline shift. No hydrocephalus. Basilar cisterns are patent. Vascular: No hyperdense vessel. Atherosclerotic calcifications are present within the cavernous internal carotid arteries. Skull: No acute fracture or focal lesion. Sinuses/Orbits: Bilateral frontal and ethmoid mucosal thickening. Paranasal sinuses and mastoid air cells are  clear. Bilateral lens replacement. The orbits are unremarkable. Other: Increase in size right frontal scalp hematoma formation measuring up to 1.7 cm. Interval development of a marked right periorbital hematoma. IMPRESSION: 1. Interval development of a 5 mm left basal ganglia/insular region intraparenchymal hemorrhage. 2. Query developing 4 mm right frontal intraparenchymal hematoma. 3. Grossly stable small volume right temporal and anterior right frontal subarachnoid hemorrhage. 4. Increasing in size right frontal scalp hematoma formation measuring up to 1.7 cm with interval development of marked right periorbital hematoma. Electronically Signed: By: Iven Finn M.D. On: 12/19/2021 20:59   CT CHEST W CONTRAST  Result Date: 12/19/2021 CLINICAL DATA:  Chest trauma, blunt fall, complaining of right sided chest pain. Fall EXAM: CT CHEST WITH CONTRAST TECHNIQUE: Multidetector CT imaging of the chest was performed during intravenous contrast administration. RADIATION DOSE REDUCTION: This exam was performed according to the departmental dose-optimization program which includes automated exposure control, adjustment of the mA and/or kV according to patient size and/or use of iterative reconstruction technique. CONTRAST:  57m OMNIPAQUE IOHEXOL 300 MG/ML  SOLN COMPARISON:  11/02/2017 FINDINGS: Cardiovascular: Diffuse coronary artery and aortic calcifications. Heart is normal size. Aorta is normal caliber. Mediastinum/Nodes: No mediastinal, hilar, or axillary adenopathy. Trachea and esophagus are unremarkable. Thyroid unremarkable. Lungs/Pleura: Lungs are clear. No focal airspace opacities or suspicious nodules. No effusions. No pneumothorax Upper Abdomen: Cirrhotic liver. No acute abnormality. Moderate-sized hiatal hernia. Musculoskeletal: No acute bony abnormality. Chest wall soft tissues are unremarkable. IMPRESSION: No acute cardiopulmonary disease. Diffuse coronary artery disease. Moderate-sized hiatal hernia.  Aortic Atherosclerosis (ICD10-I70.0). Electronically Signed   By: KRolm BaptiseM.D.   On: 12/19/2021 17:24   DG Forearm Right  Result Date: 12/19/2021 CLINICAL DATA:  Patient on blood thinners. Level III fall. Contusion on the RIGHT side of the forehead. EXAM: RIGHT FOREARM - 2 VIEW COMPARISON:  None Available. FINDINGS: There is no evidence of fracture or other focal bone lesions. Soft tissues are unremarkable. IMPRESSION: Negative. Electronically Signed   By: ENolon NationsM.D.   On: 12/19/2021 14:47   DG Hand Complete Right  Result Date: 12/19/2021 CLINICAL DATA:  Patient on blood thinners. Level III fall. Contusion on the RIGHT side of the forehead. EXAM: RIGHT HAND - COMPLETE 3+ VIEW COMPARISON:  None Available. FINDINGS: Pulse oximeter overlies the third digit. There is no acute fracture or subluxation. No radiopaque foreign body or soft tissue gas. IMPRESSION: Negative. Electronically Signed   By: ENolon NationsM.D.   On: 12/19/2021 14:46   DG Shoulder Right  Result Date: 12/19/2021 CLINICAL DATA:  Patient on blood thinners. Level III fall. Contusion on the RIGHT side of the forehead. EXAM: RIGHT SHOULDER - 2+ VIEW COMPARISON:  Chest x-ray 11/25/2017 FINDINGS: There is an acute fracture of the RIGHT humeral neck, with overriding of fracture fragments and varus angulation. Humeral head is located in the glenoid fossa. IMPRESSION: Acute fracture of the RIGHT humeral neck. Electronically Signed   By: ENolon NationsM.D.   On: 12/19/2021 14:45   CT Maxillofacial Wo Contrast  Result Date: 12/19/2021 CLINICAL DATA:  Facial trauma, blunt. Fall. Patient  is on blood thinners. EXAM: CT MAXILLOFACIAL WITHOUT CONTRAST TECHNIQUE: Multidetector CT imaging of the maxillofacial structures was performed. Multiplanar CT image reconstructions were also generated. RADIATION DOSE REDUCTION: This exam was performed according to the departmental dose-optimization program which includes automated exposure  control, adjustment of the mA and/or kV according to patient size and/or use of iterative reconstruction technique. COMPARISON:  No acute fractures are present. Mandible is intact and located. FINDINGS: Osseous: No acute fracture is present. The mandible is intact and located. Orbits: Bilateral lens replacements are noted. Globes and orbits are otherwise unremarkable. Sinuses: Mucosal thickening is present the anterior ethmoid air cells and inferior frontal sinuses without fluid levels. Soft tissues: Large right periorbital and temporal hematoma is present without underlying fracture or radiopaque foreign body. No other focal soft tissue injury is present. Limited intracranial: Subarachnoid and subdural blood is described in detail on CT head report of the same day. IMPRESSION: 1. Large right periorbital and temporal hematoma without underlying fracture or radiopaque foreign body. 2. No other acute trauma to the face. 3. Subarachnoid and subdural blood is described in detail on CT head report of the same day. Critical Value/emergent results were called by telephone at the time of interpretation on 12/19/2021 at 2:19pm to provider [facility] , who verbally acknowledged these results. Electronically Signed   By: San Morelle M.D.   On: 12/19/2021 14:44   DG Wrist Complete Right  Result Date: 12/19/2021 CLINICAL DATA:  Patient on blood thinners. Level III fall. Contusion on the RIGHT side of the forehead. EXAM: RIGHT WRIST - COMPLETE 3+ VIEW COMPARISON:  None Available. FINDINGS: There is no evidence of fracture or dislocation. There is no evidence of arthropathy or other focal bone abnormality. Soft tissues are unremarkable. IMPRESSION: Negative. Electronically Signed   By: Nolon Nations M.D.   On: 12/19/2021 14:43   CT Cervical Spine Wo Contrast  Result Date: 12/19/2021 CLINICAL DATA:  Fall.  Patient is on blood thinners. EXAM: CT CERVICAL SPINE WITHOUT CONTRAST TECHNIQUE: Multidetector CT  imaging of the cervical spine was performed without intravenous contrast. Multiplanar CT image reconstructions were also generated. RADIATION DOSE REDUCTION: This exam was performed according to the departmental dose-optimization program which includes automated exposure control, adjustment of the mA and/or kV according to patient size and/or use of iterative reconstruction technique. COMPARISON:  None Available. FINDINGS: Alignment: No significant listhesis is present. Cervical lordosis is preserved. Skull base and vertebrae: Craniocervical junction is within normal limits. Vertebral body heights are normal. No acute fractures are present. Ossification along the posterior longitudinal ligament is likely related to remote trauma. Soft tissues and spinal canal: No prevertebral fluid or swelling. No visible canal hematoma. Atherosclerotic calcifications are present within the carotid arteries bilaterally, left greater than right. Disc levels:  No significant osseous stenosis is evident. Upper chest: The lung apices are clear. Thoracic inlet is within normal limits. IMPRESSION: 1. No acute fracture or traumatic subluxation. 2. Ossification along the posterior longitudinal ligament is likely related to remote trauma. 3. Atherosclerosis. These results were called by telephone at the time of interpretation on 12/19/2021 at 2:19pm to provider [facility] , who verbally acknowledged these results. Electronically Signed   By: San Morelle M.D.   On: 12/19/2021 14:38   CT Head Wo Contrast  Result Date: 12/19/2021 CLINICAL DATA:  Level 2 trauma. Blunt. Fall. Patient is on blood thinners EXAM: CT HEAD WITHOUT CONTRAST TECHNIQUE: Contiguous axial images were obtained from the base of the skull through the vertex  without intravenous contrast. RADIATION DOSE REDUCTION: This exam was performed according to the departmental dose-optimization program which includes automated exposure control, adjustment of the mA  and/or kV according to patient size and/or use of iterative reconstruction technique. COMPARISON:  CT head without contrast 07/21/17 FINDINGS: Brain: Acute subarachnoid hemorrhage is present the posterior right temporal lobe in more anteriorly over the right frontal lobe. A thin subdural hemorrhage is also present adjacent to the right temporal tip in the right middle cranial fossa. Remote cerebellar infarcts are again seen, right greater than left. No acute ischemic infarct is present. Moderate generalized atrophy is similar the prior exam. The ventricles are proportionate to the degree of atrophy. Vascular: Dense atherosclerotic calcifications are present within the cavernous internal carotid arteries. No hyperdense vessel is present. Skull: A large right periorbital and temporal hematoma is present. No underlying fracture is present. Calvarium is intact. No foreign body is present. No other focal extracranial soft tissue injury is present. Sinuses/Orbits: Anterior ethmoid air cells are opacified. Extensive mucosal thickening is present in the frontal sinuses bilaterally. The paranasal sinuses and mastoid air cells are otherwise clear. Bilateral lens replacements are noted. Globes and orbits are otherwise unremarkable. IMPRESSION: 1. Acute subarachnoid hemorrhage involving the posterior right temporal lobe and anterior right frontal lobe. 2. Thin subdural hemorrhage adjacent to the right temporal tip in the right middle cranial fossa. 3. Large right periorbital and temporal hematoma without underlying fracture. 4. Remote cerebellar infarcts, right greater than left. 5. Stable atrophy and white matter disease. Critical Value/emergent results were called by telephone at the time of interpretation on 12/19/2021 at 2:19 pm to provider [facility] , who verbally acknowledged these results. Electronically Signed   By: San Morelle M.D.   On: 12/19/2021 14:34   DG Chest Portable 1 View  Result Date:  12/19/2021 CLINICAL DATA:  Trauma, fall EXAM: PORTABLE CHEST 1 VIEW COMPARISON:  11/25/2017 FINDINGS: The heart size and mediastinal contours are within normal limits. Both lungs are clear. There is deformity in the right shoulder with possible fracture or dislocation. Right shoulder is not adequately visualized for evaluation. IMPRESSION: There are no focal pulmonary infiltrates. There is no pleural effusion or pneumothorax. There is deformity in right shoulder suggesting possible fracture dislocation. Right shoulder is not included in its entirety limiting evaluation. Routine radiographs of right shoulder is recommended for further evaluation. Electronically Signed   By: Elmer Picker M.D.   On: 12/19/2021 14:03   DG Pelvis Portable  Result Date: 12/19/2021 CLINICAL DATA:  Trauma, fall EXAM: PORTABLE PELVIS 1-2 VIEWS COMPARISON:  None Available. FINDINGS: No displaced fracture or dislocation is seen. Arterial calcifications are seen in soft tissues. Vascular stents are noted in the courses of iliac arteries. IMPRESSION: No recent fracture or dislocation is seen. Electronically Signed   By: Elmer Picker M.D.   On: 12/19/2021 13:58    Anti-infectives: Anti-infectives (From admission, onward)    None        Assessment/Plan Fall Possible syncope - work up per medicine SAH/SDH - per NSGY. Keppra. Hold plavix. Thrombocytopenia - s/p  1 unit platelets 8/10. Platelets 107, stable Skin tears - right wrist and right forehead, local wound care Right periorbital and frontal scalp hematoma - no underlying fx on CT. Pain control Remote right humeral neck fx - per patient this occurred in September from a fall and was managed nonop per Dr. Onnie Graham Right chest wall pain - resolved. CT negative for acute injury   ID - none VTE -  SCDs FEN - IVF, HH diet Foley - none   Dispo - Tertiary survey negative. TBI team therapies. Trauma will sign off, please call with questions or concerns.    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

## 2021-12-20 NOTE — Progress Notes (Signed)
Progress Note  Patient Name: Jose Beck Date of Encounter: 12/20/2021  [facility] Cardiologist: Peter Martinique, MD   Subjective   No CP or dyspnea  Inpatient Medications    Scheduled Meds:  atorvastatin  80 mg Oral q1800   carvedilol  25 mg Oral BID WC   insulin aspart  0-15 Units Subcutaneous TID WC   insulin detemir  20 Units Subcutaneous q morning   losartan  25 mg Oral Daily   niMODipine  30 mg Oral Q4H   Continuous Infusions:  levETIRAcetam 500 mg (12/20/21 0547)   PRN Meds: acetaminophen **OR** acetaminophen (TYLENOL) oral liquid 160 mg/5 mL **OR** acetaminophen, hydrALAZINE, ondansetron (ZOFRAN) IV, senna-docusate   Vital Signs    Vitals:   12/19/21 2123 12/19/21 2203 12/20/21 0500 12/20/21 0735  BP:  (!) 148/105 134/70 133/70  Pulse:  84 68 67  Resp:  18  18  Temp:  98.5 F (36.9 C) 98.4 F (36.9 C) (!) 97.3 F (36.3 C)  TempSrc:  Oral Oral Oral  SpO2:  100% 99% 99%  Weight: 108.9 kg     Height: '5\' 7"'$  (1.702 m)       Intake/Output Summary (Last 24 hours) at 12/20/2021 0939 Last data filed at 12/19/2021 1914 Gross per 24 hour  Intake 340 ml  Output --  Net 340 ml      12/19/2021    9:23 PM 06/25/2021    3:32 PM 11/05/2020    3:44 PM  Last 3 Weights  Weight (lbs) 240 lb 229 lb 12.8 oz 240 lb  Weight (kg) 108.863 kg 104.237 kg 108.863 kg      Telemetry    Sinus bradycardia with 6 beats NSVT- Personally Reviewed   Physical Exam   GEN: No acute distress.  Large hematoma left frontal area Neck: No JVD Cardiac: RRR, 2/6 systolic murmur Respiratory: Clear to auscultation bilaterally. GI: Soft, nontender, non-distended  MS: No edema Neuro:  Mild right side weakness Psych: Normal affect   Labs    High Sensitivity Troponin:   Recent Labs  Lab 12/19/21 1333  TROPONINIHS 5     Chemistry Recent Labs  Lab 12/19/21 1333 12/19/21 1348  NA 133*  --   K 4.5  --   CL 100  --   CO2 24  --   GLUCOSE 181*  --   BUN 21  --    CREATININE 1.16  --   CALCIUM 9.1  --   MG  --  2.0  PROT 6.8  --   ALBUMIN 3.9  --   AST 21  --   ALT 18  --   ALKPHOS 45  --   BILITOT 0.9  --   GFRNONAA >60  --   ANIONGAP 9  --      Hematology Recent Labs  Lab 12/19/21 1333 12/20/21 0247  WBC 4.6 6.0  RBC 3.94* 3.78*  HGB 13.3 12.7*  HCT 38.6* 35.7*  MCV 98.0 94.4  MCH 33.8 33.6  MCHC 34.5 35.6  RDW 13.0 12.8  PLT 107* 107*    Radiology    CT Head Wo Contrast  Addendum Date: 12/19/2021   ADDENDUM REPORT: 12/19/2021 21:08 ADDENDUM: These results were called by telephone at the time of interpretation on 12/19/2021 at 9:07 pm to provider Dr. Melany Guernsey, who verbally acknowledged these results. Electronically Signed   By: Iven Finn M.D.   On: 12/19/2021 21:08   Result Date: 12/19/2021 CLINICAL DATA:  CT Head 6 hour  follow-up CT Head Level 2 Trauma; Fall on blood thinners EXAM: CT HEAD WITHOUT CONTRAST TECHNIQUE: Contiguous axial images were obtained from the base of the skull through the vertex without intravenous contrast. RADIATION DOSE REDUCTION: This exam was performed according to the departmental dose-optimization program which includes automated exposure control, adjustment of the mA and/or kV according to patient size and/or use of iterative reconstruction technique. COMPARISON:  CT head 12/19/2021 1:59 p.m. FINDINGS: Brain: No evidence of large-territorial acute infarction. No parenchymal hemorrhage. No mass lesion. Gross Similar-appearing small volume right temporal and anterior right frontal subarachnoid hemorrhage. Interval blooming of a now 5 mm left basal ganglia/insular region hyperdensity suggestive of intraparenchymal bleed. Query developing intraparenchymal right frontal lobe hematoma (3:21) measuring up to 4 mm. No mass effect or midline shift. No hydrocephalus. Basilar cisterns are patent. Vascular: No hyperdense vessel. Atherosclerotic calcifications are present within the cavernous internal carotid  arteries. Skull: No acute fracture or focal lesion. Sinuses/Orbits: Bilateral frontal and ethmoid mucosal thickening. Paranasal sinuses and mastoid air cells are clear. Bilateral lens replacement. The orbits are unremarkable. Other: Increase in size right frontal scalp hematoma formation measuring up to 1.7 cm. Interval development of a marked right periorbital hematoma. IMPRESSION: 1. Interval development of a 5 mm left basal ganglia/insular region intraparenchymal hemorrhage. 2. Query developing 4 mm right frontal intraparenchymal hematoma. 3. Grossly stable small volume right temporal and anterior right frontal subarachnoid hemorrhage. 4. Increasing in size right frontal scalp hematoma formation measuring up to 1.7 cm with interval development of marked right periorbital hematoma. Electronically Signed: By: Iven Finn M.D. On: 12/19/2021 20:59   CT CHEST W CONTRAST  Result Date: 12/19/2021 CLINICAL DATA:  Chest trauma, blunt fall, complaining of right sided chest pain. Fall EXAM: CT CHEST WITH CONTRAST TECHNIQUE: Multidetector CT imaging of the chest was performed during intravenous contrast administration. RADIATION DOSE REDUCTION: This exam was performed according to the departmental dose-optimization program which includes automated exposure control, adjustment of the mA and/or kV according to patient size and/or use of iterative reconstruction technique. CONTRAST:  74m OMNIPAQUE IOHEXOL 300 MG/ML  SOLN COMPARISON:  11/02/2017 FINDINGS: Cardiovascular: Diffuse coronary artery and aortic calcifications. Heart is normal size. Aorta is normal caliber. Mediastinum/Nodes: No mediastinal, hilar, or axillary adenopathy. Trachea and esophagus are unremarkable. Thyroid unremarkable. Lungs/Pleura: Lungs are clear. No focal airspace opacities or suspicious nodules. No effusions. No pneumothorax Upper Abdomen: Cirrhotic liver. No acute abnormality. Moderate-sized hiatal hernia. Musculoskeletal: No acute bony  abnormality. Chest wall soft tissues are unremarkable. IMPRESSION: No acute cardiopulmonary disease. Diffuse coronary artery disease. Moderate-sized hiatal hernia. Aortic Atherosclerosis (ICD10-I70.0). Electronically Signed   By: KRolm BaptiseM.D.   On: 12/19/2021 17:24   DG Forearm Right  Result Date: 12/19/2021 CLINICAL DATA:  Patient on blood thinners. Level III fall. Contusion on the RIGHT side of the forehead. EXAM: RIGHT FOREARM - 2 VIEW COMPARISON:  None Available. FINDINGS: There is no evidence of fracture or other focal bone lesions. Soft tissues are unremarkable. IMPRESSION: Negative. Electronically Signed   By: ENolon NationsM.D.   On: 12/19/2021 14:47   DG Hand Complete Right  Result Date: 12/19/2021 CLINICAL DATA:  Patient on blood thinners. Level III fall. Contusion on the RIGHT side of the forehead. EXAM: RIGHT HAND - COMPLETE 3+ VIEW COMPARISON:  None Available. FINDINGS: Pulse oximeter overlies the third digit. There is no acute fracture or subluxation. No radiopaque foreign body or soft tissue gas. IMPRESSION: Negative. Electronically Signed   By: ENolon NationsM.D.  On: 12/19/2021 14:46   DG Shoulder Right  Result Date: 12/19/2021 CLINICAL DATA:  Patient on blood thinners. Level III fall. Contusion on the RIGHT side of the forehead. EXAM: RIGHT SHOULDER - 2+ VIEW COMPARISON:  Chest x-ray 11/25/2017 FINDINGS: There is an acute fracture of the RIGHT humeral neck, with overriding of fracture fragments and varus angulation. Humeral head is located in the glenoid fossa. IMPRESSION: Acute fracture of the RIGHT humeral neck. Electronically Signed   By: Nolon Nations M.D.   On: 12/19/2021 14:45   CT Maxillofacial Wo Contrast  Result Date: 12/19/2021 CLINICAL DATA:  Facial trauma, blunt. Fall. Patient is on blood thinners. EXAM: CT MAXILLOFACIAL WITHOUT CONTRAST TECHNIQUE: Multidetector CT imaging of the maxillofacial structures was performed. Multiplanar CT image reconstructions  were also generated. RADIATION DOSE REDUCTION: This exam was performed according to the departmental dose-optimization program which includes automated exposure control, adjustment of the mA and/or kV according to patient size and/or use of iterative reconstruction technique. COMPARISON:  No acute fractures are present. Mandible is intact and located. FINDINGS: Osseous: No acute fracture is present. The mandible is intact and located. Orbits: Bilateral lens replacements are noted. Globes and orbits are otherwise unremarkable. Sinuses: Mucosal thickening is present the anterior ethmoid air cells and inferior frontal sinuses without fluid levels. Soft tissues: Large right periorbital and temporal hematoma is present without underlying fracture or radiopaque foreign body. No other focal soft tissue injury is present. Limited intracranial: Subarachnoid and subdural blood is described in detail on CT head report of the same day. IMPRESSION: 1. Large right periorbital and temporal hematoma without underlying fracture or radiopaque foreign body. 2. No other acute trauma to the face. 3. Subarachnoid and subdural blood is described in detail on CT head report of the same day. Critical Value/emergent results were called by telephone at the time of interpretation on 12/19/2021 at 2:19pm to provider [facility] , who verbally acknowledged these results. Electronically Signed   By: San Morelle M.D.   On: 12/19/2021 14:44   DG Wrist Complete Right  Result Date: 12/19/2021 CLINICAL DATA:  Patient on blood thinners. Level III fall. Contusion on the RIGHT side of the forehead. EXAM: RIGHT WRIST - COMPLETE 3+ VIEW COMPARISON:  None Available. FINDINGS: There is no evidence of fracture or dislocation. There is no evidence of arthropathy or other focal bone abnormality. Soft tissues are unremarkable. IMPRESSION: Negative. Electronically Signed   By: Nolon Nations M.D.   On: 12/19/2021 14:43   CT Cervical Spine Wo  Contrast  Result Date: 12/19/2021 CLINICAL DATA:  Fall.  Patient is on blood thinners. EXAM: CT CERVICAL SPINE WITHOUT CONTRAST TECHNIQUE: Multidetector CT imaging of the cervical spine was performed without intravenous contrast. Multiplanar CT image reconstructions were also generated. RADIATION DOSE REDUCTION: This exam was performed according to the departmental dose-optimization program which includes automated exposure control, adjustment of the mA and/or kV according to patient size and/or use of iterative reconstruction technique. COMPARISON:  None Available. FINDINGS: Alignment: No significant listhesis is present. Cervical lordosis is preserved. Skull base and vertebrae: Craniocervical junction is within normal limits. Vertebral body heights are normal. No acute fractures are present. Ossification along the posterior longitudinal ligament is likely related to remote trauma. Soft tissues and spinal canal: No prevertebral fluid or swelling. No visible canal hematoma. Atherosclerotic calcifications are present within the carotid arteries bilaterally, left greater than right. Disc levels:  No significant osseous stenosis is evident. Upper chest: The lung apices are clear. Thoracic inlet is  within normal limits. IMPRESSION: 1. No acute fracture or traumatic subluxation. 2. Ossification along the posterior longitudinal ligament is likely related to remote trauma. 3. Atherosclerosis. These results were called by telephone at the time of interpretation on 12/19/2021 at 2:19pm to provider [facility] , who verbally acknowledged these results. Electronically Signed   By: San Morelle M.D.   On: 12/19/2021 14:38   CT Head Wo Contrast  Result Date: 12/19/2021 CLINICAL DATA:  Level 2 trauma. Blunt. Fall. Patient is on blood thinners EXAM: CT HEAD WITHOUT CONTRAST TECHNIQUE: Contiguous axial images were obtained from the base of the skull through the vertex without intravenous contrast. RADIATION DOSE  REDUCTION: This exam was performed according to the departmental dose-optimization program which includes automated exposure control, adjustment of the mA and/or kV according to patient size and/or use of iterative reconstruction technique. COMPARISON:  CT head without contrast 07/21/17 FINDINGS: Brain: Acute subarachnoid hemorrhage is present the posterior right temporal lobe in more anteriorly over the right frontal lobe. A thin subdural hemorrhage is also present adjacent to the right temporal tip in the right middle cranial fossa. Remote cerebellar infarcts are again seen, right greater than left. No acute ischemic infarct is present. Moderate generalized atrophy is similar the prior exam. The ventricles are proportionate to the degree of atrophy. Vascular: Dense atherosclerotic calcifications are present within the cavernous internal carotid arteries. No hyperdense vessel is present. Skull: A large right periorbital and temporal hematoma is present. No underlying fracture is present. Calvarium is intact. No foreign body is present. No other focal extracranial soft tissue injury is present. Sinuses/Orbits: Anterior ethmoid air cells are opacified. Extensive mucosal thickening is present in the frontal sinuses bilaterally. The paranasal sinuses and mastoid air cells are otherwise clear. Bilateral lens replacements are noted. Globes and orbits are otherwise unremarkable. IMPRESSION: 1. Acute subarachnoid hemorrhage involving the posterior right temporal lobe and anterior right frontal lobe. 2. Thin subdural hemorrhage adjacent to the right temporal tip in the right middle cranial fossa. 3. Large right periorbital and temporal hematoma without underlying fracture. 4. Remote cerebellar infarcts, right greater than left. 5. Stable atrophy and white matter disease. Critical Value/emergent results were called by telephone at the time of interpretation on 12/19/2021 at 2:19 pm to provider [facility] , who verbally  acknowledged these results. Electronically Signed   By: San Morelle M.D.   On: 12/19/2021 14:34   DG Chest Portable 1 View  Result Date: 12/19/2021 CLINICAL DATA:  Trauma, fall EXAM: PORTABLE CHEST 1 VIEW COMPARISON:  11/25/2017 FINDINGS: The heart size and mediastinal contours are within normal limits. Both lungs are clear. There is deformity in the right shoulder with possible fracture or dislocation. Right shoulder is not adequately visualized for evaluation. IMPRESSION: There are no focal pulmonary infiltrates. There is no pleural effusion or pneumothorax. There is deformity in right shoulder suggesting possible fracture dislocation. Right shoulder is not included in its entirety limiting evaluation. Routine radiographs of right shoulder is recommended for further evaluation. Electronically Signed   By: Elmer Picker M.D.   On: 12/19/2021 14:03   DG Pelvis Portable  Result Date: 12/19/2021 CLINICAL DATA:  Trauma, fall EXAM: PORTABLE PELVIS 1-2 VIEWS COMPARISON:  None Available. FINDINGS: No displaced fracture or dislocation is seen. Arterial calcifications are seen in soft tissues. Vascular stents are noted in the courses of iliac arteries. IMPRESSION: No recent fracture or dislocation is seen. Electronically Signed   By: Elmer Picker M.D.   On: 12/19/2021 13:58  Patient Profile     76 year old male with past medical history of coronary artery disease, chronic diastolic congestive heart failure, hypertension, hyperlipidemia, diabetes mellitus, prior CVA, history of mild aortic stenosis by echocardiogram in 2017, multiple falls for evaluation of question syncope.  Assessment & Plan    Question syncope-as outlined previously it is unclear whether patient had frank syncope or had a mechanical fall.  He has had falls in the past and certainly is at risk.  Telemetry personally reviewed and shows sinus bradycardia with 6 beats nonsustained ventricular tachycardia.  No  significant pauses.  Repeat echocardiogram.  If LV function normal we will likely not pursue further evaluation unless he has documented syncope in the future. Aortic stenosis-mild on previous echocardiogram in 2017.  Aortic stenosis murmur noted on examination.  Repeat study pending. Status post fall with chest wall hematoma, subarachnoid/subdural hematoma-Per trauma surgery and neurosurgery. Hypertension-continue present medications and follow. Hyperlipidemia-continue statin. Coronary artery disease-hold Plavix in setting of intracranial hemorrhage.  For questions or updates, please contact [place] Please consult [email or web address] for contact info under        Signed, Kirk Ruths, MD  12/20/2021, 9:39 AM

## 2021-12-20 NOTE — Evaluation (Signed)
Physical Therapy Evaluation  Patient Details Name: Jose Beck MRN: [id number] DOB: 1946-04-04 Today's Date: 12/20/2021  History of Present Illness  Pt is a 76 y/o male who presents to [facility] ED 12/19/2021 s/p fall at home, hitting head. 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 There is also a thin subdural hemorrhage adjacent to the right temporal tip in the right middle cranial fossa. No current recommendations per neurosurgery. PMH significant for AAA, CHF, DM II, HTN, prostate cancer, and stroke (residual right-sided weakness and mild expressive aphasia).   Clinical Impression  Pt admitted with above diagnosis. Pt currently with functional limitations due to the deficits listed below (see PT Problem List). At the time of PT eval pt was able to ambulate with the [facility] with min assist for balance support and safety. Overall pt appears to have decreased safety awareness, decreased awareness of deficits, and slow processing. VC's and assist for optimal safety. Wife stating she feels pt is near baseline of function. Feel pt is at a high risk of falls, and recommend follow-up PT at the [facility] level to maximize functional independence and safety. Acutely, pt will benefit from skilled PT to increase their independence and safety with mobility to allow discharge to the venue listed below.          Recommendations for follow up therapy are one component of a multi-disciplinary discharge planning process, led by the attending physician.  Recommendations may be updated based on patient status, additional functional criteria and insurance authorization.  Follow Up Recommendations Home health PT      Assistance Recommended at Discharge Frequent or constant Supervision/Assistance  Patient can return home with the following  A little help with walking and/or transfers;A little help with bathing/dressing/bathroom;Assistance with  cooking/housework;Assist for transportation;Help with stairs or ramp for entrance    Equipment Recommendations None recommended by PT  Recommendations for Other Services       Functional Status Assessment Patient has had a recent decline in their functional status and demonstrates the ability to make significant improvements in function in a reasonable and predictable amount of time.     Precautions / Restrictions Precautions Precautions: Fall Restrictions Weight Bearing Restrictions: No      Mobility  Bed Mobility               General bed mobility comments: Pt was received sitting up EOB with OT present.    Transfers Overall transfer level: Needs assistance Equipment used: Straight cane Transfers: Sit to/from Stand Sit to Stand: Min guard           General transfer comment: Min guard A for safety. Pt very wobbly with initial stand but able to recover with increased time.    Ambulation/Gait Ambulation/Gait assistance: Min assist Gait Distance (Feet): 100 Feet Assistive device: Straight cane Gait Pattern/deviations: Decreased stride length, Shuffle, Wide base of support Gait velocity: Decreased Gait velocity interpretation: <1.31 ft/sec, indicative of household ambulator   General Gait Details: VC's for improved posture, scanning the hallway due to decreased vision, and for general safety. Pt switching his cane between his R and L hands, attempting to hold onto external surfaces instead of the cane, but refusing the RW. Occasional min assist required for balance.  Stairs            Wheelchair Mobility    Modified Rankin (Stroke Patients Only)       Balance Overall balance assessment: Needs assistance Sitting-balance support:  No upper extremity supported Sitting balance-Leahy Scale: Fair     Standing balance support: Single extremity supported Standing balance-Leahy Scale: Poor Standing balance comment: Wide BOS and decreased balance standing  at sink.                             Pertinent Vitals/Pain Pain Assessment Pain Assessment: Faces Faces Pain Scale: Hurts a little bit Pain Location: R eye. Pt reporting "it doesnt hurt much" Pain Descriptors / Indicators: Discomfort, Aching, Tender, Operative site guarding Pain Intervention(s): Limited activity within patient's tolerance, Monitored during session, Repositioned    Home Living Family/patient expects to be discharged to:: Private residence Living Arrangements: Spouse/significant other Available Help at Discharge: Family;Available 24 hours/day Type of Home: House Home Access: Stairs to enter Entrance Stairs-Rails: None Entrance Stairs-Number of Steps: 2   Home Layout: Two level;Able to live on main level with bedroom/bathroom Home Equipment: Rolling Walker (2 wheels);Cane - single point;Tub bench      Prior Function Prior Level of Function : Needs assist             Mobility Comments: Using cane. Pt with prior falls as well. ADLs Comments: Pt wife performing most of the cooking and cleaning. Pt independent in ADL, medication management per pt and wife     Hand Dominance   Dominant Hand: Right    Extremity/Trunk Assessment   Upper Extremity Assessment Upper Extremity Assessment: RUE deficits/detail RUE Deficits / Details: Pt holding RUE in flexed position at all times, however, able to move RUE. Shoulder flexion limited ~20 degrees from prior CVA. Decreaed coordination, however, grip strength and push/pull 4+/5 RUE Coordination: decreased fine motor;decreased gross motor LUE Deficits / Details: Decreased speed and coordination with finger opposition. Pt with decreased strength~4+/5 LUE Coordination: decreased fine motor    Lower Extremity Assessment Lower Extremity Assessment: RLE deficits/detail RLE Deficits / Details: with MMT, LE's appear strong (grossly 4+/5 to 5/5 strength in quads, hamstrings, hip flexors, ankle DF) however  functionally LLE's appear to fatigue quickly. RLE Sensation: WNL    Cervical / Trunk Assessment Cervical / Trunk Assessment: Kyphotic  Communication   Communication: Expressive difficulties  Cognition Arousal/Alertness: Awake/alert Behavior During Therapy: Flat affect Overall Cognitive Status: History of cognitive impairments - at baseline Area of Impairment: Following commands, Safety/judgement, Awareness, Problem solving, Attention                   Current Attention Level: Sustained (Pt with difficulty with visual attention during visual testing)   Following Commands: Follows one step commands consistently, Follows one step commands with increased time Safety/Judgement: Decreased awareness of safety, Decreased awareness of deficits (Unwilling to attempt use of RW) Awareness: Emergent Problem Solving: Slow processing, Requires verbal cues General Comments: Pt wife reporting pt seems near his baseline. Pt with decreased safety awareness, awareness, and problem solving this session. Pt following commands with increased time. Demonstrating decreased safety awareness; Pt frequently switching cane between R and L hand during functional mobility        General Comments General comments (skin integrity, edema, etc.): Wife present reporting pt is very close to baseline    Exercises     Assessment/Plan    PT Assessment Patient needs continued PT services  PT Problem List Decreased strength;Decreased activity tolerance;Decreased balance;Decreased mobility;Decreased cognition;Decreased coordination;Decreased knowledge of use of DME;Decreased safety awareness;Decreased knowledge of precautions;Pain       PT Treatment Interventions DME instruction;Gait training;Stair training;Functional mobility training;Therapeutic  activities;Therapeutic exercise;Balance training;Cognitive remediation;Neuromuscular re-education;Patient/family education    PT Goals (Current goals can be found in  the Care Plan section)  Acute Rehab PT Goals Patient Stated Goal: Return home and continue using his [facility] PT Goal Formulation: With patient/family Time For Goal Achievement: 12/27/21 Potential to Achieve Goals: Good    Frequency Min 3X/week     Co-evaluation               AM-PAC PT "6 Clicks" Mobility  Outcome Measure Help needed turning from your back to your side while in a flat bed without using bedrails?: A Little Help needed moving from lying on your back to sitting on the side of a flat bed without using bedrails?: A Little Help needed moving to and from a bed to a chair (including a wheelchair)?: A Little Help needed standing up from a chair using your arms (e.g., wheelchair or bedside chair)?: A Little Help needed to walk in hospital room?: A Little Help needed climbing 3-5 steps with a railing? : A Little 6 Click Score: 18    End of Session Equipment Utilized During Treatment: Gait belt Activity Tolerance: Patient tolerated treatment well Patient left: in chair;with call bell/phone within reach;with chair alarm set;with family/visitor present Nurse Communication: Mobility status PT Visit Diagnosis: Unsteadiness on feet (R26.81);Pain Pain - part of body:  (face)    Time: 4287-6811 PT Time Calculation (min) (ACUTE ONLY): 20 min   Charges:   PT Evaluation $PT Eval Moderate Complexity: 1 Mod          Rolinda Roan, PT, DPT Acute Rehabilitation Services Secure Chat Preferred Office: [phone]   Thelma Comp 12/20/2021, 2:21 PM

## 2021-12-20 NOTE — Progress Notes (Signed)
PROGRESS NOTE    Jose Beck  ATF:[id number] DOB: 29-Apr-1946 DOA: 12/19/2021 PCP: [name], MD    Brief Narrative:  76 year old with history of stroke with right-sided residual weakness and mild aphasia, coronary artery disease status post stenting on Plavix, chronic heart failure with reduced ejection fraction, mild aortic stenosis, AAA repair, type 2 diabetes, hyperlipidemia and recently diagnosed with prostate cancer brought to ER after falling and hitting right side of the head.  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  Previous fall last year with right shoulder fracture.  In the emergency room blood pressure elevated.  Found to have intracranial hemorrhage and admitted to the hospital with neurosurgery consultation.   Assessment & Plan:   Traumatic acute subarachnoid hemorrhage and acute subdural hematoma: Neurologically stable today. Repeat CT scan in 6 hours with some spreading hematoma without new neurological deficit.  Likely expected. Prophylactic Keppra for seizure prophylaxis. Blood pressure control with carvedilol and losartan, nimodipine. Followed by trauma neurosurgery. Plavix on hold, patient was given 1 unit of platelets. Neurochecks and vital signs. Mobilize with PT/OT and work with speech therapy today.  Fall, less likely syncope or cardiac arrhythmia: Followed by cardiology.  Recommend echocardiogram today.  Essential hypertension, uncontrolled: Resume Coreg and losartan.  Added new multipin.  Blood pressures are acceptable today.  IDDM with hyperglycemia: Lantus 20 units daily continued, continue sliding scale insulin.  Chronic systolic heart failure: Euvolemic.  Metastatic prostate cancer: Outpatient follow-up.   DVT prophylaxis: SCD's Start: 12/19/21 1649   Code Status: Full code Family Communication: Wife at the bedside Disposition Plan: Status is: Inpatient Remains inpatient  appropriate because: Traumatic subdural hematoma, neurochecks, mobility.     Consultants:  Neurosurgery Trauma surgery   Procedures:  None  Antimicrobials:  None   Subjective: Patient seen and examined.  Wife at the bedside.  Did not sleep well but does not have any other particular complaints. Pain is controlled. Denies any difficulty swallowing.  Wife reported some baseline swallowing difficulty due to previous stroke. Was wondering about going home.  Objective: Vitals:   12/19/21 2123 12/19/21 2203 12/20/21 0500 12/20/21 0735  BP:  (!) 148/105 134/70 133/70  Pulse:  84 68 67  Resp:  18  18  Temp:  98.5 F (36.9 C) 98.4 F (36.9 C) (!) 97.3 F (36.3 C)  TempSrc:  Oral Oral Oral  SpO2:  100% 99% 99%  Weight: 108.9 kg     Height: '5\' 7"'$  (1.702 m)       Intake/Output Summary (Last 24 hours) at 12/20/2021 1151 Last data filed at 12/19/2021 1914 Gross per 24 hour  Intake 340 ml  Output --  Net 340 ml   Filed Weights   12/19/21 2123  Weight: 108.9 kg    Examination:  General exam: Appears calm and comfortable  Respiratory system: Clear to auscultation. Respiratory effort normal. Cardiovascular system: S1 & S2 heard, RRR. No pedal edema. Gastrointestinal system: Soft.  Nontender.  Bowel sound present.   Central nervous system: Alert and oriented. No focal neurological deficits.  He has decreased motor power on the right side as compared to left. Skin: Large periorbital hematoma on the right side.  Ecchymosis and hematoma right side of the neck. Psychiatry: Judgement and insight appear normal. Mood & affect appropriate.     Data Reviewed: I have personally reviewed following labs and imaging studies  CBC: Recent Labs  Lab 12/19/21 1333 12/20/21 0247  WBC 4.6 6.0  NEUTROABS 3.4  --   HGB 13.3 12.7*  HCT 38.6* 35.7*  MCV 98.0 94.4  PLT 107* 124*   Basic Metabolic Panel: Recent Labs  Lab 12/19/21 1333 12/19/21 1348  NA 133*  --   K 4.5  --   CL  100  --   CO2 24  --   GLUCOSE 181*  --   BUN 21  --   CREATININE 1.16  --   CALCIUM 9.1  --   MG  --  2.0  PHOS  --  3.0   GFR: Estimated Creatinine Clearance: 64.8 mL/min (by C-G formula based on SCr of 1.16 mg/dL). Liver Function Tests: Recent Labs  Lab 12/19/21 1333  AST 21  ALT 18  ALKPHOS 45  BILITOT 0.9  PROT 6.8  ALBUMIN 3.9   No results for input(s): "LIPASE", "AMYLASE" in the last 168 hours. No results for input(s): "AMMONIA" in the last 168 hours. Coagulation Profile: Recent Labs  Lab 12/19/21 1333  INR 1.1   Cardiac Enzymes: No results for input(s): "CKTOTAL", "CKMB", "CKMBINDEX", "TROPONINI" in the last 168 hours. BNP (last 3 results) No results for input(s): "PROBNP" in the last 8760 hours. HbA1C: Recent Labs    12/19/21 1333  HGBA1C 5.4   CBG: Recent Labs  Lab 12/19/21 1955 12/20/21 0635 12/20/21 1139  GLUCAP 153* 109* 131*   Lipid Profile: No results for input(s): "CHOL", "HDL", "LDLCALC", "TRIG", "CHOLHDL", "LDLDIRECT" in the last 72 hours. Thyroid Function Tests: No results for input(s): "TSH", "T4TOTAL", "FREET4", "T3FREE", "THYROIDAB" in the last 72 hours. Anemia Panel: No results for input(s): "VITAMINB12", "FOLATE", "FERRITIN", "TIBC", "IRON", "RETICCTPCT" in the last 72 hours. Sepsis Labs: No results for input(s): "PROCALCITON", "LATICACIDVEN" in the last 168 hours.  No results found for this or any previous visit (from the past 240 hour(s)).       Radiology Studies: CT Head Wo Contrast  Addendum Date: 12/19/2021   ADDENDUM REPORT: 12/19/2021 21:08 ADDENDUM: These results were called by telephone at the time of interpretation on 12/19/2021 at 9:07 pm to provider Dr. Melany Guernsey, who verbally acknowledged these results. Electronically Signed   By: Iven Finn M.D.   On: 12/19/2021 21:08   Result Date: 12/19/2021 CLINICAL DATA:  CT Head 6 hour follow-up CT Head Level 2 Trauma; Fall on blood thinners EXAM: CT HEAD WITHOUT CONTRAST  TECHNIQUE: Contiguous axial images were obtained from the base of the skull through the vertex without intravenous contrast. RADIATION DOSE REDUCTION: This exam was performed according to the departmental dose-optimization program which includes automated exposure control, adjustment of the mA and/or kV according to patient size and/or use of iterative reconstruction technique. COMPARISON:  CT head 12/19/2021 1:59 p.m. FINDINGS: Brain: No evidence of large-territorial acute infarction. No parenchymal hemorrhage. No mass lesion. Gross Similar-appearing small volume right temporal and anterior right frontal subarachnoid hemorrhage. Interval blooming of a now 5 mm left basal ganglia/insular region hyperdensity suggestive of intraparenchymal bleed. Query developing intraparenchymal right frontal lobe hematoma (3:21) measuring up to 4 mm. No mass effect or midline shift. No hydrocephalus. Basilar cisterns are patent. Vascular: No hyperdense vessel. Atherosclerotic calcifications are present within the cavernous internal carotid arteries. Skull: No acute fracture or focal lesion. Sinuses/Orbits: Bilateral frontal and ethmoid mucosal thickening. Paranasal sinuses and mastoid air cells are clear. Bilateral lens replacement. The orbits are unremarkable. Other: Increase in size right frontal scalp hematoma formation measuring up to 1.7 cm. Interval development of a marked right periorbital hematoma. IMPRESSION:  1. Interval development of a 5 mm left basal ganglia/insular region intraparenchymal hemorrhage. 2. Query developing 4 mm right frontal intraparenchymal hematoma. 3. Grossly stable small volume right temporal and anterior right frontal subarachnoid hemorrhage. 4. Increasing in size right frontal scalp hematoma formation measuring up to 1.7 cm with interval development of marked right periorbital hematoma. Electronically Signed: By: Iven Finn M.D. On: 12/19/2021 20:59   CT CHEST W CONTRAST  Result Date:  12/19/2021 CLINICAL DATA:  Chest trauma, blunt fall, complaining of right sided chest pain. Fall EXAM: CT CHEST WITH CONTRAST TECHNIQUE: Multidetector CT imaging of the chest was performed during intravenous contrast administration. RADIATION DOSE REDUCTION: This exam was performed according to the departmental dose-optimization program which includes automated exposure control, adjustment of the mA and/or kV according to patient size and/or use of iterative reconstruction technique. CONTRAST:  27m OMNIPAQUE IOHEXOL 300 MG/ML  SOLN COMPARISON:  11/02/2017 FINDINGS: Cardiovascular: Diffuse coronary artery and aortic calcifications. Heart is normal size. Aorta is normal caliber. Mediastinum/Nodes: No mediastinal, hilar, or axillary adenopathy. Trachea and esophagus are unremarkable. Thyroid unremarkable. Lungs/Pleura: Lungs are clear. No focal airspace opacities or suspicious nodules. No effusions. No pneumothorax Upper Abdomen: Cirrhotic liver. No acute abnormality. Moderate-sized hiatal hernia. Musculoskeletal: No acute bony abnormality. Chest wall soft tissues are unremarkable. IMPRESSION: No acute cardiopulmonary disease. Diffuse coronary artery disease. Moderate-sized hiatal hernia. Aortic Atherosclerosis (ICD10-I70.0). Electronically Signed   By: KRolm BaptiseM.D.   On: 12/19/2021 17:24   DG Forearm Right  Result Date: 12/19/2021 CLINICAL DATA:  Patient on blood thinners. Level III fall. Contusion on the RIGHT side of the forehead. EXAM: RIGHT FOREARM - 2 VIEW COMPARISON:  None Available. FINDINGS: There is no evidence of fracture or other focal bone lesions. Soft tissues are unremarkable. IMPRESSION: Negative. Electronically Signed   By: ENolon NationsM.D.   On: 12/19/2021 14:47   DG Hand Complete Right  Result Date: 12/19/2021 CLINICAL DATA:  Patient on blood thinners. Level III fall. Contusion on the RIGHT side of the forehead. EXAM: RIGHT HAND - COMPLETE 3+ VIEW COMPARISON:  None Available.  FINDINGS: Pulse oximeter overlies the third digit. There is no acute fracture or subluxation. No radiopaque foreign body or soft tissue gas. IMPRESSION: Negative. Electronically Signed   By: ENolon NationsM.D.   On: 12/19/2021 14:46   DG Shoulder Right  Result Date: 12/19/2021 CLINICAL DATA:  Patient on blood thinners. Level III fall. Contusion on the RIGHT side of the forehead. EXAM: RIGHT SHOULDER - 2+ VIEW COMPARISON:  Chest x-ray 11/25/2017 FINDINGS: There is an acute fracture of the RIGHT humeral neck, with overriding of fracture fragments and varus angulation. Humeral head is located in the glenoid fossa. IMPRESSION: Acute fracture of the RIGHT humeral neck. Electronically Signed   By: ENolon NationsM.D.   On: 12/19/2021 14:45   CT Maxillofacial Wo Contrast  Result Date: 12/19/2021 CLINICAL DATA:  Facial trauma, blunt. Fall. Patient is on blood thinners. EXAM: CT MAXILLOFACIAL WITHOUT CONTRAST TECHNIQUE: Multidetector CT imaging of the maxillofacial structures was performed. Multiplanar CT image reconstructions were also generated. RADIATION DOSE REDUCTION: This exam was performed according to the departmental dose-optimization program which includes automated exposure control, adjustment of the mA and/or kV according to patient size and/or use of iterative reconstruction technique. COMPARISON:  No acute fractures are present. Mandible is intact and located. FINDINGS: Osseous: No acute fracture is present. The mandible is intact and located. Orbits: Bilateral lens replacements are noted. Globes and orbits are otherwise unremarkable.  Sinuses: Mucosal thickening is present the anterior ethmoid air cells and inferior frontal sinuses without fluid levels. Soft tissues: Large right periorbital and temporal hematoma is present without underlying fracture or radiopaque foreign body. No other focal soft tissue injury is present. Limited intracranial: Subarachnoid and subdural blood is described in detail  on CT head report of the same day. IMPRESSION: 1. Large right periorbital and temporal hematoma without underlying fracture or radiopaque foreign body. 2. No other acute trauma to the face. 3. Subarachnoid and subdural blood is described in detail on CT head report of the same day. Critical Value/emergent results were called by telephone at the time of interpretation on 12/19/2021 at 2:19pm to provider [facility] , who verbally acknowledged these results. Electronically Signed   By: San Morelle M.D.   On: 12/19/2021 14:44   DG Wrist Complete Right  Result Date: 12/19/2021 CLINICAL DATA:  Patient on blood thinners. Level III fall. Contusion on the RIGHT side of the forehead. EXAM: RIGHT WRIST - COMPLETE 3+ VIEW COMPARISON:  None Available. FINDINGS: There is no evidence of fracture or dislocation. There is no evidence of arthropathy or other focal bone abnormality. Soft tissues are unremarkable. IMPRESSION: Negative. Electronically Signed   By: Nolon Nations M.D.   On: 12/19/2021 14:43   CT Cervical Spine Wo Contrast  Result Date: 12/19/2021 CLINICAL DATA:  Fall.  Patient is on blood thinners. EXAM: CT CERVICAL SPINE WITHOUT CONTRAST TECHNIQUE: Multidetector CT imaging of the cervical spine was performed without intravenous contrast. Multiplanar CT image reconstructions were also generated. RADIATION DOSE REDUCTION: This exam was performed according to the departmental dose-optimization program which includes automated exposure control, adjustment of the mA and/or kV according to patient size and/or use of iterative reconstruction technique. COMPARISON:  None Available. FINDINGS: Alignment: No significant listhesis is present. Cervical lordosis is preserved. Skull base and vertebrae: Craniocervical junction is within normal limits. Vertebral body heights are normal. No acute fractures are present. Ossification along the posterior longitudinal ligament is likely related to remote trauma. Soft  tissues and spinal canal: No prevertebral fluid or swelling. No visible canal hematoma. Atherosclerotic calcifications are present within the carotid arteries bilaterally, left greater than right. Disc levels:  No significant osseous stenosis is evident. Upper chest: The lung apices are clear. Thoracic inlet is within normal limits. IMPRESSION: 1. No acute fracture or traumatic subluxation. 2. Ossification along the posterior longitudinal ligament is likely related to remote trauma. 3. Atherosclerosis. These results were called by telephone at the time of interpretation on 12/19/2021 at 2:19pm to provider [facility] , who verbally acknowledged these results. Electronically Signed   By: San Morelle M.D.   On: 12/19/2021 14:38   CT Head Wo Contrast  Result Date: 12/19/2021 CLINICAL DATA:  Level 2 trauma. Blunt. Fall. Patient is on blood thinners EXAM: CT HEAD WITHOUT CONTRAST TECHNIQUE: Contiguous axial images were obtained from the base of the skull through the vertex without intravenous contrast. RADIATION DOSE REDUCTION: This exam was performed according to the departmental dose-optimization program which includes automated exposure control, adjustment of the mA and/or kV according to patient size and/or use of iterative reconstruction technique. COMPARISON:  CT head without contrast 07/21/17 FINDINGS: Brain: Acute subarachnoid hemorrhage is present the posterior right temporal lobe in more anteriorly over the right frontal lobe. A thin subdural hemorrhage is also present adjacent to the right temporal tip in the right middle cranial fossa. Remote cerebellar infarcts are again seen, right greater than left. No acute ischemic infarct  is present. Moderate generalized atrophy is similar the prior exam. The ventricles are proportionate to the degree of atrophy. Vascular: Dense atherosclerotic calcifications are present within the cavernous internal carotid arteries. No hyperdense vessel is present.  Skull: A large right periorbital and temporal hematoma is present. No underlying fracture is present. Calvarium is intact. No foreign body is present. No other focal extracranial soft tissue injury is present. Sinuses/Orbits: Anterior ethmoid air cells are opacified. Extensive mucosal thickening is present in the frontal sinuses bilaterally. The paranasal sinuses and mastoid air cells are otherwise clear. Bilateral lens replacements are noted. Globes and orbits are otherwise unremarkable. IMPRESSION: 1. Acute subarachnoid hemorrhage involving the posterior right temporal lobe and anterior right frontal lobe. 2. Thin subdural hemorrhage adjacent to the right temporal tip in the right middle cranial fossa. 3. Large right periorbital and temporal hematoma without underlying fracture. 4. Remote cerebellar infarcts, right greater than left. 5. Stable atrophy and white matter disease. Critical Value/emergent results were called by telephone at the time of interpretation on 12/19/2021 at 2:19 pm to provider [facility] , who verbally acknowledged these results. Electronically Signed   By: San Morelle M.D.   On: 12/19/2021 14:34   DG Chest Portable 1 View  Result Date: 12/19/2021 CLINICAL DATA:  Trauma, fall EXAM: PORTABLE CHEST 1 VIEW COMPARISON:  11/25/2017 FINDINGS: The heart size and mediastinal contours are within normal limits. Both lungs are clear. There is deformity in the right shoulder with possible fracture or dislocation. Right shoulder is not adequately visualized for evaluation. IMPRESSION: There are no focal pulmonary infiltrates. There is no pleural effusion or pneumothorax. There is deformity in right shoulder suggesting possible fracture dislocation. Right shoulder is not included in its entirety limiting evaluation. Routine radiographs of right shoulder is recommended for further evaluation. Electronically Signed   By: Elmer Picker M.D.   On: 12/19/2021 14:03   DG Pelvis  Portable  Result Date: 12/19/2021 CLINICAL DATA:  Trauma, fall EXAM: PORTABLE PELVIS 1-2 VIEWS COMPARISON:  None Available. FINDINGS: No displaced fracture or dislocation is seen. Arterial calcifications are seen in soft tissues. Vascular stents are noted in the courses of iliac arteries. IMPRESSION: No recent fracture or dislocation is seen. Electronically Signed   By: Elmer Picker M.D.   On: 12/19/2021 13:58        Scheduled Meds:  atorvastatin  80 mg Oral q1800   carvedilol  25 mg Oral BID WC   insulin aspart  0-15 Units Subcutaneous TID WC   insulin detemir  20 Units Subcutaneous q morning   losartan  25 mg Oral Daily   niMODipine  30 mg Oral Q4H   Continuous Infusions:  levETIRAcetam 500 mg (12/20/21 0547)     LOS: 1 day    Time spent: 35 minutes    Barb Merino, MD [facility] Pager [phone]

## 2021-12-20 NOTE — Evaluation (Signed)
Speech Language Pathology Evaluation Patient Details Name: Jose Beck MRN: [id number] DOB: 12-19-1945 Today's Date: 12/20/2021 Time: 8527-7824 SLP Time Calculation (min) (ACUTE ONLY): 23 min  Problem List:  Patient Active Problem List   Diagnosis Date Noted   SAH (subarachnoid hemorrhage) ([place]) 12/19/2021   Cerebrovascular accident ([place]) 01/04/2018   Compression fracture of thoracic vertebra ([place]) 01/04/2018   AAA (abdominal aortic aneurysm) ([place]) 11/25/2017   History of stroke 01/15/2017   Hyperlipidemia LDL goal <70 04/18/2016   Hypertension    Hypertensive heart disease    Chest pain 11/16/2015   Type 2 diabetes mellitus with circulatory disorder, with long-term current use of insulin ([place]) 11/16/2015   Essential hypertension 11/16/2015   Thrombocytopenia ([place]) 11/16/2015   Leukopenia 11/16/2015   Hyponatremia 11/16/2015   Chronic combined systolic and diastolic CHF (congestive heart failure) ([place]) 11/16/2015   CAD in native artery    Brainstem infarct, acute ([place]) 09/17/2015   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 11/2015 Echo: EF 55-60%, Gr1 DD, mild AS.   Chronic combined systolic and diastolic CHF (congestive heart failure) ([place])    a. 07/2015: TEE showing EF of 40% b. 11/2015: Echo w/ EF 55-60%, Grade 1 DD, mild AS.   Coronary artery disease    a. patient reports 100% stenosis of LAD and RCA by cath in 1999. b. 11/2015 Cath: LM nl, LAD 100 - fills by L->L collats from D1, LCX 60d, OM1 lalrge/nl, OM2 small/nl, RCA 162m L->R collats.EF 35-45% (55-60% by echo).   Diabetes mellitus without complication ([place]    Type II   GERD (gastroesophageal  reflux disease)    Hyperlipemia    Hypertension    Hypertensive heart disease    Stroke ([place]    a. 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   Thrombocytopenia ([place] 11/2015   Past Surgical History:  Past Surgical History:  Procedure Laterality Date   ABDOMINAL AORTIC ENDOVASCULAR STENT GRAFT N/A 11/25/2017   Procedure: ABDOMINAL AORTIC ENDOVASCULAR STENT GRAFT;  Surgeon: BSerafina Mitchell MD;  Location: [facility];  Service: Vascular;  Laterality: N/A;   CARDIAC CATHETERIZATION     CARDIAC CATHETERIZATION N/A 11/16/2015   Procedure: Left Heart Cath and Coronary Angiography;  Surgeon: HBelva Crome MD;  [facility];  Service: Cardiovascular;  Laterality: N/A;   MOLE REMOVAL     TONSILLECTOMY     HPI:  Pt is a 76y.o. who presented with a fall and questionable syncope. CT head (8/10) revealed acute subarachnoid hemorrhage involving the right temporal and frontal lobe, thin subdural hemorrhage, large right periorbital and temporal hematoma, remote cerebellar infarcts. Received OP SLP services in 2017 for treatment of dysarthria and cognition. PMH: stroke with right-sided residual weakness and aphasia, CAD status post stenting, chronic HFrEF, HTN, mild aortic stenosis, AAA status post repair, IDDM, HLD, recently diagnosed metastatic prostate cancer.   Assessment / Plan / Recommendation Clinical Impression  Pt with hx of dysarthria and cognitive communication deficits, presents with no changes from baseline per wife/pt report. Wife reports that pt received OP SLP services in 2017 post CVA and that he still does daily exercises/activities to improve his speech and memory.  Pt's speech at the simple conversation level is 50% intelligible and marked by reduced articulatory precision and low vocal intensity. He was noted to continue speaking on residual air, further reducing intelligiblity. With cues for slow rate, over articulation and pausing, speech intelligibility improved to 60-70% at  the sentence level. He is oriented x4, but presents with poor delayed recall of listed items (2/5) and executive functions. Receptive and expressive language appeared [facility] for tasks assessed. Given reports of baseline function, no further SLP services warranted acutely. He would beneift from f/u services at next level of care if desired. Will s/o.   Screened for swallow function with pt and wife reporting no difficulties during am meal. He passed yale 8/11 and RN without concerns for dysphagia at this time.       SLP Assessment  SLP Recommendation/Assessment: All further Speech Lanaguage Pathology  needs can be addressed in the next venue of care SLP Visit Diagnosis: Dysarthria and anarthria (R47.1);Cognitive communication deficit (R41.841)    Recommendations for follow up therapy are one component of a multi-disciplinary discharge planning process, led by the attending physician.  Recommendations may be updated based on patient status, additional functional criteria and insurance authorization.    Follow Up Recommendations  Home health SLP    Assistance Recommended at Discharge  Frequent or constant Supervision/Assistance  Functional Status Assessment Patient has not had a recent decline in their functional status  Frequency and Duration           SLP Evaluation Cognition  Overall Cognitive Status: History of cognitive impairments - at baseline Arousal/Alertness: Awake/alert Orientation Level: Oriented X4 Year: 2023 Month: August Day of Week: Correct Attention: Sustained Sustained Attention: Appears intact Memory: Impaired Memory Impairment: Retrieval deficit;Decreased recall of new information       Comprehension  Auditory Comprehension Overall Auditory Comprehension: Appears within functional limits for tasks assessed Yes/No Questions: Not tested Commands: Within Functional Limits Conversation: Simple Visual Recognition/Discrimination Discrimination: Not tested Reading  Comprehension Reading Status: Within funtional limits    Expression Expression Primary Mode of Expression: Verbal Verbal Expression Overall Verbal Expression: Appears within functional limits for tasks assessed Initiation: No impairment Automatic Speech: Name;Social Response Level of Generative/Spontaneous Verbalization: Sentence Naming: No impairment Written Expression Dominant Hand: Right Written Expression: Not tested   Oral / Motor  Oral Motor/Sensory Function Overall Oral Motor/Sensory Function: Moderate impairment Motor Speech Overall Motor Speech: Impaired at baseline Respiration: Impaired Level of Impairment: Conversation Phonation: Normal Resonance: Within functional limits Articulation: Impaired Level of Impairment: Sentence Intelligibility: Intelligibility reduced Word: 75-100% accurate Phrase: 50-74% accurate Sentence: 50-74% accurate Conversation: 25-49% accurate             Ellwood Dense, MA, [place] Office Number: [phone]  Acie Fredrickson 12/20/2021, 12:50 PM

## 2021-12-20 NOTE — Progress Notes (Signed)
Providing Compassionate, Quality Care - Together   Subjective: Patient reports no new issues. His wife is at the bedside. She reports the patient has ambulated with therapies.  Objective: Vital signs in last 24 hours: Temp:  [97.3 F (36.3 C)-98.5 F (36.9 C)] 97.3 F (36.3 C) (08/11 0735) Pulse Rate:  [46-96] 67 (08/11 0735) Resp:  [14-32] 18 (08/11 0735) BP: (133-180)/(70-105) 133/70 (08/11 0735) SpO2:  [96 %-100 %] 99 % (08/11 0735) Weight:  [108.9 kg] 108.9 kg (08/10 2123)  Intake/Output from previous day: 08/10 0701 - 08/11 0700 In: 340 [Blood:340] Out: -  Intake/Output this shift: No intake/output data recorded.  Patient is alert and pleasant Significant right periorbital edema and ecchymosis PERRLA Mildly dysarthric (baseline) MAE, with baseline right sided weakness Unsteady gait  Lab Results: Recent Labs    12/19/21 1333 12/20/21 0247  WBC 4.6 6.0  HGB 13.3 12.7*  HCT 38.6* 35.7*  PLT 107* 107*   BMET Recent Labs    12/19/21 1333  NA 133*  K 4.5  CL 100  CO2 24  GLUCOSE 181*  BUN 21  CREATININE 1.16  CALCIUM 9.1    Studies/Results: CT Head Wo Contrast  Addendum Date: 12/19/2021   ADDENDUM REPORT: 12/19/2021 21:08 ADDENDUM: These results were called by telephone at the time of interpretation on 12/19/2021 at 9:07 pm to provider Dr. Melany Guernsey, who verbally acknowledged these results. Electronically Signed   By: Iven Finn M.D.   On: 12/19/2021 21:08   Result Date: 12/19/2021 CLINICAL DATA:  CT Head 6 hour follow-up CT Head Level 2 Trauma; Fall on blood thinners EXAM: CT HEAD WITHOUT CONTRAST TECHNIQUE: Contiguous axial images were obtained from the base of the skull through the vertex without intravenous contrast. RADIATION DOSE REDUCTION: This exam was performed according to the departmental dose-optimization program which includes automated exposure control, adjustment of the mA and/or kV according to patient size and/or use of iterative  reconstruction technique. COMPARISON:  CT head 12/19/2021 1:59 p.m. FINDINGS: Brain: No evidence of large-territorial acute infarction. No parenchymal hemorrhage. No mass lesion. Gross Similar-appearing small volume right temporal and anterior right frontal subarachnoid hemorrhage. Interval blooming of a now 5 mm left basal ganglia/insular region hyperdensity suggestive of intraparenchymal bleed. Query developing intraparenchymal right frontal lobe hematoma (3:21) measuring up to 4 mm. No mass effect or midline shift. No hydrocephalus. Basilar cisterns are patent. Vascular: No hyperdense vessel. Atherosclerotic calcifications are present within the cavernous internal carotid arteries. Skull: No acute fracture or focal lesion. Sinuses/Orbits: Bilateral frontal and ethmoid mucosal thickening. Paranasal sinuses and mastoid air cells are clear. Bilateral lens replacement. The orbits are unremarkable. Other: Increase in size right frontal scalp hematoma formation measuring up to 1.7 cm. Interval development of a marked right periorbital hematoma. IMPRESSION: 1. Interval development of a 5 mm left basal ganglia/insular region intraparenchymal hemorrhage. 2. Query developing 4 mm right frontal intraparenchymal hematoma. 3. Grossly stable small volume right temporal and anterior right frontal subarachnoid hemorrhage. 4. Increasing in size right frontal scalp hematoma formation measuring up to 1.7 cm with interval development of marked right periorbital hematoma. Electronically Signed: By: Iven Finn M.D. On: 12/19/2021 20:59   CT CHEST W CONTRAST  Result Date: 12/19/2021 CLINICAL DATA:  Chest trauma, blunt fall, complaining of right sided chest pain. Fall EXAM: CT CHEST WITH CONTRAST TECHNIQUE: Multidetector CT imaging of the chest was performed during intravenous contrast administration. RADIATION DOSE REDUCTION: This exam was performed according to the departmental dose-optimization program which includes  automated  exposure control, adjustment of the mA and/or kV according to patient size and/or use of iterative reconstruction technique. CONTRAST:  59m OMNIPAQUE IOHEXOL 300 MG/ML  SOLN COMPARISON:  11/02/2017 FINDINGS: Cardiovascular: Diffuse coronary artery and aortic calcifications. Heart is normal size. Aorta is normal caliber. Mediastinum/Nodes: No mediastinal, hilar, or axillary adenopathy. Trachea and esophagus are unremarkable. Thyroid unremarkable. Lungs/Pleura: Lungs are clear. No focal airspace opacities or suspicious nodules. No effusions. No pneumothorax Upper Abdomen: Cirrhotic liver. No acute abnormality. Moderate-sized hiatal hernia. Musculoskeletal: No acute bony abnormality. Chest wall soft tissues are unremarkable. IMPRESSION: No acute cardiopulmonary disease. Diffuse coronary artery disease. Moderate-sized hiatal hernia. Aortic Atherosclerosis (ICD10-I70.0). Electronically Signed   By: KRolm BaptiseM.D.   On: 12/19/2021 17:24   DG Forearm Right  Result Date: 12/19/2021 CLINICAL DATA:  Patient on blood thinners. Level III fall. Contusion on the RIGHT side of the forehead. EXAM: RIGHT FOREARM - 2 VIEW COMPARISON:  None Available. FINDINGS: There is no evidence of fracture or other focal bone lesions. Soft tissues are unremarkable. IMPRESSION: Negative. Electronically Signed   By: ENolon NationsM.D.   On: 12/19/2021 14:47   DG Hand Complete Right  Result Date: 12/19/2021 CLINICAL DATA:  Patient on blood thinners. Level III fall. Contusion on the RIGHT side of the forehead. EXAM: RIGHT HAND - COMPLETE 3+ VIEW COMPARISON:  None Available. FINDINGS: Pulse oximeter overlies the third digit. There is no acute fracture or subluxation. No radiopaque foreign body or soft tissue gas. IMPRESSION: Negative. Electronically Signed   By: ENolon NationsM.D.   On: 12/19/2021 14:46   DG Shoulder Right  Result Date: 12/19/2021 CLINICAL DATA:  Patient on blood thinners. Level III fall. Contusion on  the RIGHT side of the forehead. EXAM: RIGHT SHOULDER - 2+ VIEW COMPARISON:  Chest x-ray 11/25/2017 FINDINGS: There is an acute fracture of the RIGHT humeral neck, with overriding of fracture fragments and varus angulation. Humeral head is located in the glenoid fossa. IMPRESSION: Acute fracture of the RIGHT humeral neck. Electronically Signed   By: ENolon NationsM.D.   On: 12/19/2021 14:45   CT Maxillofacial Wo Contrast  Result Date: 12/19/2021 CLINICAL DATA:  Facial trauma, blunt. Fall. Patient is on blood thinners. EXAM: CT MAXILLOFACIAL WITHOUT CONTRAST TECHNIQUE: Multidetector CT imaging of the maxillofacial structures was performed. Multiplanar CT image reconstructions were also generated. RADIATION DOSE REDUCTION: This exam was performed according to the departmental dose-optimization program which includes automated exposure control, adjustment of the mA and/or kV according to patient size and/or use of iterative reconstruction technique. COMPARISON:  No acute fractures are present. Mandible is intact and located. FINDINGS: Osseous: No acute fracture is present. The mandible is intact and located. Orbits: Bilateral lens replacements are noted. Globes and orbits are otherwise unremarkable. Sinuses: Mucosal thickening is present the anterior ethmoid air cells and inferior frontal sinuses without fluid levels. Soft tissues: Large right periorbital and temporal hematoma is present without underlying fracture or radiopaque foreign body. No other focal soft tissue injury is present. Limited intracranial: Subarachnoid and subdural blood is described in detail on CT head report of the same day. IMPRESSION: 1. Large right periorbital and temporal hematoma without underlying fracture or radiopaque foreign body. 2. No other acute trauma to the face. 3. Subarachnoid and subdural blood is described in detail on CT head report of the same day. Critical Value/emergent results were called by telephone at the time of  interpretation on 12/19/2021 at 2:19pm [facility], who verbally acknowledged these  results. Electronically Signed   By: San Morelle M.D.   On: 12/19/2021 14:44   DG Wrist Complete Right  Result Date: 12/19/2021 CLINICAL DATA:  Patient on blood thinners. Level III fall. Contusion on the RIGHT side of the forehead. EXAM: RIGHT WRIST - COMPLETE 3+ VIEW COMPARISON:  None Available. FINDINGS: There is no evidence of fracture or dislocation. There is no evidence of arthropathy or other focal bone abnormality. Soft tissues are unremarkable. IMPRESSION: Negative. Electronically Signed   By: Nolon Nations M.D.   On: 12/19/2021 14:43   CT Cervical Spine Wo Contrast  Result Date: 12/19/2021 CLINICAL DATA:  Fall.  Patient is on blood thinners. EXAM: CT CERVICAL SPINE WITHOUT CONTRAST TECHNIQUE: Multidetector CT imaging of the cervical spine was performed without intravenous contrast. Multiplanar CT image reconstructions were also generated. RADIATION DOSE REDUCTION: This exam was performed according to the departmental dose-optimization program which includes automated exposure control, adjustment of the mA and/or kV according to patient size and/or use of iterative reconstruction technique. COMPARISON:  None Available. FINDINGS: Alignment: No significant listhesis is present. Cervical lordosis is preserved. Skull base and vertebrae: Craniocervical junction is within normal limits. Vertebral body heights are normal. No acute fractures are present. Ossification along the posterior longitudinal ligament is likely related to remote trauma. Soft tissues and spinal canal: No prevertebral fluid or swelling. No visible canal hematoma. Atherosclerotic calcifications are present within the carotid arteries bilaterally, left greater than right. Disc levels:  No significant osseous stenosis is evident. Upper chest: The lung apices are clear. Thoracic inlet is within normal limits. IMPRESSION: 1. No  acute fracture or traumatic subluxation. 2. Ossification along the posterior longitudinal ligament is likely related to remote trauma. 3. Atherosclerosis. These results were called by telephone at the time of interpretation on 12/19/2021 at 2:19pm to provider [facility] , who verbally acknowledged these results. Electronically Signed   By: San Morelle M.D.   On: 12/19/2021 14:38   CT Head Wo Contrast  Result Date: 12/19/2021 CLINICAL DATA:  Level 2 trauma. Blunt. Fall. Patient is on blood thinners EXAM: CT HEAD WITHOUT CONTRAST TECHNIQUE: Contiguous axial images were obtained from the base of the skull through the vertex without intravenous contrast. RADIATION DOSE REDUCTION: This exam was performed according to the departmental dose-optimization program which includes automated exposure control, adjustment of the mA and/or kV according to patient size and/or use of iterative reconstruction technique. COMPARISON:  CT head without contrast 07/21/17 FINDINGS: Brain: Acute subarachnoid hemorrhage is present the posterior right temporal lobe in more anteriorly over the right frontal lobe. A thin subdural hemorrhage is also present adjacent to the right temporal tip in the right middle cranial fossa. Remote cerebellar infarcts are again seen, right greater than left. No acute ischemic infarct is present. Moderate generalized atrophy is similar the prior exam. The ventricles are proportionate to the degree of atrophy. Vascular: Dense atherosclerotic calcifications are present within the cavernous internal carotid arteries. No hyperdense vessel is present. Skull: A large right periorbital and temporal hematoma is present. No underlying fracture is present. Calvarium is intact. No foreign body is present. No other focal extracranial soft tissue injury is present. Sinuses/Orbits: Anterior ethmoid air cells are opacified. Extensive mucosal thickening is present in the frontal sinuses bilaterally. The paranasal  sinuses and mastoid air cells are otherwise clear. Bilateral lens replacements are noted. Globes and orbits are otherwise unremarkable. IMPRESSION: 1. Acute subarachnoid hemorrhage involving the posterior right temporal lobe and anterior right frontal lobe. 2. Thin subdural  hemorrhage adjacent to the right temporal tip in the right middle cranial fossa. 3. Large right periorbital and temporal hematoma without underlying fracture. 4. Remote cerebellar infarcts, right greater than left. 5. Stable atrophy and white matter disease. Critical Value/emergent results were called by telephone at the time of interpretation on 12/19/2021 at 2:19 pm to provider [facility] , who verbally acknowledged these results. Electronically Signed   By: San Morelle M.D.   On: 12/19/2021 14:34   DG Chest Portable 1 View  Result Date: 12/19/2021 CLINICAL DATA:  Trauma, fall EXAM: PORTABLE CHEST 1 VIEW COMPARISON:  11/25/2017 FINDINGS: The heart size and mediastinal contours are within normal limits. Both lungs are clear. There is deformity in the right shoulder with possible fracture or dislocation. Right shoulder is not adequately visualized for evaluation. IMPRESSION: There are no focal pulmonary infiltrates. There is no pleural effusion or pneumothorax. There is deformity in right shoulder suggesting possible fracture dislocation. Right shoulder is not included in its entirety limiting evaluation. Routine radiographs of right shoulder is recommended for further evaluation. Electronically Signed   By: Elmer Picker M.D.   On: 12/19/2021 14:03   DG Pelvis Portable  Result Date: 12/19/2021 CLINICAL DATA:  Trauma, fall EXAM: PORTABLE PELVIS 1-2 VIEWS COMPARISON:  None Available. FINDINGS: No displaced fracture or dislocation is seen. Arterial calcifications are seen in soft tissues. Vascular stents are noted in the courses of iliac arteries. IMPRESSION: No recent fracture or dislocation is seen. Electronically  Signed   By: Elmer Picker M.D.   On: 12/19/2021 13:58    Assessment/Plan: Patient fell on 12/19/2021, striking his head. CT revealed small SAH and SDH. Platelets and Keppra were administered in the ED.  Follow up CT head from last night showed the expected evolution of the small SAH and SDH. Patient is at his mental status baseline. No Neurosurgical interventions recommended. No further imaging is necessary unless the patient has a decline in his mental status. Patient is fine to discharge home from a Neurosurgical perspective. Discussion should be had with the prescribing provider regarding the benefits vs the risks of restarting this patient on Plavix. He should remain off of the Plavix for at least one week. Keppra can be discontinued as long as the patient has not has any seizure activity.     LOS: 1 day     Viona Gilmore, DNP, AGNP-C Nurse Practitioner  [facility] [place] [address], [place], [place] [address] P: [phone]    F: [phone]  12/20/2021, 11:28 AM

## 2021-12-20 NOTE — Progress Notes (Signed)
  Echocardiogram 2D Echocardiogram has been performed.  Jose Beck 12/20/2021, 3:42 PM

## 2021-12-20 NOTE — Evaluation (Signed)
Occupational Therapy Evaluation Patient Details Name: Jose Beck MRN: [id number] DOB: 01-11-1946 Today's Date: 12/20/2021   History of Present Illness Pt is a 76 y/o male who presents to [facility] ED 12/19/2021 s/p fall at home, hitting head. 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 There is also a thin subdural hemorrhage adjacent to the right temporal tip in the right middle cranial fossa. No current recommendations per neurosurgery. PMH significant for AAA, CHF, DM II, HTN, prostate cancer, and stroke (residual right-sided weakness and mild expressive aphasia).   Clinical Impression   PTA, pt lived with his wife and was independent with ADL, and most IADLs. Wife present during session and reporting she believes pt is near his baseline. Pt unable to see out of R eye on evaluation, and with decreased visual attention during visual testing of L eye. Pt performing LB ADL and functional mobility with min guard A. Washing face standing at sink with min guard A. Pt with decreased awareness of safety for OOB tasks; poor balance, but not willing to attempt RW; switching cane frequently between R and L hand. Pt presenting with decreased balance, coordination, strength, safety, attention, and awareness. Due to wife report of pt being near his baseline and emphasis on safey in home environment, recommend [place] upon discharge to optimize safety and independence in ADL and IADL.      Recommendations for follow up therapy are one component of a multi-disciplinary discharge planning process, led by the attending physician.  Recommendations may be updated based on patient status, additional functional criteria and insurance authorization.   Follow Up Recommendations  Home health OT    Assistance Recommended at Discharge Frequent or constant Supervision/Assistance  Patient can return home with the following A little help with walking and/or  transfers;A little help with bathing/dressing/bathroom;Assistance with cooking/housework;Direct supervision/assist for medications management;Direct supervision/assist for financial management;Assist for transportation;Help with stairs or ramp for entrance    Functional Status Assessment  Patient has had a recent decline in their functional status and/or demonstrates limited ability to make significant improvements in function in a reasonable and predictable amount of time  Equipment Recommendations  None recommended by OT (Pt has recommended equipment)    Recommendations for Other Services       Precautions / Restrictions Precautions Precautions: Fall Restrictions Weight Bearing Restrictions: No      Mobility Bed Mobility Overal bed mobility: Needs Assistance Bed Mobility: Supine to Sit     Supine to sit: Min guard     General bed mobility comments: Min guard A for safety    Transfers Overall transfer level: Needs assistance Equipment used: Straight cane Transfers: Sit to/from Stand Sit to Stand: Min guard           General transfer comment: Min guard A for safety. Pt very wobbly with initial stand      Balance Overall balance assessment: Needs assistance Sitting-balance support: No upper extremity supported Sitting balance-Leahy Scale: Fair Sitting balance - Comments: Donning socks with min guard with approach of reaching toward feet. No physical assist needed.   Standing balance support: Single extremity supported Standing balance-Leahy Scale: Poor Standing balance comment: Wide BOS and decreased balance standing at sink.                           ADL either performed or assessed with clinical judgement   ADL Overall ADL's : Needs assistance/impaired Eating/Feeding:  Set up;Sitting   Grooming: Wash/dry face;Min guard;Standing Grooming Details (indicate cue type and reason): Pt with decreased attention to task, using half wet wash cloth to wash  face. Pt only using LUE to squeeze additional water out of wash cloth. Upper Body Bathing: Set up;Supervision/ safety;Sitting   Lower Body Bathing: Min guard;Sit to/from stand   Upper Body Dressing : Set up;Sitting   Lower Body Dressing: Min guard Lower Body Dressing Details (indicate cue type and reason): Donning sock sitting EOB with min guard A. Toilet Transfer: Min guard;Ambulation;Comfort height toilet (cane) Toilet Transfer Details (indicate cue type and reason): simulated in room         Functional mobility during ADLs: Min guard;Cane General ADL Comments: Pt with decreased balance and safety awareness.     Vision Ability to See in Adequate Light: 0 Adequate Patient Visual Report: Other (comment) (Cannot see out of R eye) Vision Assessment?: Yes;Vision impaired- to be further tested in functional context Eye Alignment: Impaired (comment) (R eye nearly shut) Tracking/Visual Pursuits: Decreased smoothness of vertical tracking (Pt with mod difficulty maintaining eye on stimulus during visual tracking.) Visual Fields: Other (comment) (L eye WFL; max cues to decrease use of compensatory tecnhiques) Depth Perception: Overshoots (Toward R) Additional Comments: Per pt report, cannot see out of R eye at all at this time. Pt with mod difficulty following commands and decreasing use of compensatory tehniques during visual field testing. Pt frequently losing stimulus with vertical tracking to L and R. Continue to assess.     Perception     Praxis      Pertinent Vitals/Pain Pain Assessment Pain Assessment: Faces Faces Pain Scale: Hurts a little bit Pain Location: R eye. Pt reporting "it doesnt hurt much" Pain Descriptors / Indicators: Discomfort, Aching, Tender Pain Intervention(s): Monitored during session     Hand Dominance Right   Extremity/Trunk Assessment Upper Extremity Assessment Upper Extremity Assessment: RUE deficits/detail;LUE deficits/detail RUE Deficits /  Details: Pt holding RUE in flexed position at all times, however, able to move RUE. Shoulder flexion limited ~20 degrees from prior CVA. Decreaed coordination, however, grip strength and push/pull 4+/5 RUE Coordination: decreased fine motor;decreased gross motor LUE Deficits / Details: Decreased speed and coordination with finger opposition. Pt with decreased strength~4+/5 LUE Coordination: decreased fine motor   Lower Extremity Assessment Lower Extremity Assessment: Defer to PT evaluation   Cervical / Trunk Assessment Cervical / Trunk Assessment: Kyphotic   Communication Communication Communication: Expressive difficulties   Cognition Arousal/Alertness: Awake/alert Behavior During Therapy: Flat affect Overall Cognitive Status: Impaired/Different from baseline Area of Impairment: Following commands, Safety/judgement, Awareness, Problem solving, Attention                   Current Attention Level: Sustained (Pt with difficulty with visual attention during visual testing)   Following Commands: Follows one step commands consistently, Follows one step commands with increased time Safety/Judgement: Decreased awareness of safety, Decreased awareness of deficits (Unwilling to attempt use of RW) Awareness: Emergent Problem Solving: Slow processing, Requires verbal cues General Comments: Pt wife reporting pt seems near his baseline. Pt with decreased safety awareness, awareness, and problem solving this session. Pt following commands with increased time. Demonstrating decreased safety awareness; Pt frequently switching cane between R and L hand during functional mobility     General Comments  Wife present reporting pt is very close to baseline    Exercises     Shoulder Instructions      Home Living Family/patient expects to be discharged to::  Private residence Living Arrangements: Spouse/significant other Available Help at Discharge: Family;Available 24 hours/day Type of Home:  House Home Access: Stairs to enter [employer] of Steps: 2 Entrance Stairs-Rails: None Home Layout: Two level;Able to live on main level with bedroom/bathroom     Bathroom Shower/Tub: Occupational psychologist: Standard     Home Equipment: Conservation officer, nature (2 wheels);Cane - single point;Tub bench          Prior Functioning/Environment Prior Level of Function : Needs assist             Mobility Comments: Using cane. Pt with prior falls as well. ADLs Comments: Pt wife performing most of the cooking and cleaning. Pt independent in ADL, medication management per pt and wife        OT Problem List: Decreased strength;Decreased range of motion;Decreased activity tolerance;Impaired balance (sitting and/or standing);Impaired vision/perception;Decreased coordination;Decreased safety awareness;Decreased knowledge of use of DME or AE;Obesity;Pain      OT Treatment/Interventions: Self-care/ADL training;DME and/or AE instruction;Therapeutic exercise;Therapeutic activities;Cognitive remediation/compensation;Visual/perceptual remediation/compensation;Patient/family education;Balance training    OT Goals(Current goals can be found in the care plan section) Acute Rehab OT Goals Patient Stated Goal: None stated OT Goal Formulation: With patient Time For Goal Achievement: 01/03/22 Potential to Achieve Goals: Fair  OT Frequency: Min 2X/week    Co-evaluation              AM-PAC OT "6 Clicks" Daily Activity     Outcome Measure Help from another person eating meals?: None Help from another person taking care of personal grooming?: A Little Help from another person toileting, which includes using toliet, bedpan, or urinal?: A Little Help from another person bathing (including washing, rinsing, drying)?: A Little Help from another person to put on and taking off regular upper body clothing?: A Little Help from another person to put on and taking off regular lower body  clothing?: A Little 6 Click Score: 19   End of Session Equipment Utilized During Treatment: Gait belt (cane) Nurse Communication: Mobility status;Other (comment) (RUE functionality)  Activity Tolerance: Patient tolerated treatment well Patient left: Other (comment) (Handoff with PT)  OT Visit Diagnosis: Unsteadiness on feet (R26.81);History of falling (Z91.81);Muscle weakness (generalized) (M62.81);Other abnormalities of gait and mobility (R26.89);Cognitive communication deficit (R41.841) Symptoms and signs involving cognitive functions:  (Traumatic SAH)                Time: 5956-3875 OT Time Calculation (min): 22 min Charges:  OT General Charges $OT Visit: 1 Visit OT Evaluation $OT Eval Moderate Complexity: 1 Mod  Jose Beck, OTR/L [facility] [place] Acute Rehabilitation Office: [phone]   Jose Beck 12/20/2021, 12:00 PM

## 2021-12-21 ENCOUNTER — Encounter (HOSPITAL_COMMUNITY): Payer: Self-pay | Admitting: Internal Medicine

## 2021-12-21 DIAGNOSIS — I609 Nontraumatic subarachnoid hemorrhage, unspecified: Secondary | ICD-10-CM | POA: Diagnosis not present

## 2021-12-21 LAB — GLUCOSE, CAPILLARY: Glucose-Capillary: 104 mg/dL — ABNORMAL HIGH (ref 70–99)

## 2021-12-21 MED ORDER — LEVETIRACETAM 500 MG PO TABS: 500.0000 mg | ORAL_TABLET | Freq: Two times a day (BID) | ORAL | 2 refills | Status: DC

## 2021-12-21 MED ORDER — KERLIX AMD ANTIMICROBIAL MISC: 1.0000 | Freq: Every day | 0 refills | Status: DC

## 2021-12-21 MED ORDER — POLYVINYL ALCOHOL 1.4 % OP SOLN
1.0000 [drp] | OPHTHALMIC | 0 refills | Status: DC | PRN
Start: 2021-12-21 — End: 2022-01-27

## 2021-12-21 NOTE — TOC Transition Note (Signed)
Transition of Care [facility]) - CM/SW Discharge Note   Patient Details  Name: Jose Beck MRN: [id number] Date of Birth: 29-Jan-1946  [facility] of [facility] CM/SW Contact:  Carles Collet, RN Phone Number: 12/21/2021, 10:37 AM   Clinical Narrative:    Damaris Schooner w patient's wife over the phone, discussed therapy recommendations. Agreeable to any highly rated [place] agency, confirmed no DME needs.  Referral accepted by [facility]. No other TOC needs for DC.     Final next level of care: [place] Barriers to Discharge: No Barriers Identified   Patient Goals and CMS Choice Patient states their goals for this hospitalization and ongoing recovery are:: to go home CMS Medicare.gov Compare Post Acute Care list provided to:: Other (Comment Required) Choice offered to / list presented to : Spouse  Discharge Placement                       Discharge Plan and Services                DME Arranged: N/A         HH Arranged: PT, OT HH Agency: [place] Date [place]: 12/21/21 Time [place]: [id number] Representative spoke with at [place]: Angie  Social Determinants of Health ([place]) Interventions     Readmission Risk Interventions     No data to display

## 2021-12-21 NOTE — Progress Notes (Signed)
Patient ready for discharge to home; discharge instructions given and reviewed; Rx's sent electronically; patient discharged out via wheelchair; accompanied home by his wife; all belongings returned; patient has his cane with him for ambulation support.

## 2021-12-21 NOTE — Discharge Summary (Signed)
Physician Discharge Summary  Jose Beck WFU:[id number] DOB: 02/11/1946 DOA: 12/19/2021  PCP: [name], MD  Admit date: 12/19/2021 Discharge date: 12/21/2021  Admitted From: Home Disposition: Home  Recommendations for Outpatient Follow-up:  Follow up with PCP in 1-2 weeks   Home Health: PT/OT Equipment/Devices: None, has cane at home.  Discharge Condition: Stable CODE STATUS: Full code Diet recommendation: Low-salt diet  Discharge summary: 76 year old with history of stroke with right-sided residual weakness and mild aphasia, coronary artery disease status post stenting on Plavix, chronic heart failure with reduced ejection fraction, mild aortic stenosis, AAA repair, type 2 diabetes, hyperlipidemia and recently diagnosed with prostate cancer brought to ER after falling and hitting right side of the head.  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  Previous fall last year with right shoulder fracture.  In the emergency room blood pressure elevated.  Found to have intracranial hemorrhage and admitted to the hospital with neurosurgery consultation.  Clinically improved with conservative management.   Traumatic acute subarachnoid hemorrhage and acute subdural hematoma: Neurologically stable today.  Conservatively treated. Followed by neurosurgery.  They recommended prophylactic Keppra for seizure prophylaxis.  No surgical indication. Keppra 500 mg twice daily, prescribed for 3 months. Blood pressure control with carvedilol and losartan.  Adequate today. Plavix will be held for 2 weeks. He was given 1 unit of platelets. PT OT recommended home health PT OT.  Will prescribe.   Fall, less likely syncope or cardiac arrhythmia: Followed by cardiology.  Echocardiogram with normal ejection fraction.  Ruled out cardiac cause of syncope.   Essential hypertension, uncontrolled: On Coreg and losartan at home.  Adequate.    IDDM with hyperglycemia: Lantus 20 units daily continued, stable.   Chronic systolic heart failure: Euvolemic.  Not on diuretics.   Metastatic prostate cancer: Outpatient follow-up with oncology.   Stable for discharge.  Discharge Diagnoses:  Principal Problem:   SAH (subarachnoid hemorrhage) ([place])    Discharge Instructions  Discharge Instructions     Diet - low sodium heart healthy   Complete by: As directed    Diet Carb Modified   Complete by: As directed    Discharge instructions   Complete by: As directed    Resume Plavix in 2 weeks from today. 8/26   Discharge wound care:   Complete by: As directed    Warp with kerlex and tape secure , keep it clean   Increase activity slowly   Complete by: As directed       Allergies as of 12/21/2021       Reactions   Cheese Nausea And Vomiting   Pravastatin Anxiety   Anxiety attack        Medication List     STOP taking these medications    clopidogrel 75 MG tablet Commonly known as: PLAVIX   Pfizer COVID-19 Vac Bivalent injection Generic drug: COVID-19 mRNA bivalent vaccine Therapist, music)       TAKE these medications    acetaminophen 650 MG CR tablet Commonly known as: TYLENOL Take 650 mg by mouth every 8 (eight) hours as needed for pain.   atorvastatin 80 MG tablet Commonly known as: LIPITOR Take 1 tablet (80 mg total) by mouth daily at 6 PM.   calcium-vitamin D 250-100 MG-UNIT tablet Take 1 tablet by mouth daily. Takes in morning '600mg'$    carvedilol 25 MG tablet Commonly known as: COREG Take 25 mg by mouth 2 (two) times daily with  a meal.   insulin detemir 100 UNIT/ML injection Commonly known as: LEVEMIR Inject 15 Units into the skin daily.   Kerlix AMD Antimicrobial Misc 1 each by Does not apply route daily.   levETIRAcetam 500 MG tablet Commonly known as: Keppra Take 1 tablet (500 mg total) by mouth 2 (two) times daily.   losartan 25 MG tablet Commonly known as: COZAAR Take 25 mg by mouth  daily.   nitroGLYCERIN 0.4 MG SL tablet Commonly known as: NITROSTAT Place 0.4 mg under the tongue every 5 (five) minutes as needed for chest pain (x 3 doses).   polyvinyl alcohol 1.4 % ophthalmic solution Commonly known as: LIQUIFILM TEARS Place 1 drop into the right eye every 2 (two) hours as needed for dry eyes.   tacrolimus 0.1 % ointment Commonly known as: PROTOPIC Apply 1 application topically daily as needed (facial scaling).               Discharge Care Instructions  (From admission, onward)           Start     Ordered   12/21/21 0000  Discharge wound care:       Comments: Warp with kerlex and tape secure , keep it clean   12/21/21 0911            Allergies  Allergen Reactions   Cheese Nausea And Vomiting   Pravastatin Anxiety    Anxiety attack    Consultations: Neurosurgery Cardiology   Procedures/Studies: ECHOCARDIOGRAM COMPLETE  Result Date: 12/20/2021    ECHOCARDIOGRAM REPORT   Patient Name:   Jose Beck Date of Exam: 12/20/2021 Medical Rec #:  [id number]           Height:       67.0 in Accession #:    [id number]          Weight:       240.0 lb Date of Birth:  06-Feb-1946          BSA:          2.185 m Patient Age:    76 years            BP:           117/69 mmHg Patient Gender: M                   HR:           58 bpm. Exam Location:  Inpatient Procedure: 2D Echo, Cardiac Doppler and Color Doppler Indications:    Syncope  History:        Patient has prior history of Echocardiogram examinations. CAD;                 Risk Factors:Diabetes, Hypertension and Dyslipidemia. Hx CVA.                 AAA.  Sonographer:    Clayton Lefort RDCS (AE) Referring Phys: Lequita Halt  Sonographer Comments: No subcostal window. IMPRESSIONS  1. Left ventricular ejection fraction, by estimation, is 60 to 65%. The left ventricle has normal function. The left ventricle demonstrates regional wall motion abnormalities (see scoring diagram/findings for description). There  is mild concentric left ventricular hypertrophy. Left ventricular diastolic parameters are indeterminate. There is hypokinesis of the left ventricular, mid anteroseptal wall and inferoseptal wall.  2. Right ventricular systolic function is normal. The right ventricular size is mildly enlarged. Tricuspid regurgitation signal is inadequate for assessing PA pressure.  3. Right atrial size  was moderately dilated.  4. The mitral valve is normal in structure. Trivial mitral valve regurgitation. No evidence of mitral stenosis.  5. The aortic valve was not well visualized. Aortic valve regurgitation is not visualized. Mild to moderate aortic valve stenosis. Aortic valve mean gradient measures 11.0 mmHg. Aortic valve Vmax measures 2.19 m/s The mean AVG and peak velocity are c/w mild AS but the SVI is low at 22 and DVI is reduced at 0.3. Findings consistent with at least mild to moderate and likely moderate paradoxical low flow low gradient AS. The AV is poorly visualized.  6. Aortic dilatation noted. There is mild dilatation of the ascending aorta, measuring 44 mm.  7. Consider repeat limited echo with definity contrast to better define wall motion in the infero and anteroseptal walls. There is significant respiratory motion that contributes to poor image quality. FINDINGS  Left Ventricle: Left ventricular ejection fraction, by estimation, is 60 to 65%. The left ventricle has normal function. The left ventricle demonstrates regional wall motion abnormalities. The left ventricular internal cavity size was normal in size. There is mild concentric left ventricular hypertrophy. Left ventricular diastolic parameters are indeterminate. Normal left ventricular filling pressure. Right Ventricle: The right ventricular size is mildly enlarged. No increase in right ventricular wall thickness. Right ventricular systolic function is normal. Tricuspid regurgitation signal is inadequate for assessing PA pressure. Left Atrium: Left atrial  size was normal in size. Right Atrium: Right atrial size was moderately dilated. Pericardium: There is no evidence of pericardial effusion. Mitral Valve: The mitral valve is normal in structure. Mild mitral annular calcification. Trivial mitral valve regurgitation. No evidence of mitral valve stenosis. Tricuspid Valve: The tricuspid valve is normal in structure. Tricuspid valve regurgitation is not demonstrated. No evidence of tricuspid stenosis. Aortic Valve: The aortic valve was not well visualized. Aortic valve regurgitation is not visualized. Mild to moderate aortic stenosis is present. Aortic valve mean gradient measures 11.0 mmHg. Aortic valve peak gradient measures 19.2 mmHg. Aortic valve area, by VTI measures 0.95 cm. Pulmonic Valve: The pulmonic valve was normal in structure. Pulmonic valve regurgitation is not visualized. No evidence of pulmonic stenosis. Aorta: Aortic dilatation noted. There is mild dilatation of the ascending aorta, measuring 44 mm. Venous: The inferior vena cava was not well visualized. IAS/Shunts: No atrial level shunt detected by color flow Doppler.  LEFT VENTRICLE PLAX 2D LVIDd:         5.00 cm   Diastology LVIDs:         3.10 cm   LV e' medial:    7.07 cm/s LV PW:         1.60 cm   LV E/e' medial:  15.6 LV IVS:        1.30 cm   LV e' lateral:   9.36 cm/s LVOT diam:     2.00 cm   LV E/e' lateral: 11.8 LV SV:         49 LV SV Index:   22 LVOT Area:     3.14 cm  RIGHT VENTRICLE RV Basal diam:  4.00 cm RV Mid diam:    3.80 cm RV S prime:     18.70 cm/s TAPSE (M-mode): 3.3 cm LEFT ATRIUM             Index        RIGHT ATRIUM           Index LA diam:        4.70 cm 2.15 cm/m   RA  Area:     26.40 cm LA Vol (A2C):   76.3 ml 34.92 ml/m  RA Volume:   85.30 ml  39.04 ml/m LA Vol (A4C):   55.8 ml 25.54 ml/m LA Biplane Vol: 70.4 ml 32.22 ml/m  AORTIC VALVE AV Area (Vmax):    0.97 cm AV Area (Vmean):   0.94 cm AV Area (VTI):     0.95 cm AV Vmax:           219.00 cm/s AV Vmean:           151.000 cm/s AV VTI:            0.514 m AV Peak Grad:      19.2 mmHg AV Mean Grad:      11.0 mmHg LVOT Vmax:         67.70 cm/s LVOT Vmean:        45.200 cm/s LVOT VTI:          0.155 m LVOT/AV VTI ratio: 0.30  AORTA Ao Root diam: 3.60 cm Ao Asc diam:  4.40 cm MITRAL VALVE MV Area (PHT): 3.99 cm     SHUNTS MV Decel Time: 190 msec     Systemic VTI:  0.16 m MV E velocity: 110.00 cm/s  Systemic Diam: 2.00 cm MV A velocity: 76.70 cm/s MV E/A ratio:  1.43 Fransico Him MD Electronically signed by Fransico Him MD Signature Date/Time: 12/20/2021/3:54:27 PM    Final    CT Head Wo Contrast  Addendum Date: 12/19/2021   ADDENDUM REPORT: 12/19/2021 21:08 ADDENDUM: These results were called by telephone at the time of interpretation on 12/19/2021 at 9:07 pm to provider Dr. Melany Guernsey, who verbally acknowledged these results. Electronically Signed   By: Iven Finn M.D.   On: 12/19/2021 21:08   Result Date: 12/19/2021 CLINICAL DATA:  CT Head 6 hour follow-up CT Head Level 2 Trauma; Fall on blood thinners EXAM: CT HEAD WITHOUT CONTRAST TECHNIQUE: Contiguous axial images were obtained from the base of the skull through the vertex without intravenous contrast. RADIATION DOSE REDUCTION: This exam was performed according to the departmental dose-optimization program which includes automated exposure control, adjustment of the mA and/or kV according to patient size and/or use of iterative reconstruction technique. COMPARISON:  CT head 12/19/2021 1:59 p.m. FINDINGS: Brain: No evidence of large-territorial acute infarction. No parenchymal hemorrhage. No mass lesion. Gross Similar-appearing small volume right temporal and anterior right frontal subarachnoid hemorrhage. Interval blooming of a now 5 mm left basal ganglia/insular region hyperdensity suggestive of intraparenchymal bleed. Query developing intraparenchymal right frontal lobe hematoma (3:21) measuring up to 4 mm. No mass effect or midline shift. No hydrocephalus. Basilar  cisterns are patent. Vascular: No hyperdense vessel. Atherosclerotic calcifications are present within the cavernous internal carotid arteries. Skull: No acute fracture or focal lesion. Sinuses/Orbits: Bilateral frontal and ethmoid mucosal thickening. Paranasal sinuses and mastoid air cells are clear. Bilateral lens replacement. The orbits are unremarkable. Other: Increase in size right frontal scalp hematoma formation measuring up to 1.7 cm. Interval development of a marked right periorbital hematoma. IMPRESSION: 1. Interval development of a 5 mm left basal ganglia/insular region intraparenchymal hemorrhage. 2. Query developing 4 mm right frontal intraparenchymal hematoma. 3. Grossly stable small volume right temporal and anterior right frontal subarachnoid hemorrhage. 4. Increasing in size right frontal scalp hematoma formation measuring up to 1.7 cm with interval development of marked right periorbital hematoma. Electronically Signed: By: Iven Finn M.D. On: 12/19/2021 20:59   CT CHEST W CONTRAST  Result  Date: 12/19/2021 CLINICAL DATA:  Chest trauma, blunt fall, complaining of right sided chest pain. Fall EXAM: CT CHEST WITH CONTRAST TECHNIQUE: Multidetector CT imaging of the chest was performed during intravenous contrast administration. RADIATION DOSE REDUCTION: This exam was performed according to the departmental dose-optimization program which includes automated exposure control, adjustment of the mA and/or kV according to patient size and/or use of iterative reconstruction technique. CONTRAST:  28m OMNIPAQUE IOHEXOL 300 MG/ML  SOLN COMPARISON:  11/02/2017 FINDINGS: Cardiovascular: Diffuse coronary artery and aortic calcifications. Heart is normal size. Aorta is normal caliber. Mediastinum/Nodes: No mediastinal, hilar, or axillary adenopathy. Trachea and esophagus are unremarkable. Thyroid unremarkable. Lungs/Pleura: Lungs are clear. No focal airspace opacities or suspicious nodules. No effusions.  No pneumothorax Upper Abdomen: Cirrhotic liver. No acute abnormality. Moderate-sized hiatal hernia. Musculoskeletal: No acute bony abnormality. Chest wall soft tissues are unremarkable. IMPRESSION: No acute cardiopulmonary disease. Diffuse coronary artery disease. Moderate-sized hiatal hernia. Aortic Atherosclerosis (ICD10-I70.0). Electronically Signed   By: KRolm BaptiseM.D.   On: 12/19/2021 17:24   DG Forearm Right  Result Date: 12/19/2021 CLINICAL DATA:  Patient on blood thinners. Level III fall. Contusion on the RIGHT side of the forehead. EXAM: RIGHT FOREARM - 2 VIEW COMPARISON:  None Available. FINDINGS: There is no evidence of fracture or other focal bone lesions. Soft tissues are unremarkable. IMPRESSION: Negative. Electronically Signed   By: ENolon NationsM.D.   On: 12/19/2021 14:47   DG Hand Complete Right  Result Date: 12/19/2021 CLINICAL DATA:  Patient on blood thinners. Level III fall. Contusion on the RIGHT side of the forehead. EXAM: RIGHT HAND - COMPLETE 3+ VIEW COMPARISON:  None Available. FINDINGS: Pulse oximeter overlies the third digit. There is no acute fracture or subluxation. No radiopaque foreign body or soft tissue gas. IMPRESSION: Negative. Electronically Signed   By: ENolon NationsM.D.   On: 12/19/2021 14:46   DG Shoulder Right  Result Date: 12/19/2021 CLINICAL DATA:  Patient on blood thinners. Level III fall. Contusion on the RIGHT side of the forehead. EXAM: RIGHT SHOULDER - 2+ VIEW COMPARISON:  Chest x-ray 11/25/2017 FINDINGS: There is an acute fracture of the RIGHT humeral neck, with overriding of fracture fragments and varus angulation. Humeral head is located in the glenoid fossa. IMPRESSION: Acute fracture of the RIGHT humeral neck. Electronically Signed   By: ENolon NationsM.D.   On: 12/19/2021 14:45   CT Maxillofacial Wo Contrast  Result Date: 12/19/2021 CLINICAL DATA:  Facial trauma, blunt. Fall. Patient is on blood thinners. EXAM: CT MAXILLOFACIAL  WITHOUT CONTRAST TECHNIQUE: Multidetector CT imaging of the maxillofacial structures was performed. Multiplanar CT image reconstructions were also generated. RADIATION DOSE REDUCTION: This exam was performed according to the departmental dose-optimization program which includes automated exposure control, adjustment of the mA and/or kV according to patient size and/or use of iterative reconstruction technique. COMPARISON:  No acute fractures are present. Mandible is intact and located. FINDINGS: Osseous: No acute fracture is present. The mandible is intact and located. Orbits: Bilateral lens replacements are noted. Globes and orbits are otherwise unremarkable. Sinuses: Mucosal thickening is present the anterior ethmoid air cells and inferior frontal sinuses without fluid levels. Soft tissues: Large right periorbital and temporal hematoma is present without underlying fracture or radiopaque foreign body. No other focal soft tissue injury is present. Limited intracranial: Subarachnoid and subdural blood is described in detail on CT head report of the same day. IMPRESSION: 1. Large right periorbital and temporal hematoma without underlying fracture or radiopaque foreign body.  2. No other acute trauma to the face. 3. Subarachnoid and subdural blood is described in detail on CT head report of the same day. Critical Value/emergent results were called by telephone at the time of interpretation on 12/19/2021 at 2:19pm to provider [facility] , who verbally acknowledged these results. Electronically Signed   By: San Morelle M.D.   On: 12/19/2021 14:44   DG Wrist Complete Right  Result Date: 12/19/2021 CLINICAL DATA:  Patient on blood thinners. Level III fall. Contusion on the RIGHT side of the forehead. EXAM: RIGHT WRIST - COMPLETE 3+ VIEW COMPARISON:  None Available. FINDINGS: There is no evidence of fracture or dislocation. There is no evidence of arthropathy or other focal bone abnormality. Soft tissues  are unremarkable. IMPRESSION: Negative. Electronically Signed   By: Nolon Nations M.D.   On: 12/19/2021 14:43   CT Cervical Spine Wo Contrast  Result Date: 12/19/2021 CLINICAL DATA:  Fall.  Patient is on blood thinners. EXAM: CT CERVICAL SPINE WITHOUT CONTRAST TECHNIQUE: Multidetector CT imaging of the cervical spine was performed without intravenous contrast. Multiplanar CT image reconstructions were also generated. RADIATION DOSE REDUCTION: This exam was performed according to the departmental dose-optimization program which includes automated exposure control, adjustment of the mA and/or kV according to patient size and/or use of iterative reconstruction technique. COMPARISON:  None Available. FINDINGS: Alignment: No significant listhesis is present. Cervical lordosis is preserved. Skull base and vertebrae: Craniocervical junction is within normal limits. Vertebral body heights are normal. No acute fractures are present. Ossification along the posterior longitudinal ligament is likely related to remote trauma. Soft tissues and spinal canal: No prevertebral fluid or swelling. No visible canal hematoma. Atherosclerotic calcifications are present within the carotid arteries bilaterally, left greater than right. Disc levels:  No significant osseous stenosis is evident. Upper chest: The lung apices are clear. Thoracic inlet is within normal limits. IMPRESSION: 1. No acute fracture or traumatic subluxation. 2. Ossification along the posterior longitudinal ligament is likely related to remote trauma. 3. Atherosclerosis. These results were called by telephone at the time of interpretation on 12/19/2021 at 2:19pm to provider [facility] , who verbally acknowledged these results. Electronically Signed   By: San Morelle M.D.   On: 12/19/2021 14:38   CT Head Wo Contrast  Result Date: 12/19/2021 CLINICAL DATA:  Level 2 trauma. Blunt. Fall. Patient is on blood thinners EXAM: CT HEAD WITHOUT CONTRAST  TECHNIQUE: Contiguous axial images were obtained from the base of the skull through the vertex without intravenous contrast. RADIATION DOSE REDUCTION: This exam was performed according to the departmental dose-optimization program which includes automated exposure control, adjustment of the mA and/or kV according to patient size and/or use of iterative reconstruction technique. COMPARISON:  CT head without contrast 07/21/17 FINDINGS: Brain: Acute subarachnoid hemorrhage is present the posterior right temporal lobe in more anteriorly over the right frontal lobe. A thin subdural hemorrhage is also present adjacent to the right temporal tip in the right middle cranial fossa. Remote cerebellar infarcts are again seen, right greater than left. No acute ischemic infarct is present. Moderate generalized atrophy is similar the prior exam. The ventricles are proportionate to the degree of atrophy. Vascular: Dense atherosclerotic calcifications are present within the cavernous internal carotid arteries. No hyperdense vessel is present. Skull: A large right periorbital and temporal hematoma is present. No underlying fracture is present. Calvarium is intact. No foreign body is present. No other focal extracranial soft tissue injury is present. Sinuses/Orbits: Anterior ethmoid air cells are opacified.  Extensive mucosal thickening is present in the frontal sinuses bilaterally. The paranasal sinuses and mastoid air cells are otherwise clear. Bilateral lens replacements are noted. Globes and orbits are otherwise unremarkable. IMPRESSION: 1. Acute subarachnoid hemorrhage involving the posterior right temporal lobe and anterior right frontal lobe. 2. Thin subdural hemorrhage adjacent to the right temporal tip in the right middle cranial fossa. 3. Large right periorbital and temporal hematoma without underlying fracture. 4. Remote cerebellar infarcts, right greater than left. 5. Stable atrophy and white matter disease. Critical  Value/emergent results were called by telephone at the time of interpretation on 12/19/2021 at 2:19 pm to provider [facility] , who verbally acknowledged these results. Electronically Signed   By: San Morelle M.D.   On: 12/19/2021 14:34   DG Chest Portable 1 View  Result Date: 12/19/2021 CLINICAL DATA:  Trauma, fall EXAM: PORTABLE CHEST 1 VIEW COMPARISON:  11/25/2017 FINDINGS: The heart size and mediastinal contours are within normal limits. Both lungs are clear. There is deformity in the right shoulder with possible fracture or dislocation. Right shoulder is not adequately visualized for evaluation. IMPRESSION: There are no focal pulmonary infiltrates. There is no pleural effusion or pneumothorax. There is deformity in right shoulder suggesting possible fracture dislocation. Right shoulder is not included in its entirety limiting evaluation. Routine radiographs of right shoulder is recommended for further evaluation. Electronically Signed   By: Elmer Picker M.D.   On: 12/19/2021 14:03   DG Pelvis Portable  Result Date: 12/19/2021 CLINICAL DATA:  Trauma, fall EXAM: PORTABLE PELVIS 1-2 VIEWS COMPARISON:  None Available. FINDINGS: No displaced fracture or dislocation is seen. Arterial calcifications are seen in soft tissues. Vascular stents are noted in the courses of iliac arteries. IMPRESSION: No recent fracture or dislocation is seen. Electronically Signed   By: Elmer Picker M.D.   On: 12/19/2021 13:58   (Echo, Carotid, EGD, Colonoscopy, ERCP)    Subjective: Patient seen and examined.  Wife at the bedside.  No overnight events.  Right eye is more open now able to see.  He used his cane and walk to the bathroom by himself.  Eager to go home.   Discharge Exam: Vitals:   12/21/21 0336 12/21/21 0849  BP: 124/72 (!) 140/81  Pulse: 66 84  Resp: 20 18  Temp: 98.6 F (37 C) 98.8 F (37.1 C)  SpO2: 98% 100%   Vitals:   12/20/21 1924 12/21/21 0336 12/21/21 0536 12/21/21  0849  BP: (!) 114/58 124/72  (!) 140/81  Pulse: (!) 58 66  84  Resp: '20 20  18  '$ Temp: 98.2 F (36.8 C) 98.6 F (37 C)  98.8 F (37.1 C)  TempSrc: Oral Oral  Oral  SpO2: 100% 98%  100%  Weight:   98.4 kg   Height:        General: Pt is alert, awake, not in acute distress, on room air. Cardiovascular: RRR, S1/S2 +, no rubs, no gallops Respiratory: CTA bilaterally, no wheezing, no rhonchi Abdominal: Soft, NT, ND, bowel sounds + Ecchymosis and hematoma right side of the neck, periorbital hematoma with receding margins right.  Sclera visible with soft scleral hemorrhage.  Vision is normal. Patient does have right hemiparesis which is chronic.    The results of significant diagnostics from this hospitalization (including imaging, microbiology, ancillary and laboratory) are listed below for reference.     Microbiology: No results found for this or any previous visit (from the past 240 hour(s)).   Labs: BNP (last 3 results) No  results for input(s): "BNP" in the last 8760 hours. Basic Metabolic Panel: Recent Labs  Lab 12/19/21 1333 12/19/21 1348  NA 133*  --   K 4.5  --   CL 100  --   CO2 24  --   GLUCOSE 181*  --   BUN 21  --   CREATININE 1.16  --   CALCIUM 9.1  --   MG  --  2.0  PHOS  --  3.0   Liver Function Tests: Recent Labs  Lab 12/19/21 1333  AST 21  ALT 18  ALKPHOS 45  BILITOT 0.9  PROT 6.8  ALBUMIN 3.9   No results for input(s): "LIPASE", "AMYLASE" in the last 168 hours. No results for input(s): "AMMONIA" in the last 168 hours. CBC: Recent Labs  Lab 12/19/21 1333 12/20/21 0247  WBC 4.6 6.0  NEUTROABS 3.4  --   HGB 13.3 12.7*  HCT 38.6* 35.7*  MCV 98.0 94.4  PLT 107* 107*   Cardiac Enzymes: No results for input(s): "CKTOTAL", "CKMB", "CKMBINDEX", "TROPONINI" in the last 168 hours. BNP: Invalid input(s): "POCBNP" CBG: Recent Labs  Lab 12/20/21 0635 12/20/21 1139 12/20/21 1637 12/20/21 2142 12/21/21 0624  GLUCAP 109* 131* 106* 123*  104*   D-Dimer No results for input(s): "DDIMER" in the last 72 hours. Hgb A1c Recent Labs    12/19/21 1333  HGBA1C 5.4   Lipid Profile No results for input(s): "CHOL", "HDL", "LDLCALC", "TRIG", "CHOLHDL", "LDLDIRECT" in the last 72 hours. Thyroid function studies No results for input(s): "TSH", "T4TOTAL", "T3FREE", "THYROIDAB" in the last 72 hours.  Invalid input(s): "FREET3" Anemia work up No results for input(s): "VITAMINB12", "FOLATE", "FERRITIN", "TIBC", "IRON", "RETICCTPCT" in the last 72 hours. Urinalysis    Component Value Date/Time   COLORURINE YELLOW 11/18/2017 1419   APPEARANCEUR CLEAR 11/18/2017 1419   LABSPEC 1.014 11/18/2017 1419   PHURINE 6.0 11/18/2017 1419   GLUCOSEU NEGATIVE 11/18/2017 1419   HGBUR NEGATIVE 11/18/2017 1419   BILIRUBINUR NEGATIVE 11/18/2017 1419   KETONESUR NEGATIVE 11/18/2017 1419   PROTEINUR NEGATIVE 11/18/2017 1419   NITRITE NEGATIVE 11/18/2017 1419   LEUKOCYTESUR NEGATIVE 11/18/2017 1419   Sepsis Labs Recent Labs  Lab 12/19/21 1333 12/20/21 0247  WBC 4.6 6.0   Microbiology No results found for this or any previous visit (from the past 240 hour(s)).   Time coordinating discharge: 35 minutes  SIGNED:   Barb Merino, MD  [facility] 12/21/2021, 9:11 AM

## 2021-12-21 NOTE — Progress Notes (Signed)
Physical Therapy Treatment Patient Details Name: Jose Beck MRN: [id number] DOB: 1945/08/04 Today's Date: 12/21/2021   History of Present Illness Pt is a 76 y/o male who presents to [facility] ED 12/19/2021 s/p fall at home, hitting head. 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 There is also a thin subdural hemorrhage adjacent to the right temporal tip in the right middle cranial fossa. No current recommendations per neurosurgery. PMH significant for AAA, CHF, DM II, HTN, prostate cancer, and stroke (residual right-sided weakness and mild expressive aphasia).    PT Comments    Pt progressing well, near baseline. Discussed energy conservation and being aware of when his R LE is fatigue and becomes unstable with spouse and pt. Pt remains very motivated to remain indep however continues to demo decreased insight to safety and deficits. Acute PT to cont to follow.    Recommendations for follow up therapy are one component of a multi-disciplinary discharge planning process, led by the attending physician.  Recommendations may be updated based on patient status, additional functional criteria and insurance authorization.  Follow Up Recommendations  Home health PT     Assistance Recommended at Discharge Frequent or constant Supervision/Assistance  Patient can return home with the following A little help with walking and/or transfers;A little help with bathing/dressing/bathroom;Assistance with cooking/housework;Assist for transportation;Help with stairs or ramp for entrance   Equipment Recommendations  None recommended by PT    Recommendations for Other Services       Precautions / Restrictions Precautions Precautions: Fall Restrictions Weight Bearing Restrictions: No     Mobility  Bed Mobility               General bed mobility comments: pt sitting EOB upon PT arrival    Transfers Overall transfer level: Needs  assistance Equipment used: Straight cane Transfers: Sit to/from Stand Sit to Stand: Min guard           General transfer comment: a couple of attempts to power up, min guard for safety but no physical assist needed, increased time    Ambulation/Gait Ambulation/Gait assistance: Min guard Gait Distance (Feet): 80 Feet Assistive device: Straight cane Gait Pattern/deviations: Decreased stride length, Shuffle, Wide base of support Gait velocity: Decreased Gait velocity interpretation: <1.31 ft/sec, indicative of household ambulator   General Gait Details: pt with wide base of support, short shuffled steps more on the R LE than L, used cane properly however with onset of fatigue and noted R LE weakness/knee instability pt would transfer cane to the R UE and hold onto railing with the L hand, offerred RW however pt declined   Marine scientist [place] (Stroke Patients Only)       Balance Overall balance assessment: Needs assistance Sitting-balance support: No upper extremity supported Sitting balance-Leahy Scale: Fair Sitting balance - Comments: pt able to don sneakers and tie them without LOB   Standing balance support: Single extremity supported Standing balance-Leahy Scale: Poor Standing balance comment: Wide BOS and decreased balance standing at sink.                            Cognition Arousal/Alertness: Awake/alert Behavior During Therapy: Flat affect Overall Cognitive Status: History of cognitive impairments - at baseline Area of Impairment: Following commands, Safety/judgement, Awareness, Problem solving, Attention  Current Attention Level: Sustained   Following Commands: Follows one step commands consistently, Follows one step commands with increased time Safety/Judgement: Decreased awareness of safety, Decreased awareness of deficits Awareness: Emergent   General Comments: pt declining  help, strives to be indep however noted decreased insight to safety and deficits        Exercises      General Comments General comments (skin integrity, edema, etc.): wife present. spoke of energy conservation and pt with noted R LE fatigue and instability which could've contribulated to pt's fall      Pertinent Vitals/Pain Pain Assessment Pain Assessment: No/denies pain    Home Living                          Prior Function            PT Goals (current goals can now be found in the care plan section) Acute Rehab PT Goals PT Goal Formulation: With patient/family Time For Goal Achievement: 12/27/21 Potential to Achieve Goals: Good Progress towards PT goals: Progressing toward goals    Frequency    Min 3X/week      PT Plan Current plan remains appropriate    Co-evaluation              AM-PAC PT "6 Clicks" Mobility   Outcome Measure  Help needed turning from your back to your side while in a flat bed without using bedrails?: A Little Help needed moving from lying on your back to sitting on the side of a flat bed without using bedrails?: A Little Help needed moving to and from a bed to a chair (including a wheelchair)?: A Little Help needed standing up from a chair using your arms (e.g., wheelchair or bedside chair)?: A Little Help needed to walk in hospital room?: A Little Help needed climbing 3-5 steps with a railing? : A Little 6 Click Score: 18    End of Session Equipment Utilized During Treatment: Gait belt Activity Tolerance: Patient tolerated treatment well Patient left: in chair;with call bell/phone within reach;with chair alarm set;with family/visitor present Nurse Communication: Mobility status PT Visit Diagnosis: Unsteadiness on feet (R26.81);Pain     Time: 9794-8016 PT Time Calculation (min) (ACUTE ONLY): 20 min  Charges:  $Gait Training: 8-22 mins                     Kittie Plater, PT, DPT Acute Rehabilitation  Services Secure chat preferred Office #: [phone]    Berline Lopes 12/21/2021, 12:03 PM

## 2021-12-24 DIAGNOSIS — C775 Secondary and unspecified malignant neoplasm of intrapelvic lymph nodes: Secondary | ICD-10-CM | POA: Diagnosis not present

## 2021-12-24 DIAGNOSIS — C7951 Secondary malignant neoplasm of bone: Secondary | ICD-10-CM | POA: Diagnosis not present

## 2021-12-24 DIAGNOSIS — I350 Nonrheumatic aortic (valve) stenosis: Secondary | ICD-10-CM | POA: Diagnosis not present

## 2021-12-24 DIAGNOSIS — I2510 Atherosclerotic heart disease of native coronary artery without angina pectoris: Secondary | ICD-10-CM | POA: Diagnosis not present

## 2021-12-24 DIAGNOSIS — S065XAD Traumatic subdural hemorrhage with loss of consciousness status unknown, subsequent encounter: Secondary | ICD-10-CM | POA: Diagnosis not present

## 2021-12-24 DIAGNOSIS — I69351 Hemiplegia and hemiparesis following cerebral infarction affecting right dominant side: Secondary | ICD-10-CM | POA: Diagnosis not present

## 2021-12-24 DIAGNOSIS — E1165 Type 2 diabetes mellitus with hyperglycemia: Secondary | ICD-10-CM | POA: Diagnosis not present

## 2021-12-24 DIAGNOSIS — G319 Degenerative disease of nervous system, unspecified: Secondary | ICD-10-CM | POA: Diagnosis not present

## 2021-12-24 DIAGNOSIS — S066XAD Traumatic subarachnoid hemorrhage with loss of consciousness status unknown, subsequent encounter: Secondary | ICD-10-CM | POA: Diagnosis not present

## 2021-12-24 DIAGNOSIS — I110 Hypertensive heart disease with heart failure: Secondary | ICD-10-CM | POA: Diagnosis not present

## 2021-12-24 DIAGNOSIS — Z794 Long term (current) use of insulin: Secondary | ICD-10-CM | POA: Diagnosis not present

## 2021-12-24 DIAGNOSIS — I69320 Aphasia following cerebral infarction: Secondary | ICD-10-CM | POA: Diagnosis not present

## 2021-12-24 DIAGNOSIS — I5042 Chronic combined systolic (congestive) and diastolic (congestive) heart failure: Secondary | ICD-10-CM | POA: Diagnosis not present

## 2021-12-24 DIAGNOSIS — C61 Malignant neoplasm of prostate: Secondary | ICD-10-CM | POA: Diagnosis not present

## 2021-12-25 DIAGNOSIS — S065XAA Traumatic subdural hemorrhage with loss of consciousness status unknown, initial encounter: Secondary | ICD-10-CM | POA: Diagnosis not present

## 2021-12-25 DIAGNOSIS — E1151 Type 2 diabetes mellitus with diabetic peripheral angiopathy without gangrene: Secondary | ICD-10-CM | POA: Diagnosis not present

## 2021-12-25 DIAGNOSIS — I2510 Atherosclerotic heart disease of native coronary artery without angina pectoris: Secondary | ICD-10-CM | POA: Diagnosis not present

## 2021-12-25 DIAGNOSIS — W19XXXA Unspecified fall, initial encounter: Secondary | ICD-10-CM | POA: Diagnosis not present

## 2021-12-25 DIAGNOSIS — E785 Hyperlipidemia, unspecified: Secondary | ICD-10-CM | POA: Diagnosis not present

## 2021-12-25 DIAGNOSIS — N1831 Chronic kidney disease, stage 3a: Secondary | ICD-10-CM | POA: Diagnosis not present

## 2021-12-25 DIAGNOSIS — I69959 Hemiplegia and hemiparesis following unspecified cerebrovascular disease affecting unspecified side: Secondary | ICD-10-CM | POA: Diagnosis not present

## 2021-12-25 DIAGNOSIS — I129 Hypertensive chronic kidney disease with stage 1 through stage 4 chronic kidney disease, or unspecified chronic kidney disease: Secondary | ICD-10-CM | POA: Diagnosis not present

## 2021-12-25 DIAGNOSIS — S066X0D Traumatic subarachnoid hemorrhage without loss of consciousness, subsequent encounter: Secondary | ICD-10-CM | POA: Diagnosis not present

## 2021-12-30 ENCOUNTER — Other Ambulatory Visit: Payer: Self-pay

## 2021-12-30 DIAGNOSIS — I713 Abdominal aortic aneurysm, ruptured, unspecified: Secondary | ICD-10-CM

## 2022-01-01 DIAGNOSIS — C61 Malignant neoplasm of prostate: Secondary | ICD-10-CM | POA: Diagnosis not present

## 2022-01-06 DIAGNOSIS — I129 Hypertensive chronic kidney disease with stage 1 through stage 4 chronic kidney disease, or unspecified chronic kidney disease: Secondary | ICD-10-CM | POA: Diagnosis not present

## 2022-01-06 DIAGNOSIS — E1151 Type 2 diabetes mellitus with diabetic peripheral angiopathy without gangrene: Secondary | ICD-10-CM | POA: Diagnosis not present

## 2022-01-06 DIAGNOSIS — C7951 Secondary malignant neoplasm of bone: Secondary | ICD-10-CM | POA: Diagnosis not present

## 2022-01-06 DIAGNOSIS — C61 Malignant neoplasm of prostate: Secondary | ICD-10-CM | POA: Diagnosis not present

## 2022-01-07 DIAGNOSIS — M549 Dorsalgia, unspecified: Secondary | ICD-10-CM | POA: Diagnosis not present

## 2022-01-07 DIAGNOSIS — C61 Malignant neoplasm of prostate: Secondary | ICD-10-CM | POA: Diagnosis not present

## 2022-01-07 DIAGNOSIS — Z87311 Personal history of (healed) other pathological fracture: Secondary | ICD-10-CM | POA: Diagnosis not present

## 2022-01-08 DIAGNOSIS — D2222 Melanocytic nevi of left ear and external auricular canal: Secondary | ICD-10-CM | POA: Diagnosis not present

## 2022-01-08 DIAGNOSIS — L578 Other skin changes due to chronic exposure to nonionizing radiation: Secondary | ICD-10-CM | POA: Diagnosis not present

## 2022-01-08 DIAGNOSIS — L814 Other melanin hyperpigmentation: Secondary | ICD-10-CM | POA: Diagnosis not present

## 2022-01-08 DIAGNOSIS — Z86018 Personal history of other benign neoplasm: Secondary | ICD-10-CM | POA: Diagnosis not present

## 2022-01-08 DIAGNOSIS — Z85828 Personal history of other malignant neoplasm of skin: Secondary | ICD-10-CM | POA: Diagnosis not present

## 2022-01-08 DIAGNOSIS — L821 Other seborrheic keratosis: Secondary | ICD-10-CM | POA: Diagnosis not present

## 2022-01-08 DIAGNOSIS — D225 Melanocytic nevi of trunk: Secondary | ICD-10-CM | POA: Diagnosis not present

## 2022-01-08 DIAGNOSIS — D485 Neoplasm of uncertain behavior of skin: Secondary | ICD-10-CM | POA: Diagnosis not present

## 2022-01-20 ENCOUNTER — Ambulatory Visit (HOSPITAL_COMMUNITY)
Admission: RE | Admit: 2022-01-20 | Discharge: 2022-01-20 | Disposition: A | Discharge status: Home / Self Care | Payer: Medicare Other | Source: Ambulatory Visit | Attending: Surgery | Admitting: Surgery

## 2022-01-20 DIAGNOSIS — I700 Atherosclerosis of aorta: Secondary | ICD-10-CM | POA: Diagnosis not present

## 2022-01-20 DIAGNOSIS — I713 Abdominal aortic aneurysm, ruptured, unspecified: Secondary | ICD-10-CM | POA: Diagnosis not present

## 2022-01-20 MED ORDER — IOHEXOL 350 MG/ML SOLN
100.0000 mL | Freq: Once | INTRAVENOUS | Status: AC | PRN
  Administered 2022-01-20: 100 mL via INTRAVENOUS

## 2022-01-27 ENCOUNTER — Encounter: Payer: Self-pay | Admitting: Surgery

## 2022-01-27 ENCOUNTER — Ambulatory Visit (INDEPENDENT_AMBULATORY_CARE_PROVIDER_SITE_OTHER): Payer: Medicare Other | Admitting: Surgery

## 2022-01-27 VITALS — BP 179/102 | HR 60 | Temp 98.2°F | Resp 20 | Ht 67.0 in | Wt 217.0 lb

## 2022-01-27 DIAGNOSIS — I7143 Infrarenal abdominal aortic aneurysm, without rupture: Secondary | ICD-10-CM | POA: Diagnosis not present

## 2022-01-27 DIAGNOSIS — I2510 Atherosclerotic heart disease of native coronary artery without angina pectoris: Secondary | ICD-10-CM

## 2022-01-27 NOTE — Progress Notes (Signed)
[facility] [place]  Patient name: Jose Beck MRN: [id number] DOB: [birth year] Sex: male   REASON FOR VISIT:    Follow up  HISOTRY OF PRESENT ILLNESS:    Jose Beck is a 76 y.o. male returns today for follow-up.  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  His postoperative course was uncomplicated.  At his last visit, ultrasound revealed an increase in size of his aneurysm, going from 4.7 cm to 5.0 cm.  He is back today with a CT scan.  He has recently been diagnosed with metastatic prostate cancer.  He has had multiple falls including a subdural hematoma.  Patient has a history of coronary artery disease.  He has known two-vessel occlusion with collaterals.  He has never undergone intervention.  He has a history of stroke in approximately 2017 while he was in [place].  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  At that time his carotid arteries were widely patent.  His residual deficits are some right-sided weakness and speech trouble.  He takes a statin for hypercholesterolemia.  He is on ARB for hypertension.  He is a non-smoker.   PAST MEDICAL HISTORY:   Past Medical History:  Diagnosis Date   Abdominal aortic aneurysm (AAA) ([place])    Aortic stenosis    a. 11/2015 Echo: EF 55-60%, Gr1 DD, mild AS.   Chronic combined systolic and diastolic CHF (congestive heart failure) ([place])    a. 07/2015: TEE showing EF of 40% b. 11/2015: Echo w/ EF 55-60%, Grade 1 DD, mild AS.   Coronary artery disease    a. patient reports 100% stenosis of LAD and RCA by cath in 1999. b. 11/2015 Cath: LM nl, LAD 100 - fills by L->L collats from D1, LCX 60d, OM1 lalrge/nl, OM2 small/nl, RCA 139m L->R collats.EF 35-45% (55-60% by echo).   Diabetes mellitus without complication ([place]    Type II   GERD (gastroesophageal reflux disease)    Hyperlipemia    Hypertension    Hypertensive heart  disease    Stroke ([place]    a. 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   Thrombocytopenia ([place] 11/2015     FAMILY HISTORY:   Family History  Problem Relation Age of Onset   Hypertension Mother    Heart disease Mother     SOCIAL HISTORY:   Social History   Tobacco Use   Smoking status: Never   Smokeless tobacco: Never  Substance Use Topics   Alcohol use: Yes    Alcohol/week: 1.0 standard drink of alcohol    Types: 1 Shots of liquor per week    Comment: cocktails every day     ALLERGIES:   Allergies  Allergen Reactions   Cheese Nausea And Vomiting   Pravastatin Anxiety    Anxiety attack     CURRENT MEDICATIONS:   Current Outpatient Medications  Medication Sig Dispense Refill   acetaminophen (TYLENOL) 650 MG CR tablet Take 650 mg by mouth every 8 (eight) hours as needed for pain.     atorvastatin (LIPITOR) 80 MG tablet Take 1 tablet (80 mg total) by mouth daily at 6 PM. 30 tablet 6   calcium-vitamin D 250-100 MG-UNIT tablet Take 1 tablet by mouth daily. Takes in morning '600mg'$      carvedilol (COREG) 25 MG tablet Take 25 mg by mouth 2 (two) times daily with a meal.     insulin detemir (LEVEMIR) 100 UNIT/ML injection  Inject 15 Units into the skin daily.     losartan (COZAAR) 50 MG tablet Take 50 mg by mouth daily.     nitroGLYCERIN (NITROSTAT) 0.4 MG SL tablet Place 0.4 mg under the tongue every 5 (five) minutes as needed for chest pain (x 3 doses).     tacrolimus (PROTOPIC) 0.1 % ointment Apply 1 application topically daily as needed (facial scaling).     No current facility-administered medications for this visit.    REVIEW OF SYSTEMS:   '[X]'$  denotes positive finding, '[ ]'$  denotes negative finding Cardiac  Comments:  Chest pain or chest pressure:    Shortness of breath upon exertion:    Short of breath when lying flat:    Irregular heart rhythm:        Vascular    Pain in calf, thigh, or hip brought on by ambulation:    Pain in feet at night that wakes  you up from your sleep:     Blood clot in your veins:    Leg swelling:         Pulmonary    Oxygen at home:    Productive cough:     Wheezing:         Neurologic    Sudden weakness in arms or legs:  x   Sudden numbness in arms or legs:     Sudden onset of difficulty speaking or slurred speech: x   Temporary loss of vision in one eye:     Problems with dizziness:         Gastrointestinal    Blood in stool:     Vomited blood:         Genitourinary    Burning when urinating:     Blood in urine:        Psychiatric    Major depression:         Hematologic    Bleeding problems:    Problems with blood clotting too easily:        Skin    Rashes or ulcers:        Constitutional    Fever or chills:      PHYSICAL EXAM:   Vitals:   01/27/22 1053  BP: (!) 179/102  Pulse: 60  Resp: 20  Temp: 98.2 F (36.8 C)  SpO2: 97%  Weight: 217 lb (98.4 kg)  Height: '5\' 7"'$  (1.702 m)    GENERAL: The patient is a well-nourished male, in no acute distress. The vital signs are documented above. CARDIAC: There is a regular rate and rhythm.  PULMONARY: Non-labored respirations ABDOMEN: Soft and non-tender  MUSCULOSKELETAL: There are no major deformities or cyanosis. NEUROLOGIC: No focal weakness or paresthesias are detected. SKIN: There are no ulcers or rashes noted. PSYCHIATRIC: The patient has a normal affect.  STUDIES:   I have reviewed the following CT angiogram:  Redemonstration of EVAR for treatment of abdominal aortic aneurysm. The current CT demonstrates an endoleak, presumably a type 2, with interval enlargement of the excluded aneurysm sac, baseline diameter 5.7 cm on 11/02/2017, and a current transverse diameter of estimated 6.4 cm.     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 Aortic Atherosclerosis (ICD10-I70.0).   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   Stigmata of cirrhosis and portal hypertension.  MEDICAL ISSUES:   AAA: I discussed the CT scan findings with the patient and his wife.  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  This has likely been present since his initial repair.  Based off recent measurements, maximum diameter today is 6.4 cm, compared to 5.7 cm at the time of his repair.  I suspect that this is a type II leak with a slow increase in aneurysm size.  I propose 2 options to the patient.  The first would be referral to interventional radiologist for embolization of a potential endoleak, the second would be continued observation with repeat CT scan.  Because of everything the patient has been going through as of late, he requested that we repeat the CT scan in 6 months.    Leia Alf, MD, FACS [facility] [phone] Pager [phone]

## 2022-02-15 DIAGNOSIS — Z23 Encounter for immunization: Secondary | ICD-10-CM | POA: Diagnosis not present

## 2022-03-17 ENCOUNTER — Other Ambulatory Visit (HOSPITAL_BASED_OUTPATIENT_CLINIC_OR_DEPARTMENT_OTHER): Payer: Self-pay

## 2022-03-17 DIAGNOSIS — Z23 Encounter for immunization: Secondary | ICD-10-CM | POA: Diagnosis not present

## 2022-03-17 MED ORDER — COMIRNATY 30 MCG/0.3ML IM SUSY
PREFILLED_SYRINGE | INTRAMUSCULAR | 0 refills | Status: DC
  Filled 2022-03-17: qty 0.3, 1d supply, fill #0

## 2022-03-27 DIAGNOSIS — R399 Unspecified symptoms and signs involving the genitourinary system: Secondary | ICD-10-CM | POA: Diagnosis not present

## 2022-04-10 DIAGNOSIS — Z191 Hormone sensitive malignancy status: Secondary | ICD-10-CM | POA: Diagnosis not present

## 2022-04-10 DIAGNOSIS — C61 Malignant neoplasm of prostate: Secondary | ICD-10-CM | POA: Diagnosis not present

## 2022-04-23 DIAGNOSIS — C7951 Secondary malignant neoplasm of bone: Secondary | ICD-10-CM | POA: Diagnosis not present

## 2022-04-23 DIAGNOSIS — I10 Essential (primary) hypertension: Secondary | ICD-10-CM | POA: Diagnosis not present

## 2022-04-23 DIAGNOSIS — C61 Malignant neoplasm of prostate: Secondary | ICD-10-CM | POA: Diagnosis not present

## 2022-04-23 DIAGNOSIS — E1151 Type 2 diabetes mellitus with diabetic peripheral angiopathy without gangrene: Secondary | ICD-10-CM | POA: Diagnosis not present

## 2022-04-26 ENCOUNTER — Emergency Department (HOSPITAL_COMMUNITY): Payer: Medicare Other

## 2022-04-26 ENCOUNTER — Encounter (HOSPITAL_COMMUNITY): Payer: Self-pay | Admitting: Internal Medicine

## 2022-04-26 ENCOUNTER — Other Ambulatory Visit: Payer: Self-pay

## 2022-04-26 ENCOUNTER — Inpatient Hospital Stay (HOSPITAL_COMMUNITY): Payer: Medicare Other

## 2022-04-26 ENCOUNTER — Inpatient Hospital Stay (HOSPITAL_COMMUNITY)
Admission: EM | Admit: 2022-04-26 | Discharge: 2022-04-29 | DRG: 281 | Disposition: A | Discharge status: Home / Self Care | Payer: Medicare Other | Attending: Internal Medicine | Admitting: Internal Medicine

## 2022-04-26 DIAGNOSIS — E1159 Type 2 diabetes mellitus with other circulatory complications: Secondary | ICD-10-CM | POA: Diagnosis not present

## 2022-04-26 DIAGNOSIS — R0789 Other chest pain: Secondary | ICD-10-CM | POA: Diagnosis not present

## 2022-04-26 DIAGNOSIS — I21A1 Myocardial infarction type 2: Secondary | ICD-10-CM | POA: Diagnosis present

## 2022-04-26 DIAGNOSIS — I350 Nonrheumatic aortic (valve) stenosis: Secondary | ICD-10-CM | POA: Diagnosis present

## 2022-04-26 DIAGNOSIS — K219 Gastro-esophageal reflux disease without esophagitis: Secondary | ICD-10-CM | POA: Diagnosis present

## 2022-04-26 DIAGNOSIS — I5042 Chronic combined systolic (congestive) and diastolic (congestive) heart failure: Secondary | ICD-10-CM | POA: Diagnosis not present

## 2022-04-26 DIAGNOSIS — I4891 Unspecified atrial fibrillation: Principal | ICD-10-CM

## 2022-04-26 DIAGNOSIS — C7951 Secondary malignant neoplasm of bone: Secondary | ICD-10-CM | POA: Diagnosis present

## 2022-04-26 DIAGNOSIS — C779 Secondary and unspecified malignant neoplasm of lymph node, unspecified: Secondary | ICD-10-CM | POA: Diagnosis present

## 2022-04-26 DIAGNOSIS — S3993XA Unspecified injury of pelvis, initial encounter: Secondary | ICD-10-CM | POA: Diagnosis not present

## 2022-04-26 DIAGNOSIS — R079 Chest pain, unspecified: Secondary | ICD-10-CM

## 2022-04-26 DIAGNOSIS — I10 Essential (primary) hypertension: Secondary | ICD-10-CM | POA: Diagnosis present

## 2022-04-26 DIAGNOSIS — I472 Ventricular tachycardia, unspecified: Secondary | ICD-10-CM | POA: Diagnosis not present

## 2022-04-26 DIAGNOSIS — I2510 Atherosclerotic heart disease of native coronary artery without angina pectoris: Secondary | ICD-10-CM | POA: Diagnosis not present

## 2022-04-26 DIAGNOSIS — R404 Transient alteration of awareness: Secondary | ICD-10-CM | POA: Diagnosis not present

## 2022-04-26 DIAGNOSIS — Z794 Long term (current) use of insulin: Secondary | ICD-10-CM

## 2022-04-26 DIAGNOSIS — I119 Hypertensive heart disease without heart failure: Secondary | ICD-10-CM | POA: Diagnosis present

## 2022-04-26 DIAGNOSIS — Z7189 Other specified counseling: Secondary | ICD-10-CM | POA: Diagnosis not present

## 2022-04-26 DIAGNOSIS — I110 Hypertensive heart disease with heart failure: Secondary | ICD-10-CM | POA: Diagnosis not present

## 2022-04-26 DIAGNOSIS — E669 Obesity, unspecified: Secondary | ICD-10-CM | POA: Diagnosis present

## 2022-04-26 DIAGNOSIS — Z8249 Family history of ischemic heart disease and other diseases of the circulatory system: Secondary | ICD-10-CM

## 2022-04-26 DIAGNOSIS — R519 Headache, unspecified: Secondary | ICD-10-CM | POA: Diagnosis not present

## 2022-04-26 DIAGNOSIS — I5021 Acute systolic (congestive) heart failure: Secondary | ICD-10-CM | POA: Diagnosis not present

## 2022-04-26 DIAGNOSIS — M4854XA Collapsed vertebra, not elsewhere classified, thoracic region, initial encounter for fracture: Secondary | ICD-10-CM | POA: Diagnosis present

## 2022-04-26 DIAGNOSIS — Z7902 Long term (current) use of antithrombotics/antiplatelets: Secondary | ICD-10-CM

## 2022-04-26 DIAGNOSIS — I219 Acute myocardial infarction, unspecified: Secondary | ICD-10-CM | POA: Diagnosis present

## 2022-04-26 DIAGNOSIS — I700 Atherosclerosis of aorta: Secondary | ICD-10-CM | POA: Diagnosis not present

## 2022-04-26 DIAGNOSIS — S22000A Wedge compression fracture of unspecified thoracic vertebra, initial encounter for closed fracture: Secondary | ICD-10-CM | POA: Diagnosis present

## 2022-04-26 DIAGNOSIS — Z1152 Encounter for screening for COVID-19: Secondary | ICD-10-CM | POA: Diagnosis not present

## 2022-04-26 DIAGNOSIS — Z9181 History of falling: Secondary | ICD-10-CM

## 2022-04-26 DIAGNOSIS — R001 Bradycardia, unspecified: Secondary | ICD-10-CM | POA: Diagnosis not present

## 2022-04-26 DIAGNOSIS — R296 Repeated falls: Secondary | ICD-10-CM | POA: Diagnosis present

## 2022-04-26 DIAGNOSIS — I69393 Ataxia following cerebral infarction: Secondary | ICD-10-CM | POA: Diagnosis not present

## 2022-04-26 DIAGNOSIS — Z8673 Personal history of transient ischemic attack (TIA), and cerebral infarction without residual deficits: Secondary | ICD-10-CM | POA: Diagnosis not present

## 2022-04-26 DIAGNOSIS — I2489 Other forms of acute ischemic heart disease: Secondary | ICD-10-CM | POA: Insufficient documentation

## 2022-04-26 DIAGNOSIS — I6389 Other cerebral infarction: Secondary | ICD-10-CM | POA: Diagnosis present

## 2022-04-26 DIAGNOSIS — E785 Hyperlipidemia, unspecified: Secondary | ICD-10-CM | POA: Diagnosis present

## 2022-04-26 DIAGNOSIS — E119 Type 2 diabetes mellitus without complications: Secondary | ICD-10-CM | POA: Diagnosis not present

## 2022-04-26 DIAGNOSIS — Z888 Allergy status to other drugs, medicaments and biological substances status: Secondary | ICD-10-CM

## 2022-04-26 DIAGNOSIS — S299XXA Unspecified injury of thorax, initial encounter: Secondary | ICD-10-CM | POA: Diagnosis not present

## 2022-04-26 DIAGNOSIS — Z7901 Long term (current) use of anticoagulants: Secondary | ICD-10-CM | POA: Diagnosis not present

## 2022-04-26 DIAGNOSIS — W19XXXA Unspecified fall, initial encounter: Secondary | ICD-10-CM | POA: Diagnosis not present

## 2022-04-26 DIAGNOSIS — I69320 Aphasia following cerebral infarction: Secondary | ICD-10-CM

## 2022-04-26 DIAGNOSIS — Z79899 Other long term (current) drug therapy: Secondary | ICD-10-CM | POA: Diagnosis not present

## 2022-04-26 DIAGNOSIS — I493 Ventricular premature depolarization: Secondary | ICD-10-CM | POA: Diagnosis not present

## 2022-04-26 DIAGNOSIS — I214 Non-ST elevation (NSTEMI) myocardial infarction: Secondary | ICD-10-CM

## 2022-04-26 DIAGNOSIS — C61 Malignant neoplasm of prostate: Secondary | ICD-10-CM | POA: Diagnosis present

## 2022-04-26 DIAGNOSIS — I6381 Other cerebral infarction due to occlusion or stenosis of small artery: Secondary | ICD-10-CM | POA: Diagnosis not present

## 2022-04-26 DIAGNOSIS — I69351 Hemiplegia and hemiparesis following cerebral infarction affecting right dominant side: Secondary | ICD-10-CM | POA: Diagnosis not present

## 2022-04-26 DIAGNOSIS — R000 Tachycardia, unspecified: Secondary | ICD-10-CM | POA: Diagnosis not present

## 2022-04-26 DIAGNOSIS — I959 Hypotension, unspecified: Secondary | ICD-10-CM | POA: Diagnosis not present

## 2022-04-26 DIAGNOSIS — R42 Dizziness and giddiness: Secondary | ICD-10-CM | POA: Diagnosis not present

## 2022-04-26 DIAGNOSIS — I639 Cerebral infarction, unspecified: Secondary | ICD-10-CM | POA: Diagnosis present

## 2022-04-26 DIAGNOSIS — Z66 Do not resuscitate: Secondary | ICD-10-CM | POA: Diagnosis not present

## 2022-04-26 DIAGNOSIS — I4729 Other ventricular tachycardia: Secondary | ICD-10-CM

## 2022-04-26 DIAGNOSIS — R55 Syncope and collapse: Secondary | ICD-10-CM | POA: Diagnosis not present

## 2022-04-26 DIAGNOSIS — I63219 Cerebral infarction due to unspecified occlusion or stenosis of unspecified vertebral arteries: Secondary | ICD-10-CM | POA: Diagnosis present

## 2022-04-26 DIAGNOSIS — S0990XA Unspecified injury of head, initial encounter: Secondary | ICD-10-CM | POA: Diagnosis not present

## 2022-04-26 DIAGNOSIS — Z6832 Body mass index (BMI) 32.0-32.9, adult: Secondary | ICD-10-CM

## 2022-04-26 DIAGNOSIS — I609 Nontraumatic subarachnoid hemorrhage, unspecified: Secondary | ICD-10-CM

## 2022-04-26 DIAGNOSIS — R0602 Shortness of breath: Secondary | ICD-10-CM | POA: Diagnosis not present

## 2022-04-26 DIAGNOSIS — I714 Abdominal aortic aneurysm, without rupture, unspecified: Secondary | ICD-10-CM | POA: Diagnosis present

## 2022-04-26 DIAGNOSIS — Z515 Encounter for palliative care: Secondary | ICD-10-CM | POA: Diagnosis not present

## 2022-04-26 DIAGNOSIS — Z789 Other specified health status: Secondary | ICD-10-CM | POA: Diagnosis not present

## 2022-04-26 DIAGNOSIS — Z91018 Allergy to other foods: Secondary | ICD-10-CM

## 2022-04-26 DIAGNOSIS — I480 Paroxysmal atrial fibrillation: Secondary | ICD-10-CM | POA: Diagnosis present

## 2022-04-26 DIAGNOSIS — I69322 Dysarthria following cerebral infarction: Secondary | ICD-10-CM

## 2022-04-26 LAB — ECHOCARDIOGRAM COMPLETE
Area-P 1/2: 3.3 cm2
Calc EF: 13 %
Height: 67 in
Single Plane A2C EF: 13.3 %
Single Plane A4C EF: 19.9 %
Weight: 3456.81 oz

## 2022-04-26 LAB — HEPATIC FUNCTION PANEL
ALT: 15 U/L (ref 0–44)
AST: 21 U/L (ref 15–41)
Albumin: 3.5 g/dL (ref 3.5–5.0)
Alkaline Phosphatase: 40 U/L (ref 38–126)
Bilirubin, Direct: 0.1 mg/dL (ref 0.0–0.2)
Indirect Bilirubin: 0.6 mg/dL (ref 0.3–0.9)
Total Bilirubin: 0.7 mg/dL (ref 0.3–1.2)
Total Protein: 6.5 g/dL (ref 6.5–8.1)

## 2022-04-26 LAB — DIFFERENTIAL
Abs Immature Granulocytes: 0.01 10*3/uL (ref 0.00–0.07)
Basophils Absolute: 0 10*3/uL (ref 0.0–0.1)
Basophils Relative: 0 %
Eosinophils Absolute: 0.1 10*3/uL (ref 0.0–0.5)
Eosinophils Relative: 1 %
Immature Granulocytes: 0 %
Lymphocytes Relative: 10 %
Lymphs Abs: 0.5 10*3/uL — ABNORMAL LOW (ref 0.7–4.0)
Monocytes Absolute: 0.5 10*3/uL (ref 0.1–1.0)
Monocytes Relative: 9 %
Neutro Abs: 4.3 10*3/uL (ref 1.7–7.7)
Neutrophils Relative %: 80 %

## 2022-04-26 LAB — BASIC METABOLIC PANEL
Anion gap: 8 (ref 5–15)
BUN: 25 mg/dL — ABNORMAL HIGH (ref 8–23)
CO2: 22 mmol/L (ref 22–32)
Calcium: 9 mg/dL (ref 8.9–10.3)
Chloride: 108 mmol/L (ref 98–111)
Creatinine, Ser: 1.2 mg/dL (ref 0.61–1.24)
GFR, Estimated: >60 mL/min (ref >60)
Glucose, Bld: 130 mg/dL — ABNORMAL HIGH (ref 70–99)
Potassium: 4.3 mmol/L (ref 3.5–5.1)
Sodium: 138 mmol/L (ref 135–145)

## 2022-04-26 LAB — URINALYSIS, ROUTINE W REFLEX MICROSCOPIC
Bilirubin Urine: NEGATIVE
Glucose, UA: NEGATIVE mg/dL
Hgb urine dipstick: NEGATIVE
Ketones, ur: NEGATIVE mg/dL
Leukocytes,Ua: NEGATIVE
Nitrite: NEGATIVE
Protein, ur: NEGATIVE mg/dL
Specific Gravity, Urine: 1.02 (ref 1.005–1.030)
pH: 5 (ref 5.0–8.0)

## 2022-04-26 LAB — HEMOGLOBIN AND HEMATOCRIT, BLOOD
HCT: 35.6 % — ABNORMAL LOW (ref 39.0–52.0)
HCT: 33.9 % — ABNORMAL LOW (ref 39.0–52.0)
Hemoglobin: 12.1 g/dL — ABNORMAL LOW (ref 13.0–17.0)
Hemoglobin: 11.8 g/dL — ABNORMAL LOW (ref 13.0–17.0)

## 2022-04-26 LAB — CBC
HCT: 36 % — ABNORMAL LOW (ref 39.0–52.0)
Hemoglobin: 11.9 g/dL — ABNORMAL LOW (ref 13.0–17.0)
MCH: 32.5 pg (ref 26.0–34.0)
MCHC: 33.1 g/dL (ref 30.0–36.0)
MCV: 98.4 fL (ref 80.0–100.0)
Platelets: 90 10*3/uL — ABNORMAL LOW (ref 150–400)
RBC: 3.66 MIL/uL — ABNORMAL LOW (ref 4.22–5.81)
RDW: 13.2 % (ref 11.5–15.5)
WBC: 5.4 10*3/uL (ref 4.0–10.5)
nRBC: 0 % (ref 0.0–0.2)

## 2022-04-26 LAB — RESP PANEL BY RT-PCR (RSV, FLU A&B, COVID)  RVPGX2
Influenza A by PCR: NEGATIVE
Influenza B by PCR: NEGATIVE
Resp Syncytial Virus by PCR: NEGATIVE
SARS Coronavirus 2 by RT PCR: NEGATIVE

## 2022-04-26 LAB — TROPONIN I (HIGH SENSITIVITY)
Troponin I (High Sensitivity): 22 ng/L — ABNORMAL HIGH (ref <18)
Troponin I (High Sensitivity): 232 ng/L (ref <18)
Troponin I (High Sensitivity): 909 ng/L (ref <18)
Troponin I (High Sensitivity): 3742 ng/L (ref <18)

## 2022-04-26 LAB — GLUCOSE, CAPILLARY
Glucose-Capillary: 105 mg/dL — ABNORMAL HIGH (ref 70–99)
Glucose-Capillary: 151 mg/dL — ABNORMAL HIGH (ref 70–99)

## 2022-04-26 LAB — HEPARIN LEVEL (UNFRACTIONATED): Heparin Unfractionated: 0.27 IU/mL — ABNORMAL LOW (ref 0.30–0.70)

## 2022-04-26 LAB — MAGNESIUM: Magnesium: 1.9 mg/dL (ref 1.7–2.4)

## 2022-04-26 MED ORDER — AMIODARONE HCL IN DEXTROSE 360-4.14 MG/200ML-% IV SOLN
60.0000 mg/h | INTRAVENOUS | Status: AC
  Administered 2022-04-26 (×2): 60 mg/h via INTRAVENOUS
  Filled 2022-04-26 (×2): qty 200

## 2022-04-26 MED ORDER — NITROGLYCERIN 0.4 MG SL SUBL: 0.4000 mg | SUBLINGUAL_TABLET | SUBLINGUAL | Status: DC | PRN

## 2022-04-26 MED ORDER — ONDANSETRON HCL 4 MG/2ML IJ SOLN: 4.0000 mg | Freq: Four times a day (QID) | INTRAMUSCULAR | Status: DC | PRN

## 2022-04-26 MED ORDER — INSULIN ASPART 100 UNIT/ML IJ SOLN: 0.0000 [IU] | Freq: Every day | INTRAMUSCULAR | Status: DC

## 2022-04-26 MED ORDER — OYSTER SHELL CALCIUM/D3 500-5 MG-MCG PO TABS
1.0000 | ORAL_TABLET | Freq: Every day | ORAL | Status: DC
  Administered 2022-04-26 – 2022-04-29 (×4): 1 via ORAL
  Filled 2022-04-26 (×4): qty 1

## 2022-04-26 MED ORDER — HEPARIN BOLUS VIA INFUSION
4000.0000 [IU] | Freq: Once | INTRAVENOUS | Status: AC
  Administered 2022-04-26: 4000 [IU] via INTRAVENOUS
  Filled 2022-04-26: qty 4000

## 2022-04-26 MED ORDER — SODIUM CHLORIDE 0.9 % IV SOLN: INTRAVENOUS | Status: DC

## 2022-04-26 MED ORDER — INSULIN ASPART 100 UNIT/ML IJ SOLN: 0.0000 [IU] | Freq: Three times a day (TID) | INTRAMUSCULAR | Status: DC

## 2022-04-26 MED ORDER — ACETAMINOPHEN 325 MG PO TABS: 650.0000 mg | ORAL_TABLET | Freq: Three times a day (TID) | ORAL | Status: DC | PRN

## 2022-04-26 MED ORDER — AMIODARONE IV BOLUS ONLY 150 MG/100ML
150.0000 mg | Freq: Once | INTRAVENOUS | Status: AC
  Administered 2022-04-26: 150 mg via INTRAVENOUS
  Filled 2022-04-26: qty 100

## 2022-04-26 MED ORDER — ATORVASTATIN CALCIUM 80 MG PO TABS
80.0000 mg | ORAL_TABLET | Freq: Every day | ORAL | Status: DC
  Administered 2022-04-26 – 2022-04-28 (×3): 80 mg via ORAL
  Filled 2022-04-26 (×3): qty 1

## 2022-04-26 MED ORDER — CLOPIDOGREL BISULFATE 75 MG PO TABS
75.0000 mg | ORAL_TABLET | Freq: Every day | ORAL | Status: DC
  Administered 2022-04-26 – 2022-04-27 (×2): 75 mg via ORAL
  Filled 2022-04-26 (×2): qty 1

## 2022-04-26 MED ORDER — METOPROLOL TARTRATE 12.5 MG HALF TABLET
12.5000 mg | ORAL_TABLET | Freq: Two times a day (BID) | ORAL | Status: DC
  Administered 2022-04-26 – 2022-04-27 (×3): 12.5 mg via ORAL
  Filled 2022-04-26 (×3): qty 1

## 2022-04-26 MED ORDER — HEPARIN (PORCINE) 25000 UT/250ML-% IV SOLN
1200.0000 [IU]/h | INTRAVENOUS | Status: DC
  Administered 2022-04-26: 1000 [IU]/h via INTRAVENOUS
  Administered 2022-04-27: 1200 [IU]/h via INTRAVENOUS
  Filled 2022-04-26 (×2): qty 250

## 2022-04-26 MED ORDER — AMIODARONE HCL IN DEXTROSE 360-4.14 MG/200ML-% IV SOLN
30.0000 mg/h | INTRAVENOUS | Status: DC
  Administered 2022-04-26 – 2022-04-27 (×3): 30 mg/h via INTRAVENOUS
  Filled 2022-04-26 (×2): qty 200

## 2022-04-26 NOTE — Progress Notes (Signed)
   04/26/22 1955  Assess: MEWS Score  Temp 98.6 F (37 C)  BP 120/78  MAP (mmHg) 87  Pulse Rate 88  ECG Heart Rate (!) 113  Resp (!) 23  SpO2 98 %  O2 Device Room Air  Assess: MEWS Score  MEWS Temp 0  MEWS Systolic 0  MEWS Pulse 2  MEWS RR 1  MEWS LOC 0  MEWS Score 3  MEWS Score Color Yellow  Assess: if the MEWS score is Yellow or Red  Were vital signs taken at a resting state? Yes  Focused Assessment No change from prior assessment  Does the patient meet 2 or more of the SIRS criteria? Yes  Does the patient have a confirmed or suspected source of infection? No  MEWS guidelines implemented *See Row Information* Yes  Treat  Pain Scale 0-10  Pain Score 0  Take Vital Signs  Increase Vital Sign Frequency  Yellow: Q 2hr X 2 then Q 4hr X 2, if remains yellow, continue Q 4hrs  Escalate  MEWS: Escalate Yellow: discuss with charge nurse/RN and consider discussing with provider and RRT  Notify: Charge Nurse/RN  Name of Charge Nurse/RN Notified Fred RN  Date Charge Nurse/RN Notified 04/26/22  Time Charge Nurse/RN Notified 1955  Document  Progress note created (see row info) Yes  Assess: SIRS CRITERIA  SIRS Temperature  0  SIRS Pulse 1  SIRS Respirations  1  SIRS WBC 0  SIRS Score Sum  2

## 2022-04-26 NOTE — Consult Note (Addendum)
Cardiology Consultation   Patient ID: Jose Beck MRN: [id number]; DOB: 1945-06-14  Admit date: 04/26/2022 Date of Consult: 04/26/2022  PCP:  [name], MD   [place] [facility] Cardiologist:  Peter Martinique, MD     Patient Profile:   Jose Beck is a 76 y.o. male with a hx of CAD (known occlusion of LAD and RCA by cath in 1992 with repeat cath in 2017 showing similar findings with collateral flow noted), chronic diastolic CHF, HTN, HLD, type 2 DM, GERD, history of CVA, AAA, and history of falls who is being seen 04/26/2022 for the evaluation of chest pain, atrial fibrillation at the request of Dr. Billy Fischer.  History of Present Illness:   Jose Beck is a 76 year old male with above medical history who is followed by Dr. Martinique. Per chart review, patient has a long history of CAD. Underwent cardiac catheterization with Dr. Wynonia Lawman in 1992 that showed occlusive disease involving the RCA and LAD with collateral circulation. Did not have PCI or CABG at that time, and was instead managed medically for several years. 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   Patient later established care with Dr. Martinique in 11/2015 when he was admitted for evaluation of cheat pain. Echocardiogram on 11/16/2015 showed EF 55-60%. Cath on 11/16/2015 showed total occlusion of the proximal RCA and proximal-mid LAD. Noted that RCA was heavily calcified and filled by collaterals from the circumflex. Circumflex was widely patent. No PCI, instead patient was medically managed. In 2018, patient was found to have a 4.6 cm abdominal aortic aneurysm and established care with vascular surgery. Aneurysm grew to 5.7 cm, and patient underwent abdominal aortic endovascular stent graft on 11/25/2017. Last seen by vascular surgery in 01/2022-- noted to have an endoleak, presumably type 2, with interval enlargement of the excluded aneurysm sac measuring 6.4 cm. Plans to follow up in 6 months to determine if  surgery is necessary.   Patient was most recently seen by cardiology during an admission in 12/2021 for evaluation of fall, possible syncope. Patient had a mechanical fall, and he hit his head on the door handle. Unclear if he had a syncopal episode prior to fall. 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 As It was unclear whether patient had syncope, and as echo had normal EF, no further cardiac workup was pursued. Note that patient did have a traumatic acute subarachnoid hemorrhage and acute subdural hematoma. Seen by neurosurgery who recommended prophylactic Keppra for seizure prophylaxis. No surgical intervention planned.   Patient presented to the ED on 12/16 complaining of chest pain. Reportedly, patient woke up with crushing chest pain. Tried to take a nitro, but it was ineffective. EMS EKG showed atrial fibrillation with RVR, HR 150s-190s. Upon arrival to the ED, HR was 141 BPM, BP 94/67. EKG in the ED showed atrial fibrillation with HR 147 BPM. CXR showed no evidence of acute cardiopulmonary disease. Labs showed WBC 5.4, hemoglobin 11.9, platelets 90. Na 138, K 4.3, creatinine 1.20. hsTn 22>232  On interview, patient reports that he was in his usual state of health up until this morning.  Denies having recent shortness of breath, dyspnea on exertion, chest pain, palpitations, fatigue, nausea, weakness, cough, nasal congestion.  Patient woke up this morning and felt left sided chest pain.  Pain was described as pressure.  Pain did not radiate.  He tried to take antacid medications and a nitroglycerin tablet, however he did not have improvement in symptoms.  Denies having any symptoms besides chest pain.  Currently in the ED, he is symptom-free.  Reports that he feels like he is in his usual state of health  without chest pain, palpitations, shortness of breath.  Past Medical History:  Diagnosis Date   Abdominal aortic aneurysm (AAA) ([place])    Aortic stenosis    a. 11/2015 Echo: EF 55-60%, Gr1 DD, mild AS.   Chronic combined systolic and diastolic CHF (congestive heart failure) ([place])    a. 07/2015: TEE showing EF of 40% b. 11/2015: Echo w/ EF 55-60%, Grade 1 DD, mild AS.   Coronary artery disease    a. patient reports 100% stenosis of LAD and RCA by cath in 1999. b. 11/2015 Cath: LM nl, LAD 100 - fills by L->L collats from D1, LCX 60d, OM1 lalrge/nl, OM2 small/nl, RCA 153m L->R collats.EF 35-45% (55-60% by echo).   Di[place]    Type II   GERD (gastroesophageal reflux disease)    Hyperlipemia    Hypertension    Hypertensive heart disease    [place]    a. 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   [place]rombocytopenia ([place] 11/2015    Past Surgical History:  Procedure Laterality Date   ABDOMINAL AORTIC ENDOVASCULAR STENT GRAFT N/A 11/25/2017   Procedure: ABDOMINAL AORTIC ENDOVASCULAR STENT GRAFT;  Surgeon: BSerafina Mitchell MD;  Location: [facility];  Service: Vascular;  Laterality: N/A;   CARDIAC CATHETERIZATION     CARDIAC CATHETERIZATION N/A 11/16/2015   Procedure: Left Heart Cath and Coronary Angiography;  Surgeon: HBelva Crome MD;  [facility];  Service: Cardiovascular;  Laterality: N/A;   MOLE REMOVAL     TONSILLECTOMY       Home Medications:  Prior to Admission medications   Medication Sig Start Date End Date Taking? Authorizing Provider  acetaminophen (TYLENOL) 650 MG CR tablet Take 650 mg by mouth every 8 (eight) hours as needed for pain.    [provider]  atorvastatin (LIPITOR) 80 MG tablet Take 1 tablet (80 mg total) by mouth daily at 6 PM. 04/25/16   JMartinique Peter M, MD  calcium-vitamin D 250-100 MG-UNIT tablet Take 1 tablet by mouth daily. Takes in morning '600mg'$     [provider]  carvedilol (COREG) 25 MG  tablet Take 25 mg by mouth 2 (two) times daily with a meal.    [provider]  COVID-19 mRNA vaccine [id number](COMIRNATY) syringe Inject into the muscle. 03/17/22   SCarlyle Basques MD  insulin detemir (LEVEMIR) 100 UNIT/ML injection Inject 15 Units into the skin daily.    [provider]  losartan (COZAAR) 50 MG tablet Take 50 mg by mouth daily. 12/30/21   [provider]  nitroGLYCERIN (NITROSTAT) 0.4 MG SL tablet Place 0.4 mg under the tongue every 5 (five) minutes as needed for chest pain (x 3 doses).    [provider]  tacrolimus (PROTOPIC) 0.1 % ointment Apply 1 application topically daily as needed (facial scaling).    [provider]    Inpatient Medications: Scheduled Meds:  metoprolol tartrate  12.5 mg Oral BID   Continuous Infusions:  amiodarone 60 mg/hr (04/26/22 0744)   amiodarone     PRN Meds:   Allergies:    Allergies  Allergen Reactions   Cheese Nausea And Vomiting   Pravastatin  Anxiety    Anxiety attack    Social History:   Social History   Socioeconomic History   Marital status: Married    Spouse name: [name]   Number of children: 0   Years of education: College   Highest education level: Not on file  Occupational History   Occupation: Retired   Tobacco Use   Smoking status: Never   Smokeless tobacco: Never  Vaping Use   Vaping Use: Never used  Substance and Sexual Activity   Alcohol use: Yes    Alcohol/week: 1.0 standard drink of alcohol    Types: 1 Shots of liquor per week    Comment: cocktails every day   Drug use: No   Sexual activity: Not on file  Other Topics Concern   Not on file  Social History Narrative   Drinks coffee daily    Social Determinants of Health   Financial Resource Strain: Not on file  Food Insecurity: Not on file  Transportation Needs: Not on file  Physical Activity: Not on file  Stress: Not on file  Social Connections: Not on file  Intimate Partner Violence: Not on file     Family History:    Family History  Problem Relation Age of Onset   Hypertension Mother    Heart disease Mother      ROS:  Please see the history of present illness.   All other ROS reviewed and negative.     Physical Exam/Data:   Vitals:   04/26/22 1045 04/26/22 1100 04/26/22 1130 04/26/22 1133  BP: 97/83 111/79 111/85   Pulse: (!) 103 (!) 134 (!) 157   Resp: (!) '21 12 17   '$ Temp:    98 F (36.7 C)  TempSrc:    Oral  SpO2: 96% 99% 98%    No intake or output data in the 24 hours ending 04/26/22 1209    01/27/2022   10:53 AM 12/21/2021    5:36 AM 12/19/2021    9:23 PM  Last 3 Weights  Weight (lbs) 217 lb 216 lb 14.9 oz 240 lb  Weight (kg) 98.431 kg 98.4 kg 108.863 kg     There is no height or weight on file to calculate BMI.  General:  Well nourished, well developed, in no acute distress. Sitting upright in the bed. Wearing pacer pads  HEENT: normal Neck: no JVD Vascular: Radial and dorsalis pedis pulses 2+ bilaterally  Cardiac:  normal S1, S2; irregular rate and rhythm, tachycardia. Grade 2/6 systolic murmur present  Lungs:  clear to auscultation bilaterally, no wheezing, rhonchi or rales. Appears to be SOB while speaking  Abd: soft, nontender, no hepatomegaly  Ext: no edema in bilateral lower extremities  Musculoskeletal:  No deformities, BUE and BLE strength normal and equal Skin: warm and dry  Neuro:  CNs 2-12 intact, no focal abnormalities noted Psych:  Normal affect   EKG:  The EKG was personally reviewed and demonstrates:  atrial fibrillation, HR 147 BPM Telemetry:  Telemetry was personally reviewed and demonstrates:  Atrial fibrillation, PVCs, occasional runs of NSVT   Relevant CV Studies:   Laboratory Data:  High Sensitivity Troponin:   Recent Labs  Lab 04/26/22 0730 04/26/22 0851  TROPONINIHS 22* 232*     Chemistry Recent Labs  Lab 04/26/22 0730  NA 138  K 4.3  CL 108  CO2 22  GLUCOSE 130*  BUN 25*  CREATININE 1.20  CALCIUM 9.0  MG  1.9  GFRNONAA >60  ANIONGAP 8  Recent Labs  Lab 04/26/22 0730  PROT 6.5  ALBUMIN 3.5  AST 21  ALT 15  ALKPHOS 40  BILITOT 0.7   Lipids No results for input(s): "CHOL", "TRIG", "HDL", "LABVLDL", "LDLCALC", "CHOLHDL" in the last 168 hours.  Hematology Recent Labs  Lab 04/26/22 0730  WBC 5.4  RBC 3.66*  HGB 11.9*  HCT 36.0*  MCV 98.4  MCH 32.5  MCHC 33.1  RDW 13.2  PLT 90*   Thyroid No results for input(s): "TSH", "FREET4" in the last 168 hours.  BNPNo results for input(s): "BNP", "PROBNP" in the last 168 hours.  DDimer No results for input(s): "DDIMER" in the last 168 hours.   Radiology/Studies:  DG Chest Portable 1 View  Result Date: 04/26/2022 CLINICAL DATA:  75 year old male with history of chest pain and shortness of breath. EXAM: PORTABLE CHEST 1 VIEW COMPARISON:  Chest x-ray 12/19/2021. FINDINGS: Lung volumes are normal. No consolidative airspace disease. No pleural effusions. No pneumothorax. No pulmonary nodule or mass noted. Pulmonary vasculature and the cardiomediastinal silhouette are within normal limits. Atherosclerosis in the thoracic aorta. IMPRESSION: 1.  No radiographic evidence of acute cardiopulmonary disease. 2. Aortic atherosclerosis. Electronically Signed   By: Vinnie Langton M.D.   On: 04/26/2022 07:25     Assessment and Plan:   Atrial Fibrillation, New onset, with RVR  PVCs, NSVT  - Patient presented complaining of chest pain, found to be in new onset atrial fibrillation with HR in the 150s-190s - BP initially 96/67, so EDP started IV amiodarone - While on IV amiodarone, HR has improved. Now ranging from the 100s-120s. Telemetry also shows frequent PVCs and occasional runs of NSVT - Patient on pacing pads for NSVT-- note, patient was found to have ventricular bigeminy during admission in 12/2021, but did not wear a monitor. Unclear if PVCs are increasing in frequency this admission. May need monitor going home  - Patient asymptomatic -  CHADS-VASc 7-- ideally we will start [facility] this admission. However, patient had a mechanical fall and traumatic acute subarachnoid hemorrhage/acute subdural hematoma in 12/2021. Head CT this admission pending.  - If head CT does not show signs of bleeding, plan to start anticoagulation. - Continue IV amiodarone - Started metoprolol 12.5 mg BID for PVC suppression and HR control  - Ordered echocardiogram  - Ordered TSH to be drawn tomorrow AM  - Maintain K>4, mag>2  Addendum: third troponin came back elevated to 909. Head CT negative. Starting IV heparin per pharmacy consult.  Elevated Troponin  CAD  - hsTn 22>232  - Patient has a history of CAD with known occlusion of the RCA and LAD - He did have chest pain earlier, but this resolved as his HR improved. Suspect that trop elevation is demand ischemia in the setting of afib with RVR (HR was in the 150s-190s initially) and known CAD, but will check a third troponin to better trend  - Ordered echo as above  - Continue lipitor 80 mg daily, plavix   HTN  - BP low end of normal in ED - Hold home losartan, carvedilol   Dilation of ascending aorta  - Echo from 12/2021 showed mild dilation of the ascending aorta measuring 44 mm - Repeat echo pending   AAA - Followed by vascular surgery     Risk Assessment/Risk Scores:      CHA2DS2-VASc Score = 7  This indicates a 11.2% annual risk of stroke. The patient's score is based upon: CHF History: 0 HTN History: 1 Diabetes History:  1 Stroke History: 2 Vascular Disease History: 1 Age Score: 2 Gender Score: 0        For questions or updates, please contact [place] Please consult [email or web address] for contact info under    Signed, Margie Billet, PA-C  04/26/2022 12:09 PM   Patient seen and examined.  Agree with above documentation.  Jose Beck is a 76 year old male with a history of CAD (cath in 2017 showed occluded mid RCA, occluded mid LAD, 70% ostial D1, 60% distal  LCx, medical management recommended), chronic diastolic heart failure, J6OT, hypertension, AAA, CVA who we are consulted for new onset atrial fibrillation at the request of Dr. Billy Fischer.  Most recent echo 12/2021 showed EF 60 to 65%, normal RV function, mild to moderate aortic stenosis, ascending aortic dilatation measuring 44 mm.  Had a fall with traumatic acute subarachnoid hemorrhage and subdural hematoma in August 2023.  He was seen by neurosurgery, did not recommend surgical intervention.  He presented this morning with chest pain.  Reports he woke up with chest pain.  On arrival, vital signs notable for BP 94/67, pulse 141, SpO2 95% on room air.  Labs notable for creatinine 1.20, sodium 138, potassium 4.3, magnesium 1.9, WBC 5.4, hemoglobin 11.9, platelets 90, troponin 22 > 232 > 909.  EKG shows atrial fibrillation, rate 147, poor wave progression.  Chest ray unremarkable.  Head CT shows no acute intracranial abnormality.  On exam, patient is alert, irregular rhythm, tachycardic, 2 out of 6 systolic murmur, lungs CTAB, no LE edema.  For his A-fib, he was started on IV amiodarone with improvement in heart rates, now ranging 100s to 120s.  Continue amiodarone and will add metoprolol for further rate control.  Check echocardiogram.  If difficult to rate control or EF reduced suggesting likely tachycardia mediated cardiomyopathy, would plan for TEE/DCCV.  Suspect troponin elevation represents demand ischemia due to A-fib with RVR in setting of known underlying obstructive CAD.  Donato Heinz, MD

## 2022-04-26 NOTE — Progress Notes (Signed)
ANTICOAGULATION CONSULT NOTE - Initial Consult  Pharmacy Consult for heparin  Indication: chest pain/ACS  Allergies  Allergen Reactions   Cheese Nausea And Vomiting   Pravastatin Anxiety    Anxiety attack    Patient Measurements: Weight: 98 kg (216 lb 0.8 oz) Heparin Dosing Weight: 87.2kg   Vital Signs: Temp: 98 F (36.7 C) (12/16 1133) Temp Source: Oral (12/16 1133) BP: 105/63 (12/16 1330) Pulse Rate: 78 (12/16 1330)  Labs: Recent Labs    04/26/22 0730 04/26/22 0851 04/26/22 1242  HGB 11.9*  --   --   HCT 36.0*  --   --   PLT 90*  --   --   CREATININE 1.20  --   --   TROPONINIHS 22* 232* 909*    Estimated Creatinine Clearance: 58.4 mL/min (by C-G formula based on SCr of 1.2 mg/dL).   Medical History: Past Medical History:  Diagnosis Date   Abdominal aortic aneurysm (AAA) ([place])    Aortic stenosis    a. 11/2015 Echo: EF 55-60%, Gr1 DD, mild AS.   Chronic combined systolic and diastolic CHF (congestive heart failure) ([place])    a. 07/2015: TEE showing EF of 40% b. 11/2015: Echo w/ EF 55-60%, Grade 1 DD, mild AS.   Coronary artery disease    a. patient reports 100% stenosis of LAD and RCA by cath in 1999. b. 11/2015 Cath: LM nl, LAD 100 - fills by L->L collats from D1, LCX 60d, OM1 lalrge/nl, OM2 small/nl, RCA 154m L->R collats.EF 35-45% (55-60% by echo).   Diabetes mellitus without complication ([place]    Type II   GERD (gastroesophageal reflux disease)    Hyperlipemia    Hypertension    Hypertensive heart disease    St[place] ([place]    a. 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   Thrombocytopenia ([facility] 11/2015    Assessment: Patient presented with chest pain and was found to be in afib with RVR. Trop elevated to 909. Patient not on anticoagulation PTA, CHADS-VASc 7. Pharmacy consulted to dose heparin with indication of ACS/STEMI.   Goal of Therapy:  Heparin level 0.3-0.7 units/ml Monitor platelets by anticoagulation protocol: Yes   Plan:  Give 4000 units  bolus x 1 Start heparin infusion at 1000 units/hr Check anti-Xa level in 8 hours and daily while on heparin Continue to monitor H&H and platelets  HEsmeralda Arthur PharmD, BCCCP  04/26/2022,2:01 PM

## 2022-04-26 NOTE — ED Notes (Signed)
ED TO INPATIENT HANDOFF REPORT  ED Nurse Name and Phone #: Luetta Nutting [phone]  S Name/Age/Gender Jose Beck 76 y.o. male Room/Bed: 021C/021C  Code Status   Code Status: DNR  Home/SNF/Other Home Patient oriented to: self, place, time, and situation Is this baseline? Yes   Triage Complete: Triage complete  Chief Complaint Atrial fibrillation with tachycardic ventricular rate ([place]) [I48.91]  Triage Note Pt bib gcems woke up this morning with crushing chest pain 5/10. Pt had nitro at home but it was expired, took one but was ineffective. Ems gave 324 of asa at residence. Ems EKG showed afib rvr with hr of 150-190. 20g placed in left forearm with ems, 10 of Cardizem administered with ems., as well as 500 fluid bolus.   Allergies Allergies  Allergen Reactions   Cheese Nausea And Vomiting   Pravastatin Anxiety    Anxiety attack    Level of Care/Admitting Diagnosis ED Disposition     ED Disposition  Admit   Condition  --   [place]: [place] [[id number]]  Level of Care: Progressive [102]  Admit to Progressive based on following criteria: CARDIOVASCULAR & THORACIC of moderate stability with acute coronary syndrome symptoms/low risk myocardial infarction/hypertensive urgency/arrhythmias/heart failure potentially compromising stability and stable post cardiovascular intervention patients.  May admit patient to [name] or [facility] if equivalent level of care is available:: No  Covid Evaluation: Asymptomatic - no recent exposure (last 10 days) testing not required  Diagnosis: Atrial fibrillation with tachycardic ventricular rate [facility]) [[id number]]  Admitting Physician: Guilford Shi [[id number]]  Attending Physician: Guilford Shi [[id number]]  Certification:: I certify this patient will need inpatient services for at least 2 midnights  Estimated Length of Stay: 4          B Medical/Surgery History Past Medical History:  Diagnosis  Date   Abdominal aortic aneurysm (AAA) ([place])    Aortic stenosis    a. 11/2015 Echo: EF 55-60%, Gr1 DD, mild AS.   Chronic combined systolic and diastolic CHF (congestive heart failure) ([place])    a. 07/2015: TEE showing EF of 40% b. 11/2015: Echo w/ EF 55-60%, Grade 1 DD, mild AS.   Coronary artery disease    a. patient reports 100% stenosis of LAD and RCA by cath in 1999. b. 11/2015 Cath: LM nl, LAD 100 - fills by L->L collats from D1, LCX 60d, OM1 lalrge/nl, OM2 small/nl, RCA 171m L->R collats.EF 35-45% (55-60% by echo).   Diabetes mellitus without complication ([place]    Type II   GERD (gastroesophageal reflux disease)    Hyperlipemia    Hypertension    Hypertensive heart disease    Stroke ([place]    a. 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   Thrombocytopenia ([place] 11/2015   Past Surgical History:  Procedure Laterality Date   ABDOMINAL AORTIC ENDOVASCULAR STENT GRAFT N/A 11/25/2017   Procedure: ABDOMINAL AORTIC ENDOVASCULAR STENT GRAFT;  Surgeon: BSerafina Mitchell MD;  Location: [facility] OR;  Service: Vascular;  Laterality: N/A;   CARDIAC CATHETERIZATION     CARDIAC CATHETERIZATION N/A 11/16/2015   Procedure: Left Heart Cath and Coronary Angiography;  Surgeon: HBelva Crome MD;  [facility];  Service: Cardiovascular;  Laterality: N/A;   MOLE REMOVAL     TONSILLECTOMY       A IV Location/Drains/Wounds Patient Lines/Drains/Airways Status     Active Line/Drains/Airways     Name Placement date Placement time Site Days   Peripheral IV 04/26/22  20 G Left;Posterior Forearm 04/26/22  0646  Forearm  less than 1   Peripheral IV 04/26/22 20 G Anterior;Distal;Right Forearm 04/26/22  0831  Forearm  less than 1   Incision (Closed) 11/25/17 Groin Left 11/25/17  1551  -- 1613   Incision (Closed) 11/25/17 Groin Right 11/25/17  1551  -- 1613   Wound / Incision (Open or Dehisced) 12/20/21 Skin tear Arm Distal;Lower;Posterior;Right skin tear at wrist and on knuckles of the right hand  12/20/21  0800  Arm  127            Intake/Output Last 24 hours No intake or output data in the 24 hours ending 04/26/22 1446  Labs/Imaging Results for orders placed or performed during the hospital encounter of 04/26/22 (from the past 48 hour(s))  Basic metabolic panel     Status: Abnormal   Collection Time: 04/26/22  7:30 AM  Result Value Ref Range   Sodium 138 135 - 145 mmol/L   Potassium 4.3 3.5 - 5.1 mmol/L   Chloride 108 98 - 111 mmol/L   CO2 22 22 - 32 mmol/L   Glucose, Bld 130 (H) 70 - 99 mg/dL    Comment: Glucose reference range applies only to samples taken after fasting for at least 8 hours.   BUN 25 (H) 8 - 23 mg/dL   Creatinine, Ser 1.20 0.61 - 1.24 mg/dL   Calcium 9.0 8.9 - 10.3 mg/dL   GFR, Estimated >60 >60 mL/min    Comment: (NOTE) Calculated using the CKD-EPI Creatinine Equation (2021)    Anion gap 8 5 - 15    Comment: Performed at [facility] [address], [place], [place] [address]  Magnesium     Status: None   Collection Time: 04/26/22  7:30 AM  Result Value Ref Range   Magnesium 1.9 1.7 - 2.4 mg/dL    Comment: Performed at [facility] [address], [place], [place] [address]  CBC     Status: Abnormal   Collection Time: 04/26/22  7:30 AM  Result Value Ref Range   WBC 5.4 4.0 - 10.5 K/uL   RBC 3.66 (L) 4.22 - 5.81 MIL/uL   Hemoglobin 11.9 (L) 13.0 - 17.0 g/dL   HCT 36.0 (L) 39.0 - 52.0 %   MCV 98.4 80.0 - 100.0 fL   MCH 32.5 26.0 - 34.0 pg   MCHC 33.1 30.0 - 36.0 g/dL   RDW 13.2 11.5 - 15.5 %   Platelets 90 (L) 150 - 400 K/uL    Comment: Immature Platelet Fraction may be clinically indicated, consider ordering this additional test [id number] REPEATED TO VERIFY PLATELET COUNT CONFIRMED BY SMEAR    nRBC 0.0 0.0 - 0.2 %    Comment: Performed at [facility], [place] [address] [place], [place] [address]  Troponin I (High Sensitivity)     Status: Abnormal   Collection Time: 04/26/22  7:30 AM  Result Value Ref Range    Troponin I (High Sensitivity) 22 (H) <18 ng/L    Comment: (NOTE) Elevated high sensitivity troponin I (hsTnI) values and significant  changes across serial measurements may suggest ACS but many other  chronic and acute conditions are known to elevate hsTnI results.  Refer to the "Links" section for chest pain algorithms and additional  guidance. Performed at [facility], [place] [address], [place], [place] [address]   Differential     Status: Abnormal   Collection Time: 04/26/22  7:30 AM  Result Value Ref Range   Neutrophils Relative % 80 %   Neutro Abs 4.3 1.7 - 7.7 K/uL   Lymphocytes Relative 10 %   Lymphs Abs 0.5 (L) 0.7 - 4.0 K/uL   Monocytes Relative 9 %   Monocytes Absolute 0.5 0.1 - 1.0 K/uL   Eosinophils Relative 1 %   Eosinophils Absolute 0.1 0.0 - 0.5 K/uL   Basophils Relative 0 %   Basophils Absolute 0.0 0.0 - 0.1 K/uL   Immature Granulocytes 0 %   Abs Immature Granulocytes 0.01 0.00 - 0.07 K/uL    Comment: Performed at [facility] [address] [place], [place] [address]  Hepatic function panel     Status: None   Collection Time: 04/26/22  7:30 AM  Result Value Ref Range   Total Protein 6.5 6.5 - 8.1 g/dL   Albumin 3.5 3.5 - 5.0 g/dL   AST 21 15 - 41 U/L   ALT 15 0 - 44 U/L   Alkaline Phosphatase 40 38 - 126 U/L   Total Bilirubin 0.7 0.3 - 1.2 mg/dL   Bilirubin, Direct 0.1 0.0 - 0.2 mg/dL   Indirect Bilirubin 0.6 0.3 - 0.9 mg/dL    Comment: Performed at [facility] [address] [place], [place] [address]  Resp panel by RT-PCR (RSV, Flu A&B, Covid) Anterior Nasal Swab     Status: None   Collection Time: 04/26/22  8:31 AM   Specimen: Anterior Nasal Swab  Result Value Ref Range   SARS Coronavirus 2 by RT PCR NEGATIVE NEGATIVE    Comment: (NOTE) SARS-CoV-2 target nucleic acids are NOT DETECTED.  The SARS-CoV-2 RNA is generally detectable in upper respiratory specimens during the acute phase of infection. The lowest concentration of  SARS-CoV-2 viral copies this assay can detect is 138 copies/mL. A negative result does not preclude SARS-Cov-2 infection and should not be used as the sole basis for treatment or other patient management decisions. A negative result may occur with  improper specimen collection/handling, submission of specimen other than nasopharyngeal swab, presence of viral mutation(s) within the areas targeted by this assay, and inadequate number of viral copies(<138 copies/mL). A negative result must be combined with clinical observations, patient history, and epidemiological information. The expected result is Negative.  Fact Sheet for Patients:  EntrepreneurPulse.com.au  Fact Sheet for Healthcare Providers:  IncredibleEmployment.be  This test is no t yet approved or cleared by the [place] FDA and  has been authorized for detection and/or diagnosis of SARS-CoV-2 by FDA under an Emergency Use Authorization (EUA). This EUA will remain  in effect (meaning this test can be used) for the duration of the COVID-19 declaration under Section 564(b)(1) of the Act, 21 U.S.C.section 360bbb-3(b)(1), unless the authorization is terminated  or revoked sooner.       Influenza A by PCR NEGATIVE NEGATIVE   Influenza B by PCR NEGATIVE NEGATIVE    Comment: (NOTE) The Xpert Xpress SARS-CoV-2/FLU/RSV plus assay is intended as an aid in the diagnosis of influenza from Nasopharyngeal swab specimens and should not be used as a sole basis for treatment. Nasal washings and aspirates are unacceptable for Xpert Xpress SARS-CoV-2/FLU/RSV testing.  Fact Sheet for Patients: EntrepreneurPulse.com.au  Fact Sheet for Healthcare Providers: IncredibleEmployment.be  This test is not yet approved or cleared by the [place] FDA and has been authorized for detection and/or diagnosis of SARS-CoV-2 by FDA under an Emergency Use Authorization (EUA).  This EUA will remain in effect (meaning this  test can be used) for the duration of the COVID-19 declaration under Section 564(b)(1) of the Act, 21 U.S.C. section 360bbb-3(b)(1), unless the authorization is terminated or revoked.     Resp Syncytial Virus by PCR NEGATIVE NEGATIVE    Comment: (NOTE) Fact Sheet for Patients: EntrepreneurPulse.com.au  Fact Sheet for Healthcare Providers: IncredibleEmployment.be  This test is not yet approved or cleared by the [place] FDA and has been authorized for detection and/or diagnosis of SARS-CoV-2 by FDA under an Emergency Use Authorization (EUA). This EUA will remain in effect (meaning this test can be used) for the duration of the COVID-19 declaration under Section 564(b)(1) of the Act, 21 U.S.C. section 360bbb-3(b)(1), unless the authorization is terminated or revoked.  Performed at [facility], [place] [address], [place], [place] [address]   Troponin I (High Sensitivity)     Status: Abnormal   Collection Time: 04/26/22  8:51 AM  Result Value Ref Range   Troponin I (High Sensitivity) 232 (HH) <18 ng/L    Comment: CRITICAL RESULT CALLED TO, READ BACK BY AND VERIFIED WITH A,Magie Ciampa RN '@[id number]'$  04/26/22 E,[place] (NOTE) Elevated high sensitivity troponin I (hsTnI) values and significant  changes across serial measurements may suggest ACS but many other  chronic and acute conditions are known to elevate hsTnI results.  Refer to the "Links" section for chest pain algorithms and additional  guidance. Performed at [facility], [place] [address] [place], [place] [address]   Urinalysis, Routine w reflex microscopic     Status: None   Collection Time: 04/26/22 10:33 AM  Result Value Ref Range   Color, Urine YELLOW YELLOW   APPearance CLEAR CLEAR   Specific Gravity, Urine 1.020 1.005 - 1.030   pH 5.0 5.0 - 8.0   Glucose, UA NEGATIVE NEGATIVE mg/dL   Hgb urine dipstick NEGATIVE NEGATIVE   Bilirubin  Urine NEGATIVE NEGATIVE   Ketones, ur NEGATIVE NEGATIVE mg/dL   Protein, ur NEGATIVE NEGATIVE mg/dL   Nitrite NEGATIVE NEGATIVE   Leukocytes,Ua NEGATIVE NEGATIVE    Comment: Performed at [name] [address], [place], [place] [address]  Troponin I (High Sensitivity)     Status: Abnormal   Collection Time: 04/26/22 12:42 PM  Result Value Ref Range   Troponin I (High Sensitivity) 909 (HH) <18 ng/L    Comment: CRITICAL VALUE NOTED. VALUE IS CONSISTENT WITH PREVIOUSLY REPORTED/CALLED VALUE (NOTE) Elevated high sensitivity troponin I (hsTnI) values and significant  changes across serial measurements may suggest ACS but many other  chronic and acute conditions are known to elevate hsTnI results.  Refer to the "Links" section for chest pain algorithms and additional  guidance. Performed at [facility], [place] [address], [place], [place] [address]    CT Head Wo Contrast  Result Date: 04/26/2022 CLINICAL DATA:  Acute headache. EXAM: CT HEAD WITHOUT CONTRAST TECHNIQUE: Contiguous axial images were obtained from the base of the skull through the vertex without intravenous contrast. RADIATION DOSE REDUCTION: This exam was performed according to the departmental dose-optimization program which includes automated exposure control, adjustment of the mA and/or kV according to patient size and/or use of iterative reconstruction technique. COMPARISON:  12/19/2021 FINDINGS: Brain: No evidence of intracranial hemorrhage, acute infarction, hydrocephalus, extra-axial collection, or mass lesion/mass effect. Stable moderate cerebral and cerebellar atrophy. Right cerebellar encephalomalacia is stable, likely due to old infarct. Multiple old lacunar infarcts are again seen involving the left cerebellum, left basal ganglia and left thalamus. Mild chronic small vessel  disease is also stable. Vascular:  No hyperdense vessel or other acute findings. Skull: No evidence of fracture or other significant  bone abnormality. Sinuses/Orbits:  No acute findings. Other: None. IMPRESSION: No acute intracranial abnormality. Stable cerebral and cerebellar atrophy, old infarcts, and chronic small vessel disease as described above. Electronically Signed   By: Marlaine Hind M.D.   On: 04/26/2022 12:34   DG Chest Portable 1 View  Result Date: 04/26/2022 CLINICAL DATA:  76 year old male with history of chest pain and shortness of breath. EXAM: PORTABLE CHEST 1 VIEW COMPARISON:  Chest x-ray 12/19/2021. FINDINGS: Lung volumes are normal. No consolidative airspace disease. No pleural effusions. No pneumothorax. No pulmonary nodule or mass noted. Pulmonary vasculature and the cardiomediastinal silhouette are within normal limits. Atherosclerosis in the thoracic aorta. IMPRESSION: 1.  No radiographic evidence of acute cardiopulmonary disease. 2. Aortic atherosclerosis. Electronically Signed   By: Vinnie Langton M.D.   On: 04/26/2022 07:25    Pending Labs Unresulted Labs (From admission, onward)     Start     Ordered   04/27/22 6712  Basic metabolic panel  Tomorrow morning,   R        04/26/22 1353   04/27/22 0500  Heparin level (unfractionated)  Daily,   R     See Hyperspace for full Linked Orders Report.   04/26/22 1406   04/27/22 0500  CBC  Daily,   R     See Hyperspace for full Linked Orders Report.   04/26/22 1406   04/26/22 2200  Heparin level (unfractionated)  Once-Timed,   TIMED        04/26/22 1406            Vitals/Pain Today's Vitals   04/26/22 1330 04/26/22 1400 04/26/22 1415 04/26/22 1430  BP: 105/63  112/85 (!) 108/91  Pulse: 78  (!) 128 (!) 136  Resp: '18  17 20  '$ Temp:      TempSrc:      SpO2: 99%  98% 98%  Weight:  98 kg    Height:  '5\' 7"'$  (1.702 m)    PainSc:        Isolation Precautions Airborne and Contact precautions  Medications Medications  amiodarone (NEXTERONE PREMIX) 360-4.14 MG/200ML-% (1.8 mg/mL) IV infusion (0 mg/hr Intravenous Stopped 04/26/22 1343)  amiodarone  (NEXTERONE PREMIX) 360-4.14 MG/200ML-% (1.8 mg/mL) IV infusion (30 mg/hr Intravenous New Bag/Given 04/26/22 1343)  metoprolol tartrate (LOPRESSOR) tablet 12.5 mg (12.5 mg Oral Given 04/26/22 1131)  acetaminophen (TYLENOL) tablet 650 mg (has no administration in time range)  atorvastatin (LIPITOR) tablet 80 mg (has no administration in time range)  nitroGLYCERIN (NITROSTAT) SL tablet 0.4 mg (has no administration in time range)  calcium-vitamin D (OSCAL WITH D) 500-5 MG-MCG per tablet 1 tablet (has no administration in time range)  insulin aspart (novoLOG) injection 0-9 Units (has no administration in time range)  insulin aspart (novoLOG) injection 0-5 Units (has no administration in time range)  ondansetron (ZOFRAN) injection 4 mg (has no administration in time range)  heparin ADULT infusion 100 units/mL (25000 units/263m) (1,000 Units/hr Intravenous New Bag/Given 04/26/22 1441)  0.9 %  sodium chloride infusion (has no administration in time range)  amiodarone (NEXTERONE) IV bolus only 150 mg/100 mL (0 mg Intravenous Stopped 04/26/22 0903)  heparin bolus via infusion 4,000 Units (4,000 Units Intravenous Bolus from Bag 04/26/22 1442)    Mobility walks with device Low fall risk   Focused Assessments Cardiac Assessment Handoff:  Cardiac Rhythm: Ventricular tachycardia, Atrial fibrillation  Lab Results  Component Value Date   TROPONINI <0.03 11/16/2015   No results found for: "DDIMER" Does the Patient currently have chest pain? No    R Recommendations: See Admitting Provider Note  Report given to:   Additional Notes:

## 2022-04-26 NOTE — Progress Notes (Signed)
ANTICOAGULATION CONSULT NOTE  Pharmacy Consult for heparin  Indication: chest pain/ACS Brief A/P: Heparin level subtherapeutic Increase Heparin rate  Allergies  Allergen Reactions   Cheese Nausea And Vomiting   Pravastatin Anxiety    Anxiety attack    Patient Measurements: Height: '5\' 8"'$  (172.7 cm) Weight: 98.2 kg (216 lb 7.9 oz) IBW/kg (Calculated) : 68.4 Heparin Dosing Weight: 87.2kg   Vital Signs: Temp: 98.9 F (37.2 C) (12/16 2202) Temp Source: Oral (12/16 2202) BP: 101/63 (12/16 2202) Pulse Rate: 100 (12/16 2202)  Labs: Recent Labs    04/26/22 0730 04/26/22 0851 04/26/22 1242 04/26/22 1742 04/26/22 2238  HGB 11.9*  --   --  12.1* 11.8*  HCT 36.0*  --   --  35.6* 33.9*  PLT 90*  --   --   --   --   HEPARINUNFRC  --   --   --   --  0.27*  CREATININE 1.20  --   --   --   --   TROPONINIHS 22* 232* 909*  --   --      Estimated Creatinine Clearance: 59.5 mL/min (by C-G formula based on SCr of 1.2 mg/dL).  Assessment: 76 y.o. male with chest pain and Afib for heparin   Goal of Therapy:  Heparin level 0.3-0.7 units/ml Monitor platelets by anticoagulation protocol: Yes   Plan:  Increase Heparin 1200 units/hr Follow-up am labs.    Phillis Knack, PharmD, BCPS  04/26/2022,11:39 PM

## 2022-04-26 NOTE — H&P (Addendum)
History and Physical    DOA: 04/26/2022  PCP: [name], MD  Patient coming from: Home  Chief Complaint: Chest pain  HPI: Jose Beck is a 76 y.o. 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  Patient states he woke up around 4:40 AM with chest discomfort-burning sensation, 5/10, nonradiating, not associated with nausea or palpitations or dizziness.  He took sublingual nitro x 1 (apparently expired med since 2017) with partial relief bringing the pain down to 3/10.  He states he still had some chest discomfort on arrival to the ED at 2/10 and was noted to be in new onset A-fib with RVR.  Patient initially given Cardizem IV, subsequently started on IV amnio drip and concern for low BP.  First set of troponin elevated at 20 and second set further up to 200s prompting cardiology evaluation.  Cardiology recommended admission to medical service and repeat CT head prior to IV heparin consideration.  PCCM evaluated patient given high risk and hemodynamic instability.  At the time of their evaluation patient's heart rate improved to low 100 send SBP stable in the 90s.  Hence they requested admission to progressive unit. On my interview, patient noted to be awake alert oriented x 3.  Wife at bedside and corroborates his history but patient communicative and able to participate in decision-making process.  He denies any chest  pain or dyspnea or palpitations or dizziness currently.  He denies any worsening of residual right-sided weakness.  Denies any new numbness or tingling.  He is aware of ongoing cardiac concerns and plan to admit to the hospital.  Review of Systems: As per HPI, otherwise review of systems negative.    Past Medical History:  Diagnosis Date   Abdominal aortic aneurysm (AAA) ([place])    Aortic stenosis    a. 11/2015 Echo: EF 55-60%, Gr1 DD, mild AS.   Chronic combined systolic and diastolic CHF (congestive heart failure) ([place])    a. 07/2015: TEE showing EF of 40% b. 11/2015: Echo w/ EF 55-60%, Grade 1 DD, mild AS.   Coronary artery disease    a. patient reports 100% stenosis of LAD and RCA by cath in 1999. b. 11/2015 Cath: LM nl, LAD 100 - fills by L->L collats from D1, LCX 60d, OM1 lalrge/nl, OM2 small/nl, RCA 157m L->R collats.EF 35-45% (55-60% by echo).   Diabetes mellitus without complication ([place]    Type II   GERD (gastroesophageal reflux disease)    Hyperlipemia    Hypertension    Hypertensive heart disease    Stroke ([facility]    a. 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   Thrombocytopenia ([facility] 11/2015    Past Surgical History:  Procedure Laterality Date   ABDOMINAL AORTIC ENDOVASCULAR STENT GRAFT N/A 11/25/2017   Procedure: ABDOMINAL AORTIC ENDOVASCULAR STENT GRAFT;  Surgeon: BSerafina Mitchell MD;  Location: [facility]  Service: Vascular;  Laterality: N/A;   CARDIAC CATHETERIZATION     CARDIAC CATHETERIZATION N/A 11/16/2015   Procedure: Left Heart Cath and Coronary Angiography;  Surgeon: HMallie Mussel  Nicholes Stairs, MD;  Location: [facility];  Service: Cardiovascular;  Laterality: N/A;   MOLE REMOVAL     TONSILLECTOMY      Social history:  reports that he has never smoked. He has never used smokeless tobacco. He reports current alcohol use of about 1.0 standard drink of alcohol per week. He reports that he does not use drugs.   Allergies  Allergen Reactions   Cheese Nausea And Vomiting    Pravastatin Anxiety    Anxiety attack    Family History  Problem Relation Age of Onset   Hypertension Mother    Heart disease Mother       Prior to Admission medications   Medication Sig Start Date End Date Taking? Authorizing Provider  acetaminophen (TYLENOL) 650 MG CR tablet Take 650 mg by mouth every 8 (eight) hours as needed for pain.    [provider]  atorvastatin (LIPITOR) 80 MG tablet Take 1 tablet (80 mg total) by mouth daily at 6 PM. 04/25/16   Martinique, Peter M, MD  calcium-vitamin D 250-100 MG-UNIT tablet Take 1 tablet by mouth daily. Takes in morning '600mg'$     [provider]  carvedilol (COREG) 25 MG tablet Take 25 mg by mouth 2 (two) times daily with a meal.    [provider]  COVID-19 mRNA vaccine [phone] (COMIRNATY) syringe Inject into the muscle. 03/17/22   [name], MD  insulin detemir (LEVEMIR) 100 UNIT/ML injection Inject 15 Units into the skin daily.    [provider]  losartan (COZAAR) 50 MG tablet Take 50 mg by mouth daily. 12/30/21   [provider]  nitroGLYCERIN (NITROSTAT) 0.4 MG SL tablet Place 0.4 mg under the tongue every 5 (five) minutes as needed for chest pain (x 3 doses).    [provider]  tacrolimus (PROTOPIC) 0.1 % ointment Apply 1 application topically daily as needed (facial scaling).    [provider]    Physical Exam: Vitals:   04/26/22 1245 04/26/22 1315 04/26/22 1330 04/26/22 1400  BP: 90/72 109/79 105/63   Pulse: 90 (!) 112 78   Resp: (!) '25 20 18   '$ Temp:      TempSrc:      SpO2: 98% 100% 99%   Weight:    98 kg  Height:    '5\' 7"'$  (1.702 m)    Constitutional: NAD, calm, comfortable-communicative and able to talk full sentences. Eyes: PERRL, lids and conjunctivae normal ENMT: Mucous membranes are moist. Posterior pharynx clear of any exudate or lesions.Normal dentition.  Neck: normal, supple, no masses, no thyromegaly Respiratory: clear to auscultation  bilaterally, no wheezing, no crackles. Normal respiratory effort. No accessory muscle use.  Cardiovascular: Irregular rate and rhythm, mild systolic murmur at the apex, no rubs / gallops. No extremity edema. 2+ pedal pulses. No carotid bruits.  Abdomen: no tenderness, no masses palpated. No hepatosplenomegaly. Bowel sounds positive.  Musculoskeletal: no clubbing / cyanosis. No joint deformity upper and lower extremities. Good ROM, no contractures. Normal muscle tone.  Neurologic: CN 2-12 grossly intact. Sensation intact, residual right-sided weakness 4/5 compared to left side and residual dysarthria/ataxia from old stroke Psychiatric: Normal judgment and insight. Alert and oriented x 3. Normal mood.  SKIN/catheters: no rashes, lesions, ulcers. No induration  Labs on Admission: I have personally reviewed following labs and imaging studies  CBC: Recent Labs  Lab 04/26/22 0730  WBC 5.4  NEUTROABS 4.3  HGB 11.9*  HCT 36.0*  MCV 98.4  PLT 90*  Basic Metabolic Panel: Recent Labs  Lab 04/26/22 0730  NA 138  K 4.3  CL 108  CO2 22  GLUCOSE 130*  BUN 25*  CREATININE 1.20  CALCIUM 9.0  MG 1.9   GFR: Estimated Creatinine Clearance: 58.4 mL/min (by C-G formula based on SCr of 1.2 mg/dL). Recent Labs  Lab 04/26/22 0730  WBC 5.4   Liver Function Tests: Recent Labs  Lab 04/26/22 0730  AST 21  ALT 15  ALKPHOS 40  BILITOT 0.7  PROT 6.5  ALBUMIN 3.5   No results for input(s): "LIPASE", "AMYLASE" in the last 168 hours. No results for input(s): "AMMONIA" in the last 168 hours. Coagulation Profile: No results for input(s): "INR", "PROTIME" in the last 168 hours. Cardiac Enzymes: No results for input(s): "CKTOTAL", "CKMB", "CKMBINDEX", "TROPONINI" in the last 168 hours. BNP (last 3 results) No results for input(s): "PROBNP" in the last 8760 hours. HbA1C: No results for input(s): "HGBA1C" in the last 72 hours. CBG: No results for input(s): "GLUCAP" in the last 168  hours. Lipid Profile: No results for input(s): "CHOL", "HDL", "LDLCALC", "TRIG", "CHOLHDL", "LDLDIRECT" in the last 72 hours. Thyroid Function Tests: No results for input(s): "TSH", "T4TOTAL", "FREET4", "T3FREE", "THYROIDAB" in the last 72 hours. Anemia Panel: No results for input(s): "VITAMINB12", "FOLATE", "FERRITIN", "TIBC", "IRON", "RETICCTPCT" in the last 72 hours. Urine analysis:    Component Value Date/Time   COLORURINE YELLOW 04/26/2022 [place] 04/26/2022 1033   LABSPEC 1.020 04/26/2022 1033   PHURINE 5.0 04/26/2022 1033   GLUCOSEU NEGATIVE 04/26/2022 1033   HGBUR NEGATIVE 04/26/2022 1033   BILIRUBINUR NEGATIVE 04/26/2022 [place] 04/26/2022 1033   PROTEINUR NEGATIVE 04/26/2022 1033   NITRITE NEGATIVE 04/26/2022 1033   LEUKOCYTESUR NEGATIVE 04/26/2022 1033    Radiological Exams on Admission: Personally reviewed  CT Head Wo Contrast  Result Date: 04/26/2022 CLINICAL DATA:  Acute headache. EXAM: CT HEAD WITHOUT CONTRAST TECHNIQUE: Contiguous axial images were obtained from the base of the skull through the vertex without intravenous contrast. RADIATION DOSE REDUCTION: This exam was performed according to the departmental dose-optimization program which includes automated exposure control, adjustment of the mA and/or kV according to patient size and/or use of iterative reconstruction technique. COMPARISON:  12/19/2021 FINDINGS: Brain: No evidence of intracranial hemorrhage, acute infarction, hydrocephalus, extra-axial collection, or mass lesion/mass effect. Stable moderate cerebral and cerebellar atrophy. Right cerebellar encephalomalacia is stable, likely due to old infarct. Multiple old lacunar infarcts are again seen involving the left cerebellum, left basal ganglia and left thalamus. Mild chronic small vessel disease is also stable. Vascular:  No hyperdense vessel or other acute findings. Skull: No evidence of fracture or other significant bone  abnormality. Sinuses/Orbits:  No acute findings. Other: None. IMPRESSION: No acute intracranial abnormality. Stable cerebral and cerebellar atrophy, old infarcts, and chronic small vessel disease as described above. Electronically Signed   By: Marlaine Hind M.D.   On: 04/26/2022 12:34   DG Chest Portable 1 View  Result Date: 04/26/2022 CLINICAL DATA:  76 year old male with history of chest pain and shortness of breath. EXAM: PORTABLE CHEST 1 VIEW COMPARISON:  Chest x-ray 12/19/2021. FINDINGS: Lung volumes are normal. No consolidative airspace disease. No pleural effusions. No pneumothorax. No pulmonary nodule or mass noted. Pulmonary vasculature and the cardiomediastinal silhouette are within normal limits. Atherosclerosis in the thoracic aorta. IMPRESSION: 1.  No radiographic evidence of acute cardiopulmonary disease. 2. Aortic atherosclerosis. Electronically Signed   By: Vinnie Langton M.D.   On:  04/26/2022 07:25    EKG: Independently reviewed. :  Atrial fibrillation with heart rate 147 bpm, PVCs.     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  Atrial fibrillation with RVR: Currently on IV amiodarone drip and heart rate down from 190s to low 100s.  SBP 98-100.   Patient mentating well.  Noted orders for IV heparin by EDP based on third set of troponin.  Patient aware of risks and benefits involving anticoagulation.  Will monitor closely heart rate, blood pressure and any clinical signs of bleeding/hemoglobin.  Cardiology also added p.o. metoprolol with BP parameters. His CHADS2-VASC score is at 7, HAS-BLED at 4.  He remains high risk for stroke and bleeding.  2.  Chest pain with Non-STEMI --h/o CAD: Initially suspected to be demand ischemia.  Cardiology wanted to wait for third set of troponin before deciding on need for cardiac intervention/cath.  They recommended IV heparin if repeat CT head non concerning which resulted as below.  Third set of troponin now at 900 and ED physician after discussing with cardiology has initiated patient on ACS heparin protocol.  Patient states he may not want to undergo any procedures and wishes to speak to cardiology regarding further plan.  He is on Plavix/statins/Coreg and nitro as needed at home.  Will keep patient n.p.o. until cardiology advises of further plan for interventions.  He did undergo cardiac cath in the past.  3.  Chronic combined CHF: He appears compensated currently.  Not on any diuretics at home.  Last echo in August with EF 60%.  Given troponin elevation, plan to repeat echo per cardiology. KVO fluids until cleared for diet.  4.  Abdominal aortic aneurysm: Followed closely by vascular surgery as outpatient.  He did have endovascular repair previously but on last visit in September 2023 there was concern for enlarging AAA (from postoperative 5.4 to 6.2 cm and endovascular leak).  Options of IR embolization versus serial CT scans discussed with patient and patient opted for biannual follow-up CT scans  5.  Recurrent falls, recent admission for ICH: Patient noted to have chronic SDH as well as subarachnoid hemorrhage and previous admission.  He denies any new focal deficits since previous stroke.  Repeat CT head  today unremarkable.  6.  History of CVA: Patient has baseline residual dysarthria, mild right-sided weakness (right upper extremity with further limited range of motion due to shoulder dislocation from a previous fall) causing ataxia and predisposing him to problem #5.  Patient states he has not been on aspirin for at least 3 years but takes Plavix daily.  7. Diabetes mellitus, insulin-dependent: Per wife patient's last hemoglobin A1c was 5.2 and Lantus insulin dose reduced from 10 units to 20 units.  She also reports that patient eats liberally including ice  creams at home.  Will monitor on sliding scale insulin for now.  8.  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  He takes acetaminophen as needed for back pain.  9. Hypertension/hyperlipidemia: Hold home medication including losartan.  Hold Coreg is now on metoprolol.  Resume statins.  DVT prophylaxis: Now on heparin drip  COVID screen: Negative  Code Status: Discussed at length with patient and his wife regarding tenuous medical condition and underlying major risk factors making treatment options precarious.  Both patient and wife acknowledged overall deteriorating health condition but wife hopeful that he can maintain current quality of life where he is able to still ambulate, communicate appropriately and go out with family for lunches etc.patient clearly states that he would not want any aggressive surgical interventions/procedures but is open to IV medications/fluids/blood transfusions as needed.  He would not want to be resuscitated (discussed about CPR/shocking and ventilation options) or put on artificial breathing machines for prolonging life in case of medical emergency.  He has requested DNR status and wife aware of his wishes.   Although she is hopeful for ability to maintain quality of life, she understands his overall poor prognosis and agreeable to honoring his wishes for DNR status.  He does have a living will and health care proxy would be his wife.  Both patient and wife open to palliative care discussions.  Patient also mentioned that he may not want to undergo cardiac cath but I have advised him to discuss with cardiology further regarding this procedure.  Patient/Family Communication: Discussed with patient and all questions answered to satisfaction.  Consults called: Cardiology, PCCM Admission status :I certify that at the point of admission it is my clinical judgment that the patient will require inpatient hospital care spanning beyond 2 midnights from the point of admission due to high intensity of service and high frequency of surveillance required.Inpatient status is judged to be reasonable and necessary in order to provide the required intensity of service to ensure the patient's safety. The patient's presenting symptoms, physical exam findings, and initial radiographic and laboratory data in the context of their chronic comorbidities is felt to place them at high risk for further clinical deterioration. The following factors support the patient status of inpatient : The amiodarone, IV heparin and close monitoring in progressive unit.     Guilford Shi MD [facility] Pager in [place]  If 7PM-7AM, please contact night-coverage [email or web address]   04/26/2022, 2:36 PM

## 2022-04-26 NOTE — Progress Notes (Signed)
  Echocardiogram 2D Echocardiogram has been performed.  Jose Beck 04/26/2022, 3:24 PM

## 2022-04-26 NOTE — ED Provider Notes (Signed)
[facility] EMERGENCY DEPARTMENT Provider Note   CSN: [id number] Arrival date & time: 04/26/22  7829     History {Add pertinent medical, surgical, social history, OB history to HPI:1} Chief Complaint  Patient presents with   Chest Pain    Jose Beck is a 76 y.o. male.  HPI     76yo male with history of AS, combined systolic and diastolic CHF, CAD, htn, hlpd, DM, CVAight sided weakness and aphasia, traumatic SAH and SDH in August, who presents with concern for chest pain.   Yesterday was in a normal state of health.  Woke up today with pressure like chest pain to central chest at 440AM.  Took an expired nitro around 5AM.  Past Medical History:  Diagnosis Date   Abdominal aortic aneurysm (AAA) ([place])    Aortic stenosis    a. 11/2015 Echo: EF 55-60%, Gr1 DD, mild AS.   Chronic combined systolic and diastolic CHF (congestive heart failure) ([place])    a. 07/2015: TEE showing EF of 40% b. 11/2015: Echo w/ EF 55-60%, Grade 1 DD, mild AS.   Coronary artery disease    a. patient reports 100% stenosis of LAD and RCA by cath in 1999. b. 11/2015 Cath: LM nl, LAD 100 - fills by L->L collats from D1, LCX 60d, OM1 lalrge/nl, OM2 small/nl, RCA 187m L->R collats.EF 35-45% (55-60% by echo).   Diabetes [place] without complication ([place]    Type II   GERD (gastroesophageal reflux disease)    Hyperlipemia    Hypertension    Hypertensive heart disease    [place]    a. 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   [place]rombocytopenia ([facility] 11/2015    Home Medications Prior to Admission medications   Medication Sig Start Date End Date Taking? Authorizing Provider  acetaminophen (TYLENOL) 650 MG CR tablet Take 650 mg by mouth every 8 (eight) hours as needed for pain.    [provider]  atorvastatin (LIPITOR) 80 MG tablet Take 1 tablet (80 mg total) by mouth daily at 6 PM. 04/25/16   JMartinique Peter M, MD  calcium-vitamin D 250-100 MG-UNIT tablet Take 1 tablet  by mouth daily. Takes in morning '600mg'$     [provider]  carvedilol (COREG) 25 MG tablet Take 25 mg by mouth 2 (two) times daily with a meal.    [provider]  COVID-19 mRNA vaccine [phone](COMIRNATY) syringe Inject into the muscle. 03/17/22   SCarlyle Basques MD  insulin detemir (LEVEMIR) 100 UNIT/ML injection Inject 15 Units into the skin daily.    [provider]  losartan (COZAAR) 50 MG tablet Take 50 mg by mouth daily. 12/30/21   [provider]  nitroGLYCERIN (NITROSTAT) 0.4 MG SL tablet Place 0.4 mg under the tongue every 5 (five) minutes as needed for chest pain (x 3 doses).    [provider]  tacrolimus (PROTOPIC) 0.1 % ointment Apply 1 application topically daily as needed (facial scaling).    [provider]      Allergies    Cheese and Pravastatin    Review of Systems   Review of Systems  Physical Exam Updated Vital Signs BP 94/67   Pulse (!) 141   Temp (!) 97.4 F (36.3 C) (Oral)   Resp 12   SpO2 95%  Physical Exam  ED Results / Procedures / Treatments   Labs (all labs ordered are listed, but only abnormal results are displayed) Labs Reviewed  BASIC METABOLIC PANEL  MAGNESIUM  CBC  TROPONIN I (HIGH SENSITIVITY)    EKG EKG Interpretation  Date/Time:  Saturday April 26 2022 06:57:10 EST Ventricular Rate:  147 PR Interval:  124 QRS Duration: 90 QT Interval:  261 QTC Calculation: 409 R Axis:   71 Text Interpretation: Atrial fibrillation Anterior infarct, old Repolarization abnormality, prob rate related Confirmed by Gareth Morgan [id number]) on 04/26/2022 7:02:11 AM  Radiology No results found.  Procedures Procedures  {Document cardiac monitor, telemetry assessment procedure when appropriate:1}  Medications Ordered in ED Medications - No data to display  ED Course/ Medical Decision Making/ A&P                           Medical Decision Making Amount and/or Complexity of Data  Reviewed Labs: ordered.  Risk Prescription drug management.   ***  {Document critical care time when appropriate:1} {Document review of labs and clinical decision tools ie heart score, Chads2Vasc2 etc:1}  {Document your independent review of radiology images, and any outside records:1} {Document your discussion with family members, caretakers, and with consultants:1} {Document social determinants of health affecting pt's care:1} {Document your decision making why or why not admission, treatments were needed:1} Final Clinical Impression(s) / ED Diagnoses Final diagnoses:  None    Rx / DC Orders ED Discharge Orders     None

## 2022-04-26 NOTE — Plan of Care (Signed)
   Discussed possibility of cardiac catheterization this admission with patient, but he declined. OK to continue heparin.   Margie Billet, PA-C 04/26/2022 5:44 PM

## 2022-04-26 NOTE — Plan of Care (Signed)

## 2022-04-26 NOTE — ED Triage Notes (Signed)
Pt bib [facility] woke up this morning with crushing chest pain 5/10. Pt had nitro at home but it was expired, took one but was ineffective. Ems gave 324 of asa at residence. Ems EKG showed afib rvr with hr of 150-190. 20g placed in left forearm with ems, 10 of Cardizem administered with ems., as well as 500 fluid bolus.

## 2022-04-26 NOTE — Consult Note (Addendum)
NAME:  Jose Beck, MRN:  [id number], DOB:  05-Oct-1945, LOS: 0 ADMISSION DATE:  04/26/2022, CONSULTATION DATE: 04/26/2022 REFERRING MD: Dr. Billy Fischer, CHIEF COMPLAINT: A-fib RVR  History of Present Illness:   This is a 76 year old gentleman, history of coronary disease, Known occlusion of the LAD and RCA by cath 1992 with repeat cath in 2017 showing similar findings, chronic diastolic, congestive heart failure, hypertension, hyperlipidemia, type 2 diabetes, gastroesophageal reflux, history of CVA, AAA.  Prior echocardiogram with normal ejection fraction 55 to 60%, catheter total occlusion of the RCA with proximal mid LAD.  Patient presented to the emergency department today with chest pain shortness of breath.  Found to have A-fib RVR with heart rates in the 150s 1-1 60s.  Patient had a white blood cell count of 5.4 hemoglobin 11.9 platelets of 90, sodium 138, creatinine 1.2.  Troponin mildly elevated from 22-232.  Pertinent  Medical History   Past Medical History:  Diagnosis Date   Abdominal aortic aneurysm (AAA) ([place])    Aortic stenosis    a. 11/2015 Echo: EF 55-60%, Gr1 DD, mild AS.   Chronic combined systolic and diastolic CHF (congestive heart failure) ([place])    a. 07/2015: TEE showing EF of 40% b. 11/2015: Echo w/ EF 55-60%, Grade 1 DD, mild AS.   Coronary artery disease    a. patient reports 100% stenosis of LAD and RCA by cath in 1999. b. 11/2015 Cath: LM nl, LAD 100 - fills by L->L collats from D1, LCX 60d, OM1 lalrge/nl, OM2 small/nl, RCA 152m L->R collats.EF 35-45% (55-60% by echo).   Diabetes mellitus without complication ([place]    Type II   GERD (gastroesophageal reflux disease)    Hyperlipemia    Hypertension    Hypertensive heart disease    Stroke ([place]    a. 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   [place]ocyt[place] 11/2015     Significant Hospital Events: Including procedures, antibiotic start and stop dates in addition to other pertinent events    12/16 - Ed visit, CP, afib RVR   Interim History / Subjective:  Per HPI above  Objective   Blood pressure 105/63, pulse 78, temperature 98 F (36.7 C), temperature source Oral, resp. rate 18, height '5\' 7"'$  (1.702 m), weight 98 kg, SpO2 99 %.       No intake or output data in the 24 hours ending 04/26/22 1412 Filed Weights   04/26/22 1400  Weight: 98 kg    Examination: General: Chronically ill-appearing elderly gentleman HENT: NCAT, tracking appropriately Lungs: Clear to auscultation bilaterally Cardiovascular: Tachycardic irregularly irregular, S1-S2 Abdomen: Soft nontender nondistended Extremities: No significant edema Neuro: Alert oriented following commands GU: Deferred  Resolved Hospital Problem list     Assessment & Plan:   Atrial fibrillation new onset with RVR PVCs and NSVT Elevated troponin Coronary artery disease Hypertension Dilation of the ascending order History of AAA followed by vascular surgery. H/o prostate cancer   Plan: Patient's blood pressure is stable. Continue amiodarone Can start low-dose beta-blocker. I agree with palliative care discussions as he is currently not taking any therapy for his metastatic prostate cancer. Continue heparin for systemic AC If patient's hemodynamics change please page critical care. Patient stable for progressive care admission with TRH.  Best Practice (right click and "Reselect all SmartList Selections" daily)   Diet/type: Regular consistency (see orders) DVT prophylaxis: systemic heparin GI prophylaxis: N/A Lines: N/A Foley:  N/A Code Status:  full code Last date of multidisciplinary goals of  care discussion [discussed with patient and wife at bedside]  Labs   CBC: Recent Labs  Lab 04/26/22 0730  WBC 5.4  NEUTROABS 4.3  HGB 11.9*  HCT 36.0*  MCV 98.4  PLT 90*    Basic Metabolic Panel: Recent Labs  Lab 04/26/22 0730  NA 138  K 4.3  CL 108  CO2 22  GLUCOSE 130*  BUN 25*  CREATININE  1.20  CALCIUM 9.0  MG 1.9   GFR: Estimated Creatinine Clearance: 58.4 mL/min (by C-G formula based on SCr of 1.2 mg/dL). Recent Labs  Lab 04/26/22 0730  WBC 5.4    Liver Function Tests: Recent Labs  Lab 04/26/22 0730  AST 21  ALT 15  ALKPHOS 40  BILITOT 0.7  PROT 6.5  ALBUMIN 3.5   No results for input(s): "LIPASE", "AMYLASE" in the last 168 hours. No results for input(s): "AMMONIA" in the last 168 hours.  ABG    Component Value Date/Time   TCO2 25 07/21/2017 1026     Coagulation Profile: No results for input(s): "INR", "PROTIME" in the last 168 hours.  Cardiac Enzymes: No results for input(s): "CKTOTAL", "CKMB", "CKMBINDEX", "TROPONINI" in the last 168 hours.  HbA1C: Hgb A1c MFr Bld  Date/Time Value Ref Range Status  12/19/2021 01:33 PM 5.4 4.8 - 5.6 % Final    Comment:    (NOTE) Pre diabetes:          5.7%-6.4%  Diabetes:              >6.4%  Glycemic control for   <7.0% adults with diabetes   11/18/2017 03:00 PM 5.7 (H) 4.8 - 5.6 % Final    Comment:    (NOTE) Pre diabetes:          5.7%-6.4% Diabetes:              >6.4% Glycemic control for   <7.0% adults with diabetes     CBG: No results for input(s): "GLUCAP" in the last 168 hours.  Review of Systems:   Review of Systems  Constitutional:  Negative for chills, fever, malaise/fatigue and weight loss.  HENT:  Negative for hearing loss, sore throat and tinnitus.   Eyes:  Negative for blurred vision and double vision.  Respiratory:  Positive for shortness of breath. Negative for cough, hemoptysis, sputum production, wheezing and stridor.   Cardiovascular:  Positive for chest pain. Negative for palpitations, orthopnea, leg swelling and PND.  Gastrointestinal:  Negative for abdominal pain, constipation, diarrhea, heartburn, nausea and vomiting.  Genitourinary:  Negative for dysuria, hematuria and urgency.  Musculoskeletal:  Negative for joint pain and myalgias.  Skin:  Negative for itching and  rash.  Neurological:  Negative for dizziness, tingling, weakness and headaches.  Endo/Heme/Allergies:  Negative for environmental allergies. Does not bruise/bleed easily.  Psychiatric/Behavioral:  Negative for depression. The patient is not nervous/anxious and does not have insomnia.   All other systems reviewed and are negative.    Past Medical History:  He,  has a past medical history of Abdominal aortic aneurysm (AAA) ([place]), Aortic stenosis, Chronic combined systolic and diastolic CHF (congestive heart failure) ([place]), Coronary artery disease, Diabetes mellitus without complication ([place]), GERD (gastroesophageal reflux disease), Hyperlipemia, Hypertension, Hypertensive heart disease, Stroke ([place]), and Thrombocytopenia ([place]) (11/2015).   Surgical History:   Past Surgical History:  Procedure Laterality Date   ABDOMINAL AORTIC ENDOVASCULAR STENT GRAFT N/A 11/25/2017   Procedure: ABDOMINAL AORTIC ENDOVASCULAR STENT GRAFT;  Surgeon: Serafina Mitchell, MD;  Location: [facility];  Service: Vascular;  Laterality: N/A;   CARDIAC CATHETERIZATION     CARDIAC CATHETERIZATION N/A 11/16/2015   Procedure: Left Heart Cath and Coronary Angiography;  Surgeon: [name], MD;  Location: [facility];  Service: Cardiovascular;  Laterality: N/A;   MOLE REMOVAL     TONSILLECTOMY       Social History:   reports that he has never smoked. He has never used smokeless tobacco. He reports current alcohol use of about 1.0 standard drink of alcohol per week. He reports that he does not use drugs.   Family History:  His family history includes Heart disease in his mother; Hypertension in his mother.   Allergies Allergies  Allergen Reactions   Cheese Nausea And Vomiting   Pravastatin Anxiety    Anxiety attack     Home Medications  Prior to Admission medications   Medication Sig Start Date End Date Taking? Authorizing Provider  acetaminophen (TYLENOL) 650 MG CR tablet Take 650 mg by mouth every 8 (eight)  hours as needed for pain.    [provider]  atorvastatin (LIPITOR) 80 MG tablet Take 1 tablet (80 mg total) by mouth daily at 6 PM. 04/25/16   Martinique, Peter M, MD  calcium-vitamin D 250-100 MG-UNIT tablet Take 1 tablet by mouth daily. Takes in morning '600mg'$     [provider]  carvedilol (COREG) 25 MG tablet Take 25 mg by mouth 2 (two) times daily with a meal.    [provider]  COVID-19 mRNA vaccine [id number] (COMIRNATY) syringe Inject into the muscle. 03/17/22   [name], MD  insulin detemir (LEVEMIR) 100 UNIT/ML injection Inject 15 Units into the skin daily.    [provider]  losartan (COZAAR) 50 MG tablet Take 50 mg by mouth daily. 12/30/21   [provider]  nitroGLYCERIN (NITROSTAT) 0.4 MG SL tablet Place 0.4 mg under the tongue every 5 (five) minutes as needed for chest pain (x 3 doses).    [provider]  tacrolimus (PROTOPIC) 0.1 % ointment Apply 1 application topically daily as needed (facial scaling).    [provider]      Garner Nash, DO [facility] 04/26/2022 2:12 PM

## 2022-04-27 DIAGNOSIS — I5021 Acute systolic (congestive) heart failure: Secondary | ICD-10-CM | POA: Diagnosis not present

## 2022-04-27 DIAGNOSIS — I2510 Atherosclerotic heart disease of native coronary artery without angina pectoris: Secondary | ICD-10-CM

## 2022-04-27 DIAGNOSIS — R0789 Other chest pain: Secondary | ICD-10-CM | POA: Diagnosis not present

## 2022-04-27 DIAGNOSIS — I214 Non-ST elevation (NSTEMI) myocardial infarction: Secondary | ICD-10-CM | POA: Diagnosis not present

## 2022-04-27 DIAGNOSIS — I714 Abdominal aortic aneurysm, without rupture, unspecified: Secondary | ICD-10-CM | POA: Diagnosis not present

## 2022-04-27 DIAGNOSIS — I4891 Unspecified atrial fibrillation: Secondary | ICD-10-CM | POA: Diagnosis not present

## 2022-04-27 LAB — GLUCOSE, CAPILLARY
Glucose-Capillary: 118 mg/dL — ABNORMAL HIGH (ref 70–99)
Glucose-Capillary: 119 mg/dL — ABNORMAL HIGH (ref 70–99)
Glucose-Capillary: 108 mg/dL — ABNORMAL HIGH (ref 70–99)
Glucose-Capillary: 122 mg/dL — ABNORMAL HIGH (ref 70–99)

## 2022-04-27 LAB — CBC
HCT: 35 % — ABNORMAL LOW (ref 39.0–52.0)
Hemoglobin: 11.7 g/dL — ABNORMAL LOW (ref 13.0–17.0)
MCH: 32.1 pg (ref 26.0–34.0)
MCHC: 33.4 g/dL (ref 30.0–36.0)
MCV: 96.2 fL (ref 80.0–100.0)
Platelets: 73 10*3/uL — ABNORMAL LOW (ref 150–400)
RBC: 3.64 MIL/uL — ABNORMAL LOW (ref 4.22–5.81)
RDW: 12.9 % (ref 11.5–15.5)
WBC: 4.3 10*3/uL (ref 4.0–10.5)
nRBC: 0 % (ref 0.0–0.2)

## 2022-04-27 LAB — BASIC METABOLIC PANEL
Anion gap: 13 (ref 5–15)
BUN: 18 mg/dL (ref 8–23)
CO2: 22 mmol/L (ref 22–32)
Calcium: 9.1 mg/dL (ref 8.9–10.3)
Chloride: 101 mmol/L (ref 98–111)
Creatinine, Ser: 1.05 mg/dL (ref 0.61–1.24)
GFR, Estimated: >60 mL/min (ref >60)
Glucose, Bld: 118 mg/dL — ABNORMAL HIGH (ref 70–99)
Potassium: 4 mmol/L (ref 3.5–5.1)
Sodium: 136 mmol/L (ref 135–145)

## 2022-04-27 LAB — HEMOGLOBIN AND HEMATOCRIT, BLOOD
HCT: 39.8 % (ref 39.0–52.0)
Hemoglobin: 13 g/dL (ref 13.0–17.0)

## 2022-04-27 LAB — TSH: TSH: 1.364 u[IU]/mL (ref 0.350–4.500)

## 2022-04-27 LAB — HEPARIN LEVEL (UNFRACTIONATED): Heparin Unfractionated: 0.35 IU/mL (ref 0.30–0.70)

## 2022-04-27 MED ORDER — ASPIRIN 81 MG PO TBEC
81.0000 mg | DELAYED_RELEASE_TABLET | Freq: Every day | ORAL | Status: DC
  Administered 2022-04-28: 81 mg via ORAL
  Filled 2022-04-27: qty 1

## 2022-04-27 MED ORDER — AMIODARONE HCL 200 MG PO TABS: 200.0000 mg | ORAL_TABLET | Freq: Every day | ORAL | Status: DC

## 2022-04-27 MED ORDER — AMIODARONE HCL 200 MG PO TABS
200.0000 mg | ORAL_TABLET | Freq: Two times a day (BID) | ORAL | Status: DC
  Administered 2022-04-27 – 2022-04-29 (×5): 200 mg via ORAL
  Filled 2022-04-27 (×5): qty 1

## 2022-04-27 MED ORDER — APIXABAN 5 MG PO TABS
5.0000 mg | ORAL_TABLET | Freq: Two times a day (BID) | ORAL | Status: DC
  Administered 2022-04-27 – 2022-04-29 (×4): 5 mg via ORAL
  Filled 2022-04-27 (×5): qty 1

## 2022-04-27 MED ORDER — METOPROLOL SUCCINATE ER 25 MG PO TB24
25.0000 mg | ORAL_TABLET | Freq: Every day | ORAL | Status: DC
  Administered 2022-04-28 – 2022-04-29 (×2): 25 mg via ORAL
  Filled 2022-04-27 (×2): qty 1

## 2022-04-27 NOTE — Progress Notes (Signed)
Rounding Note    Patient Name: Jose Beck Date of Encounter: 04/27/2022  [place] Cardiologist: Peter Martinique, MD   Subjective   Converted to sinus rhythm.  BP 136/85.  Creatinine 1.05, hemoglobin 11.7.  Denies any chest pain, reports dyspnea improved.  Inpatient Medications    Scheduled Meds:  atorvastatin  80 mg Oral q1800   calcium-vitamin D  1 tablet Oral Daily   clopidogrel  75 mg Oral Daily   insulin aspart  0-5 Units Subcutaneous QHS   insulin aspart  0-9 Units Subcutaneous TID WC   metoprolol tartrate  12.5 mg Oral BID   Continuous Infusions:  amiodarone 30 mg/hr (04/27/22 1215)   heparin 1,200 Units/hr (04/27/22 0958)   PRN Meds: acetaminophen, nitroGLYCERIN, ondansetron (ZOFRAN) IV   Vital Signs    Vitals:   04/27/22 0000 04/27/22 0200 04/27/22 0400 04/27/22 1012  BP: 102/84 102/61 106/70 136/85  Pulse: 79 80  69  Resp: '20 20 18 18  '$ Temp: 97.9 F (36.6 C)  99.1 F (37.3 C)   TempSrc: Oral  Oral   SpO2: 96% 98% 96% 99%  Weight:   97.5 kg   Height:        Intake/Output Summary (Last 24 hours) at 04/27/2022 1342 Last data filed at 04/27/2022 0841 Gross per 24 hour  Intake 854.6 ml  Output 510 ml  Net 344.6 ml      04/27/2022    4:00 AM 04/26/2022    4:48 PM 04/26/2022    2:00 PM  Last 3 Weights  Weight (lbs) 214 lb 15.2 oz 216 lb 7.9 oz 216 lb 0.8 oz  Weight (kg) 97.5 kg 98.2 kg 98 kg      Telemetry    NSR in 60s, short episodes of NSVT - Personally Reviewed  ECG    No new ECG - Personally Reviewed  Physical Exam   GEN: No acute distress.   Neck: No JVD Cardiac: RRR, 2/6 systolic murmur Respiratory: Clear to auscultation bilaterally. GI: Soft, nontender, non-distended  MS: No edema; No deformity. Neuro:  Alert and oriented Psych: Normal affect   Labs    High Sensitivity Troponin:   Recent Labs  Lab 04/26/22 0730 04/26/22 0851 04/26/22 1242 04/26/22 2238  TROPONINIHS 22* 232* 909* 3,742*      Chemistry Recent Labs  Lab 04/26/22 0730 04/27/22 0601  NA 138 136  K 4.3 4.0  CL 108 101  CO2 22 22  GLUCOSE 130* 118*  BUN 25* 18  CREATININE 1.20 1.05  CALCIUM 9.0 9.1  MG 1.9  --   PROT 6.5  --   ALBUMIN 3.5  --   AST 21  --   ALT 15  --   ALKPHOS 40  --   BILITOT 0.7  --   GFRNONAA >60 >60  ANIONGAP 8 13    Lipids No results for input(s): "CHOL", "TRIG", "HDL", "LABVLDL", "LDLCALC", "CHOLHDL" in the last 168 hours.  Hematology Recent Labs  Lab 04/26/22 0730 04/26/22 1742 04/26/22 2238 04/27/22 0601 04/27/22 1031  WBC 5.4  --   --  4.3  --   RBC 3.66*  --   --  3.64*  --   HGB 11.9*   < > 11.8* 11.7* 13.0  HCT 36.0*   < > 33.9* 35.0* 39.8  MCV 98.4  --   --  96.2  --   MCH 32.5  --   --  32.1  --   MCHC 33.1  --   --  33.4  --   RDW 13.2  --   --  12.9  --   PLT 90*  --   --  73*  --    < > = values in this interval not displayed.   Thyroid  Recent Labs  Lab 04/27/22 0601  TSH 1.364    BNPNo results for input(s): "BNP", "PROBNP" in the last 168 hours.  DDimer No results for input(s): "DDIMER" in the last 168 hours.   Radiology    ECHOCARDIOGRAM COMPLETE  Result Date: 04/26/2022    ECHOCARDIOGRAM REPORT   Patient Name:   BALRAJ BRAYFIELD Date of Exam: 04/26/2022 Medical Rec #:  [id number]           Height:       67.0 in Accession #:    [id number]          Weight:       216.0 lb Date of Birth:  [birth year]          BSA:          2.090 m Patient Age:    76 years            BP:           108/91 mmHg Patient Gender: M                   HR:           126 bpm. Exam Location:  Inpatient Procedure: 2D Echo, Color Doppler and Cardiac Doppler Indications:    Atrial Fibrillation I48.91  History:        Patient has prior history of Echocardiogram examinations, most                 recent 12/20/2021. CHF, CAD, Stroke, Aortic Valve Disease,                 Arrythmias:Atrial Fibrillation, Signs/Symptoms:Chest Pain; Risk                 Factors:Diabetes, Hypertension,  Dyslipidemia and Non-Smoker.  Sonographer:    Greer Pickerel Referring Phys: [id number] Margie Billet  Sonographer Comments: Technically difficult study due to poor echo windows. Image acquisition challenging due to patient body habitus and Image acquisition challenging due to respiratory motion. IMPRESSIONS  1. Poor acoustic windows  2. The left ventricular systolic function appears to be moderate to severely reduced. Accurate estimation of LVEF limited by tachycardia. Left ventricular endocardial border not optimally defined to evaluate regional wall motion. Left ventricular diastolic function could not be evaluated due to atrial fibrillation. Recommend Limited Echo with contrast for assessment of LVEF and RWMA after tachycardia is resolved.  3. Right ventricular systolic function was not well visualized. The right ventricular size is not well visualized. There is mildly elevated pulmonary artery systolic pressure.  4. Left atrial size was severely dilated.  5. Right atrial size was severely dilated.  6. The mitral valve is abnormal. Mild mitral valve regurgitation. No evidence of mitral stenosis.  7. The aortic valve was not well visualized. There is severe calcifcation of the aortic valve. Non-diasgnostic aortic valve doppler.  8. The inferior vena cava is normal in size with greater than 50% respiratory variability, suggesting right atrial pressure of 3 mmHg. Comparison(s): Changes from prior study are noted. LVEF appears to be worse. FINDINGS  Left Ventricle: The left ventricle has appears to be severely reduced function. Left ventricular endocardial border not optimally defined to evaluate regional wall motion. The left  ventricular internal cavity size was normal in size. There is no left ventricular hypertrophy. Left ventricular diastolic function could not be evaluated due to atrial fibrillation. Left ventricular diastolic function could not be evaluated. Right Ventricle: The right ventricular size is not  well visualized. Right vetricular wall thickness was not well visualized. Right ventricular systolic function was not well visualized. There is mildly elevated pulmonary artery systolic pressure. The tricuspid regurgitant velocity is 2.72 m/s, and with an assumed right atrial pressure of 8 mmHg, the estimated right ventricular systolic pressure is 19.6 mmHg. Left Atrium: Left atrial size was severely dilated. Right Atrium: Right atrial size was severely dilated. Pericardium: There is no evidence of pericardial effusion. Mitral Valve: The mitral valve is abnormal. There is moderate calcification of the mitral valve leaflet(s). Moderately decreased mobility of the mitral valve leaflets. Mild mitral annular calcification. Mild mitral valve regurgitation. No evidence of mitral valve stenosis. Tricuspid Valve: The tricuspid valve is not well visualized. Tricuspid valve regurgitation is trivial. No evidence of tricuspid stenosis. Aortic Valve: The aortic valve was not well visualized. There is severe calcifcation of the aortic valve. Pulmonic Valve: The pulmonic valve was not well visualized. Pulmonic valve regurgitation is not visualized. No evidence of pulmonic stenosis. Aorta: The aortic root is normal in size and structure. Venous: The inferior vena cava is normal in size with greater than 50% respiratory variability, suggesting right atrial pressure of 3 mmHg. IAS/Shunts: The interatrial septum was not well visualized.  LEFT VENTRICLE PLAX 2D LVOT diam:     1.90 cm      Diastology LV SV:         71           LV e' medial:    9.36 cm/s LV SV Index:   34           LV E/e' medial:  10.9 LVOT Area:     2.84 cm     LV e' lateral:   14.10 cm/s                             LV E/e' lateral: 7.2  LV Volumes (MOD) LV vol d, MOD A2C: 76.5 ml LV vol d, MOD A4C: 136.0 ml LV vol s, MOD A2C: 66.3 ml LV vol s, MOD A4C: 109.0 ml LV SV MOD A2C:     10.2 ml LV SV MOD A4C:     136.0 ml LV SV MOD BP:      14.3 ml RIGHT VENTRICLE RV S  prime:     10.90 cm/s TAPSE (M-mode): 1.4 cm LEFT ATRIUM            Index        RIGHT ATRIUM           Index LA Vol (A4C): 117.0 ml 55.99 ml/m  RA Area:     27.00 cm                                     RA Volume:   95.40 ml  45.65 ml/m  AORTIC VALVE LVOT Vmax:   137.90 cm/s LVOT Vmean:  90.600 cm/s LVOT VTI:    0.250 m  AORTA Ao Root diam: 3.50 cm MITRAL VALVE                TRICUSPID VALVE MV Area (PHT): 3.30 cm  TR Peak grad:   29.6 mmHg MV Decel Time: 230 msec     TR Vmax:        272.00 cm/s MV E velocity: 102.00 cm/s                             SHUNTS                             Systemic VTI:  0.25 m                             Systemic Diam: 1.90 cm Vishnu Priya Mallipeddi Electronically signed by Lorelee Cover Mallipeddi Signature Date/Time: 04/26/2022/3:47:03 PM    Final    CT Head Wo Contrast  Result Date: 04/26/2022 CLINICAL DATA:  Acute headache. EXAM: CT HEAD WITHOUT CONTRAST TECHNIQUE: Contiguous axial images were obtained from the base of the skull through the vertex without intravenous contrast. RADIATION DOSE REDUCTION: This exam was performed according to the departmental dose-optimization program which includes automated exposure control, adjustment of the mA and/or kV according to patient size and/or use of iterative reconstruction technique. COMPARISON:  12/19/2021 FINDINGS: Brain: No evidence of intracranial hemorrhage, acute infarction, hydrocephalus, extra-axial collection, or mass lesion/mass effect. Stable moderate cerebral and cerebellar atrophy. Right cerebellar encephalomalacia is stable, likely due to old infarct. Multiple old lacunar infarcts are again seen involving the left cerebellum, left basal ganglia and left thalamus. Mild chronic small vessel disease is also stable. Vascular:  No hyperdense vessel or other acute findings. Skull: No evidence of fracture or other significant bone abnormality. Sinuses/Orbits:  No acute findings. Other: None. IMPRESSION: No acute  intracranial abnormality. Stable cerebral and cerebellar atrophy, old infarcts, and chronic small vessel disease as described above. Electronically Signed   By: Marlaine Hind M.D.   On: 04/26/2022 12:34   DG Chest Portable 1 View  Result Date: 04/26/2022 CLINICAL DATA:  76 year old male with history of chest pain and shortness of breath. EXAM: PORTABLE CHEST 1 VIEW COMPARISON:  Chest x-ray 12/19/2021. FINDINGS: Lung volumes are normal. No consolidative airspace disease. No pleural effusions. No pneumothorax. No pulmonary nodule or mass noted. Pulmonary vasculature and the cardiomediastinal silhouette are within normal limits. Atherosclerosis in the thoracic aorta. IMPRESSION: 1.  No radiographic evidence of acute cardiopulmonary disease. 2. Aortic atherosclerosis. Electronically Signed   By: Vinnie Langton M.D.   On: 04/26/2022 07:25    Cardiac Studies     Patient Profile     76 y.o. male ith a hx of CAD (known occlusion of LAD and RCA by cath in 1992 with repeat cath in 2017 showing similar findings with collateral flow noted), chronic diastolic CHF, HTN, HLD, type 2 DM, GERD, history of CVA, AAA, and history of falls who is being seen 04/26/2022 for the evaluation of chest pain, atrial fibrillation    Assessment & Plan    Atrial fibrillation: Presented with new onset A-fib with RVR.  Heart rate initially in 150s to 190s and soft BP, started on IV amiodarone.  Converted to sinus rhythm. -Will convert to p.o. amiodarone.  Recommend continuing amiodarone to try to maintain sinus rhythm has does not tolerate A-fib well.  Will load with 200 mg twice daily x 2 weeks then decrease to 200 mg daily -Continue metoprolol, will consolidate to 25 mg Toprol-XL daily -Tolerating heparin drip, will convert to  p.o. Eliquis.  Will stop his Plavix with starting Eliquis.  He does have a history of falls, will need to be cautious on Eliquis but patient and his wife feel comfortable starting Eliquis   Acute  systolic heart failure: Echo 12/16 was poor quality study but LV systolic function appeared to be moderate to severely reduced.  Difficult to evaluate LVEF given A-fib with RVR and poor quality study.  Now that back in sinus rhythm, will repeat limited echo with contrast.  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  Will add GDMT as BP tolerates  Elevated troponin/CAD: Troponin up to 3742.  Suspect this represents demand ischemia from A-fib with RVR in setting of underlying obstructive CAD.  Cath in 2017 showed occluded mid RCA, occluded mid LAD, moderate distal Lcx.  Medical management was recommended.   -Discussed repeating cath given marked troponin elevation but patient is adamant that does not want any procedures done -Stop plavix with starting Eliquis as above.  Will plan ASA+Eliquis moving forward -Continue statin   For questions or updates, please contact [place] Please consult [email or web address] for contact info under        Signed, Donato Heinz, MD  04/27/2022, 1:42 PM

## 2022-04-27 NOTE — Progress Notes (Signed)
[facility]                                                                              Jose Beck, is a 76 y.o. 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 Patient reported that he woke up around 4:40 AM on the day of admission with chest discomfort, burning sensation 5/10, nonradiating, not associated with nausea, palpitations or dizziness.  He took nitro x 1, with partial relief.   In ED, was noted to be in new onset atrial fibrillation with RVR, initially given Cardizem IV, due to sodium and low BP, subsequently started on IV amiodarone drip.  First set of troponin elevated at 20, however second set in 200s, prompted cardiology evaluation. Patient was also placed on IV heparin drip after CT head, evaluated by PCCM.  Patient was admitted for further workup.   Assessment & Plan    Principal Problem:   Atrial fibrillation with rapid ventricular rate (HCC) -Heart rate currently in 60s, converted to normal sinus rhythm, on amiodarone drip -Hemoglobin stable, on IV heparin drip. CHADS2-VASC score 7 -Continue metoprolol 12.5 mg twice daily -Follow further cardiology recommendations   Active problems Chest pain with NSTEMI with underlying history of CAD -Likely escalated due to demand ischemia with atrial fibrillation with RVR -Troponin trended up from 22-> 232-> 909-> 3742 -Started on IV heparin drip.  CT head obtained prior to heparin showed no acute intracranial abnormality.   Stable cerebral and cerebellar atrophy, old infarcts and chronic small vessel disease. -Further plan per cardiology regarding cardiac cath.  Currently no acute chest pain. -Follow 2D echo, continue Plavix, Lipitor, beta-blocker  Chronic combined systolic and diastolic CHF -Currently stable, compensated, follow 2D echo  Abdominal aortic aneurysm - Followed closely by vascular surgery as outpatient.  He did have endovascular repair previously but on last visit in September 2023 there was concern for enlarging AAA (from postoperative 5.4 to 6.2 cm and endovascular leak).   -Biannual follow-up CT scans per patient's wishes   Recurrent falls with recent admission for ICH -Noted to have chronic SDH, SAH from previous admission -No acute FND's, repeat CT head on admission unremarkable   History of CVA with baseline residual dysarthria, mild right-sided hemiparesis -Continue Plavix, has not been on aspirin  Diabetes mellitus type 2, IDDM -Last hemoglobin A1c 5.2, outpatient Lantus dose 10 units daily -Continue SSI  Metastatic prostate CA (2 lymph nodes and spine) -Not felt to be a candidate for hormone therapy due to history of stroke and currently therapy options on hold   Hypertension, hyperlipidemia -Continue metoprolol, statin resumed   Obesity Estimated body mass index is 32.68 kg/m as  calculated from the following:   Height as of this encounter: '5\' 8"'$  (1.727 m).   Weight as of this encounter: 97.5 kg.  Code Status: DNR DVT Prophylaxis:  SCDs Start: 04/26/22 1353   Level of Care: Level of care: Progressive Family Communication: Updated patient's wife at the bedside   Disposition Plan:      Remains inpatient appropriate:    Procedures:  None  Consultants:   Cardiology Antimicrobials:     Medications  atorvastatin  80 mg Oral q1800   calcium-vitamin D  1 tablet Oral Daily   clopidogrel  75 mg Oral Daily   insulin aspart  0-5 Units Subcutaneous QHS   insulin aspart   0-9 Units Subcutaneous TID WC   metoprolol tartrate  12.5 mg Oral BID      Subjective:   Jose Beck was seen and examined today.   Patient denies dizziness, abdominal pain, N/V/D/C.  Chronic right-sided weakness, at baseline. No acute events overnight.  Currently no chest pain.  Objective:   Vitals:   04/27/22 0000 04/27/22 0200 04/27/22 0400 04/27/22 1012  BP: 102/84 102/61 106/70 136/85  Pulse: 79 80  69  Resp: '20 20 18 18  '$ Temp: 97.9 F (36.6 C)  99.1 F (37.3 C)   TempSrc: Oral  Oral   SpO2: 96% 98% 96% 99%  Weight:   97.5 kg   Height:        Intake/Output Summary (Last 24 hours) at 04/27/2022 1112 Last data filed at 04/27/2022 0841 Gross per 24 hour  Intake 854.6 ml  Output 510 ml  Net 344.6 ml     Wt Readings from Last 3 Encounters:  04/27/22 97.5 kg  01/27/22 98.4 kg  12/21/21 98.4 kg     Exam General: Alert and oriented x 3, NAD Cardiovascular: S1 S2 auscultated,  RRR, 2/6 systolic murmur Respiratory: CTAB, no wheezing Gastrointestinal: Soft, nontender, nondistended, + bowel sounds Ext: no pedal edema bilaterally Neuro: residual right-sided weakness from prior stroke Psych: Normal affect     Data Reviewed:  I have personally reviewed following labs    CBC Lab Results  Component Value Date   WBC 4.3 04/27/2022   RBC 3.64 (L) 04/27/2022   HGB 13.0 04/27/2022   HCT 39.8 04/27/2022   MCV 96.2 04/27/2022   MCH 32.1 04/27/2022   PLT 73 (L) 04/27/2022   MCHC 33.4 04/27/2022   RDW 12.9 04/27/2022   LYMPHSABS 0.5 (L) 04/26/2022   MONOABS 0.5 04/26/2022   EOSABS 0.1 04/26/2022   BASOSABS 0.0 62/83/1517     Last metabolic panel Lab Results  Component Value Date   NA 136 04/27/2022   K 4.0 04/27/2022   CL 101 04/27/2022   CO2 22 04/27/2022   BUN 18 04/27/2022   CREATININE 1.05 04/27/2022   GLUCOSE 118 (H) 04/27/2022   GFRNONAA >60 04/27/2022   GFRAA >60 11/26/2017   CALCIUM 9.1 04/27/2022   PHOS 3.0 12/19/2021   PROT 6.5  04/26/2022   ALBUMIN 3.5 04/26/2022   BILITOT 0.7 04/26/2022   ALKPHOS 40 04/26/2022   AST 21 04/26/2022   ALT 15 04/26/2022   ANIONGAP 13 04/27/2022    CBG (last 3)  Recent Labs    04/26/22 1652 04/26/22 2102 04/27/22 0613  GLUCAP 105* 151* 118*      Coagulation Profile: No results for input(s): "INR", "PROTIME" in the last 168 hours.   Radiology Studies: I have personally reviewed the imaging studies  ECHOCARDIOGRAM COMPLETE  Result Date: 04/26/2022  ECHOCARDIOGRAM REPORT   Patient Name:   MAYNOR MWANGI Date of Exam: 04/26/2022 Medical Rec #:  [id number]           Height:       67.0 in Accession #:    [id number]          Weight:       216.0 lb Date of Birth:  February 25, 1946          BSA:          2.090 m Patient Age:    89 years            BP:           108/91 mmHg Patient Gender: M                   HR:           126 bpm. Exam Location:  Inpatient Procedure: 2D Echo, Color Doppler and Cardiac Doppler Indications:    Atrial Fibrillation I48.91  History:        Patient has prior history of Echocardiogram examinations, most                 recent 12/20/2021. CHF, CAD, Stroke, Aortic Valve Disease,                 Arrythmias:Atrial Fibrillation, Signs/Symptoms:Chest Pain; Risk                 Factors:Diabetes, Hypertension, Dyslipidemia and Non-Smoker.  Sonographer:    Greer Pickerel Referring Phys: [id number] Margie Billet  Sonographer Comments: Technically difficult study due to poor echo windows. Image acquisition challenging due to patient body habitus and Image acquisition challenging due to respiratory motion. IMPRESSIONS  1. Poor acoustic windows  2. The left ventricular systolic function appears to be moderate to severely reduced. Accurate estimation of LVEF limited by tachycardia. Left ventricular endocardial border not optimally defined to evaluate regional wall motion. Left ventricular diastolic function could not be evaluated due to atrial fibrillation. Recommend Limited Echo  with contrast for assessment of LVEF and RWMA after tachycardia is resolved.  3. Right ventricular systolic function was not well visualized. The right ventricular size is not well visualized. There is mildly elevated pulmonary artery systolic pressure.  4. Left atrial size was severely dilated.  5. Right atrial size was severely dilated.  6. The mitral valve is abnormal. Mild mitral valve regurgitation. No evidence of mitral stenosis.  7. The aortic valve was not well visualized. There is severe calcifcation of the aortic valve. Non-diasgnostic aortic valve doppler.  8. The inferior vena cava is normal in size with greater than 50% respiratory variability, suggesting right atrial pressure of 3 mmHg. Comparison(s): Changes from prior study are noted. LVEF appears to be worse. FINDINGS  Left Ventricle: The left ventricle has appears to be severely reduced function. Left ventricular endocardial border not optimally defined to evaluate regional wall motion. The left ventricular internal cavity size was normal in size. There is no left ventricular hypertrophy. Left ventricular diastolic function could not be evaluated due to atrial fibrillation. Left ventricular diastolic function could not be evaluated. Right Ventricle: The right ventricular size is not well visualized. Right vetricular wall thickness was not well visualized. Right ventricular systolic function was not well visualized. There is mildly elevated pulmonary artery systolic pressure. The tricuspid regurgitant velocity is 2.72 m/s, and with an assumed right atrial pressure of 8 mmHg, the estimated right ventricular systolic pressure is 02.7 mmHg. Left  Atrium: Left atrial size was severely dilated. Right Atrium: Right atrial size was severely dilated. Pericardium: There is no evidence of pericardial effusion. Mitral Valve: The mitral valve is abnormal. There is moderate calcification of the mitral valve leaflet(s). Moderately decreased mobility of the mitral  valve leaflets. Mild mitral annular calcification. Mild mitral valve regurgitation. No evidence of mitral valve stenosis. Tricuspid Valve: The tricuspid valve is not well visualized. Tricuspid valve regurgitation is trivial. No evidence of tricuspid stenosis. Aortic Valve: The aortic valve was not well visualized. There is severe calcifcation of the aortic valve. Pulmonic Valve: The pulmonic valve was not well visualized. Pulmonic valve regurgitation is not visualized. No evidence of pulmonic stenosis. Aorta: The aortic root is normal in size and structure. Venous: The inferior vena cava is normal in size with greater than 50% respiratory variability, suggesting right atrial pressure of 3 mmHg. IAS/Shunts: The interatrial septum was not well visualized.  LEFT VENTRICLE PLAX 2D LVOT diam:     1.90 cm      Diastology LV SV:         71           LV e' medial:    9.36 cm/s LV SV Index:   34           LV E/e' medial:  10.9 LVOT Area:     2.84 cm     LV e' lateral:   14.10 cm/s                             LV E/e' lateral: 7.2  LV Volumes (MOD) LV vol d, MOD A2C: 76.5 ml LV vol d, MOD A4C: 136.0 ml LV vol s, MOD A2C: 66.3 ml LV vol s, MOD A4C: 109.0 ml LV SV MOD A2C:     10.2 ml LV SV MOD A4C:     136.0 ml LV SV MOD BP:      14.3 ml RIGHT VENTRICLE RV S prime:     10.90 cm/s TAPSE (M-mode): 1.4 cm LEFT ATRIUM            Index        RIGHT ATRIUM           Index LA Vol (A4C): 117.0 ml 55.99 ml/m  RA Area:     27.00 cm                                     RA Volume:   95.40 ml  45.65 ml/m  AORTIC VALVE LVOT Vmax:   137.90 cm/s LVOT Vmean:  90.600 cm/s LVOT VTI:    0.250 m  AORTA Ao Root diam: 3.50 cm MITRAL VALVE                TRICUSPID VALVE MV Area (PHT): 3.30 cm     TR Peak grad:   29.6 mmHg MV Decel Time: 230 msec     TR Vmax:        272.00 cm/s MV E velocity: 102.00 cm/s                             SHUNTS                             Systemic VTI:  0.25  m                             Systemic Diam: 1.90 cm Vishnu  Priya Mallipeddi Electronically signed by Lorelee Cover Mallipeddi Signature Date/Time: 04/26/2022/3:47:03 PM    Final    CT Head Wo Contrast  Result Date: 04/26/2022 CLINICAL DATA:  Acute headache. EXAM: CT HEAD WITHOUT CONTRAST TECHNIQUE: Contiguous axial images were obtained from the base of the skull through the vertex without intravenous contrast. RADIATION DOSE REDUCTION: This exam was performed according to the departmental dose-optimization program which includes automated exposure control, adjustment of the mA and/or kV according to patient size and/or use of iterative reconstruction technique. COMPARISON:  12/19/2021 FINDINGS: Brain: No evidence of intracranial hemorrhage, acute infarction, hydrocephalus, extra-axial collection, or mass lesion/mass effect. Stable moderate cerebral and cerebellar atrophy. Right cerebellar encephalomalacia is stable, likely due to old infarct. Multiple old lacunar infarcts are again seen involving the left cerebellum, left basal ganglia and left thalamus. Mild chronic small vessel disease is also stable. Vascular:  No hyperdense vessel or other acute findings. Skull: No evidence of fracture or other significant bone abnormality. Sinuses/Orbits:  No acute findings. Other: None. IMPRESSION: No acute intracranial abnormality. Stable cerebral and cerebellar atrophy, old infarcts, and chronic small vessel disease as described above. Electronically Signed   By: Marlaine Hind M.D.   On: 04/26/2022 12:34   DG Chest Portable 1 View  Result Date: 04/26/2022 CLINICAL DATA:  76 year old male with history of chest pain and shortness of breath. EXAM: PORTABLE CHEST 1 VIEW COMPARISON:  Chest x-ray 12/19/2021. FINDINGS: Lung volumes are normal. No consolidative airspace disease. No pleural effusions. No pneumothorax. No pulmonary nodule or mass noted. Pulmonary vasculature and the cardiomediastinal silhouette are within normal limits. Atherosclerosis in the thoracic aorta.  IMPRESSION: 1.  No radiographic evidence of acute cardiopulmonary disease. 2. Aortic atherosclerosis. Electronically Signed   By: Vinnie Langton M.D.   On: 04/26/2022 07:25       Eleuterio Dollar M.D. [facility] 04/27/2022, 11:12 AM  Available via Epic secure chat 7am-7pm After 7 pm, please refer to night coverage provider listed on amion.

## 2022-04-27 NOTE — Progress Notes (Signed)
ANTICOAGULATION CONSULT NOTE  Pharmacy Consult for heparin  Indication: chest pain/ACS  Allergies  Allergen Reactions   Cheese Nausea And Vomiting   Pravastatin Anxiety    Anxiety attack    Patient Measurements: Height: '5\' 8"'$  (172.7 cm) Weight: 97.5 kg (214 lb 15.2 oz) IBW/kg (Calculated) : 68.4 Heparin Dosing Weight: 87.2kg   Vital Signs: Temp: 99.1 F (37.3 C) (12/17 0400) Temp Source: Oral (12/17 0400) BP: 136/85 (12/17 1012) Pulse Rate: 69 (12/17 1012)  Labs: Recent Labs    04/26/22 0730 04/26/22 0851 04/26/22 1242 04/26/22 1742 04/26/22 2238 04/27/22 0601  HGB 11.9*  --   --  12.1* 11.8* 11.7*  HCT 36.0*  --   --  35.6* 33.9* 35.0*  PLT 90*  --   --   --   --  73*  HEPARINUNFRC  --   --   --   --  0.27* 0.35  CREATININE 1.20  --   --   --   --  1.05  TROPONINIHS 22* 232* 909*  --  3,742*  --      Estimated Creatinine Clearance: 67.7 mL/min (by C-G formula based on SCr of 1.05 mg/dL).  Assessment: 76 y.o. male with chest pain and Afib-- pharmacy consulted for heparin dosing.   Heparin level today is therapeutic at 0.35, on 1200 units/hr. Hgb 11.7, plt 73--low, stable and pharmacy will continue to monitor. No line issues or signs/symptoms of bleeding reported.  Goal of Therapy:  Heparin level 0.3-0.7 units/ml Monitor platelets by anticoagulation protocol: Yes   Plan:  Continue Heparin @ 1200 units/hr Follow-up 8hr heparin level Monitor heparin level, CBC, and s/sx of bleeding daily    Billey Gosling, PharmD PGY1 Pharmacy Resident 12/17/202310:33 AM

## 2022-04-27 NOTE — Progress Notes (Signed)
   04/27/22 1024  Mobility  Activity Ambulated with assistance to bathroom  Level of Assistance Moderate assist, patient does 50-74%  Assistive Device Front wheel walker  Distance Ambulated (ft) 10 ft  Activity Response Tolerated well  Mobility Referral Yes  $Mobility charge 1 Mobility   Mobility Specialist Progress Note  Pre-Mobility: 136/85 BP, 100% SpO2  Pt requesting to use BR. Had no c/o pain throughout ambulation. Left in BR with NT in room.  Lucious Groves Mobility Specialist  Please contact via SecureChat or Rehab office at [phone]

## 2022-04-27 NOTE — Progress Notes (Incomplete)
Rounding Note    Patient Name: Jose Beck Date of Encounter: 04/28/2022  [place] Cardiologist: Peter Martinique, MD   Subjective   Feeling okay this morning. No chest pain or SOB. States he is bruising a lot with the apixaban.   Inpatient Medications    Scheduled Meds:  amiodarone  200 mg Oral BID   Followed by   Derrill Memo ON 05/11/2022] amiodarone  200 mg Oral Daily   apixaban  5 mg Oral BID   aspirin EC  81 mg Oral Daily   atorvastatin  80 mg Oral q1800   calcium-vitamin D  1 tablet Oral Daily   insulin aspart  0-5 Units Subcutaneous QHS   insulin aspart  0-9 Units Subcutaneous TID WC   metoprolol succinate  25 mg Oral Daily   Continuous Infusions:   PRN Meds: acetaminophen, nitroGLYCERIN, ondansetron (ZOFRAN) IV   Vital Signs    Vitals:   04/27/22 1931 04/28/22 0000 04/28/22 0400 04/28/22 0719  BP: (!) 143/86 134/81 135/77 (!) 162/86  Pulse: 75   73  Resp: 19 17 (!) 22 15  Temp: 98.5 F (36.9 C) 98.1 F (36.7 C) 99.1 F (37.3 C) 98.9 F (37.2 C)  TempSrc: Oral Oral Oral Oral  SpO2: 99%  92% 90%  Weight:   96.9 kg   Height:        Intake/Output Summary (Last 24 hours) at 04/28/2022 0834 Last data filed at 04/28/2022 0800 Gross per 24 hour  Intake 660.4 ml  Output 700 ml  Net -39.6 ml      04/28/2022    4:00 AM 04/27/2022    4:00 AM 04/26/2022    4:48 PM  Last 3 Weights  Weight (lbs) 213 lb 10 oz 214 lb 15.2 oz 216 lb 7.9 oz  Weight (kg) 96.9 kg 97.5 kg 98.2 kg      Telemetry   NSR, rare PVCs - Personally Reviewed  ECG    No new ECG today - Personally Reviewed  Physical Exam   GEN: No acute distress.  Sitting in bed Neck: No JVD Cardiac: RRR, 2/6 systolic murmur Respiratory: Clear to auscultation bilaterally. GI: Soft, nontender, non-distended  MS: No edema; warm Neuro:  Alert and oriented Psych: Normal affect   Labs    High Sensitivity Troponin:   Recent Labs  Lab 04/26/22 0730 04/26/22 0851  04/26/22 1242 04/26/22 2238  TROPONINIHS 22* 232* 909* 3,742*     Chemistry Recent Labs  Lab 04/26/22 0730 04/27/22 0601 04/28/22 0431  NA 138 136 134*  K 4.3 4.0 4.0  CL 108 101 100  CO2 '22 22 24  '$ GLUCOSE 130* 118* 128*  BUN 25* 18 17  CREATININE 1.20 1.05 1.08  CALCIUM 9.0 9.1 8.9  MG 1.9  --   --   PROT 6.5  --   --   ALBUMIN 3.5  --   --   AST 21  --   --   ALT 15  --   --   ALKPHOS 40  --   --   BILITOT 0.7  --   --   GFRNONAA >60 >60 >60  ANIONGAP '8 13 10    '$ Lipids  Recent Labs  Lab 04/28/22 0431  CHOL 127  TRIG 59  HDL 55  LDLCALC 60  CHOLHDL 2.3    Hematology Recent Labs  Lab 04/26/22 0730 04/26/22 1742 04/27/22 0601 04/27/22 1031 04/28/22 0044  WBC 5.4  --  4.3  --  5.1  RBC 3.66*  --  3.64*  --  3.84*  HGB 11.9*   < > 11.7* 13.0 12.4*  HCT 36.0*   < > 35.0* 39.8 37.0*  MCV 98.4  --  96.2  --  96.4  MCH 32.5  --  32.1  --  32.3  MCHC 33.1  --  33.4  --  33.5  RDW 13.2  --  12.9  --  12.7  PLT 90*  --  73*  --  75*   < > = values in this interval not displayed.   Thyroid  Recent Labs  Lab 04/27/22 0601  TSH 1.364    BNPNo results for input(s): "BNP", "PROBNP" in the last 168 hours.  DDimer No results for input(s): "DDIMER" in the last 168 hours.   Radiology    ECHOCARDIOGRAM COMPLETE  Result Date: 04/26/2022    ECHOCARDIOGRAM REPORT   Patient Name:   Jose Beck Date of Exam: 04/26/2022 Medical Rec #:  [id number]           Height:       67.0 in Accession #:    [id number]          Weight:       216.0 lb Date of Birth:  [birth year]          BSA:          2.090 m Patient Age:    76 years            BP:           108/91 mmHg Patient Gender: M                   HR:           126 bpm. Exam Location:  Inpatient Procedure: 2D Echo, Color Doppler and Cardiac Doppler Indications:    Atrial Fibrillation I48.91  History:        Patient has prior history of Echocardiogram examinations, most                 recent 12/20/2021. CHF, CAD, Stroke,  Aortic Valve Disease,                 Arrythmias:Atrial Fibrillation, Signs/Symptoms:Chest Pain; Risk                 Factors:Diabetes, Hypertension, Dyslipidemia and Non-Smoker.  Sonographer:    Greer Pickerel Referring Phys: [id number] Margie Billet  Sonographer Comments: Technically difficult study due to poor echo windows. Image acquisition challenging due to patient body habitus and Image acquisition challenging due to respiratory motion. IMPRESSIONS  1. Poor acoustic windows  2. The left ventricular systolic function appears to be moderate to severely reduced. Accurate estimation of LVEF limited by tachycardia. Left ventricular endocardial border not optimally defined to evaluate regional wall motion. Left ventricular diastolic function could not be evaluated due to atrial fibrillation. Recommend Limited Echo with contrast for assessment of LVEF and RWMA after tachycardia is resolved.  3. Right ventricular systolic function was not well visualized. The right ventricular size is not well visualized. There is mildly elevated pulmonary artery systolic pressure.  4. Left atrial size was severely dilated.  5. Right atrial size was severely dilated.  6. The mitral valve is abnormal. Mild mitral valve regurgitation. No evidence of mitral stenosis.  7. The aortic valve was not well visualized. There is severe calcifcation of the aortic valve. Non-diasgnostic aortic valve doppler.  8. The inferior vena cava is normal  in size with greater than 50% respiratory variability, suggesting right atrial pressure of 3 mmHg. Comparison(s): Changes from prior study are noted. LVEF appears to be worse. FINDINGS  Left Ventricle: The left ventricle has appears to be severely reduced function. Left ventricular endocardial border not optimally defined to evaluate regional wall motion. The left ventricular internal cavity size was normal in size. There is no left ventricular hypertrophy. Left ventricular diastolic function could not be  evaluated due to atrial fibrillation. Left ventricular diastolic function could not be evaluated. Right Ventricle: The right ventricular size is not well visualized. Right vetricular wall thickness was not well visualized. Right ventricular systolic function was not well visualized. There is mildly elevated pulmonary artery systolic pressure. The tricuspid regurgitant velocity is 2.72 m/s, and with an assumed right atrial pressure of 8 mmHg, the estimated right ventricular systolic pressure is 50.9 mmHg. Left Atrium: Left atrial size was severely dilated. Right Atrium: Right atrial size was severely dilated. Pericardium: There is no evidence of pericardial effusion. Mitral Valve: The mitral valve is abnormal. There is moderate calcification of the mitral valve leaflet(s). Moderately decreased mobility of the mitral valve leaflets. Mild mitral annular calcification. Mild mitral valve regurgitation. No evidence of mitral valve stenosis. Tricuspid Valve: The tricuspid valve is not well visualized. Tricuspid valve regurgitation is trivial. No evidence of tricuspid stenosis. Aortic Valve: The aortic valve was not well visualized. There is severe calcifcation of the aortic valve. Pulmonic Valve: The pulmonic valve was not well visualized. Pulmonic valve regurgitation is not visualized. No evidence of pulmonic stenosis. Aorta: The aortic root is normal in size and structure. Venous: The inferior vena cava is normal in size with greater than 50% respiratory variability, suggesting right atrial pressure of 3 mmHg. IAS/Shunts: The interatrial septum was not well visualized.  LEFT VENTRICLE PLAX 2D LVOT diam:     1.90 cm      Diastology LV SV:         71           LV e' medial:    9.36 cm/s LV SV Index:   34           LV E/e' medial:  10.9 LVOT Area:     2.84 cm     LV e' lateral:   14.10 cm/s                             LV E/e' lateral: 7.2  LV Volumes (MOD) LV vol d, MOD A2C: 76.5 ml LV vol d, MOD A4C: 136.0 ml LV vol s,  MOD A2C: 66.3 ml LV vol s, MOD A4C: 109.0 ml LV SV MOD A2C:     10.2 ml LV SV MOD A4C:     136.0 ml LV SV MOD BP:      14.3 ml RIGHT VENTRICLE RV S prime:     10.90 cm/s TAPSE (M-mode): 1.4 cm LEFT ATRIUM            Index        RIGHT ATRIUM           Index LA Vol (A4C): 117.0 ml 55.99 ml/m  RA Area:     27.00 cm                                     RA Volume:   95.40 ml  45.65 ml/m  AORTIC VALVE LVOT Vmax:   137.90 cm/s LVOT Vmean:  90.600 cm/s LVOT VTI:    0.250 m  AORTA Ao Root diam: 3.50 cm MITRAL VALVE                TRICUSPID VALVE MV Area (PHT): 3.30 cm     TR Peak grad:   29.6 mmHg MV Decel Time: 230 msec     TR Vmax:        272.00 cm/s MV E velocity: 102.00 cm/s                             SHUNTS                             Systemic VTI:  0.25 m                             Systemic Diam: 1.90 cm Vishnu Priya Mallipeddi Electronically signed by Lorelee Cover Mallipeddi Signature Date/Time: 04/26/2022/3:47:03 PM    Final    CT Head Wo Contrast  Result Date: 04/26/2022 CLINICAL DATA:  Acute headache. EXAM: CT HEAD WITHOUT CONTRAST TECHNIQUE: Contiguous axial images were obtained from the base of the skull through the vertex without intravenous contrast. RADIATION DOSE REDUCTION: This exam was performed according to the departmental dose-optimization program which includes automated exposure control, adjustment of the mA and/or kV according to patient size and/or use of iterative reconstruction technique. COMPARISON:  12/19/2021 FINDINGS: Brain: No evidence of intracranial hemorrhage, acute infarction, hydrocephalus, extra-axial collection, or mass lesion/mass effect. Stable moderate cerebral and cerebellar atrophy. Right cerebellar encephalomalacia is stable, likely due to old infarct. Multiple old lacunar infarcts are again seen involving the left cerebellum, left basal ganglia and left thalamus. Mild chronic small vessel disease is also stable. Vascular:  No hyperdense vessel or other acute findings.  Skull: No evidence of fracture or other significant bone abnormality. Sinuses/Orbits:  No acute findings. Other: None. IMPRESSION: No acute intracranial abnormality. Stable cerebral and cerebellar atrophy, old infarcts, and chronic small vessel disease as described above. Electronically Signed   By: Marlaine Hind M.D.   On: 04/26/2022 12:34    Cardiac Studies   TTE 04/26/22: IMPRESSIONS     1. Poor acoustic windows   2. The left ventricular systolic function appears to be moderate to  severely reduced. Accurate estimation of LVEF limited by tachycardia. Left  ventricular endocardial border not optimally defined to evaluate regional  wall motion. Left ventricular  diastolic function could not be evaluated due to atrial fibrillation.  Recommend Limited Echo with contrast for assessment of LVEF and RWMA after  tachycardia is resolved.   3. Right ventricular systolic function was not well visualized. The right  ventricular size is not well visualized. There is mildly elevated  pulmonary artery systolic pressure.   4. Left atrial size was severely dilated.   5. Right atrial size was severely dilated.   6. The mitral valve is abnormal. Mild mitral valve regurgitation. No  evidence of mitral stenosis.   7. The aortic valve was not well visualized. There is severe calcifcation  of the aortic valve. Non-diasgnostic aortic valve doppler.   8. The inferior vena cava is normal in size with greater than 50%  respiratory variability, suggesting right atrial pressure of 3 mmHg.   Comparison(s): Changes from prior study  are noted. LVEF appears to be  worse.   Patient Profile     76 y.o. male ith a hx of CAD (known occlusion of LAD and RCA by cath in 1992 with repeat cath in 2017 showing similar findings with collateral flow noted), chronic diastolic CHF, HTN, HLD, type 2 DM, GERD, history of CVA, AAA, and history of falls who is being seen 04/26/2022 for the evaluation of chest pain, atrial  fibrillation    Assessment & Plan    #Atrial fibrillation:  Patient presented with chest pain found to be in new onset A-fib with RVR.  Heart rate initially in 150s to 190s and soft BP. Was started on IV amiodarone and has since converted to NSR. -Continue amiodarone '200mg'$  BID x 2 weeks and then '200mg'$  daily thereafter -Continue metoprolol '25mg'$  XL daily -Continue apixaban '5mg'$  BID -May consider watchman if patient amenable due to ataxia with frequent falls   #Acute systolic heart failure: TTE 12/16 was poor quality study but LV systolic function appeared to be moderate to severely reduced.  Difficult to evaluate LVEF given A-fib with RVR and poor quality study.  Now that back in sinus rhythm, patient is planned for repeat limited echo with contrast.  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 Currently appears euvolemic on exam. -Follow-up repeat TTE to assess LVEF -Continue metop '25mg'$  XL daily -Start entresto 24/'26mg'$  BID -Start farxiga '10mg'$  daily -Will add spiro as able pending renal function -Monitor I/Os and daily weights  #Elevated troponin: #Known CAD: Troponin up to 3742.  Suspect this represents demand ischemia from A-fib with RVR in setting of underlying obstructive CAD.  Cath in 2017 showed occluded mid RCA, occluded mid LAD, moderate distal Lcx.  Medical management was recommended.  He has declined repeat cath on this admission. -Discussed repeating cath given marked troponin elevation but patient is adamant that does not want any procedures done -Plavix stopped with starting Eliquis as above.  Will plan ASA+Eliquis moving forward -Continue lipitor '80mg'$  daily  #AAA: Followed by vascular surgery. 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 Followed with biannual CT scans.  #History of CVA: -Continue ASA and apixaban as  above -Continue lipitor '80mg'$  daily   For questions or updates, please contact [facility] Please consult [email or web address] for contact info under        Signed, Freada Bergeron, MD  04/28/2022, 8:34 AM

## 2022-04-27 NOTE — Progress Notes (Addendum)
ANTICOAGULATION CONSULT NOTE - Initial Consult  Pharmacy Consult for heparin>>Eliquis  Indication: atrial fibrillation  Allergies  Allergen Reactions   Cheese Nausea And Vomiting   Pravastatin Anxiety    Anxiety attack    Patient Measurements: Height: '5\' 8"'$  (172.7 cm) Weight: 97.5 kg (214 lb 15.2 oz) IBW/kg (Calculated) : 68.4  Vital Signs: Temp: 99.1 F (37.3 C) (12/17 0400) Temp Source: Oral (12/17 0400) BP: 136/85 (12/17 1012) Pulse Rate: 69 (12/17 1012)  Labs: Recent Labs    04/26/22 0730 04/26/22 0851 04/26/22 1242 04/26/22 1742 04/26/22 2238 04/27/22 0601 04/27/22 1031  HGB 11.9*  --   --    < > 11.8* 11.7* 13.0  HCT 36.0*  --   --    < > 33.9* 35.0* 39.8  PLT 90*  --   --   --   --  73*  --   HEPARINUNFRC  --   --   --   --  0.27* 0.35  --   CREATININE 1.20  --   --   --   --  1.05  --   TROPONINIHS 22* 232* 909*  --  3,742*  --   --    < > = values in this interval not displayed.    Estimated Creatinine Clearance: 67.7 mL/min (by C-G formula based on SCr of 1.05 mg/dL).   Medical History: Past Medical History:  Diagnosis Date   Abdominal aortic aneurysm (AAA) ([place])    Aortic stenosis    a. 11/2015 Echo: EF 55-60%, Gr1 DD, mild AS.   Chronic combined systolic and diastolic CHF (congestive heart failure) ([place])    a. 07/2015: TEE showing EF of 40% b. 11/2015: Echo w/ EF 55-60%, Grade 1 DD, mild AS.   Coronary artery disease    a. patient reports 100% stenosis of LAD and RCA by cath in 1999. b. 11/2015 Cath: LM nl, LAD 100 - fills by L->L collats from D1, LCX 60d, OM1 lalrge/nl, OM2 small/nl, RCA 179m L->R collats.EF 35-45% (55-60% by echo).   Diabetes mellitus without complication [place]    Type II   GERD (gastroesophageal reflux disease)    Hyperlipemia    Hypertension    Hypertensive heart disease    Stroke ([place]    a. 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   [place]ocytopenia [place] 11[place]2015    Medications:  Medications Prior to Admission   Medication Sig Dispense Refill Last Dose   acetaminophen (TYLENOL) 650 MG CR tablet Take 650 mg by mouth in the morning, at noon, and at bedtime.   04/25/2022   atorvastatin (LIPITOR) 80 MG tablet Take 1 tablet (80 mg total) by mouth daily at 6 PM. 30 tablet 6 04/25/2022   calcium-vitamin D 250-100 MG-UNIT tablet Take 1 tablet by mouth daily. Takes in morning '600mg'$    04/25/2022   carvedilol (COREG) 25 MG tablet Take 25 mg by mouth 2 (two) times daily with a meal.   04/25/2022 at 1800   clopidogrel (PLAVIX) 75 MG tablet Take 75 mg by mouth daily.   04/25/2022   insulin detemir (LEVEMIR) 100 UNIT/ML injection Inject 10 Units into the skin daily.   04/25/2022   losartan (COZAAR) 50 MG tablet Take 50 mg by mouth daily.   04/25/2022   nitroGLYCERIN (NITROSTAT) 0.4 MG SL tablet Place 0.4 mg under the tongue every 5 (five) minutes as needed for chest pain.   04/25/2022   tacrolimus (PROTOPIC) 0.1 % ointment Apply 1 application topically daily as needed (  facial scaling).   Past Week   COVID-19 mRNA vaccine 2023-2024 (COMIRNATY) syringe Inject into the muscle. 0.3 mL 0    Scheduled:   amiodarone  200 mg Oral BID   Followed by   Derrill Memo ON 05/11/2022] amiodarone  200 mg Oral Daily   [START ON 04/28/2022] aspirin EC  81 mg Oral Daily   atorvastatin  80 mg Oral q1800   calcium-vitamin D  1 tablet Oral Daily   insulin aspart  0-5 Units Subcutaneous QHS   insulin aspart  0-9 Units Subcutaneous TID WC   [START ON 04/28/2022] metoprolol succinate  25 mg Oral Daily    Assessment: 76 y.o. male with chest pain and Afib. Pharmacy originally consulted for heparin dosing and now transitioning to Eliquis.   Hgb 11.7, plts 73--low, stable and pharmacy will continue to monitor. No signs/symptoms of bleeding reported.   Goal of Therapy:  Monitor platelets by anticoagulation protocol: Yes   Plan:  Discontinue heparin drip  Start apixaban '5mg'$  BID today  Monitor for s/sx of bleeding    Billey Gosling,  PharmD PGY1 Pharmacy Resident 12/17/20232:48 PM

## 2022-04-27 NOTE — TOC Progression Note (Signed)
Transition of Care [facility]) - Progression Note    Patient Details  Name: Jose Beck MRN: [id number] Date of Birth: 27-Feb-1946  Transition of Care [facility] CM/SW Contact  Zenon Mayo, RN Phone Number: 04/27/2022, 10:28 AM  Clinical Narrative:    from home with wife, nes onset afib, NSTEMI, hx of CVA, chronic CHF, AAA.  TOC following.        Expected Discharge Plan and Services                                                 Social Determinants of Health (SDOH) Interventions Housing Interventions: Other (Comment)  Readmission Risk Interventions     No data to display

## 2022-04-28 ENCOUNTER — Other Ambulatory Visit (HOSPITAL_COMMUNITY): Payer: Self-pay

## 2022-04-28 ENCOUNTER — Inpatient Hospital Stay (HOSPITAL_COMMUNITY): Payer: Medicare Other

## 2022-04-28 DIAGNOSIS — I4891 Unspecified atrial fibrillation: Secondary | ICD-10-CM | POA: Diagnosis not present

## 2022-04-28 DIAGNOSIS — I714 Abdominal aortic aneurysm, without rupture, unspecified: Secondary | ICD-10-CM | POA: Diagnosis not present

## 2022-04-28 DIAGNOSIS — I5021 Acute systolic (congestive) heart failure: Secondary | ICD-10-CM | POA: Diagnosis not present

## 2022-04-28 DIAGNOSIS — I2510 Atherosclerotic heart disease of native coronary artery without angina pectoris: Secondary | ICD-10-CM | POA: Diagnosis not present

## 2022-04-28 LAB — CBC
HCT: 37 % — ABNORMAL LOW (ref 39.0–52.0)
Hemoglobin: 12.4 g/dL — ABNORMAL LOW (ref 13.0–17.0)
MCH: 32.3 pg (ref 26.0–34.0)
MCHC: 33.5 g/dL (ref 30.0–36.0)
MCV: 96.4 fL (ref 80.0–100.0)
Platelets: 75 10*3/uL — ABNORMAL LOW (ref 150–400)
RBC: 3.84 MIL/uL — ABNORMAL LOW (ref 4.22–5.81)
RDW: 12.7 % (ref 11.5–15.5)
WBC: 5.1 10*3/uL (ref 4.0–10.5)
nRBC: 0 % (ref 0.0–0.2)

## 2022-04-28 LAB — BASIC METABOLIC PANEL
Anion gap: 10 (ref 5–15)
BUN: 17 mg/dL (ref 8–23)
CO2: 24 mmol/L (ref 22–32)
Calcium: 8.9 mg/dL (ref 8.9–10.3)
Chloride: 100 mmol/L (ref 98–111)
Creatinine, Ser: 1.08 mg/dL (ref 0.61–1.24)
GFR, Estimated: >60 mL/min (ref >60)
Glucose, Bld: 128 mg/dL — ABNORMAL HIGH (ref 70–99)
Potassium: 4 mmol/L (ref 3.5–5.1)
Sodium: 134 mmol/L — ABNORMAL LOW (ref 135–145)

## 2022-04-28 LAB — ECHOCARDIOGRAM LIMITED
Area-P 1/2: 3.53 cm2
Height: 68 in
S' Lateral: 3.3 cm
Weight: 3418.01 oz

## 2022-04-28 LAB — LIPID PANEL
Cholesterol: 127 mg/dL (ref 0–200)
HDL: 55 mg/dL (ref >40)
LDL Cholesterol: 60 mg/dL (ref 0–99)
Total CHOL/HDL Ratio: 2.3 RATIO
Triglycerides: 59 mg/dL (ref <150)
VLDL: 12 mg/dL (ref 0–40)

## 2022-04-28 LAB — GLUCOSE, CAPILLARY
Glucose-Capillary: 133 mg/dL — ABNORMAL HIGH (ref 70–99)
Glucose-Capillary: 126 mg/dL — ABNORMAL HIGH (ref 70–99)
Glucose-Capillary: 118 mg/dL — ABNORMAL HIGH (ref 70–99)
Glucose-Capillary: 120 mg/dL — ABNORMAL HIGH (ref 70–99)

## 2022-04-28 MED ORDER — SACUBITRIL-VALSARTAN 24-26 MG PO TABS
1.0000 | ORAL_TABLET | Freq: Two times a day (BID) | ORAL | Status: DC
  Administered 2022-04-28 – 2022-04-29 (×3): 1 via ORAL
  Filled 2022-04-28 (×3): qty 1

## 2022-04-28 MED ORDER — PERFLUTREN LIPID MICROSPHERE
1.0000 mL | INTRAVENOUS | Status: AC | PRN
  Administered 2022-04-28: 2 mL via INTRAVENOUS

## 2022-04-28 MED ORDER — DAPAGLIFLOZIN PROPANEDIOL 10 MG PO TABS
10.0000 mg | ORAL_TABLET | Freq: Every day | ORAL | Status: DC
  Administered 2022-04-28 – 2022-04-29 (×2): 10 mg via ORAL
  Filled 2022-04-28 (×2): qty 1

## 2022-04-28 NOTE — Progress Notes (Incomplete)
Rounding Note    Patient Name: Jose Beck Date of Encounter: 04/28/2022  [place] Cardiologist: Peter Martinique, MD   Subjective   TTE with LVEF 45-50% with hypokinesis with mid-to-distal anteroseptal, apical anterior and apex hypokinesis.  Inpatient Medications    Scheduled Meds:  amiodarone  200 mg Oral BID   Followed by   Derrill Memo ON 05/11/2022] amiodarone  200 mg Oral Daily   apixaban  5 mg Oral BID   aspirin EC  81 mg Oral Daily   atorvastatin  80 mg Oral q1800   calcium-vitamin D  1 tablet Oral Daily   dapagliflozin propanediol  10 mg Oral Daily   insulin aspart  0-5 Units Subcutaneous QHS   insulin aspart  0-9 Units Subcutaneous TID WC   metoprolol succinate  25 mg Oral Daily   sacubitril-valsartan  1 tablet Oral BID   Continuous Infusions:   PRN Meds: acetaminophen, nitroGLYCERIN, ondansetron (ZOFRAN) IV   Vital Signs    Vitals:   04/28/22 0400 04/28/22 0719 04/28/22 1105 04/28/22 1553  BP: 135/77 (!) 162/86 139/79 (!) 140/82  Pulse:  73 68 64  Resp: (!) '22 15 17 18  '$ Temp: 99.1 F (37.3 C) 98.9 F (37.2 C) 99.1 F (37.3 C) 98.7 F (37.1 C)  TempSrc: Oral Oral Oral Oral  SpO2: 92% 90% 97% 96%  Weight: 96.9 kg     Height:        Intake/Output Summary (Last 24 hours) at 04/28/2022 1755 Last data filed at 04/28/2022 1300 Gross per 24 hour  Intake 236 ml  Output 700 ml  Net -464 ml       04/28/2022    4:00 AM 04/27/2022    4:00 AM 04/26/2022    4:48 PM  Last 3 Weights  Weight (lbs) 213 lb 10 oz 214 lb 15.2 oz 216 lb 7.9 oz  Weight (kg) 96.9 kg 97.5 kg 98.2 kg      Telemetry   NSR, rare PVCs - Personally Reviewed  ECG    No new ECG today - Personally Reviewed  Physical Exam   GEN: No acute distress.  Sitting in bed Neck: No JVD Cardiac: RRR, 2/6 systolic murmur Respiratory: Clear to auscultation bilaterally. GI: Soft, nontender, non-distended  MS: No edema; warm Neuro:  Alert and oriented Psych: Normal  affect   Labs    High Sensitivity Troponin:   Recent Labs  Lab 04/26/22 0730 04/26/22 0851 04/26/22 1242 04/26/22 2238  TROPONINIHS 22* 232* 909* 3,742*      Chemistry Recent Labs  Lab 04/26/22 0730 04/27/22 0601 04/28/22 0431  NA 138 136 134*  K 4.3 4.0 4.0  CL 108 101 100  CO2 '22 22 24  '$ GLUCOSE 130* 118* 128*  BUN 25* 18 17  CREATININE 1.20 1.05 1.08  CALCIUM 9.0 9.1 8.9  MG 1.9  --   --   PROT 6.5  --   --   ALBUMIN 3.5  --   --   AST 21  --   --   ALT 15  --   --   ALKPHOS 40  --   --   BILITOT 0.7  --   --   GFRNONAA >60 >60 >60  ANIONGAP '8 13 10     '$ Lipids  Recent Labs  Lab 04/28/22 0431  CHOL 127  TRIG 59  HDL 55  LDLCALC 60  CHOLHDL 2.3     Hematology Recent Labs  Lab 04/26/22 0730 04/26/22 1742 04/27/22  0601 04/27/22 1031 04/28/22 0044  WBC 5.4  --  4.3  --  5.1  RBC 3.66*  --  3.64*  --  3.84*  HGB 11.9*   < > 11.7* 13.0 12.4*  HCT 36.0*   < > 35.0* 39.8 37.0*  MCV 98.4  --  96.2  --  96.4  MCH 32.5  --  32.1  --  32.3  MCHC 33.1  --  33.4  --  33.5  RDW 13.2  --  12.9  --  12.7  PLT 90*  --  73*  --  75*   < > = values in this interval not displayed.    Thyroid  Recent Labs  Lab 04/27/22 0601  TSH 1.364     BNPNo results for input(s): "BNP", "PROBNP" in the last 168 hours.  DDimer No results for input(s): "DDIMER" in the last 168 hours.   Radiology    ECHOCARDIOGRAM LIMITED  Result Date: 04/28/2022    ECHOCARDIOGRAM LIMITED REPORT   Patient Name:   Izayah Miner Date of Exam: 04/28/2022 Medical Rec #:  [id number]           Height:       68.0 in Accession #:    [id number]          Weight:       213.6 lb Date of Birth:  [birth year]          BSA:          2.102 m Patient Age:    6 years            BP:           162/86 mmHg Patient Gender: M                   HR:           74 bpm. Exam Location:  Inpatient Procedure: Limited Echo, Limited Color Doppler and Intracardiac Opacification            Agent Indications:      CHF-Acute Systolic O87.86  History:         Patient has prior history of Echocardiogram examinations, most                  recent 04/26/2022. CHF, CAD, Stroke, Arrythmias:Atrial                  Fibrillation and Tachycardia, Signs/Symptoms:Chest Pain; Risk                  Factors:Diabetes, Hypertension and Dyslipidemia.  Sonographer:     Ronny Flurry Referring Phys:  Donato Heinz Diagnosing Phys: Eleonore Chiquito MD IMPRESSIONS  1. Left ventricular ejection fraction, by estimation, is 45 to 50%. The left ventricle has mildly decreased function. The left ventricle demonstrates regional wall motion abnormalities (see scoring diagram/findings for description). There is mild concentric left ventricular hypertrophy. Left ventricular diastolic parameters are consistent with Grade I diastolic dysfunction (impaired relaxation).  2. Right ventricular systolic function is normal. The right ventricular size is normal. Tricuspid regurgitation signal is inadequate for assessing PA pressure.  3. The mitral valve is grossly normal. Trivial mitral valve regurgitation. No evidence of mitral stenosis.  4. The aortic valve is calcified.  5. The inferior vena cava is normal in size with greater than 50% respiratory variability, suggesting right atrial pressure of 3 mmHg. Comparison(s): Changes from prior study are noted. The left ventricular function has improved. FINDINGS  Left Ventricle: Left ventricular ejection fraction, by estimation, is 45 to 50%. The left ventricle has mildly decreased function. The left ventricle demonstrates regional wall motion abnormalities. Definity contrast agent was given IV to delineate the left ventricular endocardial borders. The left ventricular internal cavity size was normal in size. There is mild concentric left ventricular hypertrophy. Left ventricular diastolic parameters are consistent with Grade I diastolic dysfunction (impaired relaxation).  LV Wall Scoring: The mid and distal  anterior septum, apical anterior segment, and apex are hypokinetic. Right Ventricle: The right ventricular size is normal. No increase in right ventricular wall thickness. Right ventricular systolic function is normal. Tricuspid regurgitation signal is inadequate for assessing PA pressure. Pericardium: There is no evidence of pericardial effusion. Mitral Valve: The mitral valve is grossly normal. Trivial mitral valve regurgitation. No evidence of mitral valve stenosis. Aortic Valve: The aortic valve is calcified. Aorta: The aortic root and ascending aorta are structurally normal, with no evidence of dilitation. Venous: The inferior vena cava is normal in size with greater than 50% respiratory variability, suggesting right atrial pressure of 3 mmHg. LEFT VENTRICLE PLAX 2D LVIDd:         4.70 cm   Diastology LVIDs:         3.30 cm   LV e' medial:    5.87 cm/s LV PW:         1.20 cm   LV E/e' medial:  11.7 LV IVS:        1.20 cm   LV e' lateral:   7.40 cm/s LVOT diam:     2.10 cm   LV E/e' lateral: 9.3 LV SV:         55 LV SV Index:   26 LVOT Area:     3.46 cm  RIGHT VENTRICLE RV S prime:     10.10 cm/s TAPSE (M-mode): 2.2 cm LEFT ATRIUM         Index LA diam:    4.90 cm 2.33 cm/m  AORTIC VALVE LVOT Vmax:   83.10 cm/s LVOT Vmean:  53.600 cm/s LVOT VTI:    0.159 m  AORTA Ao Root diam: 3.60 cm MITRAL VALVE MV Area (PHT): 3.53 cm    SHUNTS MV Decel Time: 215 msec    Systemic VTI:  0.16 m MV E velocity: 68.60 cm/s  Systemic Diam: 2.10 cm MV A velocity: 99.80 cm/s MV E/A ratio:  0.69 Eleonore Chiquito MD Electronically signed by Eleonore Chiquito MD Signature Date/Time: 04/28/2022/4:03:24 PM    Final (Updated)     Cardiac Studies   TTE 04/26/22: IMPRESSIONS     1. Poor acoustic windows   2. The left ventricular systolic function appears to be moderate to  severely reduced. Accurate estimation of LVEF limited by tachycardia. Left  ventricular endocardial border not optimally defined to evaluate regional  wall motion.  Left ventricular  diastolic function could not be evaluated due to atrial fibrillation.  Recommend Limited Echo with contrast for assessment of LVEF and RWMA after  tachycardia is resolved.   3. Right ventricular systolic function was not well visualized. The right  ventricular size is not well visualized. There is mildly elevated  pulmonary artery systolic pressure.   4. Left atrial size was severely dilated.   5. Right atrial size was severely dilated.   6. The mitral valve is abnormal. Mild mitral valve regurgitation. No  evidence of mitral stenosis.   7. The aortic valve was not well visualized. There is severe calcifcation  of the  aortic valve. Non-diasgnostic aortic valve doppler.   8. The inferior vena cava is normal in size with greater than 50%  respiratory variability, suggesting right atrial pressure of 3 mmHg.   Comparison(s): Changes from prior study are noted. LVEF appears to be  worse.   Patient Profile     76 y.o. male ith a hx of CAD (known occlusion of LAD and RCA by cath in 1992 with repeat cath in 2017 showing similar findings with collateral flow noted), chronic diastolic CHF, HTN, HLD, type 2 DM, GERD, history of CVA, AAA, and history of falls who is being seen 04/26/2022 for the evaluation of chest pain, atrial fibrillation    Assessment & Plan    #Atrial fibrillation:  Patient presented with chest pain found to be in new onset A-fib with RVR.  Heart rate initially in 150s to 190s and soft BP. Was started on IV amiodarone and has since converted to NSR. -Continue amiodarone '200mg'$  BID x 2 weeks and then '200mg'$  daily thereafter -Continue metoprolol '25mg'$  XL daily -Continue apixaban '5mg'$  BID -May consider watchman if patient amenable due to ataxia with frequent falls   #Acute systolic heart failure: TTE 12/16 was poor quality study but LV systolic function appeared to be moderate to severely reduced.  Repeat TTE 04/28/22 with LVEF 45-50% with mid-to-apical  anteroseptal, apical anterior and apex hypokinesis. Has declined cath. Will continued with medical management. -Declined cath -Continue metop '25mg'$  XL daily -Continue entresto 24/'26mg'$  BID -Continue farxiga '10mg'$  daily -Will add spiro as able pending renal function -Monitor I/Os and daily weights  #Elevated troponin: #Known CAD: Troponin up to 3742.  Suspect this represents demand ischemia from A-fib with RVR in setting of underlying obstructive CAD.  Cath in 2017 showed occluded mid RCA, occluded mid LAD, moderate distal Lcx.  Medical management was recommended.  He has declined repeat cath on this admission. -Discussed repeating cath given marked troponin elevation but patient is adamant that does not want any procedures done -Plavix stopped with starting Eliquis as above.  Will plan ASA+Eliquis moving forward -Continue lipitor '80mg'$  daily  #AAA: Followed by vascular surgery. 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 Followed with biannual CT scans.  #History of CVA: -Continue ASA and apixaban as above -Continue lipitor '80mg'$  daily   For questions or updates, please contact [place] Please consult [email or web address] for contact info under        Signed, Freada Bergeron, MD  04/28/2022, 5:55 PM

## 2022-04-28 NOTE — Discharge Instructions (Addendum)

## 2022-04-28 NOTE — Progress Notes (Signed)
Mobility Specialist Progress Note:   04/28/22 1150  Mobility  Activity Ambulated with assistance in hallway  Level of Assistance Contact guard assist, steadying assist  Assistive Device Cane  Distance Ambulated (ft) 118 ft  Activity Response Tolerated well  $Mobility charge 1 Mobility   Pt received in bed willing to participate in mobility. No complaints of pain. Left in bed with call bell in reach and all needs met.   Gareth Eagle Ilario Dhaliwal Mobility Specialist Please contact via [employer] or  Rehab Office at [phone]

## 2022-04-28 NOTE — Progress Notes (Incomplete)
Echocardiogram 2D Echocardiogram has been performed.  Jose Beck 04/28/2022, 2:05 PM

## 2022-04-28 NOTE — TOC Benefit Eligibility Note (Signed)
Patient Teacher, English as a foreign language completed.    The patient is currently admitted and upon discharge could be taking Entresto 24-26 mg.  The current 30 day co-pay is $153.78 due to a $37.47 deductible remaining.   The patient is currently admitted and upon discharge could be taking Farxiga 10 mg.  The current 30 day co-pay is $134.93 due to a $37.47 deductible remaining.   The patient is currently admitted and upon discharge could be taking Eliquis 5 mg.  The current 30 day co-pay is $134.09 due to a $37.47 deductible remaining.   The patient is insured through [employer] Medicare part D   Lyndel Safe, [place] Patient Advocate Specialist [place] Patient Advocate Team Direct Number: [phone]  Fax: [phone]

## 2022-04-28 NOTE — Progress Notes (Signed)
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 Patient reported that he woke up around 4:40 AM on the day of admission with chest discomfort, burning sensation 5/10, nonradiating, not associated with nausea, palpitations or dizziness.  He took nitro x 1, with partial relief.   In ED, was noted to be in new onset atrial fibrillation with RVR, initially given Cardizem IV, due to sodium and low BP, subsequently started on IV amiodarone drip.  First set of troponin elevated at 20, however second set in 200s, prompted cardiology evaluation. Patient was also placed on IV heparin drip after CT head, evaluated by PCCM.  Patient was admitted for further workup.  Assessment and Plan: Principal Problem:   Atrial fibrillation with rapid ventricular rate (HCC) -Heart rate currently in 60s, converted to normal sinus rhythm, on amiodarone drip -Hemoglobin stable, on IV heparin drip. CHADS2-VASC score 7 -Continue metoprolol 12.5 mg twice daily -Follow further cardiology recommendations     Active problems Chest pain with NSTEMI with underlying history of CAD -Likely escalated due to demand ischemia with atrial fibrillation with RVR -Troponin trended up from 22-> 232-> 909-> 3742 -Started on IV heparin drip.  CT head obtained prior to heparin showed no acute intracranial abnormality.  Stable cerebral and cerebellar atrophy, old infarcts and chronic small vessel disease. -Further plan per cardiology regarding cardiac cath.   Currently no acute chest pain. -Follow 2D echo, continue Plavix, Lipitor, beta-blocker  -[place] and Entresto started per cardiology   Chronic combined systolic and diastolic CHF -Currently stable, compensated, follow 2D echo   Abdominal aortic aneurysm - Followed closely by vascular surgery as outpatient.  He did have endovascular repair previously but on last visit in September 2023 there was concern for enlarging AAA (from postoperative 5.4 to 6.2 cm and endovascular leak).   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   Weight as of this encounter: 97.5 kg.   Code Status: DNR DVT Prophylaxis:  SCDs Start: 04/26/22 1353     Level of Care: Level of care: Progressive Family Communication: Updated patient's wife at the bedside     Disposition Plan:      Remains inpatient appropriate:         TRH will continue to follow the patient.  Subjective: No complaints this morning. Has not been out of bed yet  Physical Exam:  Vitals:   04/27/22 1931 04/28/22 0000 04/28/22 0400 04/28/22 0719  BP: (!) 143/86 134/81 135/77 (!) 162/86  Pulse: 75   73  Resp: 19 17 (!) 22 15  Temp: 98.5 F (36.9 C) 98.1 F (36.7 C) 99.1  F (37.3 C) 98.9 F (37.2 C)  TempSrc: Oral Oral Oral Oral  SpO2: 99%  92% 90%  Weight:   96.9 kg   Height:       General: Alert and oriented x 3, NAD Cardiovascular: S1 S2 auscultated,  RRR, 2/6 systolic murmur Respiratory: CTAB, no wheezing Gastrointestinal: Soft, nontender, nondistended, + bowel sounds Ext: no pedal edema bilaterally Neuro: residual right-sided weakness from prior stroke Psych: Normal affect    Data Reviewed:  There are no new results to review at this time.  Family Communication: Wife  Time spent: 15 minutes.  Author: Cristela Felt, MD 04/28/2022 10:51 AM  For on call review [email or web address].

## 2022-04-28 NOTE — Evaluation (Signed)
Physical Therapy Evaluation Patient Details Name: Jose Beck MRN: [id number] DOB: [birth year] Today's Date: 04/28/2022  History of Present Illness  Pt is a 76 y/o male admitted 12/16 with complaints of chest pain.  PMHx HTN, HLP, CAD, vertebral art stenosis, brainstem infarct/CVA with residual dysarthria, R sided weakness, ataxia causing recurrent falls, DM, CHF, aortic stenosis, metastatic prostate CA, AAA.  Clinical Impression  Pt admitted with/for CP.  Pt not quite at baseline function, needing min assist, progressing toward min guard with gait. Marland Kitchen  Pt currently limited functionally due to the problems listed below.  (see problems list.)  Pt will benefit from PT to maximize function and safety to be able to get home safely with available assist .        Recommendations for follow up therapy are one component of a multi-disciplinary discharge planning process, led by the attending physician.  Recommendations may be updated based on patient status, additional functional criteria and insurance authorization.  Follow Up Recommendations No PT follow up (pt declines HH or OPPT)      Assistance Recommended at Discharge Set up Supervision/Assistance  Patient can return home with the following  Assistance with cooking/housework;Assist for transportation;Help with stairs or ramp for entrance    Equipment Recommendations None recommended by PT  Recommendations for Other Services       Functional Status Assessment Patient has had a recent decline in their functional status and demonstrates the ability to make significant improvements in function in a reasonable and predictable amount of time.     Precautions / Restrictions Precautions Precautions: Fall      Mobility  Bed Mobility Overal bed mobility: Needs Assistance Bed Mobility: Sit to Supine       Sit to supine: Min assist   General bed mobility comments: moving out of the R side (not the usual side)     Transfers Overall transfer level: Needs assistance   Transfers: Sit to/from Stand, Bed to chair/wheelchair/BSC Sit to Stand: Min guard   Step pivot transfers: Min assist, Min guard            Ambulation/Gait Ambulation/Gait assistance: Min guard Gait Distance (Feet): 130 Feet Assistive device: Straight cane Gait Pattern/deviations: Step-to pattern   Gait velocity interpretation: <1.31 ft/sec, indicative of household ambulator   General Gait Details: pt uses the cane his own special way, which gets the job done, but different than would be originally instructed.  stable step to pattern with wide BOS and cane out1-2 feet on the L.  Stairs            Wheelchair Mobility    Modified Rankin (Stroke Patients Only)       Balance                                             Pertinent Vitals/Pain      Home Living Family/patient expects to be discharged to:: Private residence Living Arrangements: Spouse/significant other Available Help at Discharge: Family;Available 24 hours/day Type of Home: House Home Access: Stairs to enter Entrance Stairs-Rails: None Entrance Stairs-Number of Steps: 2   Home Layout: Two level;Able to live on main level with bedroom/bathroom Home Equipment: Rolling Walker (2 wheels);Cane - single point;Tub bench      Prior Function Prior Level of Function : Needs assist             Mobility  Comments: Using cane. Pt with prior falls as well. ADLs Comments: Pt wife performing most of the cooking and cleaning. Pt independent in ADL, medication management per pt and wife     Hand Dominance   Dominant Hand: Right    Extremity/Trunk Assessment   Upper Extremity Assessment Upper Extremity Assessment: RUE deficits/detail RUE Deficits / Details: used grossly, held stiffly in flexion during gait.    Lower Extremity Assessment Lower Extremity Assessment: RLE deficits/detail RLE Deficits / Details: moves  synergistically.       Communication   Communication: Expressive difficulties  Cognition Arousal/Alertness: Awake/alert Behavior During Therapy: WFL for tasks assessed/performed Overall Cognitive Status: Difficult to assess                                          General Comments General comments (skin integrity, edema, etc.): vss    Exercises     Assessment/Plan    PT Assessment Patient needs continued PT services  PT Problem List Decreased strength;Decreased activity tolerance;Decreased balance;Decreased mobility;Decreased coordination;Decreased knowledge of use of DME       PT Treatment Interventions Gait training;Stair training;Functional mobility training;Therapeutic activities;Balance training;Patient/family education    PT Goals (Current goals can be found in the Care Plan section)  Acute Rehab PT Goals Patient Stated Goal: home PT Goal Formulation: With patient Time For Goal Achievement: 05/05/22 Potential to Achieve Goals: Good    Frequency Min 2X/week     Co-evaluation               AM-PAC PT "6 Clicks" Mobility  Outcome Measure Help needed turning from your back to your side while in a flat bed without using bedrails?: A Little Help needed moving from lying on your back to sitting on the side of a flat bed without using bedrails?: A Little Help needed moving to and from a bed to a chair (including a wheelchair)?: A Little Help needed standing up from a chair using your arms (e.g., wheelchair or bedside chair)?: A Little Help needed to walk in hospital room?: A Little Help needed climbing 3-5 steps with a railing? : A Little 6 Click Score: 18    End of Session   Activity Tolerance: Patient tolerated treatment well Patient left: in bed;with call bell/phone within reach;with family/visitor present Nurse Communication: Mobility status PT Visit Diagnosis: Unsteadiness on feet (R26.81);Other abnormalities of gait and mobility  (R26.89);Difficulty in walking, not elsewhere classified (R26.2)    Time: 1701-1730 PT Time Calculation (min) (ACUTE ONLY): 29 min   Charges:   PT Evaluation $PT Eval Moderate Complexity: 1 Mod PT Treatments $Gait Training: 8-22 mins        04/28/2022  Ginger Carne., PT Acute Rehabilitation Services [phone]  (office)  Jose Beck 04/28/2022, 6:43 PM

## 2022-04-29 ENCOUNTER — Encounter (HOSPITAL_COMMUNITY): Payer: Self-pay | Admitting: Emergency Medicine

## 2022-04-29 ENCOUNTER — Other Ambulatory Visit: Payer: Self-pay

## 2022-04-29 ENCOUNTER — Other Ambulatory Visit (HOSPITAL_COMMUNITY): Payer: Self-pay

## 2022-04-29 ENCOUNTER — Emergency Department (HOSPITAL_COMMUNITY): Payer: Medicare Other

## 2022-04-29 ENCOUNTER — Emergency Department (HOSPITAL_COMMUNITY)
Admission: EM | Admit: 2022-04-29 | Discharge: 2022-04-29 | Disposition: A | Discharge status: Home / Self Care | Payer: Medicare Other | Attending: Emergency Medicine | Admitting: Emergency Medicine

## 2022-04-29 DIAGNOSIS — I714 Abdominal aortic aneurysm, without rupture, unspecified: Secondary | ICD-10-CM | POA: Diagnosis not present

## 2022-04-29 DIAGNOSIS — R55 Syncope and collapse: Secondary | ICD-10-CM | POA: Diagnosis not present

## 2022-04-29 DIAGNOSIS — Z7901 Long term (current) use of anticoagulants: Secondary | ICD-10-CM | POA: Diagnosis not present

## 2022-04-29 DIAGNOSIS — I5021 Acute systolic (congestive) heart failure: Secondary | ICD-10-CM | POA: Diagnosis not present

## 2022-04-29 DIAGNOSIS — I10 Essential (primary) hypertension: Secondary | ICD-10-CM | POA: Diagnosis not present

## 2022-04-29 DIAGNOSIS — Z8673 Personal history of transient ischemic attack (TIA), and cerebral infarction without residual deficits: Secondary | ICD-10-CM

## 2022-04-29 DIAGNOSIS — S0990XA Unspecified injury of head, initial encounter: Secondary | ICD-10-CM | POA: Diagnosis not present

## 2022-04-29 DIAGNOSIS — E119 Type 2 diabetes mellitus without complications: Secondary | ICD-10-CM | POA: Insufficient documentation

## 2022-04-29 DIAGNOSIS — Z7189 Other specified counseling: Secondary | ICD-10-CM | POA: Diagnosis not present

## 2022-04-29 DIAGNOSIS — Z794 Long term (current) use of insulin: Secondary | ICD-10-CM | POA: Diagnosis not present

## 2022-04-29 DIAGNOSIS — Z515 Encounter for palliative care: Secondary | ICD-10-CM

## 2022-04-29 DIAGNOSIS — I2510 Atherosclerotic heart disease of native coronary artery without angina pectoris: Secondary | ICD-10-CM | POA: Diagnosis not present

## 2022-04-29 DIAGNOSIS — I4891 Unspecified atrial fibrillation: Secondary | ICD-10-CM | POA: Diagnosis not present

## 2022-04-29 DIAGNOSIS — I214 Non-ST elevation (NSTEMI) myocardial infarction: Secondary | ICD-10-CM | POA: Diagnosis not present

## 2022-04-29 DIAGNOSIS — Z79899 Other long term (current) drug therapy: Secondary | ICD-10-CM | POA: Insufficient documentation

## 2022-04-29 DIAGNOSIS — S3993XA Unspecified injury of pelvis, initial encounter: Secondary | ICD-10-CM | POA: Diagnosis not present

## 2022-04-29 DIAGNOSIS — R42 Dizziness and giddiness: Secondary | ICD-10-CM | POA: Diagnosis not present

## 2022-04-29 DIAGNOSIS — W19XXXA Unspecified fall, initial encounter: Secondary | ICD-10-CM | POA: Insufficient documentation

## 2022-04-29 DIAGNOSIS — I6381 Other cerebral infarction due to occlusion or stenosis of small artery: Secondary | ICD-10-CM | POA: Diagnosis not present

## 2022-04-29 DIAGNOSIS — S299XXA Unspecified injury of thorax, initial encounter: Secondary | ICD-10-CM | POA: Diagnosis not present

## 2022-04-29 LAB — BASIC METABOLIC PANEL
Anion gap: 11 (ref 5–15)
Anion gap: 12 (ref 5–15)
BUN: 19 mg/dL (ref 8–23)
BUN: 32 mg/dL — ABNORMAL HIGH (ref 8–23)
CO2: 21 mmol/L — ABNORMAL LOW (ref 22–32)
CO2: 19 mmol/L — ABNORMAL LOW (ref 22–32)
Calcium: 9.1 mg/dL (ref 8.9–10.3)
Calcium: 9 mg/dL (ref 8.9–10.3)
Chloride: 101 mmol/L (ref 98–111)
Chloride: 102 mmol/L (ref 98–111)
Creatinine, Ser: 1.17 mg/dL (ref 0.61–1.24)
Creatinine, Ser: 1.49 mg/dL — ABNORMAL HIGH (ref 0.61–1.24)
GFR, Estimated: >60 mL/min (ref >60)
GFR, Estimated: 48 mL/min — ABNORMAL LOW (ref >60)
Glucose, Bld: 176 mg/dL — ABNORMAL HIGH (ref 70–99)
Glucose, Bld: 133 mg/dL — ABNORMAL HIGH (ref 70–99)
Potassium: 3.9 mmol/L (ref 3.5–5.1)
Potassium: 4.3 mmol/L (ref 3.5–5.1)
Sodium: 133 mmol/L — ABNORMAL LOW (ref 135–145)
Sodium: 133 mmol/L — ABNORMAL LOW (ref 135–145)

## 2022-04-29 LAB — CBC WITH DIFFERENTIAL/PLATELET
Abs Immature Granulocytes: 0.02 10*3/uL (ref 0.00–0.07)
Basophils Absolute: 0 10*3/uL (ref 0.0–0.1)
Basophils Relative: 0 %
Eosinophils Absolute: 0.1 10*3/uL (ref 0.0–0.5)
Eosinophils Relative: 1 %
HCT: 40.1 % (ref 39.0–52.0)
Hemoglobin: 13.4 g/dL (ref 13.0–17.0)
Immature Granulocytes: 0 %
Lymphocytes Relative: 10 %
Lymphs Abs: 0.5 10*3/uL — ABNORMAL LOW (ref 0.7–4.0)
MCH: 32.8 pg (ref 26.0–34.0)
MCHC: 33.4 g/dL (ref 30.0–36.0)
MCV: 98 fL (ref 80.0–100.0)
Monocytes Absolute: 0.6 10*3/uL (ref 0.1–1.0)
Monocytes Relative: 11 %
Neutro Abs: 4.2 10*3/uL (ref 1.7–7.7)
Neutrophils Relative %: 78 %
Platelets: 109 10*3/uL — ABNORMAL LOW (ref 150–400)
RBC: 4.09 MIL/uL — ABNORMAL LOW (ref 4.22–5.81)
RDW: 12.7 % (ref 11.5–15.5)
WBC: 5.4 10*3/uL (ref 4.0–10.5)
nRBC: 0 % (ref 0.0–0.2)

## 2022-04-29 LAB — GLUCOSE, CAPILLARY
Glucose-Capillary: 128 mg/dL — ABNORMAL HIGH (ref 70–99)
Glucose-Capillary: 150 mg/dL — ABNORMAL HIGH (ref 70–99)

## 2022-04-29 LAB — PROTIME-INR
INR: 1.3 — ABNORMAL HIGH (ref 0.8–1.2)
Prothrombin Time: 16 seconds — ABNORMAL HIGH (ref 11.4–15.2)

## 2022-04-29 LAB — BRAIN NATRIURETIC PEPTIDE: B Natriuretic Peptide: 314.7 pg/mL — ABNORMAL HIGH (ref 0.0–100.0)

## 2022-04-29 LAB — TROPONIN I (HIGH SENSITIVITY)
Troponin I (High Sensitivity): 1164 ng/L (ref <18)
Troponin I (High Sensitivity): 1185 ng/L (ref <18)

## 2022-04-29 MED ORDER — VALSARTAN 40 MG PO TABS: 40.0000 mg | ORAL_TABLET | Freq: Every day | ORAL | 1 refills | Status: DC

## 2022-04-29 MED ORDER — AMIODARONE HCL 200 MG PO TABS
200.0000 mg | ORAL_TABLET | Freq: Every day | ORAL | 0 refills | Status: DC
  Filled 2022-04-29: qty 30, 30d supply, fill #0

## 2022-04-29 MED ORDER — METOPROLOL SUCCINATE ER 25 MG PO TB24
25.0000 mg | ORAL_TABLET | Freq: Every day | ORAL | 0 refills | Status: DC
  Filled 2022-04-29: qty 30, 30d supply, fill #0

## 2022-04-29 MED ORDER — APIXABAN 5 MG PO TABS
5.0000 mg | ORAL_TABLET | Freq: Two times a day (BID) | ORAL | 0 refills | Status: DC
  Filled 2022-04-29: qty 60, 30d supply, fill #0

## 2022-04-29 MED ORDER — DAPAGLIFLOZIN PROPANEDIOL 10 MG PO TABS
10.0000 mg | ORAL_TABLET | Freq: Every day | ORAL | 0 refills | Status: DC
  Filled 2022-04-29: qty 30, 30d supply, fill #0

## 2022-04-29 MED ORDER — SODIUM CHLORIDE 0.9 % IV BOLUS
1000.0000 mL | Freq: Once | INTRAVENOUS | Status: AC
  Administered 2022-04-29: 1000 mL via INTRAVENOUS

## 2022-04-29 MED ORDER — SACUBITRIL-VALSARTAN 24-26 MG PO TABS
1.0000 | ORAL_TABLET | Freq: Two times a day (BID) | ORAL | 0 refills | Status: DC
  Filled 2022-04-29: qty 60, 30d supply, fill #0

## 2022-04-29 MED ORDER — AMIODARONE HCL 200 MG PO TABS
200.0000 mg | ORAL_TABLET | Freq: Two times a day (BID) | ORAL | 0 refills | Status: DC
  Filled 2022-04-29: qty 42, 30d supply, fill #0

## 2022-04-29 NOTE — Consult Note (Signed)
   [facility] CM Inpatient Consult   04/29/2022  Jose Beck [birth year] [id number]  [facility] [ACO] Patient: Medicare ACO REACH  Primary Care Provider:  [name], MD with [facility]   Patient screened for hospitalization as discussed in morning progression meeting to assess for potential [facility] Management service needs for post hospital transition for care coordination.  Review of patient's electronic medical record reveals patient is for home.     Plan:  No current hospital follow up needs noted. However, patient did decline HH or OP reahb follow up.  Of note, [facility] Care Management/Population Health does not replace or interfere with any arrangements made by the Inpatient Transition of Care team.  For questions contact:   Natividad Brood, RN BSN [place]  [phone] business mobile phone Toll free office [phone]  *[place]  [phone] Fax number: [phone] [email or web address]

## 2022-04-29 NOTE — ED Triage Notes (Signed)
Pt bib gcems as level 2 fall on thinners. Pt was d/c today for new onset afib and became dizzy while walking and was assited to ground by wife. Pt did hit head with swelling to R forehead. Denies loc. Initial pressures 70/40. Repeat pressure 90/60.

## 2022-04-29 NOTE — TOC Transition Note (Signed)
Transition of Care [facility]) - CM/SW Discharge Note   Patient Details  Name: Jose Beck MRN: [id number] Date of Birth: 1945-06-30  Transition [facility]) CM/SW Contact:  Zenon Mayo, RN Phone Number: 04/29/2022, 12:41 PM   Clinical Narrative:    Patient is for dc today, he will be on eliquis and entresto, TOC to fill meds for 30 day free coupon.  Per benefit check the eliquis co pay is 134.09 due to 37.47 deductible that has not been met.  Also the entresto copay will be 153.78 due to 37.47 deductible not met.  But he will get the first month free from [place].         Patient Goals and CMS Choice        Discharge Placement                       Discharge Plan and Services                                     Social Determinants of Health (SDOH) Interventions Housing Interventions: Other (Comment)   Readmission Risk Interventions     No data to display

## 2022-04-29 NOTE — Progress Notes (Signed)
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 Patient reported that he woke up around 4:40 AM on the day of admission with chest discomfort, burning sensation 5/10, nonradiating, not associated with nausea, palpitations or dizziness.  He took nitro x 1, with partial relief.   In ED, was noted to be in new onset atrial fibrillation with RVR, initially given Cardizem IV, due to sodium and low BP, subsequently started on IV amiodarone drip.  First set of troponin elevated at 20, however second set in 200s, prompted cardiology evaluation. Patient was also placed on IV heparin drip after CT head, evaluated by PCCM.  Patient was admitted for further workup.  Assessment and Plan: Principal Problem:   Atrial fibrillation with rapid ventricular rate (HCC) -Heart rate currently in 60s, converted to normal sinus rhythm, on amiodarone drip -Hemoglobin stable, on IV heparin drip. CHADS2-VASC score 7 -Continue metoprolol 12.5 mg twice daily -Follow further cardiology recommendations     Active problems Chest pain with NSTEMI with underlying history of CAD -Likely escalated due to demand ischemia with atrial fibrillation with RVR -Troponin trended up from 22-> 232-> 909-> 3742 -Started on IV heparin drip.  CT head obtained prior to heparin showed no acute intracranial abnormality.  Stable cerebral and cerebellar atrophy, old infarcts and chronic small vessel disease. -Echo shows LVEF 45-50% with hypokinesis with mid-to-distal  anteroseptal, apical anterior and apex hypokinesis.   -Patient declines  cardiac cath.   -He does not want to take recommended cardio-protective medications. -Wants to discuss further with Dr Martinique.    Chronic combined systolic and diastolic CHF -Currently stable, compensated, follow 2D echo   Abdominal aortic aneurysm - Followed closely by vascular surgery as outpatient.  He did have endovascular repair previously but on last visit in September 2023 there was concern for enlarging AAA (from postoperative 5.4 to 6.2 cm and endovascular leak).   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   Weight as of this encounter: 97.5 kg.   Code Status: DNR DVT Prophylaxis:  SCDs Start: 04/26/22 1353     Level of Care: Level of care: Progressive Family Communication: Updated patient's wife at the bedside     Disposition Plan:      Remains inpatient appropriate:  TRH will continue to follow the patient.  Subjective: Patient expresses some concerns about new medications which were recently added- Farxiga, Entresto and Eliquis. -He would like to speak with Dr. Martinique prior to teking a  decision.  Physical Exam: Vitals:   04/28/22 2023 04/29/22 0130 04/29/22 0713 04/29/22 1110  BP: 116/62 (!) 167/90 (!) 152/93 115/74  Pulse:  73 75 82  Resp:  '20 20 16  '$ Temp: 99.1 F (37.3 C) 98.1 F (36.7 C) 98.5 F (36.9 C) (!) 97.4 F (36.3 C)  TempSrc: Oral Oral Oral Oral  SpO2:  98% 96% 96%  Weight:  96.5 kg    Height:       General: Alert and oriented x 3, NAD Cardiovascular: S1 S2 auscultated,  RRR, 2/6 systolic murmur Respiratory: CTAB, no wheezing Gastrointestinal: Soft, nontender, nondistended, + bowel sounds Ext: no pedal edema bilaterally Neuro: residual right-sided weakness from prior stroke Psych: Normal affect    Data Reviewed:  There are no new results to review at this time.  Family Communication: Wife at bedside  Time spent: 15 minutes.  Author: Cristela Felt, MD 04/29/2022 11:40 AM  For on call review [email or web address].

## 2022-04-29 NOTE — Progress Notes (Signed)
Chaplain responded to Level 2.  Patient not in room.  Chaplain available if needed. Rev. Tamsen Snider Pager [phone]

## 2022-04-29 NOTE — ED Notes (Signed)
Patient stood just fine with little assistance. Was a little unsteady on his feet but seemed to do well. BP was 128/80 while standing and then sitting after.

## 2022-04-29 NOTE — Progress Notes (Signed)
Heart Failure Navigator Progress Note  Assessed for Heart & Vascular TOC clinic readiness.  Patient wishes to stay with Dr. Martinique. Has a hospital follow up scheduled for 05/23/2022 already. .   Navigator will sign off at this time.  Earnestine Leys, BSN, Clinical cytogeneticist Only

## 2022-04-29 NOTE — Progress Notes (Signed)
Pt being d/c, VSS, IV removed, Education complete.   Alvis Lemmings, RN 04/29/2022 12:50 PM

## 2022-04-29 NOTE — Discharge Instructions (Addendum)
You were seen in the emergency department for an episode of lightheadedness in which she fell and hit your head.  You had a CAT scan of the head that did not show any significant bleeding.  Your blood pressure was low here but improved with some fluids.  Cardiology is recommending that you hold your Entresto (sacubitril valsartan.)  We are changing this to just valsartan.  Please contact cardiology clinic for close follow-up.  Keep well-hydrated.  Return to the emergency department if any worsening or concerning symptoms.  Please use ice to your swollen area on your forehead

## 2022-04-29 NOTE — Consult Note (Signed)
Consultation Note Date: 04/29/2022   Patient Name: Jose Beck  DOB: 15-Jul-1945  MRN: [id number]  Age / Sex: 76 y.o., male  PCP: [name], MD Referring Physician: Cristela Felt, MD  Reason for Consultation:  goals of care  HPI/Patient Profile: 76 y.o. male  with past medical history of brain stem CVA with residual dysarthria, gait dysfunction, right-sided weakness, diabetes, CHF, aortic stenosis, prostate cancer with mets to the spine not currently under treatment, recent admission for [facility] admitted on 04/26/2022 with chest pain.  Workup revealed NSTEMI and A-fib with RVR.  He is now in normal sinus rhythm.  Palliative medicine consulted for goals of care  Primary Decision Maker PATIENT  Discussion: Chart reviewed including labs, progress notes, imaging from this and previous encounters.  I met at the bedside with the patient and his spouse.  Introduced palliative medicine-  Palliative medicine is specialized medical care for people living with serious illness. It focuses on providing relief from the symptoms and stress of a serious illness. The goal is to improve quality of life for both the patient and the family.  Patient has advanced directives in his chart - his spouse [name] is his dedicated POA.  He has a DO NOT RESUSCITATE in place. On admission he had goals of care discussion with admitting provider the following is from their note dated 12/16-  "Discussed at length with patient and his wife regarding tenuous medical condition and underlying major risk factors making treatment options precarious.  Both patient and wife acknowledged overall deteriorating health condition but wife hopeful that he can maintain current quality of life where he is able to still ambulate, communicate appropriately and go out with family for lunches etc.patient clearly states that he would not want any  aggressive surgical interventions/procedures but is open to IV medications/fluids/blood transfusions as needed.  He would not want to be resuscitated (discussed about CPR/shocking and ventilation options) or put on artificial breathing machines for prolonging life in case of medical emergency.  He has requested DNR status and wife aware of his wishes.  Although she is hopeful for ability to maintain quality of life, she understands his overall poor prognosis and agreeable to honoring his wishes for DNR status.  He does have a living will and health care proxy would be his wife.  Both patient and wife open to palliative care discussions.  Patient also mentioned that he may not want to undergo cardiac cath but I have advised him to discuss with cardiology further regarding this procedure." Patient's main concern today is that he is ready to discharge home.  He is asking when he will discharge and if he is not discharging today then when will he. I discussed advance care planning with patient and his spouse-noted his advanced directives on the chart.  We discussed rehospitalization if the patient were to go home and declined would he want for rehospitalization.  [name] states they are uncertain that they would want to make a decision at that time.  I offered  outpatient palliative for continued support and discussion regarding patient's goals of care and feelings about future medical care. Per patient's request I reached out to attending provider and cardiologist to inquire about discharge.  Per cardiology he is stable for discharge today.  SUMMARY OF RECOMMENDATIONS -Continue current plan of care -They declined outpatient referral for palliative at this time but did accept contact information for [facility]   Code Status/Advance Care Planning: DNR   Prognosis:   Unable to determine  Discharge Planning: Home  Primary Diagnoses: Present on Admission:   Atrial fibrillation with tachycardic ventricular rate (HCC)  AAA (abdominal aortic aneurysm) (HCC)  CAD in native artery  Cerebrovascular accident [facility])  Chest pain  Non-ST elevation (NSTEMI) myocardial infarction ([place])  Chronic combined systolic and diastolic CHF (congestive heart failure) ([place])  Essential hypertension  Hyperlipidemia LDL goal <70    Physical Exam Vitals and nursing note reviewed.  Constitutional:      General: He is not in acute distress.    Appearance: He is not ill-appearing.  Cardiovascular:     Rate and Rhythm: Normal rate.     Pulses: Normal pulses.  Pulmonary:     Effort: Pulmonary effort is normal.  Neurological:     Mental Status: He is alert and oriented to person, place, and time.     Vital Signs: BP 115/74 (BP Location: Left Arm)   Pulse 82   Temp (!) 97.4 F (36.3 C) (Oral)   Resp 16   Ht _0  (1.727 m)   Wt 96.5 kg   SpO2 96%   BMI 32.35 kg/m  Pain Scale: 0-10 POSS *See Group Information*: 1-Acceptable,Awake and alert Pain Score: 0-No pain   SpO2: SpO2: 96 % O2 Device:SpO2: 96 % O2 Flow Rate: .   IO: Intake/output summary:  Intake/Output Summary (Last 24 hours) at 04/29/2022 1220 Last data filed at 04/29/2022 0800 Gross per 24 hour  Intake 474 ml  Output 950 ml  Net -476 ml    LBM: Last BM Date : 04/27/22 Baseline Weight: Weight: 98 kg Most recent weight: Weight: 96.5 kg       Thank you for this consult. Palliative medicine will continue to follow and assist as needed.  Time Total: 80 minutes Greater than 50%  of this time was spent counseling and coordinating care related to the above assessment and plan.  Signed by: Mariana Kaufman, AGNP-C Palliative Medicine    Please contact Palliative Medicine Team phone at [phone] for questions and concerns.  For individual provider: See Shea Evans

## 2022-04-29 NOTE — Care Management Important Message (Signed)
Important Message  Patient Details  Name: Jose Beck MRN: [id number] Date of Birth: 1945-10-14   Medicare Important Message Given:  Yes     Shelda Altes 04/29/2022, 8:10 AM

## 2022-04-29 NOTE — ED Notes (Signed)
Trauma Response Nurse Documentation  Jose Beck is a 76 y.o. male arriving to [facility] ED via EMS  On Eliquis (apixaban) daily. Trauma was activated as a Level 2 based on the following trauma criteria Elderly patients > 65 with head trauma on anti-coagulation (excluding ASA). Trauma team at the bedside on patient arrival.   Patient cleared for CT by Dr. Melina Copa. Pt transported to CT with trauma response nurse present to monitor. RN remained with the patient throughout their absence from the department for clinical observation.   GCS 15.  History   Past Medical History:  Diagnosis Date   Abdominal aortic aneurysm (AAA) ([place])    Aortic stenosis    a. 11/2015 Echo: EF 55-60%, Gr1 DD, mild AS.   Chronic combined systolic and diastolic CHF (congestive heart failure) ([place])    a. 07/2015: TEE showing EF of 40% b. 11/2015: Echo w/ EF 55-60%, Grade 1 DD, mild AS.   Coronary artery disease    a. patient reports 100% stenosis of LAD and RCA by cath in 1999. b. 11/2015 Cath: LM nl, LAD 100 - fills by L->L collats from D1, LCX 60d, OM1 lalrge/nl, OM2 small/nl, RCA 156m L->R collats.EF 35-45% (55-60% by echo).   Diabetes mellitus without complication ([place]    Type II   GERD (gastroesophageal reflux disease)    Hyperlipemia    Hypertension    Hypertensive heart disease    [place]    a. 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   [place]ocyt[place] 11/2015     Past Surgical History:  Procedure Laterality Date   ABDOMINAL AORTIC ENDOVASCULAR STENT GRAFT N/A 11/25/2017   Procedure: ABDOMINAL AORTIC ENDOVASCULAR STENT GRAFT;  Surgeon: BSerafina Mitchell MD;  Location: [facility] OR;  Service: Vascular;  Laterality: N/A;   CARDIAC CATHETERIZATION     CARDIAC CATHETERIZATION N/A 11/16/2015   Procedure: Left Heart Cath and Coronary Angiography;  Surgeon: HBelva Crome MD;  [facility];  Service: Cardiovascular;  Laterality: N/A;   MOLE REMOVAL     TONSILLECTOMY       Initial  Focused Assessment (If applicable, or please see trauma documentation): Patient A&Ox4, GCS 15 Hematoma to forehead No other obvious trauma identified  CT's Completed:   CT Head   Interventions:  IV, labs CT Head  Plan for disposition:  Discharge home   Event Summary: Patient from home after falling and hitting head, recently started on eliquis. Imaging completed and reveals no traumatic injury. Will be discharged home from a trauma standpoint, awaiting medical clearance from EDP.  Bedside handoff with ED RN GShirlee Limerick    APark PopeTroxler  Trauma Response RN  Please call TRN at [phone] further assistance.

## 2022-04-29 NOTE — Discharge Summary (Signed)
Physician Discharge Summary   Patient: Jose Beck MRN: [id number] DOB: 09/11/1945  Admit date:     04/26/2022  Discharge date: 04/29/22  Discharge Physician: Manfred Shirts Joud Ingwersen   PCP: [name], MD   Recommendations at discharge:    Follow up With Cardiology and PCP at DC.  Discharge Diagnoses: Principal Problem:   Atrial fibrillation with tachycardic ventricular rate (HCC) Active Problems:   Chest pain   Non-ST elevation (NSTEMI) myocardial infarction (HCC)   Type 2 diabetes mellitus with circulatory disorder, with long-term current use of insulin (HCC)   Essential hypertension   CAD in native artery   Chronic combined systolic and diastolic CHF (congestive heart failure) (HCC)   Hyperlipidemia LDL goal <70   AAA (abdominal aortic aneurysm) (HCC)   Cerebrovascular accident ([place])   [place] (subarachnoid hemorrhage) ([place])   Frequent PVCs   Acute systolic heart failure [facility])   Hospital Course:   Assessment and Plan: Principal Problem:   Atrial fibrillation with rapid ventricular rate (HCC) -Heart rate currently in 60s, converted to normal sinus rhythm, on amiodarone drip -Hemoglobin stable, was initially on IV heparin drip. CHADS2-VASC score 7, now switched to Eliquis  -Continue metoprolol '25mg'$  daily   Active problems Chest pain with NSTEMI with underlying history of CAD -Likely escalated due to demand ischemia with atrial fibrillation with RVR -Troponin trended up from 22-> 232-> 909-> 3742 -Started on IV heparin drip.  CT head obtained prior to heparin showed no acute intracranial abnormality.  Stable cerebral and cerebellar atrophy, old infarcts and chronic small vessel disease. -Echo shows LVEF 45-50% with hypokinesis with mid-to-distal anteroseptal, apical anterior and apex hypokinesis.   -Patient declines  cardiac cath.   -Started on Farxiga, Entresto, He is hesitant on starting these new medications    Chronic combined systolic and diastolic  CHF -Currently stable, compensated.   Abdominal aortic aneurysm - Followed closely by vascular surgery as outpatient.  He did have endovascular repair previously but on last visit in September 2023 there was concern for enlarging AAA (from postoperative 5.4 to 6.2 cm and endovascular leak).   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   Weight as of this encounter: 97.5 kg.   Code Status: DNR DVT Prophylaxis:  SCDs Start: 04/26/22 1353          Consultants: Cardiology Procedures performed:   Disposition: Home Diet recommendation:  Carb modified diet DISCHARGE MEDICATION: Allergies as of 04/29/2022       Reactions   Cheese Nausea And Vomiting   Pravastatin Anxiety   Anxiety attack        Medication List     STOP taking these medications    carvedilol 25 MG tablet Commonly known as: COREG   clopidogrel  75 MG tablet Commonly known as: PLAVIX   Comirnaty syringe Generic drug: COVID-19 mRNA vaccine 2023-2024   losartan 50 MG tablet Commonly known as: COZAAR       TAKE these medications    acetaminophen 650 MG CR tablet Commonly known as: TYLENOL Take 650 mg by mouth in the morning, at noon,  and at bedtime.   amiodarone 200 MG tablet Commonly known as: PACERONE Take 1 tablet (200 mg total) by mouth 2 (two) times daily.   amiodarone 200 MG tablet Commonly known as: PACERONE Take 1 tablet (200 mg total) by mouth daily. Start taking on: May 11, 2022   apixaban 5 MG Tabs tablet Commonly known as: ELIQUIS Take 1 tablet (5 mg total) by mouth 2 (two) times daily.   atorvastatin 80 MG tablet Commonly known as: LIPITOR Take 1 tablet (80 mg total) by mouth daily at 6 PM.   calcium-vitamin D 250-100 MG-UNIT tablet Take 1 tablet by mouth daily. Takes in morning '600mg'$    dapagliflozin propanediol 10 MG Tabs tablet Commonly known as: FARXIGA Take 1 tablet (10 mg total) by mouth daily. Start taking on: April 30, 2022   insulin detemir 100 UNIT/ML injection Commonly known as: LEVEMIR Inject 10 Units into the skin daily.   metoprolol succinate 25 MG 24 hr tablet Commonly known as: TOPROL-XL Take 1 tablet (25 mg total) by mouth daily. Start taking on: April 30, 2022   nitroGLYCERIN 0.4 MG SL tablet Commonly known as: NITROSTAT Place 0.4 mg under the tongue every 5 (five) minutes as needed for chest pain.   sacubitril-valsartan 24-26 MG Commonly known as: ENTRESTO Take 1 tablet by mouth 2 (two) times daily.   tacrolimus 0.1 % ointment Commonly known as: PROTOPIC Apply 1 application topically daily as needed (facial scaling).        Follow-up Information     [name], MD Follow up.   Specialty: Internal Medicine Why: The office will call patient. Contact information: [address] [place] [address] [phone]                Discharge Exam: [name] Weights   04/27/22 0400 04/28/22 0400 04/29/22 0130  Weight: 97.5 kg 96.9 kg 96.5 kg     Condition at discharge: good  The results of significant diagnostics from this hospitalization (including imaging, microbiology, ancillary and laboratory) are listed below for reference.    Imaging Studies: ECHOCARDIOGRAM LIMITED  Result Date: 04/28/2022    ECHOCARDIOGRAM LIMITED REPORT   Patient Name:   Jose Beck Date of Exam: 04/28/2022 Medical Rec #:  [id number]           Height:       68.0 in Accession #:    [id number]          Weight:       213.6 lb Date of Birth:  Nov 27, 1945          BSA:          2.102 m Patient Age:    76 years            BP:           162/86 mmHg Patient Gender: M                   HR:           74 bpm. Exam Location:  Inpatient Procedure: Limited Echo, Limited Color Doppler and Intracardiac Opacification  Agent Indications:     CHF-Acute Systolic D92.42  History:         Patient has prior history of Echocardiogram examinations, most                  recent 04/26/2022. CHF, CAD, Stroke, Arrythmias:Atrial                  Fibrillation and Tachycardia, Signs/Symptoms:Chest Pain; Risk                  Factors:Diabetes, Hypertension and Dyslipidemia.  Sonographer:     Ronny Flurry Referring Phys:  Donato Heinz Diagnosing Phys: Eleonore Chiquito MD IMPRESSIONS  1. Left ventricular ejection fraction, by estimation, is 45 to 50%. The left ventricle has mildly decreased function. The left ventricle demonstrates regional wall motion abnormalities (see scoring diagram/findings for description). There is mild concentric left ventricular hypertrophy. Left ventricular diastolic parameters are consistent with Grade I diastolic dysfunction (impaired relaxation).  2. Right ventricular systolic function is normal. The right ventricular size is normal. Tricuspid regurgitation signal is inadequate for assessing PA pressure.  3. The mitral valve is grossly normal. Trivial mitral valve regurgitation. No evidence of mitral stenosis.  4. The aortic valve is calcified.  5. The inferior vena cava is normal in size with greater than 50% respiratory variability, suggesting right atrial pressure of 3 mmHg. Comparison(s): Changes from prior study are noted. The left  ventricular function has improved. FINDINGS  Left Ventricle: Left ventricular ejection fraction, by estimation, is 45 to 50%. The left ventricle has mildly decreased function. The left ventricle demonstrates regional wall motion abnormalities. Definity contrast agent was given IV to delineate the left ventricular endocardial borders. The left ventricular internal cavity size was normal in size. There is mild concentric left ventricular hypertrophy. Left ventricular diastolic parameters are consistent with Grade I diastolic dysfunction (impaired relaxation).  LV Wall Scoring: The mid and distal anterior septum, apical anterior segment, and apex are hypokinetic. Right Ventricle: The right ventricular size is normal. No increase in right ventricular wall thickness. Right ventricular systolic function is normal. Tricuspid regurgitation signal is inadequate for assessing PA pressure. Pericardium: There is no evidence of pericardial effusion. Mitral Valve: The mitral valve is grossly normal. Trivial mitral valve regurgitation. No evidence of mitral valve stenosis. Aortic Valve: The aortic valve is calcified. Aorta: The aortic root and ascending aorta are structurally normal, with no evidence of dilitation. Venous: The inferior vena cava is normal in size with greater than 50% respiratory variability, suggesting right atrial pressure of 3 mmHg. LEFT VENTRICLE PLAX 2D LVIDd:         4.70 cm   Diastology LVIDs:         3.30 cm   LV e' medial:    5.87 cm/s LV PW:         1.20 cm   LV E/e' medial:  11.7 LV IVS:        1.20 cm   LV e' lateral:   7.40 cm/s LVOT diam:     2.10 cm   LV E/e' lateral: 9.3 LV SV:         55 LV SV Index:   26 LVOT Area:     3.46 cm  RIGHT VENTRICLE RV S prime:     10.10 cm/s TAPSE (M-mode): 2.2 cm LEFT ATRIUM         Index LA diam:    4.90 cm 2.33 cm/m  AORTIC VALVE LVOT Vmax:   83.10  cm/s LVOT Vmean:  53.600 cm/s LVOT VTI:    0.159 m  AORTA Ao Root diam: 3.60 cm MITRAL VALVE MV Area (PHT): 3.53 cm     SHUNTS MV Decel Time: 215 msec    Systemic VTI:  0.16 m MV E velocity: 68.60 cm/s  Systemic Diam: 2.10 cm MV A velocity: 99.80 cm/s MV E/A ratio:  0.69 Eleonore Chiquito MD Electronically signed by Eleonore Chiquito MD Signature Date/Time: 04/28/2022/4:03:24 PM    Final (Updated)    ECHOCARDIOGRAM COMPLETE  Result Date: 04/26/2022    ECHOCARDIOGRAM REPORT   Patient Name:   AYOMIDE PURDY Date of Exam: 04/26/2022 Medical Rec #:  [id number]           Height:       67.0 in Accession #:    [id number]          Weight:       216.0 lb Date of Birth:  [birth year]          BSA:          2.090 m Patient Age:    37 years            BP:           108/91 mmHg Patient Gender: M                   HR:           126 bpm. Exam Location:  Inpatient Procedure: 2D Echo, Color Doppler and Cardiac Doppler Indications:    Atrial Fibrillation I48.91  History:        Patient has prior history of Echocardiogram examinations, most                 recent 12/20/2021. CHF, CAD, Stroke, Aortic Valve Disease,                 Arrythmias:Atrial Fibrillation, Signs/Symptoms:Chest Pain; Risk                 Factors:Diabetes, Hypertension, Dyslipidemia and Non-Smoker.  Sonographer:    Greer Pickerel Referring Phys: [id number] Margie Billet  Sonographer Comments: Technically difficult study due to poor echo windows. Image acquisition challenging due to patient body habitus and Image acquisition challenging due to respiratory motion. IMPRESSIONS  1. Poor acoustic windows  2. The left ventricular systolic function appears to be moderate to severely reduced. Accurate estimation of LVEF limited by tachycardia. Left ventricular endocardial border not optimally defined to evaluate regional wall motion. Left ventricular diastolic function could not be evaluated due to atrial fibrillation. Recommend Limited Echo with contrast for assessment of LVEF and RWMA after tachycardia is resolved.  3. Right ventricular systolic function was not well visualized. The  right ventricular size is not well visualized. There is mildly elevated pulmonary artery systolic pressure.  4. Left atrial size was severely dilated.  5. Right atrial size was severely dilated.  6. The mitral valve is abnormal. Mild mitral valve regurgitation. No evidence of mitral stenosis.  7. The aortic valve was not well visualized. There is severe calcifcation of the aortic valve. Non-diasgnostic aortic valve doppler.  8. The inferior vena cava is normal in size with greater than 50% respiratory variability, suggesting right atrial pressure of 3 mmHg. Comparison(s): Changes from prior study are noted. LVEF appears to be worse. FINDINGS  Left Ventricle: The left ventricle has appears to be severely reduced function. Left ventricular endocardial border not optimally defined to evaluate regional wall motion. The left  ventricular internal cavity size was normal in size. There is no left ventricular hypertrophy. Left ventricular diastolic function could not be evaluated due to atrial fibrillation. Left ventricular diastolic function could not be evaluated. Right Ventricle: The right ventricular size is not well visualized. Right vetricular wall thickness was not well visualized. Right ventricular systolic function was not well visualized. There is mildly elevated pulmonary artery systolic pressure. The tricuspid regurgitant velocity is 2.72 m/s, and with an assumed right atrial pressure of 8 mmHg, the estimated right ventricular systolic pressure is 19.5 mmHg. Left Atrium: Left atrial size was severely dilated. Right Atrium: Right atrial size was severely dilated. Pericardium: There is no evidence of pericardial effusion. Mitral Valve: The mitral valve is abnormal. There is moderate calcification of the mitral valve leaflet(s). Moderately decreased mobility of the mitral valve leaflets. Mild mitral annular calcification. Mild mitral valve regurgitation. No evidence of mitral valve stenosis. Tricuspid Valve: The  tricuspid valve is not well visualized. Tricuspid valve regurgitation is trivial. No evidence of tricuspid stenosis. Aortic Valve: The aortic valve was not well visualized. There is severe calcifcation of the aortic valve. Pulmonic Valve: The pulmonic valve was not well visualized. Pulmonic valve regurgitation is not visualized. No evidence of pulmonic stenosis. Aorta: The aortic root is normal in size and structure. Venous: The inferior vena cava is normal in size with greater than 50% respiratory variability, suggesting right atrial pressure of 3 mmHg. IAS/Shunts: The interatrial septum was not well visualized.  LEFT VENTRICLE PLAX 2D LVOT diam:     1.90 cm      Diastology LV SV:         71           LV e' medial:    9.36 cm/s LV SV Index:   34           LV E/e' medial:  10.9 LVOT Area:     2.84 cm     LV e' lateral:   14.10 cm/s                             LV E/e' lateral: 7.2  LV Volumes (MOD) LV vol d, MOD A2C: 76.5 ml LV vol d, MOD A4C: 136.0 ml LV vol s, MOD A2C: 66.3 ml LV vol s, MOD A4C: 109.0 ml LV SV MOD A2C:     10.2 ml LV SV MOD A4C:     136.0 ml LV SV MOD BP:      14.3 ml RIGHT VENTRICLE RV S prime:     10.90 cm/s TAPSE (M-mode): 1.4 cm LEFT ATRIUM            Index        RIGHT ATRIUM           Index LA Vol (A4C): 117.0 ml 55.99 ml/m  RA Area:     27.00 cm                                     RA Volume:   95.40 ml  45.65 ml/m  AORTIC VALVE LVOT Vmax:   137.90 cm/s LVOT Vmean:  90.600 cm/s LVOT VTI:    0.250 m  AORTA Ao Root diam: 3.50 cm MITRAL VALVE                TRICUSPID VALVE MV Area (PHT): 3.30 cm  TR Peak grad:   29.6 mmHg MV Decel Time: 230 msec     TR Vmax:        272.00 cm/s MV E velocity: 102.00 cm/s                             SHUNTS                             Systemic VTI:  0.25 m                             Systemic Diam: 1.90 cm Vishnu Priya Mallipeddi Electronically signed by Lorelee Cover Mallipeddi Signature Date/Time: 04/26/2022/3:47:03 PM    Final    CT Head Wo  Contrast  Result Date: 04/26/2022 CLINICAL DATA:  Acute headache. EXAM: CT HEAD WITHOUT CONTRAST TECHNIQUE: Contiguous axial images were obtained from the base of the skull through the vertex without intravenous contrast. RADIATION DOSE REDUCTION: This exam was performed according to the departmental dose-optimization program which includes automated exposure control, adjustment of the mA and/or kV according to patient size and/or use of iterative reconstruction technique. COMPARISON:  12/19/2021 FINDINGS: Brain: No evidence of intracranial hemorrhage, acute infarction, hydrocephalus, extra-axial collection, or mass lesion/mass effect. Stable moderate cerebral and cerebellar atrophy. Right cerebellar encephalomalacia is stable, likely due to old infarct. Multiple old lacunar infarcts are again seen involving the left cerebellum, left basal ganglia and left thalamus. Mild chronic small vessel disease is also stable. Vascular:  No hyperdense vessel or other acute findings. Skull: No evidence of fracture or other significant bone abnormality. Sinuses/Orbits:  No acute findings. Other: None. IMPRESSION: No acute intracranial abnormality. Stable cerebral and cerebellar atrophy, old infarcts, and chronic small vessel disease as described above. Electronically Signed   By: Marlaine Hind M.D.   On: 04/26/2022 12:34   DG Chest Portable 1 View  Result Date: 04/26/2022 CLINICAL DATA:  76 year old male with history of chest pain and shortness of breath. EXAM: PORTABLE CHEST 1 VIEW COMPARISON:  Chest x-ray 12/19/2021. FINDINGS: Lung volumes are normal. No consolidative airspace disease. No pleural effusions. No pneumothorax. No pulmonary nodule or mass noted. Pulmonary vasculature and the cardiomediastinal silhouette are within normal limits. Atherosclerosis in the thoracic aorta. IMPRESSION: 1.  No radiographic evidence of acute cardiopulmonary disease. 2. Aortic atherosclerosis. Electronically Signed   By: Vinnie Langton M.D.   On: 04/26/2022 07:25    Microbiology: Results for orders placed or performed during the hospital encounter of 04/26/22  Resp panel by RT-PCR (RSV, Flu A&B, Covid) Anterior Nasal Swab     Status: None   Collection Time: 04/26/22  8:31 AM   Specimen: Anterior Nasal Swab  Result Value Ref Range Status   SARS Coronavirus 2 by RT PCR NEGATIVE NEGATIVE Final    Comment: (NOTE) SARS-CoV-2 target nucleic acids are NOT DETECTED.  The SARS-CoV-2 RNA is generally detectable in upper respiratory specimens during the acute phase of infection. The lowest concentration of SARS-CoV-2 viral copies this assay can detect is 138 copies/mL. A negative result does not preclude SARS-Cov-2 infection and should not be used as the sole basis for treatment or other patient management decisions. A negative result may occur with  improper specimen collection/handling, submission of specimen other than nasopharyngeal swab, presence of viral mutation(s) within the areas targeted by this assay, and inadequate number of viral  copies(<138 copies/mL). A negative result must be combined with clinical observations, patient history, and epidemiological information. The expected result is Negative.  Fact Sheet for Patients:  EntrepreneurPulse.com.au  Fact Sheet for Healthcare Providers:  IncredibleEmployment.be  This test is no t yet approved or cleared by the [place] FDA and  has been authorized for detection and/or diagnosis of SARS-CoV-2 by FDA under an Emergency Use Authorization (EUA). This EUA will remain  in effect (meaning this test can be used) for the duration of the COVID-19 declaration under Section 564(b)(1) of the Act, 21 U.S.C.section 360bbb-3(b)(1), unless the authorization is terminated  or revoked sooner.       Influenza A by PCR NEGATIVE NEGATIVE Final   Influenza B by PCR NEGATIVE NEGATIVE Final    Comment: (NOTE) The Xpert Xpress  SARS-CoV-2/FLU/RSV plus assay is intended as an aid in the diagnosis of influenza from Nasopharyngeal swab specimens and should not be used as a sole basis for treatment. Nasal washings and aspirates are unacceptable for Xpert Xpress SARS-CoV-2/FLU/RSV testing.  Fact Sheet for Patients: EntrepreneurPulse.com.au  Fact Sheet for Healthcare Providers: IncredibleEmployment.be  This test is not yet approved or cleared by the [place] FDA and has been authorized for detection and/or diagnosis of SARS-CoV-2 by FDA under an Emergency Use Authorization (EUA). This EUA will remain in effect (meaning this test can be used) for the duration of the COVID-19 declaration under Section 564(b)(1) of the Act, 21 U.S.C. section 360bbb-3(b)(1), unless the authorization is terminated or revoked.     Resp Syncytial Virus by PCR NEGATIVE NEGATIVE Final    Comment: (NOTE) Fact Sheet for Patients: EntrepreneurPulse.com.au  Fact Sheet for Healthcare Providers: IncredibleEmployment.be  This test is not yet approved or cleared by the [place] FDA and has been authorized for detection and/or diagnosis of SARS-CoV-2 by FDA under an Emergency Use Authorization (EUA). This EUA will remain in effect (meaning this test can be used) for the duration of the COVID-19 declaration under Section 564(b)(1) of the Act, 21 U.S.C. section 360bbb-3(b)(1), unless the authorization is terminated or revoked.  Performed at [facility], [place] [address], [place], [place] [address]     Labs: CBC: Recent Labs  Lab 04/26/22 0730 04/26/22 1742 04/26/22 2238 04/27/22 0601 04/27/22 1031 04/28/22 0044  WBC 5.4  --   --  4.3  --  5.1  NEUTROABS 4.3  --   --   --   --   --   HGB 11.9* 12.1* 11.8* 11.7* 13.0 12.4*  HCT 36.0* 35.6* 33.9* 35.0* 39.8 37.0*  MCV 98.4  --   --  96.2  --  96.4  PLT 90*  --   --  73*  --  75*   Basic  Metabolic Panel: Recent Labs  Lab 04/26/22 0730 04/27/22 0601 04/28/22 0431 04/29/22 0953  NA 138 136 134* 133*  K 4.3 4.0 4.0 3.9  CL 108 101 100 101  CO2 '22 22 24 '$ 21*  GLUCOSE 130* 118* 128* 176*  BUN 25* '18 17 19  '$ CREATININE 1.20 1.05 1.08 1.17  CALCIUM 9.0 9.1 8.9 9.1  MG 1.9  --   --   --    Liver Function Tests: Recent Labs  Lab 04/26/22 0730  AST 21  ALT 15  ALKPHOS 40  BILITOT 0.7  PROT 6.5  ALBUMIN 3.5   CBG: Recent Labs  Lab 04/28/22 1105 04/28/22 1553 04/28/22 2111 04/29/22 0609 04/29/22 1108  GLUCAP 126* 118* 120* 128* 150*  Discharge time spent: greater than 30 minutes.  Signed: Cristela Felt, MD [facility] 04/29/2022

## 2022-04-29 NOTE — ED Provider Notes (Signed)
[facility] EM[facility]ENCY DEPARTMENT Provider Note   CSN: [id number] Arrival date & time: 04/29/22  1840     History {Add pertinent medical, surgical, social history, OB history to HPI:1} Chief Complaint  Patient presents with   Jose Beck is a 76 y.o. male.  Brought in as a level 2 trauma activation.  He was just discharged from the hospital today after being admitted for atrial fibrillation, NSTEMI and had multiple medication changes.  Reportedly was walking in the hallway when he became dizzy and felt like he was going to pass out.  Wife tried to keep him from falling but he did end up striking his head.  He denies any complaints right now.  No chest pain shortness of breath nausea vomiting.  EMS did report his blood pressure was low in the 70s and were giving him some IV fluids.  He has a history of stroke with some baseline dysarthria and right-sided weakness.  He also has a known abdominal aortic aneurysm.  The history is provided by the patient and the EMS personnel.  Fall This is a new problem. The current episode started less than 1 hour ago. The problem has been gradually improving. Pertinent negatives include no chest pain, no abdominal pain, no headaches and no shortness of breath. The symptoms are aggravated by walking. Nothing relieves the symptoms. He has tried nothing for the symptoms. The treatment provided no relief.       Home Medications Prior to Admission medications   Medication Sig Start Date End Date Taking? Authorizing Provider  acetaminophen (TYLENOL) 650 MG CR tablet Take 650 mg by mouth in the morning, at noon, and at bedtime.    [provider]  amiodarone (PACERONE) 200 MG tablet Take 1 tablet (200 mg total) by mouth 2 (two) times daily for 12 days. Then take 1 tablet ('200mg'$  total) by mouth once daily. 04/29/22   Dibia, Manfred Shirts, MD  amiodarone (PACERONE) 200 MG tablet Take 1 tablet (200 mg total) by mouth daily.  05/11/22   Dibia, Manfred Shirts, MD  apixaban (ELIQUIS) 5 MG TABS tablet Take 1 tablet (5 mg total) by mouth 2 (two) times daily. 04/29/22 05/29/22  Dibia, Manfred Shirts, MD  atorvastatin (LIPITOR) 80 MG tablet Take 1 tablet (80 mg total) by mouth daily at 6 PM. 04/25/16   Martin[place], Peter M, MD  calcium-vitamin D 250-100 MG-UNIT tablet Take 1 tablet by mouth daily. Takes in morning '600mg'$     [provider]  dapagliflozin propanediol (FARXIGA) 10 MG TABS tablet Take 1 tablet (10 mg total) by mouth daily. 04/30/22   Dibia, Manfred Shirts, MD  insulin detemir (LEVEMIR) 100 UNIT/ML injection Inject 10 Units into the skin daily.    [provider]  metoprolol succinate (TOPROL-XL) 25 MG 24 hr tablet Take 1 tablet (25 mg total) by mouth daily. 04/30/22   Dibia, Manfred Shirts, MD  nitroGLYCERIN (NITROSTAT) 0.4 MG SL tablet Place 0.4 mg under the tongue every 5 (five) minutes as needed for chest pain.    [provider]  sacubitril-valsartan (ENTRESTO) 24-26 MG Take 1 tablet by mouth 2 (two) times daily. 04/29/22 05/29/22  Dibia, Manfred Shirts, MD  tacrolimus (PROTOPIC) 0.1 % ointment Apply 1 application topically daily as needed (facial scaling).    [provider]      Allergies    Cheese and Pravastatin    Review of Systems   Review of Systems  Constitutional:  Negative for  fever.  Eyes:  Negative for visual disturbance.  Respiratory:  Negative for shortness of breath.   Cardiovascular:  Negative for chest pain.  Gastrointestinal:  Negative for abdominal pain.  Musculoskeletal:  Negative for neck pain.  Neurological:  Negative for headaches.    Physical Exam Updated Vital Signs Ht '5\' 8"'$  (1.727 m)   Wt 96.5 kg   BMI 32.35 kg/m  Physical Exam Vitals and nursing note reviewed.  Constitutional:      General: He is not in acute distress.    Appearance: Normal appearance. He is well-developed.  HENT:     Head: Normocephalic.     Comments: He has some swelling over his right eye  and some small bit of abrasion. Eyes:     Conjunctiva/sclera: Conjunctivae normal.  Cardiovascular:     Rate and Rhythm: Normal rate and regular rhythm.     Heart sounds: No murmur heard. Pulmonary:     Effort: Pulmonary effort is normal. No respiratory distress.     Breath sounds: Normal breath sounds.  Abdominal:     Palpations: Abdomen is soft.     Tenderness: There is no abdominal tenderness. There is no guarding or rebound.  Musculoskeletal:        General: No tenderness or deformity. Normal range of motion.     Cervical back: Neck supple.  Skin:    General: Skin is warm and dry.     Capillary Refill: Capillary refill takes less than 2 seconds.  Neurological:     Mental Status: He is alert. Mental status is at baseline.     Comments: Patient is awake and alert.  He is somewhat slow speech with slight slurring.  He is moving all extremities without any gross deficits.     ED Results / Procedures / Treatments   Labs (all labs ordered are listed, but only abnormal results are displayed) Labs Reviewed  BASIC METABOLIC PANEL  BRAIN NATRIURETIC PEPTIDE  CBC WITH DIFFERENTIAL/PLATELET  URINALYSIS, ROUTINE W REFLEX MICROSCOPIC  PROTIME-INR  TYPE AND SCREEN  TROPONIN I (HIGH SENSITIVITY)    EKG None  Radiology ECHOCARDIOGRAM LIMITED  Result Date: 04/28/2022    ECHOCARDIOGRAM LIMITED REPORT   Patient Name:   Jose Beck Date of Exam: 04/28/2022 Medical Rec #:  [id number]           Height:       68.0 in Accession #:    [id number]          Weight:       213.6 lb Date of Birth:  1946/02/27          BSA:          2.102 m Patient Age:    58 years            BP:           162/86 mmHg Patient Gender: M                   HR:           74 bpm. Exam Location:  Inpatient Procedure: Limited Echo, Limited Color Doppler and Intracardiac Opacification            Agent Indications:     CHF-Acute Systolic N47.09  History:         Patient has prior history of Echocardiogram examinations,  most                  recent 04/26/2022. CHF, CAD, Stroke,  Arrythmias:Atrial                  Fibrillation and Tachycardia, Signs/Symptoms:Chest Pain; Risk                  Factors:Diabetes, Hypertension and Dyslipidemia.  Sonographer:     Ronny Flurry Referring Phys:  Donato Heinz Diagnosing Phys: Eleonore Chiquito MD IMPRESSIONS  1. Left ventricular ejection fraction, by estimation, is 45 to 50%. The left ventricle has mildly decreased function. The left ventricle demonstrates regional wall motion abnormalities (see scoring diagram/findings for description). There is mild concentric left ventricular hypertrophy. Left ventricular diastolic parameters are consistent with Grade I diastolic dysfunction (impaired relaxation).  2. Right ventricular systolic function is normal. The right ventricular size is normal. Tricuspid regurgitation signal is inadequate for assessing PA pressure.  3. The mitral valve is grossly normal. Trivial mitral valve regurgitation. No evidence of mitral stenosis.  4. The aortic valve is calcified.  5. The inferior vena cava is normal in size with greater than 50% respiratory variability, suggesting right atrial pressure of 3 mmHg. Comparison(s): Changes from prior study are noted. The left ventricular function has improved. FINDINGS  Left Ventricle: Left ventricular ejection fraction, by estimation, is 45 to 50%. The left ventricle has mildly decreased function. The left ventricle demonstrates regional wall motion abnormalities. Definity contrast agent was given IV to delineate the left ventricular endocardial borders. The left ventricular internal cavity size was normal in size. There is mild concentric left ventricular hypertrophy. Left ventricular diastolic parameters are consistent with Grade I diastolic dysfunction (impaired relaxation).  LV Wall Scoring: The mid and distal anterior septum, apical anterior segment, and apex are hypokinetic. Right Ventricle: The right  ventricular size is normal. No increase in right ventricular wall thickness. Right ventricular systolic function is normal. Tricuspid regurgitation signal is inadequate for assessing PA pressure. Pericardium: There is no evidence of pericardial effusion. Mitral Valve: The mitral valve is grossly normal. Trivial mitral valve regurgitation. No evidence of mitral valve stenosis. Aortic Valve: The aortic valve is calcified. Aorta: The aortic root and ascending aorta are structurally normal, with no evidence of dilitation. Venous: The inferior vena cava is normal in size with greater than 50% respiratory variability, suggesting right atrial pressure of 3 mmHg. LEFT VENTRICLE PLAX 2D LVIDd:         4.70 cm   Diastology LVIDs:         3.30 cm   LV e' medial:    5.87 cm/s LV PW:         1.20 cm   LV E/e' medial:  11.7 LV IVS:        1.20 cm   LV e' lateral:   7.40 cm/s LVOT diam:     2.10 cm   LV E/e' lateral: 9.3 LV SV:         55 LV SV Index:   26 LVOT Area:     3.46 cm  RIGHT VENTRICLE RV S prime:     10.10 cm/s TAPSE (M-mode): 2.2 cm LEFT ATRIUM         Index LA diam:    4.90 cm 2.33 cm/m  AORTIC VALVE LVOT Vmax:   83.10 cm/s LVOT Vmean:  53.600 cm/s LVOT VTI:    0.159 m  AORTA Ao Root diam: 3.60 cm MITRAL VALVE MV Area (PHT): 3.53 cm    SHUNTS MV Decel Time: 215 msec    Systemic VTI:  0.16 m MV E velocity: 68.60 cm/s  Systemic Diam: 2.10 cm MV A velocity: 99.80 cm/s MV E/A ratio:  0.69 Eleonore Chiquito MD Electronically signed by Eleonore Chiquito MD Signature Date/Time: 04/28/2022/4:03:24 PM    Final (Updated)     Procedures Procedures  {Document cardiac monitor, telemetry assessment procedure when appropriate:1}  Medications Ordered in ED Medications - No data to display  ED Course/ Medical Decision Making/ A&P                           Medical Decision Making Amount and/or Complexity of Data Reviewed Labs: ordered. Radiology: ordered.   This patient complains of ***; this involves an extensive number  of treatment Options and is a complaint that carries with it a high risk of complications and morbidity. The differential includes ***  I ordered, reviewed and interpreted labs, which included *** I ordered medication *** and reviewed PMP when indicated. I ordered imaging studies which included *** and I independently    visualized and interpreted imaging which showed *** Additional history obtained from *** Previous records obtained and reviewed *** I consulted *** and discussed lab and imaging findings and discussed disposition.  Cardiac monitoring reviewed, *** Social determinants considered, *** Critical Interventions: ***  After the interventions stated above, I reevaluated the patient and found *** Admission and further testing considered, ***   {Document critical care time when appropriate:1} {Document review of labs and clinical decision tools ie heart score, Chads2Vasc2 etc:1}  {Document your independent review of radiology images, and any outside records:1} {Document your discussion with family members, caretakers, and with consultants:1} {Document social determinants of health affecting pt's care:1} {Document your decision making why or why not admission, treatments were needed:1} Final Clinical Impression(s) / ED Diagnoses Final diagnoses:  None    Rx / DC Orders ED Discharge Orders     None

## 2022-04-30 ENCOUNTER — Other Ambulatory Visit (HOSPITAL_COMMUNITY): Payer: Self-pay

## 2022-04-30 ENCOUNTER — Telehealth: Payer: Self-pay | Admitting: Cardiology

## 2022-04-30 DIAGNOSIS — I69351 Hemiplegia and hemiparesis following cerebral infarction affecting right dominant side: Secondary | ICD-10-CM | POA: Diagnosis not present

## 2022-04-30 DIAGNOSIS — I214 Non-ST elevation (NSTEMI) myocardial infarction: Secondary | ICD-10-CM | POA: Diagnosis not present

## 2022-04-30 DIAGNOSIS — I4891 Unspecified atrial fibrillation: Secondary | ICD-10-CM | POA: Diagnosis not present

## 2022-04-30 LAB — TYPE AND SCREEN
ABO/RH(D): O NEG
Antibody Screen: POSITIVE
DAT, IgG: NEGATIVE
Unit division: 0
Unit division: 0

## 2022-04-30 LAB — BPAM RBC
Blood Product Expiration Date: 202401052359
Blood Product Expiration Date: 202401052359
Unit Type and Rh: 9500
Unit Type and Rh: 9500

## 2022-04-30 NOTE — Telephone Encounter (Signed)
Pt sent this via Mychart to the sching pool:   From the message above.  Jose Beck went to the ER.  Had a CT on his head.  All was good.  BUT they changed  a medicine. You had him on Lorsatan .  The hospital put him of Enstreso.  The Cardiologist in [place],  because Jose Beck said he was dizzy and the EMS showed a Low BP the Cardiologist changed the Enstreso to Valsartan.  But they  did not give [place] any and   so today I gave him a Lorsatan.   Please advise me what to do. Chucky May     Thank you.  We have been home about 2 hours and he fell.  EMS felt he needed to return to the emergency room because he had a bump on the same side of his head that he got with another fall in August. [place]

## 2022-04-30 NOTE — Telephone Encounter (Signed)
Called patient wife, advised of message below.   Patient wife verbalized understanding.

## 2022-04-30 NOTE — Telephone Encounter (Signed)
Called patient, spoke with wife. Recent ED visits (in epic) they started on entresto on first ED visit, changed over to valsartan at last ED visit. (RX was sent to pharmacy last night, confirmed RX request) advised she could get it from the pharmacy today.   She also states they told her to follow up with Dr.Jordan, next visit scheduled 01/12, she did not know if Dr.Jordan felt she should be seen sooner.   Patient wife verbalized understanding, I will send note to MD/Nurse to review.   Thanks!

## 2022-05-06 ENCOUNTER — Encounter: Payer: Self-pay | Admitting: Surgery

## 2022-05-06 ENCOUNTER — Other Ambulatory Visit: Payer: Self-pay

## 2022-05-09 DIAGNOSIS — I214 Non-ST elevation (NSTEMI) myocardial infarction: Secondary | ICD-10-CM | POA: Diagnosis not present

## 2022-05-09 DIAGNOSIS — N1831 Chronic kidney disease, stage 3a: Secondary | ICD-10-CM | POA: Diagnosis not present

## 2022-05-09 DIAGNOSIS — I69351 Hemiplegia and hemiparesis following cerebral infarction affecting right dominant side: Secondary | ICD-10-CM | POA: Diagnosis not present

## 2022-05-09 DIAGNOSIS — I4891 Unspecified atrial fibrillation: Secondary | ICD-10-CM | POA: Diagnosis not present

## 2022-05-09 DIAGNOSIS — I129 Hypertensive chronic kidney disease with stage 1 through stage 4 chronic kidney disease, or unspecified chronic kidney disease: Secondary | ICD-10-CM | POA: Diagnosis not present

## 2022-05-09 DIAGNOSIS — I69959 Hemiplegia and hemiparesis following unspecified cerebrovascular disease affecting unspecified side: Secondary | ICD-10-CM | POA: Diagnosis not present

## 2022-05-09 DIAGNOSIS — E785 Hyperlipidemia, unspecified: Secondary | ICD-10-CM | POA: Diagnosis not present

## 2022-05-09 DIAGNOSIS — I2510 Atherosclerotic heart disease of native coronary artery without angina pectoris: Secondary | ICD-10-CM | POA: Diagnosis not present

## 2022-05-09 DIAGNOSIS — C61 Malignant neoplasm of prostate: Secondary | ICD-10-CM | POA: Diagnosis not present

## 2022-05-09 DIAGNOSIS — E1151 Type 2 diabetes mellitus with diabetic peripheral angiopathy without gangrene: Secondary | ICD-10-CM | POA: Diagnosis not present

## 2022-05-09 DIAGNOSIS — I714 Abdominal aortic aneurysm, without rupture, unspecified: Secondary | ICD-10-CM | POA: Diagnosis not present

## 2022-05-09 DIAGNOSIS — R42 Dizziness and giddiness: Secondary | ICD-10-CM | POA: Diagnosis not present

## 2022-05-13 NOTE — Progress Notes (Signed)
Cardiology Office Note    Date:  05/23/2022   ID:  Moo, Gravley 03/31/1946, MRN [id number]  PCP:  [name], MD  Cardiologist: Dr. Martinique   Chief Complaint  Patient presents with   Atrial Fibrillation   Congestive Heart Failure   Coronary Artery Disease     History of Present Illness:    Jose Beck is a 77 y.o. male is seen for post hospital follow up. He has a  past medical history of CAD (s/p known occlusion of LAD and RCA by cath in 1992 with cath in 2017 showing similar findings with collateral flow noted), chronic combined systolic and diastolic CHF, HTN, HLD, Type 2 DM, GERD, and prior CVA.  He had a fall with vertebral fracture in December 2018 managed conservatively. He had some increased garbled speech in March 2019 that resolved. MRI showed multiple chronic strokes but no acute changes. Prior  MRI demonstrated a 4.6 cm AAA. Seen by Dr. Trula Slade and US showed 3.6 cm AAA. CTA June showed increase size to 5.7 cm. He subsequently underwent endovascular repair on 11/25/17.  There was concern for enlarging AAA (from postoperative 5.4 to 6.2 cm and endovascular leak). This is followed by serial CT by [facility].   In June 2022 he dislocated his right shoulder. States since this was on the side of his stroke surgery was not recommended. Notes his PSA has been high.  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   In 12/2021 admitted for evaluation of fall, possible syncope. Patient had a mechanical fall, and he hit his head on the door handle. Unclear if he had a syncopal episode prior to fall. 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 As It was unclear whether patient had syncope, and as echo had normal EF, no further cardiac workup was pursued. Note that patient did  have a traumatic acute subarachnoid hemorrhage and acute subdural hematoma. Seen by neurosurgery who recommended prophylactic Keppra for seizure prophylaxis. No surgical intervention planned   He was admitted 12/16-12/18/23 with  acute chest pain. Was in AFib with RVR. His troponins were elevated to 1185. He converted to NSR on IV amiodarone and was transitioned to PO. Echo showed EF 45-50% with anterior and apical WMA. These findings were felt to be related to demand ischemia given known CAD and rapid AFib. Patient deferred cardiac cath. [place] Vasc score of 7. Recommended anticoagulation with Eliquis. CT head negative for any hemorrhage. Given high bleeding risk suggested taking DOAC alone without antiplatelet therapy. Plavix was stopped. Discussed starting on Farxiga and entresto but patient resistant to taking new meds. He was DC on losartan and [place] but later noted issues and Wilder Glade was held. Was taken off ARB when seen by PCP on 12/30. Has been complaining of feeling weak, tremulous, jittery. Can't walk on his own. Prone to falling. Is scheduled to start PT next week. Family reports BP has been well controlled.   Past Medical History:  Diagnosis Date   Abdominal aortic aneurysm (AAA) ([place])    Aortic stenosis    a. 11/2015 Echo: EF 55-60%, Gr1 DD, mild AS.   Chronic combined systolic and diastolic CHF (congestive heart failure) ([place])    a. 07/2015: TEE showing EF of 40% b. 11/2015: Echo w/ EF 55-60%, Grade 1 DD, mild AS.  Coronary artery disease    a. patient reports 100% stenosis of LAD and RCA by cath in 1999. b. 11/2015 Cath: LM nl, LAD 100 - fills by L->L collats from D1, LCX 60d, OM1 lalrge/nl, OM2 small/nl, RCA 153m L->R collats.EF 35-45% (55-60% by echo).   Diabetes mellitus without complication ([facility]    Type II   GERD (gastroesophageal reflux disease)    Hyperlipemia    Hypertension    Hypertensive heart disease    [place]    a. 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    Thrombocytopenia ([facility] 11/2015    Past Surgical History:  Procedure Laterality Date   ABDOMINAL AORTIC ENDOVASCULAR STENT GRAFT N/A 11/25/2017   Procedure: ABDOMINAL AORTIC ENDOVASCULAR STENT GRAFT;  Surgeon: BSerafina Mitchell MD;  Location: [facility];  Service: Vascular;  Laterality: N/A;   CARDIAC CATHETERIZATION     CARDIAC CATHETERIZATION N/A 11/16/2015   Procedure: Left Heart Cath and Coronary Angiography;  Surgeon: HBelva Crome MD;  [facility];  Service: Cardiovascular;  Laterality: N/A;   MOLE REMOVAL     TONSILLECTOMY      Current Medications: Outpatient Medications Prior to Visit  Medication Sig Dispense Refill   acetaminophen (TYLENOL) 650 MG CR tablet Take 650 mg by mouth in the morning, at noon, and at bedtime.     apixaban (ELIQUIS) 5 MG TABS tablet Take 1 tablet (5 mg total) by mouth 2 (two) times daily. 60 tablet 0   atorvastatin (LIPITOR) 80 MG tablet Take 1 tablet (80 mg total) by mouth daily at 6 PM. 30 tablet 6   calcium-vitamin D 250-100 MG-UNIT tablet Take 1 tablet by mouth daily. Takes in morning '600mg'$      insulin detemir (LEVEMIR) 100 UNIT/ML injection Inject 10 Units into the skin daily.     metoprolol succinate (TOPROL-XL) 25 MG 24 hr tablet Take 1 tablet (25 mg total) by mouth daily. 30 tablet 0   nitroGLYCERIN (NITROSTAT) 0.4 MG SL tablet Place 0.4 mg under the tongue every 5 (five) minutes as needed for chest pain.     tacrolimus (PROTOPIC) 0.1 % ointment Apply 1 application topically daily as needed (facial scaling).     dapagliflozin propanediol (FARXIGA) 10 MG TABS tablet Take 1 tablet (10 mg total) by mouth daily. (Patient taking differently: Take 10 mg by mouth daily.) 30 tablet 0   valsartan (DIOVAN) 40 MG tablet Take 1 tablet (40 mg total) by mouth daily. (Patient taking differently: Take 40 mg by mouth daily.) 30 tablet 1   amiodarone (PACERONE) 200 MG tablet Take 1 tablet (200 mg total) by mouth 2 (two) times daily for 12 days. Then take 1  tablet ('200mg'$  total) by mouth once daily. 42 tablet 0   amiodarone (PACERONE) 200 MG tablet Take 1 tablet (200 mg total) by mouth daily. 30 tablet 0   No facility-administered medications prior to visit.     Allergies:   Cheese and Pravastatin   Social History   Socioeconomic History   Marital status: Married    Spouse name: JBethena Roys  Number of children: 0   Years of education: College   Highest education level: Not on file  Occupational History   Occupation: Retired   Tobacco Use   Smoking status: Never   Smokeless tobacco: Never  Vaping Use   Vaping Use: Never used  Substance and Sexual Activity   Alcohol use: Yes    Alcohol/week: 1.0 standard drink of alcohol  Types: 1 Shots of liquor per week    Comment: cocktails every day   Drug use: No   Sexual activity: Not on file  Other Topics Concern   Not on file  Social History Narrative   Drinks coffee daily    Social Determinants of Health   Financial Resource Strain: Not on file  Food Insecurity: No Food Insecurity (04/26/2022)   Hunger Vital Sign    Worried About Running Out of Food in the Last Year: Never true    Ran Out of Food in the Last Year: Never true  Transportation Needs: No Transportation Needs (04/26/2022)   PRAPARE - Hydrologist (Medical): No    Lack of Transportation (Non-Medical): No  Physical Activity: Not on file  Stress: Not on file  Social Connections: Not on file     Family History:  The patient's family history includes Heart disease in his mother; Hypertension in his mother.   Review of Systems:   Please see the history of present illness.     All other systems reviewed and are otherwise negative except as noted above.   Physical Exam:    VS:  BP 114/74 (BP Location: Left Arm, Patient Position: Sitting, Cuff Size: Normal)   Pulse 80   Ht '5\' 8"'$  (1.727 m)   Wt 206 lb (93.4 kg)   SpO2 98%   BMI 31.32 kg/m    GENERAL:  Well appearing obese WM in NAD.  Walks with cane.  HEENT:  PERRL, EOMI, sclera are clear. Oropharynx is clear. NECK:  No jugular venous distention, carotid upstroke brisk and symmetric, no bruits, no thyromegaly or adenopathy LUNGS:  Clear to auscultation bilaterally CHEST:  Unremarkable HEART:  RRR,  PMI not displaced or sustained,S1 and S2 within normal limits, no S3, no S4: soft 2/6 systolic murmur LSB>> RUSB ABD:  Soft, nontender. BS +, no masses or bruits. No hepatomegaly, no splenomegaly EXT:  2 + pulses throughout, no edema, no cyanosis no clubbing SKIN:  Warm and dry.  No rashes NEURO:  Alert and oriented x 3. Cranial nerves II through XII intact. PSYCH:  Cognitively intact       Wt Readings from Last 3 Encounters:  05/23/22 206 lb (93.4 kg)  04/29/22 212 lb 11.9 oz (96.5 kg)  04/29/22 212 lb 11.9 oz (96.5 kg)     Studies/Labs Reviewed:   EKG:  EKG is not ordered today.    Recent Labs: 04/26/2022: ALT 15; Magnesium 1.9 04/27/2022: TSH 1.364 04/29/2022: B Natriuretic Peptide 314.7 05/17/2022: BUN 26; Creatinine, Ser 1.31; Hemoglobin 12.8; Platelets 94; Potassium 4.0; Sodium 136   Lipid Panel    Component Value Date/Time   CHOL 127 04/28/2022 0431   TRIG 59 04/28/2022 0431   HDL 55 04/28/2022 0431   CHOLHDL 2.3 04/28/2022 0431   VLDL 12 04/28/2022 0431   LDLCALC 60 04/28/2022 0431   Labs dated 01/21/17: cholesterol 144, triglycerides 69, HDL 54, LDL 76. Dated 10/07/17: A1c 5.2%.  Dated 11/26/17: plts 94K. Hgb 13.4. Chemistries normal. A1c 5.7% Dated 06/29/19: A1c 5.4%. Dated 07/26/19: cholesterol 139, triglycerides 55, HDL 59, LDL 69. Creatinine 1.1. plts 94K. Otherwise CMET, CBC, TSH normal.  Dated 11/07/19: A1c 5.4% Dated 08/06/20: cholesterol 132, triglycerides 44, HDL 64, LDL 59. A1c 5.7%. creatinine 1.1. Otherwise CMET and TSH normal. dated 05/20/21 CMET and TSH normal  Additional studies/ records that were reviewed today include:   Cardiac Catheterization: 11/2015 Mid RCA lesion, 100%  stenosed. Ost 1st  Diag to 1st Diag lesion, 70% stenosed. Mid LAD lesion, 100% stenosed. Dist RCA lesion, 100% stenosed. Dist Cx lesion, 60% stenosed.   Total occlusion of the proximal RCA. RCA is heavily calcified. RCA fills by collaterals from the circumflex coronary of the left system. The right coronary is large in distribution. Total occlusion of the proximal to mid LAD after the origin of the first of perforator and first diagonal. Left to left collateral supply the LAD and a second diagonal. The first diagonal contains eccentric 50-70% narrowing. Widely patent circumflex including a small ramus intermedius/first obtuse marginal, and a large branching second obtuse marginal. The distal circumflex beyond the second marginal is small and contains 50-70% narrowing. The circumflex is the source of collaterals to the distal right coronary. Mildly depressed LV function with estimated EF in the 35 to 40% range. LVEDP is mildly elevated. The inferior wall is hypokinetic.     RECOMMENDATIONS: Images were reviewed with Dr. Martinique. The plan at this time is to continue medical therapy unless symptoms progress. Aggressive risk factor modification. Heart failure therapy as indicated.  Echocardiogram: 11/2015 Study Conclusions   - Left ventricle: The cavity size was normal. Systolic function was   normal. The estimated ejection fraction was in the range of 55%   to 60%. Wall motion was normal; there were no regional wall   motion abnormalities. There was an increased relative   contribution of atrial contraction to ventricular filling.   Doppler parameters are consistent with abnormal left ventricular   relaxation (grade 1 diastolic dysfunction). - Aortic valve: Moderately calcified annulus. Trileaflet. Moderate   diffuse thickening and calcification. There was mild stenosis.   Valve area (VTI): 2.17 cm^2. Valve area (Vmax): 1.88 cm^2. Valve   area (Vmean): 1.75 cm^2.  Echo 04/28/22:  IMPRESSIONS     1. Left ventricular ejection fraction, by estimation, is 45 to 50%. The  left ventricle has mildly decreased function. The left ventricle  demonstrates regional wall motion abnormalities (see scoring  diagram/findings for description). There is mild  concentric left ventricular hypertrophy. Left ventricular diastolic  parameters are consistent with Grade I diastolic dysfunction (impaired  relaxation).   2. Right ventricular systolic function is normal. The right ventricular  size is normal. Tricuspid regurgitation signal is inadequate for assessing  PA pressure.   3. The mitral valve is grossly normal. Trivial mitral valve  regurgitation. No evidence of mitral stenosis.   4. The aortic valve is calcified.   5. The inferior vena cava is normal in size with greater than 50%  respiratory variability, suggesting right atrial pressure of 3 mmHg.   Comparison(s): Changes from prior study are noted. The left ventricular  function has improved.  Assessment:    No diagnosis found.      Plan:   In order of problems listed above:  1. CAD - the patient has known occlusion of the LAD and RCA by cath in 1992 with repeat cath in 2017 showing similar findings with collateral flow  - recent chest pain and elevated troponin in setting of AFib with RVR. Felt to likely be demand ischemia. Patient deferred cardiac cath.  - he denies any chest pain or dyspnea on exertion.  - continue BB, and statin therapy. - Plavix discontinued in setting of anticoagulation   2. Chronic Combined Systolic and Diastolic CHF - Echo in 16/1096 showed a EF 55-60% by echo at that time.  - no evidence of volume overload.  - recent EF dropped to 45-50%. Likely  rate related with Afib.  - intolerance of ARB/Entresto with worsened renal function. Will hold Wilder Glade for now.  - on Toprol XL  - plan on repeating echo in 1-2 months.   3. HTN - BP well controlled   4. HLD -  Last LDL 60.  -  Remains  on high-dose Atorvastatin '80mg'$  daily.  5. Type 2 DM -  Followed by PCP.   6. AAA -  S/p endovascular repair in July 2019. Followed by Dr Trula Slade. [place] in Feb 2022 was stable with no endoleak.  7. History of CVA.   8.  Afib with RVR. Converted to NSR on amiodarone. HIgh [place] Vasc score. Now on Eliquis.  Family is concerned about side effects of amiodarone and feels his weakness/tremor are related to this. Will discontinue amiodarone. Continue Toprol and eliquis. If Afib recurs other option would be Tikosyn. Will monitor.       Signed, Zayli Villafuerte Martinique, MD  05/23/2022 1:52 PM    [facility] [address] [place], [place] [address] Phone: [phone]

## 2022-05-17 ENCOUNTER — Emergency Department (HOSPITAL_COMMUNITY): Payer: Medicare Other

## 2022-05-17 ENCOUNTER — Other Ambulatory Visit: Payer: Self-pay

## 2022-05-17 ENCOUNTER — Emergency Department (HOSPITAL_COMMUNITY)
Admission: EM | Admit: 2022-05-17 | Discharge: 2022-05-17 | Disposition: A | Discharge status: Home / Self Care | Payer: Medicare Other | Attending: Emergency Medicine | Admitting: Emergency Medicine

## 2022-05-17 DIAGNOSIS — S61411A Laceration without foreign body of right hand, initial encounter: Secondary | ICD-10-CM | POA: Diagnosis present

## 2022-05-17 DIAGNOSIS — Z8546 Personal history of malignant neoplasm of prostate: Secondary | ICD-10-CM | POA: Insufficient documentation

## 2022-05-17 DIAGNOSIS — I10 Essential (primary) hypertension: Secondary | ICD-10-CM | POA: Diagnosis not present

## 2022-05-17 DIAGNOSIS — Z8679 Personal history of other diseases of the circulatory system: Secondary | ICD-10-CM | POA: Diagnosis not present

## 2022-05-17 DIAGNOSIS — R531 Weakness: Secondary | ICD-10-CM | POA: Insufficient documentation

## 2022-05-17 DIAGNOSIS — Z794 Long term (current) use of insulin: Secondary | ICD-10-CM | POA: Diagnosis not present

## 2022-05-17 DIAGNOSIS — W010XXA Fall on same level from slipping, tripping and stumbling without subsequent striking against object, initial encounter: Secondary | ICD-10-CM | POA: Insufficient documentation

## 2022-05-17 DIAGNOSIS — Z8673 Personal history of transient ischemic attack (TIA), and cerebral infarction without residual deficits: Secondary | ICD-10-CM | POA: Insufficient documentation

## 2022-05-17 DIAGNOSIS — Z79899 Other long term (current) drug therapy: Secondary | ICD-10-CM | POA: Insufficient documentation

## 2022-05-17 DIAGNOSIS — Z7984 Long term (current) use of oral hypoglycemic drugs: Secondary | ICD-10-CM | POA: Insufficient documentation

## 2022-05-17 DIAGNOSIS — Z7901 Long term (current) use of anticoagulants: Secondary | ICD-10-CM | POA: Diagnosis not present

## 2022-05-17 DIAGNOSIS — E119 Type 2 diabetes mellitus without complications: Secondary | ICD-10-CM | POA: Insufficient documentation

## 2022-05-17 DIAGNOSIS — W19XXXD Unspecified fall, subsequent encounter: Secondary | ICD-10-CM

## 2022-05-17 LAB — BASIC METABOLIC PANEL
Anion gap: 12 (ref 5–15)
BUN: 26 mg/dL — ABNORMAL HIGH (ref 8–23)
CO2: 23 mmol/L (ref 22–32)
Calcium: 9.1 mg/dL (ref 8.9–10.3)
Chloride: 101 mmol/L (ref 98–111)
Creatinine, Ser: 1.31 mg/dL — ABNORMAL HIGH (ref 0.61–1.24)
GFR, Estimated: 56 mL/min — ABNORMAL LOW (ref >60)
Glucose, Bld: 119 mg/dL — ABNORMAL HIGH (ref 70–99)
Potassium: 4 mmol/L (ref 3.5–5.1)
Sodium: 136 mmol/L (ref 135–145)

## 2022-05-17 LAB — CBG MONITORING, ED: Glucose-Capillary: 114 mg/dL — ABNORMAL HIGH (ref 70–99)

## 2022-05-17 LAB — CBC WITH DIFFERENTIAL/PLATELET
Abs Immature Granulocytes: 0.02 10*3/uL (ref 0.00–0.07)
Basophils Absolute: 0 10*3/uL (ref 0.0–0.1)
Basophils Relative: 0 %
Eosinophils Absolute: 0 10*3/uL (ref 0.0–0.5)
Eosinophils Relative: 0 %
HCT: 39.5 % (ref 39.0–52.0)
Hemoglobin: 12.8 g/dL — ABNORMAL LOW (ref 13.0–17.0)
Immature Granulocytes: 0 %
Lymphocytes Relative: 8 %
Lymphs Abs: 0.4 10*3/uL — ABNORMAL LOW (ref 0.7–4.0)
MCH: 32 pg (ref 26.0–34.0)
MCHC: 32.4 g/dL (ref 30.0–36.0)
MCV: 98.8 fL (ref 80.0–100.0)
Monocytes Absolute: 0.3 10*3/uL (ref 0.1–1.0)
Monocytes Relative: 7 %
Neutro Abs: 3.9 10*3/uL (ref 1.7–7.7)
Neutrophils Relative %: 85 %
Platelets: 94 10*3/uL — ABNORMAL LOW (ref 150–400)
RBC: 4 MIL/uL — ABNORMAL LOW (ref 4.22–5.81)
RDW: 13.9 % (ref 11.5–15.5)
WBC: 4.6 10*3/uL (ref 4.0–10.5)
nRBC: 0 % (ref 0.0–0.2)

## 2022-05-17 LAB — URINALYSIS, ROUTINE W REFLEX MICROSCOPIC
Bacteria, UA: NONE SEEN
Bilirubin Urine: NEGATIVE
Glucose, UA: >=500 mg/dL — AB
Hgb urine dipstick: NEGATIVE
Ketones, ur: 5 mg/dL — AB
Leukocytes,Ua: NEGATIVE
Nitrite: NEGATIVE
Protein, ur: 30 mg/dL — AB
Specific Gravity, Urine: 1.024 (ref 1.005–1.030)
pH: 5 (ref 5.0–8.0)

## 2022-05-17 LAB — TROPONIN I (HIGH SENSITIVITY): Troponin I (High Sensitivity): 13 ng/L (ref <18)

## 2022-05-17 MED ORDER — ACETAMINOPHEN 325 MG PO TABS
650.0000 mg | ORAL_TABLET | Freq: Once | ORAL | Status: AC
  Administered 2022-05-17: 650 mg via ORAL
  Filled 2022-05-17: qty 2

## 2022-05-17 NOTE — ED Notes (Signed)
Skin tear to right hand dressed and bandaged

## 2022-05-17 NOTE — ED Provider Notes (Signed)
[facility] EMERGENCY DEPARTMENT Provider Note   CSN: [id number] Arrival date & time: 05/17/22  4854     History  Chief Complaint  Patient presents with   Jose Beck Jose Beck is a 77 y.o. male. With past medical history of stroke, AAA, type 2 diabetes, hypertension, CAD who presents to the emergency department with fall.  Patient states around 5AM this morning he was ambulating to the bathroom with a walker when he fell. Limited historian but denies striking his head or LOC. He denies prodromal symptoms to his fall like dizziness, lightheadedness, shortness of breath, chest pain. He is currently denying pain. Wife, [name], is at bedside who was present for incident. She states that around 5AM the patient got up to use the restroom. She states at the egress between the bedroom and bathroom he felt to the left, striking the door frame. He did not fall to the ground. She states after that, she was helping him back to the bed when his legs were weak and he lowered himself to the ground. She denies him striking his head or losing consciousness. She states that he is on a medication that is causing him to use the bathroom frequently and seems to be tiring him out.   HPI     Home Medications Prior to Admission medications   Medication Sig Start Date End Date Taking? Authorizing Provider  acetaminophen (TYLENOL) 650 MG CR tablet Take 650 mg by mouth in the morning, at noon, and at bedtime.    [provider]  amiodarone (PACERONE) 200 MG tablet Take 1 tablet (200 mg total) by mouth 2 (two) times daily for 12 days. Then take 1 tablet ('200mg'$  total) by mouth once daily. 04/29/22   Dibia, Manfred Shirts, MD  amiodarone (PACERONE) 200 MG tablet Take 1 tablet (200 mg total) by mouth daily. 05/11/22   Dibia, Manfred Shirts, MD  apixaban (ELIQUIS) 5 MG TABS tablet Take 1 tablet (5 mg total) by mouth 2 (two) times daily. 04/29/22 05/29/22  Dibia, Manfred Shirts, MD  atorvastatin  (LIPITOR) 80 MG tablet Take 1 tablet (80 mg total) by mouth daily at 6 PM. 04/25/16   [place], Peter M, MD  calcium-vitamin D 250-100 MG-UNIT tablet Take 1 tablet by mouth daily. Takes in morning '600mg'$     [provider]  dapagliflozin propanediol (FARXIGA) 10 MG TABS tablet Take 1 tablet (10 mg total) by mouth daily. 04/30/22   Dibia, Manfred Shirts, MD  insulin detemir (LEVEMIR) 100 UNIT/ML injection Inject 10 Units into the skin daily.    [provider]  metoprolol succinate (TOPROL-XL) 25 MG 24 hr tablet Take 1 tablet (25 mg total) by mouth daily. 04/30/22   Dibia, Manfred Shirts, MD  nitroGLYCERIN (NITROSTAT) 0.4 MG SL tablet Place 0.4 mg under the tongue every 5 (five) minutes as needed for chest pain.    [provider]  tacrolimus (PROTOPIC) 0.1 % ointment Apply 1 application topically daily as needed (facial scaling).    [provider]  valsartan (DIOVAN) 40 MG tablet Take 1 tablet (40 mg total) by mouth daily. 04/29/22   Hayden Rasmussen, MD      Allergies    Cheese and Pravastatin    Review of Systems   Review of Systems  Constitutional:  Positive for fatigue.  Skin:  Positive for wound.  All other systems reviewed and are negative.   Physical Exam Updated Vital Signs BP (!) 147/77   Pulse 89  Temp 98.2 F (36.8 C) (Oral)   Resp (!) 21   SpO2 100%  Physical Exam Vitals and nursing note reviewed.  Constitutional:      General: He is not in acute distress.    Appearance: Normal appearance. He is ill-appearing.  HENT:     Head: Normocephalic.     Comments: Old bruising to the right eye     Mouth/Throat:     Mouth: Mucous membranes are moist.     Pharynx: Oropharynx is clear.  Eyes:     General: No scleral icterus.    Extraocular Movements: Extraocular movements intact.  Cardiovascular:     Rate and Rhythm: Normal rate and regular rhythm.     Pulses: Normal pulses.     Heart sounds: Murmur heard.  Pulmonary:     Effort: Pulmonary  effort is normal. No respiratory distress.     Breath sounds: Normal breath sounds. No wheezing.  Abdominal:     General: Bowel sounds are normal. There is no distension.     Palpations: Abdomen is soft.     Tenderness: There is no abdominal tenderness.  Musculoskeletal:        General: Normal range of motion.     Cervical back: Normal range of motion and neck supple. No tenderness.  Skin:    General: Skin is warm and dry.     Capillary Refill: Capillary refill takes less than 2 seconds.     Findings: Bruising present.     Comments: Skin tear to right hand   Neurological:     General: No focal deficit present.     Mental Status: He is alert and oriented to person, place, and time.     Motor: Weakness present.     Comments: Right sided residual weakness  Aphasia, chronic   Psychiatric:        Mood and Affect: Mood normal.        Behavior: Behavior normal.        Thought Content: Thought content normal.        Judgment: Judgment normal.     ED Results / Procedures / Treatments   Labs (all labs ordered are listed, but only abnormal results are displayed) Labs Reviewed  CBC WITH DIFFERENTIAL/PLATELET - Abnormal; Notable for the following components:      Result Value   RBC 4.00 (*)    Hemoglobin 12.8 (*)    Platelets 94 (*)    Lymphs Abs 0.4 (*)    All other components within normal limits  BASIC METABOLIC PANEL - Abnormal; Notable for the following components:   Glucose, Bld 119 (*)    BUN 26 (*)    Creatinine, Ser 1.31 (*)    GFR, Estimated 56 (*)    All other components within normal limits  URINALYSIS, ROUTINE W REFLEX MICROSCOPIC - Abnormal; Notable for the following components:   Glucose, UA >=500 (*)    Ketones, ur 5 (*)    Protein, ur 30 (*)    All other components within normal limits  CBG MONITORING, ED - Abnormal; Notable for the following components:   Glucose-Capillary 114 (*)    All other components within normal limits  TROPONIN I (HIGH SENSITIVITY)   TROPONIN I (HIGH SENSITIVITY)    EKG None  Radiology CT Head Wo Contrast  Result Date: 05/17/2022 CLINICAL DATA:  Pain after trauma.  Fall. EXAM: CT HEAD WITHOUT CONTRAST CT CERVICAL SPINE WITHOUT CONTRAST TECHNIQUE: Multidetector CT imaging of the head and cervical  spine was performed following the standard protocol without intravenous contrast. Multiplanar CT image reconstructions of the cervical spine were also generated. RADIATION DOSE REDUCTION: This exam was performed according to the departmental dose-optimization program which includes automated exposure control, adjustment of the mA and/or kV according to patient size and/or use of iterative reconstruction technique. COMPARISON:  CT scan of the brain and C-spine December 19, 2021. CT scan of the brain April 29, 2022. FINDINGS: CT HEAD FINDINGS Brain: No subdural, epidural, or subarachnoid hemorrhage. An infarct in the right cerebellar hemisphere is again identified. A smaller lacunar infarct in the left cerebellar hemisphere is identified. No acute abnormalities in the cerebellum, brainstem, or basal cisterns. Ventricles and sulci are mildly prominent but stable. Lacunar infarcts in the right thalamus, left thalamus, and left basal ganglia are stable. White matter changes are noted. No acute cortical ischemia or interval infarct identified. No mass, mass effect, or midline shift. Vascular: Calcified atherosclerotic changes are identified in the intracranial carotids. Skull: Sclerosis in the left frontal bone appears larger compared to December 19, 2021. The calvarium is otherwise unremarkable. Sinuses/Orbits: No acute finding. Other: None. CT CERVICAL SPINE FINDINGS Alignment: Normal. Skull base and vertebrae: No acute fracture. No primary bone lesion or focal pathologic process. Soft tissues and spinal canal: No prevertebral fluid or swelling. No visible canal hematoma. Disc levels: Calcified atherosclerotic changes of multiple levels are  identified with anterior osteophytes. Upper chest: Negative. Other: No other abnormalities. IMPRESSION: 1. No acute intracranial abnormalities. 2. No fracture or traumatic malalignment in the cervical spine. 3. Sclerosis in the left frontal bone appears larger compared to December 19, 2021. This finding is not specific. However, an enlarging sclerotic focus in the left frontal bone of a 77 year old man raises the possibility of a sclerotic metastasis. Does the patient have history of cancer? A bone scan as an outpatient may help with further evaluation. Electronically Signed   By: Dorise Bullion III M.D.   On: 05/17/2022 10:01   CT Cervical Spine Wo Contrast  Result Date: 05/17/2022 CLINICAL DATA:  Pain after trauma.  Fall. EXAM: CT HEAD WITHOUT CONTRAST CT CERVICAL SPINE WITHOUT CONTRAST TECHNIQUE: Multidetector CT imaging of the head and cervical spine was performed following the standard protocol without intravenous contrast. Multiplanar CT image reconstructions of the cervical spine were also generated. RADIATION DOSE REDUCTION: This exam was performed according to the departmental dose-optimization program which includes automated exposure control, adjustment of the mA and/or kV according to patient size and/or use of iterative reconstruction technique. COMPARISON:  CT scan of the brain and C-spine December 19, 2021. CT scan of the brain April 29, 2022. FINDINGS: CT HEAD FINDINGS Brain: No subdural, epidural, or subarachnoid hemorrhage. An infarct in the right cerebellar hemisphere is again identified. A smaller lacunar infarct in the left cerebellar hemisphere is identified. No acute abnormalities in the cerebellum, brainstem, or basal cisterns. Ventricles and sulci are mildly prominent but stable. Lacunar infarcts in the right thalamus, left thalamus, and left basal ganglia are stable. White matter changes are noted. No acute cortical ischemia or interval infarct identified. No mass, mass effect, or  midline shift. Vascular: Calcified atherosclerotic changes are identified in the intracranial carotids. Skull: Sclerosis in the left frontal bone appears larger compared to December 19, 2021. The calvarium is otherwise unremarkable. Sinuses/Orbits: No acute finding. Other: None. CT CERVICAL SPINE FINDINGS Alignment: Normal. Skull base and vertebrae: No acute fracture. No primary bone lesion or focal pathologic process. Soft tissues and spinal canal: No prevertebral  fluid or swelling. No visible canal hematoma. Disc levels: Calcified atherosclerotic changes of multiple levels are identified with anterior osteophytes. Upper chest: Negative. Other: No other abnormalities. IMPRESSION: 1. No acute intracranial abnormalities. 2. No fracture or traumatic malalignment in the cervical spine. 3. Sclerosis in the left frontal bone appears larger compared to December 19, 2021. This finding is not specific. However, an enlarging sclerotic focus in the left frontal bone of a 77 year old man raises the possibility of a sclerotic metastasis. Does the patient have history of cancer? A bone scan as an outpatient may help with further evaluation. Electronically Signed   By: Dorise Bullion III M.D.   On: 05/17/2022 10:01    Procedures Procedures    Medications Ordered in ED Medications  acetaminophen (TYLENOL) tablet 650 mg (650 mg Oral Given 05/17/22 0730)    ED Course/ Medical Decision Making/ A&P                           Medical Decision Making Initial Impression and Ddx 77 year old male who presents to the emergency department with fall. Wife at bedside helps to describe the event which sounds more like weakness. He is at baseline mental status. Chronic right sided weakness. Small right hand abrasion without other obvious external trauma.  Patient PMH that increases complexity of ED encounter:  stroke, metastatic prostate cancer, hypertension, type 2 diabetes Differential: Heart failure, stroke, mechanical fall,  electrolyte dysfunction, anemia, infection, etc.  Interpretation of Diagnostics I independent reviewed and interpreted the labs as followed: CBC with stable anemia, BMP with stable creatinine, no severe electrolyte dysfunction.  UA without UTI.  Troponin negative  - I independently visualized the following imaging with scope of interpretation limited to determining acute life threatening conditions related to emergency care: CT head and C-spine, which revealed no acute abnormalities  Patient Reassessment and Ultimate Disposition/Management 77 year old male with chronic right side weakness presents with fall.  He is overall well-appearing.  Nonseptic, nontoxic in appearance.  He is hemodynamically stable. He is alert, oriented with no new focal deficits.  Given the frequent falls and previous subarachnoid hemorrhage, obtain CT head and C-spine which were both negative. CBC without new anemia. EKG without tachycardia or bradycardia dysrhythmia.  Troponin was also negative so doubt ACS as a component of his weakness. There is no significant electrolyte dysfunction for his weakness. UA without UTI. He does get up multiple times a day to urinate per the wife.  She states that this started after him beginning [place].  She is wondering if this is causing him to be weak from the amount of activity that he is having to complete.  Additionally he is on multiple blood pressure medications which could be causing him to have a component of orthostatic hypotension and weakness.  Feel that he likely needs PCP follow-up.  He also likely needs follow-up with urology is he may have underlying BPH.  He does have known prostatic cancer.  Unclear if this is causing him to have symptoms of urgency and then not being able to go to the bathroom.  Will discharge home with urinal or 2 so that he does not have to get up every time he has the urge to urinate.  Also will have him follow-up with PCP regarding his Wilder Glade  medication, cardiology for his antihypertensives and urology regarding his prostate cancer and likely underlying BPH.  The patient and wife are agreeable to this plan.  I discussed this case  with my attending physician who cosigned this note including patient's presenting symptoms, physical exam, and planned diagnostics and interventions. Attending physician stated agreement with plan or made changes to plan which were implemented.   Attending physician assessed patient at bedside.   The patient has been appropriately medically screened and/or stabilized in the ED. I have low suspicion for any other emergent medical condition which would require further screening, evaluation or treatment in the ED or require inpatient management. At time of discharge the patient is hemodynamically stable and in no acute distress. I have discussed work-up results and diagnosis with patient and answered all questions. Patient is agreeable with discharge plan. We discussed strict return precautions for returning to the emergency department and they verbalized understanding.    Patient management required discussion with the following services or consulting groups:  None  Complexity of Problems Addressed Acute complicated illness or Injury  Additional Data Reviewed and Analyzed Further history obtained from: Further history from spouse/family member, Past medical history and medications listed in the EMR, Recent PCP notes, Care Everywhere, and Prior labs/imaging results  Patient Encounter Risk Assessment Prescriptions, SDOH impact on management, and Consideration of hospitalization  Final Clinical Impression(s) / ED Diagnoses Final diagnoses:  Fall, subsequent encounter    Rx / DC Orders ED Discharge Orders     None         Mickie Hillier, PA-C 05/17/22 1312    Pattricia Boss, MD 05/17/22 [phone]

## 2022-05-17 NOTE — ED Provider Triage Note (Signed)
Emergency Medicine Provider Triage Evaluation Note  Jose Beck , a 77 y.o. male  was evaluated in triage.  Patient presenting after a fall.  He has a history of stroke, A-fib and NSTEMI.  He says that he felt like he lost his footing and caught himself on the sink with his hands.  No thinners.  Denies head trauma. Review of Systems  Positive:  Negative:   Physical Exam  BP (!) 150/88 (BP Location: Right Arm)   Pulse 89   Temp 98.2 F (36.8 C) (Oral)   Resp 17   SpO2 97%  Gen:   Awake, no distress   Resp:  Normal effort  MSK:   Moves extremities without difficulty  Other:  Patient with bruising around the right orbit that appears old.  Also has a skin tear on dorsal surface of the right hand.  Strong radial pulse.  No other lacerations, abrasions or obvious signs of trauma on triage physical exam.  Medical Decision Making  Medically screening exam initiated at 7:07 AM.  Appropriate orders placed.  Jose Beck was informed that the remainder of the evaluation will be completed by another provider, this initial triage assessment does not replace that evaluation, and the importance of remaining in the ED until their evaluation is complete.    Per chart review patient has a history of CVA, A-fib, subarachnoid hemorrhage and NSTEMI.  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   Jose Beck, [place], PA-C 05/17/22 0730

## 2022-05-17 NOTE — Discharge Instructions (Signed)
You were seen in the emergency department today for a fall.  All of your lab work and imaging appears stable.  Please follow-up with your primary care doctor regarding the Farxiga medication you are on.  Additionally follow-up with a cardiologist on Friday regarding a blood pressure medication.  Please return to emergency department for any worsening symptoms of weakness, chest pain, shortness of breath, severe abdominal pain, strokelike symptoms.

## 2022-05-17 NOTE — ED Notes (Signed)
Patient transported to CT 

## 2022-05-17 NOTE — ED Triage Notes (Signed)
Patient arrived with EMS from home lost his balance and fell this morning , no LOC , presents with right hand skin tear , he adds increasing generalized weakness and urinary frequency for the past several days .

## 2022-05-18 ENCOUNTER — Encounter: Payer: Self-pay | Admitting: Cardiology

## 2022-05-19 ENCOUNTER — Encounter: Payer: Self-pay | Admitting: Cardiology

## 2022-05-22 ENCOUNTER — Other Ambulatory Visit: Payer: Self-pay | Admitting: Surgery

## 2022-05-22 DIAGNOSIS — I9789 Other postprocedural complications and disorders of the circulatory system, not elsewhere classified: Secondary | ICD-10-CM

## 2022-05-23 ENCOUNTER — Ambulatory Visit: Payer: Medicare Other | Attending: Cardiology | Admitting: Cardiology

## 2022-05-23 ENCOUNTER — Encounter: Payer: Self-pay | Admitting: Cardiology

## 2022-05-23 ENCOUNTER — Ambulatory Visit: Payer: Medicare Other | Admitting: Cardiology

## 2022-05-23 VITALS — BP 114/74 | HR 80 | Ht 68.0 in | Wt 206.0 lb

## 2022-05-23 DIAGNOSIS — I5042 Chronic combined systolic (congestive) and diastolic (congestive) heart failure: Secondary | ICD-10-CM

## 2022-05-23 DIAGNOSIS — I480 Paroxysmal atrial fibrillation: Secondary | ICD-10-CM

## 2022-05-23 DIAGNOSIS — E7800 Pure hypercholesterolemia, unspecified: Secondary | ICD-10-CM

## 2022-05-23 DIAGNOSIS — I2510 Atherosclerotic heart disease of native coronary artery without angina pectoris: Secondary | ICD-10-CM

## 2022-05-23 NOTE — Patient Instructions (Addendum)
Medication Instructions:  Stop Amiodarone Stop Wilder Glade Continue all other medications  *If you need a refill on your cardiac medications before your next appointment, please call your pharmacy*   Lab Work: None ordered   Testing/Procedures: None ordered   Follow-Up: At [facility], you and your health needs are our priority.  As part of our continuing mission to provide you with exceptional heart care, we have created designated Provider Care Teams.  These Care Teams include your primary Cardiologist (physician) and Advanced Practice Providers (APPs -  Physician Assistants and Nurse Practitioners) who all work together to provide you with the care you need, when you need it.  We recommend signing up for the patient portal called "MyChart".  Sign up information is provided on this After Visit Summary.  MyChart is used to connect with patients for Virtual Visits (Telemedicine).  Patients are able to view lab/test results, encounter notes, upcoming appointments, etc.  Non-urgent messages can be sent to your provider as well.   To learn more about what you can do with MyChart, go to [email or web address].    Your next appointment:  Keep appointment already scheduled 2/15  at 10:20 am    Provider: Dr.Jordan

## 2022-05-27 ENCOUNTER — Ambulatory Visit: Payer: Medicare Other | Attending: Internal Medicine

## 2022-05-27 ENCOUNTER — Other Ambulatory Visit: Payer: Self-pay | Admitting: Cardiology

## 2022-05-27 ENCOUNTER — Other Ambulatory Visit: Payer: Self-pay

## 2022-05-27 DIAGNOSIS — R2689 Other abnormalities of gait and mobility: Secondary | ICD-10-CM

## 2022-05-27 DIAGNOSIS — I69351 Hemiplegia and hemiparesis following cerebral infarction affecting right dominant side: Secondary | ICD-10-CM | POA: Diagnosis present

## 2022-05-27 DIAGNOSIS — R262 Difficulty in walking, not elsewhere classified: Secondary | ICD-10-CM | POA: Diagnosis present

## 2022-05-27 DIAGNOSIS — M6281 Muscle weakness (generalized): Secondary | ICD-10-CM | POA: Diagnosis present

## 2022-05-27 DIAGNOSIS — R2681 Unsteadiness on feet: Secondary | ICD-10-CM | POA: Insufficient documentation

## 2022-05-27 MED ORDER — APIXABAN 5 MG PO TABS: 5.0000 mg | ORAL_TABLET | Freq: Two times a day (BID) | ORAL | 0 refills | Status: DC

## 2022-05-27 MED ORDER — METOPROLOL SUCCINATE ER 25 MG PO TB24: 25.0000 mg | ORAL_TABLET | Freq: Every day | ORAL | 1 refills | Status: DC

## 2022-05-27 NOTE — Telephone Encounter (Signed)
*  STAT* If patient is at the pharmacy, call can be transferred to refill team.   1. Which medications need to be refilled? (please list name of each medication and dose if known)   apixaban (ELIQUIS) 5 MG TABS tablet     metoprolol succinate (TOPROL-XL) 25 MG 24 hr tablet   2. Which pharmacy/location (including street and city if local pharmacy) is medication to be sent to?   [id number]- [place], [place] - [id number]    3. Do they need a 30 day or 90 day supply?   apixaban (ELIQUIS) 5 MG TABS tablet    - 90  metoprolol succinate (TOPROL-XL) 25 MG 24 hr tablet - 30

## 2022-05-27 NOTE — Therapy (Signed)
OUTPATIENT PHYSICAL THERAPY NEURO EVALUATION   Patient Name: Jose Beck MRN: [id number] DOB:[birth year], 77 y.o., male Today's Date: 05/27/2022   PCP: [name], MD REFERRING PROVIDER: [name], MD  END OF SESSION:  PT End of Session - 05/27/22 1448     Visit Number 1    Number of Visits 12    Date for PT Re-Evaluation 07/22/22    Authorization Type [employer]    PT Start Time 6389    PT Stop Time 3734    PT Time Calculation (min) 45 min             Past Medical History:  Diagnosis Date   Abdominal aortic aneurysm (AAA) ([place])    Aortic stenosis    a. 11/2015 Echo: EF 55-60%, Gr1 DD, mild AS.   Chronic combined systolic and diastolic CHF (congestive heart failure) ([place])    a. 07/2015: TEE showing EF of 40% b. 11/2015: Echo w/ EF 55-60%, Grade 1 DD, mild AS.   Coronary artery disease    a. patient reports 100% stenosis of LAD and RCA by cath in 1999. b. 11/2015 Cath: LM nl, LAD 100 - fills by L->L collats from D1, LCX 60d, OM1 lalrge/nl, OM2 small/nl, RCA 147m L->R collats.EF 35-45% (55-60% by echo).   Diabetes mellitus without complication ([facility]    Type II   GERD (gastroesophageal reflux disease)    Hyperlipemia    Hypertension    Hypertensive heart disease    Stroke ([facility]    a. 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   Thrombocytopenia ([facility] 11/2015   Past Surgical History:  Procedure Laterality Date   ABDOMINAL AORTIC ENDOVASCULAR STENT GRAFT N/A 11/25/2017   Procedure: ABDOMINAL AORTIC ENDOVASCULAR STENT GRAFT;  Surgeon: BSerafina Mitchell MD;  Location: [facility];  Service: Vascular;  Laterality: N/A;   CARDIAC CATHETERIZATION     CARDIAC CATHETERIZATION N/A 11/16/2015   Procedure: Left Heart Cath and Coronary Angiography;  Surgeon: HBelva Crome MD;  [facility];  Service: Cardiovascular;  Laterality: N/A;   MOLE REMOVAL     TONSILLECTOMY     Patient Active Problem List   Diagnosis Date Noted   Acute  systolic heart failure ([facility] 04/27/2022   Atrial fibrillation with tachycardic ventricular rate ([facility] 04/26/2022   Non-ST elevation (NSTEMI) myocardial infarction ([facility] 04/26/2022   Demand ischemia 04/26/2022   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   SAH (subarachnoid hemorrhage) ([facility] 12/19/2021   Cerebrovascular accident ([facility] 01/04/2018   Compression fracture of thoracic vertebra ([facility] 01/04/2018   AAA (abdominal aortic aneurysm) ([facility] [place] 11/25/2017   History of stroke 01/15/2017   Hyperlipidemia LDL goal <70 04/18/2016   Hypertension    Hypertensive heart disease    Chest pain 11/16/2015   Type 2 diabetes mellitus with circulatory disorder, with long-term current use of insulin ([facility] 11/16/2015   Essential hypertension 11/16/2015   Thrombocytopenia ([facility] 11/16/2015   Leukopenia 11/16/2015   Hyponatremia 11/16/2015   Chronic combined systolic and diastolic CHF (congestive heart failure) ([facility] 11/16/2015   CAD in native artery    Brainstem infarct, acute ([facility] 09/17/2015   Occlusion and stenosis of vertebral artery with cerebral infarction ([facility] 09/17/2015   Obesity 09/17/2015   Ataxia, 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 Recent fall on 05/17/22  REFERRING DIAG: I63.9 (ICD-10-CM) - Cerebral infarction, unspecified I69.959 (ICD-10-CM) - Hemiplegia and hemiparesis following unspecified  cerebrovascular disease affecting unspecified side R27.0 (ICD-10-CM) - Ataxia, unspecified  THERAPY DIAG:  Difficulty in walking, not elsewhere classified  Other abnormalities of gait and mobility  Muscle weakness (generalized)  Unsteadiness on feet  Hemiplegia and hemiparesis following cerebral infarction affecting right dominant side (HCC)  Rationale for Evaluation and Treatment: Rehabilitation  SUBJECTIVE:                                                                                                                                                                                              SUBJECTIVE STATEMENT: Hx of CVA in 2017 which affected right side, recent hx of falls and striking head. Pt spouse notes that about a week ago his knees started collapsing and giving way. Denies any HA, dizziness, lightheadedness, photo/phonophobia. Recently diagnosed with A-fib. Of note, pt has diagnosed prostate CA with metastases but notes not to brain.    Pt accompanied by: significant other "[name]"  PERTINENT HISTORY: past medical history of stroke, AAA, type 2 diabetes, hypertension, CAD who presents to the emergency department with fall.  PAIN:  Are you having pain? No  PRECAUTIONS: Fall  WEIGHT BEARING RESTRICTIONS: No  FALLS: Has patient fallen in last 6 months? Yes. Number of falls 5  LIVING ENVIRONMENT: Lives with: lives with their family and lives with their spouse Lives in: House/apartment, ground floor set-up Stairs: Yes, garage stairs into house, will install rail on left side Has following equipment at home: Gilford Rile - 2 wheeled  PLOF: Independent with household mobility with device, Needs assistance with ADLs, and Needs assistance with homemaking. Mostly able to dress self, needs help with Depends. Some assist with shower transfers (4" lip)  PATIENT GOALS:   OBJECTIVE:   DIAGNOSTIC FINDINGS: CT head and C-spine which were both negative.  COGNITION: Overall cognitive status: Within functional limits for tasks assessed and has some aphasia from hx of CVA   SENSATION: Not tested notes numbness along RUE  COORDINATION: Grossly impaired RUE/RLE Inability to perform rapid alternating movements, heel to shin RLE. Unable to perform finger to nose RUE due to deficits/issues  EDEMA:    MUSCLE TONE: NT   DTRs:  NT  POSTURE: rounded shoulders and forward head  LOWER EXTREMITY ROM:     Active  Right Eval Left Eval  Hip flexion    Hip extension    Hip abduction     Hip adduction    Hip internal rotation    Hip external rotation    Knee flexion    Knee extension    Ankle dorsiflexion 10 15  Ankle plantarflexion    Ankle inversion  Ankle eversion     (Blank rows = not tested)  LOWER EXTREMITY MMT:    Grossly 4/5 BLE  BED MOBILITY:  NT  TRANSFERS: Assistive device utilized: Environmental consultant - 2 wheeled and None  Sit to stand: Complete Independence and Modified independence Stand to sit: Complete Independence and Modified independence Chair to chair: Complete Independence and Modified independence Floor:  NT  RAMP:  NT  CURB:  Level of Assistance: CGA and Min A Assistive device utilized: Environmental consultant - 2 wheeled Curb Comments:   STAIRS: Level of Assistance: Min A Stair Negotiation Technique: Step to Pattern with Bilateral Rails Number of Stairs: 5  Height of Stairs: 4-6"  Comments: right Trendelenberg very prominent with stairs and profound difficulty achieving foot clearance in descending  GAIT: Gait pattern: step to pattern and shuffling Distance walked:  Assistive device utilized: Environmental consultant - 2 wheeled Level of assistance: SBA Comments:   FUNCTIONAL TESTS:  5 times sit to stand: 16.47 sec Timed up and go (TUG): 57.43 sec 10 meter walk test: NT Berg Balance Scale: NT  PATIENT SURVEYS:  N/A     PATIENT EDUCATION: Education details: assessment findings, rationale for PT intervention Person educated: Patient Education method: Explanation Education comprehension: verbalized understanding  HOME EXERCISE PROGRAM: TBD  GOALS: Goals reviewed with patient? Yes  SHORT TERM GOALS: Target date: 06/24/2022    Patient will be independent in HEP to improve functional outcomes Baseline: Goal status: INITIAL  2.  Demo reduced risk for falls and improved BLE strength per time 15 sec 5xSTS Baseline: 16 sec Goal status: INITIAL  3.  Improve independence with stair ambulation at SBA-CGA using HR on left with or without AD to improve  safety with home entry/exit Baseline: min A Goal status: INITIAL  LONG TERM GOALS: Target date: 07/22/2022    Demo reduced risk for falls per TUG test 35 sec w/ RW Baseline: 57 sec Goal status: INITIAL  2.  Demo improved efficiency of gait per speed of 2.5 ft/sec 10 meter walk test Baseline: NT Goal status: INITIAL  3.  Improve safety with mobility per supervision performance with stair ambulation and curb negotiation Baseline: min A w/ RW Goal status: INITIAL   ASSESSMENT:  CLINICAL IMPRESSION: Patient is a 77 y.o. male who was seen today for physical therapy evaluation and treatment for hx of falling and mobility impairments. Pt demonstrates reduced functional mobility and increase need for caregiver assistance and high risk for falls per fall risk assessment measures.  Patient would benefit from PT services to reduce risk for falls and improve efficiency of mobility and general activity tolerance to improve community-level mobility for enhanced quality of life.    OBJECTIVE IMPAIRMENTS: Abnormal gait, decreased activity tolerance, decreased balance, decreased coordination, decreased knowledge of use of DME, decreased mobility, difficulty walking, decreased strength, impaired tone, and impaired UE functional use.   ACTIVITY LIMITATIONS: carrying, lifting, bending, stairs, transfers, reach over head, and locomotion level  PARTICIPATION LIMITATIONS: meal prep, cleaning, laundry, interpersonal relationship, and community activity  PERSONAL FACTORS: Age, Time since onset of injury/illness/exacerbation, and 3+ comorbidities: CVA, prostate CA, compression fx  are also affecting patient's functional outcome.   REHAB POTENTIAL: Good  CLINICAL DECISION MAKING: Evolving/moderate complexity  EVALUATION COMPLEXITY: Moderate  PLAN:  PT FREQUENCY: 1-2x/week, plan is 2x/wk x 4 wks then 1x/wk x 4 wks  PT DURATION: 8 weeks  PLANNED INTERVENTIONS: Therapeutic exercises, Therapeutic  activity, Neuromuscular re-education, Balance training, Gait training, Patient/Family education, Self Care, Joint mobilization, Stair training, Vestibular training,  Canalith repositioning, Orthotic/Fit training, DME instructions, Aquatic Therapy, Dry Needling, Spinal mobilization, Cryotherapy, Moist heat, and Manual therapy  PLAN FOR NEXT SESSION: 10 meter walk test, HEP development (stair lift/taps) single limb support activiteis   4:55 PM, 05/27/22 M. Sherlyn Lees, PT, DPT Physical Therapist- [place] Office Number: [phone]

## 2022-05-28 ENCOUNTER — Ambulatory Visit
Admission: RE | Admit: 2022-05-28 | Discharge: 2022-05-28 | Disposition: A | Discharge status: Home / Self Care | Payer: Medicare Other | Source: Ambulatory Visit | Attending: Surgery | Admitting: Surgery

## 2022-05-28 DIAGNOSIS — I9789 Other postprocedural complications and disorders of the circulatory system, not elsewhere classified: Secondary | ICD-10-CM

## 2022-05-28 NOTE — Consult Note (Signed)
Chief Complaint: Patient was seen in consultation today for evaluation of aortic endoleak  Referring Physician(s): Brabham,Vance W  History of Present Illness: Jose Beck is a 77 y.o. 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  Patient had follow-up ultrasound that was concerning for enlargement of the aneurysm and a CTA on 01/20/2022 demonstrated aneurysm sac measuring approximately 6.4 cm and presumably type II endoleak.  Patient was evaluated by Dr. Trula Slade in September 2023 and he discussed treatment of the endoleak with Interventional Radiology or continued observation.  Patient elected for continued observation and plan for follow-up CTA in 6 months.  Patient has multiple medical problems including metastatic prostate cancer that is being followed by Dr. Roslyn Smiling at [facility].  Multiple medical problems including coronary artery disease, combined systolic and diastolic CHF, hypertension, hyperlipidemia, type 2 diabetes, history of CVA and atrial fibrillation.  Patient presents with his wife.  Patient is in a wheelchair.  Patient and wife report recent fall and patient was in the emergency department on 05/17/2022. Head CT and cervical spine CT were negative for acute findings.  However, there is concern for enlarging sclerotic lesion in the skull which is likely related to the prostate cancer.  Patient's wife wanted to discuss the aortic aneurysm because she is concerned about his recent falls.  Patient has no new abdominal or GI complaints.  Patient recently saw cardiology and no significant change in medical management.  Patient is on Eliquis for his atrial fibrillation.   Past Medical History:  Diagnosis Date   Abdominal aortic aneurysm (AAA) ([place])    Aortic stenosis    a. 11/2015 Echo: EF 55-60%, Gr1 DD, mild AS.   Chronic combined systolic and diastolic CHF (congestive heart failure) ([place])    a. 07/2015: TEE showing EF  of 40% b. 11/2015: Echo w/ EF 55-60%, Grade 1 DD, mild AS.   Coronary artery disease    a. patient reports 100% stenosis of LAD and RCA by cath in 1999. b. 11/2015 Cath: LM nl, LAD 100 - fills by L->L collats from D1, LCX 60d, OM1 lalrge/nl, OM2 small/nl, RCA 140m L->R collats.EF 35-45% (55-60% by echo).   Diabetes mellitus without complication ([place]    Type II   GERD (gastroesophageal reflux disease)    Hyperlipemia    Hypertension    Hypertensive heart disease    [place]    a. 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   Thrombocytopenia ([place] 11/2015    Past Surgical History:  Procedure Laterality Date   ABDOMINAL AORTIC ENDOVASCULAR STENT GRAFT N/A 11/25/2017   Procedure: ABDOMINAL AORTIC ENDOVASCULAR STENT GRAFT;  Surgeon: BSerafina Mitchell MD;  Location: [facility] OR;  Service: Vascular;  Laterality: N/A;   CARDIAC CATHETERIZATION     CARDIAC CATHETERIZATION N/A 11/16/2015   Procedure: Left Heart Cath and Coronary Angiography;  Surgeon: HBelva Crome MD;  [facility];  Service: Cardiovascular;  Laterality: N/A;   MOLE REMOVAL     TONSILLECTOMY      Allergies: Cheese and Pravastatin  Medications: Prior to Admission medications   Medication Sig Start Date End Date Taking? Authorizing Provider  acetaminophen (TYLENOL) 650 MG CR tablet Take 650 mg by mouth in the morning, at noon, and at bedtime.    [provider]  apixaban (ELIQUIS) 5 MG TABS tablet Take 1 tablet (5 mg total) by mouth 2 (two) times daily. 05/27/22 06/26/22  JMartinique Peter  M, MD  atorvastatin (LIPITOR) 80 MG tablet Take 1 tablet (80 mg total) by mouth daily at 6 PM. 04/25/16   Martinique, Peter M, MD  calcium-vitamin D 250-100 MG-UNIT tablet Take 1 tablet by mouth daily. Takes in morning '600mg'$     [provider]  insulin detemir (LEVEMIR) 100 UNIT/ML injection Inject 10 Units into the skin daily.    [provider]  metoprolol succinate (TOPROL-XL) 25 MG 24 hr tablet Take 1 tablet  (25 mg total) by mouth daily. PLEASE KEEP SCHEDULED APPOINTMENT WITH CARDIOLOGIST 05/27/22   Martinique, Peter M, MD  nitroGLYCERIN (NITROSTAT) 0.4 MG SL tablet Place 0.4 mg under the tongue every 5 (five) minutes as needed for chest pain.    [provider]  tacrolimus (PROTOPIC) 0.1 % ointment Apply 1 application topically daily as needed (facial scaling).    [provider]     Family History  Problem Relation Age of Onset   Hypertension Mother    Heart disease Mother     Social History   Socioeconomic History   Marital status: Married    Spouse name: [name]   Number of children: 0   Years of education: College   Highest education level: Not on file  Occupational History   Occupation: Retired   Tobacco Use   Smoking status: Never   Smokeless tobacco: Never  Vaping Use   Vaping Use: Never used  Substance and Sexual Activity   Alcohol use: Yes    Alcohol/week: 1.0 standard drink of alcohol    Types: 1 Shots of liquor per week    Comment: cocktails every day   Drug use: No   Sexual activity: Not on file  Other Topics Concern   Not on file  Social History Narrative   Drinks coffee daily    Social Determinants of Health   Financial Resource Strain: Not on file  Food Insecurity: No Food Insecurity (04/26/2022)   Hunger Vital Sign    Worried About Running Out of Food in the Last Year: Never true    Ran Out of Food in the Last Year: Never true  Transportation Needs: No Transportation Needs (04/26/2022)   PRAPARE - Hydrologist (Medical): No    Lack of Transportation (Non-Medical): No  Physical Activity: Not on file  Stress: Not on file  Social Connections: Not on file    Review of Systems  Constitutional: Negative.   Respiratory: Negative.    Gastrointestinal: Negative.   Genitourinary: Negative.     Vital Signs: BP 106/66 (BP Location: Left Arm, Patient Position: Sitting, Cuff Size: Normal)   Pulse 87   Temp 98.2 F  (36.8 C) (Oral)   Wt 93.4 kg   SpO2 95% Comment: room air  BMI 31.32 kg/m     Physical Exam Constitutional:      Comments: Patient is in a wheelchair  Pulmonary:     Effort: Pulmonary effort is normal.  Abdominal:     General: There is no distension.     Palpations: Abdomen is soft. There is no mass.     Tenderness: There is no abdominal tenderness.  Neurological:     Mental Status: He is alert.        Imaging: CT Head Wo Contrast  Result Date: 05/17/2022 CLINICAL DATA:  Pain after trauma.  Fall. EXAM: CT HEAD WITHOUT CONTRAST CT CERVICAL SPINE WITHOUT CONTRAST TECHNIQUE: Multidetector CT imaging of the head and cervical spine was performed following the standard  protocol without intravenous contrast. Multiplanar CT image reconstructions of the cervical spine were also generated. RADIATION DOSE REDUCTION: This exam was performed according to the departmental dose-optimization program which includes automated exposure control, adjustment of the mA and/or kV according to patient size and/or use of iterative reconstruction technique. COMPARISON:  CT scan of the brain and C-spine December 19, 2021. CT scan of the brain April 29, 2022. FINDINGS: CT HEAD FINDINGS Brain: No subdural, epidural, or subarachnoid hemorrhage. An infarct in the right cerebellar hemisphere is again identified. A smaller lacunar infarct in the left cerebellar hemisphere is identified. No acute abnormalities in the cerebellum, brainstem, or basal cisterns. Ventricles and sulci are mildly prominent but stable. Lacunar infarcts in the right thalamus, left thalamus, and left basal ganglia are stable. White matter changes are noted. No acute cortical ischemia or interval infarct identified. No mass, mass effect, or midline shift. Vascular: Calcified atherosclerotic changes are identified in the intracranial carotids. Skull: Sclerosis in the left frontal bone appears larger compared to December 19, 2021. The calvarium is  otherwise unremarkable. Sinuses/Orbits: No acute finding. Other: None. CT CERVICAL SPINE FINDINGS Alignment: Normal. Skull base and vertebrae: No acute fracture. No primary bone lesion or focal pathologic process. Soft tissues and spinal canal: No prevertebral fluid or swelling. No visible canal hematoma. Disc levels: Calcified atherosclerotic changes of multiple levels are identified with anterior osteophytes. Upper chest: Negative. Other: No other abnormalities. IMPRESSION: 1. No acute intracranial abnormalities. 2. No fracture or traumatic malalignment in the cervical spine. 3. Sclerosis in the left frontal bone appears larger compared to December 19, 2021. This finding is not specific. However, an enlarging sclerotic focus in the left frontal bone of a 77 year old man raises the possibility of a sclerotic metastasis. Does the patient have history of cancer? A bone scan as an outpatient may help with further evaluation. Electronically Signed   By: Dorise Bullion III M.D.   On: 05/17/2022 10:01   CT Cervical Spine Wo Contrast  Result Date: 05/17/2022 CLINICAL DATA:  Pain after trauma.  Fall. EXAM: CT HEAD WITHOUT CONTRAST CT CERVICAL SPINE WITHOUT CONTRAST TECHNIQUE: Multidetector CT imaging of the head and cervical spine was performed following the standard protocol without intravenous contrast. Multiplanar CT image reconstructions of the cervical spine were also generated. RADIATION DOSE REDUCTION: This exam was performed according to the departmental dose-optimization program which includes automated exposure control, adjustment of the mA and/or kV according to patient size and/or use of iterative reconstruction technique. COMPARISON:  CT scan of the brain and C-spine December 19, 2021. CT scan of the brain April 29, 2022. FINDINGS: CT HEAD FINDINGS Brain: No subdural, epidural, or subarachnoid hemorrhage. An infarct in the right cerebellar hemisphere is again identified. A smaller lacunar infarct in the  left cerebellar hemisphere is identified. No acute abnormalities in the cerebellum, brainstem, or basal cisterns. Ventricles and sulci are mildly prominent but stable. Lacunar infarcts in the right thalamus, left thalamus, and left basal ganglia are stable. White matter changes are noted. No acute cortical ischemia or interval infarct identified. No mass, mass effect, or midline shift. Vascular: Calcified atherosclerotic changes are identified in the intracranial carotids. Skull: Sclerosis in the left frontal bone appears larger compared to December 19, 2021. The calvarium is otherwise unremarkable. Sinuses/Orbits: No acute finding. Other: None. CT CERVICAL SPINE FINDINGS Alignment: Normal. Skull base and vertebrae: No acute fracture. No primary bone lesion or focal pathologic process. Soft tissues and spinal canal: No prevertebral fluid or swelling. No visible canal  hematoma. Disc levels: Calcified atherosclerotic changes of multiple levels are identified with anterior osteophytes. Upper chest: Negative. Other: No other abnormalities. IMPRESSION: 1. No acute intracranial abnormalities. 2. No fracture or traumatic malalignment in the cervical spine. 3. Sclerosis in the left frontal bone appears larger compared to December 19, 2021. This finding is not specific. However, an enlarging sclerotic focus in the left frontal bone of a 77 year old man raises the possibility of a sclerotic metastasis. Does the patient have history of cancer? A bone scan as an outpatient may help with further evaluation. Electronically Signed   By: Dorise Bullion III M.D.   On: 05/17/2022 10:01   CT Head Wo Contrast  Result Date: 04/29/2022 CLINICAL DATA:  Dizziness, fall, right forehead swelling EXAM: CT HEAD WITHOUT CONTRAST TECHNIQUE: Contiguous axial images were obtained from the base of the skull through the vertex without intravenous contrast. RADIATION DOSE REDUCTION: This exam was performed according to the departmental  dose-optimization program which includes automated exposure control, adjustment of the mA and/or kV according to patient size and/or use of iterative reconstruction technique. COMPARISON:  04/26/2022 FINDINGS: Brain: No evidence of acute infarction, hemorrhage, hydrocephalus, extra-axial collection or mass lesion/mass effect. Subcortical white matter and periventricular small vessel ischemic changes. Old left basal ganglia lacunar infarct. Old right cerebellar infarct. Vascular: Intracranial atherosclerosis. Skull: Normal. Negative for fracture or focal lesion. Sinuses/Orbits: The visualized paranasal sinuses are essentially clear. The mastoid air cells are unopacified. Other: Small hematoma overlying the right frontal bone (series 3/image 19), new. IMPRESSION: Small hematoma overlying the right frontal bone, new. No evidence of calvarial fracture. No evidence of acute intracranial abnormality. Small vessel ischemic changes. Old left basal ganglia and right cerebellar infarcts. Electronically Signed   By: Julian Hy M.D.   On: 04/29/2022 19:29   DG Pelvis Portable  Result Date: 04/29/2022 CLINICAL DATA:  Fall/trauma EXAM: PORTABLE PELVIS 1-2 VIEWS COMPARISON:  None Available. FINDINGS: No fracture or dislocation is seen. Bilateral hip joint spaces are preserved. Visualized bony pelvis appears intact. Aorto bi-iliac stents.  Vascular calcifications. IMPRESSION: Negative. Electronically Signed   By: Julian Hy M.D.   On: 04/29/2022 19:06   DG Chest Port 1 View  Result Date: 04/29/2022 CLINICAL DATA:  Trauma, fall EXAM: PORTABLE CHEST 1 VIEW COMPARISON:  Chest x-ray 04/26/2022 FINDINGS: The heart size and mediastinal contours are within normal limits. Both lungs are clear. The visualized skeletal structures are unremarkable. IMPRESSION: No active disease. Electronically Signed   By: Ronney Asters M.D.   On: 04/29/2022 19:05    Labs:  CBC: Recent Labs    04/27/22 0601 04/27/22 1031  04/28/22 0044 04/29/22 1900 05/17/22 0718  WBC 4.3  --  5.1 5.4 4.6  HGB 11.7* 13.0 12.4* 13.4 12.8*  HCT 35.0* 39.8 37.0* 40.1 39.5  PLT 73*  --  75* 109* 94*    COAGS: Recent Labs    12/19/21 1333 04/29/22 1900  INR 1.1 1.3*    BMP: Recent Labs    04/28/22 0431 04/29/22 0953 04/29/22 1900 05/17/22 0718  NA 134* 133* 133* 136  K 4.0 3.9 4.3 4.0  CL 100 101 102 101  CO2 24 21* 19* 23  GLUCOSE 128* 176* 133* 119*  BUN 17 19 32* 26*  CALCIUM 8.9 9.1 9.0 9.1  CREATININE 1.08 1.17 1.49* 1.31*  GFRNONAA >60 >60 48* 56*    LIVER FUNCTION TESTS: Recent Labs    12/19/21 1333 04/26/22 0730  BILITOT 0.9 0.7  AST 21 21  ALT  18 15  ALKPHOS 45 40  PROT 6.8 6.5  ALBUMIN 3.9 3.5    TUMOR MARKERS: No results for input(s): "AFPTM", "CEA", "CA199", "CHROMGRNA" in the last 8760 hours.  Assessment and Plan:  77 year old with medical problems including endovascular repair of abdominal aortic aneurysm in 2019.  CTA from 2023 demonstrated an enlarging aneurysm sac and concern for a type II endoleak.  Patient was given the option of observation versus potential endovascular treatment of the endoleak.  Patient elected observation but due to recent falls, the patient and his wife wanted to discuss the endoleak.  Patient has not had imaging of the aorta since September 2023.  Explained that we need to get a follow-up CTA to assess for any change in the aneurysm sac.  We discussed his anatomy and potential endovascular treatments for type II endoleak.  After our discussion, the patient would like to continue with observation for couple more months until he is scheduled to follow-up with Dr. Trula Slade.  Recommend follow-up CTA of the abdomen and pelvis, with and without contrast, in approximately 2 months and follow-up discussion with Dr. Trula Slade.  Based on the CTA findings, we can weigh the risks and benefits of endoleak managment in the setting of his multiple medical problems.  Thank you  for this interesting consult.  I greatly enjoyed meeting Elby Blackwelder and look forward to participating in their care.  A copy of this report was sent to the requesting provider on this date.  Electronically Signed: Burman Riis 05/28/2022, 2:50 PM   I spent a total of  30 Minutes   in face to face in clinical consultation, greater than 50% of which was counseling/coordinating care for aortic endoleak.

## 2022-05-29 ENCOUNTER — Ambulatory Visit: Payer: Medicare Other

## 2022-05-29 DIAGNOSIS — R262 Difficulty in walking, not elsewhere classified: Secondary | ICD-10-CM

## 2022-05-29 DIAGNOSIS — R2681 Unsteadiness on feet: Secondary | ICD-10-CM

## 2022-05-29 DIAGNOSIS — I69351 Hemiplegia and hemiparesis following cerebral infarction affecting right dominant side: Secondary | ICD-10-CM

## 2022-05-29 DIAGNOSIS — R2689 Other abnormalities of gait and mobility: Secondary | ICD-10-CM

## 2022-05-29 DIAGNOSIS — M6281 Muscle weakness (generalized): Secondary | ICD-10-CM

## 2022-05-29 NOTE — Therapy (Signed)
OUTPATIENT PHYSICAL THERAPY NEURO TREATMENT   Patient Name: Jose Beck MRN: [id number] DOB:November 25, 1945, 77 y.o., male Today's Date: 05/29/2022   PCP: [name], MD REFERRING PROVIDER: [name], MD  END OF SESSION:  PT End of Session - 05/29/22 0926     Visit Number 2    Number of Visits 12    Date for PT Re-Evaluation 07/22/22    Authorization Type [employer] Medicare    PT Start Time 0930    PT Stop Time 1015    PT Time Calculation (min) 45 min             Past Medical History:  Diagnosis Date   Abdominal aortic aneurysm (AAA) ([place])    Aortic stenosis    a. 11/2015 Echo: EF 55-60%, Gr1 DD, mild AS.   Chronic combined systolic and diastolic CHF (congestive heart failure) ([place])    a. 07/2015: TEE showing EF of 40% b. 11/2015: Echo w/ EF 55-60%, Grade 1 DD, mild AS.   Coronary artery disease    a. patient reports 100% stenosis of LAD and RCA by cath in 1999. b. 11/2015 Cath: LM nl, LAD 100 - fills by L->L collats from D1, LCX 60d, OM1 lalrge/nl, OM2 small/nl, RCA 144m L->R collats.EF 35-45% (55-60% by echo).   Diabetes mellitus without complication ([facility]    Type II   GERD (gastroesophageal reflux disease)    Hyperlipemia    Hypertension    Hypertensive heart disease    Stroke ([facility]    a. 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   Thrombocytopenia ([facility] 11/2015   Past Surgical History:  Procedure Laterality Date   ABDOMINAL AORTIC ENDOVASCULAR STENT GRAFT N/A 11/25/2017   Procedure: ABDOMINAL AORTIC ENDOVASCULAR STENT GRAFT;  Surgeon: BSerafina Mitchell MD;  Location: [facility];  Service: Vascular;  Laterality: N/A;   CARDIAC CATHETERIZATION     CARDIAC CATHETERIZATION N/A 11/16/2015   Procedure: Left Heart Cath and Coronary Angiography;  Surgeon: HBelva Crome MD;  [facility];  Service: Cardiovascular;  Laterality: N/A;   MOLE REMOVAL     TONSILLECTOMY     Patient Active Problem List   Diagnosis Date Noted   Acute  systolic heart failure ([facility] 04/27/2022   Atrial fibrillation with tachycardic ventricular rate ([facility] 04/26/2022   Non-ST elevation (NSTEMI) myocardial infarction ([facility] 04/26/2022   Demand ischemia 04/26/2022   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   SAH (subarachnoid hemorrhage) ([facility] 12/19/2021   Cerebrovascular accident ([facility] 01/04/2018   Compression fracture of thoracic vertebra ([facility] 01/04/2018   AAA (abdominal aortic aneurysm) ([facility] [place] 11/25/2017   History of stroke 01/15/2017   Hyperlipidemia LDL goal <70 04/18/2016   Hypertension    Hypertensive heart disease    Chest pain 11/16/2015   Type 2 diabetes mellitus with circulatory disorder, with long-term current use of insulin ([facility] 11/16/2015   Essential hypertension 11/16/2015   Thrombocytopenia ([facility] 11/16/2015   Leukopenia 11/16/2015   Hyponatremia 11/16/2015   Chronic combined systolic and diastolic CHF (congestive heart failure) ([facility] 11/16/2015   CAD in native artery    Brainstem infarct, acute ([facility] 09/17/2015   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 Recent fall on 05/17/22  REFERRING DIAG: I63.9 (ICD-10-CM) - Cerebral infarction, unspecified I69.959 (ICD-10-CM) - Hemiplegia and hemiparesis following unspecified  cerebrovascular disease affecting unspecified side R27.0 (ICD-10-CM) - Ataxia, unspecified  THERAPY DIAG:  Difficulty in walking, not elsewhere classified  Other abnormalities of gait and mobility  Muscle weakness (generalized)  Unsteadiness on feet  Hemiplegia and hemiparesis following cerebral infarction affecting right dominant side (HCC)  Rationale for Evaluation and Treatment: Rehabilitation  SUBJECTIVE:                                                                                                                                                                                              SUBJECTIVE STATEMENT: Hx of CVA in 2017 which affected right side, recent hx of falls and striking head. Pt spouse notes that about a week ago his knees started collapsing and giving way. Denies any HA, dizziness, lightheadedness, photo/phonophobia. Recently diagnosed with A-fib. Of note, pt has diagnosed prostate CA with metastases but notes not to brain.    Pt accompanied by: significant other "[name]"  PERTINENT HISTORY: past medical history of stroke, AAA, type 2 diabetes, hypertension, CAD who presents to the emergency department with fall.  PAIN:  Are you having pain? No  PRECAUTIONS: Fall  WEIGHT BEARING RESTRICTIONS: No  FALLS: Has patient fallen in last 6 months? Yes. Number of falls 5  LIVING ENVIRONMENT: Lives with: lives with their family and lives with their spouse Lives in: House/apartment, ground floor set-up Stairs: Yes, garage stairs into house, will install rail on left side Has following equipment at home: Gilford Rile - 2 wheeled  PLOF: Independent with household mobility with device, Needs assistance with ADLs, and Needs assistance with homemaking. Mostly able to dress self, needs help with Depends. Some assist with shower transfers (4" lip)  PATIENT GOALS:   OBJECTIVE:   TODAY'S TREATMENT: 05/29/22 Activity Comments  10 meter walk time 1 min 22 sec = 0.48 ft sec  Foot lift 2x10 4" box For SLS   Lateral step/weight shift/reach 1x10 with low target due to R shoulder impairment--cues/facilitation of step/shift  Retrowalk/weight shift To facilitate hip extension  Postural re-ed Cues for t-spine extension and scap retraction. 1-2 finger sublux right shoulder  Gait training/pre-gait - w/ RW and trials for incr right step height -sidestepping // bars for lateral hip stability for SLS 1x 60 sec -hip abd foot slide 2x10 -hip flex to target 2x10 for step height    HOME  EXERCISE PROGRAM: Access Code: [email or web address] URL: [email or web address] Date: 05/29/2022 Prepared by: Sherlyn Lees  Exercises - Forward Step Up with Counter Support  - 1 x daily - 7 x weekly - 3 sets - 10 reps  DIAGNOSTIC FINDINGS: CT head and C-spine which were both negative.  COGNITION: Overall cognitive status: Within functional limits for tasks assessed and has some aphasia from hx of CVA   SENSATION: Not tested notes numbness along RUE  COORDINATION: Grossly impaired RUE/RLE Inability to perform rapid alternating movements, heel to shin RLE. Unable to perform finger to nose RUE due to deficits/issues  EDEMA:    MUSCLE TONE: NT   DTRs:  NT  POSTURE: rounded shoulders and forward head  LOWER EXTREMITY ROM:     Active  Right Eval Left Eval  Hip flexion    Hip extension    Hip abduction    Hip adduction    Hip internal rotation    Hip external rotation    Knee flexion    Knee extension    Ankle dorsiflexion 10 15  Ankle plantarflexion    Ankle inversion    Ankle eversion     (Blank rows = not tested)  LOWER EXTREMITY MMT:    Grossly 4/5 BLE  BED MOBILITY:  NT  TRANSFERS: Assistive device utilized: Environmental consultant - 2 wheeled and None  Sit to stand: Complete Independence and Modified independence Stand to sit: Complete Independence and Modified independence Chair to chair: Complete Independence and Modified independence Floor:  NT  RAMP:  NT  CURB:  Level of Assistance: CGA and Min A Assistive device utilized: Environmental consultant - 2 wheeled Curb Comments:   STAIRS: Level of Assistance: Min A Stair Negotiation Technique: Step to Pattern with Bilateral Rails Number of Stairs: 5  Height of Stairs: 4-6"  Comments: right Trendelenberg very prominent with stairs and profound difficulty achieving foot clearance in descending  GAIT: Gait pattern: step to pattern and shuffling Distance walked:  Assistive device utilized: Environmental consultant - 2 wheeled Level of  assistance: SBA Comments:   FUNCTIONAL TESTS:  5 times sit to stand: 16.47 sec Timed up and go (TUG): 57.43 sec 10 meter walk test: NT Berg Balance Scale: NT  PATIENT SURVEYS:  N/A     PATIENT EDUCATION: Education details: assessment findings, rationale for PT intervention Person educated: Patient Education method: Explanation Education comprehension: verbalized understanding    GOALS: Goals reviewed with patient? Yes  SHORT TERM GOALS: Target date: 06/24/2022    Patient will be independent in HEP to improve functional outcomes Baseline: Goal status: INITIAL  2.  Demo reduced risk for falls and improved BLE strength per time 15 sec 5xSTS Baseline: 16 sec Goal status: INITIAL  3.  Improve independence with stair ambulation at SBA-CGA using HR on left with or without AD to improve safety with home entry/exit Baseline: min A Goal status: INITIAL  LONG TERM GOALS: Target date: 07/22/2022    Demo reduced risk for falls per TUG test 35 sec w/ RW Baseline: 57 sec Goal status: INITIAL  2.  Demo improved efficiency of gait per speed of 2.5 ft/sec 10 meter walk test Baseline: 0.48 ft/sec Goal status: INITIAL  3.  Improve safety with mobility per supervision performance with stair ambulation and curb negotiation Baseline: min A w/ RW Goal status: INITIAL   ASSESSMENT:  CLINICAL IMPRESSION:  Difficulty with right side motor control and deficits in loading response despite adequate static strength requiring multi-modal cues to facilitate increased amplitude and RLE recruitment. Difficulty in mobility as right side demo swing and stance phase deficits as evidenced by very short step length, shuffled pattern, and decreased right foot clearance.  Initiated activities to promote larger amplitude movements and dynamic balance to reduce risk for falls. Gait speed very impaired as evidenced by speed of  0.48 ft/sec using RW  OBJECTIVE IMPAIRMENTS: Abnormal gait, decreased  activity tolerance, decreased balance, decreased coordination, decreased knowledge of use of DME, decreased mobility, difficulty walking, decreased strength, impaired tone, and impaired UE functional use.   ACTIVITY LIMITATIONS: carrying, lifting, bending, stairs, transfers, reach over head, and locomotion level  PARTICIPATION LIMITATIONS: meal prep, cleaning, laundry, interpersonal relationship, and community activity  PERSONAL FACTORS: Age, Time since onset of injury/illness/exacerbation, and 3+ comorbidities: CVA, prostate CA, compression fx  are also affecting patient's functional outcome.   REHAB POTENTIAL: Good  CLINICAL DECISION MAKING: Evolving/moderate complexity  EVALUATION COMPLEXITY: Moderate  PLAN:  PT FREQUENCY: 1-2x/week, plan is 2x/wk x 4 wks then 1x/wk x 4 wks  PT DURATION: 8 weeks  PLANNED INTERVENTIONS: Therapeutic exercises, Therapeutic activity, Neuromuscular re-education, Balance training, Gait training, Patient/Family education, Self Care, Joint mobilization, Stair training, Vestibular training, Canalith repositioning, Orthotic/Fit training, DME instructions, Aquatic Therapy, Dry Needling, Spinal mobilization, Cryotherapy, Moist heat, and Manual therapy  PLAN FOR NEXT SESSION: HEP review, continue with dynamic balance, static LE balance/stability, and step height for gait   9:27 AM, 05/29/22 M. Sherlyn Lees, PT, DPT Physical Therapist- [place] Office Number: [phone]

## 2022-06-02 ENCOUNTER — Ambulatory Visit: Payer: Medicare Other

## 2022-06-02 DIAGNOSIS — M6281 Muscle weakness (generalized): Secondary | ICD-10-CM

## 2022-06-02 DIAGNOSIS — R262 Difficulty in walking, not elsewhere classified: Secondary | ICD-10-CM

## 2022-06-02 DIAGNOSIS — R2681 Unsteadiness on feet: Secondary | ICD-10-CM

## 2022-06-02 DIAGNOSIS — R2689 Other abnormalities of gait and mobility: Secondary | ICD-10-CM

## 2022-06-02 DIAGNOSIS — I69351 Hemiplegia and hemiparesis following cerebral infarction affecting right dominant side: Secondary | ICD-10-CM

## 2022-06-02 NOTE — Therapy (Signed)
OUTPATIENT PHYSICAL THERAPY NEURO TREATMENT   Patient Name: Jose Beck MRN: [id number] DOB:11-25-1945, 77 y.o., male Today's Date: 06/02/2022   PCP: [name], MD REFERRING PROVIDER: [name], MD  END OF SESSION:  PT End of Session - 06/02/22 1532     Visit Number 3    Number of Visits 12    Date for PT Re-Evaluation 07/22/22    Authorization Type [employer]    PT Start Time 5361    PT Stop Time 4431    PT Time Calculation (min) 45 min             Past Medical History:  Diagnosis Date   Abdominal aortic aneurysm (AAA) ([place])    Aortic stenosis    a. 11/2015 Echo: EF 55-60%, Gr1 DD, mild AS.   Chronic combined systolic and diastolic CHF (congestive heart failure) ([place])    a. 07/2015: TEE showing EF of 40% b. 11/2015: Echo w/ EF 55-60%, Grade 1 DD, mild AS.   Coronary artery disease    a. patient reports 100% stenosis of LAD and RCA by cath in 1999. b. 11/2015 Cath: LM nl, LAD 100 - fills by L->L collats from D1, LCX 60d, OM1 lalrge/nl, OM2 small/nl, RCA 131m L->R collats.EF 35-45% (55-60% by echo).   Diabetes mellitus without complication ([facility]    Type II   GERD (gastroesophageal reflux disease)    Hyperlipemia    Hypertension    Hypertensive heart disease    Stroke ([place]    a. 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   Thrombocytopenia ([facility] 11/2015   Past Surgical History:  Procedure Laterality Date   ABDOMINAL AORTIC ENDOVASCULAR STENT GRAFT N/A 11/25/2017   Procedure: ABDOMINAL AORTIC ENDOVASCULAR STENT GRAFT;  Surgeon: BSerafina Mitchell MD;  [facility];  Service: Vascular;  Laterality: N/A;   CARDIAC CATHETERIZATION     CARDIAC CATHETERIZATION N/A 11/16/2015   Procedure: Left Heart Cath and Coronary Angiography;  Surgeon: HBelva Crome MD;  [facility];  Service: Cardiovascular;  Laterality: N/A;   MOLE REMOVAL     TONSILLECTOMY     Patient Active Problem List   Diagnosis Date Noted   Acute  systolic heart failure (HG[place] 04/27/2022   Atrial fibrillation with tachycardic ventricular rate ([facility] 04/26/2022   Non-ST elevation (NSTEMI) myocardial infarction (HA[facility] 04/26/2022   Demand ischemia 04/26/2022   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   SAH (subarachnoid hemorrhage) ([facility] 12/19/2021   Cerebrovascular accident ([facility] 01/04/2018   Compression fracture of thoracic vertebra (HH[place] 01/04/2018   AAA (abdominal aortic aneurysm) (HB[facility] 11/25/2017   History of stroke 01/15/2017   Hyperlipidemia LDL goal <70 04/18/2016   Hypertension    Hypertensive heart disease    Chest pain 11/16/2015   Type 2 diabetes mellitus with circulatory disorder, with long-term current use of insulin ([facility] 11/16/2015   Essential hypertension 11/16/2015   Thrombocytopenia ([facility] 11/16/2015   Leukopenia 11/16/2015   Hyponatremia 11/16/2015   Chronic combined systolic and diastolic CHF (congestive heart failure) ([facility] 11/16/2015   CAD in native artery    Brainstem infarct, acute (H[place] 09/17/2015   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 Recent fall on 05/17/22  REFERRING DIAG: I63.9 (ICD-10-CM) - Cerebral infarction, unspecified I69.959 (ICD-10-CM) - Hemiplegia and hemiparesis following unspecified  cerebrovascular disease affecting unspecified side R27.0 (ICD-10-CM) - Ataxia, unspecified  THERAPY DIAG:  Difficulty in walking, not elsewhere classified  Other abnormalities of gait and mobility  Muscle weakness (generalized)  Unsteadiness on feet  Hemiplegia and hemiparesis following cerebral infarction affecting right dominant side (HCC)  Rationale for Evaluation and Treatment: Rehabilitation  SUBJECTIVE:                                                                                                                                                                                              SUBJECTIVE STATEMENT: Had to lower self to ground in shower this AM due LOB with stepping over lip into shower.   Pt accompanied by: significant other "[name]"  PERTINENT HISTORY: past medical history of stroke, AAA, type 2 diabetes, hypertension, CAD who presents to the emergency department with fall.  PAIN:  Are you having pain? No  PRECAUTIONS: Fall  WEIGHT BEARING RESTRICTIONS: No  FALLS: Has patient fallen in last 6 months? Yes. Number of falls 5  LIVING ENVIRONMENT: Lives with: lives with their family and lives with their spouse Lives in: House/apartment, ground floor set-up Stairs: Yes, garage stairs into house, will install rail on left side Has following equipment at home: Gilford Rile - 2 wheeled  PLOF: Independent with household mobility with device, Needs assistance with ADLs, and Needs assistance with homemaking. Mostly able to dress self, needs help with Depends. Some assist with shower transfers (4" lip)  PATIENT GOALS:   OBJECTIVE:   TODAY'S TREATMENT: 06/02/22 Activity Comments  Seated LE AROM 2x10 For coordination/warm up  Step taps 3x10 Sink cabinet 10 reps ea LE vs alternating  Sidestepping x 2 min Along counter  Mini-squats 1x10, 1x5 at counter  balance -feet together: EO/EC x 30 sec. Head turns EO/EC 3x-- difficulty with maintaining eyes closed -standing on foam: same conditions        TODAY'S TREATMENT: 05/29/22 Activity Comments  10 meter walk time 1 min 22 sec = 0.48 ft sec  Foot lift 2x10 4" box For SLS   Lateral step/weight shift/reach 1x10 with low target due to R shoulder impairment--cues/facilitation of step/shift  Retrowalk/weight shift To facilitate hip extension  Postural re-ed Cues for t-spine extension and scap retraction. 1-2 finger sublux right shoulder  Gait training/pre-gait - w/ RW and trials for incr right step height -sidestepping // bars  for lateral hip stability for SLS 1x 60 sec -hip abd foot slide 2x10 -hip flex to target 2x10 for step height    HOME EXERCISE PROGRAM: Access Code: [email or web address] URL: [email or web address] Date: 05/29/2022 Prepared by: Sherlyn Lees  Exercises - Forward Step Up  with Counter Support  - 1 x daily - 7 x weekly - 3 sets - 10 reps  DIAGNOSTIC FINDINGS: CT head and C-spine which were both negative.  COGNITION: Overall cognitive status: Within functional limits for tasks assessed and has some aphasia from hx of CVA   SENSATION: Not tested notes numbness along RUE  COORDINATION: Grossly impaired RUE/RLE Inability to perform rapid alternating movements, heel to shin RLE. Unable to perform finger to nose RUE due to deficits/issues  EDEMA:    MUSCLE TONE: NT   DTRs:  NT  POSTURE: rounded shoulders and forward head  LOWER EXTREMITY ROM:     Active  Right Eval Left Eval  Hip flexion    Hip extension    Hip abduction    Hip adduction    Hip internal rotation    Hip external rotation    Knee flexion    Knee extension    Ankle dorsiflexion 10 15  Ankle plantarflexion    Ankle inversion    Ankle eversion     (Blank rows = not tested)  LOWER EXTREMITY MMT:    Grossly 4/5 BLE  BED MOBILITY:  NT  TRANSFERS: Assistive device utilized: Environmental consultant - 2 wheeled and None  Sit to stand: Complete Independence and Modified independence Stand to sit: Complete Independence and Modified independence Chair to chair: Complete Independence and Modified independence Floor:  NT  RAMP:  NT  CURB:  Level of Assistance: CGA and Min A Assistive device utilized: Environmental consultant - 2 wheeled Curb Comments:   STAIRS: Level of Assistance: Min A Stair Negotiation Technique: Step to Pattern with Bilateral Rails Number of Stairs: 5  Height of Stairs: 4-6"  Comments: right Trendelenberg very prominent with stairs and profound difficulty achieving foot clearance in descending  GAIT: Gait  pattern: step to pattern and shuffling Distance walked:  Assistive device utilized: Environmental consultant - 2 wheeled Level of assistance: SBA Comments:   FUNCTIONAL TESTS:  5 times sit to stand: 16.47 sec Timed up and go (TUG): 57.43 sec 10 meter walk test: NT Berg Balance Scale: NT  PATIENT SURVEYS:  N/A     PATIENT EDUCATION: Education details: assessment findings, rationale for PT intervention Person educated: Patient Education method: Explanation Education comprehension: verbalized understanding    GOALS: Goals reviewed with patient? Yes  SHORT TERM GOALS: Target date: 06/24/2022    Patient will be independent in HEP to improve functional outcomes Baseline: Goal status: INITIAL  2.  Demo reduced risk for falls and improved BLE strength per time 15 sec 5xSTS Baseline: 16 sec Goal status: INITIAL  3.  Improve independence with stair ambulation at SBA-CGA using HR on left with or without AD to improve safety with home entry/exit Baseline: min A Goal status: INITIAL  LONG TERM GOALS: Target date: 07/22/2022    Demo reduced risk for falls per TUG test 35 sec w/ RW Baseline: 57 sec Goal status: INITIAL  2.  Demo improved efficiency of gait per speed of 2.5 ft/sec 10 meter walk test Baseline: 0.48 ft/sec Goal status: INITIAL  3.  Improve safety with mobility per supervision performance with stair ambulation and curb negotiation Baseline: min A w/ RW Goal status: INITIAL   ASSESSMENT:  CLINICAL IMPRESSION: Training in improving balance and gait kinematics to reduce risk for falls. Reviewed HEP and provided with training in adapting to home environment with additional activities to promote LE strength, coordination, and dynamic balance to reduce risk for falls. Difficulty maintaining eyes closed with balance challenge although  balance disturbance only mild-moderate but increased with head movements and narrow stance. Continued sessions to progress activities, very fatigued  at end of session requiring wheel chair to exit and transport to car  OBJECTIVE IMPAIRMENTS: Abnormal gait, decreased activity tolerance, decreased balance, decreased coordination, decreased knowledge of use of DME, decreased mobility, difficulty walking, decreased strength, impaired tone, and impaired UE functional use.   ACTIVITY LIMITATIONS: carrying, lifting, bending, stairs, transfers, reach over head, and locomotion level  PARTICIPATION LIMITATIONS: meal prep, cleaning, laundry, interpersonal relationship, and community activity  PERSONAL FACTORS: Age, Time since onset of injury/illness/exacerbation, and 3+ comorbidities: CVA, prostate CA, compression fx  are also affecting patient's functional outcome.   REHAB POTENTIAL: Good  CLINICAL DECISION MAKING: Evolving/moderate complexity  EVALUATION COMPLEXITY: Moderate  PLAN:  PT FREQUENCY: 1-2x/week, plan is 2x/wk x 4 wks then 1x/wk x 4 wks  PT DURATION: 8 weeks  PLANNED INTERVENTIONS: Therapeutic exercises, Therapeutic activity, Neuromuscular re-education, Balance training, Gait training, Patient/Family education, Self Care, Joint mobilization, Stair training, Vestibular training, Canalith repositioning, Orthotic/Fit training, DME instructions, Aquatic Therapy, Dry Needling, Spinal mobilization, Cryotherapy, Moist heat, and Manual therapy  PLAN FOR NEXT SESSION: trial AFO if wearing lace-up shoes, HEP review   3:32 PM, 06/02/22 M. Sherlyn Lees, PT, DPT Physical Therapist- [place] Office Number: [phone]

## 2022-06-04 ENCOUNTER — Ambulatory Visit: Payer: Medicare Other

## 2022-06-04 DIAGNOSIS — I69351 Hemiplegia and hemiparesis following cerebral infarction affecting right dominant side: Secondary | ICD-10-CM

## 2022-06-04 DIAGNOSIS — R262 Difficulty in walking, not elsewhere classified: Secondary | ICD-10-CM

## 2022-06-04 DIAGNOSIS — M6281 Muscle weakness (generalized): Secondary | ICD-10-CM

## 2022-06-04 DIAGNOSIS — R2681 Unsteadiness on feet: Secondary | ICD-10-CM

## 2022-06-04 DIAGNOSIS — R2689 Other abnormalities of gait and mobility: Secondary | ICD-10-CM

## 2022-06-04 NOTE — Therapy (Signed)
OUTPATIENT PHYSICAL THERAPY NEURO TREATMENT   Patient Name: Jose Beck MRN: [id number] DOB:[birth year], 77 y.o., male Today's Date: 06/02/2022   PCP: [name], MD REFERRING PROVIDER: [name], MD  END OF SESSION:  PT End of Session - 06/02/22 1532     Visit Number 3    Number of Visits 12    Date for PT Re-Evaluation 07/22/22    Authorization Type [employer]    PT Start Time 2992    PT Stop Time 4268    PT Time Calculation (min) 45 min             Past Medical History:  Diagnosis Date   Abdominal aortic aneurysm (AAA) ([place])    Aortic stenosis    a. 11/2015 Echo: EF 55-60%, Gr1 DD, mild AS.   Chronic combined systolic and diastolic CHF (congestive heart failure) ([place])    a. 07/2015: TEE showing EF of 40% b. 11/2015: Echo w/ EF 55-60%, Grade 1 DD, mild AS.   Coronary artery disease    a. patient reports 100% stenosis of LAD and RCA by cath in 1999. b. 11/2015 Cath: LM nl, LAD 100 - fills by L->L collats from D1, LCX 60d, OM1 lalrge/nl, OM2 small/nl, RCA 148m L->R collats.EF 35-45% (55-60% by echo).   Diabetes mellitus without complication ([place]    Type II   GERD (gastroesophageal reflux disease)    Hyperlipemia    Hypertension    Hypertensive heart disease    Stroke ([place]    a. 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   Thrombocytopenia ([place] 11/2015   Past Surgical History:  Procedure Laterality Date   ABDOMINAL AORTIC ENDOVASCULAR STENT GRAFT N/A 11/25/2017   Procedure: ABDOMINAL AORTIC ENDOVASCULAR STENT GRAFT;  Surgeon: BSerafina Mitchell MD;  Location: [facility];  Service: Vascular;  Laterality: N/A;   CARDIAC CATHETERIZATION     CARDIAC CATHETERIZATION N/A 11/16/2015   Procedure: Left Heart Cath and Coronary Angiography;  Surgeon: HBelva Crome MD;  [facility];  Service: Cardiovascular;  Laterality: N/A;   MOLE REMOVAL     TONSILLECTOMY     Patient Active Problem List   Diagnosis Date Noted   Acute  systolic heart failure ([facility] 04/27/2022   Atrial fibrillation with tachycardic ventricular rate (HS[place] 04/26/2022   Non-ST elevation (NSTEMI) myocardial infarction ([facility] 04/26/2022   Demand ischemia 04/26/2022   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   SAH (subarachnoid hemorrhage) ([facility] 12/19/2021   Cerebrovascular accident (HT[place] 01/04/2018   Compression fracture of thoracic vertebra ([place] 01/04/2018   AAA (abdominal aortic aneurysm) ([place] 11/25/2017   History of stroke 01/15/2017   Hyperlipidemia LDL goal <70 04/18/2016   Hypertension    Hypertensive heart disease    Chest pain 11/16/2015   Type 2 diabetes mellitus with circulatory disorder, with long-term current use of insulin ([facility] 11/16/2015   Essential hypertension 11/16/2015   Thrombocytopenia ([facility] 11/16/2015   Leukopenia 11/16/2015   Hyponatremia 11/16/2015   Chronic combined systolic and diastolic CHF (congestive heart failure) (H[place] 11/16/2015   CAD in native artery    Brainstem infarct, acute (H[place] 09/17/2015   Occlusion and stenosis of vertebral artery with cerebral infarction (HB[place] 09/17/2015   Obesity 09/17/2015   Ataxia, 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 Recent fall on 05/17/22  REFERRING DIAG: I63.9 (ICD-10-CM) - Cerebral infarction, unspecified I69.959 (ICD-10-CM) - Hemiplegia and hemiparesis following unspecified  cerebrovascular disease affecting unspecified side R27.0 (ICD-10-CM) - Ataxia, unspecified  THERAPY DIAG:  Difficulty in walking, not elsewhere classified  Other abnormalities of gait and mobility  Muscle weakness (generalized)  Unsteadiness on feet  Hemiplegia and hemiparesis following cerebral infarction affecting right dominant side (HCC)  Rationale for Evaluation and Treatment: Rehabilitation  SUBJECTIVE:                                                                                                                                                                                              SUBJECTIVE STATEMENT: Had to lower self to ground in shower this AM due LOB with stepping over lip into shower.   Pt accompanied by: significant other "[name]"  PERTINENT HISTORY: past medical history of stroke, AAA, type 2 diabetes, hypertension, CAD who presents to the emergency department with fall.  PAIN:  Are you having pain? No  PRECAUTIONS: Fall  WEIGHT BEARING RESTRICTIONS: No  FALLS: Has patient fallen in last 6 months? Yes. Number of falls 5  LIVING ENVIRONMENT: Lives with: lives with their family and lives with their spouse Lives in: House/apartment, ground floor set-up Stairs: Yes, garage stairs into house, will install rail on left side Has following equipment at home: Gilford Rile - 2 wheeled  PLOF: Independent with household mobility with device, Needs assistance with ADLs, and Needs assistance with homemaking. Mostly able to dress self, needs help with Depends. Some assist with shower transfers (4" lip)  PATIENT GOALS:   OBJECTIVE:   TODAY'S TREATMENT: 06/04/22 Activity Comments  Right PLS AFO trial Unsuccessful and no change to gait pattern  Gait training W/ RW, cues for "lead with right heel" use of visual target to increase right step height--unsuccessful on both trials  Sidestepping x 2 min 2.5# RLE in // bars  Standing hip flexion and abd 2x10 2.5# in // bars  LAQ  2.5# 2x10  balance -feet together: EO/EC x 30 sec. Head turns EO/EC 3x-- difficulty with maintaining eyes closed -standing on foam: same conditions    Pre-gait 3x10 reps advancing RLE over obstacle for heel strike and extending behind for pre-swing position 2.5# initially then fatigue     TODAY'S TREATMENT: 06/02/22 Activity Comments  Seated LE AROM 2x10 For coordination/warm up  Step taps 3x10 Sink cabinet 10 reps ea LE vs alternating  Sidestepping x 2 min Along counter  Mini-squats 1x10, 1x5 at  counter  balance -feet together: EO/EC x 30 sec. Head turns EO/EC 3x-- difficulty with maintaining eyes closed -standing on foam: same conditions        TODAY'S TREATMENT: 05/29/22 Activity Comments  10 meter walk time  1 min 22 sec = 0.48 ft sec  Foot lift 2x10 4" box For SLS   Lateral step/weight shift/reach 1x10 with low target due to R shoulder impairment--cues/facilitation of step/shift  Retrowalk/weight shift To facilitate hip extension  Postural re-ed Cues for t-spine extension and scap retraction. 1-2 finger sublux right shoulder  Gait training/pre-gait - w/ RW and trials for incr right step height -sidestepping // bars for lateral hip stability for SLS 1x 60 sec -hip abd foot slide 2x10 -hip flex to target 2x10 for step height    HOME EXERCISE PROGRAM: Access Code: [email or web address] URL: [email or web address] Date: 05/29/2022 Prepared by: Sherlyn Lees  Exercises - Forward Step Up with Counter Support  - 1 x daily - 7 x weekly - 3 sets - 10 reps  DIAGNOSTIC FINDINGS: CT head and C-spine which were both negative.  COGNITION: Overall cognitive status: Within functional limits for tasks assessed and has some aphasia from hx of CVA   SENSATION: Not tested notes numbness along RUE  COORDINATION: Grossly impaired RUE/RLE Inability to perform rapid alternating movements, heel to shin RLE. Unable to perform finger to nose RUE due to deficits/issues  EDEMA:    MUSCLE TONE: NT   DTRs:  NT  POSTURE: rounded shoulders and forward head  LOWER EXTREMITY ROM:     Active  Right Eval Left Eval  Hip flexion    Hip extension    Hip abduction    Hip adduction    Hip internal rotation    Hip external rotation    Knee flexion    Knee extension    Ankle dorsiflexion 10 15  Ankle plantarflexion    Ankle inversion    Ankle eversion     (Blank rows = not tested)  LOWER EXTREMITY MMT:    Grossly 4/5 BLE  BED MOBILITY:  NT  TRANSFERS: Assistive device  utilized: Environmental consultant - 2 wheeled and None  Sit to stand: Complete Independence and Modified independence Stand to sit: Complete Independence and Modified independence Chair to chair: Complete Independence and Modified independence Floor:  NT  RAMP:  NT  CURB:  Level of Assistance: CGA and Min A Assistive device utilized: Environmental consultant - 2 wheeled Curb Comments:   STAIRS: Level of Assistance: Min A Stair Negotiation Technique: Step to Pattern with Bilateral Rails Number of Stairs: 5  Height of Stairs: 4-6"  Comments: right Trendelenberg very prominent with stairs and profound difficulty achieving foot clearance in descending  GAIT: Gait pattern: step to pattern and shuffling Distance walked:  Assistive device utilized: Environmental consultant - 2 wheeled Level of assistance: SBA Comments:   FUNCTIONAL TESTS:  5 times sit to stand: 16.47 sec Timed up and go (TUG): 57.43 sec 10 meter walk test: NT Berg Balance Scale: NT  PATIENT SURVEYS:  N/A     PATIENT EDUCATION: Education details: assessment findings, rationale for PT intervention Person educated: Patient Education method: Explanation Education comprehension: verbalized understanding    GOALS: Goals reviewed with patient? Yes  SHORT TERM GOALS: Target date: 06/24/2022    Patient will be independent in HEP to improve functional outcomes Baseline: Goal status: IN PROGRESS  2.  Demo reduced risk for falls and improved BLE strength per time 15 sec 5xSTS Baseline: 16 sec Goal status: IN PROGRESS  3.  Improve independence with stair ambulation at SBA-CGA using HR on left with or without AD to improve safety with home entry/exit Baseline: min A Goal status: IN PROGRESS  LONG TERM GOALS: Target date: 07/22/2022  Demo reduced risk for falls per TUG test 35 sec w/ RW Baseline: 57 sec Goal status: IN PROGRESS  2.  Demo improved efficiency of gait per speed of 2.5 ft/sec 10 meter walk test Baseline: 0.48 ft/sec Goal status: IN  PROGRESS  3.  Improve safety with mobility per supervision performance with stair ambulation and curb negotiation Baseline: min A w/ RW Goal status: IN PROGRESS   ASSESSMENT:  CLINICAL IMPRESSION: No benefit to use of AFO for RLE to promote improved RLE step height or foot clearance. Demo improved foot clearance with visual guide or target. Quick onset of fatigue requiring frequent rest periods. Continued focus on improving proximal stability/strength/recruitment for improve swing phase performance for limb advancement  OBJECTIVE IMPAIRMENTS: Abnormal gait, decreased activity tolerance, decreased balance, decreased coordination, decreased knowledge of use of DME, decreased mobility, difficulty walking, decreased strength, impaired tone, and impaired UE functional use.   ACTIVITY LIMITATIONS: carrying, lifting, bending, stairs, transfers, reach over head, and locomotion level  PARTICIPATION LIMITATIONS: meal prep, cleaning, laundry, interpersonal relationship, and community activity  PERSONAL FACTORS: Age, Time since onset of injury/illness/exacerbation, and 3+ comorbidities: CVA, prostate CA, compression fx  are also affecting patient's functional outcome.   REHAB POTENTIAL: Good  CLINICAL DECISION MAKING: Evolving/moderate complexity  EVALUATION COMPLEXITY: Moderate  PLAN:  PT FREQUENCY: 1-2x/week, plan is 2x/wk x 4 wks then 1x/wk x 4 wks  PT DURATION: 8 weeks  PLANNED INTERVENTIONS: Therapeutic exercises, Therapeutic activity, Neuromuscular re-education, Balance training, Gait training, Patient/Family education, Self Care, Joint mobilization, Stair training, Vestibular training, Canalith repositioning, Orthotic/Fit training, DME instructions, Aquatic Therapy, Dry Needling, Spinal mobilization, Cryotherapy, Moist heat, and Manual therapy  PLAN FOR NEXT SESSION: HEP review   3:32 PM, 06/02/22 M. Sherlyn Lees, PT, DPT Physical Therapist- [place] Office Number: [phone]

## 2022-06-06 ENCOUNTER — Encounter: Payer: Self-pay | Admitting: Cardiology

## 2022-06-06 NOTE — Telephone Encounter (Signed)
Sent message to patient's wife via [employer]

## 2022-06-09 ENCOUNTER — Ambulatory Visit: Payer: Medicare Other

## 2022-06-09 ENCOUNTER — Other Ambulatory Visit: Payer: Self-pay | Admitting: Cardiology

## 2022-06-09 DIAGNOSIS — R262 Difficulty in walking, not elsewhere classified: Secondary | ICD-10-CM | POA: Diagnosis not present

## 2022-06-09 DIAGNOSIS — I480 Paroxysmal atrial fibrillation: Secondary | ICD-10-CM

## 2022-06-09 DIAGNOSIS — M6281 Muscle weakness (generalized): Secondary | ICD-10-CM

## 2022-06-09 DIAGNOSIS — R2689 Other abnormalities of gait and mobility: Secondary | ICD-10-CM

## 2022-06-09 DIAGNOSIS — R2681 Unsteadiness on feet: Secondary | ICD-10-CM

## 2022-06-09 NOTE — Therapy (Signed)
OUTPATIENT PHYSICAL THERAPY NEURO TREATMENT   Patient Name: Jose Beck MRN: [id number] DOB:[birth year], 77 y.o., male Today's Date: 06/09/2022   PCP: [name], MD REFERRING PROVIDER: [name], MD  END OF SESSION:  PT End of Session - 06/09/22 1142     Visit Number 5    Number of Visits 12    Date for PT Re-Evaluation 07/22/22    Authorization Type [employer] Medicare    PT Start Time 4098    PT Stop Time 1230    PT Time Calculation (min) 45 min             Past Medical History:  Diagnosis Date   Abdominal aortic aneurysm (AAA) ([place])    Aortic stenosis    a. 11/2015 Echo: EF 55-60%, Gr1 DD, mild AS.   Chronic combined systolic and diastolic CHF (congestive heart failure) ([place])    a. 07/2015: TEE showing EF of 40% b. 11/2015: Echo w/ EF 55-60%, Grade 1 DD, mild AS.   Coronary artery disease    a. patient reports 100% stenosis of LAD and RCA by cath in 1999. b. 11/2015 Cath: LM nl, LAD 100 - fills by L->L collats from D1, LCX 60d, OM1 lalrge/nl, OM2 small/nl, RCA 148m L->R collats.EF 35-45% (55-60% by echo).   Diabetes mellitus without complication ([facility]    Type II   GERD (gastroesophageal reflux disease)    Hyperlipemia    Hypertension    Hypertensive heart disease    Stroke ([place]    a. 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   Thrombocytopenia (HW[place] 11/2015   Past Surgical History:  Procedure Laterality Date   ABDOMINAL AORTIC ENDOVASCULAR STENT GRAFT N/A 11/25/2017   Procedure: ABDOMINAL AORTIC ENDOVASCULAR STENT GRAFT;  Surgeon: BSerafina Mitchell MD;  Location: [facility];  Service: Vascular;  Laterality: N/A;   CARDIAC CATHETERIZATION     CARDIAC CATHETERIZATION N/A 11/16/2015   Procedure: Left Heart Cath and Coronary Angiography;  Surgeon: HBelva Crome MD;  [facility];  Service: Cardiovascular;  Laterality: N/A;   MOLE REMOVAL     TONSILLECTOMY     Patient Active Problem List   Diagnosis Date Noted   Acute  systolic heart failure (HM[place] 04/27/2022   Atrial fibrillation with tachycardic ventricular rate (HEl [place] 04/26/2022   Non-ST elevation (NSTEMI) myocardial infarction (H[place] 04/26/2022   Demand ischemia 04/26/2022   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   SAH (subarachnoid hemorrhage) ([facility] [place] 12/19/2021   Cerebrovascular accident (HR[place] 01/04/2018   Compression fracture of thoracic vertebra (HT[place] 01/04/2018   AAA (abdominal aortic aneurysm) (HG[place] 11/25/2017   History of stroke 01/15/2017   Hyperlipidemia LDL goal <70 04/18/2016   Hypertension    Hypertensive heart disease    Chest pain 11/16/2015   Type 2 diabetes mellitus with circulatory disorder, with long-term current use of insulin (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 Recent fall on 05/17/22  REFERRING DIAG: I63.9 (ICD-10-CM) - Cerebral infarction, unspecified I69.959 (ICD-10-CM) - Hemiplegia and hemiparesis following unspecified  cerebrovascular disease affecting unspecified side R27.0 (ICD-10-CM) - Ataxia, unspecified  THERAPY DIAG:  Difficulty in walking, not elsewhere classified  Other abnormalities of gait and mobility  Muscle weakness (generalized)  Unsteadiness on feet  Rationale for Evaluation and Treatment: Rehabilitation  SUBJECTIVE:                                                                                                                                                                                              SUBJECTIVE STATEMENT: "Using suction handles in the shower which has helped stepping in/out"   Pt accompanied by: significant other "[name]"  PERTINENT HISTORY: past medical history of stroke, AAA, type 2 diabetes, hypertension, CAD who presents to the emergency department with fall.  PAIN:  Are you having pain? No  PRECAUTIONS: Fall  WEIGHT BEARING RESTRICTIONS: No  FALLS: Has patient fallen in last 6 months? Yes. Number of falls 5  LIVING ENVIRONMENT: Lives with: lives with their family and lives with their spouse Lives in: House/apartment, ground floor set-up Stairs: Yes, garage stairs into house, will install rail on left side Has following equipment at home: Gilford Rile - 2 wheeled  PLOF: Independent with household mobility with device, Needs assistance with ADLs, and Needs assistance with homemaking. Mostly able to dress self, needs help with Depends. Some assist with shower transfers (4" lip)  PATIENT GOALS:   OBJECTIVE:   TODAY'S TREATMENT: 06/09/22 Activity Comments  NU-step x 10 min Cadence intervals 15 sec 80-100 SPM to improve coordination/fast alternating  HEP review -alt step taps 1x10 -sidestepping x 2 min 3# -mini-squats at counter 1x10  Pre-gait/gait/stair training -limb advancement drill over half-roll 2x10 3# for heel strike initial contact -stair ambulation w/ left HR and trials in LLE vs RLE ascending. Descends backwards  LAQ 3# 2x10 for knee ext strength  Standing hip flex 3# 2x10 for foot clearance in gait--use of visual target for knee height       TODAY'S TREATMENT: 06/04/22 Activity Comments  Right PLS AFO trial Unsuccessful and no change to gait pattern  Gait training W/ RW, cues for "lead with right heel" use of visual target to increase right step height--unsuccessful on both trials  Sidestepping x 2 min 2.5# RLE in // bars  Standing hip flexion and abd 2x10 2.5# in // bars  LAQ  2.5# 2x10  balance -feet together: EO/EC x 30 sec. Head turns  EO/EC 3x-- difficulty with maintaining eyes closed -standing on foam: same conditions    Pre-gait 3x10 reps advancing RLE over obstacle for heel strike and extending behind for pre-swing position 2.5# initially then fatigue  TODAY'S TREATMENT: 06/02/22 Activity Comments  Seated LE AROM 2x10 For coordination/warm up  Step taps 3x10 Sink cabinet 10 reps ea LE vs alternating  Sidestepping x 2 min Along counter  Mini-squats 1x10, 1x5 at counter  balance -feet together: EO/EC x 30 sec. Head turns EO/EC 3x-- difficulty with maintaining eyes closed -standing on foam: same conditions          HOME EXERCISE PROGRAM: Access Code: [email or web address] URL: [email or web address] Date: 05/29/2022 Prepared by: Sherlyn Lees  Exercises - Forward Step Up with Counter Support  - 1 x daily - 7 x weekly - 3 sets - 10 reps - Side Stepping with Counter Support  - 1 x daily - 7 x weekly - 1-3 sets - 2 min rounds hold - Mini Squat with Counter Support  - 1 x daily - 7 x weekly - 3 sets - 5-10 reps  DIAGNOSTIC FINDINGS: CT head and C-spine which were both negative.  COGNITION: Overall cognitive status: Within functional limits for tasks assessed and has some aphasia from hx of CVA   SENSATION: Not tested notes numbness along RUE  COORDINATION: Grossly impaired RUE/RLE Inability to perform rapid alternating movements, heel to shin RLE. Unable to perform finger to nose RUE due to deficits/issues  EDEMA:    MUSCLE TONE: NT   DTRs:  NT  POSTURE: rounded shoulders and forward head  LOWER EXTREMITY ROM:     Active  Right Eval Left Eval  Hip flexion    Hip extension    Hip abduction    Hip adduction    Hip internal rotation    Hip external rotation    Knee flexion    Knee extension    Ankle dorsiflexion 10 15  Ankle plantarflexion    Ankle inversion    Ankle eversion     (Blank rows = not tested)  LOWER EXTREMITY MMT:    Grossly 4/5 BLE  BED MOBILITY:   NT  TRANSFERS: Assistive device utilized: Environmental consultant - 2 wheeled and None  Sit to stand: Complete Independence and Modified independence Stand to sit: Complete Independence and Modified independence Chair to chair: Complete Independence and Modified independence Floor:  NT  RAMP:  NT  CURB:  Level of Assistance: CGA and Min A Assistive device utilized: Environmental consultant - 2 wheeled Curb Comments:   STAIRS: Level of Assistance: Min A Stair Negotiation Technique: Step to Pattern with Bilateral Rails Number of Stairs: 5  Height of Stairs: 4-6"  Comments: right Trendelenberg very prominent with stairs and profound difficulty achieving foot clearance in descending  GAIT: Gait pattern: step to pattern and shuffling Distance walked:  Assistive device utilized: Environmental consultant - 2 wheeled Level of assistance: SBA Comments:   FUNCTIONAL TESTS:  5 times sit to stand: 16.47 sec Timed up and go (TUG): 57.43 sec 10 meter walk test: NT Berg Balance Scale: NT  PATIENT SURVEYS:  N/A     PATIENT EDUCATION: Education details: assessment findings, rationale for PT intervention Person educated: Patient Education method: Explanation Education comprehension: verbalized understanding    GOALS: Goals reviewed with patient? Yes  SHORT TERM GOALS: Target date: 06/24/2022    Patient will be independent in HEP to improve functional outcomes Baseline: Goal status: IN PROGRESS  2.  Demo reduced risk for falls and improved BLE strength per time 15 sec 5xSTS Baseline: 16 sec Goal status: IN PROGRESS  3.  Improve independence with stair ambulation at SBA-CGA using HR on left with or without AD to improve safety with home  entry/exit Baseline: min A Goal status: IN PROGRESS  LONG TERM GOALS: Target date: 07/22/2022    Demo reduced risk for falls per TUG test 35 sec w/ RW Baseline: 57 sec Goal status: IN PROGRESS  2.  Demo improved efficiency of gait per speed of 2.5 ft/sec 10 meter walk  test Baseline: 0.48 ft/sec Goal status: IN PROGRESS  3.  Improve safety with mobility per supervision performance with stair ambulation and curb negotiation Baseline: min A w/ RW Goal status: IN PROGRESS   ASSESSMENT:  CLINICAL IMPRESSION: Increased exercise activities today with use of NU-step for fast, alternating and increase in resistance for PRE to 3# from previous 2.5#.  Able to reduce rest periods by 10-25% length of time. Addressed stair ambulation with trials and pt demo preference for using RLE for ascending despite heavy compensation due to weakness. Continued sessions for HEP development and gait strategies to improve stride and safety with curb/stair negotiation  OBJECTIVE IMPAIRMENTS: Abnormal gait, decreased activity tolerance, decreased balance, decreased coordination, decreased knowledge of use of DME, decreased mobility, difficulty walking, decreased strength, impaired tone, and impaired UE functional use.   ACTIVITY LIMITATIONS: carrying, lifting, bending, stairs, transfers, reach over head, and locomotion level  PARTICIPATION LIMITATIONS: meal prep, cleaning, laundry, interpersonal relationship, and community activity  PERSONAL FACTORS: Age, Time since onset of injury/illness/exacerbation, and 3+ comorbidities: CVA, prostate CA, compression fx  are also affecting patient's functional outcome.   REHAB POTENTIAL: Good  CLINICAL DECISION MAKING: Evolving/moderate complexity  EVALUATION COMPLEXITY: Moderate  PLAN:  PT FREQUENCY: 1-2x/week, plan is 2x/wk x 4 wks then 1x/wk x 4 wks  PT DURATION: 8 weeks  PLANNED INTERVENTIONS: Therapeutic exercises, Therapeutic activity, Neuromuscular re-education, Balance training, Gait training, Patient/Family education, Self Care, Joint mobilization, Stair training, Vestibular training, Canalith repositioning, Orthotic/Fit training, DME instructions, Aquatic Therapy, Dry Needling, Spinal mobilization, Cryotherapy, Moist heat, and  Manual therapy  PLAN FOR NEXT SESSION: HEP review   11:43 AM, 06/09/22 M. Sherlyn Lees, PT, DPT Physical Therapist- [place] Office Number: [phone]

## 2022-06-10 NOTE — Telephone Encounter (Signed)
Prescription refill request for Eliquis received. Indication: Afib  Last office visit: 05/23/22 ([place])  Scr: 1.31 (05/17/21)  Age: 77 Weight: 93.4kg  Appropriate dose. Refill sent.

## 2022-06-11 ENCOUNTER — Ambulatory Visit: Payer: Medicare Other

## 2022-06-12 ENCOUNTER — Ambulatory Visit: Payer: Medicare Other | Attending: Internal Medicine

## 2022-06-12 DIAGNOSIS — R2681 Unsteadiness on feet: Secondary | ICD-10-CM | POA: Insufficient documentation

## 2022-06-12 DIAGNOSIS — M6281 Muscle weakness (generalized): Secondary | ICD-10-CM | POA: Diagnosis present

## 2022-06-12 DIAGNOSIS — I69351 Hemiplegia and hemiparesis following cerebral infarction affecting right dominant side: Secondary | ICD-10-CM | POA: Diagnosis present

## 2022-06-12 DIAGNOSIS — R2689 Other abnormalities of gait and mobility: Secondary | ICD-10-CM | POA: Diagnosis present

## 2022-06-12 DIAGNOSIS — R262 Difficulty in walking, not elsewhere classified: Secondary | ICD-10-CM | POA: Insufficient documentation

## 2022-06-12 NOTE — Therapy (Signed)
OUTPATIENT PHYSICAL THERAPY NEURO TREATMENT   Patient Name: Jose Beck MRN: [id number] DOB:03-11-1946, 77 y.o., male Today's Date: 06/12/2022   PCP: [name], MD REFERRING PROVIDER: [name], MD  END OF SESSION:  PT End of Session - 06/12/22 1448     Visit Number 6    Number of Visits 12    Date for PT Re-Evaluation 07/22/22    Authorization Type [employer] Medicare    PT Start Time 4818    PT Stop Time 5631    PT Time Calculation (min) 45 min             Past Medical History:  Diagnosis Date   Abdominal aortic aneurysm (AAA) ([place])    Aortic stenosis    a. 11/2015 Echo: EF 55-60%, Gr1 DD, mild AS.   Chronic combined systolic and diastolic CHF (congestive heart failure) ([place])    a. 07/2015: TEE showing EF of 40% b. 11/2015: Echo w/ EF 55-60%, Grade 1 DD, mild AS.   Coronary artery disease    a. patient reports 100% stenosis of LAD and RCA by cath in 1999. b. 11/2015 Cath: LM nl, LAD 100 - fills by L->L collats from D1, LCX 60d, OM1 lalrge/nl, OM2 small/nl, RCA 187m L->R collats.EF 35-45% (55-60% by echo).   Diabetes mellitus without complication ([place]    Type II   GERD (gastroesophageal reflux disease)    Hyperlipemia    Hypertension    Hypertensive heart disease    Stroke ([facility]    a. 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   Thrombocytopenia ([place] 11/2015   Past Surgical History:  Procedure Laterality Date   ABDOMINAL AORTIC ENDOVASCULAR STENT GRAFT N/A 11/25/2017   Procedure: ABDOMINAL AORTIC ENDOVASCULAR STENT GRAFT;  Surgeon: BSerafina Mitchell MD;  Location: [facility];  Service: Vascular;  Laterality: N/A;   CARDIAC CATHETERIZATION     CARDIAC CATHETERIZATION N/A 11/16/2015   Procedure: Left Heart Cath and Coronary Angiography;  Surgeon: HBelva Crome MD;  [facility];  Service: Cardiovascular;  Laterality: N/A;   MOLE REMOVAL     TONSILLECTOMY     Patient Active Problem List   Diagnosis Date Noted   Acute  systolic heart failure ([facility] 04/27/2022   Atrial fibrillation with tachycardic ventricular rate ([facility] 04/26/2022   Non-ST elevation (NSTEMI) myocardial infarction (HM[place] 04/26/2022   Demand ischemia 04/26/2022   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   SAH (subarachnoid hemorrhage) ([place] 12/19/2021   Cerebrovascular accident (HD[place] 01/04/2018   Compression fracture of thoracic vertebra (HS[place] 01/04/2018   AAA (abdominal aortic aneurysm) ([place] 11/25/2017   History of stroke 01/15/2017   Hyperlipidemia LDL goal <70 04/18/2016   Hypertension    Hypertensive heart disease    Chest pain 11/16/2015   Type 2 diabetes mellitus with circulatory disorder, with long-term current use of insulin (HC[place] 11/16/2015   Essential hypertension 11/16/2015   Thrombocytopenia ([facility] 11/16/2015   Leukopenia 11/16/2015   Hyponatremia 11/16/2015   Chronic combined systolic and diastolic CHF (congestive heart failure) ([place] 11/16/2015   CAD in native artery    Brainstem infarct, acute (H[place] 09/17/2015   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 Recent fall on 05/17/22  REFERRING DIAG: I63.9 (ICD-10-CM) - Cerebral infarction, unspecified I69.959 (ICD-10-CM) - Hemiplegia and hemiparesis following unspecified  cerebrovascular disease affecting unspecified side R27.0 (ICD-10-CM) - Ataxia, unspecified  THERAPY DIAG:  Difficulty in walking, not elsewhere classified  Other abnormalities of gait and mobility  Muscle weakness (generalized)  Unsteadiness on feet  Rationale for Evaluation and Treatment: Rehabilitation  SUBJECTIVE:                                                                                                                                                                                              SUBJECTIVE STATEMENT: Feeling ok, been doing HEP every day that not in therapy   Pt accompanied by: significant other "[name]"  PERTINENT HISTORY: past medical history of stroke, AAA, type 2 diabetes, hypertension, CAD who presents to the emergency department with fall.  PAIN:  Are you having pain? No  PRECAUTIONS: Fall  WEIGHT BEARING RESTRICTIONS: No  FALLS: Has patient fallen in last 6 months? Yes. Number of falls 5  LIVING ENVIRONMENT: Lives with: lives with their family and lives with their spouse Lives in: House/apartment, ground floor set-up Stairs: Yes, garage stairs into house, will install rail on left side Has following equipment at home: Gilford Rile - 2 wheeled  PLOF: Independent with household mobility with device, Needs assistance with ADLs, and Needs assistance with homemaking. Mostly able to dress self, needs help with Depends. Some assist with shower transfers (4" lip)  PATIENT GOALS:   OBJECTIVE:   TODAY'S TREATMENT: 06/12/22 Activity Comments  Seated LE AROM 2x10 For dynamic warm-up  Mini-squats w/ counter 2x10 Red t-loop around knees  Sidestepping at counter x 2 min Red t-loop around knees  Resisted step taps/March 3x10 Red loop around feet  Standing on foam -sidestepping on/off to reinforce wide step to promote foot clearance when getting in/out shower  -stance: EO/EC, head turns 5x  Stride length drills -limb advancement over bolster 2x10  Car transfer CGA-min A    TODAY'S TREATMENT: 06/09/22 Activity Comments  NU-step x 10 min Cadence intervals 15 sec 80-100 SPM to improve coordination/fast alternating  HEP review -alt step taps 1x10 -sidestepping x 2 min 3# -mini-squats at counter 1x10  Pre-gait/gait/stair training -limb advancement drill over half-roll 2x10 3# for heel strike initial contact -stair ambulation w/ left HR and trials in LLE vs RLE ascending. Descends backwards  LAQ 3# 2x10 for knee ext strength  Standing hip flex 3# 2x10 for foot  clearance in gait--use of visual target for knee height           HOME EXERCISE PROGRAM: Access Code: [email or web address] URL: [email or web address] Date: 06/12/2022 Prepared by: Sherlyn Lees  Exercises - Squat with Chair Touch and Resistance Loop  - 1 x  daily - 7 x weekly - 3 sets - 10 reps - Side Stepping with Resistance at Thighs and Counter Support  - 1 x daily - 7 x weekly - 3 sets - 2 min hold - Marching with Resistance  - 1 x daily - 7 x weekly - 3 sets - 10 reps  DIAGNOSTIC FINDINGS: CT head and C-spine which were both negative.  COGNITION: Overall cognitive status: Within functional limits for tasks assessed and has some aphasia from hx of CVA   SENSATION: Not tested notes numbness along RUE  COORDINATION: Grossly impaired RUE/RLE Inability to perform rapid alternating movements, heel to shin RLE. Unable to perform finger to nose RUE due to deficits/issues  EDEMA:    MUSCLE TONE: NT   DTRs:  NT  POSTURE: rounded shoulders and forward head  LOWER EXTREMITY ROM:     Active  Right Eval Left Eval  Hip flexion    Hip extension    Hip abduction    Hip adduction    Hip internal rotation    Hip external rotation    Knee flexion    Knee extension    Ankle dorsiflexion 10 15  Ankle plantarflexion    Ankle inversion    Ankle eversion     (Blank rows = not tested)  LOWER EXTREMITY MMT:    Grossly 4/5 BLE  BED MOBILITY:  NT  TRANSFERS: Assistive device utilized: Environmental consultant - 2 wheeled and None  Sit to stand: Complete Independence and Modified independence Stand to sit: Complete Independence and Modified independence Chair to chair: Complete Independence and Modified independence Floor:  NT  RAMP:  NT  CURB:  Level of Assistance: CGA and Min A Assistive device utilized: Environmental consultant - 2 wheeled Curb Comments:   STAIRS: Level of Assistance: Min A Stair Negotiation Technique: Step to Pattern with Bilateral Rails Number of Stairs: 5  Height of  Stairs: 4-6"  Comments: right Trendelenberg very prominent with stairs and profound difficulty achieving foot clearance in descending  GAIT: Gait pattern: step to pattern and shuffling Distance walked:  Assistive device utilized: Environmental consultant - 2 wheeled Level of assistance: SBA Comments:   FUNCTIONAL TESTS:  5 times sit to stand: 16.47 sec Timed up and go (TUG): 57.43 sec 10 meter walk test: NT Berg Balance Scale: NT  PATIENT SURVEYS:  N/A     PATIENT EDUCATION: Education details: assessment findings, rationale for PT intervention Person educated: Patient Education method: Explanation Education comprehension: verbalized understanding    GOALS: Goals reviewed with patient? Yes  SHORT TERM GOALS: Target date: 06/24/2022    Patient will be independent in HEP to improve functional outcomes Baseline: Goal status: MET  2.  Demo reduced risk for falls and improved BLE strength per time 15 sec 5xSTS Baseline: 16 sec Goal status: IN PROGRESS  3.  Improve independence with stair ambulation at SBA-CGA using HR on left with or without AD to improve safety with home entry/exit Baseline: min A Goal status: IN PROGRESS  LONG TERM GOALS: Target date: 07/22/2022    Demo reduced risk for falls per TUG test 35 sec w/ RW Baseline: 57 sec Goal status: IN PROGRESS  2.  Demo improved efficiency of gait per speed of 2.5 ft/sec 10 meter walk test Baseline: 0.48 ft/sec Goal status: IN PROGRESS  3.  Improve safety with mobility per supervision performance with stair ambulation and curb negotiation Baseline: min A w/ RW Goal status: IN PROGRESS   ASSESSMENT:  CLINICAL IMPRESSION: Able to progress HEP  development today to update current activities to include red resistance loop which provided to pt and with updated activities.  Improved activity tolerance today requiring 10% fewer rest periods. Difficulty with large amplitude movements RLE requiring increased cues for sustained  performance.  Goal for next session will be to continue with activity tolerance and hopefully be able to leave clinic and return to car via walking vs wheelchair   OBJECTIVE IMPAIRMENTS: Abnormal gait, decreased activity tolerance, decreased balance, decreased coordination, decreased knowledge of use of DME, decreased mobility, difficulty walking, decreased strength, impaired tone, and impaired UE functional use.   ACTIVITY LIMITATIONS: carrying, lifting, bending, stairs, transfers, reach over head, and locomotion level  PARTICIPATION LIMITATIONS: meal prep, cleaning, laundry, interpersonal relationship, and community activity  PERSONAL FACTORS: Age, Time since onset of injury/illness/exacerbation, and 3+ comorbidities: CVA, prostate CA, compression fx  are also affecting patient's functional outcome.   REHAB POTENTIAL: Good  CLINICAL DECISION MAKING: Evolving/moderate complexity  EVALUATION COMPLEXITY: Moderate  PLAN:  PT FREQUENCY: 1-2x/week, plan is 2x/wk x 4 wks then 1x/wk x 4 wks  PT DURATION: 8 weeks  PLANNED INTERVENTIONS: Therapeutic exercises, Therapeutic activity, Neuromuscular re-education, Balance training, Gait training, Patient/Family education, Self Care, Joint mobilization, Stair training, Vestibular training, Canalith repositioning, Orthotic/Fit training, DME instructions, Aquatic Therapy, Dry Needling, Spinal mobilization, Cryotherapy, Moist heat, and Manual therapy  PLAN FOR NEXT SESSION: HEP review w/ red t-loop, activities for RLE stride/hip stability. Goal for end of session is to walk to car (will likely need to leave some time to accomplish this within appointment period)   2:49 PM, 06/12/22 M. Sherlyn Lees, PT, DPT Physical Therapist- [place] Office Number: [phone]

## 2022-06-16 ENCOUNTER — Encounter: Payer: Self-pay | Admitting: Physical Therapy

## 2022-06-16 ENCOUNTER — Ambulatory Visit: Payer: Medicare Other | Admitting: Physical Therapy

## 2022-06-16 DIAGNOSIS — M6281 Muscle weakness (generalized): Secondary | ICD-10-CM

## 2022-06-16 DIAGNOSIS — R2689 Other abnormalities of gait and mobility: Secondary | ICD-10-CM

## 2022-06-16 DIAGNOSIS — R2681 Unsteadiness on feet: Secondary | ICD-10-CM

## 2022-06-16 DIAGNOSIS — R262 Difficulty in walking, not elsewhere classified: Secondary | ICD-10-CM | POA: Diagnosis not present

## 2022-06-16 NOTE — Therapy (Signed)
OUTPATIENT PHYSICAL THERAPY NEURO TREATMENT   Patient Name: Jose Beck MRN: [id number] DOB:1945-07-01, 77 y.o., male Today's Date: 06/17/2022   PCP: [name], MD REFERRING PROVIDER: [name], MD  END OF SESSION:  PT End of Session - 06/16/22 1411     Visit Number 7    Number of Visits 12    Date for PT Re-Evaluation 07/22/22    Authorization Type [employer] Medicare    PT Start Time 1407    PT Stop Time 4315    PT Time Calculation (min) 40 min    Equipment Utilized During Treatment Gait belt    Activity Tolerance Patient tolerated treatment well    Behavior During Therapy WFL for tasks assessed/performed             Past Medical History:  Diagnosis Date   Abdominal aortic aneurysm (AAA) ([place])    Aortic stenosis    a. 11/2015 Echo: EF 55-60%, Gr1 DD, mild AS.   Chronic combined systolic and diastolic CHF (congestive heart failure) ([place])    a. 07/2015: TEE showing EF of 40% b. 11/2015: Echo w/ EF 55-60%, Grade 1 DD, mild AS.   Coronary artery disease    a. patient reports 100% stenosis of LAD and RCA by cath in 1999. b. 11/2015 Cath: LM nl, LAD 100 - fills by L->L collats from D1, LCX 60d, OM1 lalrge/nl, OM2 small/nl, RCA 180m L->R collats.EF 35-45% (55-60% by echo).   Diabetes mellitus without complication ([facility]    Type II   GERD (gastroesophageal reflux disease)    Hyperlipemia    Hypertension    Hypertensive heart disease    Stroke ([place]    a. 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   Thrombocytopenia ([facility] 11/2015   Past Surgical History:  Procedure Laterality Date   ABDOMINAL AORTIC ENDOVASCULAR STENT GRAFT N/A 11/25/2017   Procedure: ABDOMINAL AORTIC ENDOVASCULAR STENT GRAFT;  Surgeon: BSerafina Mitchell MD;  Location: [facility];  Service: Vascular;  Laterality: N/A;   CARDIAC CATHETERIZATION     CARDIAC CATHETERIZATION N/A 11/16/2015   Procedure: Left Heart Cath and Coronary Angiography;  Surgeon: HBelva Crome MD;  [facility];  Service: Cardiovascular;  Laterality: N/A;   MOLE REMOVAL     TONSILLECTOMY     Patient Active Problem List   Diagnosis Date Noted   Acute systolic heart failure (HM[place] 04/27/2022   Atrial fibrillation with tachycardic ventricular rate (HC[facility] [place] 04/26/2022   Non-ST elevation (NSTEMI) myocardial infarction (HD[place] 04/26/2022   Demand ischemia 04/26/2022   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   SAH (subarachnoid hemorrhage) (HS[place] 12/19/2021   Cerebrovascular accident ([place] 01/04/2018   Compression fracture of thoracic vertebra (H[place] 01/04/2018   AAA (abdominal aortic aneurysm) (HS[place]wenks[place] 11/25/2017   History of stroke 01/15/2017   Hyperlipidemia LDL goal <70 04/18/2016   Hypertension    Hypertensive heart disease    Chest pain 11/16/2015   Type 2 diabetes mellitus with circulatory disorder, with long-term current use of insulin (HChenango Bridge 11/16/2015   Essential hypertension 11/16/2015   Thrombocytopenia (H[place] 11/16/2015   Leukopenia 11/16/2015   Hyponatremia 11/16/2015   Chronic combined systolic and diastolic CHF (congestive heart failure) (HSouth [place] 11/16/2015   CAD in native artery    Brainstem infarct, acute (HDamon 09/17/2015   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 Recent fall on 05/17/22  REFERRING DIAG: I63.9 (ICD-10-CM) - Cerebral infarction, unspecified I69.959 (ICD-10-CM) - Hemiplegia and hemiparesis following unspecified cerebrovascular disease affecting unspecified side R27.0 (ICD-10-CM) - Ataxia, unspecified  THERAPY DIAG:  Muscle weakness (generalized)  Other abnormalities of gait and mobility  Unsteadiness on feet  Rationale for Evaluation and Treatment: Rehabilitation  SUBJECTIVE:                                                                                                                                                                                              SUBJECTIVE STATEMENT: My walker feels like the wheels are not right.   Pt accompanied by: significant other "[name]"  PERTINENT HISTORY: past medical history of stroke, AAA, type 2 diabetes, hypertension, CAD who presents to the emergency department with fall.  PAIN:  Are you having pain? No  PRECAUTIONS: Fall  WEIGHT BEARING RESTRICTIONS: No  FALLS: Has patient fallen in last 6 months? Yes. Number of falls 5  LIVING ENVIRONMENT: Lives with: lives with their family and lives with their spouse Lives in: House/apartment, ground floor set-up Stairs: Yes, garage stairs into house, will install rail on left side Has following equipment at home: Gilford Rile - 2 wheeled  PLOF: Independent with household mobility with device, Needs assistance with ADLs, and Needs assistance with homemaking. Mostly able to dress self, needs help with Depends. Some assist with shower transfers (4" lip)  PATIENT GOALS:   OBJECTIVE:   Discussed possibility of switching wheels to outer position of front legs (they currently are in the inner position) for possibility of improved ease of steering.  Patient and wife decline trying this at this time. TODAY'S TREATMENT: 06/16/2022 Activity Comments  Seated LAQ, marching, ankle pumps 2 x 10 reps For seated warm up activity  Standing resisted sidestepping x 2 minutes, red band C/o dizziness prior to putting on theraband and it resolves with standing  Vitals:  133/83, HR 86 bpm; O2 94%   Standing resisted step tap to 4" block, red band, 2 x 10 reps Pt reports he's having trouble finding a place to do this at home  Stagger stance forward/back rocking 2 x 10 reps BUE support and cues for techniuqe  Gait training from gym>sidewalk area to car, using RW with min assist Pt able to ambulate with no seated rest breaks, but takes quick, short standing rest breaks,  throughout.  He tends to stay to L side of RW and has to move RW to R.  Cues for increased step length and foot clearance.    Car transfer RW>car with min guard  HOME EXERCISE PROGRAM: Access Code: [email or web address] URL: [email or web address] Date: 06/12/2022 Prepared by: Sherlyn Lees  Exercises - Squat with Chair Touch and Resistance Loop  - 1 x daily - 7 x weekly - 3 sets - 10 reps - Side Stepping with Resistance at Thighs and Counter Support  - 1 x daily - 7 x weekly - 3 sets - 2 min hold - Marching with Resistance  - 1 x daily - 7 x weekly - 3 sets - 10 reps ---------------------------------------- From eval: DIAGNOSTIC FINDINGS: CT head and C-spine which were both negative.  COGNITION: Overall cognitive status: Within functional limits for tasks assessed and has some aphasia from hx of CVA   SENSATION: Not tested notes numbness along RUE  COORDINATION: Grossly impaired RUE/RLE Inability to perform rapid alternating movements, heel to shin RLE. Unable to perform finger to nose RUE due to deficits/issues  EDEMA:    MUSCLE TONE: NT   DTRs:  NT  POSTURE: rounded shoulders and forward head  LOWER EXTREMITY ROM:     Active  Right Eval Left Eval  Hip flexion    Hip extension    Hip abduction    Hip adduction    Hip internal rotation    Hip external rotation    Knee flexion    Knee extension    Ankle dorsiflexion 10 15  Ankle plantarflexion    Ankle inversion    Ankle eversion     (Blank rows = not tested)  LOWER EXTREMITY MMT:    Grossly 4/5 BLE  BED MOBILITY:  NT  TRANSFERS: Assistive device utilized: Environmental consultant - 2 wheeled and None  Sit to stand: Complete Independence and Modified independence Stand to sit: Complete Independence and Modified independence Chair to chair: Complete Independence and Modified independence Floor:  NT  RAMP:  NT  CURB:  Level of Assistance: CGA and Min A Assistive device utilized: Environmental consultant - 2 wheeled Curb  Comments:   STAIRS: Level of Assistance: Min A Stair Negotiation Technique: Step to Pattern with Bilateral Rails Number of Stairs: 5  Height of Stairs: 4-6"  Comments: right Trendelenberg very prominent with stairs and profound difficulty achieving foot clearance in descending  GAIT: Gait pattern: step to pattern and shuffling Distance walked:  Assistive device utilized: Environmental consultant - 2 wheeled Level of assistance: SBA Comments:   FUNCTIONAL TESTS:  5 times sit to stand: 16.47 sec Timed up and go (TUG): 57.43 sec 10 meter walk test: NT Berg Balance Scale: NT  PATIENT SURVEYS:  N/A     PATIENT EDUCATION: Education details: assessment findings, rationale for PT intervention Person educated: Patient Education method: Explanation Education comprehension: verbalized understanding    GOALS: Goals reviewed with patient? Yes  SHORT TERM GOALS: Target date: 06/24/2022    Patient will be independent in HEP to improve functional outcomes Baseline: Goal status: MET  2.  Demo reduced risk for falls and improved BLE strength per time 15 sec 5xSTS Baseline: 16 sec Goal status: IN PROGRESS  3.  Improve independence with stair ambulation at SBA-CGA using HR on left with or without AD to improve safety with home entry/exit Baseline: min A Goal status: IN PROGRESS  LONG TERM GOALS: Target date: 07/22/2022    Demo reduced risk for falls per TUG test 35 sec w/ RW Baseline: 57 sec Goal status: IN PROGRESS  2.  Demo improved efficiency of gait per speed of 2.5 ft/sec 10 meter walk test Baseline: 0.48 ft/sec Goal status: IN PROGRESS  3.  Improve safety  with mobility per supervision performance with stair ambulation and curb negotiation Baseline: min A w/ RW Goal status: IN PROGRESS   ASSESSMENT:  CLINICAL IMPRESSION: Skilled PT focused on review of HEP, standing exercises and gait training for distance from gym>car transfer at end of session.  Pt was able to perform this  task, for the first time at the end of PT session.  He does take multiple short standing rest breaks and has difficulty staying within the BOS of the RW during gait to the car. He will continue to benefit from skilled PT towards goals for improved functional mobility and decreased fall risk.  OBJECTIVE IMPAIRMENTS: Abnormal gait, decreased activity tolerance, decreased balance, decreased coordination, decreased knowledge of use of DME, decreased mobility, difficulty walking, decreased strength, impaired tone, and impaired UE functional use.   ACTIVITY LIMITATIONS: carrying, lifting, bending, stairs, transfers, reach over head, and locomotion level  PARTICIPATION LIMITATIONS: meal prep, cleaning, laundry, interpersonal relationship, and community activity  PERSONAL FACTORS: Age, Time since onset of injury/illness/exacerbation, and 3+ comorbidities: CVA, prostate CA, compression fx  are also affecting patient's functional outcome.   REHAB POTENTIAL: Good  CLINICAL DECISION MAKING: Evolving/moderate complexity  EVALUATION COMPLEXITY: Moderate  PLAN:  PT FREQUENCY: 1-2x/week, plan is 2x/wk x 4 wks then 1x/wk x 4 wks  PT DURATION: 8 weeks  PLANNED INTERVENTIONS: Therapeutic exercises, Therapeutic activity, Neuromuscular re-education, Balance training, Gait training, Patient/Family education, Self Care, Joint mobilization, Stair training, Vestibular training, Canalith repositioning, Orthotic/Fit training, DME instructions, Aquatic Therapy, Dry Needling, Spinal mobilization, Cryotherapy, Moist heat, and Manual therapy  PLAN FOR NEXT SESSION: Continue to progress HEP as able, activities for RLE stride/hip stability. Continue at end of session ambulate to car versus w/c (will likely need to leave some time to accomplish this within appointment period)   Mady Haagensen, PT 06/17/22 7:57 AM Phone: [phone] Fax: [place] at [facility] [place], [place], [place] [address] Phone # [phone] Fax # [phone]

## 2022-06-17 ENCOUNTER — Encounter: Payer: Self-pay | Admitting: Physical Therapy

## 2022-06-18 ENCOUNTER — Ambulatory Visit: Payer: Medicare Other

## 2022-06-18 ENCOUNTER — Other Ambulatory Visit: Payer: Self-pay | Admitting: Cardiology

## 2022-06-18 DIAGNOSIS — R2689 Other abnormalities of gait and mobility: Secondary | ICD-10-CM

## 2022-06-18 DIAGNOSIS — I69351 Hemiplegia and hemiparesis following cerebral infarction affecting right dominant side: Secondary | ICD-10-CM

## 2022-06-18 DIAGNOSIS — R262 Difficulty in walking, not elsewhere classified: Secondary | ICD-10-CM

## 2022-06-18 DIAGNOSIS — M6281 Muscle weakness (generalized): Secondary | ICD-10-CM

## 2022-06-18 DIAGNOSIS — R2681 Unsteadiness on feet: Secondary | ICD-10-CM

## 2022-06-18 NOTE — Therapy (Signed)
OUTPATIENT PHYSICAL THERAPY NEURO TREATMENT   Patient Name: Jose Beck MRN: [id number] DOB:1945/07/03, 77 y.o., male Today's Date: 06/18/2022   PCP: [name], MD REFERRING PROVIDER: [name], MD  END OF SESSION:  PT End of Session - 06/18/22 1317     Visit Number 8    Number of Visits 12    Date for PT Re-Evaluation 07/22/22    Authorization Type [employer]    PT Start Time 1315    PT Stop Time 1400    PT Time Calculation (min) 45 min    Equipment Utilized During Treatment Gait belt    Activity Tolerance Patient tolerated treatment well    Behavior During Therapy WFL for tasks assessed/performed             Past Medical History:  Diagnosis Date   Abdominal aortic aneurysm (AAA) ([place])    Aortic stenosis    a. 11/2015 Echo: EF 55-60%, Gr1 DD, mild AS.   Chronic combined systolic and diastolic CHF (congestive heart failure) ([place])    a. 07/2015: TEE showing EF of 40% b. 11/2015: Echo w/ EF 55-60%, Grade 1 DD, mild AS.   Coronary artery disease    a. patient reports 100% stenosis of LAD and RCA by cath in 1999. b. 11/2015 Cath: LM nl, LAD 100 - fills by L->L collats from D1, LCX 60d, OM1 lalrge/nl, OM2 small/nl, RCA 185m L->R collats.EF 35-45% (55-60% by echo).   Diabetes mellitus without complication ([facility]    Type II   GERD (gastroesophageal reflux disease)    Hyperlipemia    Hypertension    Hypertensive heart disease    Stroke ([facility]    a. 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   Thrombocytopenia ([facility] 11/2015   Past Surgical History:  Procedure Laterality Date   ABDOMINAL AORTIC ENDOVASCULAR STENT GRAFT N/A 11/25/2017   Procedure: ABDOMINAL AORTIC ENDOVASCULAR STENT GRAFT;  Surgeon: BSerafina Mitchell MD;  Location: [facility];  Service: Vascular;  Laterality: N/A;   CARDIAC CATHETERIZATION     CARDIAC CATHETERIZATION N/A 11/16/2015   Procedure: Left Heart Cath and Coronary Angiography;  Surgeon: HBelva Crome MD;  [facility];  Service: Cardiovascular;  Laterality: N/A;   MOLE REMOVAL     TONSILLECTOMY     Patient Active Problem List   Diagnosis Date Noted   Acute systolic heart failure ([facility] 04/27/2022   Atrial fibrillation with tachycardic ventricular rate ([facility] 04/26/2022   Non-ST elevation (NSTEMI) myocardial infarction ([facility] 04/26/2022   Demand ischemia 04/26/2022   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   SAH (subarachnoid hemorrhage) ([facility] 12/19/2021   Cerebrovascular accident ([facility] 01/04/2018   Compression fracture of thoracic vertebra ([facility] 01/04/2018   AAA (abdominal aortic aneurysm) ([facility] 11/25/2017   History of stroke 01/15/2017   Hyperlipidemia LDL goal <70 04/18/2016   Hypertension    Hypertensive heart disease    Chest pain 11/16/2015   Type 2 diabetes mellitus with circulatory disorder, with long-term current use of insulin ([facility] 11/16/2015   Essential hypertension 11/16/2015   Thrombocytopenia ([facility] 11/16/2015   Leukopenia 11/16/2015   Hyponatremia 11/16/2015   Chronic combined systolic and diastolic CHF (congestive heart failure) ([facility] 11/16/2015   CAD in native artery    Brainstem infarct, acute ([facility] [place] 09/17/2015   Occlusion and stenosis of vertebral artery with cerebral infarction ([facility] 09/17/2015   Obesity 09/17/2015   Ataxia, 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   Gait disturbance, 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   History of CVA (cerebrovascular accident) 08/09/2015  ONSET DATE: CVA in 2017. Recent fall on 05/17/22  REFERRING DIAG: I63.9 (ICD-10-CM) - Cerebral infarction, unspecified I69.959 (ICD-10-CM) - Hemiplegia and hemiparesis following unspecified cerebrovascular disease affecting unspecified side R27.0 (ICD-10-CM) - Ataxia, unspecified  THERAPY DIAG:  Muscle weakness (generalized)  Other abnormalities of gait and mobility  Unsteadiness on feet  Difficulty in walking, not elsewhere classified  Hemiplegia and hemiparesis following cerebral infarction affecting right dominant side  (HCC)  Rationale for Evaluation and Treatment: Rehabilitation  SUBJECTIVE:                                                                                                                                                                                             SUBJECTIVE STATEMENT: Having some BP issues today   Pt accompanied by: significant other "[name]"  PERTINENT HISTORY: past medical history of stroke, AAA, type 2 diabetes, hypertension, CAD who presents to the emergency department with fall.  PAIN:  Are you having pain? No  PRECAUTIONS: Fall  WEIGHT BEARING RESTRICTIONS: No  FALLS: Has patient fallen in last 6 months? Yes. Number of falls 5  LIVING ENVIRONMENT: Lives with: lives with their family and lives with their spouse Lives in: House/apartment, ground floor set-up Stairs: Yes, garage stairs into house, will install rail on left side Has following equipment at home: Gilford Rile - 2 wheeled  PLOF: Independent with household mobility with device, Needs assistance with ADLs, and Needs assistance with homemaking. Mostly able to dress self, needs help with Depends. Some assist with shower transfers (4" lip)  PATIENT GOALS:   OBJECTIVE:   TODAY'S TREATMENT: 06/18/22 Activity Comments  Vitals at start 157/73 mmHg, 79 bpm  Standing march 2x10 Red loop  Sidestepping x 2 min Red loop around ankles  Standing hip abd, ext, hamstring curl 2x10 4#  Vitals after exertion 142/83 mmHg, 86 bpm  LAQ 2x10 4#  Alt stair taps 2x10 4# 8" step  Foot on step 4x10 sec  8" step decre UE support in order to improve SLS  Lateral step clearance -BUE support over 6" hurdle 4x, need for micro-adjustments when leading with the RLE -over pool noodle w/ unilat UE 2x--incr anxiety with this    Discussed possibility of switching wheels to outer position of front legs (they currently are in the inner position) for possibility of improved ease of steering.  Patient and wife decline trying this at this  time. TODAY'S TREATMENT: 06/16/2022 Activity Comments  Seated LAQ, marching, ankle pumps 2 x 10 reps For seated warm up activity  Standing resisted sidestepping x 2 minutes, red band C/o dizziness prior to putting on theraband and it resolves  with standing  Vitals:  133/83, HR 86 bpm; O2 94%   Standing resisted step tap to 4" block, red band, 2 x 10 reps Pt reports he's having trouble finding a place to do this at home  Stagger stance forward/back rocking 2 x 10 reps BUE support and cues for techniuqe  Gait training from gym>sidewalk area to car, using RW with min assist Pt able to ambulate with no seated rest breaks, but takes quick, short standing rest breaks, throughout.  He tends to stay to L side of RW and has to move RW to R.  Cues for increased step length and foot clearance.    Car transfer RW>car with min guard        HOME EXERCISE PROGRAM: Access Code: [email or web address] URL: [email or web address] Date: 06/12/2022 Prepared by: Sherlyn Lees  Exercises - Squat with Chair Touch and Resistance Loop  - 1 x daily - 7 x weekly - 3 sets - 10 reps - Side Stepping with Resistance at Thighs and Counter Support  - 1 x daily - 7 x weekly - 3 sets - 2 min hold - Marching with Resistance  - 1 x daily - 7 x weekly - 3 sets - 10 reps ---------------------------------------- From eval: DIAGNOSTIC FINDINGS: CT head and C-spine which were both negative.  COGNITION: Overall cognitive status: Within functional limits for tasks assessed and has some aphasia from hx of CVA   SENSATION: Not tested notes numbness along RUE  COORDINATION: Grossly impaired RUE/RLE Inability to perform rapid alternating movements, heel to shin RLE. Unable to perform finger to nose RUE due to deficits/issues  EDEMA:    MUSCLE TONE: NT   DTRs:  NT  POSTURE: rounded shoulders and forward head  LOWER EXTREMITY ROM:     Active  Right Eval Left Eval  Hip flexion    Hip extension    Hip abduction     Hip adduction    Hip internal rotation    Hip external rotation    Knee flexion    Knee extension    Ankle dorsiflexion 10 15  Ankle plantarflexion    Ankle inversion    Ankle eversion     (Blank rows = not tested)  LOWER EXTREMITY MMT:    Grossly 4/5 BLE  BED MOBILITY:  NT  TRANSFERS: Assistive device utilized: Environmental consultant - 2 wheeled and None  Sit to stand: Complete Independence and Modified independence Stand to sit: Complete Independence and Modified independence Chair to chair: Complete Independence and Modified independence Floor:  NT  RAMP:  NT  CURB:  Level of Assistance: CGA and Min A Assistive device utilized: Environmental consultant - 2 wheeled Curb Comments:   STAIRS: Level of Assistance: Min A Stair Negotiation Technique: Step to Pattern with Bilateral Rails Number of Stairs: 5  Height of Stairs: 4-6"  Comments: right Trendelenberg very prominent with stairs and profound difficulty achieving foot clearance in descending  GAIT: Gait pattern: step to pattern and shuffling Distance walked:  Assistive device utilized: Environmental consultant - 2 wheeled Level of assistance: SBA Comments:   FUNCTIONAL TESTS:  5 times sit to stand: 16.47 sec Timed up and go (TUG): 57.43 sec 10 meter walk test: NT Berg Balance Scale: NT  PATIENT SURVEYS:  N/A     PATIENT EDUCATION: Education details: assessment findings, rationale for PT intervention Person educated: Patient Education method: Explanation Education comprehension: verbalized understanding    GOALS: Goals reviewed with patient? Yes  SHORT TERM GOALS: Target date: 06/24/2022    Patient  will be independent in HEP to improve functional outcomes Baseline: Goal status: MET  2.  Demo reduced risk for falls and improved BLE strength per time 15 sec 5xSTS Baseline: 16 sec Goal status: IN PROGRESS  3.  Improve independence with stair ambulation at SBA-CGA using HR on left with or without AD to improve safety with home  entry/exit Baseline: min A Goal status: IN PROGRESS  LONG TERM GOALS: Target date: 07/22/2022    Demo reduced risk for falls per TUG test 35 sec w/ RW Baseline: 57 sec Goal status: IN PROGRESS  2.  Demo improved efficiency of gait per speed of 2.5 ft/sec 10 meter walk test Baseline: 0.48 ft/sec Goal status: IN PROGRESS  3.  Improve safety with mobility per supervision performance with stair ambulation and curb negotiation Baseline: min A w/ RW Goal status: IN PROGRESS   ASSESSMENT:  CLINICAL IMPRESSION: Pt engaged in activities to promote increased hip strength and single limb support to promote stance stability and progressed to activities on compliant surfaces for ankle strategy and improve postural awareness/stability/proprioception. Pt able to demo improved activity tolerance with fewer rest periods needed and increased resistance used.   OBJECTIVE IMPAIRMENTS: Abnormal gait, decreased activity tolerance, decreased balance, decreased coordination, decreased knowledge of use of DME, decreased mobility, difficulty walking, decreased strength, impaired tone, and impaired UE functional use.   ACTIVITY LIMITATIONS: carrying, lifting, bending, stairs, transfers, reach over head, and locomotion level  PARTICIPATION LIMITATIONS: meal prep, cleaning, laundry, interpersonal relationship, and community activity  PERSONAL FACTORS: Age, Time since onset of injury/illness/exacerbation, and 3+ comorbidities: CVA, prostate CA, compression fx  are also affecting patient's functional outcome.   REHAB POTENTIAL: Good  CLINICAL DECISION MAKING: Evolving/moderate complexity  EVALUATION COMPLEXITY: Moderate  PLAN:  PT FREQUENCY: 1-2x/week, plan is 2x/wk x 4 wks then 1x/wk x 4 wks  PT DURATION: 8 weeks  PLANNED INTERVENTIONS: Therapeutic exercises, Therapeutic activity, Neuromuscular re-education, Balance training, Gait training, Patient/Family education, Self Care, Joint mobilization, Stair  training, Vestibular training, Canalith repositioning, Orthotic/Fit training, DME instructions, Aquatic Therapy, Dry Needling, Spinal mobilization, Cryotherapy, Moist heat, and Manual therapy  PLAN FOR NEXT SESSION: Continue to progress HEP as able, activities for RLE stride/hip stability. Continue at end of session ambulate to car versus w/c (will likely need to leave some time to accomplish this within appointment period)  2:33 PM, 06/18/22 M. Sherlyn Lees, PT, DPT Physical Therapist- [place] Office Number: [phone]   [place] [facility] [address], [place], [place] [address] Phone # [phone] Fax # [phone]

## 2022-06-20 NOTE — Progress Notes (Deleted)
Cardiology Office Note    Date:  06/20/2022   ID:  Jose Beck [birth year], MRN [id number]  PCP:  [name], MD  Cardiologist: Dr. Martinique   No chief complaint on file.    History of Present Illness:    Jose Beck is a 77 y.o. male is seen for post hospital follow up. He has a  past medical history of CAD (s/p known occlusion of LAD and RCA by cath in 1992 with cath in 2017 showing similar findings with collateral flow noted), chronic combined systolic and diastolic CHF, HTN, HLD, Type 2 DM, GERD, and prior CVA.  He had a fall with vertebral fracture in December 2018 managed conservatively. He had some increased garbled speech in March 2019 that resolved. MRI showed multiple chronic strokes but no acute changes. Prior  MRI demonstrated a 4.6 cm AAA. Seen by Dr. Trula Slade and US showed 3.6 cm AAA. CTA June showed increase size to 5.7 cm. He subsequently underwent endovascular repair on 11/25/17.  There was concern for enlarging AAA (from postoperative 5.4 to 6.2 cm and endovascular leak). This is followed by serial CT by [facility].   In June 2022 he dislocated his right shoulder. States since this was on the side of his stroke surgery was not recommended. Notes his PSA has been high.  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   In 12/2021 admitted for evaluation of fall, possible syncope. Patient had a mechanical fall, and he hit his head on the door handle. Unclear if he had a syncopal episode prior to fall. 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 As It was unclear whether patient had syncope, and as echo had normal EF, no further cardiac workup was pursued. Note that patient did have a traumatic acute subarachnoid hemorrhage and acute subdural hematoma. Seen by  neurosurgery who recommended prophylactic Keppra for seizure prophylaxis. No surgical intervention planned   He was admitted 12/16-12/18/23 with  acute chest pain. Was in AFib with RVR. His troponins were elevated to 1185. He converted to NSR on IV amiodarone and was transitioned to PO. Echo showed EF 45-50% with anterior and apical WMA. These findings were felt to be related to demand ischemia given known CAD and rapid AFib. Patient deferred cardiac cath. [place] Vasc score of 7. Recommended anticoagulation with Eliquis. CT head negative for any hemorrhage. Given high bleeding risk suggested taking DOAC alone without antiplatelet therapy. Plavix was stopped. Discussed starting on Farxiga and entresto but patient resistant to taking new meds. He was DC on losartan and [place] but later noted issues and Wilder Glade was held. Was taken off ARB when seen by PCP on 12/30. Has been complaining of feeling weak, tremulous, jittery. Can't walk on his own. Prone to falling. Is scheduled to start PT next week. Family reports BP has been well controlled.   Past Medical History:  Diagnosis Date   Abdominal aortic aneurysm (AAA) ([place])    Aortic stenosis    a. 11/2015 Echo: EF 55-60%, Gr1 DD, mild AS.   Chronic combined systolic and diastolic CHF (congestive heart failure) ([place])    a. 07/2015: TEE showing EF of 40% b. 11/2015: Echo w/ EF 55-60%, Grade 1 DD, mild AS.   Coronary artery disease    a. patient reports 100% stenosis of LAD and RCA  by cath in 1999. b. 11/2015 Cath: LM nl, LAD 100 - fills by L->L collats from D1, LCX 60d, OM1 lalrge/nl, OM2 small/nl, RCA 126m L->R collats.EF 35-45% (55-60% by echo).   Diabetes mellitus without complication [place]    Type II   GERD (gastroesophageal reflux disease)    Hyperlipemia    Hypertension    Hypertensive heart disease    Stroke ([facility]    a. 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   Thrombocytopenia ([facility] 11/2015    Past Surgical History:  Procedure Laterality Date    ABDOMINAL AORTIC ENDOVASCULAR STENT GRAFT N/A 11/25/2017   Procedure: ABDOMINAL AORTIC ENDOVASCULAR STENT GRAFT;  Surgeon: BSerafina Mitchell MD;  Location: [facility];  Service: Vascular;  Laterality: N/A;   CARDIAC CATHETERIZATION     CARDIAC CATHETERIZATION N/A 11/16/2015   Procedure: Left Heart Cath and Coronary Angiography;  Surgeon: HBelva Crome MD;  [facility];  Service: Cardiovascular;  Laterality: N/A;   MOLE REMOVAL     TONSILLECTOMY      Current Medications: Outpatient Medications Prior to Visit  Medication Sig Dispense Refill   acetaminophen (TYLENOL) 650 MG CR tablet Take 650 mg by mouth in the morning, at noon, and at bedtime.     apixaban (ELIQUIS) 5 MG TABS tablet TAKE 1 TABLET BY MOUTH TWICE A DAY 60 tablet 5   atorvastatin (LIPITOR) 80 MG tablet Take 1 tablet (80 mg total) by mouth daily at 6 PM. 30 tablet 6   calcium-vitamin D 250-100 MG-UNIT tablet Take 1 tablet by mouth daily. Takes in morning 6075m    insulin detemir (LEVEMIR) 100 UNIT/ML injection Inject 10 Units into the skin daily.     metoprolol succinate (TOPROL-XL) 25 MG 24 hr tablet Take 1 tablet (25 mg total) by mouth daily. PLEASE KEEP SCHEDULED APPOINTMENT WITH CARDIOLOGIST 60 tablet 0   nitroGLYCERIN (NITROSTAT) 0.4 MG SL tablet Place 0.4 mg under the tongue every 5 (five) minutes as needed for chest pain.     tacrolimus (PROTOPIC) 0.1 % ointment Apply 1 application topically daily as needed (facial scaling).     No facility-administered medications prior to visit.     Allergies:   Cheese and Pravastatin   Social History   Socioeconomic History   Marital status: Married    Spouse name: JuBethena Roys Number of children: 0   Years of education: College   Highest education level: Not on file  Occupational History   Occupation: Retired   Tobacco Use   Smoking status: Never   Smokeless tobacco: Never  Vaping Use   Vaping Use: Never used  Substance and Sexual Activity   Alcohol use: Yes     Alcohol/week: 1.0 standard drink of alcohol    Types: 1 Shots of liquor per week    Comment: cocktails every day   Drug use: No   Sexual activity: Not on file  Other Topics Concern   Not on file  Social History Narrative   Drinks coffee daily    Social Determinants of Health   Financial Resource Strain: Not on file  Food Insecurity: No Food Insecurity (04/26/2022)   Hunger Vital Sign    Worried About Running Out of Food in the Last Year: Never true    Ran Out of Food in the Last Year: Never true  Transportation Needs: No Transportation Needs (04/26/2022)   PRAPARE - TrHydrologistMedical): No    Lack of Transportation (  Non-Medical): No  Physical Activity: Not on file  Stress: Not on file  Social Connections: Not on file     Family History:  The patient's family history includes Heart disease in his mother; Hypertension in his mother.   Review of Systems:   Please see the history of present illness.     All other systems reviewed and are otherwise negative except as noted above.   Physical Exam:    VS:  There were no vitals taken for this visit.   GENERAL:  Well appearing obese WM in NAD. Walks with cane.  HEENT:  PERRL, EOMI, sclera are clear. Oropharynx is clear. NECK:  No jugular venous distention, carotid upstroke brisk and symmetric, no bruits, no thyromegaly or adenopathy LUNGS:  Clear to auscultation bilaterally CHEST:  Unremarkable HEART:  RRR,  PMI not displaced or sustained,S1 and S2 within normal limits, no S3, no S4: soft 2/6 systolic murmur LSB>> RUSB ABD:  Soft, nontender. BS +, no masses or bruits. No hepatomegaly, no splenomegaly EXT:  2 + pulses throughout, no edema, no cyanosis no clubbing SKIN:  Warm and dry.  No rashes NEURO:  Alert and oriented x 3. Cranial nerves II through XII intact. PSYCH:  Cognitively intact       Wt Readings from Last 3 Encounters:  05/28/22 206 lb (93.4 kg)  05/23/22 206 lb (93.4 kg)   04/29/22 212 lb 11.9 oz (96.5 kg)     Studies/Labs Reviewed:   EKG:  EKG is not ordered today.    Recent Labs: 04/26/2022: ALT 15; Magnesium 1.9 04/27/2022: TSH 1.364 04/29/2022: B Natriuretic Peptide 314.7 05/17/2022: BUN 26; Creatinine, Ser 1.31; Hemoglobin 12.8; Platelets 94; Potassium 4.0; Sodium 136   Lipid Panel    Component Value Date/Time   CHOL 127 04/28/2022 0431   TRIG 59 04/28/2022 0431   HDL 55 04/28/2022 0431   CHOLHDL 2.3 04/28/2022 0431   VLDL 12 04/28/2022 0431   LDLCALC 60 04/28/2022 0431   Labs dated 01/21/17: cholesterol 144, triglycerides 69, HDL 54, LDL 76. Dated 10/07/17: A1c 5.2%.  Dated 11/26/17: plts 94K. Hgb 13.4. Chemistries normal. A1c 5.7% Dated 06/29/19: A1c 5.4%. Dated 07/26/19: cholesterol 139, triglycerides 55, HDL 59, LDL 69. Creatinine 1.1. plts 94K. Otherwise CMET, CBC, TSH normal.  Dated 11/07/19: A1c 5.4% Dated 08/06/20: cholesterol 132, triglycerides 44, HDL 64, LDL 59. A1c 5.7%. creatinine 1.1. Otherwise CMET and TSH normal. dated 05/20/21 CMET and TSH normal  Additional studies/ records that were reviewed today include:   Cardiac Catheterization: 11/2015 Mid RCA lesion, 100% stenosed. Ost 1st Diag to 1st Diag lesion, 70% stenosed. Mid LAD lesion, 100% stenosed. Dist RCA lesion, 100% stenosed. Dist Cx lesion, 60% stenosed.   Total occlusion of the proximal RCA. RCA is heavily calcified. RCA fills by collaterals from the circumflex coronary of the left system. The right coronary is large in distribution. Total occlusion of the proximal to mid LAD after the origin of the first of perforator and first diagonal. Left to left collateral supply the LAD and a second diagonal. The first diagonal contains eccentric 50-70% narrowing. Widely patent circumflex including a small ramus intermedius/first obtuse marginal, and a large branching second obtuse marginal. The distal circumflex beyond the second marginal is small and contains 50-70% narrowing.  The circumflex is the source of collaterals to the distal right coronary. Mildly depressed LV function with estimated EF in the 35 to 40% range. LVEDP is mildly elevated. The inferior wall is hypokinetic.     RECOMMENDATIONS:  Images were reviewed with Dr. Martinique. The plan at this time is to continue medical therapy unless symptoms progress. Aggressive risk factor modification. Heart failure therapy as indicated.  Echocardiogram: 11/2015 Study Conclusions   - Left ventricle: The cavity size was normal. Systolic function was   normal. The estimated ejection fraction was in the range of 55%   to 60%. Wall motion was normal; there were no regional wall   motion abnormalities. There was an increased relative   contribution of atrial contraction to ventricular filling.   Doppler parameters are consistent with abnormal left ventricular   relaxation (grade 1 diastolic dysfunction). - Aortic valve: Moderately calcified annulus. Trileaflet. Moderate   diffuse thickening and calcification. There was mild stenosis.   Valve area (VTI): 2.17 cm^2. Valve area (Vmax): 1.88 cm^2. Valve   area (Vmean): 1.75 cm^2.  Echo 04/28/22: IMPRESSIONS     1. Left ventricular ejection fraction, by estimation, is 45 to 50%. The  left ventricle has mildly decreased function. The left ventricle  demonstrates regional wall motion abnormalities (see scoring  diagram/findings for description). There is mild  concentric left ventricular hypertrophy. Left ventricular diastolic  parameters are consistent with Grade I diastolic dysfunction (impaired  relaxation).   2. Right ventricular systolic function is normal. The right ventricular  size is normal. Tricuspid regurgitation signal is inadequate for assessing  PA pressure.   3. The mitral valve is grossly normal. Trivial mitral valve  regurgitation. No evidence of mitral stenosis.   4. The aortic valve is calcified.   5. The inferior vena cava is normal in size  with greater than 50%  respiratory variability, suggesting right atrial pressure of 3 mmHg.   Comparison(s): Changes from prior study are noted. The left ventricular  function has improved.  Assessment:    No diagnosis found.      Plan:   In order of problems listed above:  1. CAD - the patient has known occlusion of the LAD and RCA by cath in 1992 with repeat cath in 2017 showing similar findings with collateral flow  - recent chest pain and elevated troponin in setting of AFib with RVR. Felt to likely be demand ischemia. Patient deferred cardiac cath.  - he denies any chest pain or dyspnea on exertion.  - continue BB, and statin therapy. - Plavix discontinued in setting of anticoagulation   2. Chronic Combined Systolic and Diastolic CHF - Echo in 0000000 showed a EF 55-60% by echo at that time.  - no evidence of volume overload.  - recent EF dropped to 45-50%. Likely rate related with Afib.  - intolerance of ARB/Entresto with worsened renal function. Will hold Wilder Glade for now.  - on Toprol XL  - plan on repeating echo in 1-2 months.   3. HTN - BP well controlled   4. HLD -  Last LDL 60.  -  Remains on high-dose Atorvastatin 18m daily.  5. Type 2 DM -  Followed by PCP.   6. AAA -  S/p endovascular repair in July 2019. Followed by Dr BTrula Slade UKoreain Feb 2022 was stable with no endoleak.  7. History of CVA.   8.  Afib with RVR. Converted to NSR on amiodarone. HIgh CMaliVasc score. Now on Eliquis.  Family is concerned about side effects of amiodarone and feels his weakness/tremor are related to this. Will discontinue amiodarone. Continue Toprol and eliquis. If Afib recurs other option would be Tikosyn. Will monitor.       SArie Sabina  Martinique, MD  06/20/2022 12:56 PM    [facility] [address] [place], [place] [address] Phone: [phone]

## 2022-06-23 ENCOUNTER — Ambulatory Visit: Payer: Medicare Other

## 2022-06-23 DIAGNOSIS — M6281 Muscle weakness (generalized): Secondary | ICD-10-CM

## 2022-06-23 DIAGNOSIS — I69351 Hemiplegia and hemiparesis following cerebral infarction affecting right dominant side: Secondary | ICD-10-CM

## 2022-06-23 DIAGNOSIS — R262 Difficulty in walking, not elsewhere classified: Secondary | ICD-10-CM | POA: Diagnosis not present

## 2022-06-23 DIAGNOSIS — R2681 Unsteadiness on feet: Secondary | ICD-10-CM

## 2022-06-23 DIAGNOSIS — R2689 Other abnormalities of gait and mobility: Secondary | ICD-10-CM

## 2022-06-23 NOTE — Therapy (Signed)
OUTPATIENT PHYSICAL THERAPY NEURO TREATMENT, Progress Note, Recertification   Patient Name: Jose Beck MRN: [id number] DOB:04-15-1946, 77 y.o., male Today's Date: 06/23/2022   PCP: [name], MD REFERRING PROVIDER: [name], MD  Progress Note Reporting Period 05/27/22 to 06/23/22  See note below for Objective Data and Assessment of Progress/Goals.     END OF SESSION:  PT End of Session - 06/23/22 1404     Visit Number 9    Number of Visits 19    Date for PT Re-Evaluation 07/28/22    Authorization Type [employer]    Progress Note Due on Visit 93    PT Start Time 1400    PT Stop Time 1445    PT Time Calculation (min) 45 min    Equipment Utilized During Treatment Gait belt    Activity Tolerance Patient tolerated treatment well    Behavior During Therapy WFL for tasks assessed/performed             Past Medical History:  Diagnosis Date   Abdominal aortic aneurysm (AAA) ([place])    Aortic stenosis    a. 11/2015 Echo: EF 55-60%, Gr1 DD, mild AS.   Chronic combined systolic and diastolic CHF (congestive heart failure) ([place])    a. 07/2015: TEE showing EF of 40% b. 11/2015: Echo w/ EF 55-60%, Grade 1 DD, mild AS.   Coronary artery disease    a. patient reports 100% stenosis of LAD and RCA by cath in 1999. b. 11/2015 Cath: LM nl, LAD 100 - fills by L->L collats from D1, LCX 60d, OM1 lalrge/nl, OM2 small/nl, RCA 165m L->R collats.EF 35-45% (55-60% by echo).   Diabetes mellitus without complication ([facility]    Type II   GERD (gastroesophageal reflux disease)    Hyperlipemia    Hypertension    Hypertensive heart disease    Stroke ([facility]    a. 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   Thrombocytopenia ([facility] 11/2015   Past Surgical History:  Procedure Laterality Date   ABDOMINAL AORTIC ENDOVASCULAR STENT GRAFT N/A 11/25/2017   Procedure: ABDOMINAL AORTIC ENDOVASCULAR STENT GRAFT;  Surgeon: BSerafina Mitchell MD;  Location: [facility];  Service:  Vascular;  Laterality: N/A;   CARDIAC CATHETERIZATION     CARDIAC CATHETERIZATION N/A 11/16/2015   Procedure: Left Heart Cath and Coronary Angiography;  Surgeon: HBelva Crome MD;  [facility];  Service: Cardiovascular;  Laterality: N/A;   MOLE REMOVAL     TONSILLECTOMY     Patient Active Problem List   Diagnosis Date Noted   Acute systolic heart failure ([facility] 04/27/2022   Atrial fibrillation with tachycardic ventricular rate ([facility] 04/26/2022   Non-ST elevation (NSTEMI) myocardial infarction ([facility] [place] 04/26/2022   Demand ischemia 04/26/2022   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   SAH (subarachnoid hemorrhage) ([facility] 12/19/2021   Cerebrovascular accident ([facility] 01/04/2018   Compression fracture of thoracic vertebra ([facility] 01/04/2018   AAA (abdominal aortic aneurysm) ([facility] 11/25/2017   History of stroke 01/15/2017   Hyperlipidemia LDL goal <70 04/18/2016   Hypertension    Hypertensive heart disease    Chest pain 11/16/2015   Type 2 diabetes mellitus with circulatory disorder, with long-term current use of insulin ([facility] 11/16/2015   Essential hypertension 11/16/2015   Thrombocytopenia ([facility] 11/16/2015   Leukopenia 11/16/2015   Hyponatremia 11/16/2015   Chronic combined systolic and diastolic CHF (congestive heart failure) ([facility] 11/16/2015   CAD in native artery    Brainstem infarct, acute ([facility] 09/17/2015   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 Recent fall on 05/17/22  REFERRING DIAG: I63.9 (ICD-10-CM) - Cerebral infarction, unspecified I69.959 (ICD-10-CM) - Hemiplegia and hemiparesis following unspecified cerebrovascular disease affecting unspecified side R27.0 (ICD-10-CM) - Ataxia, unspecified  THERAPY DIAG:  Muscle weakness (generalized)  Other  abnormalities of gait and mobility  Unsteadiness on feet  Difficulty in walking, not elsewhere classified  Hemiplegia and hemiparesis following cerebral infarction affecting right dominant side (HCC)  Rationale for Evaluation and Treatment: Rehabilitation  SUBJECTIVE:                                                                                                                                                                                             SUBJECTIVE STATEMENT: BP has been doing better   Pt accompanied by: significant other "[name]"  PERTINENT HISTORY: past medical history of stroke, AAA, type 2 diabetes, hypertension, CAD who presents to the emergency department with fall.  PAIN:  Are you having pain? No  PRECAUTIONS: Fall  WEIGHT BEARING RESTRICTIONS: No  FALLS: Has patient fallen in last 6 months? Yes. Number of falls 5  LIVING ENVIRONMENT: Lives with: lives with their family and lives with their spouse Lives in: House/apartment, ground floor set-up Stairs: Yes, garage stairs into house, will install rail on left side Has following equipment at home: Gilford Rile - 2 wheeled  PLOF: Independent with household mobility with device, Needs assistance with ADLs, and Needs assistance with homemaking. Mostly able to dress self, needs help with Depends. Some assist with shower transfers (4" lip)  PATIENT GOALS:   OBJECTIVE:   TODAY'S TREATMENT: 06/24/22 Activity Comments  5xSTS 13.75 sec, minimal LUE use  TUG test 35.37 sec w/ RW  Stair ambulation W/ HR on left, ascend w/ step-to and descend retro/sideways w/ HR on Left with CGA  Gait training -w/ RW on level surfaces for stride length -w/ single point cane and CGA-min A -sidestepping x 2 min w/ 4# for SLS -Gait with RW at end of session x 20 ft  PRE 3x10 4# -LAQ  -hamstring curls -hip abd       TODAY'S TREATMENT: 06/18/22 Activity Comments  Vitals at start 157/73 mmHg, 79 bpm  Standing march 2x10 Red loop   Sidestepping x 2 min Red loop around ankles  Standing hip abd, ext, hamstring curl 2x10 4#  Vitals after exertion 142/83 mmHg, 86 bpm  LAQ 2x10 4#  Alt stair taps  2x10 4# 8" step  Foot on step 4x10 sec  8" step decre UE support in order to improve SLS  Lateral step clearance -BUE support over 6" hurdle 4x, need for micro-adjustments when leading with the RLE -over pool noodle w/ unilat UE 2x--incr anxiety with this       HOME EXERCISE PROGRAM: Access Code: [email or web address] URL: [email or web address] Date: 06/12/2022 Prepared by: Sherlyn Lees  Exercises - Squat with Chair Touch and Resistance Loop  - 1 x daily - 7 x weekly - 3 sets - 10 reps - Side Stepping with Resistance at Thighs and Counter Support  - 1 x daily - 7 x weekly - 3 sets - 2 min hold - Marching with Resistance  - 1 x daily - 7 x weekly - 3 sets - 10 reps ---------------------------------------- From eval: DIAGNOSTIC FINDINGS: CT head and C-spine which were both negative.  COGNITION: Overall cognitive status: Within functional limits for tasks assessed and has some aphasia from hx of CVA   SENSATION: Not tested notes numbness along RUE  COORDINATION: Grossly impaired RUE/RLE Inability to perform rapid alternating movements, heel to shin RLE. Unable to perform finger to nose RUE due to deficits/issues  EDEMA:    MUSCLE TONE: NT   DTRs:  NT  POSTURE: rounded shoulders and forward head  LOWER EXTREMITY ROM:     Active  Right Eval Left Eval  Hip flexion    Hip extension    Hip abduction    Hip adduction    Hip internal rotation    Hip external rotation    Knee flexion    Knee extension    Ankle dorsiflexion 10 15  Ankle plantarflexion    Ankle inversion    Ankle eversion     (Blank rows = not tested)  LOWER EXTREMITY MMT:    Grossly 4/5 BLE  BED MOBILITY:  NT  TRANSFERS: Assistive device utilized: Environmental consultant - 2 wheeled and None  Sit to stand: Complete Independence and Modified  independence Stand to sit: Complete Independence and Modified independence Chair to chair: Complete Independence and Modified independence Floor:  NT  RAMP:  NT  CURB:  Level of Assistance: CGA and Min A Assistive device utilized: Environmental consultant - 2 wheeled Curb Comments:   STAIRS: Level of Assistance: Min A Stair Negotiation Technique: Step to Pattern with Bilateral Rails Number of Stairs: 5  Height of Stairs: 4-6"  Comments: right Trendelenberg very prominent with stairs and profound difficulty achieving foot clearance in descending  GAIT: Gait pattern: step to pattern and shuffling Distance walked:  Assistive device utilized: Environmental consultant - 2 wheeled Level of assistance: SBA Comments:   FUNCTIONAL TESTS:  5 times sit to stand: 16.47 sec Timed up and go (TUG): 57.43 sec 10 meter walk test: NT Berg Balance Scale: NT  PATIENT SURVEYS:  N/A     PATIENT EDUCATION: Education details: assessment findings, rationale for PT intervention Person educated: Patient Education method: Explanation Education comprehension: verbalized understanding    GOALS: Goals reviewed with patient? Yes  SHORT TERM GOALS: Target date: 06/24/2022    Patient will be independent in HEP to improve functional outcomes Baseline: Goal status: MET  2.  Demo reduced risk for falls and improved BLE strength per time 15 sec 5xSTS Baseline: 16 sec; (06/23/22) 13.75 sec minimal LUE use Goal status: MET  3.  Improve independence with stair ambulation at SBA-CGA using HR on left with or without AD to improve safety with home entry/exit Baseline: min A; (06/23/22) SBA-CGA  Goal status: MET  4. Patient to be independent in advanced HEP to include strength, balance, and ambulation bouts.  Baseline:  Goal status: INITIAL  LONG TERM GOALS: Target date: 07/28/2022   Demo reduced risk for falls per TUG test 35 sec w/ RW Baseline: 57 sec; (06/23/22) 35 sec Goal status: REVISED  2.  Demo improved efficiency of  gait per speed of 2.5 ft/sec 10 meter walk test Baseline: 0.48 ft/sec Goal status: IN PROGRESS  3.  Improve safety with mobility per supervision performance with stair ambulation and curb negotiation Baseline: min A w/ RW; (06/23/22) SBA-CGA Goal status: IN PROGRESS  4. Report/demonstrate ability to ambulate household distances w/ cane to meet patient goal  Baseline: unable  Goal status: INITIAL   ASSESSMENT:  CLINICAL IMPRESSION: Pt demonstrates excellent progress with POC details meeting initial STG and demonstrating greater degree of independence with functional mobility and improved BLE strength and reduced risk for falls per performance on 5xSTS test of 13 sec from previous 16 sec.  Greatly improved time for TUG test using RW from previous 57 sec to 35 sec.  Pt notes overall feeling stronger in BLE with daily performance of HEP.  Pt would benefit from continued sessions to advance LTG and improve gait speed/efficiency and now to perform training w/ cane per pt selected goal.  Initial trial with cane reveals poor proximal right hip stability and control w/ ipsilateral pelvic drop and limited step length as result  OBJECTIVE IMPAIRMENTS: Abnormal gait, decreased activity tolerance, decreased balance, decreased coordination, decreased knowledge of use of DME, decreased mobility, difficulty walking, decreased strength, impaired tone, and impaired UE functional use.   ACTIVITY LIMITATIONS: carrying, lifting, bending, stairs, transfers, reach over head, and locomotion level  PARTICIPATION LIMITATIONS: meal prep, cleaning, laundry, interpersonal relationship, and community activity  PERSONAL FACTORS: Age, Time since onset of injury/illness/exacerbation, and 3+ comorbidities: CVA, prostate CA, compression fx  are also affecting patient's functional outcome.   REHAB POTENTIAL: Good  CLINICAL DECISION MAKING: Evolving/moderate complexity  EVALUATION COMPLEXITY: Moderate  PLAN:  PT  FREQUENCY: 2x/wk x 5 wks  PT DURATION: other: 5 wks--10 sessions  PLANNED INTERVENTIONS: Therapeutic exercises, Therapeutic activity, Neuromuscular re-education, Balance training, Gait training, Patient/Family education, Self Care, Joint mobilization, Stair training, Vestibular training, Canalith repositioning, Orthotic/Fit training, DME instructions, Aquatic Therapy, Dry Needling, Spinal mobilization, Cryotherapy, Moist heat, and Manual therapy  PLAN FOR NEXT SESSION: Gait speed (w/ RW and cane for comparison); for HEP add LE open chain strength with ankle weights (spouse is getting 5 lbs pair)--rec standing hip abd, hamstring curls, LAQ  3:11 PM, 06/23/22 M. Sherlyn Lees, PT, DPT Physical Therapist- [place] Office Number: [phone]   [facility] [address], [place], [place] [address] Phone # [phone] Fax # [phone]

## 2022-06-25 ENCOUNTER — Ambulatory Visit: Payer: Medicare Other

## 2022-06-25 DIAGNOSIS — R2681 Unsteadiness on feet: Secondary | ICD-10-CM

## 2022-06-25 DIAGNOSIS — R262 Difficulty in walking, not elsewhere classified: Secondary | ICD-10-CM

## 2022-06-25 DIAGNOSIS — R2689 Other abnormalities of gait and mobility: Secondary | ICD-10-CM

## 2022-06-25 DIAGNOSIS — M6281 Muscle weakness (generalized): Secondary | ICD-10-CM

## 2022-06-25 NOTE — Therapy (Signed)
OUTPATIENT PHYSICAL THERAPY NEURO TREATMENT   Patient Name: Jose Beck MRN: [id number] DOB:April 21, 1946, 77 y.o., male Today's Date: 06/25/2022   PCP: [name], MD REFERRING PROVIDER: [name], MD    END OF SESSION:  PT End of Session - 06/25/22 1319     Visit Number 10    Number of Visits 19    Date for PT Re-Evaluation 07/28/22    Authorization Type [employer]    Progress Note Due on Visit 38    PT Start Time 1315    PT Stop Time 1400    PT Time Calculation (min) 45 min    Equipment Utilized During Treatment Gait belt    Activity Tolerance Patient tolerated treatment well    Behavior During Therapy WFL for tasks assessed/performed             Past Medical History:  Diagnosis Date   Abdominal aortic aneurysm (AAA) ([place])    Aortic stenosis    a. 11/2015 Echo: EF 55-60%, Gr1 DD, mild AS.   Chronic combined systolic and diastolic CHF (congestive heart failure) ([place])    a. 07/2015: TEE showing EF of 40% b. 11/2015: Echo w/ EF 55-60%, Grade 1 DD, mild AS.   Coronary artery disease    a. patient reports 100% stenosis of LAD and RCA by cath in 1999. b. 11/2015 Cath: LM nl, LAD 100 - fills by L->L collats from D1, LCX 60d, OM1 lalrge/nl, OM2 small/nl, RCA 164m L->R collats.EF 35-45% (55-60% by echo).   Diabetes mellitus without complication ([place]    Type II   GERD (gastroesophageal reflux disease)    Hyperlipemia    Hypertension    Hypertensive heart disease    Stroke ([place]    a. 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   Thrombocytopenia ([place] 11/2015   Past Surgical History:  Procedure Laterality Date   ABDOMINAL AORTIC ENDOVASCULAR STENT GRAFT N/A 11/25/2017   Procedure: ABDOMINAL AORTIC ENDOVASCULAR STENT GRAFT;  Surgeon: BSerafina Mitchell MD;  Location: [facility];  Service: Vascular;  Laterality: N/A;   CARDIAC CATHETERIZATION     CARDIAC CATHETERIZATION N/A 11/16/2015   Procedure: Left Heart Cath and Coronary Angiography;   Surgeon: HBelva Crome MD;  Location: [facility];  Service: Cardiovascular;  Laterality: N/A;   MOLE REMOVAL     TONSILLECTOMY     Patient Active Problem List   Diagnosis Date Noted   Acute systolic heart failure ([facility] 04/27/2022   Atrial fibrillation with tachycardic ventricular rate ([facility] 04/26/2022   Non-ST elevation (NSTEMI) myocardial infarction ([place] 04/26/2022   Demand ischemia 04/26/2022   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   SAH (subarachnoid hemorrhage) ([place] 12/19/2021   Cerebrovascular accident ([facility] 01/04/2018   Compression fracture of thoracic vertebra ([place] 01/04/2018   AAA (abdominal aortic aneurysm) ([place] 11/25/2017   History of stroke 01/15/2017   Hyperlipidemia LDL goal <70 04/18/2016   Hypertension    Hypertensive heart disease    Chest pain 11/16/2015   Type 2 diabetes mellitus with circulatory disorder, with long-term current use of insulin ([facility] 11/16/2015   Essential hypertension 11/16/2015   Thrombocytopenia ([facility] 11/16/2015   Leukopenia 11/16/2015   Hyponatremia 11/16/2015   Chronic combined systolic and diastolic CHF (congestive heart failure) ([place] 11/16/2015   CAD in native artery    Brainstem infarct, acute ([facility] [place] 09/17/2015   Occlusion 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     Pt accompanied by: significant other "[name]"  PERTINENT HISTORY: past medical history of stroke, AAA, type 2 diabetes, hypertension, CAD who presents to the emergency department with fall.  PAIN:  Are you having pain? No  PRECAUTIONS: Fall  WEIGHT BEARING RESTRICTIONS: No  FALLS: Has patient fallen in last 6 months? Yes. Number of falls 5  LIVING ENVIRONMENT: Lives with: lives with their family and lives with their spouse Lives in: House/apartment, ground floor set-up Stairs: Yes, garage stairs into house, will install rail on left side Has following equipment at home: Gilford Rile - 2 wheeled  PLOF: Independent with household mobility with device, Needs assistance with ADLs, and Needs assistance with homemaking. Mostly able to dress self, needs help with Depends. Some assist with shower transfers (4" lip)  PATIENT GOALS:   OBJECTIVE:   TODAY'S TREATMENT: 06/25/22 Activity Comments  LAQ 3x10 5#  Sidestepping x 2 min 5#  Standing hip abd 2x10 5#- added to HEP  Standing hamstring curls 2x10 5#- added to HEP  Minisquats at sink 2x10 Green loop around knees  Seated rows 3x10 25# LUE only  Standing low pull 2x10 20# LUE only  Gait training -pre-gait in parallel bars for limb advancement -gait train with cane and adjustments to promote weight bearing on device and step length w/ CGA x 75 ft -additional trials w/ RW for stride length     TODAY'S TREATMENT: 06/24/22 Activity Comments  5xSTS 13.75 sec, minimal LUE use  TUG test 35.37 sec w/ RW  Stair ambulation W/ HR on left, ascend w/ step-to and descend retro/sideways w/ HR on Left with CGA  Gait training -w/ RW on level surfaces for stride length -w/ single point cane and CGA-min A -sidestepping x 2 min w/ 4# for SLS -Gait with RW at end of session x 20 ft  PRE 3x10 4# -LAQ  -hamstring curls -hip abd             HOME EXERCISE  PROGRAM: Access Code: [email or web address] URL: [email or web address] Date: 06/12/2022 Prepared by: Sherlyn Lees  Exercises - Squat with Chair Touch and Resistance Loop  - 1 x daily - 7 x weekly - 3 sets - 10 reps - Side Stepping with Resistance at Thighs and Counter Support  - 1 x daily - 7 x weekly - 3 sets - 2 min hold - Marching with Resistance  - 1 x daily - 7 x weekly - 3 sets - 10 reps - Seated Long Arc Quad with Ankle Weight  - 1 x daily - 7 x weekly - 3 sets - 10 reps - Standing Hip Abduction with Ankle Weight  - 1 x daily - 7 x weekly - 3 sets - 10 reps - Standing Alternating Knee Flexion with Ankle Weights  - 1 x daily - 7 x weekly - 3 sets - 10 reps ---------------------------------------- From eval: DIAGNOSTIC FINDINGS: CT head and C-spine which were both negative.  COGNITION: Overall cognitive status: Within functional limits for tasks assessed and has some aphasia from hx of CVA   SENSATION: Not tested notes numbness along RUE  COORDINATION: Grossly impaired RUE/RLE Inability to perform rapid alternating movements, heel to shin RLE. Unable to perform finger to nose RUE due to deficits/issues  EDEMA:    MUSCLE TONE: NT   DTRs:  NT  POSTURE: rounded shoulders and forward head  LOWER EXTREMITY ROM:     Active  Right Eval Left Eval  Hip flexion    Hip extension    Hip abduction    Hip adduction    Hip internal rotation    Hip external rotation    Knee flexion    Knee extension    Ankle dorsiflexion 10 15  Ankle plantarflexion    Ankle inversion    Ankle eversion     (Blank rows = not tested)  LOWER EXTREMITY MMT:    Grossly 4/5 BLE  BED MOBILITY:  NT  TRANSFERS: Assistive device utilized: Environmental consultant - 2 wheeled and None  Sit to stand: Complete Independence and Modified independence Stand to sit: Complete Independence and Modified independence Chair to chair: Complete Independence and Modified independence Floor:  NT  RAMP:  NT  CURB:   Level of Assistance: CGA and Min A Assistive device utilized: Environmental consultant - 2 wheeled Curb Comments:   STAIRS: Level of Assistance: Min A Stair Negotiation Technique: Step to Pattern with Bilateral Rails Number of Stairs: 5  Height of Stairs: 4-6"  Comments: right Trendelenberg very prominent with stairs and profound difficulty achieving foot clearance in descending  GAIT: Gait pattern: step to pattern and shuffling Distance walked:  Assistive device utilized: Environmental consultant - 2 wheeled Level of assistance: SBA Comments:   FUNCTIONAL TESTS:  5 times sit to stand: 16.47 sec Timed up and go (TUG): 57.43 sec 10 meter walk test: NT Berg Balance Scale: NT  PATIENT SURVEYS:  N/A     PATIENT EDUCATION: Education details: assessment findings, rationale for PT intervention Person educated: Patient Education method: Explanation Education comprehension: verbalized understanding    GOALS: Goals reviewed with patient? Yes  SHORT TERM GOALS: Target date: 06/24/2022    Patient will be independent in HEP to improve functional outcomes Baseline: Goal status: MET  2.  Demo reduced risk for falls and improved BLE strength per time 15 sec 5xSTS Baseline: 16 sec; (06/23/22) 13.75 sec minimal LUE use Goal status: MET  3.  Improve independence with stair ambulation at SBA-CGA using HR on left with or without AD to improve safety with home entry/exit Baseline: min A; (06/23/22) SBA-CGA Goal status: MET  4. Patient to be independent in advanced HEP to include strength, balance, and ambulation bouts.  Baseline:  Goal status: INITIAL  LONG TERM GOALS: Target date: 07/28/2022   Demo reduced risk for falls per TUG test 35 sec w/ RW Baseline: 57 sec; (06/23/22) 35 sec Goal status: REVISED  2.  Demo improved efficiency of gait per speed of 2.5 ft/sec 10 meter walk test Baseline: 0.48 ft/sec Goal status: IN PROGRESS  3.  Improve safety with mobility per supervision performance with stair  ambulation and curb negotiation Baseline: min A w/ RW; (06/23/22) SBA-CGA Goal status: IN PROGRESS  4. Report/demonstrate ability to ambulate household distances w/ cane to meet patient goal  Baseline: unable  Goal status: INITIAL   ASSESSMENT:  CLINICAL IMPRESSION: Continued with focus on open chain PRE using increased 5# resistance to prepare for home program carryover and able  to accomplish partial ROM with this degree of resistance and instance of compensatory movements due to weight, cues for slowed pace and focus on form with good effect.  Gait training continued with cane per patient goal/request with activities to promote increased step length with poor right lateral hip stability in stance phase.  Attempted to adjust cane to shorter length to promote more vertical orientation for weight acceptance on device but pt does not agree with this position and sequence. Continued sessions to advance activities  OBJECTIVE IMPAIRMENTS: Abnormal gait, decreased activity tolerance, decreased balance, decreased coordination, decreased knowledge of use of DME, decreased mobility, difficulty walking, decreased strength, impaired tone, and impaired UE functional use.   ACTIVITY LIMITATIONS: carrying, lifting, bending, stairs, transfers, reach over head, and locomotion level  PARTICIPATION LIMITATIONS: meal prep, cleaning, laundry, interpersonal relationship, and community activity  PERSONAL FACTORS: Age, Time since onset of injury/illness/exacerbation, and 3+ comorbidities: CVA, prostate CA, compression fx  are also affecting patient's functional outcome.   REHAB POTENTIAL: Good  CLINICAL DECISION MAKING: Evolving/moderate complexity  EVALUATION COMPLEXITY: Moderate  PLAN:  PT FREQUENCY: 2x/wk x 5 wks  PT DURATION: other: 5 wks--10 sessions  PLANNED INTERVENTIONS: Therapeutic exercises, Therapeutic activity, Neuromuscular re-education, Balance training, Gait training, Patient/Family  education, Self Care, Joint mobilization, Stair training, Vestibular training, Canalith repositioning, Orthotic/Fit training, DME instructions, Aquatic Therapy, Dry Needling, Spinal mobilization, Cryotherapy, Moist heat, and Manual therapy  PLAN FOR NEXT SESSION: I forgot to add pic for LAQ if you would include, corner balance activities, please inform they scheduled too many visits and end of sessions will be on 07/28/22  1:20 PM, 06/25/22 M. Sherlyn Lees, PT, DPT Physical Therapist- [place] Office Number: [phone]   [place] at [facility] [address], [place], [place] [address] Phone # [phone] Fax # [phone]

## 2022-06-26 ENCOUNTER — Ambulatory Visit: Payer: Medicare Other | Admitting: Cardiology

## 2022-06-30 ENCOUNTER — Ambulatory Visit: Payer: Medicare Other

## 2022-06-30 DIAGNOSIS — R262 Difficulty in walking, not elsewhere classified: Secondary | ICD-10-CM

## 2022-06-30 DIAGNOSIS — M6281 Muscle weakness (generalized): Secondary | ICD-10-CM

## 2022-06-30 DIAGNOSIS — R2681 Unsteadiness on feet: Secondary | ICD-10-CM

## 2022-06-30 DIAGNOSIS — R2689 Other abnormalities of gait and mobility: Secondary | ICD-10-CM

## 2022-06-30 NOTE — Therapy (Signed)
OUTPATIENT PHYSICAL THERAPY NEURO TREATMENT   Patient Name: Jose Beck MRN: [id number] DOB:1945-09-25, 77 y.o., male Today's Date: 06/30/2022   PCP: [name], MD REFERRING PROVIDER: [name], MD    END OF SESSION:  PT End of Session - 06/30/22 1405     Visit Number 11    Number of Visits 19    Date for PT Re-Evaluation 07/28/22    Authorization Type [employer] Medicare    Progress Note Due on Visit 55    PT Start Time 1400    PT Stop Time 1445    PT Time Calculation (min) 45 min    Equipment Utilized During Treatment Gait belt    Activity Tolerance Patient tolerated treatment well    Behavior During Therapy WFL for tasks assessed/performed             Past Medical History:  Diagnosis Date   Abdominal aortic aneurysm (AAA) ([place])    Aortic stenosis    a. 11/2015 Echo: EF 55-60%, Gr1 DD, mild AS.   Chronic combined systolic and diastolic CHF (congestive heart failure) ([place])    a. 07/2015: TEE showing EF of 40% b. 11/2015: Echo w/ EF 55-60%, Grade 1 DD, mild AS.   Coronary artery disease    a. patient reports 100% stenosis of LAD and RCA by cath in 1999. b. 11/2015 Cath: LM nl, LAD 100 - fills by L->L collats from D1, LCX 60d, OM1 lalrge/nl, OM2 small/nl, RCA 144m L->R collats.EF 35-45% (55-60% by echo).   Diabetes mellitus without complication ([place]    Type II   GERD (gastroesophageal reflux disease)    Hyperlipemia    Hypertension    Hypertensive heart disease    Stroke ([place]    a. 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   Thrombocytopenia ([place] 11/2015   Past Surgical History:  Procedure Laterality Date   ABDOMINAL AORTIC ENDOVASCULAR STENT GRAFT N/A 11/25/2017   Procedure: ABDOMINAL AORTIC ENDOVASCULAR STENT GRAFT;  Surgeon: BSerafina Mitchell MD;  [facility]: [facility];  Service: Vascular;  Laterality: N/A;   CARDIAC CATHETERIZATION     CARDIAC CATHETERIZATION N/A 11/16/2015   Procedure: Left Heart Cath and Coronary Angiography;   Surgeon: HBelva Crome MD;  [facility];  Service: Cardiovascular;  Laterality: N/A;   MOLE REMOVAL     TONSILLECTOMY     Patient Active Problem List   Diagnosis Date Noted   Acute systolic heart failure (HT[place] 04/27/2022   Atrial fibrillation with tachycardic ventricular rate (HC[place] 04/26/2022   Non-ST elevation (NSTEMI) myocardial infarction (HP[place] 04/26/2022   Demand ischemia 04/26/2022   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   SAH (subarachnoid hemorrhage) (HA[place] 12/19/2021   Cerebrovascular accident (H[place] 01/04/2018   Compression fracture of thoracic vertebra ([place] 01/04/2018   AAA (abdominal aortic aneurysm) ([place] 11/25/2017   History of stroke 01/15/2017   Hyperlipidemia LDL goal <70 04/18/2016   Hypertension    Hypertensive heart disease    Chest pain 11/16/2015   Type 2 diabetes mellitus with circulatory disorder, with long-term current use of insulin (HB[place] 11/16/2015   Essential hypertension 11/16/2015   Thrombocytopenia ([place] 11/16/2015   Leukopenia 11/16/2015   Hyponatremia 11/16/2015   Chronic combined systolic and diastolic CHF (congestive heart failure) (HP[place] 11/16/2015   CAD in native artery    Brainstem infarct, acute (H[place] 09/17/2015   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     Pt accompanied by: significant other "[name]"  PERTINENT HISTORY: past medical history of stroke, AAA, type 2 diabetes, hypertension, CAD who presents to the emergency department with fall.  PAIN:  Are you having pain? No  PRECAUTIONS: Fall  WEIGHT BEARING RESTRICTIONS: No  FALLS: Has patient fallen in last 6 months? Yes. Number of falls 5  LIVING ENVIRONMENT: Lives with: lives with their family and lives with their spouse Lives in: House/apartment, ground floor set-up Stairs: Yes, garage stairs into house, will install rail on left side Has following equipment at home: Gilford Rile - 2 wheeled  PLOF: Independent with household mobility with device, Needs assistance with ADLs, and Needs assistance with homemaking. Mostly able to dress self, needs help with Depends. Some assist with shower transfers (4" lip)  PATIENT GOALS:   OBJECTIVE:   TODAY'S TREATMENT: 06/30/22 Activity Comments  NU-step LE only level 5 x 8 min For coordination and endurance  LAQ 3x10 5#  Standing hamstring curls 3x10 5#  RLE bosu taps 3x10 5# for foot clearance  Pre-gait/gait -limb advancement for RLE 2x10 w/ 5#, 1x10 w/out -visual targets for RLE step length to promote reciprocal gait        TODAY'S TREATMENT: 06/25/22 Activity Comments  LAQ 3x10 5#  Sidestepping x 2 min 5#  Standing hip abd 2x10 5#- added to HEP  Standing hamstring curls 2x10 5#- added to HEP  Minisquats at sink 2x10 Green loop around knees  Seated rows 3x10 25# LUE only  Standing low pull 2x10 20# LUE only  Gait training -pre-gait in parallel bars for limb advancement -gait train with cane and adjustments to promote weight bearing on device and step length w/ CGA x 75 ft -additional trials w/ RW for stride length     TODAY'S TREATMENT: 06/24/22 Activity Comments  5xSTS 13.75 sec, minimal LUE use  TUG test 35.37 sec w/ RW  Stair  ambulation W/ HR on left, ascend w/ step-to and descend retro/sideways w/ HR on Left with CGA  Gait training -w/ RW on level surfaces for stride length -w/ single point cane and CGA-min A -sidestepping x 2 min w/ 4# for SLS -Gait with RW at end of session x 20 ft  PRE 3x10 4# -LAQ  -hamstring curls -hip abd             HOME EXERCISE PROGRAM: Access Code: [email or web address] URL: [email or web address] Date: 06/12/2022 Prepared by: Sherlyn Lees  Exercises - Squat with Chair Touch and Resistance Loop  - 1 x daily - 7 x weekly - 3 sets - 10 reps - Side Stepping with Resistance at Thighs and Counter Support  - 1 x daily - 7 x weekly - 3 sets - 2 min hold - Marching with Resistance  - 1 x daily - 7 x weekly - 3 sets - 10 reps - Seated Long Arc Quad with Ankle Weight  - 1 x daily - 7 x weekly -  3 sets - 10 reps - Standing Hip Abduction with Ankle Weight  - 1 x daily - 7 x weekly - 3 sets - 10 reps - Standing Alternating Knee Flexion with Ankle Weights  - 1 x daily - 7 x weekly - 3 sets - 10 reps ---------------------------------------- From eval: DIAGNOSTIC FINDINGS: CT head and C-spine which were both negative.  COGNITION: Overall cognitive status: Within functional limits for tasks assessed and has some aphasia from hx of CVA   SENSATION: Not tested notes numbness along RUE  COORDINATION: Grossly impaired RUE/RLE Inability to perform rapid alternating movements, heel to shin RLE. Unable to perform finger to nose RUE due to deficits/issues  EDEMA:    MUSCLE TONE: NT   DTRs:  NT  POSTURE: rounded shoulders and forward head  LOWER EXTREMITY ROM:     Active  Right Eval Left Eval  Hip flexion    Hip extension    Hip abduction    Hip adduction    Hip internal rotation    Hip external rotation    Knee flexion    Knee extension    Ankle dorsiflexion 10 15  Ankle plantarflexion    Ankle inversion    Ankle eversion     (Blank rows = not tested)  LOWER  EXTREMITY MMT:    Grossly 4/5 BLE  BED MOBILITY:  NT  TRANSFERS: Assistive device utilized: Environmental consultant - 2 wheeled and None  Sit to stand: Complete Independence and Modified independence Stand to sit: Complete Independence and Modified independence Chair to chair: Complete Independence and Modified independence Floor:  NT  RAMP:  NT  CURB:  Level of Assistance: CGA and Min A Assistive device utilized: Environmental consultant - 2 wheeled Curb Comments:   STAIRS: Level of Assistance: Min A Stair Negotiation Technique: Step to Pattern with Bilateral Rails Number of Stairs: 5  Height of Stairs: 4-6"  Comments: right Trendelenberg very prominent with stairs and profound difficulty achieving foot clearance in descending  GAIT: Gait pattern: step to pattern and shuffling Distance walked:  Assistive device utilized: Environmental consultant - 2 wheeled Level of assistance: SBA Comments:   FUNCTIONAL TESTS:  5 times sit to stand: 16.47 sec Timed up and go (TUG): 57.43 sec 10 meter walk test: NT Berg Balance Scale: NT  PATIENT SURVEYS:  N/A     PATIENT EDUCATION: Education details: assessment findings, rationale for PT intervention Person educated: Patient Education method: Explanation Education comprehension: verbalized understanding    GOALS: Goals reviewed with patient? Yes  SHORT TERM GOALS: Target date: 06/24/2022    Patient will be independent in HEP to improve functional outcomes Baseline: Goal status: MET  2.  Demo reduced risk for falls and improved BLE strength per time 15 sec 5xSTS Baseline: 16 sec; (06/23/22) 13.75 sec minimal LUE use Goal status: MET  3.  Improve independence with stair ambulation at SBA-CGA using HR on left with or without AD to improve safety with home entry/exit Baseline: min A; (06/23/22) SBA-CGA Goal status: MET  4. Patient to be independent in advanced HEP to include strength, balance, and ambulation bouts.  Baseline:  Goal status: INITIAL  LONG TERM  GOALS: Target date: 07/28/2022   Demo reduced risk for falls per TUG test 35 sec w/ RW Baseline: 57 sec; (06/23/22) 35 sec Goal status: REVISED  2.  Demo improved efficiency of gait per speed of 2.5 ft/sec 10 meter walk test Baseline: 0.48 ft/sec Goal status: IN PROGRESS  3.  Improve safety with mobility per supervision  performance with stair ambulation and curb negotiation Baseline: min A w/ RW; (06/23/22) SBA-CGA Goal status: IN PROGRESS  4. Report/demonstrate ability to ambulate household distances w/ cane to meet patient goal  Baseline: unable  Goal status: INITIAL   ASSESSMENT:  CLINICAL IMPRESSION: Demonstrates reduced gait tolerance today w/ poor carryover of step height/length in gait cycle following training/activities. Pt denies any overt symptoms. Unable to walk out of clinic after session needing seated rest period and ride in w/c after 20 ft. Continued sessions to advance HEP development to improve RLE strength/coordination.   OBJECTIVE IMPAIRMENTS: Abnormal gait, decreased activity tolerance, decreased balance, decreased coordination, decreased knowledge of use of DME, decreased mobility, difficulty walking, decreased strength, impaired tone, and impaired UE functional use.   ACTIVITY LIMITATIONS: carrying, lifting, bending, stairs, transfers, reach over head, and locomotion level  PARTICIPATION LIMITATIONS: meal prep, cleaning, laundry, interpersonal relationship, and community activity  PERSONAL FACTORS: Age, Time since onset of injury/illness/exacerbation, and 3+ comorbidities: CVA, prostate CA, compression fx  are also affecting patient's functional outcome.   REHAB POTENTIAL: Good  CLINICAL DECISION MAKING: Evolving/moderate complexity  EVALUATION COMPLEXITY: Moderate  PLAN:  PT FREQUENCY: 2x/wk x 5 wks  PT DURATION: other: 5 wks--10 sessions  PLANNED INTERVENTIONS: Therapeutic exercises, Therapeutic activity, Neuromuscular re-education, Balance training,  Gait training, Patient/Family education, Self Care, Joint mobilization, Stair training, Vestibular training, Canalith repositioning, Orthotic/Fit training, DME instructions, Aquatic Therapy, Dry Needling, Spinal mobilization, Cryotherapy, Moist heat, and Manual therapy  PLAN FOR NEXT SESSION: step height/length (T-band assisted?)   2:06 PM, 06/30/22 M. Sherlyn Lees, PT, DPT Physical Therapist- [place] Office Number: [phone]   [place] at [facility] [address], [place], [place] [address] Phone # [phone] Fax # [phone]

## 2022-07-02 ENCOUNTER — Ambulatory Visit: Payer: Medicare Other

## 2022-07-07 ENCOUNTER — Ambulatory Visit: Payer: Medicare Other

## 2022-07-07 DIAGNOSIS — R262 Difficulty in walking, not elsewhere classified: Secondary | ICD-10-CM | POA: Diagnosis not present

## 2022-07-07 DIAGNOSIS — M6281 Muscle weakness (generalized): Secondary | ICD-10-CM

## 2022-07-07 DIAGNOSIS — I69351 Hemiplegia and hemiparesis following cerebral infarction affecting right dominant side: Secondary | ICD-10-CM

## 2022-07-07 DIAGNOSIS — R2689 Other abnormalities of gait and mobility: Secondary | ICD-10-CM

## 2022-07-07 DIAGNOSIS — R2681 Unsteadiness on feet: Secondary | ICD-10-CM

## 2022-07-07 NOTE — Therapy (Signed)
OUTPATIENT PHYSICAL THERAPY NEURO TREATMENT   Patient Name: Jose Beck MRN: [id number] DOB:03-25-1946, 77 y.o., male Today's Date: 06/30/2022   PCP: [name], MD REFERRING PROVIDER: [name], MD    END OF SESSION:  PT End of Session - 06/30/22 1405     Visit Number 11    Number of Visits 19    Date for PT Re-Evaluation 07/28/22    Authorization Type [employer] Medicare    Progress Note Due on Visit 68    PT Start Time 1400    PT Stop Time 1445    PT Time Calculation (min) 45 min    Equipment Utilized During Treatment Gait belt    Activity Tolerance Patient tolerated treatment well    Behavior During Therapy WFL for tasks assessed/performed             Past Medical History:  Diagnosis Date   Abdominal aortic aneurysm (AAA) ([place])    Aortic stenosis    a. 11/2015 Echo: EF 55-60%, Gr1 DD, mild AS.   Chronic combined systolic and diastolic CHF (congestive heart failure) ([place])    a. 07/2015: TEE showing EF of 40% b. 11/2015: Echo w/ EF 55-60%, Grade 1 DD, mild AS.   Coronary artery disease    a. patient reports 100% stenosis of LAD and RCA by cath in 1999. b. 11/2015 Cath: LM nl, LAD 100 - fills by L->L collats from D1, LCX 60d, OM1 lalrge/nl, OM2 small/nl, RCA 153m L->R collats.EF 35-45% (55-60% by echo).   Diabetes mellitus without complication ([place]    Type II   GERD (gastroesophageal reflux disease)    Hyperlipemia    Hypertension    Hypertensive heart disease    Stroke (HP[place]    a. 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   Thrombocytopenia (HGreentop 11/2015   Past Surgical History:  Procedure Laterality Date   ABDOMINAL AORTIC ENDOVASCULAR STENT GRAFT N/A 11/25/2017   Procedure: ABDOMINAL AORTIC ENDOVASCULAR STENT GRAFT;  Surgeon: BSerafina Mitchell MD;  [facility];  Service: Vascular;  Laterality: N/A;   CARDIAC CATHETERIZATION     CARDIAC CATHETERIZATION N/A 11/16/2015   Procedure: Left Heart Cath and Coronary Angiography;   Surgeon: HBelva Crome MD;  [facility];  Service: Cardiovascular;  Laterality: N/A;   MOLE REMOVAL     TONSILLECTOMY     Patient Active Problem List   Diagnosis Date Noted   Acute systolic heart failure (HR[place] 04/27/2022   Atrial fibrillation with tachycardic ventricular rate (HBensenville 04/26/2022   Non-ST elevation (NSTEMI) myocardial infarction (HJ[place] 04/26/2022   Demand ischemia 04/26/2022   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   SAH (subarachnoid hemorrhage) (HH[place] 12/19/2021   Cerebrovascular accident (H[place] 01/04/2018   Compression fracture of thoracic vertebra (HLomas 01/04/2018   AAA (abdominal aortic aneurysm) (HChinese [place] 11/25/2017   History of stroke 01/15/2017   Hyperlipidemia LDL goal <70 04/18/2016   Hypertension    Hypertensive heart disease    Chest pain 11/16/2015   Type 2 diabetes mellitus with circulatory disorder, with long-term current use of insulin (HNorth [place] 11/16/2015   Essential hypertension 11/16/2015   Thrombocytopenia (HL[place] 11/16/2015   Leukopenia 11/16/2015   Hyponatremia 11/16/2015   Chronic combined systolic and diastolic CHF (congestive heart failure) (HSteina[place] 11/16/2015   CAD in native artery    Brainstem infarct, acute (HAnim[place] 09/17/2015   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 Tested for respiratory illness which was negative. Feeling weak/malaise and had to be lowered to floor on Thursday due to legs giving out    Pt accompanied by: significant other "[name]"  PERTINENT HISTORY: past medical history of stroke, AAA, type 2 diabetes, hypertension, CAD who presents to the emergency department with fall.  PAIN:  Are you having pain? No  PRECAUTIONS: Fall  WEIGHT BEARING RESTRICTIONS: No  FALLS: Has patient fallen in last 6 months? Yes. Number of falls 5  LIVING ENVIRONMENT: Lives with: lives with their family and lives with their spouse Lives in: House/apartment, ground floor set-up Stairs: Yes, garage stairs into house, will install rail on left side Has following equipment at home: Gilford Rile - 2 wheeled  PLOF: Independent with household mobility with device, Needs assistance with ADLs, and Needs assistance with homemaking. Mostly able to dress self, needs help with Depends. Some assist with shower transfers (4" lip)  PATIENT GOALS:   OBJECTIVE:   TODAY'S TREATMENT: 07/07/22 Activity Comments  HEP review -mini squats w/ red loop around knees -sidestepping x 2 min red loop + 3# ankle weights -standing hamstring curls 1x10 3# -LAQ 2x10 3#  Standing on foam -EO/EC 2x30 sec -lateral weight shift and reach                   HOME EXERCISE PROGRAM: Access Code: [email or web address] URL: [email or web address] Date: 07/07/2022 Prepared by: [place] with Counter Support  - 1 x daily - 7 x weekly - 3 sets - 10 reps - Side Stepping with Resistance at Thighs and Counter Support  - 1 x daily - 7 x weekly - 3 sets - 2 min hold - Standing March with Counter Support  - 1 x daily - 7 x weekly - 3 sets - 10 reps - Seated Long Arc Quad with Ankle Weight  - 1 x daily - 7 x weekly - 3 sets - 10 reps - Standing Hip Abduction with Ankle Weight  - 1 x daily - 7 x weekly - 3 sets -  10 reps - Standing Alternating Knee Flexion with Ankle Weights  - 1 x daily - 7 x weekly - 3 sets - 10 reps ---------------------------------------- From eval: DIAGNOSTIC FINDINGS: CT head and C-spine which were both negative.  COGNITION: Overall cognitive status: Within functional limits for tasks assessed and has some aphasia from hx of CVA   SENSATION: Not tested notes numbness along RUE  COORDINATION: Grossly impaired RUE/RLE Inability to perform rapid alternating movements, heel to shin RLE. Unable to perform finger to nose RUE due to deficits/issues  EDEMA:    MUSCLE TONE: NT   DTRs:  NT  POSTURE: rounded shoulders and forward head  LOWER EXTREMITY ROM:     Active  Right Eval Left Eval  Hip flexion    Hip extension    Hip abduction    Hip adduction    Hip internal rotation    Hip  external rotation    Knee flexion    Knee extension    Ankle dorsiflexion 10 15  Ankle plantarflexion    Ankle inversion    Ankle eversion     (Blank rows = not tested)  LOWER EXTREMITY MMT:    Grossly 4/5 BLE  BED MOBILITY:  NT  TRANSFERS: Assistive device utilized: Environmental consultant - 2 wheeled and None  Sit to stand: Complete Independence and Modified independence Stand to sit: Complete Independence and Modified independence Chair to chair: Complete Independence and Modified independence Floor:  NT  RAMP:  NT  CURB:  Level of Assistance: CGA and Min A Assistive device utilized: Environmental consultant - 2 wheeled Curb Comments:   STAIRS: Level of Assistance: Min A Stair Negotiation Technique: Step to Pattern with Bilateral Rails Number of Stairs: 5  Height of Stairs: 4-6"  Comments: right Trendelenberg very prominent with stairs and profound difficulty achieving foot clearance in descending  GAIT: Gait pattern: step to pattern and shuffling Distance walked:  Assistive device utilized: Environmental consultant - 2 wheeled Level of assistance: SBA Comments:   FUNCTIONAL TESTS:  5 times sit to  stand: 16.47 sec Timed up and go (TUG): 57.43 sec 10 meter walk test: NT Berg Balance Scale: NT  PATIENT SURVEYS:  N/A     PATIENT EDUCATION: Education details: assessment findings, rationale for PT intervention Person educated: Patient Education method: Explanation Education comprehension: verbalized understanding    GOALS: Goals reviewed with patient? Yes  SHORT TERM GOALS: Target date: 06/24/2022    Patient will be independent in HEP to improve functional outcomes Baseline: Goal status: MET  2.  Demo reduced risk for falls and improved BLE strength per time 15 sec 5xSTS Baseline: 16 sec; (06/23/22) 13.75 sec minimal LUE use Goal status: MET  3.  Improve independence with stair ambulation at SBA-CGA using HR on left with or without AD to improve safety with home entry/exit Baseline: min A; (06/23/22) SBA-CGA Goal status: MET  4. Patient to be independent in advanced HEP to include strength, balance, and ambulation bouts.  Baseline:  Goal status: INITIAL  LONG TERM GOALS: Target date: 07/28/2022   Demo reduced risk for falls per TUG test 35 sec w/ RW Baseline: 57 sec; (06/23/22) 35 sec Goal status: REVISED  2.  Demo improved efficiency of gait per speed of 2.5 ft/sec 10 meter walk test Baseline: 0.48 ft/sec Goal status: IN PROGRESS  3.  Improve safety with mobility per supervision performance with stair ambulation and curb negotiation Baseline: min A w/ RW; (06/23/22) SBA-CGA Goal status: IN PROGRESS  4. Report/demonstrate ability to ambulate household distances w/ cane to meet patient goal  Baseline: unable  Goal status: INITIAL   ASSESSMENT:  CLINICAL IMPRESSION: Requiring increased cues, coaching, direction for HEP execution today with poor sequencing and carryover. Appears more confused today and requiring frequent redirection. Review of HEP activities and more concise consolidation and pictorial representations found as pt was confused by some of the  pictures on previous examples.    OBJECTIVE IMPAIRMENTS: Abnormal gait, decreased activity tolerance, decreased balance, decreased coordination, decreased knowledge of use of DME, decreased mobility, difficulty walking, decreased strength, impaired tone, and impaired UE functional use.   ACTIVITY LIMITATIONS: carrying, lifting, bending, stairs, transfers, reach over head, and locomotion level  PARTICIPATION LIMITATIONS: meal prep, cleaning, laundry, interpersonal relationship, and community activity  PERSONAL FACTORS: Age, Time since onset of injury/illness/exacerbation, and 3+ comorbidities: CVA, prostate CA, compression fx  are also affecting patient's functional outcome.   REHAB  POTENTIAL: Good  CLINICAL DECISION MAKING: Evolving/moderate complexity  EVALUATION COMPLEXITY: Moderate  PLAN:  PT FREQUENCY: 2x/wk x 5 wks  PT DURATION: other: 5 wks--10 sessions  PLANNED INTERVENTIONS: Therapeutic exercises, Therapeutic activity, Neuromuscular re-education, Balance training, Gait training, Patient/Family education, Self Care, Joint mobilization, Stair training, Vestibular training, Canalith repositioning, Orthotic/Fit training, DME instructions, Aquatic Therapy, Dry Needling, Spinal mobilization, Cryotherapy, Moist heat, and Manual therapy  PLAN FOR NEXT SESSION: step height/length (T-band assisted?)   2:06 PM, 06/30/22 M. Sherlyn Lees, PT, DPT Physical Therapist- [place] Office Number: [phone]   [place] at [facility] [address], [place], [place] [address] Phone # [phone] Fax # [phone]

## 2022-07-09 ENCOUNTER — Ambulatory Visit: Payer: Medicare Other

## 2022-07-09 DIAGNOSIS — R262 Difficulty in walking, not elsewhere classified: Secondary | ICD-10-CM

## 2022-07-09 DIAGNOSIS — M6281 Muscle weakness (generalized): Secondary | ICD-10-CM

## 2022-07-09 DIAGNOSIS — A4151 Sepsis due to Escherichia coli [E. coli]: Secondary | ICD-10-CM | POA: Diagnosis not present

## 2022-07-09 DIAGNOSIS — I69351 Hemiplegia and hemiparesis following cerebral infarction affecting right dominant side: Secondary | ICD-10-CM

## 2022-07-09 DIAGNOSIS — R2689 Other abnormalities of gait and mobility: Secondary | ICD-10-CM

## 2022-07-09 DIAGNOSIS — N390 Urinary tract infection, site not specified: Secondary | ICD-10-CM | POA: Diagnosis not present

## 2022-07-09 DIAGNOSIS — R2681 Unsteadiness on feet: Secondary | ICD-10-CM

## 2022-07-09 NOTE — Therapy (Signed)
OUTPATIENT PHYSICAL THERAPY NEURO TREATMENT   Patient Name: Jose Beck MRN: [id number] DOB:Jan 06, 1946, 77 y.o., male Today's Date: 07/09/2022   PCP: [name], MD REFERRING PROVIDER: [name], MD    END OF SESSION:  PT End of Session - 07/09/22 1318     Visit Number 13    Number of Visits 19    Date for PT Re-Evaluation 07/28/22    Authorization Type [employer]    Progress Note Due on Visit 39    PT Start Time 1317    PT Stop Time 1400    PT Time Calculation (min) 43 min    Equipment Utilized During Treatment Gait belt    Activity Tolerance Patient tolerated treatment well    Behavior During Therapy WFL for tasks assessed/performed             Past Medical History:  Diagnosis Date   Abdominal aortic aneurysm (AAA) ([place])    Aortic stenosis    a. 11/2015 Echo: EF 55-60%, Gr1 DD, mild AS.   Chronic combined systolic and diastolic CHF (congestive heart failure) ([place])    a. 07/2015: TEE showing EF of 40% b. 11/2015: Echo w/ EF 55-60%, Grade 1 DD, mild AS.   Coronary artery disease    a. patient reports 100% stenosis of LAD and RCA by cath in 1999. b. 11/2015 Cath: LM nl, LAD 100 - fills by L->L collats from D1, LCX 60d, OM1 lalrge/nl, OM2 small/nl, RCA 164m L->R collats.EF 35-45% (55-60% by echo).   Diabetes mellitus without complication ([place]    Type II   GERD (gastroesophageal reflux disease)    Hyperlipemia    Hypertension    Hypertensive heart disease    Stroke ([place]    a. 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   Thrombocytopenia ([facility] 11/2015   Past Surgical History:  Procedure Laterality Date   ABDOMINAL AORTIC ENDOVASCULAR STENT GRAFT N/A 11/25/2017   Procedure: ABDOMINAL AORTIC ENDOVASCULAR STENT GRAFT;  Surgeon: BSerafina Mitchell MD;  Location: [facility];  Service: Vascular;  Laterality: N/A;   CARDIAC CATHETERIZATION     CARDIAC CATHETERIZATION N/A 11/16/2015   Procedure: Left Heart Cath and Coronary Angiography;   Surgeon: HBelva Crome MD;  [facility];  Service: Cardiovascular;  Laterality: N/A;   MOLE REMOVAL     TONSILLECTOMY     Patient Active Problem List   Diagnosis Date Noted   Acute systolic heart failure ([facility] 04/27/2022   Atrial fibrillation with tachycardic ventricular rate ([facility] 04/26/2022   Non-ST elevation (NSTEMI) myocardial infarction ([facility] 04/26/2022   Demand ischemia 04/26/2022   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   SAH (subarachnoid hemorrhage) ([facility] 12/19/2021   Cerebrovascular accident ([facility] 01/04/2018   Compression fracture of thoracic vertebra ([facility] 01/04/2018   AAA (abdominal aortic aneurysm) ([facility] 11/25/2017   History of stroke 01/15/2017   Hyperlipidemia LDL goal <70 04/18/2016   Hypertension    Hypertensive heart disease    Chest pain 11/16/2015   Type 2 diabetes mellitus with circulatory disorder, with long-term current use of insulin ([facility] 11/16/2015   Essential hypertension 11/16/2015   Thrombocytopenia ([facility] 11/16/2015   Leukopenia 11/16/2015   Hyponatremia 11/16/2015   Chronic combined systolic and diastolic CHF (congestive heart failure) ([facility] 11/16/2015   CAD in native artery    Brainstem infarct, acute ([facility] 09/17/2015   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 Recent fall on 05/17/22  REFERRING DIAG: I63.9 (ICD-10-CM) - Cerebral infarction, unspecified I69.959 (ICD-10-CM) - Hemiplegia and hemiparesis following unspecified cerebrovascular disease affecting unspecified side R27.0 (ICD-10-CM) - Ataxia, unspecified  THERAPY DIAG:  Muscle weakness (generalized)  Other abnormalities of gait and mobility  Unsteadiness on feet  Difficulty in walking, not elsewhere classified  Hemiplegia and hemiparesis following cerebral  infarction affecting right dominant side (HCC)  Rationale for Evaluation and Treatment: Rehabilitation  SUBJECTIVE:  Nothing new    Pt accompanied by: significant other "[name]"  PERTINENT HISTORY: past medical history of stroke, AAA, type 2 diabetes, hypertension, CAD who presents to the emergency department with fall.  PAIN:  Are you having pain? No  PRECAUTIONS: Fall  WEIGHT BEARING RESTRICTIONS: No  FALLS: Has patient fallen in last 6 months? Yes. Number of falls 5  LIVING ENVIRONMENT: Lives with: lives with their family and lives with their spouse Lives in: House/apartment, ground floor set-up Stairs: Yes, garage stairs into house, will install rail on left side Has following equipment at home: Gilford Rile - 2 wheeled  PLOF: Independent with household mobility with device, Needs assistance with ADLs, and Needs assistance with homemaking. Mostly able to dress self, needs help with Depends. Some assist with shower transfers (4" lip)  PATIENT GOALS:   OBJECTIVE:    TODAY'S TREATMENT: 07/09/22 Activity Comments  Hi knee 1x10 alt   Step advancement over bolster 1x10   Gastroc stretch 1x60 sec   Sidestepping x 2 min // bars  Rest period 1.5 min   Standing on foam -EO 3x30 sec -EC 3x15 sec (mild-mod sway) -EO head turns 3x  Alt stair taps 1x10 10# RLE, 5# LLE 6" step  LAQ 4x10 RLE 10#, visual target for full extension, 30 sec rest between sets for max power  Foot on step 2x30 sec Cues for RLE extension and trunk extension for SLS  Gait training -use of visual cues to promote RLE step length--no success       HOME EXERCISE PROGRAM: Access Code: [email or web address] URL: [email or web address] Date: 07/07/2022 Prepared by: [place] with Counter Support  - 1 x daily - 7 x weekly - 3 sets - 10 reps - Side Stepping with Resistance at Thighs and Counter Support  - 1 x daily - 7 x weekly - 3 sets - 2 min hold - Standing March with Counter  Support  - 1 x daily - 7 x weekly - 3 sets - 10 reps - Seated Long Arc Quad with Ankle Weight  - 1 x daily - 7 x weekly - 3 sets - 10 reps - Standing Hip Abduction with Ankle Weight  - 1 x daily - 7 x weekly - 3 sets - 10 reps - Standing Alternating Knee Flexion with Ankle Weights  - 1 x daily - 7 x weekly - 3 sets - 10 reps ---------------------------------------- From eval: DIAGNOSTIC FINDINGS: CT head and C-spine which were both negative.  COGNITION: Overall cognitive status: Within functional limits for tasks assessed and has some aphasia from hx of CVA   SENSATION: Not tested notes numbness along RUE  COORDINATION: Grossly impaired RUE/RLE Inability to perform rapid alternating movements, heel to shin RLE. Unable to perform finger to nose RUE due to deficits/issues  EDEMA:    MUSCLE TONE: NT   DTRs:  NT  POSTURE: rounded shoulders and forward head  LOWER  EXTREMITY ROM:     Active  Right Eval Left Eval  Hip flexion    Hip extension    Hip abduction    Hip adduction    Hip internal rotation    Hip external rotation    Knee flexion    Knee extension    Ankle dorsiflexion 10 15  Ankle plantarflexion    Ankle inversion    Ankle eversion     (Blank rows = not tested)  LOWER EXTREMITY MMT:    Grossly 4/5 BLE  BED MOBILITY:  NT  TRANSFERS: Assistive device utilized: Environmental consultant - 2 wheeled and None  Sit to stand: Complete Independence and Modified independence Stand to sit: Complete Independence and Modified independence Chair to chair: Complete Independence and Modified independence Floor:  NT  RAMP:  NT  CURB:  Level of Assistance: CGA and Min A Assistive device utilized: Environmental consultant - 2 wheeled Curb Comments:   STAIRS: Level of Assistance: Min A Stair Negotiation Technique: Step to Pattern with Bilateral Rails Number of Stairs: 5  Height of Stairs: 4-6"  Comments: right Trendelenberg very prominent with stairs and profound difficulty achieving foot  clearance in descending  GAIT: Gait pattern: step to pattern and shuffling Distance walked:  Assistive device utilized: Environmental consultant - 2 wheeled Level of assistance: SBA Comments:   FUNCTIONAL TESTS:  5 times sit to stand: 16.47 sec Timed up and go (TUG): 57.43 sec 10 meter walk test: NT Berg Balance Scale: NT  PATIENT SURVEYS:  N/A     PATIENT EDUCATION: Education details: assessment findings, rationale for PT intervention Person educated: Patient Education method: Explanation Education comprehension: verbalized understanding    GOALS: Goals reviewed with patient? Yes  SHORT TERM GOALS: Target date: 06/24/2022    Patient will be independent in HEP to improve functional outcomes Baseline: Goal status: MET  2.  Demo reduced risk for falls and improved BLE strength per time 15 sec 5xSTS Baseline: 16 sec; (06/23/22) 13.75 sec minimal LUE use Goal status: MET  3.  Improve independence with stair ambulation at SBA-CGA using HR on left with or without AD to improve safety with home entry/exit Baseline: min A; (06/23/22) SBA-CGA Goal status: MET  4. Patient to be independent in advanced HEP to include strength, balance, and ambulation bouts.  Baseline:  Goal status: INITIAL  LONG TERM GOALS: Target date: 07/28/2022   Demo reduced risk for falls per TUG test 35 sec w/ RW Baseline: 57 sec; (06/23/22) 35 sec Goal status: REVISED  2.  Demo improved efficiency of gait per speed of 2.5 ft/sec 10 meter walk test Baseline: 0.48 ft/sec Goal status: IN PROGRESS  3.  Improve safety with mobility per supervision performance with stair ambulation and curb negotiation Baseline: min A w/ RW; (06/23/22) SBA-CGA Goal status: IN PROGRESS  4. Report/demonstrate ability to ambulate household distances w/ cane to meet patient goal  Baseline: unable  Goal status: IN PROGRESS   ASSESSMENT:  CLINICAL IMPRESSION: Focus on postural control and balance and maintaining single limb support  to improve RLE advance in swing phase. Minimal carryover to gait cycle unfortunately. Much improved on static standing on compliant surfaces eyes closed with mild-mod sway x 30 sec. Much fatigue by end of session again requiring w/c ride to car   OBJECTIVE IMPAIRMENTS: Abnormal gait, decreased activity tolerance, decreased balance, decreased coordination, decreased knowledge of use of DME, decreased mobility, difficulty walking, decreased strength, impaired tone, and impaired UE functional use.   ACTIVITY LIMITATIONS: carrying, lifting, bending, stairs,  transfers, reach over head, and locomotion level  PARTICIPATION LIMITATIONS: meal prep, cleaning, laundry, interpersonal relationship, and community activity  PERSONAL FACTORS: Age, Time since onset of injury/illness/exacerbation, and 3+ comorbidities: CVA, prostate CA, compression fx  are also affecting patient's functional outcome.   REHAB POTENTIAL: Good  CLINICAL DECISION MAKING: Evolving/moderate complexity  EVALUATION COMPLEXITY: Moderate  PLAN:  PT FREQUENCY: 2x/wk x 5 wks  PT DURATION: other: 5 wks--10 sessions  PLANNED INTERVENTIONS: Therapeutic exercises, Therapeutic activity, Neuromuscular re-education, Balance training, Gait training, Patient/Family education, Self Care, Joint mobilization, Stair training, Vestibular training, Canalith repositioning, Orthotic/Fit training, DME instructions, Aquatic Therapy, Dry Needling, Spinal mobilization, Cryotherapy, Moist heat, and Manual therapy  PLAN FOR NEXT SESSION: step height/length (T-band assisted?)   1:19 PM, 07/09/22 M. Sherlyn Lees, PT, DPT Physical Therapist- [place] Office Number: [phone]   [facility] [address], [place], [place] [address] Phone # [phone] Fax # [phone]

## 2022-07-11 ENCOUNTER — Emergency Department (HOSPITAL_COMMUNITY): Payer: Medicare Other

## 2022-07-11 ENCOUNTER — Inpatient Hospital Stay (HOSPITAL_COMMUNITY)
Admission: EM | Admit: 2022-07-11 | Discharge: 2022-07-15 | DRG: 871 | Disposition: A | Discharge status: Home / Self Care | Payer: Medicare Other | Attending: Internal Medicine | Admitting: Internal Medicine

## 2022-07-11 ENCOUNTER — Other Ambulatory Visit: Payer: Self-pay

## 2022-07-11 ENCOUNTER — Encounter (HOSPITAL_COMMUNITY): Payer: Self-pay

## 2022-07-11 DIAGNOSIS — K766 Portal hypertension: Secondary | ICD-10-CM | POA: Diagnosis present

## 2022-07-11 DIAGNOSIS — I472 Ventricular tachycardia, unspecified: Secondary | ICD-10-CM | POA: Diagnosis not present

## 2022-07-11 DIAGNOSIS — I5042 Chronic combined systolic (congestive) and diastolic (congestive) heart failure: Secondary | ICD-10-CM | POA: Diagnosis present

## 2022-07-11 DIAGNOSIS — Z8679 Personal history of other diseases of the circulatory system: Secondary | ICD-10-CM

## 2022-07-11 DIAGNOSIS — E876 Hypokalemia: Secondary | ICD-10-CM | POA: Diagnosis not present

## 2022-07-11 DIAGNOSIS — N320 Bladder-neck obstruction: Secondary | ICD-10-CM | POA: Diagnosis present

## 2022-07-11 DIAGNOSIS — K7460 Unspecified cirrhosis of liver: Secondary | ICD-10-CM | POA: Diagnosis present

## 2022-07-11 DIAGNOSIS — A4151 Sepsis due to Escherichia coli [E. coli]: Principal | ICD-10-CM | POA: Diagnosis present

## 2022-07-11 DIAGNOSIS — I700 Atherosclerosis of aorta: Secondary | ICD-10-CM | POA: Diagnosis present

## 2022-07-11 DIAGNOSIS — N390 Urinary tract infection, site not specified: Secondary | ICD-10-CM

## 2022-07-11 DIAGNOSIS — Z1152 Encounter for screening for COVID-19: Secondary | ICD-10-CM

## 2022-07-11 DIAGNOSIS — R54 Age-related physical debility: Secondary | ICD-10-CM | POA: Diagnosis present

## 2022-07-11 DIAGNOSIS — N136 Pyonephrosis: Secondary | ICD-10-CM | POA: Diagnosis present

## 2022-07-11 DIAGNOSIS — Z7901 Long term (current) use of anticoagulants: Secondary | ICD-10-CM

## 2022-07-11 DIAGNOSIS — M25512 Pain in left shoulder: Secondary | ICD-10-CM | POA: Diagnosis present

## 2022-07-11 DIAGNOSIS — M24412 Recurrent dislocation, left shoulder: Secondary | ICD-10-CM | POA: Diagnosis present

## 2022-07-11 DIAGNOSIS — I350 Nonrheumatic aortic (valve) stenosis: Secondary | ICD-10-CM | POA: Diagnosis present

## 2022-07-11 DIAGNOSIS — Z8249 Family history of ischemic heart disease and other diseases of the circulatory system: Secondary | ICD-10-CM

## 2022-07-11 DIAGNOSIS — R599 Enlarged lymph nodes, unspecified: Secondary | ICD-10-CM | POA: Diagnosis present

## 2022-07-11 DIAGNOSIS — D649 Anemia, unspecified: Secondary | ICD-10-CM | POA: Diagnosis present

## 2022-07-11 DIAGNOSIS — I69320 Aphasia following cerebral infarction: Secondary | ICD-10-CM

## 2022-07-11 DIAGNOSIS — Z79899 Other long term (current) drug therapy: Secondary | ICD-10-CM

## 2022-07-11 DIAGNOSIS — Z794 Long term (current) use of insulin: Secondary | ICD-10-CM

## 2022-07-11 DIAGNOSIS — E119 Type 2 diabetes mellitus without complications: Secondary | ICD-10-CM | POA: Diagnosis present

## 2022-07-11 DIAGNOSIS — N179 Acute kidney failure, unspecified: Secondary | ICD-10-CM | POA: Diagnosis present

## 2022-07-11 DIAGNOSIS — I219 Acute myocardial infarction, unspecified: Secondary | ICD-10-CM | POA: Diagnosis present

## 2022-07-11 DIAGNOSIS — K219 Gastro-esophageal reflux disease without esophagitis: Secondary | ICD-10-CM | POA: Diagnosis present

## 2022-07-11 DIAGNOSIS — I4729 Other ventricular tachycardia: Secondary | ICD-10-CM

## 2022-07-11 DIAGNOSIS — I25110 Atherosclerotic heart disease of native coronary artery with unstable angina pectoris: Secondary | ICD-10-CM | POA: Diagnosis present

## 2022-07-11 DIAGNOSIS — R051 Acute cough: Secondary | ICD-10-CM | POA: Diagnosis present

## 2022-07-11 DIAGNOSIS — I7143 Infrarenal abdominal aortic aneurysm, without rupture: Secondary | ICD-10-CM | POA: Diagnosis present

## 2022-07-11 DIAGNOSIS — R338 Other retention of urine: Secondary | ICD-10-CM | POA: Insufficient documentation

## 2022-07-11 DIAGNOSIS — R6510 Systemic inflammatory response syndrome (SIRS) of non-infectious origin without acute organ dysfunction: Secondary | ICD-10-CM

## 2022-07-11 DIAGNOSIS — Z91011 Allergy to milk products: Secondary | ICD-10-CM

## 2022-07-11 DIAGNOSIS — Z888 Allergy status to other drugs, medicaments and biological substances status: Secondary | ICD-10-CM

## 2022-07-11 DIAGNOSIS — I480 Paroxysmal atrial fibrillation: Secondary | ICD-10-CM | POA: Diagnosis present

## 2022-07-11 DIAGNOSIS — E785 Hyperlipidemia, unspecified: Secondary | ICD-10-CM | POA: Diagnosis present

## 2022-07-11 DIAGNOSIS — Z95828 Presence of other vascular implants and grafts: Secondary | ICD-10-CM

## 2022-07-11 DIAGNOSIS — I110 Hypertensive heart disease with heart failure: Secondary | ICD-10-CM | POA: Diagnosis present

## 2022-07-11 DIAGNOSIS — I21A1 Myocardial infarction type 2: Secondary | ICD-10-CM | POA: Diagnosis present

## 2022-07-11 DIAGNOSIS — D696 Thrombocytopenia, unspecified: Secondary | ICD-10-CM | POA: Diagnosis present

## 2022-07-11 DIAGNOSIS — A419 Sepsis, unspecified organism: Secondary | ICD-10-CM | POA: Diagnosis present

## 2022-07-11 DIAGNOSIS — I255 Ischemic cardiomyopathy: Secondary | ICD-10-CM | POA: Diagnosis present

## 2022-07-11 DIAGNOSIS — C61 Malignant neoplasm of prostate: Secondary | ICD-10-CM | POA: Diagnosis present

## 2022-07-11 DIAGNOSIS — G8929 Other chronic pain: Secondary | ICD-10-CM | POA: Diagnosis present

## 2022-07-11 DIAGNOSIS — R7989 Other specified abnormal findings of blood chemistry: Secondary | ICD-10-CM

## 2022-07-11 DIAGNOSIS — I2582 Chronic total occlusion of coronary artery: Secondary | ICD-10-CM | POA: Diagnosis present

## 2022-07-11 LAB — RESP PANEL BY RT-PCR (RSV, FLU A&B, COVID)  RVPGX2
Influenza A by PCR: NEGATIVE
Influenza B by PCR: NEGATIVE
Resp Syncytial Virus by PCR: NEGATIVE
SARS Coronavirus 2 by RT PCR: NEGATIVE

## 2022-07-11 LAB — COMPREHENSIVE METABOLIC PANEL
ALT: 28 U/L (ref 0–44)
AST: 23 U/L (ref 15–41)
Albumin: 3.1 g/dL — ABNORMAL LOW (ref 3.5–5.0)
Alkaline Phosphatase: 61 U/L (ref 38–126)
Anion gap: 16 — ABNORMAL HIGH (ref 5–15)
BUN: 38 mg/dL — ABNORMAL HIGH (ref 8–23)
CO2: 22 mmol/L (ref 22–32)
Calcium: 8.9 mg/dL (ref 8.9–10.3)
Chloride: 103 mmol/L (ref 98–111)
Creatinine, Ser: 1.8 mg/dL — ABNORMAL HIGH (ref 0.61–1.24)
GFR, Estimated: 39 mL/min — ABNORMAL LOW (ref >60)
Glucose, Bld: 173 mg/dL — ABNORMAL HIGH (ref 70–99)
Potassium: 3.7 mmol/L (ref 3.5–5.1)
Sodium: 141 mmol/L (ref 135–145)
Total Bilirubin: 0.8 mg/dL (ref 0.3–1.2)
Total Protein: 7.1 g/dL (ref 6.5–8.1)

## 2022-07-11 LAB — CBC
HCT: 31.5 % — ABNORMAL LOW (ref 39.0–52.0)
Hemoglobin: 10.5 g/dL — ABNORMAL LOW (ref 13.0–17.0)
MCH: 32.4 pg (ref 26.0–34.0)
MCHC: 33.3 g/dL (ref 30.0–36.0)
MCV: 97.2 fL (ref 80.0–100.0)
Platelets: 120 10*3/uL — ABNORMAL LOW (ref 150–400)
RBC: 3.24 MIL/uL — ABNORMAL LOW (ref 4.22–5.81)
RDW: 13.9 % (ref 11.5–15.5)
WBC: 11.1 10*3/uL — ABNORMAL HIGH (ref 4.0–10.5)
nRBC: 0 % (ref 0.0–0.2)

## 2022-07-11 LAB — URINALYSIS, ROUTINE W REFLEX MICROSCOPIC
Bilirubin Urine: NEGATIVE
Glucose, UA: NEGATIVE mg/dL
Ketones, ur: NEGATIVE mg/dL
Nitrite: POSITIVE — AB
Protein, ur: NEGATIVE mg/dL
Specific Gravity, Urine: 1.011 (ref 1.005–1.030)
WBC, UA: >50 WBC/hpf (ref 0–5)
pH: 5 (ref 5.0–8.0)

## 2022-07-11 LAB — LIPASE, BLOOD: Lipase: 43 U/L (ref 11–51)

## 2022-07-11 LAB — TROPONIN I (HIGH SENSITIVITY): Troponin I (High Sensitivity): 5092 ng/L (ref <18)

## 2022-07-11 LAB — LACTIC ACID, PLASMA: Lactic Acid, Venous: 1.8 mmol/L (ref 0.5–1.9)

## 2022-07-11 MED ORDER — SODIUM CHLORIDE 0.9 % IV SOLN
2.0000 g | Freq: Once | INTRAVENOUS | Status: AC
  Administered 2022-07-11: 2 g via INTRAVENOUS
  Filled 2022-07-11: qty 20

## 2022-07-11 MED ORDER — ACETAMINOPHEN 325 MG PO TABS
650.0000 mg | ORAL_TABLET | Freq: Once | ORAL | Status: AC
  Administered 2022-07-11: 650 mg via ORAL
  Filled 2022-07-11: qty 2

## 2022-07-11 MED ORDER — LACTATED RINGERS IV BOLUS
500.0000 mL | Freq: Once | INTRAVENOUS | Status: AC
  Administered 2022-07-11: 500 mL via INTRAVENOUS

## 2022-07-11 NOTE — ED Provider Notes (Signed)
[facility] Provider Note   CSN: [id number] Arrival date & time: 07/11/22  1929     History {Add pertinent medical, surgical, social history, OB history to HPI:1} Chief Complaint  Patient presents with   Chills   Emesis   Jose Beck is a 77 y.o. male.  He is brought in by EMS from home.  Wife states he was sick starting last week with cough runny nose lethargy.  Saw his PCP and was told to try some Robitussin and tested negative for COVID and flu.  Cough has been increasingly productive and tonight had some shaking chills.  Has been more tired than normal.  Wife states he has had a couple episodes of vomiting.  No diarrhea.  Has been eating okay.  Denies abdominal pain although she feels his abdomen is distended. No chest pain.   The history is provided by the patient and the spouse.  Cough Cough characteristics:  Productive Sputum characteristics:  [place] Severity:  Moderate Onset quality:  Gradual Duration:  1 week Timing:  Intermittent Progression:  Worsening Chronicity:  New Smoker: no   Relieved by:  None tried Worsened by:  Nothing Ineffective treatments:  None tried Associated symptoms: rhinorrhea   Associated symptoms: no chest pain and no fever        Home Medications Prior to Admission medications   Medication Sig Start Date End Date Taking? Authorizing Provider  acetaminophen (TYLENOL) 650 MG CR tablet Take 650 mg by mouth in the morning, at noon, and at bedtime.    [provider]  apixaban (ELIQUIS) 5 MG TABS tablet TAKE 1 TABLET BY MOUTH TWICE A DAY 06/10/22   [place], Peter M, MD  atorvastatin (LIPITOR) 80 MG tablet Take 1 tablet (80 mg total) by mouth daily at 6 PM. 04/25/16   [place], Peter M, MD  calcium-vitamin D 250-100 MG-UNIT tablet Take 1 tablet by mouth daily. Takes in morning '600mg'$     [provider]  insulin detemir (LEVEMIR) 100 UNIT/ML injection Inject 10 Units into the skin  daily.    [provider]  metoprolol succinate (TOPROL-XL) 25 MG 24 hr tablet Take 1 tablet (25 mg total) by mouth daily. PLEASE KEEP SCHEDULED APPOINTMENT WITH CARDIOLOGIST 06/20/22   [place], Peter M, MD  nitroGLYCERIN (NITROSTAT) 0.4 MG SL tablet Place 0.4 mg under the tongue every 5 (five) minutes as needed for chest pain.    [provider]  tacrolimus (PROTOPIC) 0.1 % ointment Apply 1 application topically daily as needed (facial scaling).    [provider]      Allergies    Cheese and Pravastatin    Review of Systems   Review of Systems  Constitutional:  Negative for fever.  HENT:  Positive for rhinorrhea.   Respiratory:  Positive for cough.   Cardiovascular:  Negative for chest pain.  Gastrointestinal:  Positive for abdominal distention and vomiting.  Genitourinary:  Negative for dysuria.    Physical Exam Updated Vital Signs BP (!) 149/83 (BP Location: Right Arm)   Pulse (!) 107   Temp 99.8 F (37.7 C) (Oral)   Resp (!) 24   SpO2 97%  Physical Exam Vitals and nursing note reviewed.  Constitutional:      General: He is not in acute distress.    Appearance: Normal appearance. He is well-developed.  HENT:     Head: Normocephalic and atraumatic.  Eyes:     Conjunctiva/sclera: Conjunctivae normal.  Cardiovascular:  Rate and Rhythm: Regular rhythm. Tachycardia present.     Heart sounds: Murmur heard.  Pulmonary:     Effort: Pulmonary effort is normal. No respiratory distress.     Breath sounds: Normal breath sounds.  Abdominal:     General: There is distension.     Palpations: Abdomen is soft.     Tenderness: There is no abdominal tenderness. There is no guarding or rebound.  Musculoskeletal:        General: No tenderness.     Cervical back: Neck supple.     Right lower leg: Edema present.     Left lower leg: Edema present.  Skin:    General: Skin is warm and dry.     Capillary Refill: Capillary refill takes less than 2 seconds.   Neurological:     General: No focal deficit present.     Mental Status: He is alert. He is disoriented.     ED Results / Procedures / Treatments   Labs (all labs ordered are listed, but only abnormal results are displayed) Labs Reviewed  RESP PANEL BY RT-PCR (RSV, FLU A&B, COVID)  [id number]  LIPASE, BLOOD  COMPREHENSIVE METABOLIC PANEL  CBC  URINALYSIS, ROUTINE W REFLEX MICROSCOPIC    EKG None  Radiology No results found.  Procedures Procedures  {Document cardiac monitor, telemetry assessment procedure when appropriate:1}  Medications Ordered in ED Medications - No data to display  ED Course/ Medical Decision Making/ A&P Clinical Course as of 07/11/22 2332  Fri Jul 11, 2022  2035 Patient's chest x-ray did not show any gross infiltrates.  Awaiting radiology reading. [MB]  2244 I updated the patient and his wife regarding his testing so far.  Does have a mild AKI signs of urinary tract infection elevated white count.  Lactate is normal.  COVID and flu negative.  Have ordered him IV antibiotics and put him in for a noncontrast CT of his chest and abdomen.  He will need admission for further workup.  He is agreeable to admission [MB]    Clinical Course User Index [MB] Hayden Rasmussen, MD   {   Click here for ABCD2, HEART and other calculatorsREFRESH Note before signing :1}                          Medical Decision Making Amount and/or Complexity of Data Reviewed Labs: ordered. Radiology: ordered.  Risk OTC drugs.   This patient complains of ***; this involves an extensive number of treatment Options and is a complaint that carries with it a high risk of complications and morbidity. The differential includes ***  I ordered, reviewed and interpreted labs, which included *** I ordered medication *** and reviewed PMP when indicated. I ordered imaging studies which included *** and I independently    visualized and interpreted imaging which showed *** Additional  history obtained from *** Previous records obtained and reviewed *** I consulted *** and discussed lab and imaging findings and discussed disposition.  Cardiac monitoring reviewed, *** Social determinants considered, *** Critical Interventions: ***  After the interventions stated above, I reevaluated the patient and found *** Admission and further testing considered, ***   {Document critical care time when appropriate:1} {Document review of labs and clinical decision tools ie heart score, Chads2Vasc2 etc:1}  {Document your independent review of radiology images, and any outside records:1} {Document your discussion with family members, caretakers, and with consultants:1} {Document social determinants of health affecting pt's care:1} {Document your  decision making why or why not admission, treatments were needed:1} Final Clinical Impression(s) / ED Diagnoses Final diagnoses:  None    Rx / DC Orders ED Discharge Orders     None

## 2022-07-11 NOTE — ED Triage Notes (Addendum)
Patient BIB [facility] from home. Complaint of chills with shaking, was seen by PCP 1 week ago.  EMS report patient vomited 1 time with them; wife reports patient has vomited couple times over past week.

## 2022-07-12 ENCOUNTER — Inpatient Hospital Stay (HOSPITAL_COMMUNITY): Payer: Medicare Other

## 2022-07-12 ENCOUNTER — Encounter: Payer: Self-pay | Admitting: Physical Therapy

## 2022-07-12 DIAGNOSIS — I2510 Atherosclerotic heart disease of native coronary artery without angina pectoris: Secondary | ICD-10-CM | POA: Diagnosis not present

## 2022-07-12 DIAGNOSIS — I255 Ischemic cardiomyopathy: Secondary | ICD-10-CM

## 2022-07-12 DIAGNOSIS — I219 Acute myocardial infarction, unspecified: Secondary | ICD-10-CM | POA: Diagnosis not present

## 2022-07-12 DIAGNOSIS — I2489 Other forms of acute ischemic heart disease: Secondary | ICD-10-CM

## 2022-07-12 DIAGNOSIS — K7460 Unspecified cirrhosis of liver: Secondary | ICD-10-CM | POA: Diagnosis present

## 2022-07-12 DIAGNOSIS — A419 Sepsis, unspecified organism: Secondary | ICD-10-CM | POA: Diagnosis present

## 2022-07-12 DIAGNOSIS — N390 Urinary tract infection, site not specified: Secondary | ICD-10-CM | POA: Diagnosis present

## 2022-07-12 DIAGNOSIS — I4729 Other ventricular tachycardia: Secondary | ICD-10-CM | POA: Diagnosis not present

## 2022-07-12 DIAGNOSIS — E119 Type 2 diabetes mellitus without complications: Secondary | ICD-10-CM | POA: Diagnosis present

## 2022-07-12 DIAGNOSIS — M25512 Pain in left shoulder: Secondary | ICD-10-CM | POA: Diagnosis present

## 2022-07-12 DIAGNOSIS — I5042 Chronic combined systolic (congestive) and diastolic (congestive) heart failure: Secondary | ICD-10-CM | POA: Diagnosis present

## 2022-07-12 DIAGNOSIS — I472 Ventricular tachycardia, unspecified: Secondary | ICD-10-CM | POA: Diagnosis not present

## 2022-07-12 DIAGNOSIS — C61 Malignant neoplasm of prostate: Secondary | ICD-10-CM | POA: Diagnosis present

## 2022-07-12 DIAGNOSIS — E876 Hypokalemia: Secondary | ICD-10-CM | POA: Diagnosis not present

## 2022-07-12 DIAGNOSIS — N179 Acute kidney failure, unspecified: Secondary | ICD-10-CM | POA: Diagnosis present

## 2022-07-12 DIAGNOSIS — I21A1 Myocardial infarction type 2: Secondary | ICD-10-CM | POA: Diagnosis present

## 2022-07-12 DIAGNOSIS — D696 Thrombocytopenia, unspecified: Secondary | ICD-10-CM | POA: Diagnosis present

## 2022-07-12 DIAGNOSIS — R338 Other retention of urine: Secondary | ICD-10-CM | POA: Diagnosis not present

## 2022-07-12 DIAGNOSIS — Z1152 Encounter for screening for COVID-19: Secondary | ICD-10-CM | POA: Diagnosis not present

## 2022-07-12 DIAGNOSIS — N136 Pyonephrosis: Secondary | ICD-10-CM | POA: Diagnosis present

## 2022-07-12 DIAGNOSIS — A4151 Sepsis due to Escherichia coli [E. coli]: Secondary | ICD-10-CM | POA: Diagnosis present

## 2022-07-12 DIAGNOSIS — I69320 Aphasia following cerebral infarction: Secondary | ICD-10-CM | POA: Diagnosis not present

## 2022-07-12 DIAGNOSIS — I25110 Atherosclerotic heart disease of native coronary artery with unstable angina pectoris: Secondary | ICD-10-CM | POA: Diagnosis present

## 2022-07-12 DIAGNOSIS — I214 Non-ST elevation (NSTEMI) myocardial infarction: Secondary | ICD-10-CM

## 2022-07-12 DIAGNOSIS — I110 Hypertensive heart disease with heart failure: Secondary | ICD-10-CM | POA: Diagnosis present

## 2022-07-12 DIAGNOSIS — I480 Paroxysmal atrial fibrillation: Secondary | ICD-10-CM | POA: Diagnosis present

## 2022-07-12 DIAGNOSIS — Z794 Long term (current) use of insulin: Secondary | ICD-10-CM | POA: Diagnosis not present

## 2022-07-12 DIAGNOSIS — E785 Hyperlipidemia, unspecified: Secondary | ICD-10-CM | POA: Diagnosis present

## 2022-07-12 DIAGNOSIS — K766 Portal hypertension: Secondary | ICD-10-CM | POA: Diagnosis present

## 2022-07-12 DIAGNOSIS — D649 Anemia, unspecified: Secondary | ICD-10-CM | POA: Diagnosis present

## 2022-07-12 DIAGNOSIS — I700 Atherosclerosis of aorta: Secondary | ICD-10-CM | POA: Diagnosis present

## 2022-07-12 DIAGNOSIS — I350 Nonrheumatic aortic (valve) stenosis: Secondary | ICD-10-CM | POA: Diagnosis present

## 2022-07-12 LAB — TROPONIN I (HIGH SENSITIVITY)
Troponin I (High Sensitivity): 16169 ng/L (ref <18)
Troponin I (High Sensitivity): 14490 ng/L (ref <18)
Troponin I (High Sensitivity): 12829 ng/L (ref <18)
Troponin I (High Sensitivity): 13452 ng/L (ref <18)

## 2022-07-12 LAB — BLOOD CULTURE ID PANEL (REFLEXED) - BCID2

## 2022-07-12 LAB — ECHOCARDIOGRAM COMPLETE
AR max vel: 0.83 cm2
AV Area VTI: 0.87 cm2
AV Area mean vel: 0.79 cm2
AV Mean grad: 25 mmHg
AV Peak grad: 42 mmHg
Ao pk vel: 3.24 m/s
Area-P 1/2: 5.34 cm2
Calc EF: 35.9 %
Height: 68 in
S' Lateral: 4 cm
Single Plane A2C EF: 36.6 %
Single Plane A4C EF: 30.3 %
Weight: 3266.34 oz

## 2022-07-12 LAB — GLUCOSE, CAPILLARY
Glucose-Capillary: 142 mg/dL — ABNORMAL HIGH (ref 70–99)
Glucose-Capillary: 154 mg/dL — ABNORMAL HIGH (ref 70–99)
Glucose-Capillary: 140 mg/dL — ABNORMAL HIGH (ref 70–99)
Glucose-Capillary: 169 mg/dL — ABNORMAL HIGH (ref 70–99)
Glucose-Capillary: 172 mg/dL — ABNORMAL HIGH (ref 70–99)

## 2022-07-12 LAB — CBC
HCT: 28.9 % — ABNORMAL LOW (ref 39.0–52.0)
Hemoglobin: 9.7 g/dL — ABNORMAL LOW (ref 13.0–17.0)
MCH: 31.9 pg (ref 26.0–34.0)
MCHC: 33.6 g/dL (ref 30.0–36.0)
MCV: 95.1 fL (ref 80.0–100.0)
Platelets: 106 10*3/uL — ABNORMAL LOW (ref 150–400)
RBC: 3.04 MIL/uL — ABNORMAL LOW (ref 4.22–5.81)
RDW: 14.2 % (ref 11.5–15.5)
WBC: 10.1 10*3/uL (ref 4.0–10.5)
nRBC: 0 % (ref 0.0–0.2)

## 2022-07-12 LAB — COMPREHENSIVE METABOLIC PANEL
ALT: 29 U/L (ref 0–44)
AST: 73 U/L — ABNORMAL HIGH (ref 15–41)
Albumin: 2.9 g/dL — ABNORMAL LOW (ref 3.5–5.0)
Alkaline Phosphatase: 55 U/L (ref 38–126)
Anion gap: 13 (ref 5–15)
BUN: 37 mg/dL — ABNORMAL HIGH (ref 8–23)
CO2: 23 mmol/L (ref 22–32)
Calcium: 8.7 mg/dL — ABNORMAL LOW (ref 8.9–10.3)
Chloride: 103 mmol/L (ref 98–111)
Creatinine, Ser: 1.77 mg/dL — ABNORMAL HIGH (ref 0.61–1.24)
GFR, Estimated: 39 mL/min — ABNORMAL LOW (ref >60)
Glucose, Bld: 165 mg/dL — ABNORMAL HIGH (ref 70–99)
Potassium: 3.8 mmol/L (ref 3.5–5.1)
Sodium: 139 mmol/L (ref 135–145)
Total Bilirubin: 0.6 mg/dL (ref 0.3–1.2)
Total Protein: 7.1 g/dL (ref 6.5–8.1)

## 2022-07-12 LAB — CBC WITH DIFFERENTIAL/PLATELET
Abs Immature Granulocytes: 0.07 10*3/uL (ref 0.00–0.07)
Basophils Absolute: 0 10*3/uL (ref 0.0–0.1)
Basophils Relative: 0 %
Eosinophils Absolute: 0 10*3/uL (ref 0.0–0.5)
Eosinophils Relative: 0 %
HCT: 30.2 % — ABNORMAL LOW (ref 39.0–52.0)
Hemoglobin: 9.9 g/dL — ABNORMAL LOW (ref 13.0–17.0)
Immature Granulocytes: 1 %
Lymphocytes Relative: 3 %
Lymphs Abs: 0.3 10*3/uL — ABNORMAL LOW (ref 0.7–4.0)
MCH: 32.1 pg (ref 26.0–34.0)
MCHC: 32.8 g/dL (ref 30.0–36.0)
MCV: 98.1 fL (ref 80.0–100.0)
Monocytes Absolute: 0.5 10*3/uL (ref 0.1–1.0)
Monocytes Relative: 5 %
Neutro Abs: 9.2 10*3/uL — ABNORMAL HIGH (ref 1.7–7.7)
Neutrophils Relative %: 91 %
Platelets: 109 10*3/uL — ABNORMAL LOW (ref 150–400)
RBC: 3.08 MIL/uL — ABNORMAL LOW (ref 4.22–5.81)
RDW: 14.2 % (ref 11.5–15.5)
WBC: 10.1 10*3/uL (ref 4.0–10.5)
nRBC: 0 % (ref 0.0–0.2)

## 2022-07-12 LAB — TSH: TSH: 0.673 u[IU]/mL (ref 0.350–4.500)

## 2022-07-12 LAB — MAGNESIUM: Magnesium: 1.6 mg/dL — ABNORMAL LOW (ref 1.7–2.4)

## 2022-07-12 LAB — BRAIN NATRIURETIC PEPTIDE: B Natriuretic Peptide: 607.7 pg/mL — ABNORMAL HIGH (ref 0.0–100.0)

## 2022-07-12 LAB — HEPARIN LEVEL (UNFRACTIONATED): Heparin Unfractionated: >1.1 IU/mL — ABNORMAL HIGH (ref 0.30–0.70)

## 2022-07-12 LAB — MRSA NEXT GEN BY PCR, NASAL: MRSA by PCR Next Gen: NOT DETECTED

## 2022-07-12 LAB — APTT
aPTT: 40 seconds — ABNORMAL HIGH (ref 24–36)
aPTT: 113 seconds — ABNORMAL HIGH (ref 24–36)

## 2022-07-12 MED ORDER — METOPROLOL SUCCINATE ER 25 MG PO TB24
25.0000 mg | ORAL_TABLET | Freq: Every day | ORAL | Status: DC
  Administered 2022-07-12 – 2022-07-15 (×4): 25 mg via ORAL
  Filled 2022-07-12 (×4): qty 1

## 2022-07-12 MED ORDER — ONDANSETRON HCL 4 MG PO TABS: 4.0000 mg | ORAL_TABLET | Freq: Four times a day (QID) | ORAL | Status: DC | PRN

## 2022-07-12 MED ORDER — ACETAMINOPHEN 325 MG PO TABS
650.0000 mg | ORAL_TABLET | Freq: Four times a day (QID) | ORAL | Status: DC | PRN
  Filled 2022-07-12: qty 2

## 2022-07-12 MED ORDER — TAMSULOSIN HCL 0.4 MG PO CAPS
0.4000 mg | ORAL_CAPSULE | Freq: Every day | ORAL | Status: DC
  Administered 2022-07-12 – 2022-07-15 (×4): 0.4 mg via ORAL
  Filled 2022-07-12 (×4): qty 1

## 2022-07-12 MED ORDER — ALBUTEROL SULFATE (2.5 MG/3ML) 0.083% IN NEBU: 2.5000 mg | INHALATION_SOLUTION | RESPIRATORY_TRACT | Status: DC | PRN

## 2022-07-12 MED ORDER — MAGNESIUM SULFATE 2 GM/50ML IV SOLN
2.0000 g | Freq: Once | INTRAVENOUS | Status: AC
  Administered 2022-07-12: 2 g via INTRAVENOUS
  Filled 2022-07-12: qty 50

## 2022-07-12 MED ORDER — ATORVASTATIN CALCIUM 80 MG PO TABS
80.0000 mg | ORAL_TABLET | Freq: Every day | ORAL | Status: DC
  Administered 2022-07-12 – 2022-07-14 (×3): 80 mg via ORAL
  Filled 2022-07-12 (×3): qty 1

## 2022-07-12 MED ORDER — INSULIN ASPART 100 UNIT/ML IJ SOLN
0.0000 [IU] | INTRAMUSCULAR | Status: DC
  Administered 2022-07-12 – 2022-07-15 (×7): 1 [IU] via SUBCUTANEOUS

## 2022-07-12 MED ORDER — PERFLUTREN LIPID MICROSPHERE
1.0000 mL | INTRAVENOUS | Status: AC | PRN
  Administered 2022-07-12: 3 mL via INTRAVENOUS

## 2022-07-12 MED ORDER — CHLORHEXIDINE GLUCONATE CLOTH 2 % EX PADS
6.0000 | MEDICATED_PAD | Freq: Every day | CUTANEOUS | Status: DC
  Administered 2022-07-12 – 2022-07-15 (×4): 6 via TOPICAL

## 2022-07-12 MED ORDER — SODIUM CHLORIDE 0.9 % IV SOLN
2.0000 g | INTRAVENOUS | Status: DC
  Administered 2022-07-12 – 2022-07-14 (×3): 2 g via INTRAVENOUS
  Filled 2022-07-12 (×3): qty 20

## 2022-07-12 MED ORDER — ONDANSETRON HCL 4 MG/2ML IJ SOLN
4.0000 mg | Freq: Four times a day (QID) | INTRAMUSCULAR | Status: DC | PRN
  Administered 2022-07-12: 4 mg via INTRAVENOUS
  Filled 2022-07-12: qty 2

## 2022-07-12 MED ORDER — ACETAMINOPHEN 650 MG RE SUPP: 650.0000 mg | Freq: Four times a day (QID) | RECTAL | Status: DC | PRN

## 2022-07-12 MED ORDER — HEPARIN (PORCINE) 25000 UT/250ML-% IV SOLN
1300.0000 [IU]/h | INTRAVENOUS | Status: DC
  Administered 2022-07-12: 1350 [IU]/h via INTRAVENOUS
  Administered 2022-07-12: 1200 [IU]/h via INTRAVENOUS
  Filled 2022-07-12 (×2): qty 250

## 2022-07-12 MED ORDER — APIXABAN 5 MG PO TABS: 5.0000 mg | ORAL_TABLET | Freq: Two times a day (BID) | ORAL | Status: DC

## 2022-07-12 MED ORDER — NITROGLYCERIN 0.4 MG SL SUBL: 0.4000 mg | SUBLINGUAL_TABLET | SUBLINGUAL | Status: DC | PRN

## 2022-07-12 MED ORDER — HEPARIN (PORCINE) 25000 UT/250ML-% IV SOLN: 10.0000 [IU]/kg/h | INTRAVENOUS | Status: DC

## 2022-07-12 MED ORDER — INSULIN DETEMIR 100 UNIT/ML ~~LOC~~ SOLN
10.0000 [IU] | Freq: Every day | SUBCUTANEOUS | Status: DC
  Administered 2022-07-12 – 2022-07-15 (×4): 10 [IU] via SUBCUTANEOUS
  Filled 2022-07-12 (×6): qty 0.1

## 2022-07-12 NOTE — Progress Notes (Signed)
PROGRESS NOTE    Jose Beck  [id number] DOB: [birth year] DOA: 07/11/2022 PCP: Merryl Hacker, No   Brief Narrative:   Jose Beck is a 77 y.o. male with medical history significant of CAD s/p known occlusion of LAD and RCA by cath in 1992 with cath in 2017 showing similar findings with collateral flow noted, chronic combined systolic and diastolic CHF, HTN, HLD, Type 2 DM, GERD, AAA s/p repair complicated by endo leak followed by CT surgery and vascular and prior CVA as well as Afib on Eliquis. Patient presents to ED BIB EMS with complaints of shaking chills. Patient also noted to have n/v in the field and per wife has been ill x 1 week   HPI: Jose Beck is a 77 y.o. male with medical history significant of CAD s/p known occlusion of LAD and RCA by cath in 1992 with cath in 2017 showing similar findings with collateral flow noted, chronic combined systolic and diastolic CHF, HTN, HLD, Type 2 DM, GERD, AAA s/p repair complicated by endo leak followed by CT surgery and vascular and prior CVA as well as Afib on Eliquis. Patient presents to ED BIB EMS with complaints of shaking chills. Patient also noted to have n/v in the field and per wife has been ill x 1 week with  cough/ uri symptoms,intermittent n/v and fatigue. Per wife patient seen by pcp and given supportive treatment of uri symptoms. Patient was also tested for flu and COVID which was noted to be negative.  Patient over the week had not improved and on day of presentation started having shaking chills. Due to this EMS was called. Patient currently notes no chest pain , no current n/v/ no abdominal pain , still had intermittent cough that is not productive.     ED Course:  Wbc 11.1, hgb 10.5 (12.8.1/24),plt 120 mcv 97.2 Na 141, K 3.7, glu 173, cr 1.8 (base 1.1 , ( 1.49, 1.3, 1.8 trend since 12/23) Lipase 43 Lactic 1.8. CT/ab pelvis Moderate hydronephrosis bilaterally with no obstructing stone.the urinary bladder is markedly  distended in the prostate gland is enlarged, suggesting bladder outlet obstruction. Perinephric fat stranding is noted bilaterally and clinical correlation is recommended to exclude superimposed infection.Morphologic changes of cirrhosis and portal hypertension.Multiple enlarged lymph nodes in the retroperitoneum and left pelvic side wall, which may be infectious, inflammatory, or neoplastic.Expansile lucency with cortical breakthrough in the inferior pubic ramus on the right. The possibility of metastatic disease can not be excluded. MRI is suggested for further evaluation.Aortic atherosclerosis with aneurysmal dilatation of the infrarenal abdominal aorta measuring up to 6.3 cm with endograft in place, not significantly changed from the prior exam.Multi-vessel coronary artery calcifications.  Assessment & Plan:   Sepsis due to UTI -Patient met sepsis criteria upon admission with fever, tachycardia, tachypnea.  Found to have Acute Urinary retention with BOO and B/L hydronephrosis in setting of BPH/history of prostate malignancy -S/p Foley placement in ED.  Continue Rocephin.  Urine culture growing E. coli with no resistance. -continue flomax  -Follow-up with urology outpatient -he sees urology at [facility]   AKI  -multifactorial due to BOO as well as infection  - s/p foley placement.  Avoid nephrotoxic medications   Elevated troponin: -Likely in the setting of demand ischemia. -Patient denies ACS symptoms. -per cardiology Dr Shirlee Latch believe demand related to sepsis  -Troponin initially trended up however now it is trending down.  Will continue to trend. -Continue heparin gtt.  -will consult cardiology   Afib -  on Eliquis and metoprolol at home.  Currently on heparin drip - currently rate controlled.  Continue metoprolol.   Chronic combined diastolic/systolic heart failure -appears compensated  -on Lasix at home.  Continue to hold due to AKI.  Strict INO's and daily weights.  Monitor  signs of fluid overload -Followed by cardiology outpatient  Hypomagnesemia: Replenished.  Repeat magnesium tomorrow a.m.   Abdominal Aortic Aneurysm  -s/p repair complicated by endoleak CT noted GM 6.2 stable from prior  -Followed by CT surgery and vascular outpatient   DMII, insulin dependent -A1c is pending. -resume lantus 10 unts sq -Sliding scale insulin.  Monitor blood sugar   Metastatic prostate CA  -Not felt to be a candidate for hormone therapy due to history of stroke and currently therapy options on hold  -Followed by urology at [facility]   Hypertension  -stable  continue metoprolol   Chronic left shoulder pain  -supportive care  -due to chronic dislocation   DVT prophylaxis: Heparin Code Status: Full code Family Communication: Patient's wife present at bedside.  Plan of care discussed with patient in length and he verbalized understanding and agreed with it. Disposition Plan: To be determined  Consultants:  Cardiology  Procedures:  None  Antimicrobials:  Rocephin  Status is: Inpatient    Subjective: He is doing better.  No chest pain, shortness of breath, palpitations, chills, abdominal pain.  Remained afebrile overnight.  No acute events overnight.  Patient has follow-up appointment with PCP on Monday and vascular surgery as well as urology at the [facility] in this month  Objective: Vitals:   07/12/22 0330 07/12/22 0451 07/12/22 0729 07/12/22 0800  BP: (!) 142/76 (!) 146/82 128/73 135/74  Pulse: 85  80 93  Resp: 19 20 (!) 27 18  Temp:  98.6 F (37 C) 98.8 F (37.1 C)   TempSrc:  Oral Oral   SpO2: 99% 96% 96% 95%  Weight: 93.4 kg 92.6 kg    Height: '5\' 8"'$  (1.727 m) '5\' 8"'$  (1.727 m)      Intake/Output Summary (Last 24 hours) at 07/12/2022 0928 Last data filed at 07/12/2022 0800 Gross per 24 hour  Intake 114.29 ml  Output 3000 ml  Net -2885.71 ml   Filed Weights   07/12/22 0330 07/12/22 0451  Weight: 93.4 kg 92.6 kg     Examination:  General exam: Appears calm and comfortable, on room air, communicating well Respiratory system: Clear to auscultation. Respiratory effort normal. Cardiovascular system: S1 & S2 heard, RRR. No JVD, murmurs, rubs, gallops or clicks. No pedal edema. Gastrointestinal system: Abdomen is nondistended, soft and nontender. No organomegaly or masses felt. Normal bowel sounds heard. Foley in place. Central nervous system: Alert and oriented.  Following commands Extremities: Unable to lift right arm due to chronic right shoulder pain/dislocation Skin: No rashes, lesions or ulcers Psychiatry: Judgement and insight appear normal. Mood & affect appropriate.    Data Reviewed: I have personally reviewed following labs and imaging studies  CBC: Recent Labs  Lab 07/11/22 1940 07/12/22 0326 07/12/22 0554  WBC 11.1* 10.1 10.1  NEUTROABS  --  9.2*  --   HGB 10.5* 9.9* 9.7*  HCT 31.5* 30.2* 28.9*  MCV 97.2 98.1 95.1  PLT 120* 109* [id number]*   Basic Metabolic Panel: Recent Labs  Lab 07/11/22 1940 07/12/22 0326  NA 141 139  K 3.7 3.8  CL 103 103  CO2 22 23  GLUCOSE 173* 165*  BUN 38* 37*  CREATININE 1.80* 1.77*  CALCIUM 8.9  8.7*  MG  --  1.6*   GFR: Estimated Creatinine Clearance: 39.2 mL/min (A) (by C-G formula based on SCr of 1.77 mg/dL (H)). Liver Function Tests: Recent Labs  Lab 07/11/22 1940 07/12/22 0326  AST 23 73*  ALT 28 29  ALKPHOS 61 55  BILITOT 0.8 0.6  PROT 7.1 7.1  ALBUMIN 3.1* 2.9*   Recent Labs  Lab 07/11/22 1940  LIPASE 43   No results for input(s): "AMMONIA" in the last 168 hours. Coagulation Profile: No results for input(s): "INR", "PROTIME" in the last 168 hours. Cardiac Enzymes: No results for input(s): "CKTOTAL", "CKMB", "CKMBINDEX", "TROPONINI" in the last 168 hours. BNP (last 3 results) No results for input(s): "PROBNP" in the last 8760 hours. HbA1C: No results for input(s): "HGBA1C" in the last 72 hours. CBG: Recent Labs  Lab  07/12/22 0451 07/12/22 0734  GLUCAP 142* 154*   Lipid Profile: No results for input(s): "CHOL", "HDL", "LDLCALC", "TRIG", "CHOLHDL", "LDLDIRECT" in the last 72 hours. Thyroid Function Tests: Recent Labs    07/12/22 0326  TSH 0.673   Anemia Panel: No results for input(s): "VITAMINB12", "FOLATE", "FERRITIN", "TIBC", "IRON", "RETICCTPCT" in the last 72 hours. Sepsis Labs: Recent Labs  Lab 07/11/22 1940  LATICACIDVEN 1.8    Recent Results (from the past 240 hour(s))  Culture, blood (routine x 2)     Status: None (Preliminary result)   Collection Time: 07/11/22  7:52 PM   Specimen: BLOOD LEFT FOREARM  Result Value Ref Range Status   Specimen Description BLOOD LEFT FOREARM  Final   Special Requests   Final    BOTTLES DRAWN AEROBIC AND ANAEROBIC Blood Culture adequate volume   Culture  Setup Time   Final    GRAM NEGATIVE RODS ANAEROBIC BOTTLE ONLY Organism ID to follow Performed at [facility], [place] [address], [place], [place] [address]    Culture GRAM NEGATIVE RODS  Final   Report Status PENDING  Incomplete  Resp panel by RT-PCR (RSV, Flu A&B, Covid) Anterior Nasal Swab     Status: None   Collection Time: 07/11/22  8:02 PM   Specimen: Anterior Nasal Swab  Result Value Ref Range Status   SARS Coronavirus 2 by RT PCR NEGATIVE NEGATIVE Final   Influenza A by PCR NEGATIVE NEGATIVE Final   Influenza B by PCR NEGATIVE NEGATIVE Final    Comment: (NOTE) The Xpert Xpress SARS-CoV-2/FLU/RSV plus assay is intended as an aid in the diagnosis of influenza from Nasopharyngeal swab specimens and should not be used as a sole basis for treatment. Nasal washings and aspirates are unacceptable for Xpert Xpress SARS-CoV-2/FLU/RSV testing.  Fact Sheet for Patients: EntrepreneurPulse.com.au  Fact Sheet for Healthcare Providers: IncredibleEmployment.be  This test is not yet approved or cleared by the [place] FDA and has been authorized for  detection and/or diagnosis of SARS-CoV-2 by FDA under an Emergency Use Authorization (EUA). This EUA will remain in effect (meaning this test can be used) for the duration of the COVID-19 declaration under Section 564(b)(1) of the Act, 21 U.S.C. section 360bbb-3(b)(1), unless the authorization is terminated or revoked.     Resp Syncytial Virus by PCR NEGATIVE NEGATIVE Final    Comment: (NOTE) Fact Sheet for Patients: EntrepreneurPulse.com.au  Fact Sheet for Healthcare Providers: IncredibleEmployment.be  This test is not yet approved or cleared by the [place] FDA and has been authorized for detection and/or diagnosis of SARS-CoV-2 by FDA under an Emergency Use Authorization (EUA). This EUA will remain in effect (meaning  this test can be used) for the duration of the COVID-19 declaration under Section 564(b)(1) of the Act, 21 U.S.C. section 360bbb-3(b)(1), unless the authorization is terminated or revoked.  Performed at [facility], [place] [address], [place], [place] [address]   MRSA Next Gen by PCR, Nasal     Status: None   Collection Time: 07/12/22  4:40 AM   Specimen: Nasal Mucosa; Nasal Swab  Result Value Ref Range Status   MRSA by PCR Next Gen NOT DETECTED NOT DETECTED Final    Comment: (NOTE) The GeneXpert MRSA Assay (FDA approved for NASAL specimens only), is one component of a comprehensive MRSA colonization surveillance program. It is not intended to diagnose MRSA infection nor to guide or monitor treatment for MRSA infections. Test performance is not FDA approved in patients less than 74 years old. Performed at [facility], [place] [address] [place],  [address]       Radiology Studies: CT CHEST ABDOMEN PELVIS WO CONTRAST  Result Date: 07/12/2022 CLINICAL DATA:  Sepsis.  Chills and shaking. EXAM: CT CHEST, ABDOMEN AND PELVIS WITHOUT CONTRAST TECHNIQUE: Multidetector CT imaging of the chest, abdomen and  pelvis was performed following the standard protocol without IV contrast. RADIATION DOSE REDUCTION: This exam was performed according to the departmental dose-optimization program which includes automated exposure control, adjustment of the mA and/or kV according to patient size and/or use of iterative reconstruction technique. COMPARISON:  01/20/2022. FINDINGS: CT CHEST FINDINGS Cardiovascular: The heart is normal in size and there is no pericardial effusion. Three-vessel coronary artery calcifications are noted. There is atherosclerotic calcification of the aorta without evidence of aneurysm. The pulmonary trunk is normal in caliber. Mediastinum/Nodes: No mediastinal or axillary lymphadenopathy. Evaluation of the hila is limited due to lack of IV contrast. The thyroid gland, trachea, and esophagus are within normal limits. There is a small to moderate hiatal hernia. Lungs/Pleura: Atelectasis is noted bilaterally. No effusion or pneumothorax. Musculoskeletal: There is bony deformity of the humeral head on the right, compatible with old healed fracture. Degenerative changes are present in the thoracic spine. Stable compression deformity is present at T12. No acute or suspicious osseous abnormality. CT ABDOMEN PELVIS FINDINGS Hepatobiliary: No focal abnormality. The liver has a nodular contour, compatible with underlying cirrhosis. No biliary ductal dilatation. The gallbladder is without stones. Pancreas: Unremarkable. No pancreatic ductal dilatation or surrounding inflammatory changes. Spleen: The spleen is enlarged measuring 15 cm. Adrenals/Urinary Tract: No adrenal nodule or mass. Vascular calcifications are noted at the renal hila bilaterally. No renal calculi. Perinephric fat stranding is noted bilaterally, greater on the left than on the right. There is moderate hydroureteronephrosis bilaterally with no evidence of obstructing stone. The bladder is markedly distended. Stomach/Bowel: Small to moderate hiatal  hernia. Stomach is within normal limits. Appendix is not seen. No evidence of bowel wall thickening, distention, or inflammatory changes. No free air or pneumatosis. Vascular/Lymphatic: Aortic atherosclerosis. There is aneurysmal dilatation of the infrarenal abdominal aorta with endograft in place. The aneurysm sac measures 4.8 x 6.3 cm, not significantly changed from the prior exam. There has an enlarged lymph node in the retroperitoneum measuring 1.5 cm, increased from 9 mm on the prior exam. An enlarged lymph node is noted in the along the pelvic wall on the left measuring 1.6 cm. Reproductive: The prostate gland is enlarged. Other: No abdominopelvic ascites. Musculoskeletal: No acute fracture. Degenerative changes are present in the lumbar spine. There is a lucency with cortical breakthrough in the inferior pubic  ramus on the right measuring 1.8 cm. Stable hemangioma at L1. IMPRESSION: 1. Moderate hydronephrosis bilaterally with no obstructing stone. The urinary bladder is markedly distended in the prostate gland is enlarged, suggesting bladder outlet obstruction. Perinephric fat stranding is noted bilaterally and clinical correlation is recommended to exclude superimposed infection. 2. Morphologic changes of cirrhosis and portal hypertension. 3. Multiple enlarged lymph nodes in the retroperitoneum and left pelvic side wall, which may be infectious, inflammatory, or neoplastic. 4. Expansile lucency with cortical breakthrough in the inferior pubic ramus on the right. The possibility of metastatic disease can not be excluded. MRI is suggested for further evaluation. 5. Aortic atherosclerosis with aneurysmal dilatation of the infrarenal abdominal aorta measuring up to 6.3 cm with endograft in place, not significantly changed from the prior exam. 6. Multi-vessel coronary artery calcifications. 7. Remaining incidental findings as described above. Electronically Signed   By: Brett Fairy M.D.   On: 07/12/2022 00:03    DG Chest Port 1 View  Result Date: 07/11/2022 CLINICAL DATA:  Cough and chills. EXAM: PORTABLE CHEST 1 VIEW COMPARISON:  04/29/2022. FINDINGS: Heart is enlarged and the mediastinal contour is within normal limits. There is atherosclerotic calcification of the aorta. Lung volumes are low. No consolidation, effusion, or pneumothorax. There is chronic inferior subluxation and bony deformity of the left humeral head. No acute osseous abnormality. IMPRESSION: 1. No active disease. 2. Cardiomegaly. Electronically Signed   By: Brett Fairy M.D.   On: 07/11/2022 20:31    Scheduled Meds:  atorvastatin  80 mg Oral q1800   insulin aspart  0-6 Units Subcutaneous Q4H   insulin detemir  10 Units Subcutaneous Daily   tamsulosin  0.4 mg Oral Daily   Continuous Infusions:  cefTRIAXone (ROCEPHIN)  IV     heparin 1,200 Units/hr (07/12/22 0600)     LOS: 0 days   Time spent: 35 minutes   Gibson Telleria Loann Quill, MD [facility]  If 7PM-7AM, please contact night-coverage [email or web address] 07/12/2022, 9:28 AM

## 2022-07-12 NOTE — Consult Note (Signed)
Cardiology Consultation   Patient ID: Abhimanyu Strzelczyk MRN: [id number]; DOB: [birth year]  Admit date: 07/11/2022 Date of Consult: 07/12/2022  PCP:  Kathyrn Lass   [facility] Providers Cardiologist:  Peter Martinique, MD        Patient Profile:   Quy Munro is a 77 y.o. male with a hx of CAD s/p known occlusion of LAD and RCA by cath in 1992 with cath in 2017 showing similar findings with collateral flow noted, chronic combined systolic and diastolic CHF, HTN, HLD, Type 2 DM, GERD, AAA s/p repair complicated by endo leak followed by CT surgery and vascular and prior CVA as well as Afib on Eliquis now admitted 07/11/22 with chills, shaking N/V  who is being seen 07/12/2022 for the evaluation of NSTEMI at the request of Dr. Doristine Bosworth.Marland Kitchen  History of Present Illness:   Mr. Rajaram with hx as above and last cardiac cath 2017 with similar findings of  occlusion of LAD and RCA by cath in 1992.   Echo 04/2022 with EF 45-50% mild concentric LVH, and ant and apical WMA-felt to be demand ischemia with known CAD.  G1DD.trivial MR, The aortic valve was not well visualized. There is severe calcifcation of the aortic valve. Non-diasgnostic aortic valve doppler this was done with admit for a fib RVR.  Hs troponin to 1185, converted to SR on IV amiodarone.  Given high bleeding risk suggested taking DOAC alone without antiplatelet therapy. Plavix was stopped   attempt to add farxiga and entresto but pt preferred no new meds.  (Pt fell in 12/2021 and and had traumatic acute subarachnoid hemorrhage and acute subdural hematoma.  Seen by neuro. Placed on prophylactic Keppra.   Saw Dr. Martinique 05/23/22 and was stable.  His ARB had been stopped due to worsened renal function.  With increased weakness his amiodarone was stopped.  He was maintaining SR.  Dr. Martinique recommended Tikosyn if a fib recurs.    Prior  MRI demonstrated a 4.6 cm AAA. Seen by Dr. Trula Slade and US showed 3.6 cm AAA. CTA June showed increase size  to 5.7 cm. He subsequently underwent endovascular repair on 11/25/17.  There was concern for enlarging AAA (from postoperative 5.4 to 6.2 cm and endovascular leak). This is followed by serial CT by [facility].   Pt presented to ER 07/11/22 with chills, shaking N/V.  No chest pain, + intermittent cough non productive.    PCXR NAD + cardiomegaly  CT/ab pelvis  IMPRESSION: 1. Moderate hydronephrosis bilaterally with no obstructing stone. The urinary bladder is markedly distended in the prostate gland is enlarged, suggesting bladder outlet obstruction. Perinephric fat stranding is noted bilaterally and clinical correlation is recommended to exclude superimposed infection. 2. Morphologic changes of cirrhosis and portal hypertension. 3. Multiple enlarged lymph nodes in the retroperitoneum and left pelvic side wall, which may be infectious, inflammatory, or neoplastic. 4. Expansile lucency with cortical breakthrough in the inferior pubic ramus on the right. The possibility of metastatic disease can not be excluded. MRI is suggested for further evaluation. 5. Aortic atherosclerosis with aneurysmal dilatation of the infrarenal abdominal aorta measuring up to 6.3 cm with endograft in place, not significantly changed from the prior exam. 6. Multi-vessel coronary artery calcifications. 7. Remaining incidental findings as described above.  Hs troponin on admit 5,092 >>16,169>>14,490>>12,829>>13,452 BNP 607.7 WBC 11.1 Hgb 10.5 pts 120 Na 141, K+ 3.7 BUN 38 Cr 1.80  albumin 3.1  Cr today 1.77 Mg+ 1.6 (replaced with 2 Gm)  + UTI +  Nitrites, large leukocytes  EKG:  The EKG was personally reviewed and demonstrates:  ST at 59 with old ant sept MI with Q waves and ST depression I but similar to 05/17/22 EKG  follow up EKG later same day with SR and improved ischemia but axis change.? Lead positioning Telemetry:  Telemetry was personally reviewed and demonstrates:  SR occ PVCs and a 10 best run NSVT  BP 136/70 P  86 R 23 afebrile.   Found with acute urinary retention and UTI.  Urine culture E. Coli BOO and B/L hydronephrosis in setting of BPH/history of prostate malignancy  foley placed.   Denies any chest pain recently, some SOB and PCP adjusts lasix with edema.   Past Medical History:  Diagnosis Date   Abdominal aortic aneurysm (AAA) ([place])    Aortic stenosis    a. 11/2015 Echo: EF 55-60%, Gr1 DD, mild AS.   Chronic combined systolic and diastolic CHF (congestive heart failure) ([place])    a. 07/2015: TEE showing EF of 40% b. 11/2015: Echo w/ EF 55-60%, Grade 1 DD, mild AS.   Coronary artery disease    a. patient reports 100% stenosis of LAD and RCA by cath in 1999. b. 11/2015 Cath: LM nl, LAD 100 - fills by L->L collats from D1, LCX 60d, OM1 lalrge/nl, OM2 small/nl, RCA 155m L->R collats.EF 35-45% (55-60% by echo).   Diabetes mellitus without complication ([place]    Type II   GERD (gastroesophageal reflux disease)    Hyperlipemia    Hypertension    Hypertensive heart disease    Stroke ([place]    a. 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   Thrombocytopenia ([place] 11/2015    Past Surgical History:  Procedure Laterality Date   ABDOMINAL AORTIC ENDOVASCULAR STENT GRAFT N/A 11/25/2017   Procedure: ABDOMINAL AORTIC ENDOVASCULAR STENT GRAFT;  Surgeon: BSerafina Mitchell MD;  Location: [facility];  Service: Vascular;  Laterality: N/A;   CARDIAC CATHETERIZATION     CARDIAC CATHETERIZATION N/A 11/16/2015   Procedure: Left Heart Cath and Coronary Angiography;  Surgeon: HBelva Crome MD;  [facility];  Service: Cardiovascular;  Laterality: N/A;   MOLE REMOVAL     TONSILLECTOMY       Home Medications:  Prior to Admission medications   Medication Sig Start Date End Date Taking? Authorizing Provider  acetaminophen (TYLENOL) 650 MG CR tablet Take 650 mg by mouth in the morning, at noon, and at bedtime.   Yes [provider]  apixaban (ELIQUIS) 5 MG TABS tablet TAKE 1 TABLET BY MOUTH  TWICE A DAY 06/10/22  Yes JMartinique Peter M, MD  atorvastatin (LIPITOR) 80 MG tablet Take 1 tablet (80 mg total) by mouth daily at 6 PM. 04/25/16  Yes JMartinique Peter M, MD  calcium-vitamin D 250-100 MG-UNIT tablet Take 1 tablet by mouth daily. Takes in morning '600mg'$    Yes [provider]  furosemide (LASIX) 40 MG tablet Take 20 mg by mouth daily. 06/26/22  Yes [provider]  insulin detemir (LEVEMIR) 100 UNIT/ML injection Inject 10 Units into the skin daily.   Yes [provider]  KLOR-CON M20 20 MEQ tablet Take 10 mEq by mouth daily. 06/26/22  Yes [provider]  metoprolol succinate (TOPROL-XL) 25 MG 24 hr tablet Take 1 tablet (25 mg total) by mouth daily. PLEASE KEEP SCHEDULED APPOINTMENT WITH CARDIOLOGIST 06/20/22  Yes JMartinique Peter M, MD  nitroGLYCERIN (NITROSTAT) 0.4 MG SL tablet Place 0.4 mg under the tongue every  5 (five) minutes as needed for chest pain.   Yes [provider]  tacrolimus (PROTOPIC) 0.1 % ointment Apply 1 application topically daily as needed (facial scaling).   Yes [provider]  tamsulosin (FLOMAX) 0.4 MG CAPS capsule Take 0.4 mg by mouth daily. 05/21/22  Yes [provider]    Inpatient Medications: Scheduled Meds:  atorvastatin  80 mg Oral q1800   Chlorhexidine Gluconate Cloth  6 each Topical Daily   insulin aspart  0-6 Units Subcutaneous Q4H   insulin detemir  10 Units Subcutaneous Daily   metoprolol succinate  25 mg Oral Daily   tamsulosin  0.4 mg Oral Daily   Continuous Infusions:  cefTRIAXone (ROCEPHIN)  IV     heparin 1,350 Units/hr (07/12/22 1257)   PRN Meds: acetaminophen **OR** acetaminophen, albuterol, nitroGLYCERIN, ondansetron **OR** ondansetron (ZOFRAN) IV  Allergies:    Allergies  Allergen Reactions   Cheese Nausea And Vomiting   Pravastatin Anxiety    Anxiety attack    Social History:   Social History   Socioeconomic History   Marital status: Married    Spouse name: [name]    Number of children: 0   Years of education: College   Highest education level: Not on file  Occupational History   Occupation: Retired   Tobacco Use   Smoking status: Never   Smokeless tobacco: Never  Vaping Use   Vaping Use: Never used  Substance and Sexual Activity   Alcohol use: Yes    Alcohol/week: 1.0 standard drink of alcohol    Types: 1 Shots of liquor per week    Comment: cocktails every day   Drug use: No   Sexual activity: Not on file  Other Topics Concern   Not on file  Social History Narrative   Drinks coffee daily    Social Determinants of Health   Financial Resource Strain: Not on file  Food Insecurity: No Food Insecurity (07/12/2022)   Hunger Vital Sign    Worried About Running Out of Food in the Last Year: Never true    Ran Out of Food in the Last Year: Never true  Transportation Needs: No Transportation Needs (07/12/2022)   PRAPARE - Hydrologist (Medical): No    Lack of Transportation (Non-Medical): No  Physical Activity: Not on file  Stress: Not on file  Social Connections: Not on file  Intimate Partner Violence: Not At Risk (07/12/2022)   Humiliation, Afraid, Rape, and Kick questionnaire    Fear of Current or Ex-Partner: No    Emotionally Abused: No    Physically Abused: No    Sexually Abused: No    Family History:    Family History  Problem Relation Age of Onset   Hypertension Mother    Heart disease Mother      ROS:  Please see the history of present illness.  General:no colds or fevers, no weight changes Skin:no rashes or ulcers HEENT:no blurred vision, no congestion CV:see HPI PUL:see HPI GI:no diarrhea constipation or melena, no indigestion GU:no hematuria, no dysuria MS:no joint pain, no claudication Neuro:no syncope, no lightheadedness Endo:no diabetes, no thyroid disease  All other ROS reviewed and negative.     Physical Exam/Data:   Vitals:   07/12/22 0900 07/12/22 1000 07/12/22 1100 07/12/22 1142   BP: 127/72 136/70 129/72 136/77  Pulse: 86 88 94 94  Resp: (!) 23 (!) 22 (!) 29 (!) 23  Temp:    98.7 F (37.1 C)  TempSrc:  Oral  SpO2: 95% 96% 94% 95%  Weight:      Height:        Intake/Output Summary (Last 24 hours) at 07/12/2022 1317 Last data filed at 07/12/2022 1300 Gross per 24 hour  Intake 114.29 ml  Output 4100 ml  Net -3985.71 ml      07/12/2022    4:51 AM 07/12/2022    3:30 AM 05/28/2022    2:05 PM  Last 3 Weights  Weight (lbs) 204 lb 2.3 oz 206 lb 206 lb  Weight (kg) 92.6 kg 93.441 kg 93.441 kg     Body mass index is 31.04 kg/m.  General:  thin, in no acute distress  HEENT: normal Neck: no JVD Vascular: + carotid bruits; Distal pulses 2+ bilaterally Cardiac:  normal S1, S2; RRR; + systolic murmur aortic no gallup Lungs:  few rales to auscultation bilaterally, no wheezing, rhonchi   Abd: soft, nontender, no hepatomegaly  Ext: no to trace edema Musculoskeletal:  No deformities, BUE and BLE strength normal and equal Skin: warm and dry , bruising on face Rt face Neuro:  alert and oriented X 3, no focal abnormalities noted Psych:  Normal affect    Relevant CV Studies: Cardiac cath 2017  Mid RCA lesion, 100% stenosed. Ost 1st Diag to 1st Diag lesion, 70% stenosed. Mid LAD lesion, 100% stenosed. Dist RCA lesion, 100% stenosed. Dist Cx lesion, 60% stenosed.   Total occlusion of the proximal RCA. RCA is heavily calcified. RCA fills by collaterals from the circumflex coronary of the left system. The right coronary is large in distribution. Total occlusion of the proximal to mid LAD after the origin of the first of perforator and first diagonal. Left to left collateral supply the LAD and a second diagonal. The first diagonal contains eccentric 50-70% narrowing. Widely patent circumflex including a small ramus intermedius/first obtuse marginal, and a large branching second obtuse marginal. The distal circumflex beyond the second marginal is small and contains 50-70%  narrowing. The circumflex is the source of collaterals to the distal right coronary. Mildly depressed LV function with estimated EF in the 35 to 40% range. LVEDP is mildly elevated. The inferior wall is hypokinetic.     RECOMMENDATIONS:   Images were reviewed with Dr. Martinique. The plan at this time is to continue medical therapy unless symptoms progress. Aggressive risk factor modification. Heart failure therapy as indicated. Diagnostic Dominance: Right   Laboratory Data:  High Sensitivity Troponin:   Recent Labs  Lab 07/11/22 2237 07/12/22 0326 07/12/22 0554 07/12/22 0852 07/12/22 1044  TROPONINIHS 5,092* 16,169* 14,490* 12,829* 13,452*     Chemistry Recent Labs  Lab 07/11/22 1940 07/12/22 0326  NA 141 139  K 3.7 3.8  CL 103 103  CO2 22 23  GLUCOSE 173* 165*  BUN 38* 37*  CREATININE 1.80* 1.77*  CALCIUM 8.9 8.7*  MG  --  1.6*  GFRNONAA 39* 39*  ANIONGAP 16* 13    Recent Labs  Lab 07/11/22 1940 07/12/22 0326  PROT 7.1 7.1  ALBUMIN 3.1* 2.9*  AST 23 73*  ALT 28 29  ALKPHOS 61 55  BILITOT 0.8 0.6   Lipids No results for input(s): "CHOL", "TRIG", "HDL", "LABVLDL", "LDLCALC", "CHOLHDL" in the last 168 hours.  Hematology Recent Labs  Lab 07/11/22 1940 07/12/22 0326 07/12/22 0554  WBC 11.1* 10.1 10.1  RBC 3.24* 3.08* 3.04*  HGB 10.5* 9.9* 9.7*  HCT 31.5* 30.2* 28.9*  MCV 97.2 98.1 95.1  MCH 32.4 32.1 31.9  MCHC 33.3  32.8 33.6  RDW 13.9 14.2 14.2  PLT 120* 109* 106*   Thyroid  Recent Labs  Lab 07/12/22 0326  TSH 0.673    BNP Recent Labs  Lab 07/12/22 0326  BNP 607.7*    DDimer No results for input(s): "DDIMER" in the last 168 hours.   Radiology/Studies:  ECHOCARDIOGRAM COMPLETE  Result Date: 07/12/2022    ECHOCARDIOGRAM REPORT   Patient Name:   ARISTOTLE STELLWAGEN Date of Exam: 07/12/2022 Medical Rec #:  [id number]           Height:       68.0 in Accession #:    [id number]          Weight:       204.1 lb Date of Birth:  1945-11-16           BSA:          2.062 m Patient Age:    73 years            BP:           129/72 mmHg Patient Gender: M                   HR:           90 bpm. Exam Location:  Inpatient Procedure: 2D Echo, Cardiac Doppler, Color Doppler and Intracardiac            Opacification Agent Indications:    NSTEMI I21.4  History:        Patient has prior history of Echocardiogram examinations, most                 recent 04/28/2022. CHF, CAD; Arrythmias:Atrial Fibrillation.  Sonographer:    Luane School RDCS Referring Phys: [id number] [facility] A TH[facility]  Sonographer Comments: Suboptimal apical window and suboptimal subcostal window. IMPRESSIONS  1. Left ventricular ejection fraction, by estimation, is 30 to 35%. The left ventricle has moderately decreased function. The left ventricle demonstrates regional wall motion abnormalities (see scoring diagram/findings for description). There is mild left ventricular hypertrophy. Left ventricular diastolic parameters are consistent with Grade II diastolic dysfunction (pseudonormalization). Elevated left atrial pressure.  2. Right ventricular systolic function is normal. The right ventricular size is mildly enlarged. There is normal pulmonary artery systolic pressure. The estimated right ventricular systolic pressure is [id number] mmHg.  3. Left atrial size was mildly dilated.  4. The mitral valve is normal in structure. Mild mitral valve regurgitation.  5. The inferior vena cava is normal in size with greater than 50% respiratory variability, suggesting right atrial pressure of 3 mmHg.  6. The aortic valve is calcified. Aortic valve regurgitation is not visualized. Severe aortic valve stenosis. Moderate AS by gradients (MG 25 mmHg, Vmax 3.2 m/s), severe by AVA (0.9cm^2) and DI (0.25). Low SV index (26 cc/m^2), suspect low flow low gradient severe AS FINDINGS  Left Ventricle: Left ventricular ejection fraction, by estimation, is 30 to 35%. The left ventricle has moderately decreased function. The left ventricle  demonstrates regional wall motion abnormalities. Definity contrast agent was given IV to delineate the left ventricular endocardial borders. The left ventricular internal cavity size was normal in size. There is mild left ventricular hypertrophy. Left ventricular diastolic parameters are consistent with Grade II diastolic dysfunction (pseudonormalization). Elevated left atrial pressure.  LV Wall Scoring: The mid and distal anterior septum, mid and distal inferior wall, mid inferoseptal segment, apical anterior segment, and apex are akinetic. The anterior wall, entire lateral wall, basal anteroseptal segment,  basal inferior segment, and basal inferoseptal segment are normal. Right Ventricle: The right ventricular size is mildly enlarged. No increase in right ventricular wall thickness. Right ventricular systolic function is normal. There is normal pulmonary artery systolic pressure. The tricuspid regurgitant velocity is 2.72  m/s, and with an assumed right atrial pressure of 3 mmHg, the estimated right ventricular systolic pressure is [id number] mmHg. Left Atrium: Left atrial size was mildly dilated. Right Atrium: Right atrial size was normal in size. Pericardium: There is no evidence of pericardial effusion. Mitral Valve: The mitral valve is normal in structure. Mild mitral valve regurgitation. Tricuspid Valve: The tricuspid valve is normal in structure. Tricuspid valve regurgitation is trivial. Aortic Valve: The aortic valve is calcified. Aortic valve regurgitation is not visualized. Severe aortic stenosis is present. Aortic valve mean gradient measures 25.0 mmHg. Aortic valve peak gradient measures 42.0 mmHg. Aortic valve area, by VTI measures  0.87 cm. Pulmonic Valve: The pulmonic valve was not well visualized. Pulmonic valve regurgitation is trivial. Aorta: The aortic root and ascending aorta are structurally normal, with no evidence of dilitation. Venous: The inferior vena cava is normal in size with greater than  50% respiratory variability, suggesting right atrial pressure of 3 mmHg. IAS/Shunts: The interatrial septum was not well visualized.  LEFT VENTRICLE PLAX 2D LVIDd:         5.50 cm      Diastology LVIDs:         4.00 cm      LV e' medial:    5.98 cm/s LV PW:         1.40 cm      LV E/e' medial:  20.9 LV IVS:        1.40 cm      LV e' lateral:   8.81 cm/s LVOT diam:     2.10 cm      LV E/e' lateral: 14.2 LV SV:         54 LV SV Index:   26 LVOT Area:     3.46 cm  LV Volumes (MOD) LV vol d, MOD A2C: 129.0 ml LV vol d, MOD A4C: 116.0 ml LV vol s, MOD A2C: 81.8 ml LV vol s, MOD A4C: 80.9 ml LV SV MOD A2C:     47.2 ml LV SV MOD A4C:     116.0 ml LV SV MOD BP:      45.2 ml RIGHT VENTRICLE             IVC RV S prime:     16.20 cm/s  IVC diam: 1.90 cm TAPSE (M-mode): 2.4 cm LEFT ATRIUM             Index        RIGHT ATRIUM           Index LA diam:        4.90 cm 2.38 cm/m   RA Area:     22.20 cm LA Vol (A2C):   71.9 ml 34.88 ml/m  RA Volume:   60.70 ml  29.44 ml/m LA Vol (A4C):   83.2 ml 40.36 ml/m LA Biplane Vol: 78.1 ml 37.88 ml/m  AORTIC VALVE AV Area (Vmax):    0.83 cm AV Area (Vmean):   0.79 cm AV Area (VTI):     0.87 cm AV Vmax:           324.00 cm/s AV Vmean:          234.000 cm/s AV VTI:  0.627 m AV Peak Grad:      42.0 mmHg AV Mean Grad:      25.0 mmHg LVOT Vmax:         77.50 cm/s LVOT Vmean:        53.600 cm/s LVOT VTI:          0.157 m LVOT/AV VTI ratio: 0.25  AORTA Ao Root diam: 2.90 cm Ao Asc diam:  3.70 cm MITRAL VALVE                TRICUSPID VALVE MV Area (PHT): 5.34 cm     TR Peak grad:   29.6 mmHg MV Decel Time: 142 msec     TR Vmax:        272.00 cm/s MV E velocity: 125.00 cm/s MV A velocity: 97.90 cm/s   SHUNTS MV E/A ratio:  1.28         Systemic VTI:  0.16 m                             Systemic Diam: 2.10 cm Oswaldo Milian MD Electronically signed by Oswaldo Milian MD Signature Date/Time: 07/12/2022/1:08:08 PM    Final    CT CHEST ABDOMEN PELVIS WO CONTRAST  Result  Date: 07/12/2022 CLINICAL DATA:  Sepsis.  Chills and shaking. EXAM: CT CHEST, ABDOMEN AND PELVIS WITHOUT CONTRAST TECHNIQUE: Multidetector CT imaging of the chest, abdomen and pelvis was performed following the standard protocol without IV contrast. RADIATION DOSE REDUCTION: This exam was performed according to the departmental dose-optimization program which includes automated exposure control, adjustment of the mA and/or kV according to patient size and/or use of iterative reconstruction technique. COMPARISON:  01/20/2022. FINDINGS: CT CHEST FINDINGS Cardiovascular: The heart is normal in size and there is no pericardial effusion. Three-vessel coronary artery calcifications are noted. There is atherosclerotic calcification of the aorta without evidence of aneurysm. The pulmonary trunk is normal in caliber. Mediastinum/Nodes: No mediastinal or axillary lymphadenopathy. Evaluation of the hila is limited due to lack of IV contrast. The thyroid gland, trachea, and esophagus are within normal limits. There is a small to moderate hiatal hernia. Lungs/Pleura: Atelectasis is noted bilaterally. No effusion or pneumothorax. Musculoskeletal: There is bony deformity of the humeral head on the right, compatible with old healed fracture. Degenerative changes are present in the thoracic spine. Stable compression deformity is present at T12. No acute or suspicious osseous abnormality. CT ABDOMEN PELVIS FINDINGS Hepatobiliary: No focal abnormality. The liver has a nodular contour, compatible with underlying cirrhosis. No biliary ductal dilatation. The gallbladder is without stones. Pancreas: Unremarkable. No pancreatic ductal dilatation or surrounding inflammatory changes. Spleen: The spleen is enlarged measuring 15 cm. Adrenals/Urinary Tract: No adrenal nodule or mass. Vascular calcifications are noted at the renal hila bilaterally. No renal calculi. Perinephric fat stranding is noted bilaterally, greater on the left than on the  right. There is moderate hydroureteronephrosis bilaterally with no evidence of obstructing stone. The bladder is markedly distended. Stomach/Bowel: Small to moderate hiatal hernia. Stomach is within normal limits. Appendix is not seen. No evidence of bowel wall thickening, distention, or inflammatory changes. No free air or pneumatosis. Vascular/Lymphatic: Aortic atherosclerosis. There is aneurysmal dilatation of the infrarenal abdominal aorta with endograft in place. The aneurysm sac measures 4.8 x 6.3 cm, not significantly changed from the prior exam. There has an enlarged lymph node in the retroperitoneum measuring 1.5 cm, increased from 9 mm on the prior exam. An enlarged lymph node  is noted in the along the pelvic wall on the left measuring 1.6 cm. Reproductive: The prostate gland is enlarged. Other: No abdominopelvic ascites. Musculoskeletal: No acute fracture. Degenerative changes are present in the lumbar spine. There is a lucency with cortical breakthrough in the inferior pubic ramus on the right measuring 1.8 cm. Stable hemangioma at L1. IMPRESSION: 1. Moderate hydronephrosis bilaterally with no obstructing stone. The urinary bladder is markedly distended in the prostate gland is enlarged, suggesting bladder outlet obstruction. Perinephric fat stranding is noted bilaterally and clinical correlation is recommended to exclude superimposed infection. 2. Morphologic changes of cirrhosis and portal hypertension. 3. Multiple enlarged lymph nodes in the retroperitoneum and left pelvic side wall, which may be infectious, inflammatory, or neoplastic. 4. Expansile lucency with cortical breakthrough in the inferior pubic ramus on the right. The possibility of metastatic disease can not be excluded. MRI is suggested for further evaluation. 5. Aortic atherosclerosis with aneurysmal dilatation of the infrarenal abdominal aorta measuring up to 6.3 cm with endograft in place, not significantly changed from the prior exam.  6. Multi-vessel coronary artery calcifications. 7. Remaining incidental findings as described above. Electronically Signed   By: Brett Fairy M.D.   On: 07/12/2022 00:03   DG Chest Port 1 View  Result Date: 07/11/2022 CLINICAL DATA:  Cough and chills. EXAM: PORTABLE CHEST 1 VIEW COMPARISON:  04/29/2022. FINDINGS: Heart is enlarged and the mediastinal contour is within normal limits. There is atherosclerotic calcification of the aorta. Lung volumes are low. No consolidation, effusion, or pneumothorax. There is chronic inferior subluxation and bony deformity of the left humeral head. No acute osseous abnormality. IMPRESSION: 1. No active disease. 2. Cardiomegaly. Electronically Signed   By: Brett Fairy M.D.   On: 07/11/2022 20:31     Assessment and Plan:   NSTEMI/CAD in combination with acute sepsis due to UTI and B/L hydronephrosis in setting of BPH/hx of prostate malignancy. Known significant CAD Total occlusion of the proximal RCA. RCA is heavily calcified. RCA fills by collaterals from the circumflex coronary of the left system. The right coronary is large in distribution. Total occlusion of the proximal to mid LAD after the origin of the first of perforator and first diagonal. Left to left collateral supply the LAD and a second diagonal. The first diagonal contains eccentric 50-70% narrowing.  And Widely patent circumflex including a small ramus intermedius/first obtuse marginal, and a large branching second obtuse marginal. The distal circumflex beyond the second marginal is small and contains 50-70% narrowing. The circumflex is the source of collaterals to the distal right coronary -- may be demand ischemia with significant CAD and sepsis CM/chronic combined systolic and diastolic CHF most recent echo 12/23 with EF 45-50% intolerance of ARB/Entresto with worsened renal function.  [place] held in Jan. On BB.  Also lasix 20 mg daily his PCP adjusts lasix at times. NSVT with Mg+ of 1.6 this AM now  replaced  K+ 3.8  on torpol XL 25  goal Mg+ 2.0 and K+ 4.0 PAF placed on anticoagulation and plavix stopped with hx of fall in IC bleed.  Has been maintaining SR and amiodarone was stopped in Jan due to weakness. On eliquis-but on hold and on heparin. HTN controlled DM-2/sepsis/hx CVA/Hx of ic bleed/AAA per IM    Risk Assessment/Risk Scores:     TIMI Risk Score for Unstable Angina or Non-ST Elevation MI:   The patient's TIMI risk score is 4, which indicates a 20% risk of all cause mortality, new or recurrent  myocardial infarction or need for urgent revascularization in the next 14 days.          For questions or updates, please contact [place] Please consult [email or web address] for contact info under    Signed, Cecilie Kicks, NP  07/12/2022 1:17 PM

## 2022-07-12 NOTE — Progress Notes (Signed)
ANTICOAGULATION CONSULT NOTE  Pharmacy Consult for heparin Indication:  Afib and elevated troponin  Allergies  Allergen Reactions   Cheese Nausea And Vomiting   Pravastatin Anxiety    Anxiety attack    Patient Measurements: Height: '5\' 8"'$  (172.7 cm) Weight: 92.6 kg (204 lb 2.3 oz) IBW/kg (Calculated) : 68.4 Heparin Dosing Weight: 90kg  Vital Signs: Temp: 98.9 F (37.2 C) (03/02 1615) Temp Source: Oral (03/02 1615) BP: 132/68 (03/02 2000) Pulse Rate: 78 (03/02 2000)  Labs: Recent Labs    07/11/22 1940 07/11/22 2237 07/12/22 0326 07/12/22 0554 07/12/22 0852 07/12/22 1044 07/12/22 2013  HGB 10.5*  --  9.9* 9.7*  --   --   --   HCT 31.5*  --  30.2* 28.9*  --   --   --   PLT 120*  --  109* 106*  --   --   --   APTT  --   --   --   --   --  40* 113*  HEPARINUNFRC  --   --   --   --   --  >1.10*  --   CREATININE 1.80*  --  1.77*  --   --   --   --   TROPONINIHS  --    < > 16,169* 14,490* 12,829* 13,452*  --    < > = values in this interval not displayed.     Estimated Creatinine Clearance: 39.2 mL/min (A) (by C-G formula based on SCr of 1.77 mg/dL (H)).   Medical History: Past Medical History:  Diagnosis Date   Abdominal aortic aneurysm (AAA) ([place])    Aortic stenosis    a. 11/2015 Echo: EF 55-60%, Gr1 DD, mild AS.   Chronic combined systolic and diastolic CHF (congestive heart failure) ([place])    a. 07/2015: TEE showing EF of 40% b. 11/2015: Echo w/ EF 55-60%, Grade 1 DD, mild AS.   Coronary artery disease    a. patient reports 100% stenosis of LAD and RCA by cath in 1999. b. 11/2015 Cath: LM nl, LAD 100 - fills by L->L collats from D1, LCX 60d, OM1 lalrge/nl, OM2 small/nl, RCA 166m L->R collats.EF 35-45% (55-60% by echo).   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  Aptt is slightly above goal at 113 seconds.  No known issues with IV infusion.  No overt bleeding or complications noted.  Goal of Therapy:  Heparin level 0.3-0.7 units/ml aPTT 66-102 seconds Monitor platelets by anticoagulation protocol: Yes   Plan:  Decrease heparin infusion to1300 units/hr  Monitor heparin levels, aPTT (while apixaban affects anti-Xa assay), and CBC daily.  CAlanda Slim PharmD, FEastern Oregon Regional SurgeryClinical Pharmacist Please see AMION for all Pharmacists' Contact Phone Numbers 07/12/2022, 9:27 PM

## 2022-07-12 NOTE — Progress Notes (Signed)
ANTICOAGULATION CONSULT NOTE  Pharmacy Consult for heparin Indication:  Afib and elevated troponin  Allergies  Allergen Reactions   Cheese Nausea And Vomiting   Pravastatin Anxiety    Anxiety attack    Patient Measurements: Height: '5\' 8"'$  (172.7 cm) Weight: 92.6 kg (204 lb 2.3 oz) IBW/kg (Calculated) : 68.4 Heparin Dosing Weight: 90kg  Vital Signs: Temp: 98.7 F (37.1 C) (03/02 1142) Temp Source: Oral (03/02 1142) BP: 136/77 (03/02 1142) Pulse Rate: 94 (03/02 1142)  Labs: Recent Labs    07/11/22 1940 07/11/22 2237 07/12/22 0326 07/12/22 0554 07/12/22 0852 07/12/22 1044  HGB 10.5*  --  9.9* 9.7*  --   --   HCT 31.5*  --  30.2* 28.9*  --   --   PLT 120*  --  109* 106*  --   --   APTT  --   --   --   --   --  40*  HEPARINUNFRC  --   --   --   --   --  >1.10*  CREATININE 1.80*  --  1.77*  --   --   --   TROPONINIHS  --    < > 16,169* 14,490* 12,829* 13,452*   < > = values in this interval not displayed.     Estimated Creatinine Clearance: 39.2 mL/min (A) (by C-G formula based on SCr of 1.77 mg/dL (H)).   Medical History: Past Medical History:  Diagnosis Date   Abdominal aortic aneurysm (AAA) ([place])    Aortic stenosis    a. 11/2015 Echo: EF 55-60%, Gr1 DD, mild AS.   Chronic combined systolic and diastolic CHF (congestive heart failure) ([place])    a. 07/2015: TEE showing EF of 40% b. 11/2015: Echo w/ EF 55-60%, Grade 1 DD, mild AS.   Coronary artery disease    a. patient reports 100% stenosis of LAD and RCA by cath in 1999. b. 11/2015 Cath: LM nl, LAD 100 - fills by L->L collats from D1, LCX 60d, OM1 lalrge/nl, OM2 small/nl, RCA 14m L->R collats.EF 35-45% (55-60% by echo).   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  Heparin level this morning is falsely elevated as expected after Eliquis.  Aptt is below goal at 40 seconds.  No known issues with IV infusion.  No overt bleeding or complications noted.  Goal of Therapy:  Heparin level 0.3-0.7 units/ml aPTT 66-102 seconds Monitor platelets by anticoagulation protocol: Yes   Plan:  Start heparin infusion at 1350 units/hr (was recently at low-therapeutic goal with this rate). Monitor heparin levels, aPTT (while apixaban affects anti-Xa assay), and CBC.  JNevada Crane PRoylene Reason BCCP Clinical Pharmacist  07/12/2022 12:51 PM   MLighthouse Care Center Of Augustapharmacy phone numbers are listed on [email or web address]

## 2022-07-12 NOTE — Progress Notes (Signed)
ANTICOAGULATION CONSULT NOTE - Initial Consult  Pharmacy Consult for heparin Indication:  Afib and elevated troponin  Allergies  Allergen Reactions   Cheese Nausea And Vomiting   Pravastatin Anxiety    Anxiety attack    Patient Measurements: Height: '5\' 8"'$  (172.7 cm) Weight: 93.4 kg (206 lb) IBW/kg (Calculated) : 68.4 Heparin Dosing Weight: 90kg  Vital Signs: Temp: 100.2 F (37.9 C) (03/01 2148) Temp Source: Oral (03/01 2148) BP: 142/76 (03/02 0330) Pulse Rate: 85 (03/02 0330)  Labs: Recent Labs    07/11/22 1940 07/11/22 2237  HGB 10.5*  --   HCT 31.5*  --   PLT 120*  --   CREATININE 1.80*  --   TROPONINIHS  --  5,092*    Estimated Creatinine Clearance: 38.7 mL/min (A) (by C-G formula based on SCr of 1.8 mg/dL (H)).   Medical History: Past Medical History:  Diagnosis Date   Abdominal aortic aneurysm (AAA) ([place])    Aortic stenosis    a. 11/2015 Echo: EF 55-60%, Gr1 DD, mild AS.   Chronic combined systolic and diastolic CHF (congestive heart failure) ([place])    a. 07/2015: TEE showing EF of 40% b. 11/2015: Echo w/ EF 55-60%, Grade 1 DD, mild AS.   Coronary artery disease    a. patient reports 100% stenosis of LAD and RCA by cath in 1999. b. 11/2015 Cath: LM nl, LAD 100 - fills by L->L collats from D1, LCX 60d, OM1 lalrge/nl, OM2 small/nl, RCA 126m L->R collats.EF 35-45% (55-60% by echo).   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  Goal of Therapy:  Heparin level 0.3-0.7 units/ml aPTT 66-102 seconds Monitor  platelets by anticoagulation protocol: Yes   Plan:  Start heparin infusion at 1200 units/hr (was recently at low-therapeutic goal with this rate). Monitor heparin levels, aPTT (while apixaban affects anti-Xa assay), and CBC.  VWynona Neat PharmD, BCPS  07/12/2022,3:43 AM

## 2022-07-12 NOTE — H&P (Signed)
History and Physical    Jose Beck [id number] DOB: July 17, 1945 DOA: 07/11/2022  PCP: Pcp, No  Patient coming from: home  I have personally briefly reviewed patient's old medical records in [place]  Chief Complaint: chills , shaking , n/v  HPI: Jose Beck is a 77 y.o. male with medical history significant of CAD s/p known occlusion of LAD and RCA by cath in 1992 with cath in 2017 showing similar findings with collateral flow noted, chronic combined systolic and diastolic CHF, HTN, HLD, Type 2 DM, GERD, AAA s/p repair complicated by endo leak followed by CT surgery and vascular and prior CVA as well as Afib on Eliquis. Patient presents to ED BIB EMS with complaints of shaking chills. Patient also noted to have n/v in the field and per wife has been ill x 1 week with  cough/ uri symptoms,intermittent n/v and fatigue. Per wife patient seen by pcp and given supportive treatment of uri symptoms. Patient was also tested for flu and COVID which was noted to be negative.  Patient over the week had not improved and on day of presentation started having shaking chills. Due to this EMS was called. Patient currently notes no chest pain , no current n/v/ no abdominal pain , still had intermittent cough that is not productive.   ED Course:  Wbc 11.1, hgb 10.5 (12.8.1/24),plt 120 mcv 97.2 Na 141, K 3.7, glu 173, cr 1.8 (base 1.1 , ( 1.49, 1.3, 1.8 trend since 12/23) Lipase 43 Lactic 1.8   CT/ab pelvis  IMPRESSION: 1. Moderate hydronephrosis bilaterally with no obstructing stone. The urinary bladder is markedly distended in the prostate gland is enlarged, suggesting bladder outlet obstruction. Perinephric fat stranding is noted bilaterally and clinical correlation is recommended to exclude superimposed infection. 2. Morphologic changes of cirrhosis and portal hypertension. 3. Multiple enlarged lymph nodes in the retroperitoneum and left pelvic side wall, which may be  infectious, inflammatory, or neoplastic. 4. Expansile lucency with cortical breakthrough in the inferior pubic ramus on the right. The possibility of metastatic disease can not be excluded. MRI is suggested for further evaluation. 5. Aortic atherosclerosis with aneurysmal dilatation of the infrarenal abdominal aorta measuring up to 6.3 cm with endograft in place, not significantly changed from the prior exam. 6. Multi-vessel coronary artery calcifications. 7. Remaining incidental findings as described above. Review of Systems: As per HPI otherwise 10 point review of systems negative.   Past Medical History:  Diagnosis Date   Abdominal aortic aneurysm (AAA) ([place])    Aortic stenosis    a. 11/2015 Echo: EF 55-60%, Gr1 DD, mild AS.   Chronic combined systolic and diastolic CHF (congestive heart failure) ([place])    a. 07/2015: TEE showing EF of 40% b. 11/2015: Echo w/ EF 55-60%, Grade 1 DD, mild AS.   Coronary artery disease    a. patient reports 100% stenosis of LAD and RCA by cath in 1999. b. 11/2015 Cath: LM nl, LAD 100 - fills by L->L collats from D1, LCX 60d, OM1 lalrge/nl, OM2 small/nl, RCA 129m L->R collats.EF 35-45% (55-60% by echo).   Diabetes mellitus without complication ([place] [place]    Type II   GERD (gastroesophageal reflux disease)    Hyperlipemia    Hypertension    Hypertensive heart disease    Stroke ([facility]    a. 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   [place]rombocytopenia [place] 11/2015    Past Surgical History:  Procedure Laterality Date   ABDOMINAL AORTIC ENDOVASCULAR STENT GRAFT  N/A 11/25/2017   Procedure: ABDOMINAL AORTIC ENDOVASCULAR STENT GRAFT;  Surgeon: Serafina Mitchell, MD;  Location: [facility] OR;  Service: Vascular;  Laterality: N/A;   CARDIAC CATHETERIZATION     CARDIAC CATHETERIZATION N/A 11/16/2015   Procedure: Left Heart Cath and Coronary Angiography;  Surgeon: [name], MD;  Location: [facility];  Service: Cardiovascular;  Laterality: N/A;   MOLE REMOVAL      TONSILLECTOMY       reports that he has never smoked. He has never used smokeless tobacco. He reports current alcohol use of about 1.0 standard drink of alcohol per week. He reports that he does not use drugs.  Allergies  Allergen Reactions   Cheese Nausea And Vomiting   Pravastatin Anxiety    Anxiety attack    Family History  Problem Relation Age of Onset   Hypertension Mother    Heart disease Mother     Prior to Admission medications   Medication Sig Start Date End Date Taking? Authorizing Provider  acetaminophen (TYLENOL) 650 MG CR tablet Take 650 mg by mouth in the morning, at noon, and at bedtime.    [provider]  apixaban (ELIQUIS) 5 MG TABS tablet TAKE 1 TABLET BY MOUTH TWICE A DAY 06/10/22   Martinique, Peter M, MD  atorvastatin (LIPITOR) 80 MG tablet Take 1 tablet (80 mg total) by mouth daily at 6 PM. 04/25/16   [place], Peter M, MD  calcium-vitamin D 250-100 MG-UNIT tablet Take 1 tablet by mouth daily. Takes in morning '600mg'$     [provider]  insulin detemir (LEVEMIR) 100 UNIT/ML injection Inject 10 Units into the skin daily.    [provider]  metoprolol succinate (TOPROL-XL) 25 MG 24 hr tablet Take 1 tablet (25 mg total) by mouth daily. PLEASE KEEP SCHEDULED APPOINTMENT WITH CARDIOLOGIST 06/20/22   [place], Peter M, MD  nitroGLYCERIN (NITROSTAT) 0.4 MG SL tablet Place 0.4 mg under the tongue every 5 (five) minutes as needed for chest pain.    [provider]  tacrolimus (PROTOPIC) 0.1 % ointment Apply 1 application topically daily as needed (facial scaling).    [provider]    Physical Exam: Vitals:   07/11/22 2300 07/11/22 2315 07/11/22 2345 07/12/22 0000  BP: 113/70 109/70 116/69 117/67  Pulse: 79 79 78 76  Resp: (!) 22 15 (!) 22 19  Temp:      TempSrc:      SpO2: 98% 95% 98% 98%    Constitutional: NAD, calm, comfortable Vitals:   07/11/22 2300 07/11/22 2315 07/11/22 2345 07/12/22 0000  BP: 113/70 109/70 116/69  117/67  Pulse: 79 79 78 76  Resp: (!) 22 15 (!) 22 19  Temp:      TempSrc:      SpO2: 98% 95% 98% 98%   Eyes: PERRL, lids and conjunctivae normal ENMT: Mucous membranes are moist. Posterior pharynx clear of any exudate or lesions.Normal dentition.  Neck: normal, supple, no masses, no thyromegaly Respiratory: clear to auscultation bilaterally, no wheezing, no crackles. Normal respiratory effort. No accessory muscle use.  Cardiovascular: Regular rate and rhythm, no murmurs / rubs / gallops. No extremity edema. 2+ pedal pulses. Abdomen: no tenderness, no masses palpated. No hepatosplenomegaly. Bowel sounds positive.  Musculoskeletal: no clubbing / cyanosis. No joint deformity upper and lower extremities.decrease rom of right shoulder due to chronic dislocation, no contractures. Normal muscle tone.  Skin: no rashes, lesions, ulcers. No induration Neurologic: CN 2-12 grossly intact. Sensation intact, Strength 5/5 in  all 4.  Psychiatric: Normal judgment and insight. Alert and oriented . Normal mood.    Labs on Admission: I have personally reviewed following labs and imaging studies  CBC: Recent Labs  Lab 07/11/22 1940  WBC 11.1*  HGB 10.5*  HCT 31.5*  MCV 97.2  PLT 123456*   Basic Metabolic Panel: Recent Labs  Lab 07/11/22 1940  NA 141  K 3.7  CL 103  CO2 22  GLUCOSE 173*  BUN 38*  CREATININE 1.80*  CALCIUM 8.9   GFR: CrCl cannot be calculated (Unknown ideal weight.). Liver Function Tests: Recent Labs  Lab 07/11/22 1940  AST 23  ALT 28  ALKPHOS 61  BILITOT 0.8  PROT 7.1  ALBUMIN 3.1*   Recent Labs  Lab 07/11/22 1940  LIPASE 43   No results for input(s): "AMMONIA" in the last 168 hours. Coagulation Profile: No results for input(s): "INR", "PROTIME" in the last 168 hours. Cardiac Enzymes: No results for input(s): "CKTOTAL", "CKMB", "CKMBINDEX", "TROPONINI" in the last 168 hours. BNP (last 3 results) No results for input(s): "PROBNP" in the last 8760  hours. HbA1C: No results for input(s): "HGBA1C" in the last 72 hours. CBG: No results for input(s): "GLUCAP" in the last 168 hours. Lipid Profile: No results for input(s): "CHOL", "HDL", "LDLCALC", "TRIG", "CHOLHDL", "LDLDIRECT" in the last 72 hours. Thyroid Function Tests: No results for input(s): "TSH", "T4TOTAL", "FREET4", "T3FREE", "THYROIDAB" in the last 72 hours. Anemia Panel: No results for input(s): "VITAMINB12", "FOLATE", "FERRITIN", "TIBC", "IRON", "RETICCTPCT" in the last 72 hours. Urine analysis:    Component Value Date/Time   COLORURINE YELLOW 07/11/2022 2140   APPEARANCEUR HAZY (A) 07/11/2022 2140   LABSPEC 1.011 07/11/2022 2140   PHURINE 5.0 07/11/2022 2140   GLUCOSEU NEGATIVE 07/11/2022 2140   HGBUR MODERATE (A) 07/11/2022 2140   BILIRUBINUR NEGATIVE 07/11/2022 2140   KETONESUR NEGATIVE 07/11/2022 2140   PROTEINUR NEGATIVE 07/11/2022 2140   NITRITE POSITIVE (A) 07/11/2022 2140   LEUKOCYTESUR LARGE (A) 07/11/2022 2140    Radiological Exams on Admission: CT CHEST ABDOMEN PELVIS WO CONTRAST  Result Date: 07/12/2022 CLINICAL DATA:  Sepsis.  Chills and shaking. EXAM: CT CHEST, ABDOMEN AND PELVIS WITHOUT CONTRAST TECHNIQUE: Multidetector CT imaging of the chest, abdomen and pelvis was performed following the standard protocol without IV contrast. RADIATION DOSE REDUCTION: This exam was performed according to the departmental dose-optimization program which includes automated exposure control, adjustment of the mA and/or kV according to patient size and/or use of iterative reconstruction technique. COMPARISON:  01/20/2022. FINDINGS: CT CHEST FINDINGS Cardiovascular: The heart is normal in size and there is no pericardial effusion. Three-vessel coronary artery calcifications are noted. There is atherosclerotic calcification of the aorta without evidence of aneurysm. The pulmonary trunk is normal in caliber. Mediastinum/Nodes: No mediastinal or axillary lymphadenopathy. Evaluation  of the hila is limited due to lack of IV contrast. The thyroid gland, trachea, and esophagus are within normal limits. There is a small to moderate hiatal hernia. Lungs/Pleura: Atelectasis is noted bilaterally. No effusion or pneumothorax. Musculoskeletal: There is bony deformity of the humeral head on the right, compatible with old healed fracture. Degenerative changes are present in the thoracic spine. Stable compression deformity is present at T12. No acute or suspicious osseous abnormality. CT ABDOMEN PELVIS FINDINGS Hepatobiliary: No focal abnormality. The liver has a nodular contour, compatible with underlying cirrhosis. No biliary ductal dilatation. The gallbladder is without stones. Pancreas: Unremarkable. No pancreatic ductal dilatation or surrounding inflammatory changes. Spleen: The spleen is enlarged measuring 15 cm.  Adrenals/Urinary Tract: No adrenal nodule or mass. Vascular calcifications are noted at the renal hila bilaterally. No renal calculi. Perinephric fat stranding is noted bilaterally, greater on the left than on the right. There is moderate hydroureteronephrosis bilaterally with no evidence of obstructing stone. The bladder is markedly distended. Stomach/Bowel: Small to moderate hiatal hernia. Stomach is within normal limits. Appendix is not seen. No evidence of bowel wall thickening, distention, or inflammatory changes. No free air or pneumatosis. Vascular/Lymphatic: Aortic atherosclerosis. There is aneurysmal dilatation of the infrarenal abdominal aorta with endograft in place. The aneurysm sac measures 4.8 x 6.3 cm, not significantly changed from the prior exam. There has an enlarged lymph node in the retroperitoneum measuring 1.5 cm, increased from 9 mm on the prior exam. An enlarged lymph node is noted in the along the pelvic wall on the left measuring 1.6 cm. Reproductive: The prostate gland is enlarged. Other: No abdominopelvic ascites. Musculoskeletal: No acute fracture. Degenerative  changes are present in the lumbar spine. There is a lucency with cortical breakthrough in the inferior pubic ramus on the right measuring 1.8 cm. Stable hemangioma at L1. IMPRESSION: 1. Moderate hydronephrosis bilaterally with no obstructing stone. The urinary bladder is markedly distended in the prostate gland is enlarged, suggesting bladder outlet obstruction. Perinephric fat stranding is noted bilaterally and clinical correlation is recommended to exclude superimposed infection. 2. Morphologic changes of cirrhosis and portal hypertension. 3. Multiple enlarged lymph nodes in the retroperitoneum and left pelvic side wall, which may be infectious, inflammatory, or neoplastic. 4. Expansile lucency with cortical breakthrough in the inferior pubic ramus on the right. The possibility of metastatic disease can not be excluded. MRI is suggested for further evaluation. 5. Aortic atherosclerosis with aneurysmal dilatation of the infrarenal abdominal aorta measuring up to 6.3 cm with endograft in place, not significantly changed from the prior exam. 6. Multi-vessel coronary artery calcifications. 7. Remaining incidental findings as described above. Electronically Signed   By: Brett Fairy M.D.   On: 07/12/2022 00:03   DG Chest Port 1 View  Result Date: 07/11/2022 CLINICAL DATA:  Cough and chills. EXAM: PORTABLE CHEST 1 VIEW COMPARISON:  04/29/2022. FINDINGS: Heart is enlarged and the mediastinal contour is within normal limits. There is atherosclerotic calcification of the aorta. Lung volumes are low. No consolidation, effusion, or pneumothorax. There is chronic inferior subluxation and bony deformity of the left humeral head. No acute osseous abnormality. IMPRESSION: 1. No active disease. 2. Cardiomegaly. Electronically Signed   By: Brett Fairy M.D.   On: 07/11/2022 20:31    EKG: Independently reviewed.  Assessment/Plan   Sepsis due to UTI -admit to progressive care  - continue on ctx  -f/u on culture data   - f/u on inflammatory markers  -ivfs   Acute Urinary retention with BOO and B/L hydronephrosis - in setting of BPH  - place foley - will need urology consult in am  -continue flomax as patient tolerates   AKI  -multifactorial due to BOO as well as infection  - s/p foley placement  -monitor labs -voiding trial as appropriate  NSTEMI type II r/o Type 1 -elevated CE 5000's prior 13  - no chest pain , but has had n/v -resume metoprolol  -per cardiology Dr Shirlee Latch believe demand related to sepsis  -will continue to trend if trends up will start  heparin drip    Afib -on Eliquis  - currently rate controlled   Chronic /diastolic/systolic heart failure -appears compensated  -resume home   Abdominal Aortic  Aneurysm  -s/p repair complicated by endoleak CT noted GM 6.2 stable from prior  -patient followed but CT surgery and vascular   DMII, insulin dependent -last A1c  -resume lantus 10 unts sq -iss/fs   Metastatic prostate CA  --Not felt to be a candidate for hormone therapy due to history of stroke and currently therapy options on hold   Hypertension  -stable  continue metoprolol   Chronic left shoulder pain  -supportive care  -due to chronic dislocation   DVT prophylaxis: on full dose AC Code Status: full/ as discussed per patient wishes in event of cardiac arrest  Family Communication: wife at bedside  Stansbery,[name] [phone] [facility])   Disposition Plan: patient  expected to be admitted greater than 2 midnights  Consults called: Cardiology , [place]  Admission status: progressive care   Clance Boll MD [facility]   If 7PM-7AM, please contact night-coverage [email or web address] Password [email or web address]  07/12/2022, 1:03 AM

## 2022-07-12 NOTE — ED Notes (Signed)
ED TO INPATIENT HANDOFF REPORT  ED Nurse Name and Phone #: Lysle Rubens RN [phone]  S Name/Age/Gender Jose Beck 77 y.o. male Room/Bed: 010C/010C  Code Status   Code Status: Full Code  Home/SNF/Other Home Patient oriented to: self, place, time, and situation Is this baseline? Yes   Triage Complete: Triage complete  Chief Complaint Sepsis secondary to UTI ([place]) [A41.9, N39.0]  Triage Note Patient BIB [facility] from home. Complaint of chills with shaking, was seen by PCP 1 week ago.  EMS report patient vomited 1 time with them; wife reports patient has vomited couple times over past week.    Allergies Allergies  Allergen Reactions   Cheese Nausea And Vomiting   Pravastatin Anxiety    Anxiety attack    Level of Care/Admitting Diagnosis ED Disposition     ED Disposition  Admit   Condition  --   [place]: [place] [100100]  Level of Care: Progressive [102]  Admit to Progressive based on following criteria: CARDIOVASCULAR & THORACIC of moderate stability with acute coronary syndrome symptoms/low risk myocardial infarction/hypertensive urgency/arrhythmias/heart failure potentially compromising stability and stable post cardiovascular intervention patients.  Admit to Progressive based on following criteria: MULTISYSTEM THREATS such as stable sepsis, metabolic/electrolyte imbalance with or without encephalopathy that is responding to early treatment.  May admit patient to [name] or [facility] if equivalent level of care is available:: Yes  Covid Evaluation: Confirmed COVID Negative  Diagnosis: Sepsis secondary to UTI [facility]) [id number]  Admitting Physician: Clance Boll [id number]  Attending Physician: Clance Boll [id number]  Certification:: I certify this patient will need inpatient services for at least 2 midnights  Estimated Length of Stay: 3          B Medical/Surgery History Past Medical History:  Diagnosis Date    Abdominal aortic aneurysm (AAA) ([place])    Aortic stenosis    a. 11/2015 Echo: EF 55-60%, Gr1 DD, mild AS.   Chronic combined systolic and diastolic CHF (congestive heart failure) ([place])    a. 07/2015: TEE showing EF of 40% b. 11/2015: Echo w/ EF 55-60%, Grade 1 DD, mild AS.   Coronary artery disease    a. patient reports 100% stenosis of LAD and RCA by cath in 1999. b. 11/2015 Cath: LM nl, LAD 100 - fills by L->L collats from D1, LCX 60d, OM1 lalrge/nl, OM2 small/nl, RCA 150m L->R collats.EF 35-45% (55-60% by echo).   Diabetes mellitus without complication ([place]    Type II   GERD (gastroesophageal reflux disease)    Hyperlipemia    Hypertension    Hypertensive heart disease    Stroke ([place]    a. 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   Thrombocytopenia ([place] 11/2015   Past Surgical History:  Procedure Laterality Date   ABDOMINAL AORTIC ENDOVASCULAR STENT GRAFT N/A 11/25/2017   Procedure: ABDOMINAL AORTIC ENDOVASCULAR STENT GRAFT;  Surgeon: BSerafina Mitchell MD;  Location: [facility] OR;  Service: Vascular;  Laterality: N/A;   CARDIAC CATHETERIZATION     CARDIAC CATHETERIZATION N/A 11/16/2015   Procedure: Left Heart Cath and Coronary Angiography;  Surgeon: HBelva Crome MD;  [facility];  Service: Cardiovascular;  Laterality: N/A;   MOLE REMOVAL     TONSILLECTOMY       A IV Location/Drains/Wounds Patient Lines/Drains/Airways Status     Active Line/Drains/Airways     Name Placement date Placement time Site Days   Peripheral IV 07/11/22 18 G Left Antecubital 07/11/22  1934  Antecubital  1   Wound / Incision (Open or Dehisced) 12/20/21 Skin tear Arm Distal;Lower;Posterior;Right skin tear at wrist and on knuckles of the right hand 12/20/21  0800  Arm  204            Intake/Output Last 24 hours  Intake/Output Summary (Last 24 hours) at 07/12/2022 0334 Last data filed at 07/11/2022 2303 Gross per 24 hour  Intake 100 ml  Output --  Net 100 ml     Labs/Imaging Results for orders placed or performed during the hospital encounter of 07/11/22 (from the past 48 hour(s))  Lipase, blood     Status: None   Collection Time: 07/11/22  7:40 PM  Result Value Ref Range   Lipase 43 11 - 51 U/L    Comment: Performed at [facility], [address], [place], [place] [address]  Comprehensive metabolic panel     Status: Abnormal   Collection Time: 07/11/22  7:40 PM  Result Value Ref Range   Sodium 141 135 - 145 mmol/L   Potassium 3.7 3.5 - 5.1 mmol/L   Chloride 103 98 - 111 mmol/L   CO2 22 22 - 32 mmol/L   Glucose, Bld 173 (H) 70 - 99 mg/dL    Comment: Glucose reference range applies only to samples taken after fasting for at least 8 hours.   BUN 38 (H) 8 - 23 mg/dL   Creatinine, Ser 1.80 (H) 0.61 - 1.24 mg/dL   Calcium 8.9 8.9 - 10.3 mg/dL   Total Protein 7.1 6.5 - 8.1 g/dL   Albumin 3.1 (L) 3.5 - 5.0 g/dL   AST 23 15 - 41 U/L   ALT 28 0 - 44 U/L   Alkaline Phosphatase 61 38 - 126 U/L   Total Bilirubin 0.8 0.3 - 1.2 mg/dL   GFR, Estimated 39 (L) >60 mL/min    Comment: (NOTE) Calculated using the CKD-EPI Creatinine Equation (2021)    Anion gap 16 (H) 5 - 15    Comment: Performed at [facility], [place] [address] [place], [place] [address]  CBC     Status: Abnormal   Collection Time: 07/11/22  7:40 PM  Result Value Ref Range   WBC 11.1 (H) 4.0 - 10.5 K/uL   RBC 3.24 (L) 4.22 - 5.81 MIL/uL   Hemoglobin 10.5 (L) 13.0 - 17.0 g/dL   HCT 31.5 (L) 39.0 - 52.0 %   MCV 97.2 80.0 - 100.0 fL   MCH 32.4 26.0 - 34.0 pg   MCHC 33.3 30.0 - 36.0 g/dL   RDW 13.9 11.5 - 15.5 %   Platelets 120 (L) 150 - 400 K/uL   nRBC 0.0 0.0 - 0.2 %    Comment: Performed at [facility], [place] [address] [place], [place] [address]  Lactic acid, plasma     Status: None   Collection Time: 07/11/22  7:40 PM  Result Value Ref Range   Lactic Acid, Venous 1.8 0.5 - 1.9 mmol/L    Comment: Performed at [facility] [address]  [place], [place] [address]  Resp panel by RT-PCR (RSV, Flu A&B, Covid) Anterior Nasal Swab     Status: None   Collection Time: 07/11/22  8:02 PM   Specimen: Anterior Nasal Swab  Result Value Ref Range   SARS Coronavirus 2 by RT PCR NEGATIVE NEGATIVE   Influenza A by PCR NEGATIVE NEGATIVE   Influenza B by PCR NEGATIVE NEGATIVE  Comment: (NOTE) The Xpert Xpress SARS-CoV-2/FLU/RSV plus assay is intended as an aid in the diagnosis of influenza from Nasopharyngeal swab specimens and should not be used as a sole basis for treatment. Nasal washings and aspirates are unacceptable for Xpert Xpress SARS-CoV-2/FLU/RSV testing.  Fact Sheet for Patients: EntrepreneurPulse.com.au  Fact Sheet for Healthcare Providers: IncredibleEmployment.be  This test is not yet approved or cleared by the [place] FDA and has been authorized for detection and/or diagnosis of SARS-CoV-2 by FDA under an Emergency Use Authorization (EUA). This EUA will remain in effect (meaning this test can be used) for the duration of the COVID-19 declaration under Section 564(b)(1) of the Act, 21 U.S.C. section 360bbb-3(b)(1), unless the authorization is terminated or revoked.     Resp Syncytial Virus by PCR NEGATIVE NEGATIVE    Comment: (NOTE) Fact Sheet for Patients: EntrepreneurPulse.com.au  Fact Sheet for Healthcare Providers: IncredibleEmployment.be  This test is not yet approved or cleared by the [place] FDA and has been authorized for detection and/or diagnosis of SARS-CoV-2 by FDA under an Emergency Use Authorization (EUA). This EUA will remain in effect (meaning this test can be used) for the duration of the COVID-19 declaration under Section 564(b)(1) of the Act, 21 U.S.C. section 360bbb-3(b)(1), unless the authorization is terminated or revoked.  Performed at [facility], [place] [address] [place], [place] [address]    Urinalysis, Routine w reflex microscopic -Urine, Clean Catch     Status: Abnormal   Collection Time: 07/11/22  9:40 PM  Result Value Ref Range   Color, Urine YELLOW YELLOW   APPearance HAZY (A) CLEAR   Specific Gravity, Urine 1.011 1.005 - 1.030   pH 5.0 5.0 - 8.0   Glucose, UA NEGATIVE NEGATIVE mg/dL   Hgb urine dipstick MODERATE (A) NEGATIVE   Bilirubin Urine NEGATIVE NEGATIVE   Ketones, ur NEGATIVE NEGATIVE mg/dL   Protein, ur NEGATIVE NEGATIVE mg/dL   Nitrite POSITIVE (A) NEGATIVE   Leukocytes,Ua LARGE (A) NEGATIVE   RBC / HPF 6-10 0 - 5 RBC/hpf   WBC, UA >50 0 - 5 WBC/hpf   Bacteria, UA MANY (A) NONE SEEN   Squamous Epithelial / HPF 0-5 0 - 5 /HPF   Mucus PRESENT     Comment: Performed at [facility], [address] [place], [place] [address]  Troponin I (High Sensitivity)     Status: Abnormal   Collection Time: 07/11/22 10:37 PM  Result Value Ref Range   Troponin I (High Sensitivity) 5,092 (HH) <18 ng/L    Comment: CRITICAL RESULT CALLED TO, READ BACK BY AND VERIFIED WITH Oswaldo Cueto RN 07/11/22 2327 M KOROLESKI (NOTE) Elevated high sensitivity troponin I (hsTnI) values and significant  changes across serial measurements may suggest ACS but many other  chronic and acute conditions are known to elevate hsTnI results.  Refer to the "Links" section for chest pain algorithms and additional  guidance. Performed at [facility], [place] [address] [place], [place] [address]    CT CHEST ABDOMEN PELVIS WO CONTRAST  Result Date: 07/12/2022 CLINICAL DATA:  Sepsis.  Chills and shaking. EXAM: CT CHEST, ABDOMEN AND PELVIS WITHOUT CONTRAST TECHNIQUE: Multidetector CT imaging of the chest, abdomen and pelvis was performed following the standard protocol without IV contrast. RADIATION DOSE REDUCTION: This exam was performed according to the departmental dose-optimization program which includes automated exposure control, adjustment of the mA and/or kV according to patient  size and/or use of iterative reconstruction technique. COMPARISON:  01/20/2022. FINDINGS:  CT CHEST FINDINGS Cardiovascular: The heart is normal in size and there is no pericardial effusion. Three-vessel coronary artery calcifications are noted. There is atherosclerotic calcification of the aorta without evidence of aneurysm. The pulmonary trunk is normal in caliber. Mediastinum/Nodes: No mediastinal or axillary lymphadenopathy. Evaluation of the hila is limited due to lack of IV contrast. The thyroid gland, trachea, and esophagus are within normal limits. There is a small to moderate hiatal hernia. Lungs/Pleura: Atelectasis is noted bilaterally. No effusion or pneumothorax. Musculoskeletal: There is bony deformity of the humeral head on the right, compatible with old healed fracture. Degenerative changes are present in the thoracic spine. Stable compression deformity is present at T12. No acute or suspicious osseous abnormality. CT ABDOMEN PELVIS FINDINGS Hepatobiliary: No focal abnormality. The liver has a nodular contour, compatible with underlying cirrhosis. No biliary ductal dilatation. The gallbladder is without stones. Pancreas: Unremarkable. No pancreatic ductal dilatation or surrounding inflammatory changes. Spleen: The spleen is enlarged measuring 15 cm. Adrenals/Urinary Tract: No adrenal nodule or mass. Vascular calcifications are noted at the renal hila bilaterally. No renal calculi. Perinephric fat stranding is noted bilaterally, greater on the left than on the right. There is moderate hydroureteronephrosis bilaterally with no evidence of obstructing stone. The bladder is markedly distended. Stomach/Bowel: Small to moderate hiatal hernia. Stomach is within normal limits. Appendix is not seen. No evidence of bowel wall thickening, distention, or inflammatory changes. No free air or pneumatosis. Vascular/Lymphatic: Aortic atherosclerosis. 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 The aneurysm sac measures 4.8 x 6.3 cm, not significantly changed from the prior exam. There has an enlarged lymph node in the retroperitoneum measuring 1.5 cm, increased from 9 mm on the prior exam. An enlarged lymph node is noted in the along the pelvic wall on the left measuring 1.6 cm. Reproductive: The prostate gland is enlarged. Other: No abdominopelvic ascites. Musculoskeletal: No acute fracture. Degenerative changes are present in the lumbar spine. There is a lucency with cortical breakthrough in the inferior pubic ramus on the right measuring 1.8 cm. Stable hemangioma at L1. IMPRESSION: 1. Moderate hydronephrosis bilaterally with no obstructing stone. The urinary bladder is markedly distended in the prostate gland is enlarged, suggesting bladder outlet obstruction. Perinephric fat stranding is noted bilaterally and clinical correlation is recommended to exclude superimposed infection. 2. Morphologic changes of cirrhosis and portal hypertension. 3. Multiple enlarged lymph nodes in the retroperitoneum and left pelvic side wall, which may be infectious, inflammatory, or neoplastic. 4. Expansile lucency with cortical breakthrough in the inferior pubic ramus on the right. The possibility of metastatic disease can not be excluded. MRI is suggested for further evaluation. 5. Aortic atherosclerosis with aneurysmal dilatation of the infrarenal abdominal aorta measuring up to 6.3 cm with endograft in place, not significantly changed from the prior exam. 6. Multi-vessel coronary artery calcifications. 7. Remaining incidental findings as described above. Electronically Signed   By: Brett Fairy M.D.   On: 07/12/2022 00:03   DG Chest Port 1 View  Result Date: 07/11/2022 CLINICAL DATA:  Cough and chills. EXAM: PORTABLE CHEST 1 VIEW COMPARISON:  04/29/2022. FINDINGS: Heart is enlarged and the mediastinal contour is within normal limits. There is atherosclerotic calcification of the aorta. Lung  volumes are low. No consolidation, effusion, or pneumothorax. There is chronic inferior subluxation and bony deformity of the left humeral head. No acute osseous abnormality. IMPRESSION: 1. No active disease. 2. Cardiomegaly. Electronically Signed   By: Regan Rakers.D.  On: 07/11/2022 20:31    Pending Labs Unresulted Labs (From admission, onward)     Start     Ordered   07/12/22 0323  Hemoglobin A1c  Once,   R       Comments: To assess prior glycemic control    07/12/22 0323   07/12/22 0304  Urine Culture (for pregnant, neutropenic or urologic patients or patients with an indwelling urinary catheter)  (Urine Culture)  Once,   R       Question:  Indication  Answer:  Dysuria   07/12/22 0304   07/12/22 0304  TSH  Once,   R        07/12/22 0304   07/12/22 0304  Brain natriuretic peptide  Once,   R        07/12/22 0304   07/12/22 0304  Hemoglobin A1c  Once,   R        07/12/22 0304   07/12/22 0303  Comprehensive metabolic panel  Once,   R        07/12/22 0304   07/12/22 0303  Magnesium  Once,   R        07/12/22 0304   07/12/22 0303  CBC with Differential/Platelet  Once,   R        07/12/22 0304   07/11/22 2016  Culture, blood (routine x 2)  BLOOD CULTURE X 2,   R      07/11/22 2015            Vitals/Pain Today's Vitals   07/12/22 0100 07/12/22 0130 07/12/22 0200 07/12/22 0230  BP: 116/70 134/76 133/77 129/77  Pulse: 75 76 84 78  Resp: 19 (!) 21 (!) 22 20  Temp:      TempSrc:      SpO2: 97% 98% 98% 98%  PainSc:        Isolation Precautions No active isolations  Medications Medications  cefTRIAXone (ROCEPHIN) 2 g in sodium chloride 0.9 % 100 mL IVPB (has no administration in time range)  atorvastatin (LIPITOR) tablet 80 mg (has no administration in time range)  insulin detemir (LEVEMIR) injection 10 Units (has no administration in time range)  tamsulosin (FLOMAX) capsule 0.4 mg (has no administration in time range)  acetaminophen (TYLENOL) tablet 650 mg (has no  administration in time range)    Or  acetaminophen (TYLENOL) suppository 650 mg (has no administration in time range)  ondansetron (ZOFRAN) tablet 4 mg (has no administration in time range)    Or  ondansetron (ZOFRAN) injection 4 mg (has no administration in time range)  albuterol (PROVENTIL) (2.5 MG/3ML) 0.083% nebulizer solution 2.5 mg (has no administration in time range)  insulin aspart (novoLOG) injection 0-6 Units (has no administration in time range)  heparin ADULT infusion 100 units/mL (25000 units/237m) (has no administration in time range)  acetaminophen (TYLENOL) tablet 650 mg (650 mg Oral Given 07/11/22 2047)  lactated ringers bolus 500 mL (0 mLs Intravenous Stopped 07/11/22 2230)  cefTRIAXone (ROCEPHIN) 2 g in sodium chloride 0.9 % 100 mL IVPB (0 g Intravenous Stopped 07/11/22 2303)    Mobility walks with person assist     Focused Assessments Cardiac Assessment Handoff:  Cardiac Rhythm: Sinus tachycardia Lab Results  Component Value Date   TROPONINI <0.03 11/16/2015   No results found for: "DDIMER" Does the Patient currently have chest pain? No   , Neuro Assessment Handoff:  Swallow screen pass?    Cardiac Rhythm: Sinus tachycardia       Neuro Assessment:  Neuro Checks:      Has TPA been given? No If patient is a Neuro Trauma and patient is going to OR before floor call report to [facility] nurse: [phone] or [phone]  , Renal Assessment Handoff:  Hemodialysis Schedule:  Last Hemodialysis date and time:    Restricted appendage:    , Pulmonary Assessment Handoff:  Lung sounds:   O2 Device: Room Air      R Recommendations: See Admitting Provider Note  Report given to:   Additional Notes:

## 2022-07-12 NOTE — Progress Notes (Signed)
PHARMACY - PHYSICIAN COMMUNICATION CRITICAL VALUE ALERT - BLOOD CULTURE IDENTIFICATION (BCID)  Jose Beck is an 77 y.o. male who presented to [facility] on 07/11/2022  Assessment:  43 yom presenting with sepsis due to UTI. Now with 3/4 BCx bottles growing E.coli with no resistance detected.  Name of physician (or Provider) Contacted: Pahwani, R  Current antibiotics: ceftriaxone 2g IV q24h  Changes to prescribed antibiotics recommended:  Patient is on recommended antibiotics - No changes needed  Results for orders placed or performed during the hospital encounter of 07/11/22  Blood Culture ID Panel (Reflexed) (Collected: 07/11/2022  7:52 PM)  Result Value Ref Range   Enterococcus faecalis NOT DETECTED NOT DETECTED   Enterococcus Faecium NOT DETECTED NOT DETECTED   Listeria monocytogenes NOT DETECTED NOT DETECTED   Staphylococcus species NOT DETECTED NOT DETECTED   Staphylococcus aureus (BCID) NOT DETECTED NOT DETECTED   Staphylococcus epidermidis NOT DETECTED NOT DETECTED   Staphylococcus lugdunensis NOT DETECTED NOT DETECTED   Streptococcus species NOT DETECTED NOT DETECTED   Streptococcus agalactiae NOT DETECTED NOT DETECTED   Streptococcus pneumoniae NOT DETECTED NOT DETECTED   Streptococcus pyogenes NOT DETECTED NOT DETECTED   A.calcoaceticus-baumannii NOT DETECTED NOT DETECTED   Bacteroides fragilis NOT DETECTED NOT DETECTED   Enterobacterales DETECTED (A) NOT DETECTED   Enterobacter cloacae complex NOT DETECTED NOT DETECTED   Escherichia coli DETECTED (A) NOT DETECTED   Klebsiella aerogenes NOT DETECTED NOT DETECTED   Klebsiella oxytoca NOT DETECTED NOT DETECTED   Klebsiella pneumoniae NOT DETECTED NOT DETECTED   Proteus species NOT DETECTED NOT DETECTED   Salmonella species NOT DETECTED NOT DETECTED   Serratia marcescens NOT DETECTED NOT DETECTED   Haemophilus influenzae NOT DETECTED NOT DETECTED   Neisseria meningitidis NOT DETECTED NOT DETECTED   Pseudomonas  aeruginosa NOT DETECTED NOT DETECTED   Stenotrophomonas maltophilia NOT DETECTED NOT DETECTED   Candida albicans NOT DETECTED NOT DETECTED   Candida auris NOT DETECTED NOT DETECTED   Candida glabrata NOT DETECTED NOT DETECTED   Candida krusei NOT DETECTED NOT DETECTED   Candida parapsilosis NOT DETECTED NOT DETECTED   Candida tropicalis NOT DETECTED NOT DETECTED   Cryptococcus neoformans/gattii NOT DETECTED NOT DETECTED   CTX-M ESBL NOT DETECTED NOT DETECTED   Carbapenem resistance IMP NOT DETECTED NOT DETECTED   Carbapenem resistance KPC NOT DETECTED NOT DETECTED   Carbapenem resistance NDM NOT DETECTED NOT DETECTED   Carbapenem resist OXA 48 LIKE NOT DETECTED NOT DETECTED   Carbapenem resistance VIM NOT DETECTED NOT DETECTED    [name] 07/12/2022  10:25 AM

## 2022-07-13 ENCOUNTER — Encounter: Payer: Self-pay | Admitting: Surgery

## 2022-07-13 DIAGNOSIS — N390 Urinary tract infection, site not specified: Secondary | ICD-10-CM | POA: Diagnosis not present

## 2022-07-13 DIAGNOSIS — I350 Nonrheumatic aortic (valve) stenosis: Secondary | ICD-10-CM

## 2022-07-13 DIAGNOSIS — A419 Sepsis, unspecified organism: Secondary | ICD-10-CM | POA: Diagnosis not present

## 2022-07-13 DIAGNOSIS — I4729 Other ventricular tachycardia: Secondary | ICD-10-CM

## 2022-07-13 LAB — CBC
HCT: 28.3 % — ABNORMAL LOW (ref 39.0–52.0)
Hemoglobin: 9.2 g/dL — ABNORMAL LOW (ref 13.0–17.0)
MCH: 31.2 pg (ref 26.0–34.0)
MCHC: 32.5 g/dL (ref 30.0–36.0)
MCV: 95.9 fL (ref 80.0–100.0)
Platelets: 98 10*3/uL — ABNORMAL LOW (ref 150–400)
RBC: 2.95 MIL/uL — ABNORMAL LOW (ref 4.22–5.81)
RDW: 14 % (ref 11.5–15.5)
WBC: 8.9 10*3/uL (ref 4.0–10.5)
nRBC: 0 % (ref 0.0–0.2)

## 2022-07-13 LAB — GLUCOSE, CAPILLARY
Glucose-Capillary: 127 mg/dL — ABNORMAL HIGH (ref 70–99)
Glucose-Capillary: 127 mg/dL — ABNORMAL HIGH (ref 70–99)
Glucose-Capillary: 128 mg/dL — ABNORMAL HIGH (ref 70–99)
Glucose-Capillary: 132 mg/dL — ABNORMAL HIGH (ref 70–99)
Glucose-Capillary: 140 mg/dL — ABNORMAL HIGH (ref 70–99)
Glucose-Capillary: 150 mg/dL — ABNORMAL HIGH (ref 70–99)
Glucose-Capillary: 161 mg/dL — ABNORMAL HIGH (ref 70–99)

## 2022-07-13 LAB — COMPREHENSIVE METABOLIC PANEL
ALT: 28 U/L (ref 0–44)
AST: 81 U/L — ABNORMAL HIGH (ref 15–41)
Albumin: 2.5 g/dL — ABNORMAL LOW (ref 3.5–5.0)
Alkaline Phosphatase: 52 U/L (ref 38–126)
Anion gap: 11 (ref 5–15)
BUN: 31 mg/dL — ABNORMAL HIGH (ref 8–23)
CO2: 23 mmol/L (ref 22–32)
Calcium: 8.3 mg/dL — ABNORMAL LOW (ref 8.9–10.3)
Chloride: 104 mmol/L (ref 98–111)
Creatinine, Ser: 1.67 mg/dL — ABNORMAL HIGH (ref 0.61–1.24)
GFR, Estimated: 42 mL/min — ABNORMAL LOW (ref >60)
Glucose, Bld: 144 mg/dL — ABNORMAL HIGH (ref 70–99)
Potassium: 3.4 mmol/L — ABNORMAL LOW (ref 3.5–5.1)
Sodium: 138 mmol/L (ref 135–145)
Total Bilirubin: 0.5 mg/dL (ref 0.3–1.2)
Total Protein: 6.6 g/dL (ref 6.5–8.1)

## 2022-07-13 LAB — APTT
aPTT: 126 seconds — ABNORMAL HIGH (ref 24–36)
aPTT: 85 seconds — ABNORMAL HIGH (ref 24–36)

## 2022-07-13 LAB — MAGNESIUM: Magnesium: 1.8 mg/dL (ref 1.7–2.4)

## 2022-07-13 LAB — HEPARIN LEVEL (UNFRACTIONATED): Heparin Unfractionated: >1.1 IU/mL — ABNORMAL HIGH (ref 0.30–0.70)

## 2022-07-13 MED ORDER — POTASSIUM CHLORIDE 20 MEQ PO PACK
40.0000 meq | PACK | Freq: Two times a day (BID) | ORAL | Status: AC
  Administered 2022-07-13 (×2): 40 meq via ORAL
  Filled 2022-07-13 (×2): qty 2

## 2022-07-13 MED ORDER — HEPARIN (PORCINE) 25000 UT/250ML-% IV SOLN
1200.0000 [IU]/h | INTRAVENOUS | Status: AC
  Administered 2022-07-13 – 2022-07-14 (×3): 1200 [IU]/h via INTRAVENOUS
  Filled 2022-07-13 (×2): qty 250

## 2022-07-13 NOTE — Progress Notes (Signed)
Rounding Note    Patient Name: Jose Beck Date of Encounter: 07/13/2022  [place] Cardiologist: Peter Martinique, MD   Subjective   Feeling better.  No longer having chills.  Denies any chest pain or shortness of breath. Normal sinus rhythm in the 70s.  Normal blood pressure. Improving renal parameters. Cardiac troponin trending down.  Inpatient Medications    Scheduled Meds:  atorvastatin  80 mg Oral q1800   Chlorhexidine Gluconate Cloth  6 each Topical Daily   insulin aspart  0-6 Units Subcutaneous Q4H   insulin detemir  10 Units Subcutaneous Daily   metoprolol succinate  25 mg Oral Daily   potassium chloride  40 mEq Oral BID   tamsulosin  0.4 mg Oral Daily   Continuous Infusions:  cefTRIAXone (ROCEPHIN)  IV Stopped (07/12/22 2138)   heparin 1,200 Units/hr (07/13/22 0413)   PRN Meds: acetaminophen **OR** acetaminophen, albuterol, nitroGLYCERIN, ondansetron **OR** ondansetron (ZOFRAN) IV   Vital Signs    Vitals:   07/13/22 0400 07/13/22 0500 07/13/22 0600 07/13/22 0751  BP: (!) 121/51 127/63 112/63 (!) 121/58  Pulse: 73 72 68 80  Resp: 19 (!) '23 19 15  '$ Temp:    98.9 F (37.2 C)  TempSrc:    Oral  SpO2: 96% 92% 95% 95%  Weight:  89.1 kg    Height:        Intake/Output Summary (Last 24 hours) at 07/13/2022 1118 Last data filed at 07/13/2022 0804 Gross per 24 hour  Intake 449.06 ml  Output 2825 ml  Net -2375.94 ml      07/13/2022    5:00 AM 07/12/2022    4:51 AM 07/12/2022    3:30 AM  Last 3 Weights  Weight (lbs) 196 lb 6.9 oz 204 lb 2.3 oz 206 lb  Weight (kg) 89.1 kg 92.6 kg 93.441 kg      Telemetry    Sinus rhythm- Personally Reviewed  ECG    Initial ECG shows sinus rhythm, old anteroseptal infarction and old inferior infarction with ST segment depression in leads I and aVL, follow-up ECG unfortunately is performed with limb lead reversal, but the ischemic changes have improved.- Personally Reviewed  Physical Exam  Comfortable lying  in bed. GEN: No acute distress.   Neck: No JVD; bilateral carotid bruits radiating from chest, no delay Cardiac: RRR, normal S1, S2 is a little soft but still heard a distinctly, 3/6 mid peaking aortic ejection murmur radiating to carotids, no diastolic murmurs, rubs, or gallops.  Respiratory: Clear to auscultation bilaterally. GI: Soft, nontender, non-distended  MS: No edema; prominent disc location right shoulder Neuro:  Nonfocal  Psych: Normal affect   Labs    High Sensitivity Troponin:   Recent Labs  Lab 07/11/22 2237 07/12/22 0326 07/12/22 0554 07/12/22 0852 07/12/22 1044  TROPONINIHS 5,092* 16,169* 14,490* 12,829* 13,452*     Chemistry Recent Labs  Lab 07/11/22 1940 07/12/22 0326 07/13/22 0028  NA 141 139 138  K 3.7 3.8 3.4*  CL 103 103 104  CO2 '22 23 23  '$ GLUCOSE 173* 165* 144*  BUN 38* 37* 31*  CREATININE 1.80* 1.77* 1.67*  CALCIUM 8.9 8.7* 8.3*  MG  --  1.6* 1.8  PROT 7.1 7.1 6.6  ALBUMIN 3.1* 2.9* 2.5*  AST 23 73* 81*  ALT '28 29 28  '$ ALKPHOS 61 55 52  BILITOT 0.8 0.6 0.5  GFRNONAA 39* 39* 42*  ANIONGAP 16* 13 11    Lipids No results for input(s): "CHOL", "TRIG", "HDL", "  LABVLDL", "LDLCALC", "CHOLHDL" in the last 168 hours.  Hematology Recent Labs  Lab 07/12/22 0326 07/12/22 0554 07/13/22 0027  WBC 10.1 10.1 8.9  RBC 3.08* 3.04* 2.95*  HGB 9.9* 9.7* 9.2*  HCT 30.2* 28.9* 28.3*  MCV 98.1 95.1 95.9  MCH 32.1 31.9 31.2  MCHC 32.8 33.6 32.5  RDW 14.2 14.2 14.0  PLT 109* 106* 98*   Thyroid  Recent Labs  Lab 07/12/22 0326  TSH 0.673    BNP Recent Labs  Lab 07/12/22 0326  BNP 607.7*    DDimer No results for input(s): "DDIMER" in the last 168 hours.   Radiology    ECHOCARDIOGRAM COMPLETE  Result Date: 07/12/2022    ECHOCARDIOGRAM REPORT   Patient Name:   Jose Beck Date of Exam: 07/12/2022 Medical Rec #:  [id number]           Height:       68.0 in Accession #:    [id number]          Weight:       204.1 lb Date of Birth:  1946/01/14           BSA:          2.062 m Patient Age:    77 years            BP:           129/72 mmHg Patient Gender: M                   HR:           90 bpm. Exam Location:  Inpatient Procedure: 2D Echo, Cardiac Doppler, Color Doppler and Intracardiac            Opacification Agent Indications:    NSTEMI I21.4  History:        Patient has prior history of Echocardiogram examinations, most                 recent 04/28/2022. CHF, CAD; Arrythmias:Atrial Fibrillation.  Sonographer:    Luane School RDCS Referring Phys: [id number] [facility] A TH[facility]  Sonographer Comments: Suboptimal apical window and suboptimal subcostal window. IMPRESSIONS  1. Left ventricular ejection fraction, by estimation, is 30 to 35%. The left ventricle has moderately decreased function. The left ventricle demonstrates regional wall motion abnormalities (see scoring diagram/findings for description). There is mild left ventricular hypertrophy. Left ventricular diastolic parameters are consistent with Grade II diastolic dysfunction (pseudonormalization). Elevated left atrial pressure.  2. Right ventricular systolic function is normal. The right ventricular size is mildly enlarged. There is normal pulmonary artery systolic pressure. The estimated right ventricular systolic pressure is [id number] mmHg.  3. Left atrial size was mildly dilated.  4. The mitral valve is normal in structure. Mild mitral valve regurgitation.  5. The inferior vena cava is normal in size with greater than 50% respiratory variability, suggesting right atrial pressure of 3 mmHg.  6. The aortic valve is calcified. Aortic valve regurgitation is not visualized. Severe aortic valve stenosis. Moderate AS by gradients (MG 25 mmHg, Vmax 3.2 m/s), severe by AVA (0.9cm^2) and DI (0.25). Low SV index (26 cc/m^2), suspect low flow low gradient severe AS FINDINGS  Left Ventricle: Left ventricular ejection fraction, by estimation, is 30 to 35%. The left ventricle has moderately decreased function. The left  ventricle demonstrates regional wall motion abnormalities. Definity contrast agent was given IV to delineate the left ventricular endocardial borders. The left ventricular internal cavity size was normal in size. There  is mild left ventricular hypertrophy. Left ventricular diastolic parameters are consistent with Grade II diastolic dysfunction (pseudonormalization). Elevated left atrial pressure.  LV Wall Scoring: The mid and distal anterior septum, mid and distal inferior wall, mid inferoseptal segment, apical anterior segment, and apex are akinetic. The anterior wall, entire lateral wall, basal anteroseptal segment, basal inferior segment, and basal inferoseptal segment are normal. Right Ventricle: The right ventricular size is mildly enlarged. No increase in right ventricular wall thickness. Right ventricular systolic function is normal. There is normal pulmonary artery systolic pressure. The tricuspid regurgitant velocity is 2.72  m/s, and with an assumed right atrial pressure of 3 mmHg, the estimated right ventricular systolic pressure is [id number] mmHg. Left Atrium: Left atrial size was mildly dilated. Right Atrium: Right atrial size was normal in size. Pericardium: There is no evidence of pericardial effusion. Mitral Valve: The mitral valve is normal in structure. Mild mitral valve regurgitation. Tricuspid Valve: The tricuspid valve is normal in structure. Tricuspid valve regurgitation is trivial. Aortic Valve: The aortic valve is calcified. Aortic valve regurgitation is not visualized. Severe aortic stenosis is present. Aortic valve mean gradient measures 25.0 mmHg. Aortic valve peak gradient measures 42.0 mmHg. Aortic valve area, by VTI measures  0.87 cm. Pulmonic Valve: The pulmonic valve was not well visualized. Pulmonic valve regurgitation is trivial. Aorta: The aortic root and ascending aorta are structurally normal, with no evidence of dilitation. Venous: The inferior vena cava is normal in size with  greater than 50% respiratory variability, suggesting right atrial pressure of 3 mmHg. IAS/Shunts: The interatrial septum was not well visualized.  LEFT VENTRICLE PLAX 2D LVIDd:         5.50 cm      Diastology LVIDs:         4.00 cm      LV e' medial:    5.98 cm/s LV PW:         1.40 cm      LV E/e' medial:  20.9 LV IVS:        1.40 cm      LV e' lateral:   8.81 cm/s LVOT diam:     2.10 cm      LV E/e' lateral: 14.2 LV SV:         54 LV SV Index:   26 LVOT Area:     3.46 cm  LV Volumes (MOD) LV vol d, MOD A2C: 129.0 ml LV vol d, MOD A4C: 116.0 ml LV vol s, MOD A2C: 81.8 ml LV vol s, MOD A4C: 80.9 ml LV SV MOD A2C:     47.2 ml LV SV MOD A4C:     116.0 ml LV SV MOD BP:      45.2 ml RIGHT VENTRICLE             IVC RV S prime:     16.20 cm/s  IVC diam: 1.90 cm TAPSE (M-mode): 2.4 cm LEFT ATRIUM             Index        RIGHT ATRIUM           Index LA diam:        4.90 cm 2.38 cm/m   RA Area:     22.20 cm LA Vol (A2C):   71.9 ml 34.88 ml/m  RA Volume:   60.70 ml  29.44 ml/m LA Vol (A4C):   83.2 ml 40.36 ml/m LA Biplane Vol: 78.1 ml 37.88 ml/m  AORTIC VALVE AV Area (  Vmax):    0.83 cm AV Area (Vmean):   0.79 cm AV Area (VTI):     0.87 cm AV Vmax:           324.00 cm/s AV Vmean:          234.000 cm/s AV VTI:            0.627 m AV Peak Grad:      42.0 mmHg AV Mean Grad:      25.0 mmHg LVOT Vmax:         77.50 cm/s LVOT Vmean:        53.600 cm/s LVOT VTI:          0.157 m LVOT/AV VTI ratio: 0.25  AORTA Ao Root diam: 2.90 cm Ao Asc diam:  3.70 cm MITRAL VALVE                TRICUSPID VALVE MV Area (PHT): 5.34 cm     TR Peak grad:   29.6 mmHg MV Decel Time: 142 msec     TR Vmax:        272.00 cm/s MV E velocity: 125.00 cm/s MV A velocity: 97.90 cm/s   SHUNTS MV E/A ratio:  1.28         Systemic VTI:  0.16 m                             Systemic Diam: 2.10 cm Oswaldo Milian MD Electronically signed by Oswaldo Milian MD Signature Date/Time: 07/12/2022/1:08:08 PM    Final    CT CHEST ABDOMEN PELVIS WO  CONTRAST  Result Date: 07/12/2022 CLINICAL DATA:  Sepsis.  Chills and shaking. EXAM: CT CHEST, ABDOMEN AND PELVIS WITHOUT CONTRAST TECHNIQUE: Multidetector CT imaging of the chest, abdomen and pelvis was performed following the standard protocol without IV contrast. RADIATION DOSE REDUCTION: This exam was performed according to the departmental dose-optimization program which includes automated exposure control, adjustment of the mA and/or kV according to patient size and/or use of iterative reconstruction technique. COMPARISON:  01/20/2022. FINDINGS: CT CHEST FINDINGS Cardiovascular: The heart is normal in size and there is no pericardial effusion. Three-vessel coronary artery calcifications are noted. There is atherosclerotic calcification of the aorta without evidence of aneurysm. The pulmonary trunk is normal in caliber. Mediastinum/Nodes: No mediastinal or axillary lymphadenopathy. Evaluation of the hila is limited due to lack of IV contrast. The thyroid gland, trachea, and esophagus are within normal limits. There is a small to moderate hiatal hernia. Lungs/Pleura: Atelectasis is noted bilaterally. No effusion or pneumothorax. Musculoskeletal: There is bony deformity of the humeral head on the right, compatible with old healed fracture. Degenerative changes are present in the thoracic spine. Stable compression deformity is present at T12. No acute or suspicious osseous abnormality. CT ABDOMEN PELVIS FINDINGS Hepatobiliary: No focal abnormality. The liver has a nodular contour, compatible with underlying cirrhosis. No biliary ductal dilatation. The gallbladder is without stones. Pancreas: Unremarkable. No pancreatic ductal dilatation or surrounding inflammatory changes. Spleen: The spleen is enlarged measuring 15 cm. Adrenals/Urinary Tract: No adrenal nodule or mass. Vascular calcifications are noted at the renal hila bilaterally. No renal calculi. Perinephric fat stranding is noted bilaterally, greater on the  left than on the right. There is moderate hydroureteronephrosis bilaterally with no evidence of obstructing stone. The bladder is markedly distended. Stomach/Bowel: Small to moderate hiatal hernia. Stomach is within normal limits. Appendix is not seen. No evidence of bowel wall thickening, distention, or  inflammatory changes. No free air or pneumatosis. Vascular/Lymphatic: Aortic atherosclerosis. There is aneurysmal dilatation of the infrarenal abdominal aorta with endograft in place. The aneurysm sac measures 4.8 x 6.3 cm, not significantly changed from the prior exam. There has an enlarged lymph node in the retroperitoneum measuring 1.5 cm, increased from 9 mm on the prior exam. An enlarged lymph node is noted in the along the pelvic wall on the left measuring 1.6 cm. Reproductive: The prostate gland is enlarged. Other: No abdominopelvic ascites. Musculoskeletal: No acute fracture. Degenerative changes are present in the lumbar spine. There is a lucency with cortical breakthrough in the inferior pubic ramus on the right measuring 1.8 cm. Stable hemangioma at L1. IMPRESSION: 1. Moderate hydronephrosis bilaterally with no obstructing stone. The urinary bladder is markedly distended in the prostate gland is enlarged, suggesting bladder outlet obstruction. Perinephric fat stranding is noted bilaterally and clinical correlation is recommended to exclude superimposed infection. 2. Morphologic changes of cirrhosis and portal hypertension. 3. Multiple enlarged lymph nodes in the retroperitoneum and left pelvic side wall, which may be infectious, inflammatory, or neoplastic. 4. Expansile lucency with cortical breakthrough in the inferior pubic ramus on the right. The possibility of metastatic disease can not be excluded. MRI is suggested for further evaluation. 5. Aortic atherosclerosis with aneurysmal dilatation of the infrarenal abdominal aorta measuring up to 6.3 cm with endograft in place, not significantly changed  from the prior exam. 6. Multi-vessel coronary artery calcifications. 7. Remaining incidental findings as described above. Electronically Signed   By: Brett Fairy M.D.   On: 07/12/2022 00:03   DG Chest Port 1 View  Result Date: 07/11/2022 CLINICAL DATA:  Cough and chills. EXAM: PORTABLE CHEST 1 VIEW COMPARISON:  04/29/2022. FINDINGS: Heart is enlarged and the mediastinal contour is within normal limits. There is atherosclerotic calcification of the aorta. Lung volumes are low. No consolidation, effusion, or pneumothorax. There is chronic inferior subluxation and bony deformity of the left humeral head. No acute osseous abnormality. IMPRESSION: 1. No active disease. 2. Cardiomegaly. Electronically Signed   By: Brett Fairy M.D.   On: 07/11/2022 20:31    Cardiac Studies   Echocardiogram 07/12/2022  1. Left ventricular ejection fraction, by estimation, is 30 to 35%. The left ventricle has moderately decreased function. The left ventricle demonstrates regional wall motion abnormalities (see scoring diagram/findings for description). There is mild left ventricular hypertrophy. Left ventricular diastolic parameters are consistent with Grade II diastolic dysfunction (pseudonormalization). Elevated left atrial pressure.   2. Right ventricular systolic function is normal. The right ventricular size is mildly enlarged. There is normal pulmonary artery systolic pressure. The estimated right ventricular systolic pressure is [id number] mmHg.   3. Left atrial size was mildly dilated.   4. The mitral valve is normal in structure. Mild mitral valve regurgitation.   5. The inferior vena cava is normal in size with greater than 50% respiratory variability, suggesting right atrial pressure of 3 mmHg.   6. The aortic valve is calcified. Aortic valve regurgitation is not visualized. Severe aortic valve stenosis. Moderate AS by gradients (MG 25 mmHg, Vmax 3.2 m/s), severe by AVA (0.9cm^2) and DI (0.25). Low SV index (26  cc/m^2), suspect low flow low gradient severe AS FINDINGS   Patient Profile     77 y.o. male with severe multivessel CAD (known chronic occlusions of the RCA and LAD), recent myocardial infarction, depressed left ventricular systolic function, aortic stenosis, metastatic prostate cancer, AAA status post EVAR with evidence of endoleak and severe aneurysm  enlargement, previous stroke with residual mild dysphasia, paroxysmal atrial fibrillation, history of nonsustained VT, DM2, HTN, HLP presents with myocardial infarction in the setting of sepsis from E. coli bacteremia, bladder outlet obstruction and hydronephrosis.  Assessment & Plan    CAD with demand myocardial infarction: Significant increase in cardiac enzymes and definite change in left ventricular systolic function and regional wall motion abnormality, but all in all most likely represents type II infarction with worsening injury in the distribution of the chronically occluded right coronary artery and LAD artery.  Do not think that this is a true acute atherothrombotic event, but it is reasonable decision to treat with intravenous heparin for 48 hours, at least until he is more physically mobile.  Doubt that he would benefit from repeat angiographic evaluation. CHF: Thankfully, despite marked reduction in LV function he does not have heart failure exacerbation.  Not a good candidate for resuming SGLT2 inhibitors due to bladder outlet obstruction and urinary infection.  History of intolerance to ARB/Arni due to worsening renal function.  On beta-blockers. NSVT: No recurrence after correction of electrolyte abnormalities. Sepsis: Rapid improvement with resolution of urinary retention and antibiotic therapy. AS: Reviewed his echocardiograms from yesterday, twice in December, echocardiogram from August.  Clearly has low-flow low gradient aortic stenosis, but I believe this is moderate.  The LVOT signal on the most recent echo was measured to far from  the aortic annulus, underestimating the true stroke-volume.  He does not have any exertional angina/dyspnea/syncope when he performs physical therapy.  He is definitely not a candidate for surgical AVR and is a marginal candidate for TAVR.  Transfemoral approach cannot be performed due to previous EVAR and expanding aortic aneurysm.  Numerous comorbidities make it unlikely that he would do well with alternate access TAVR. Metastatic prostate cancer: After discussion with his oncologist he has decided not to pursue any type of treatment for this.  This is most likely to have the biggest impact on his prognosis. AFib: With previous history of stroke.  Currently in sinus rhythm.  On chronic oral anticoagulation, currently on IV heparin.     For questions or updates, please contact [place] Please consult [email or web address] for contact info under        Signed, Sanda Klein, MD  07/13/2022, 11:18 AM

## 2022-07-13 NOTE — Progress Notes (Signed)
Patient to keep foley in place today per Dr. Doristine Bosworth.

## 2022-07-13 NOTE — Progress Notes (Addendum)
PROGRESS NOTE    Jose Beck  [id number] DOB: 1945-12-08 DOA: 07/11/2022 PCP: Merryl Hacker, No   Brief Narrative:   Jose Beck is a 77 y.o. male with medical history significant of CAD s/p known occlusion of LAD and RCA by cath in 1992 with cath in 2017 showing similar findings with collateral flow noted, chronic combined systolic and diastolic CHF, HTN, HLD, Type 2 DM, GERD, AAA s/p repair complicated by endo leak followed by CT surgery and vascular and prior CVA as well as Afib on Eliquis. Patient presents to ED BIB EMS with complaints of shaking chills. Patient also noted to have n/v in the field and per wife has been ill x 1 week   HPI: Jose Beck is a 77 y.o. male with medical history significant of CAD s/p known occlusion of LAD and RCA by cath in 1992 with cath in 2017 showing similar findings with collateral flow noted, chronic combined systolic and diastolic CHF, HTN, HLD, Type 2 DM, GERD, AAA s/p repair complicated by endo leak followed by CT surgery and vascular and prior CVA as well as Afib on Eliquis. Patient presents to ED BIB EMS with complaints of shaking chills. Patient also noted to have n/v in the field and per wife has been ill x 1 week with  cough/ uri symptoms,intermittent n/v and fatigue. Per wife patient seen by pcp and given supportive treatment of uri symptoms. Patient was also tested for flu and COVID which was noted to be negative.  Patient over the week had not improved and on day of presentation started having shaking chills. Due to this EMS was called. Patient currently notes no chest pain , no current n/v/ no abdominal pain , still had intermittent cough that is not productive.     ED Course:  Wbc 11.1, hgb 10.5 (12.8.1/24),plt 120 mcv 97.2 Na 141, K 3.7, glu 173, cr 1.8 (base 1.1 , ( 1.49, 1.3, 1.8 trend since 12/23) Lipase 43 Lactic 1.8. CT/ab pelvis Moderate hydronephrosis bilaterally with no obstructing stone.the urinary bladder is markedly  distended in the prostate gland is enlarged, suggesting bladder outlet obstruction. Perinephric fat stranding is noted bilaterally and clinical correlation is recommended to exclude superimposed infection.Morphologic changes of cirrhosis and portal hypertension.Multiple enlarged lymph nodes in the retroperitoneum and left pelvic side wall, which may be infectious, inflammatory, or neoplastic.Expansile lucency with cortical breakthrough in the inferior pubic ramus on the right. The possibility of metastatic disease can not be excluded. MRI is suggested for further evaluation.Aortic atherosclerosis with aneurysmal dilatation of the infrarenal abdominal aorta measuring up to 6.3 cm with endograft in place, not significantly changed from the prior exam.Multi-vessel coronary artery calcifications.  Assessment & Plan:   Sepsis due to E. coli UTI -Patient met sepsis criteria upon admission with fever, tachycardia, tachypnea.  Found to have Acute Urinary retention with BOO and B/L hydronephrosis in setting of BPH/history of prostate malignancy -S/p Foley placement in ED.  Continue Rocephin.  Urine culture growing E. coli with no resistance. -continue flomax  -Sepsis physiology resolved. -Follow-up with urology outpatient -he sees urology at [facility]   AKI  -multifactorial due to BOO as well as infection  - s/p foley placement.  Avoid nephrotoxic medications -Renal function improving.   Elevated troponin: Known history of severe CAD -Likely in the setting of demand ischemia. -Patient denies ACS symptoms. -Continue heparin gtt. -Cardiology consulted-appreciate  Paroxysmal Afib -on Eliquis and metoprolol at home.  Currently on heparin drip - currently rate  controlled.  Continue metoprolol.  Ischemic cardiomyopathy Severe AS: -Echo shows ejection fraction of 30 to 35%.  On Lasix at home.  Held due to AKI.  Strict INO's and daily weight.  Monitor signs of fluid overload. -Continue metoprolol.   Cardiology on board.  He is not a great TAVR candidate given his multiple comorbidities.  Cardiology going to discuss with patient and his family  Normocytic anemia: H&H have dropped from 12-9. Continue to monitor closely while patient is on heparin drip.  Transfuse as needed  Thrombocytopenia: -Monitor platelet count closely while on heparin drip  Abdominal Aortic Aneurysm  -s/p repair complicated by endoleak CT noted GM 6.2 stable from prior  -Followed by CT surgery and vascular outpatient   DMII, insulin dependent -A1c is pending. -resume lantus 10 unts sq -Sliding scale insulin.  Monitor blood sugar   Metastatic prostate CA  -Not felt to be a candidate for hormone therapy due to history of stroke and currently therapy options on hold  -[facility] at [facility]   Hypertension  -stable  continue metoprolol   Chronic left shoulder pain  -supportive care  -due to chronic dislocation   Hypomagnesemia: Replenished.    Hypokalemia: Replenished  DVT prophylaxis: Heparin Code Status: Full code Family Communication: Patient's wife present at bedside.  Plan of care discussed with patient in length and he verbalized understanding and agreed with it. Disposition Plan: To be determined  Consultants:  Cardiology  Procedures:  None  Antimicrobials:  Rocephin  Status is: Inpatient    Subjective: Patient seen and examined.  No family at the bedside.  Patient reports that he is doing fine, denies chest pain, shortness of breath, palpitation.  No abdominal pain, fever, chills.  Remained afebrile.  No acute events overnight.  Objective: Vitals:   07/13/22 0400 07/13/22 0500 07/13/22 0600 07/13/22 0751  BP: (!) 121/51 127/63 112/63 (!) 121/58  Pulse: 73 72 68 80  Resp: 19 (!) '23 19 15  '$ Temp:    98.9 F (37.2 C)  TempSrc:    Oral  SpO2: 96% 92% 95% 95%  Weight:  89.1 kg    Height:        Intake/Output Summary (Last 24 hours) at 07/13/2022 1014 Last data filed  at 07/13/2022 0804 Gross per 24 hour  Intake 449.06 ml  Output 3025 ml  Net -2575.94 ml    Filed Weights   07/12/22 0330 07/12/22 0451 07/13/22 0500  Weight: 93.4 kg 92.6 kg 89.1 kg    Examination:  General exam: Appears calm and comfortable, on room air, communicating well, hard of hearing Respiratory system: Clear to auscultation. Respiratory effort normal. Cardiovascular system: Systolic murmur noted, no rubs, gallops or clicks. No pedal edema. Gastrointestinal system: Abdomen is nondistended, soft and nontender. No organomegaly or masses felt. Normal bowel sounds heard. Foley in place. Central nervous system: Alert and oriented.  Following commands Extremities: Unable to lift right arm due to chronic right shoulder pain/dislocation Skin: No rashes, lesions or ulcers Psychiatry: Judgement and insight appear normal. Mood & affect appropriate.    Data Reviewed: I have personally reviewed following labs and imaging studies  CBC: Recent Labs  Lab 07/11/22 1940 07/12/22 0326 07/12/22 0554 07/13/22 0027  WBC 11.1* 10.1 10.1 8.9  NEUTROABS  --  9.2*  --   --   HGB 10.5* 9.9* 9.7* 9.2*  HCT 31.5* 30.2* 28.9* 28.3*  MCV 97.2 98.1 95.1 95.9  PLT 120* 109* 106* 98*    Basic Metabolic  Panel: Recent Labs  Lab 07/11/22 1940 07/12/22 0326 07/13/22 0028  NA 141 139 138  K 3.7 3.8 3.4*  CL 103 103 104  CO2 '22 23 23  '$ GLUCOSE 173* 165* 144*  BUN 38* 37* 31*  CREATININE 1.80* 1.77* 1.67*  CALCIUM 8.9 8.7* 8.3*  MG  --  1.6* 1.8    GFR: Estimated Creatinine Clearance: 40.8 mL/min (A) (by C-G formula based on SCr of 1.67 mg/dL (H)). Liver Function Tests: Recent Labs  Lab 07/11/22 1940 07/12/22 0326 07/13/22 0028  AST 23 73* 81*  ALT '28 29 28  '$ ALKPHOS 61 55 52  BILITOT 0.8 0.6 0.5  PROT 7.1 7.1 6.6  ALBUMIN 3.1* 2.9* 2.5*    Recent Labs  Lab 07/11/22 1940  LIPASE 43    No results for input(s): "AMMONIA" in the last 168 hours. Coagulation Profile: No  results for input(s): "INR", "PROTIME" in the last 168 hours. Cardiac Enzymes: No results for input(s): "CKTOTAL", "CKMB", "CKMBINDEX", "TROPONINI" in the last 168 hours. BNP (last 3 results) No results for input(s): "PROBNP" in the last 8760 hours. HbA1C: No results for input(s): "HGBA1C" in the last 72 hours. CBG: Recent Labs  Lab 07/12/22 1614 07/12/22 1937 07/13/22 0005 07/13/22 0358 07/13/22 0756  GLUCAP 169* 172* 150* 140* 127*    Lipid Profile: No results for input(s): "CHOL", "HDL", "LDLCALC", "TRIG", "CHOLHDL", "LDLDIRECT" in the last 72 hours. Thyroid Function Tests: Recent Labs    07/12/22 0326  TSH 0.673    Anemia Panel: No results for input(s): "VITAMINB12", "FOLATE", "FERRITIN", "TIBC", "IRON", "RETICCTPCT" in the last 72 hours. Sepsis Labs: Recent Labs  Lab 07/11/22 1940  LATICACIDVEN 1.8     Recent Results (from the past 240 hour(s))  Culture, blood (routine x 2)     Status: Abnormal (Preliminary result)   Collection Time: 07/11/22  7:52 PM   Specimen: BLOOD LEFT FOREARM  Result Value Ref Range Status   Specimen Description BLOOD LEFT FOREARM  Final   Special Requests   Final    BOTTLES DRAWN AEROBIC AND ANAEROBIC Blood Culture adequate volume   Culture  Setup Time   Final    GRAM NEGATIVE RODS IN BOTH AEROBIC AND ANAEROBIC BOTTLES CRITICAL RESULT CALLED TO, READ BACK BY AND VERIFIED WITH: PHARMD HALLEY V. [id number] FCP    Culture (A)  Final    ESCHERICHIA COLI SUSCEPTIBILITIES TO FOLLOW Performed at [facility], [place] [address] [place], [place] [address]    Report Status PENDING  Incomplete  Blood Culture ID Panel (Reflexed)     Status: Abnormal   Collection Time: 07/11/22  7:52 PM  Result Value Ref Range Status   Enterococcus faecalis NOT DETECTED NOT DETECTED Final   Enterococcus Faecium NOT DETECTED NOT DETECTED Final   Listeria monocytogenes NOT DETECTED NOT DETECTED Final   Staphylococcus species NOT DETECTED NOT DETECTED  Final   Staphylococcus aureus (BCID) NOT DETECTED NOT DETECTED Final   Staphylococcus epidermidis NOT DETECTED NOT DETECTED Final   Staphylococcus lugdunensis NOT DETECTED NOT DETECTED Final   Streptococcus species NOT DETECTED NOT DETECTED Final   Streptococcus agalactiae NOT DETECTED NOT DETECTED Final   Streptococcus pneumoniae NOT DETECTED NOT DETECTED Final   Streptococcus pyogenes NOT DETECTED NOT DETECTED Final   A.calcoaceticus-baumannii NOT DETECTED NOT DETECTED Final   Bacteroides fragilis NOT DETECTED NOT DETECTED Final   Enterobacterales DETECTED (A) NOT DETECTED Final    Comment: Enterobacterales represent a large order of gram negative bacteria, not a single  organism. CRITICAL RESULT CALLED TO, READ BACK BY AND VERIFIED WITH: PHARMD HALLEY V. 1023 [id number] FCP    Enterobacter cloacae complex NOT DETECTED NOT DETECTED Final   Escherichia coli DETECTED (A) NOT DETECTED Final    Comment: CRITICAL RESULT CALLED TO, READ BACK BY AND VERIFIED WITH: PHARMD HALLEY V. 1023 [id number] FCP    Klebsiella aerogenes NOT DETECTED NOT DETECTED Final   Klebsiella oxytoca NOT DETECTED NOT DETECTED Final   Klebsiella pneumoniae NOT DETECTED NOT DETECTED Final   Proteus species NOT DETECTED NOT DETECTED Final   Salmonella species NOT DETECTED NOT DETECTED Final   Serratia marcescens NOT DETECTED NOT DETECTED Final   Haemophilus influenzae NOT DETECTED NOT DETECTED Final   Neisseria meningitidis NOT DETECTED NOT DETECTED Final   Pseudomonas aeruginosa NOT DETECTED NOT DETECTED Final   Stenotrophomonas maltophilia NOT DETECTED NOT DETECTED Final   Candida albicans NOT DETECTED NOT DETECTED Final   Candida auris NOT DETECTED NOT DETECTED Final   Candida glabrata NOT DETECTED NOT DETECTED Final   Candida krusei NOT DETECTED NOT DETECTED Final   Candida parapsilosis NOT DETECTED NOT DETECTED Final   Candida tropicalis NOT DETECTED NOT DETECTED Final   Cryptococcus neoformans/gattii NOT DETECTED  NOT DETECTED Final   CTX-M ESBL NOT DETECTED NOT DETECTED Final   Carbapenem resistance IMP NOT DETECTED NOT DETECTED Final   Carbapenem resistance KPC NOT DETECTED NOT DETECTED Final   Carbapenem resistance NDM NOT DETECTED NOT DETECTED Final   Carbapenem resist OXA 48 LIKE NOT DETECTED NOT DETECTED Final   Carbapenem resistance VIM NOT DETECTED NOT DETECTED Final    Comment: Performed at [facility], [address], [place], [place] [address]  Resp panel by RT-PCR (RSV, Flu A&B, Covid) Anterior Nasal Swab     Status: None   Collection Time: 07/11/22  8:02 PM   Specimen: Anterior Nasal Swab  Result Value Ref Range Status   SARS Coronavirus 2 by RT PCR NEGATIVE NEGATIVE Final   Influenza A by PCR NEGATIVE NEGATIVE Final   Influenza B by PCR NEGATIVE NEGATIVE Final    Comment: (NOTE) The Xpert Xpress SARS-CoV-2/FLU/RSV plus assay is intended as an aid in the diagnosis of influenza from Nasopharyngeal swab specimens and should not be used as a sole basis for treatment. Nasal washings and aspirates are unacceptable for Xpert Xpress SARS-CoV-2/FLU/RSV testing.  Fact Sheet for Patients: EntrepreneurPulse.com.au  Fact Sheet for Healthcare Providers: IncredibleEmployment.be  This test is not yet approved or cleared by the [place] FDA and has been authorized for detection and/or diagnosis of SARS-CoV-2 by FDA under an Emergency Use Authorization (EUA). This EUA will remain in effect (meaning this test can be used) for the duration of the COVID-19 declaration under Section 564(b)(1) of the Act, 21 U.S.C. section 360bbb-3(b)(1), unless the authorization is terminated or revoked.     Resp Syncytial Virus by PCR NEGATIVE NEGATIVE Final    Comment: (NOTE) Fact Sheet for Patients: EntrepreneurPulse.com.au  Fact Sheet for Healthcare Providers: IncredibleEmployment.be  This test is not yet approved or  cleared by the [place] FDA and has been authorized for detection and/or diagnosis of SARS-CoV-2 by FDA under an Emergency Use Authorization (EUA). This EUA will remain in effect (meaning this test can be used) for the duration of the COVID-19 declaration under Section 564(b)(1) of the Act, 21 U.S.C. section 360bbb-3(b)(1), unless the authorization is terminated or revoked.  Performed at [facility], [place] [address], [place], [place] [address]   Culture,  blood (routine x 2)     Status: None (Preliminary result)   Collection Time: 07/11/22  8:20 PM   Specimen: BLOOD RIGHT FOREARM  Result Value Ref Range Status   Specimen Description BLOOD RIGHT FOREARM  Final   Special Requests   Final    BOTTLES DRAWN AEROBIC AND ANAEROBIC Blood Culture adequate volume   Culture  Setup Time GRAM NEGATIVE RODS ANAEROBIC BOTTLE ONLY   Final   Culture   Final    GRAM NEGATIVE RODS IDENTIFICATION TO FOLLOW Performed at [facility], [place] [address] [place], [place] [address]    Report Status PENDING  Incomplete  Urine Culture (for pregnant, neutropenic or urologic patients or patients with an indwelling urinary catheter)     Status: Abnormal (Preliminary result)   Collection Time: 07/12/22  3:04 AM   Specimen: Urine, Clean Catch  Result Value Ref Range Status   Specimen Description URINE, CLEAN CATCH  Final   Special Requests NONE  Final   Culture (A)  Final    >=100,000 COLONIES/mL ESCHERICHIA COLI SUSCEPTIBILITIES TO FOLLOW Performed at [facility], [place] [address], [place], [place] [address]    Report Status PENDING  Incomplete  MRSA Next Gen by PCR, Nasal     Status: None   Collection Time: 07/12/22  4:40 AM   Specimen: Nasal Mucosa; Nasal Swab  Result Value Ref Range Status   MRSA by PCR Next Gen NOT DETECTED NOT DETECTED Final    Comment: (NOTE) The GeneXpert MRSA Assay (FDA approved for NASAL specimens only), is one component of a comprehensive MRSA colonization  surveillance program. It is not intended to diagnose MRSA infection nor to guide or monitor treatment for MRSA infections. Test performance is not FDA approved in patients less than 68 years old. Performed at [facility], [place] [address] [place], [place] [address]       Radiology Studies: ECHOCARDIOGRAM COMPLETE  Result Date: 07/12/2022    ECHOCARDIOGRAM REPORT   Patient Name:   SOPHEAK KISSLING Date of Exam: 07/12/2022 Medical Rec #:  [id number]           Height:       68.0 in Accession #:    [id number]          Weight:       204.1 lb Date of Birth:  Oct 12, 1945          BSA:          2.062 m Patient Age:    43 years            BP:           129/72 mmHg Patient Gender: M                   HR:           90 bpm. Exam Location:  Inpatient Procedure: 2D Echo, Cardiac Doppler, Color Doppler and Intracardiac            Opacification Agent Indications:    NSTEMI I21.4  History:        Patient has prior history of Echocardiogram examinations, most                 recent 04/28/2022. CHF, CAD; Arrythmias:Atrial Fibrillation.  Sonographer:    Luane School RDCS Referring Phys: [id number] [facility] A THOMAS  Sonographer Comments: Suboptimal apical window and suboptimal subcostal window. IMPRESSIONS  1. Left ventricular ejection fraction, by estimation, is 30  to 35%. The left ventricle has moderately decreased function. The left ventricle demonstrates regional wall motion abnormalities (see scoring diagram/findings for description). There is mild left ventricular hypertrophy. Left ventricular diastolic parameters are consistent with Grade II diastolic dysfunction (pseudonormalization). Elevated left atrial pressure.  2. Right ventricular systolic function is normal. The right ventricular size is mildly enlarged. There is normal pulmonary artery systolic pressure. The estimated right ventricular systolic pressure is [id number] mmHg.  3. Left atrial size was mildly dilated.  4. The mitral valve is normal in structure. Mild  mitral valve regurgitation.  5. The inferior vena cava is normal in size with greater than 50% respiratory variability, suggesting right atrial pressure of 3 mmHg.  6. The aortic valve is calcified. Aortic valve regurgitation is not visualized. Severe aortic valve stenosis. Moderate AS by gradients (MG 25 mmHg, Vmax 3.2 m/s), severe by AVA (0.9cm^2) and DI (0.25). Low SV index (26 cc/m^2), suspect low flow low gradient severe AS FINDINGS  Left Ventricle: Left ventricular ejection fraction, by estimation, is 30 to 35%. The left ventricle has moderately decreased function. The left ventricle demonstrates regional wall motion abnormalities. Definity contrast agent was given IV to delineate the left ventricular endocardial borders. The left ventricular internal cavity size was normal in size. There is mild left ventricular hypertrophy. Left ventricular diastolic parameters are consistent with Grade II diastolic dysfunction (pseudonormalization). Elevated left atrial pressure.  LV Wall Scoring: The mid and distal anterior septum, mid and distal inferior wall, mid inferoseptal segment, apical anterior segment, and apex are akinetic. The anterior wall, entire lateral wall, basal anteroseptal segment, basal inferior segment, and basal inferoseptal segment are normal. Right Ventricle: The right ventricular size is mildly enlarged. No increase in right ventricular wall thickness. Right ventricular systolic function is normal. There is normal pulmonary artery systolic pressure. The tricuspid regurgitant velocity is 2.72  m/s, and with an assumed right atrial pressure of 3 mmHg, the estimated right ventricular systolic pressure is [id number] mmHg. Left Atrium: Left atrial size was mildly dilated. Right Atrium: Right atrial size was normal in size. Pericardium: There is no evidence of pericardial effusion. Mitral Valve: The mitral valve is normal in structure. Mild mitral valve regurgitation. Tricuspid Valve: The tricuspid valve is  normal in structure. Tricuspid valve regurgitation is trivial. Aortic Valve: The aortic valve is calcified. Aortic valve regurgitation is not visualized. Severe aortic stenosis is present. Aortic valve mean gradient measures 25.0 mmHg. Aortic valve peak gradient measures 42.0 mmHg. Aortic valve area, by VTI measures  0.87 cm. Pulmonic Valve: The pulmonic valve was not well visualized. Pulmonic valve regurgitation is trivial. Aorta: The aortic root and ascending aorta are structurally normal, with no evidence of dilitation. Venous: The inferior vena cava is normal in size with greater than 50% respiratory variability, suggesting right atrial pressure of 3 mmHg. IAS/Shunts: The interatrial septum was not well visualized.  LEFT VENTRICLE PLAX 2D LVIDd:         5.50 cm      Diastology LVIDs:         4.00 cm      LV e' medial:    5.98 cm/s LV PW:         1.40 cm      LV E/e' medial:  20.9 LV IVS:        1.40 cm      LV e' lateral:   8.81 cm/s LVOT diam:     2.10 cm      LV E/e' lateral: 14.2  LV SV:         54 LV SV Index:   26 LVOT Area:     3.46 cm  LV Volumes (MOD) LV vol d, MOD A2C: 129.0 ml LV vol d, MOD A4C: 116.0 ml LV vol s, MOD A2C: 81.8 ml LV vol s, MOD A4C: 80.9 ml LV SV MOD A2C:     47.2 ml LV SV MOD A4C:     116.0 ml LV SV MOD BP:      45.2 ml RIGHT VENTRICLE             IVC RV S prime:     16.20 cm/s  IVC diam: 1.90 cm TAPSE (M-mode): 2.4 cm LEFT ATRIUM             Index        RIGHT ATRIUM           Index LA diam:        4.90 cm 2.38 cm/m   RA Area:     22.20 cm LA Vol (A2C):   71.9 ml 34.88 ml/m  RA Volume:   60.70 ml  29.44 ml/m LA Vol (A4C):   83.2 ml 40.36 ml/m LA Biplane Vol: 78.1 ml 37.88 ml/m  AORTIC VALVE AV Area (Vmax):    0.83 cm AV Area (Vmean):   0.79 cm AV Area (VTI):     0.87 cm AV Vmax:           324.00 cm/s AV Vmean:          234.000 cm/s AV VTI:            0.627 m AV Peak Grad:      42.0 mmHg AV Mean Grad:      25.0 mmHg LVOT Vmax:         77.50 cm/s LVOT Vmean:        53.600  cm/s LVOT VTI:          0.157 m LVOT/AV VTI ratio: 0.25  AORTA Ao Root diam: 2.90 cm Ao Asc diam:  3.70 cm MITRAL VALVE                TRICUSPID VALVE MV Area (PHT): 5.34 cm     TR Peak grad:   29.6 mmHg MV Decel Time: 142 msec     TR Vmax:        272.00 cm/s MV E velocity: 125.00 cm/s MV A velocity: 97.90 cm/s   SHUNTS MV E/A ratio:  1.28         Systemic VTI:  0.16 m                             Systemic Diam: 2.10 cm Oswaldo Milian MD Electronically signed by Oswaldo Milian MD Signature Date/Time: 07/12/2022/1:08:08 PM    Final    CT CHEST ABDOMEN PELVIS WO CONTRAST  Result Date: 07/12/2022 CLINICAL DATA:  Sepsis.  Chills and shaking. EXAM: CT CHEST, ABDOMEN AND PELVIS WITHOUT CONTRAST TECHNIQUE: Multidetector CT imaging of the chest, abdomen and pelvis was performed following the standard protocol without IV contrast. RADIATION DOSE REDUCTION: This exam was performed according to the departmental dose-optimization program which includes automated exposure control, adjustment of the mA and/or kV according to patient size and/or use of iterative reconstruction technique. COMPARISON:  01/20/2022. FINDINGS: CT CHEST FINDINGS Cardiovascular: The heart is normal in size and there is no pericardial effusion. Three-vessel coronary artery calcifications are noted.  There is atherosclerotic calcification of the aorta without evidence of aneurysm. The pulmonary trunk is normal in caliber. Mediastinum/Nodes: No mediastinal or axillary lymphadenopathy. Evaluation of the hila is limited due to lack of IV contrast. The thyroid gland, trachea, and esophagus are within normal limits. There is a small to moderate hiatal hernia. Lungs/Pleura: Atelectasis is noted bilaterally. No effusion or pneumothorax. Musculoskeletal: There is bony deformity of the humeral head on the right, compatible with old healed fracture. Degenerative changes are present in the thoracic spine. Stable compression deformity is present at T12. No  acute or suspicious osseous abnormality. CT ABDOMEN PELVIS FINDINGS Hepatobiliary: No focal abnormality. The liver has a nodular contour, compatible with underlying cirrhosis. No biliary ductal dilatation. The gallbladder is without stones. Pancreas: Unremarkable. No pancreatic ductal dilatation or surrounding inflammatory changes. Spleen: The spleen is enlarged measuring 15 cm. Adrenals/Urinary Tract: No adrenal nodule or mass. Vascular calcifications are noted at the renal hila bilaterally. No renal calculi. Perinephric fat stranding is noted bilaterally, greater on the left than on the right. There is moderate hydroureteronephrosis bilaterally with no evidence of obstructing stone. The bladder is markedly distended. Stomach/Bowel: Small to moderate hiatal hernia. Stomach is within normal limits. Appendix is not seen. No evidence of bowel wall thickening, distention, or inflammatory changes. No free air or pneumatosis. Vascular/Lymphatic: Aortic atherosclerosis. There is aneurysmal dilatation of the infrarenal abdominal aorta with endograft in place. The aneurysm sac measures 4.8 x 6.3 cm, not significantly changed from the prior exam. There has an enlarged lymph node in the retroperitoneum measuring 1.5 cm, increased from 9 mm on the prior exam. An enlarged lymph node is noted in the along the pelvic wall on the left measuring 1.6 cm. Reproductive: The prostate gland is enlarged. Other: No abdominopelvic ascites. Musculoskeletal: No acute fracture. Degenerative changes are present in the lumbar spine. There is a lucency with cortical breakthrough in the inferior pubic ramus on the right measuring 1.8 cm. Stable hemangioma at L1. IMPRESSION: 1. Moderate hydronephrosis bilaterally with no obstructing stone. The urinary bladder is markedly distended in the prostate gland is enlarged, suggesting bladder outlet obstruction. Perinephric fat stranding is noted bilaterally and clinical correlation is recommended to  exclude superimposed infection. 2. Morphologic changes of cirrhosis and portal hypertension. 3. Multiple enlarged lymph nodes in the retroperitoneum and left pelvic side wall, which may be infectious, inflammatory, or neoplastic. 4. Expansile lucency with cortical breakthrough in the inferior pubic ramus on the right. The possibility of metastatic disease can not be excluded. MRI is suggested for further evaluation. 5. Aortic atherosclerosis with aneurysmal dilatation of the infrarenal abdominal aorta measuring up to 6.3 cm with endograft in place, not significantly changed from the prior exam. 6. Multi-vessel coronary artery calcifications. 7. Remaining incidental findings as described above. Electronically Signed   By: Brett Fairy M.D.   On: 07/12/2022 00:03   DG Chest Port 1 View  Result Date: 07/11/2022 CLINICAL DATA:  Cough and chills. EXAM: PORTABLE CHEST 1 VIEW COMPARISON:  04/29/2022. FINDINGS: Heart is enlarged and the mediastinal contour is within normal limits. There is atherosclerotic calcification of the aorta. Lung volumes are low. No consolidation, effusion, or pneumothorax. There is chronic inferior subluxation and bony deformity of the left humeral head. No acute osseous abnormality. IMPRESSION: 1. No active disease. 2. Cardiomegaly. Electronically Signed   By: Brett Fairy M.D.   On: 07/11/2022 20:31    Scheduled Meds:  atorvastatin  80 mg Oral q1800   Chlorhexidine Gluconate Cloth  6  each Topical Daily   insulin aspart  0-6 Units Subcutaneous Q4H   insulin detemir  10 Units Subcutaneous Daily   metoprolol succinate  25 mg Oral Daily   potassium chloride  40 mEq Oral BID   tamsulosin  0.4 mg Oral Daily   Continuous Infusions:  cefTRIAXone (ROCEPHIN)  IV Stopped (07/12/22 2138)   heparin 1,200 Units/hr (07/13/22 0413)     LOS: 1 day   Time spent: 35 minutes   Lilias Lorensen Loann Quill, MD [facility]  If 7PM-7AM, please contact night-coverage [email or web address] 07/13/2022,  10:14 AM

## 2022-07-13 NOTE — Progress Notes (Signed)
ANTICOAGULATION CONSULT NOTE  Pharmacy Consult for heparin Indication:  Afib and elevated troponin  Allergies  Allergen Reactions   Cheese Nausea And Vomiting   Pravastatin Anxiety    Anxiety attack    Patient Measurements: Height: '5\' 8"'$  (172.7 cm) Weight: 89.1 kg (196 lb 6.9 oz) IBW/kg (Calculated) : 68.4 Heparin Dosing Weight: 90kg  Vital Signs: Temp: 98.4 F (36.9 C) (03/03 1125) Temp Source: Oral (03/03 1125) BP: 121/73 (03/03 1125) Pulse Rate: 75 (03/03 1125)  Labs: Recent Labs    07/11/22 1940 07/11/22 2237 07/12/22 0326 07/12/22 0554 07/12/22 0852 07/12/22 1044 07/12/22 1044 07/12/22 2013 07/13/22 0027 07/13/22 0028 07/13/22 1247  HGB 10.5*  --  9.9* 9.7*  --   --   --   --  9.2*  --   --   HCT 31.5*  --  30.2* 28.9*  --   --   --   --  28.3*  --   --   PLT 120*  --  109* 106*  --   --   --   --  98*  --   --   APTT  --   --   --   --   --  40*   < > 113* 126*  --  85*  HEPARINUNFRC  --   --   --   --   --  >1.10*  --   --  >1.10*  --   --   CREATININE 1.80*  --  1.77*  --   --   --   --   --   --  1.67*  --   TROPONINIHS  --    < > 16,169* 14,490* 12,829* 13,452*  --   --   --   --   --    < > = values in this interval not displayed.     Estimated Creatinine Clearance: 40.8 mL/min (A) (by C-G formula based on SCr of 1.67 mg/dL (H)).   Medical History: Past Medical History:  Diagnosis Date   Abdominal aortic aneurysm (AAA) ([place])    Aortic stenosis    a. 11/2015 Echo: EF 55-60%, Gr1 DD, mild AS.   Chronic combined systolic and diastolic CHF (congestive heart failure) ([place])    a. 07/2015: TEE showing EF of 40% b. 11/2015: Echo w/ EF 55-60%, Grade 1 DD, mild AS.   Coronary artery disease    a. patient reports 100% stenosis of LAD and RCA by cath in 1999. b. 11/2015 Cath: LM nl, LAD 100 - fills by L->L collats from D1, LCX 60d, OM1 lalrge/nl, OM2 small/nl, RCA 168m L->R collats.EF 35-45% (55-60% by echo).   Diabetes mellitus without complication ([place]     Type II   GERD (gastroesophageal reflux disease)    Hyperlipemia    Hypertension    Hypertensive heart disease    [place]    a. 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  Aptt is at goal this afteroon.  No known issues with IV infusion.  No overt bleeding or complications noted.  Goal of Therapy:  Heparin level 0.3-0.7 units/ml aPTT 66-102 seconds Monitor platelets by anticoagulation protocol: Yes   Plan:  Continue heparin at  1200 units/hr. Monitor heparin levels, aPTT (while apixaban affects anti-Xa assay), and CBC daily.  [place] Crane, Roylene Reason, BCCP Clinical Pharmacist  07/13/2022 3:12 PM   [facility] pharmacy phone numbers are listed on [email or web address]

## 2022-07-13 NOTE — Progress Notes (Signed)
ANTICOAGULATION CONSULT NOTE - Follow Up Consult  Pharmacy Consult for heparin Indication: atrial fibrillation  Labs: Recent Labs    07/11/22 1940 07/11/22 2237 07/12/22 0326 07/12/22 0554 07/12/22 0852 07/12/22 1044 07/12/22 2013 07/13/22 0027 07/13/22 0028  HGB 10.5*  --  9.9* 9.7*  --   --   --  9.2*  --   HCT 31.5*  --  30.2* 28.9*  --   --   --  28.3*  --   PLT 120*  --  109* 106*  --   --   --  98*  --   APTT  --   --   --   --   --  40* 113* 126*  --   HEPARINUNFRC  --   --   --   --   --  >1.10*  --  >1.10*  --   CREATININE 1.80*  --  1.77*  --   --   --   --   --  1.67*  TROPONINIHS  --    < > 16,169* 14,490* 12,829* 13,452*  --   --   --    < > = values in this interval not displayed.    Assessment: 77yo male supratherapeutic on heparin with higher PTT despite decreased rate; pt has been experiencing worsening hematuria.  Goal of Therapy:  aPTT 66-102 seconds   Plan:  Per direction from cards heparin infusion is being held x4 hours. When resumed will decrease heparin infusion to previous rate of 1200 units/hr and check PTT in 8 hours.    Wynona Neat, PharmD, BCPS  07/13/2022,2:28 AM

## 2022-07-14 ENCOUNTER — Ambulatory Visit: Payer: Medicare Other | Admitting: Physical Therapy

## 2022-07-14 DIAGNOSIS — I350 Nonrheumatic aortic (valve) stenosis: Secondary | ICD-10-CM

## 2022-07-14 DIAGNOSIS — I4729 Other ventricular tachycardia: Secondary | ICD-10-CM | POA: Diagnosis not present

## 2022-07-14 DIAGNOSIS — I480 Paroxysmal atrial fibrillation: Secondary | ICD-10-CM

## 2022-07-14 DIAGNOSIS — I219 Acute myocardial infarction, unspecified: Secondary | ICD-10-CM | POA: Diagnosis not present

## 2022-07-14 DIAGNOSIS — A419 Sepsis, unspecified organism: Secondary | ICD-10-CM | POA: Diagnosis not present

## 2022-07-14 LAB — CBC
HCT: 26.9 % — ABNORMAL LOW (ref 39.0–52.0)
Hemoglobin: 9 g/dL — ABNORMAL LOW (ref 13.0–17.0)
MCH: 31.8 pg (ref 26.0–34.0)
MCHC: 33.5 g/dL (ref 30.0–36.0)
MCV: 95.1 fL (ref 80.0–100.0)
Platelets: 103 10*3/uL — ABNORMAL LOW (ref 150–400)
RBC: 2.83 MIL/uL — ABNORMAL LOW (ref 4.22–5.81)
RDW: 14.1 % (ref 11.5–15.5)
WBC: 6.1 10*3/uL (ref 4.0–10.5)
nRBC: 0 % (ref 0.0–0.2)

## 2022-07-14 LAB — APTT: aPTT: 87 seconds — ABNORMAL HIGH (ref 24–36)

## 2022-07-14 LAB — MAGNESIUM: Magnesium: 1.7 mg/dL (ref 1.7–2.4)

## 2022-07-14 LAB — GLUCOSE, CAPILLARY
Glucose-Capillary: 110 mg/dL — ABNORMAL HIGH (ref 70–99)
Glucose-Capillary: 113 mg/dL — ABNORMAL HIGH (ref 70–99)
Glucose-Capillary: 120 mg/dL — ABNORMAL HIGH (ref 70–99)
Glucose-Capillary: 125 mg/dL — ABNORMAL HIGH (ref 70–99)
Glucose-Capillary: 135 mg/dL — ABNORMAL HIGH (ref 70–99)
Glucose-Capillary: 191 mg/dL — ABNORMAL HIGH (ref 70–99)
Glucose-Capillary: 195 mg/dL — ABNORMAL HIGH (ref 70–99)

## 2022-07-14 LAB — BASIC METABOLIC PANEL
Anion gap: 10 (ref 5–15)
BUN: 33 mg/dL — ABNORMAL HIGH (ref 8–23)
CO2: 23 mmol/L (ref 22–32)
Calcium: 8 mg/dL — ABNORMAL LOW (ref 8.9–10.3)
Chloride: 102 mmol/L (ref 98–111)
Creatinine, Ser: 1.61 mg/dL — ABNORMAL HIGH (ref 0.61–1.24)
GFR, Estimated: 44 mL/min — ABNORMAL LOW (ref >60)
Glucose, Bld: 119 mg/dL — ABNORMAL HIGH (ref 70–99)
Potassium: 4.2 mmol/L (ref 3.5–5.1)
Sodium: 135 mmol/L (ref 135–145)

## 2022-07-14 LAB — HEMOGLOBIN A1C
Hgb A1c MFr Bld: 6.4 % — ABNORMAL HIGH (ref 4.8–5.6)
Mean Plasma Glucose: 137 mg/dL

## 2022-07-14 LAB — HEPARIN LEVEL (UNFRACTIONATED): Heparin Unfractionated: >1.1 IU/mL — ABNORMAL HIGH (ref 0.30–0.70)

## 2022-07-14 MED ORDER — MAGNESIUM HYDROXIDE 400 MG/5ML PO SUSP: 30.0000 mL | Freq: Every day | ORAL | Status: DC | PRN

## 2022-07-14 MED ORDER — MAGNESIUM SULFATE 2 GM/50ML IV SOLN
2.0000 g | Freq: Once | INTRAVENOUS | Status: AC
  Administered 2022-07-14: 2 g via INTRAVENOUS
  Filled 2022-07-14: qty 50

## 2022-07-14 MED ORDER — FLEET ENEMA 7-19 GM/118ML RE ENEM: 1.0000 | ENEMA | Freq: Every day | RECTAL | Status: DC | PRN

## 2022-07-14 MED ORDER — POLYETHYLENE GLYCOL 3350 17 G PO PACK
17.0000 g | PACK | Freq: Every day | ORAL | Status: DC
  Administered 2022-07-14 – 2022-07-15 (×2): 17 g via ORAL
  Filled 2022-07-14 (×2): qty 1

## 2022-07-14 NOTE — Progress Notes (Cosign Needed)
Patient admitted for Sepsis due to E. coli UTI . Patient is on iv antibiotics.  Elevated troponin and afib. Patient on heparin drip.  TOC following.

## 2022-07-14 NOTE — Progress Notes (Addendum)
PROGRESS NOTE    Jose Beck  [id number] DOB: [birth year] DOA: 07/11/2022 PCP: Merryl Hacker, No   Brief Narrative:    Jose Beck is a 77 y.o. male with medical history significant of CAD s/p known occlusion of LAD and RCA by cath in 1992 with cath in 2017 showing similar findings with collateral flow noted, chronic combined systolic and diastolic CHF, hypertension, hyperlipidemia, type 2 diabetes, GERD,  AAA s/p repair complicated by endo leak followed by CT surgery and vascular and prior CVA as well as Afib on Eliquis hospital with complaints of shaking and chills with nausea and vomiting.  He had been feeling ill for at least 1 week at home.  In the ED, patient had leukocytosis with elevated creatinine at 1.8 from baseline 1.1.  Lactate was 1.8.  CT/ab pelvis showed Moderate hydronephrosis bilaterally with no obstructing stone. Urinary bladder was distended with prostatic enlargement suggestive bladder outlet obstruction with perinephric fat stranding. .Multiple enlarged lymph nodes in the retroperitoneum and left pelvic side wall. Aortic atherosclerosis with aneurysmal dilatation of the infrarenal abdominal aorta measuring up to 6.3 cm with endograft in place, not significantly changed from the prior exam. Patient was then admitted hospital for further evaluation and treatment.  Assessment & Plan:   Sepsis due to E. coli UTI Patient met sepsis criteria on presentation with fever tachycardia tachypnea and was noted to have acute urinary retention with bladder outlet obstruction and bilateral hydronephrosis.  Status post Foley placement.  On Rocephin.  Urine culture showing E. coli.  Continue Flomax.  Follow-up with urology as outpatient, patient follows up with [facility] urology.  Spoke with Dr. Lovena Neighbours urology in-house who looked at the CT scan and due to the nature of hydronephrosis and bladder recommends continuation of Foley catheter for at least 1 week and follow-up with his  outpatient urologist.  Communicated this with the patient's wife as well.  Acute kidney injury. Multifactorial from bladder outlet obstruction, infection.  Status post Foley catheter placement.  Latest creatinine of 1.6.  Continue to monitor.   Elevated troponin, known history of severe CAD Cardiology on board and he is on heparin drip.  Likely demand ischemia.  2D echocardiogram with wall motion abnormality.  Cardiology recommended 48 hours of IV heparin for completed tomorrow AM.  Paroxysmal Afib Patient is on Eliquis and metoprolol at home.  Currently on heparin drip.  Rate controlled.  Ischemic cardiomyopathy with moderate AS: 2D echo shows ejection fraction of 30 to 35%.  Strict intake and output charting, on Lasix at home.  Continue metoprolol.  Cardiology on board.  Not a candidate for TAVR.  Normocytic anemia:  Latest hemoglobin of 9.0 from initial 12.0.  Hemoglobin on 07/13/2022 was 9.2.  On heparin drip.  Continue to monitor closely.  Transfuse for ongoing bleeding/hemoglobin less than 7.  Thrombocytopenia: Currently on heparin drip.  Latest platelet count of 103.  Continue to monitor.  Abdominal Aortic Aneurysm  -s/p repair complicated by endoleak. Followed by CT surgery and vascular outpatient   DM 2 , insulin dependent Hemoglobin A1c pending.  Continue Lantus sliding scale insulin.   Metastatic prostate CA  -Not felt to be a candidate for hormone therapy due to history of stroke and currently therapy options on hold.  Follows up [facility]rology at [facility]   Hypertension  Blood pressure seems to be stable at this time.  Latest blood pressure 108/ 62.  Continue metoprolol.   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  Supportive care.  Hypomagnesemia:  Improved.  Latest magnesium of 1.7.  Replenished with 2 g of IV magnesium sulfate.  Hypokalemia: Improved.  Latest potassium 4.2.  DVT prophylaxis: Heparin subcu  Code Status: Full  code  Family Communication:   Spoke up with the patient's wife at bedside and updated her about the clinical condition of the patient.. .  Disposition Plan: To be determined  Consultants:  Cardiology  Procedures:  None  Antimicrobials:  Rocephin  Status is: Inpatient    Subjective: Today, patient was seen and examined at bedside.  Patient denies any nausea, vomiting, fever, chills or rigor.  Denies any shortness of breath cough or chest pain at this time.  Patient's family inquiring about Foley catheter.  Objective: Vitals:   07/13/22 1959 07/14/22 0023 07/14/22 0406 07/14/22 0730  BP: 110/68 117/66 117/67 108/62  Pulse: 72 72 71 76  Resp: 20 (!) 23 (!) 23 20  Temp: 98.6 F (37 C) 99.3 F (37.4 C) 97.6 F (36.4 C) 98.8 F (37.1 C)  TempSrc: Oral Oral Oral   SpO2: 96% 96% 96% 96%  Weight:   89.1 kg   Height:        Intake/Output Summary (Last 24 hours) at 07/14/2022 0750 Last data filed at 07/14/2022 0734 Gross per 24 hour  Intake 1199.96 ml  Output 2600 ml  Net -1400.04 ml    Filed Weights   07/12/22 0451 07/13/22 0500 07/14/22 0406  Weight: 92.6 kg 89.1 kg 89.1 kg    Physical examination: Body mass index is 29.87 kg/m.   General:  Average built, not in obvious distress, hard of hearing HENT:   No scleral pallor or icterus noted. Oral mucosa is moist.  Chest:  Clear breath sounds.  Diminished breath sounds bilaterally. No crackles or wheezes.  CVS: S1 &S2 heard.  Systolic murmur noted regular rate and rhythm. Abdomen: Soft, nontender, nondistended.  Bowel sounds are heard.  Foley catheter in place. Extremities: No cyanosis, clubbing or edema.  Peripheral pulses are palpable.  Difficulty lifting the right arm Psych: Alert, awake and oriented, normal mood CNS:  No cranial nerve deficits.  Power equal in all extremities.   Skin: Warm and dry.  No rashes noted.  Data Reviewed: I have personally reviewed following labs and imaging studies  CBC: Recent  Labs  Lab 07/11/22 1940 07/12/22 0326 07/12/22 0554 07/13/22 0027 07/14/22 0020  WBC 11.1* 10.1 10.1 8.9 6.1  NEUTROABS  --  9.2*  --   --   --   HGB 10.5* 9.9* 9.7* 9.2* 9.0*  HCT 31.5* 30.2* 28.9* 28.3* 26.9*  MCV 97.2 98.1 95.1 95.9 95.1  PLT 120* 109* 106* 98* 103*    Basic Metabolic Panel: Recent Labs  Lab 07/11/22 1940 07/12/22 0326 07/13/22 0028 07/14/22 0020  NA 141 139 138 135  K 3.7 3.8 3.4* 4.2  CL 103 103 104 102  CO2 '22 23 23 23  '$ GLUCOSE 173* 165* 144* 119*  BUN 38* 37* 31* 33*  CREATININE 1.80* 1.77* 1.67* 1.61*  CALCIUM 8.9 8.7* 8.3* 8.0*  MG  --  1.6* 1.8 1.7    GFR: Estimated Creatinine Clearance: 42.3 mL/min (A) (by C-G formula based on SCr of 1.61 mg/dL (H)). Liver Function Tests: Recent Labs  Lab 07/11/22 1940 07/12/22 0326 07/13/22 0028  AST 23 73* 81*  ALT '28 29 28  '$ ALKPHOS 61 55 52  BILITOT 0.8 0.6 0.5  PROT 7.1 7.1 6.6  ALBUMIN 3.1* 2.9* 2.5*  Recent Labs  Lab 07/11/22 1940  LIPASE 43    No results for input(s): "AMMONIA" in the last 168 hours. Coagulation Profile: No results for input(s): "INR", "PROTIME" in the last 168 hours. Cardiac Enzymes: No results for input(s): "CKTOTAL", "CKMB", "CKMBINDEX", "TROPONINI" in the last 168 hours. BNP (last 3 results) No results for input(s): "PROBNP" in the last 8760 hours. HbA1C: No results for input(s): "HGBA1C" in the last 72 hours. CBG: Recent Labs  Lab 07/13/22 2001 07/13/22 2140 07/14/22 0023 07/14/22 0409 07/14/22 0736  GLUCAP 161* 132* 110* 125* 195*    Lipid Profile: No results for input(s): "CHOL", "HDL", "LDLCALC", "TRIG", "CHOLHDL", "LDLDIRECT" in the last 72 hours. Thyroid Function Tests: Recent Labs    07/12/22 0326  TSH 0.673    Anemia Panel: No results for input(s): "VITAMINB12", "FOLATE", "FERRITIN", "TIBC", "IRON", "RETICCTPCT" in the last 72 hours. Sepsis Labs: Recent Labs  Lab 07/11/22 1940  LATICACIDVEN 1.8     Recent Results (from the  past 240 hour(s))  Culture, blood (routine x 2)     Status: Abnormal (Preliminary result)   Collection Time: 07/11/22  7:52 PM   Specimen: BLOOD LEFT FOREARM  Result Value Ref Range Status   Specimen Description BLOOD LEFT FOREARM  Final   Special Requests   Final    BOTTLES DRAWN AEROBIC AND ANAEROBIC Blood Culture adequate volume   Culture  Setup Time   Final    GRAM NEGATIVE RODS IN BOTH AEROBIC AND ANAEROBIC BOTTLES CRITICAL RESULT CALLED TO, READ BACK BY AND VERIFIED WITH: PHARMD HALLEY V. [id number] FCP    Culture (A)  Final    ESCHERICHIA COLI SUSCEPTIBILITIES TO FOLLOW Performed at [facility], [place] [address], [place], [place] [address]    Report Status PENDING  Incomplete  Blood Culture ID Panel (Reflexed)     Status: Abnormal   Collection Time: 07/11/22  7:52 PM  Result Value Ref Range Status   Enterococcus faecalis NOT DETECTED NOT DETECTED Final   Enterococcus Faecium NOT DETECTED NOT DETECTED Final   Listeria monocytogenes NOT DETECTED NOT DETECTED Final   Staphylococcus species NOT DETECTED NOT DETECTED Final   Staphylococcus aureus (BCID) NOT DETECTED NOT DETECTED Final   Staphylococcus epidermidis NOT DETECTED NOT DETECTED Final   Staphylococcus lugdunensis NOT DETECTED NOT DETECTED Final   Streptococcus species NOT DETECTED NOT DETECTED Final   Streptococcus agalactiae NOT DETECTED NOT DETECTED Final   Streptococcus pneumoniae NOT DETECTED NOT DETECTED Final   Streptococcus pyogenes NOT DETECTED NOT DETECTED Final   A.calcoaceticus-baumannii NOT DETECTED NOT DETECTED Final   Bacteroides fragilis NOT DETECTED NOT DETECTED Final   Enterobacterales DETECTED (A) NOT DETECTED Final    Comment: Enterobacterales represent a large order of gram negative bacteria, not a single organism. CRITICAL RESULT CALLED TO, READ BACK BY AND VERIFIED WITH: PHARMD HALLEY V. 1023 [id number] FCP    Enterobacter cloacae complex NOT DETECTED NOT DETECTED Final   Escherichia coli  DETECTED (A) NOT DETECTED Final    Comment: CRITICAL RESULT CALLED TO, READ BACK BY AND VERIFIED WITH: PHARMD HALLEY V. 1023 [id number] FCP    Klebsiella aerogenes NOT DETECTED NOT DETECTED Final   Klebsiella oxytoca NOT DETECTED NOT DETECTED Final   Klebsiella pneumoniae NOT DETECTED NOT DETECTED Final   Proteus species NOT DETECTED NOT DETECTED Final   Salmonella species NOT DETECTED NOT DETECTED Final   Serratia marcescens NOT DETECTED NOT DETECTED Final   Haemophilus influenzae NOT DETECTED NOT DETECTED Final   Neisseria  meningitidis NOT DETECTED NOT DETECTED Final   Pseudomonas aeruginosa NOT DETECTED NOT DETECTED Final   Stenotrophomonas maltophilia NOT DETECTED NOT DETECTED Final   Candida albicans NOT DETECTED NOT DETECTED Final   Candida auris NOT DETECTED NOT DETECTED Final   Candida glabrata NOT DETECTED NOT DETECTED Final   Candida krusei NOT DETECTED NOT DETECTED Final   Candida parapsilosis NOT DETECTED NOT DETECTED Final   Candida tropicalis NOT DETECTED NOT DETECTED Final   Cryptococcus neoformans/gattii NOT DETECTED NOT DETECTED Final   CTX-M ESBL NOT DETECTED NOT DETECTED Final   Carbapenem resistance IMP NOT DETECTED NOT DETECTED Final   Carbapenem resistance KPC NOT DETECTED NOT DETECTED Final   Carbapenem resistance NDM NOT DETECTED NOT DETECTED Final   Carbapenem resist OXA 48 LIKE NOT DETECTED NOT DETECTED Final   Carbapenem resistance VIM NOT DETECTED NOT DETECTED Final    Comment: Performed at [facility], [address] [place], [place] [address]  Resp panel by RT-PCR (RSV, Flu A&B, Covid) Anterior Nasal Swab     Status: None   Collection Time: 07/11/22  8:02 PM   Specimen: Anterior Nasal Swab  Result Value Ref Range Status   SARS Coronavirus 2 by RT PCR NEGATIVE NEGATIVE Final   Influenza A by PCR NEGATIVE NEGATIVE Final   Influenza B by PCR NEGATIVE NEGATIVE Final    Comment: (NOTE) The Xpert Xpress SARS-CoV-2/FLU/RSV plus assay is intended as an  aid in the diagnosis of influenza from Nasopharyngeal swab specimens and should not be used as a sole basis for treatment. Nasal washings and aspirates are unacceptable for Xpert Xpress SARS-CoV-2/FLU/RSV testing.  Fact Sheet for Patients: EntrepreneurPulse.com.au  Fact Sheet for Healthcare Providers: IncredibleEmployment.be  This test is not yet approved or cleared by the [place] FDA and has been authorized for detection and/or diagnosis of SARS-CoV-2 by FDA under an Emergency Use Authorization (EUA). This EUA will remain in effect (meaning this test can be used) for the duration of the COVID-19 declaration under Section 564(b)(1) of the Act, 21 U.S.C. section 360bbb-3(b)(1), unless the authorization is terminated or revoked.     Resp Syncytial Virus by PCR NEGATIVE NEGATIVE Final    Comment: (NOTE) Fact Sheet for Patients: EntrepreneurPulse.com.au  Fact Sheet for Healthcare Providers: IncredibleEmployment.be  This test is not yet approved or cleared by the [place] FDA and has been authorized for detection and/or diagnosis of SARS-CoV-2 by FDA under an Emergency Use Authorization (EUA). This EUA will remain in effect (meaning this test can be used) for the duration of the COVID-19 declaration under Section 564(b)(1) of the Act, 21 U.S.C. section 360bbb-3(b)(1), unless the authorization is terminated or revoked.  Performed at [facility], [place] [address], [place], [place] [address]   Culture, blood (routine x 2)     Status: None (Preliminary result)   Collection Time: 07/11/22  8:20 PM   Specimen: BLOOD RIGHT FOREARM  Result Value Ref Range Status   Specimen Description BLOOD RIGHT FOREARM  Final   Special Requests   Final    BOTTLES DRAWN AEROBIC AND ANAEROBIC Blood Culture adequate volume   Culture  Setup Time GRAM NEGATIVE RODS ANAEROBIC BOTTLE ONLY   Final   Culture   Final     GRAM NEGATIVE RODS IDENTIFICATION TO FOLLOW Performed at [facility], [place] [address] [place], [place] [address]    Report Status PENDING  Incomplete  Urine Culture (for pregnant, neutropenic or urologic patients or patients with an  indwelling urinary catheter)     Status: Abnormal (Preliminary result)   Collection Time: 07/12/22  3:04 AM   Specimen: Urine, Clean Catch  Result Value Ref Range Status   Specimen Description URINE, CLEAN CATCH  Final   Special Requests NONE  Final   Culture (A)  Final    >=100,000 COLONIES/mL ESCHERICHIA COLI SUSCEPTIBILITIES TO FOLLOW Performed at [facility], [address] [place], [place] [address]    Report Status PENDING  Incomplete  MRSA Next Gen by PCR, Nasal     Status: None   Collection Time: 07/12/22  4:40 AM   Specimen: Nasal Mucosa; Nasal Swab  Result Value Ref Range Status   MRSA by PCR Next Gen NOT DETECTED NOT DETECTED Final    Comment: (NOTE) The GeneXpert MRSA Assay (FDA approved for NASAL specimens only), is one component of a comprehensive MRSA colonization surveillance program. It is not intended to diagnose MRSA infection nor to guide or monitor treatment for MRSA infections. Test performance is not FDA approved in patients less than 69 years old. Performed at [facility], [place] [address], [place], [place] [address]       Radiology Studies: ECHOCARDIOGRAM COMPLETE  Result Date: 07/12/2022    ECHOCARDIOGRAM REPORT   Patient Name:   SISTO INGRUM Date of Exam: 07/12/2022 Medical Rec #:  [id number]           Height:       68.0 in Accession #:    [id number]          Weight:       204.1 lb Date of Birth:  May 24, 1945          BSA:          2.062 m Patient Age:    22 years            BP:           129/72 mmHg Patient Gender: M                   HR:           90 bpm. Exam Location:  Inpatient Procedure: 2D Echo, Cardiac Doppler, Color Doppler and Intracardiac            Opacification Agent Indications:     NSTEMI I21.4  History:        Patient has prior history of Echocardiogram examinations, most                 recent 04/28/2022. CHF, CAD; Arrythmias:Atrial Fibrillation.  Sonographer:    Luane School RDCS Referring Phys: [id number] [facility] A THOM[facility]  Sonographer Comments: Suboptimal apical window and suboptimal subcostal window. IMPRESSIONS  1. Left ventricular ejection fraction, by estimation, is 30 to 35%. The left ventricle has moderately decreased function. The left ventricle demonstrates regional wall motion abnormalities (see scoring diagram/findings for description). There is mild left ventricular hypertrophy. Left ventricular diastolic parameters are consistent with Grade II diastolic dysfunction (pseudonormalization). Elevated left atrial pressure.  2. Right ventricular systolic function is normal. The right ventricular size is mildly enlarged. There is normal pulmonary artery systolic pressure. The estimated right ventricular systolic pressure is [id number] mmHg.  3. Left atrial size was mildly dilated.  4. The mitral valve is normal in structure. Mild mitral valve regurgitation.  5. The inferior vena cava is normal in size with greater than 50% respiratory variability, suggesting right atrial pressure of 3 mmHg.  6. The aortic valve  is calcified. Aortic valve regurgitation is not visualized. Severe aortic valve stenosis. Moderate AS by gradients (MG 25 mmHg, Vmax 3.2 m/s), severe by AVA (0.9cm^2) and DI (0.25). Low SV index (26 cc/m^2), suspect low flow low gradient severe AS FINDINGS  Left Ventricle: Left ventricular ejection fraction, by estimation, is 30 to 35%. The left ventricle has moderately decreased function. The left ventricle demonstrates regional wall motion abnormalities. Definity contrast agent was given IV to delineate the left ventricular endocardial borders. The left ventricular internal cavity size was normal in size. There is mild left ventricular hypertrophy. Left ventricular diastolic  parameters are consistent with Grade II diastolic dysfunction (pseudonormalization). Elevated left atrial pressure.  LV Wall Scoring: The mid and distal anterior septum, mid and distal inferior wall, mid inferoseptal segment, apical anterior segment, and apex are akinetic. The anterior wall, entire lateral wall, basal anteroseptal segment, basal inferior segment, and basal inferoseptal segment are normal. Right Ventricle: The right ventricular size is mildly enlarged. No increase in right ventricular wall thickness. Right ventricular systolic function is normal. There is normal pulmonary artery systolic pressure. The tricuspid regurgitant velocity is 2.72  m/s, and with an assumed right atrial pressure of 3 mmHg, the estimated right ventricular systolic pressure is [id number] mmHg. Left Atrium: Left atrial size was mildly dilated. Right Atrium: Right atrial size was normal in size. Pericardium: There is no evidence of pericardial effusion. Mitral Valve: The mitral valve is normal in structure. Mild mitral valve regurgitation. Tricuspid Valve: The tricuspid valve is normal in structure. Tricuspid valve regurgitation is trivial. Aortic Valve: The aortic valve is calcified. Aortic valve regurgitation is not visualized. Severe aortic stenosis is present. Aortic valve mean gradient measures 25.0 mmHg. Aortic valve peak gradient measures 42.0 mmHg. Aortic valve area, by VTI measures  0.87 cm. Pulmonic Valve: The pulmonic valve was not well visualized. Pulmonic valve regurgitation is trivial. Aorta: The aortic root and ascending aorta are structurally normal, with no evidence of dilitation. Venous: The inferior vena cava is normal in size with greater than 50% respiratory variability, suggesting right atrial pressure of 3 mmHg. IAS/Shunts: The interatrial septum was not well visualized.  LEFT VENTRICLE PLAX 2D LVIDd:         5.50 cm      Diastology LVIDs:         4.00 cm      LV e' medial:    5.98 cm/s LV PW:         1.40 cm       LV E/e' medial:  20.9 LV IVS:        1.40 cm      LV e' lateral:   8.81 cm/s LVOT diam:     2.10 cm      LV E/e' lateral: 14.2 LV SV:         54 LV SV Index:   26 LVOT Area:     3.46 cm  LV Volumes (MOD) LV vol d, MOD A2C: 129.0 ml LV vol d, MOD A4C: 116.0 ml LV vol s, MOD A2C: 81.8 ml LV vol s, MOD A4C: 80.9 ml LV SV MOD A2C:     47.2 ml LV SV MOD A4C:     116.0 ml LV SV MOD BP:      45.2 ml RIGHT VENTRICLE             IVC RV S prime:     16.20 cm/s  IVC diam: 1.90 cm TAPSE (M-mode): 2.4 cm LEFT ATRIUM  Index        RIGHT ATRIUM           Index LA diam:        4.90 cm 2.38 cm/m   RA Area:     22.20 cm LA Vol (A2C):   71.9 ml 34.88 ml/m  RA Volume:   60.70 ml  29.44 ml/m LA Vol (A4C):   83.2 ml 40.36 ml/m LA Biplane Vol: 78.1 ml 37.88 ml/m  AORTIC VALVE AV Area (Vmax):    0.83 cm AV Area (Vmean):   0.79 cm AV Area (VTI):     0.87 cm AV Vmax:           324.00 cm/s AV Vmean:          234.000 cm/s AV VTI:            0.627 m AV Peak Grad:      42.0 mmHg AV Mean Grad:      25.0 mmHg LVOT Vmax:         77.50 cm/s LVOT Vmean:        53.600 cm/s LVOT VTI:          0.157 m LVOT/AV VTI ratio: 0.25  AORTA Ao Root diam: 2.90 cm Ao Asc diam:  3.70 cm MITRAL VALVE                TRICUSPID VALVE MV Area (PHT): 5.34 cm     TR Peak grad:   29.6 mmHg MV Decel Time: 142 msec     TR Vmax:        272.00 cm/s MV E velocity: 125.00 cm/s MV A velocity: 97.90 cm/s   SHUNTS MV E/A ratio:  1.28         Systemic VTI:  0.16 m                             Systemic Diam: 2.10 cm Oswaldo Milian MD Electronically signed by Oswaldo Milian MD Signature Date/Time: 07/12/2022/1:08:08 PM    Final     Scheduled Meds:  atorvastatin  80 mg Oral q1800   Chlorhexidine Gluconate Cloth  6 each Topical Daily   insulin aspart  0-6 Units Subcutaneous Q4H   insulin detemir  10 Units Subcutaneous Daily   metoprolol succinate  25 mg Oral Daily   tamsulosin  0.4 mg Oral Daily   Continuous Infusions:  cefTRIAXone (ROCEPHIN)   IV 2 g (07/13/22 2024)   heparin 1,200 Units/hr (07/13/22 2325)     LOS: 2 days    Flora Lipps, MD [facility]  If 7PM-7AM, please contact night-coverage [email or web address] 07/14/2022, 7:50 AM

## 2022-07-14 NOTE — Progress Notes (Signed)
Progress Note  Patient Name: Jose Beck Date of Encounter: 07/14/2022  Primary Cardiologist: Peter Martinique, MD   Subjective   Patient seen examined his bedside.  His wife is by the bedside.  Offers no complaints at this time.  Denies chest pain or shortness of breath.  Inpatient Medications    Scheduled Meds:  atorvastatin  80 mg Oral q1800   Chlorhexidine Gluconate Cloth  6 each Topical Daily   insulin aspart  0-6 Units Subcutaneous Q4H   insulin detemir  10 Units Subcutaneous Daily   metoprolol succinate  25 mg Oral Daily   tamsulosin  0.4 mg Oral Daily   Continuous Infusions:  cefTRIAXone (ROCEPHIN)  IV 2 g (07/13/22 2024)   heparin 1,200 Units/hr (07/13/22 2325)   PRN Meds: acetaminophen **OR** acetaminophen, albuterol, nitroGLYCERIN, ondansetron **OR** ondansetron (ZOFRAN) IV   Vital Signs    Vitals:   07/13/22 1959 07/14/22 0023 07/14/22 0406 07/14/22 0730  BP: 110/68 117/66 117/67 108/62  Pulse: 72 72 71 76  Resp: 20 (!) 23 (!) 23 20  Temp: 98.6 F (37 C) 99.3 F (37.4 C) 97.6 F (36.4 C) 98.8 F (37.1 C)  TempSrc: Oral Oral Oral   SpO2: 96% 96% 96% 96%  Weight:   89.1 kg   Height:        Intake/Output Summary (Last 24 hours) at 07/14/2022 0921 Last data filed at 07/14/2022 0734 Gross per 24 hour  Intake 1079.96 ml  Output 2250 ml  Net -1170.04 ml   Filed Weights   07/12/22 0451 07/13/22 0500 07/14/22 0406  Weight: 92.6 kg 89.1 kg 89.1 kg    Telemetry    Sinus rhythm- Personally Reviewed  ECG    None today- Personally Reviewed  Physical Exam    General: Comfortable, lying in bed Head: Atraumatic, normal size  Eyes: PEERLA, EOMI  Neck: Supple, normal JVD Cardiac: Normal S1, S2; RRR; 3 out of 6 mid peaking systolic ejection murmur, rubs, or gallops Lungs: Clear to auscultation bilaterally Abd: Soft, nontender, no hepatomegaly  Ext: warm, no edema Musculoskeletal: No deformities, BUE and BLE strength normal and equal Skin: Warm  and dry, no rashes   Neuro: Alert and oriented to person, place, time, and situation, CNII-XII grossly intact, no focal deficits  Psych: Normal mood and affect   Labs    Chemistry Recent Labs  Lab 07/11/22 1940 07/12/22 0326 07/13/22 0028 07/14/22 0020  NA 141 139 138 135  K 3.7 3.8 3.4* 4.2  CL 103 103 104 102  CO2 '22 23 23 23  '$ GLUCOSE 173* 165* 144* 119*  BUN 38* 37* 31* 33*  CREATININE 1.80* 1.77* 1.67* 1.61*  CALCIUM 8.9 8.7* 8.3* 8.0*  PROT 7.1 7.1 6.6  --   ALBUMIN 3.1* 2.9* 2.5*  --   AST 23 73* 81*  --   ALT '28 29 28  '$ --   ALKPHOS 61 55 52  --   BILITOT 0.8 0.6 0.5  --   GFRNONAA 39* 39* 42* 44*  ANIONGAP 16* '13 11 10     '$ Hematology Recent Labs  Lab 07/12/22 0554 07/13/22 0027 07/14/22 0020  WBC 10.1 8.9 6.1  RBC 3.04* 2.95* 2.83*  HGB 9.7* 9.2* 9.0*  HCT 28.9* 28.3* 26.9*  MCV 95.1 95.9 95.1  MCH 31.9 31.2 31.8  MCHC 33.6 32.5 33.5  RDW 14.2 14.0 14.1  PLT 106* 98* 103*    Cardiac EnzymesNo results for input(s): "TROPONINI" in the last 168 hours. No results for  input(s): "TROPIPOC" in the last 168 hours.   BNP Recent Labs  Lab 07/12/22 0326  BNP 607.7*     DDimer No results for input(s): "DDIMER" in the last 168 hours.   Radiology    ECHOCARDIOGRAM COMPLETE  Result Date: 07/12/2022    ECHOCARDIOGRAM REPORT   Patient Name:   Jose Beck Date of Exam: 07/12/2022 Medical Rec #:  [id number]           Height:       68.0 in Accession #:    [id number]          Weight:       204.1 lb Date of Birth:  05/04/1946          BSA:          2.062 m Patient Age:    58 years            BP:           129/72 mmHg Patient Gender: M                   HR:           90 bpm. Exam Location:  Inpatient Procedure: 2D Echo, Cardiac Doppler, Color Doppler and Intracardiac            Opacification Agent Indications:    NSTEMI I21.4  History:        Patient has prior history of Echocardiogram examinations, most                 recent 04/28/2022. CHF, CAD;  Arrythmias:Atrial Fibrillation.  Sonographer:    Luane School RDCS Referring Phys: [id number] [facility] A THOMAS  Sonographer Comments: Suboptimal apical window and suboptimal subcostal window. IMPRESSIONS  1. Left ventricular ejection fraction, by estimation, is 30 to 35%. The left ventricle has moderately decreased function. The left ventricle demonstrates regional wall motion abnormalities (see scoring diagram/findings for description). There is mild left ventricular hypertrophy. Left ventricular diastolic parameters are consistent with Grade II diastolic dysfunction (pseudonormalization). Elevated left atrial pressure.  2. Right ventricular systolic function is normal. The right ventricular size is mildly enlarged. There is normal pulmonary artery systolic pressure. The estimated right ventricular systolic pressure is [id number] mmHg.  3. Left atrial size was mildly dilated.  4. The mitral valve is normal in structure. Mild mitral valve regurgitation.  5. The inferior vena cava is normal in size with greater than 50% respiratory variability, suggesting right atrial pressure of 3 mmHg.  6. The aortic valve is calcified. Aortic valve regurgitation is not visualized. Severe aortic valve stenosis. Moderate AS by gradients (MG 25 mmHg, Vmax 3.2 m/s), severe by AVA (0.9cm^2) and DI (0.25). Low SV index (26 cc/m^2), suspect low flow low gradient severe AS FINDINGS  Left Ventricle: Left ventricular ejection fraction, by estimation, is 30 to 35%. The left ventricle has moderately decreased function. The left ventricle demonstrates regional wall motion abnormalities. Definity contrast agent was given IV to delineate the left ventricular endocardial borders. The left ventricular internal cavity size was normal in size. There is mild left ventricular hypertrophy. Left ventricular diastolic parameters are consistent with Grade II diastolic dysfunction (pseudonormalization). Elevated left atrial pressure.  LV Wall Scoring: The mid and  distal anterior septum, mid and distal inferior wall, mid inferoseptal segment, apical anterior segment, and apex are akinetic. The anterior wall, entire lateral wall, basal anteroseptal segment, basal inferior segment, and basal inferoseptal segment are normal. Right Ventricle: The right ventricular size is  mildly enlarged. No increase in right ventricular wall thickness. Right ventricular systolic function is normal. There is normal pulmonary artery systolic pressure. The tricuspid regurgitant velocity is 2.72  m/s, and with an assumed right atrial pressure of 3 mmHg, the estimated right ventricular systolic pressure is [id number] mmHg. Left Atrium: Left atrial size was mildly dilated. Right Atrium: Right atrial size was normal in size. Pericardium: There is no evidence of pericardial effusion. Mitral Valve: The mitral valve is normal in structure. Mild mitral valve regurgitation. Tricuspid Valve: The tricuspid valve is normal in structure. Tricuspid valve regurgitation is trivial. Aortic Valve: The aortic valve is calcified. Aortic valve regurgitation is not visualized. Severe aortic stenosis is present. Aortic valve mean gradient measures 25.0 mmHg. Aortic valve peak gradient measures 42.0 mmHg. Aortic valve area, by VTI measures  0.87 cm. Pulmonic Valve: The pulmonic valve was not well visualized. Pulmonic valve regurgitation is trivial. Aorta: The aortic root and ascending aorta are structurally normal, with no evidence of dilitation. Venous: The inferior vena cava is normal in size with greater than 50% respiratory variability, suggesting right atrial pressure of 3 mmHg. IAS/Shunts: The interatrial septum was not well visualized.  LEFT VENTRICLE PLAX 2D LVIDd:         5.50 cm      Diastology LVIDs:         4.00 cm      LV e' medial:    5.98 cm/s LV PW:         1.40 cm      LV E/e' medial:  20.9 LV IVS:        1.40 cm      LV e' lateral:   8.81 cm/s LVOT diam:     2.10 cm      LV E/e' lateral: 14.2 LV SV:          54 LV SV Index:   26 LVOT Area:     3.46 cm  LV Volumes (MOD) LV vol d, MOD A2C: 129.0 ml LV vol d, MOD A4C: 116.0 ml LV vol s, MOD A2C: 81.8 ml LV vol s, MOD A4C: 80.9 ml LV SV MOD A2C:     47.2 ml LV SV MOD A4C:     116.0 ml LV SV MOD BP:      45.2 ml RIGHT VENTRICLE             IVC RV S prime:     16.20 cm/s  IVC diam: 1.90 cm TAPSE (M-mode): 2.4 cm LEFT ATRIUM             Index        RIGHT ATRIUM           Index LA diam:        4.90 cm 2.38 cm/m   RA Area:     22.20 cm LA Vol (A2C):   71.9 ml 34.88 ml/m  RA Volume:   60.70 ml  29.44 ml/m LA Vol (A4C):   83.2 ml 40.36 ml/m LA Biplane Vol: 78.1 ml 37.88 ml/m  AORTIC VALVE AV Area (Vmax):    0.83 cm AV Area (Vmean):   0.79 cm AV Area (VTI):     0.87 cm AV Vmax:           324.00 cm/s AV Vmean:          234.000 cm/s AV VTI:            0.627 m AV Peak Grad:  42.0 mmHg AV Mean Grad:      25.0 mmHg LVOT Vmax:         77.50 cm/s LVOT Vmean:        53.600 cm/s LVOT VTI:          0.157 m LVOT/AV VTI ratio: 0.25  AORTA Ao Root diam: 2.90 cm Ao Asc diam:  3.70 cm MITRAL VALVE                TRICUSPID VALVE MV Area (PHT): 5.34 cm     TR Peak grad:   29.6 mmHg MV Decel Time: 142 msec     TR Vmax:        272.00 cm/s MV E velocity: 125.00 cm/s MV A velocity: 97.90 cm/s   SHUNTS MV E/A ratio:  1.28         Systemic VTI:  0.16 m                             Systemic Diam: 2.10 cm Oswaldo Milian MD Electronically signed by Oswaldo Milian MD Signature Date/Time: 07/12/2022/1:08:08 PM    Final     Cardiac Studies   Echocardiogram 07/12/2022   1. Left ventricular ejection fraction, by estimation, is 30 to 35%. The left ventricle has moderately decreased function. The left ventricle demonstrates regional wall motion abnormalities (see scoring diagram/findings for description). There is mild left ventricular hypertrophy. Left ventricular diastolic parameters are consistent with Grade II diastolic dysfunction (pseudonormalization). Elevated left atrial  pressure.   2. Right ventricular systolic function is normal. The right ventricular size is mildly enlarged. There is normal pulmonary artery systolic pressure. The estimated right ventricular systolic pressure is [id number] mmHg.   3. Left atrial size was mildly dilated.   4. The mitral valve is normal in structure. Mild mitral valve regurgitation.   5. The inferior vena cava is normal in size with greater than 50% respiratory variability, suggesting right atrial pressure of 3 mmHg.   6. The aortic valve is calcified. Aortic valve regurgitation is not visualized. Severe aortic valve stenosis. Moderate AS by gradients (MG 25 mmHg, Vmax 3.2 m/s), severe by AVA (0.9cm^2) and DI (0.25). Low SV index (26 cc/m^2), suspect low flow low gradient severe AS FINDINGS   Patient Profile     77 y.o. male with severe multivessel CAD (known chronic occlusions of the RCA and LAD), recent myocardial infarction, depressed left ventricular systolic function, aortic stenosis, metastatic prostate cancer, AAA status post EVAR with evidence of endoleak and severe aneurysm enlargement, previous stroke with residual mild dysphasia, paroxysmal atrial fibrillation, history of nonsustained VT, DM2, HTN, HLP presents with myocardial infarction in the setting of sepsis from E. coli bacteremia, bladder outlet obstruction and hydronephrosis.   Assessment & Plan    Coronary artery disease with demand ischemia. Heart failure with reduced ejection fraction-does not appear to be in heart failure exacerbation Nonsustained ventricular tachycardia Sepsis secondary to urinary tract infection Suspect moderate low-flow low gradient aortic stenosis Metastatic prostate cancer Atrial fibrillation-on Eliquis at home currently on heparin drip due to type II MI.   Currently he is on heparin drip in the setting of his recent type II myocardial infarction likely in the setting of sepsis as well as demand ischemia due to infection.  His troponins has  trended down.  I agree with the heparin drip he has been on this for about 24 hours.  It would be beneficial for him to complete  48 hours.  Which will end tomorrow morning and we can transition him to the Eliquis for his atrial fibrillation.  Unless if clinical picture changes I do not see any benefit for repeat angiographic at this time as he does have chronically continue his medical therapy which include heparin drip, atorvastatin 80 mg daily, metoprolol succinate 25 mg daily.  In terms of his chronic heart failure with reduced ejection fraction unfortunately he has not been able to tolerate guideline directed medical therapy he did have worsening renal failure with ARB/ARNI and is not a good candidate for SGLT2 inhibitors due to bladder outlet obstruction.  He is on beta-blockers and we will monitor the patient on this.  Telemetry does not show any occurrence of NSVT.  Will continue to monitor.  Please continue to replete his electrolytes.  Sepsis-he is on antibiotic by the primary team.  His echocardiogram does show evidence of low-flow low gradient moderate aortic stenosis.  Will continue to monitor the patient.  No plans for pursuing intervention as there are multiple factors that may be barrier here to have successful TAVR and he is definite not a surgical candidate.  He is currently in sinus rhythm from atrial fibrillation-continue his beta-blocker.  He is on heparin drip.  Plan to transition to Eliquis tomorrow morning.      For questions or updates, please contact [place] Please consult [email or web address] for contact info under Cardiology/STEMI.      Signed, Berniece Salines, DO  07/14/2022, 9:21 AM

## 2022-07-14 NOTE — Progress Notes (Signed)
ANTICOAGULATION CONSULT NOTE  Pharmacy Consult for heparin Indication:  Afib and elevated troponin  Allergies  Allergen Reactions   Cheese Nausea And Vomiting   Pravastatin Anxiety    Anxiety attack    Patient Measurements: Height: '5\' 8"'$  (172.7 cm) Weight: 89.1 kg (196 lb 6.9 oz) IBW/kg (Calculated) : 68.4 Heparin Dosing Weight: 90kg  Vital Signs: Temp: 98.8 F (37.1 C) (03/04 0730) Temp Source: Oral (03/04 0406) BP: 108/62 (03/04 0730) Pulse Rate: 76 (03/04 0730)  Labs: Recent Labs    07/12/22 0326 07/12/22 0554 07/12/22 0852 07/12/22 1044 07/12/22 2013 07/13/22 0027 07/13/22 0028 07/13/22 1247 07/14/22 0020  HGB 9.9* 9.7*  --   --   --  9.2*  --   --  9.0*  HCT 30.2* 28.9*  --   --   --  28.3*  --   --  26.9*  PLT 109* 106*  --   --   --  98*  --   --  103*  APTT  --   --   --  40*   < > 126*  --  85* 87*  HEPARINUNFRC  --   --   --  >1.10*  --  >1.10*  --   --  >1.10*  CREATININE 1.77*  --   --   --   --   --  1.67*  --  1.61*  TROPONINIHS 16,169* 14,490* 12,829* 13,452*  --   --   --   --   --    < > = values in this interval not displayed.     Estimated Creatinine Clearance: 42.3 mL/min (A) (by C-G formula based on SCr of 1.61 mg/dL (H)).   Medical History: Past Medical History:  Diagnosis Date   Abdominal aortic aneurysm (AAA) ([place])    Aortic stenosis    a. 11/2015 Echo: EF 55-60%, Gr1 DD, mild AS.   Chronic combined systolic and diastolic CHF (congestive heart failure) ([place])    a. 07/2015: TEE showing EF of 40% b. 11/2015: Echo w/ EF 55-60%, Grade 1 DD, mild AS.   Coronary artery disease    a. patient reports 100% stenosis of LAD and RCA by cath in 1999. b. 11/2015 Cath: LM nl, LAD 100 - fills by L->L collats from D1, LCX 60d, OM1 lalrge/nl, OM2 small/nl, RCA 16m L->R collats.EF 35-45% (55-60% by echo).   Diabetes mellitus without complication [place]    Type II   GERD (gastroesophageal reflux disease)    Hyperlipemia    Hypertension     Hypertensive heart disease    [place]    a. 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   [place]rombocytopenia ([place] 11[place]    Assessment: [id number] c/o chills, shaking, and N/V > admitted for sepsis d/t UTI with urinary retention and AKI; troponin found to be elevated > plan to transition from Eliquis PTA (last dose 3/1 0715) for Afib to heparin during w/u.  aPTT 87, therapeutic Heparin level >1.1 (not correlating with aPTT in setting of recent apixaban administration) Current heparin infusion rate: 1200 units/hr  Hgb 9, Plt 103 - stable No known issues with IV infusion.  No overt bleeding or complications noted per RN. Hematuria resolved.  Goal of Therapy:  Heparin level 0.3-0.7 units/ml aPTT 66-102 seconds Monitor platelets by anticoagulation protocol: Yes   Plan:  Continue heparin at 1200 units/hr. Monitor heparin levels, aPTT (while apixaban affects anti-Xa assay), and CBC daily. F/u cardiology plans for transition back to apixaban  Luisa Hart, PharmD, BCPS Clinical Pharmacist 07/14/2022 8:11 AM   Please refer to AMION for pharmacy phone number

## 2022-07-14 NOTE — Progress Notes (Signed)
Mobility Specialist Progress Note:   07/14/22 1100  Mobility  Activity Ambulated with assistance in hallway  Level of Assistance Standby assist, set-up cues, supervision of patient - no hands on  Assistive Device Front wheel walker  Distance Ambulated (ft) 180 ft  Activity Response Tolerated well  $Mobility charge 1 Mobility   Pt in chair willing to participate in mobility. No complaints of pain. Left in bed with call bell in reach and all needs met.   Gareth Eagle Brielyn Bosak Mobility Specialist Please contact via [employer] or  Rehab Office at [phone]

## 2022-07-15 DIAGNOSIS — R338 Other retention of urine: Secondary | ICD-10-CM | POA: Insufficient documentation

## 2022-07-15 LAB — URINE CULTURE: Culture: >=100000 — AB

## 2022-07-15 LAB — GLUCOSE, CAPILLARY
Glucose-Capillary: 116 mg/dL — ABNORMAL HIGH (ref 70–99)
Glucose-Capillary: 144 mg/dL — ABNORMAL HIGH (ref 70–99)
Glucose-Capillary: 164 mg/dL — ABNORMAL HIGH (ref 70–99)

## 2022-07-15 LAB — CBC
HCT: 28 % — ABNORMAL LOW (ref 39.0–52.0)
Hemoglobin: 9.1 g/dL — ABNORMAL LOW (ref 13.0–17.0)
MCH: 31.3 pg (ref 26.0–34.0)
MCHC: 32.5 g/dL (ref 30.0–36.0)
MCV: 96.2 fL (ref 80.0–100.0)
Platelets: 112 10*3/uL — ABNORMAL LOW (ref 150–400)
RBC: 2.91 MIL/uL — ABNORMAL LOW (ref 4.22–5.81)
RDW: 13.8 % (ref 11.5–15.5)
WBC: 5 10*3/uL (ref 4.0–10.5)
nRBC: 0 % (ref 0.0–0.2)

## 2022-07-15 LAB — BASIC METABOLIC PANEL
Anion gap: 6 (ref 5–15)
BUN: 30 mg/dL — ABNORMAL HIGH (ref 8–23)
CO2: 27 mmol/L (ref 22–32)
Calcium: 8 mg/dL — ABNORMAL LOW (ref 8.9–10.3)
Chloride: 103 mmol/L (ref 98–111)
Creatinine, Ser: 1.4 mg/dL — ABNORMAL HIGH (ref 0.61–1.24)
GFR, Estimated: 52 mL/min — ABNORMAL LOW (ref >60)
Glucose, Bld: 105 mg/dL — ABNORMAL HIGH (ref 70–99)
Potassium: 3.9 mmol/L (ref 3.5–5.1)
Sodium: 136 mmol/L (ref 135–145)

## 2022-07-15 LAB — CULTURE, BLOOD (ROUTINE X 2)
Special Requests: ADEQUATE
Special Requests: ADEQUATE

## 2022-07-15 LAB — APTT: aPTT: 94 seconds — ABNORMAL HIGH (ref 24–36)

## 2022-07-15 LAB — MAGNESIUM: Magnesium: 2.2 mg/dL (ref 1.7–2.4)

## 2022-07-15 LAB — HEPARIN LEVEL (UNFRACTIONATED): Heparin Unfractionated: 0.8 IU/mL — ABNORMAL HIGH (ref 0.30–0.70)

## 2022-07-15 MED ORDER — APIXABAN 5 MG PO TABS
5.0000 mg | ORAL_TABLET | Freq: Two times a day (BID) | ORAL | Status: DC
  Administered 2022-07-15: 5 mg via ORAL
  Filled 2022-07-15: qty 1

## 2022-07-15 MED ORDER — POLYETHYLENE GLYCOL 3350 17 G PO PACK: 17.0000 g | PACK | Freq: Every day | ORAL | 0 refills | Status: DC | PRN

## 2022-07-15 MED ORDER — CIPROFLOXACIN HCL 500 MG PO TABS: 500.0000 mg | ORAL_TABLET | Freq: Two times a day (BID) | ORAL | 0 refills | Status: AC

## 2022-07-15 NOTE — Progress Notes (Signed)
Progress Note  Patient Name: Jose Beck Date of Encounter: 07/15/2022  Primary Cardiologist: Peter Martinique, MD   Subjective   Patient seen examined his bedside.  His wife is by the bedside.  Offers no complaints at this time.  Denies chest pain or shortness of breath.  Inpatient Medications    Scheduled Meds:  apixaban  5 mg Oral BID   atorvastatin  80 mg Oral q1800   Chlorhexidine Gluconate Cloth  6 each Topical Daily   insulin aspart  0-6 Units Subcutaneous Q4H   insulin detemir  10 Units Subcutaneous Daily   metoprolol succinate  25 mg Oral Daily   polyethylene glycol  17 g Oral Daily   tamsulosin  0.4 mg Oral Daily   Continuous Infusions:  cefTRIAXone (ROCEPHIN)  IV 2 g (07/14/22 2010)   heparin Stopped (07/15/22 0811)   PRN Meds: acetaminophen **OR** acetaminophen, albuterol, magnesium hydroxide, nitroGLYCERIN, ondansetron **OR** ondansetron (ZOFRAN) IV, sodium phosphate   Vital Signs    Vitals:   07/14/22 1946 07/14/22 2345 07/15/22 0225 07/15/22 0730  BP: 111/64 115/70 112/84 126/67  Pulse: 72  73 75  Resp: 20 (!) 22 (!) 21 19  Temp: 98.8 F (37.1 C) 98.6 F (37 C) 98.5 F (36.9 C) 98.4 F (36.9 C)  TempSrc: Oral Oral Oral Oral  SpO2: 97%  96% 96%  Weight:   89 kg   Height:        Intake/Output Summary (Last 24 hours) at 07/15/2022 0859 Last data filed at 07/15/2022 [id number] Gross per 24 hour  Intake 968.59 ml  Output 2200 ml  Net -1231.41 ml   Filed Weights   07/13/22 0500 07/14/22 0406 07/15/22 0225  Weight: 89.1 kg 89.1 kg 89 kg    Telemetry    Sinus rhythm- Personally Reviewed  ECG    None today- Personally Reviewed  Physical Exam    General: Comfortable, lying in bed Head: Atraumatic, normal size  Eyes: PEERLA, EOMI  Neck: Supple, normal JVD Cardiac: Normal S1, S2; RRR; 3 out of 6 mid peaking systolic ejection murmur, rubs, or gallops Lungs: Clear to auscultation bilaterally Abd: Soft, nontender, no hepatomegaly  Ext: warm, no  edema Musculoskeletal: No deformities, BUE and BLE strength normal and equal Skin: Warm and dry, no rashes   Neuro: Alert and oriented to person, place, time, and situation, CNII-XII grossly intact, no focal deficits  Psych: Normal mood and affect   Labs    Chemistry Recent Labs  Lab 07/11/22 1940 07/12/22 0326 07/13/22 0028 07/14/22 0020 07/15/22 0017  NA 141 139 138 135 136  K 3.7 3.8 3.4* 4.2 3.9  CL 103 103 104 102 103  CO2 '22 23 23 23 27  '$ GLUCOSE 173* 165* 144* 119* 105*  BUN 38* 37* 31* 33* 30*  CREATININE 1.80* 1.77* 1.67* 1.61* 1.40*  CALCIUM 8.9 8.7* 8.3* 8.0* 8.0*  PROT 7.1 7.1 6.6  --   --   ALBUMIN 3.1* 2.9* 2.5*  --   --   AST 23 73* 81*  --   --   ALT '28 29 28  '$ --   --   ALKPHOS 61 55 52  --   --   BILITOT 0.8 0.6 0.5  --   --   GFRNONAA 39* 39* 42* 44* 52*  ANIONGAP 16* '13 11 10 6     '$ Hematology Recent Labs  Lab 07/13/22 0027 07/14/22 0020 07/15/22 0017  WBC 8.9 6.1 5.0  RBC 2.95* 2.83* 2.91*  HGB  9.2* 9.0* 9.1*  HCT 28.3* 26.9* 28.0*  MCV 95.9 95.1 96.2  MCH 31.2 31.8 31.3  MCHC 32.5 33.5 32.5  RDW 14.0 14.1 13.8  PLT 98* 103* 112*    Cardiac EnzymesNo results for input(s): "TROPONINI" in the last 168 hours. No results for input(s): "TROPIPOC" in the last 168 hours.   BNP Recent Labs  Lab 07/12/22 0326  BNP 607.7*     DDimer No results for input(s): "DDIMER" in the last 168 hours.   Radiology    No results found.  Cardiac Studies   Echocardiogram 07/12/2022   1. Left ventricular ejection fraction, by estimation, is 30 to 35%. The left ventricle has moderately decreased function. The left ventricle demonstrates regional wall motion abnormalities (see scoring diagram/findings for description). There is mild left ventricular hypertrophy. Left ventricular diastolic parameters are consistent with Grade II diastolic dysfunction (pseudonormalization). Elevated left atrial pressure.   2. Right ventricular systolic function is normal. The  right ventricular size is mildly enlarged. There is normal pulmonary artery systolic pressure. The estimated right ventricular systolic pressure is [id number] mmHg.   3. Left atrial size was mildly dilated.   4. The mitral valve is normal in structure. Mild mitral valve regurgitation.   5. The inferior vena cava is normal in size with greater than 50% respiratory variability, suggesting right atrial pressure of 3 mmHg.   6. The aortic valve is calcified. Aortic valve regurgitation is not visualized. Severe aortic valve stenosis. Moderate AS by gradients (MG 25 mmHg, Vmax 3.2 m/s), severe by AVA (0.9cm^2) and DI (0.25). Low SV index (26 cc/m^2), suspect low flow low gradient severe AS FINDINGS   Patient Profile     77 y.o. male with severe multivessel CAD (known chronic occlusions of the RCA and LAD), recent myocardial infarction, depressed left ventricular systolic function, aortic stenosis, metastatic prostate cancer, AAA status post EVAR with evidence of endoleak and severe aneurysm enlargement, previous stroke with residual mild dysphasia, paroxysmal atrial fibrillation, history of nonsustained VT, DM2, HTN, HLP presents with myocardial infarction in the setting of sepsis from E. coli bacteremia, bladder outlet obstruction and hydronephrosis.   Assessment & Plan    Coronary artery disease with demand ischemia. Heart failure with reduced ejection fraction-does not appear to be in heart failure exacerbation Nonsustained ventricular tachycardia Sepsis secondary to urinary tract infection Suspect moderate low-flow low gradient aortic stenosis Metastatic prostate cancer Atrial fibrillation-on Eliquis at home currently on heparin drip due to type II MI.   From a cardiovascular standpoint.  He has completed 48 hours of heparin. Transition to Eliquis for his atrial fibrillation. No angina symptoms at this time.  No plans for any further ischemic evaluation.  Please continue his medical therapy which  includes atorvastatin, metoprolol succinate 25 mg daily.   In terms of his chronic heart failure with reduced ejection fraction he is euvolemic unfortunately he has not been able to tolerate guideline directed medical therapy he did have worsening renal failure with ARB/ARNI and is not a good candidate for SGLT2 inhibitors due to bladder outlet obstruction.  He is on beta-blockers and we will monitor the patient on this.  Please continue to replete his electrolytes.  Sepsis-he is on antibiotic by the primary team.  His echocardiogram does show evidence of low-flow low gradient moderate aortic stenosis.  Will continue to monitor the patient.  No plans for pursuing intervention as there are multiple factors that may be barrier here to have successful TAVR and he is definite not  a surgical candidate.  He is currently in sinus rhythm from atrial fibrillation-continue his beta-blocker.  On his Eliquis.      For questions or updates, please contact [place] Please consult [email or web address] for contact info under Cardiology/STEMI.      Signed, Berniece Salines, DO  07/15/2022, 8:59 AM

## 2022-07-15 NOTE — Progress Notes (Signed)
Discharge teaching provided to patient and wife, [name].  Understanding of teaching including foley care and making follow up appointments stated. Belongings taken with patient.

## 2022-07-15 NOTE — TOC Transition Note (Signed)
Transition of Care [facility]) - CM/SW Discharge Note   Patient Details  Name: Jose Beck MRN: [id number] Date of Birth: 05-30-1945  [facility] CM/SW Contact:  Cyndi Bender, RN Phone Number: 07/15/2022, 11:04 AM   Clinical Narrative:     Patient stable for discharge.  Wife at bedside to transport home.  Patient has all needed DME. Patient currently in OP rehab.  Patient will follow up with Urologist out patient.  Uses [facility] on [address]. No other TOC needs.   Final next level of care: Home/Self Care Barriers to Discharge: Barriers Resolved   Patient Goals and CMS Choice      Discharge Placement                         Discharge Plan and Services Additional resources added to the After Visit Summary for                                       Social Determinants of Health (SDOH) Interventions SDOH Screenings   Food Insecurity: No Food Insecurity (07/12/2022)  Housing: Low Risk  (07/12/2022)  Transportation Needs: No Transportation Needs (07/12/2022)  Utilities: Not At Risk (07/12/2022)  Tobacco Use: Low Risk  (07/11/2022)     Readmission Risk Interventions    07/15/2022   11:03 AM  Readmission Risk Prevention Plan  Transportation Screening Complete  PCP or Specialist Appt within 3-5 Days Complete  Social Work Consult for [place] Planning/Counseling Complete  Palliative Care Screening Not Applicable  Medication Review [employer] photographer) Complete

## 2022-07-15 NOTE — Discharge Summary (Signed)
Physician Discharge Summary  Jose Beck [id number] DOB: 12-Feb-1946 DOA: 07/11/2022  PCP: [name], MD  Admit date: 07/11/2022 Discharge date: 07/15/2022  Admitted From: Home  Discharge disposition: Home  Recommendations for Outpatient Follow-Up:   Follow up with your primary care provider in one week.  Check CBC, BMP, magnesium in the next visit Follow-up with Dr. Rosana Hoes urologist as outpatient for definitive Foley catheter management.   Discharge Diagnosis:   Principal Problem:   Sepsis secondary to UTI  [facility]) Active Problems:   Demand myocardial infarction [facility])   Paroxysmal atrial fibrillation (HCC)   Aortic valve stenosis, nonrheumatic   NSVT (nonsustained ventricular tachycardia) ([place])   Acute urinary retention   Discharge Condition: Improved.  Diet recommendation: Low sodium, heart healthy.  Carbohydrate-modified.   Wound care: None.  Code status: Full.   History of Present Illness:   Jose Beck is a 77 y.o. male with medical history significant of CAD s/p known occlusion of LAD and RCA by cath in 1992 with cath in 2017 showing similar findings with collateral flow noted, chronic combined systolic and diastolic CHF, hypertension, hyperlipidemia, type 2 diabetes, GERD,  AAA s/p repair complicated by endo leak followed by CT surgery and vascular and prior CVA as well as Afib on Eliquis hospital with complaints of shaking and chills with nausea and vomiting.  He had been feeling ill for at least 1 week at home.  In the ED, patient had leukocytosis with elevated creatinine at 1.8 from baseline 1.1.  Lactate was 1.8.  CT/ab pelvis showed Moderate hydronephrosis bilaterally with no obstructing stone. Urinary bladder was distended with prostatic enlargement suggestive bladder outlet obstruction with perinephric fat stranding. .Multiple enlarged lymph nodes in the retroperitoneum and left pelvic side wall. Aortic atherosclerosis with aneurysmal  dilatation of the infrarenal abdominal aorta measuring up to 6.3 cm with endograft in place, not significantly changed from the prior exam. Patient was then admitted hospital for further evaluation and treatment.   Hospital Course:   Following conditions were addressed during hospitalization as listed below,  Sepsis due to E. coli UTI Patient met sepsis criteria on presentation with fever tachycardia tachypnea and was noted to have acute urinary retention with bladder outlet obstruction and bilateral hydronephrosis.  Status post Foley placement.  During hospitalization patient received IV Rocephin.  Urine culture showing E. coli.  Will continue ciprofloxacin on discharge to complete the course.  Continue Flomax.  Follow-up with urology as outpatient, patient follows up with [facility] urology.  Spoke with Dr. Lovena Neighbours urology in-house who looked at the CT scan and due to the nature of hydronephrosis and bladder recommends continuation of Foley catheter for at least 1 week and follow-up with his outpatient urologist.  Communicated this with the patient's wife as well.   Acute kidney injury. Multifactorial from bladder outlet obstruction, infection.  Status post Foley catheter placement.  Latest creatinine of 1.4.  Continue to monitor renal function as outpatient..   Elevated troponin, known history of severe CAD Cardiology was consulted.  Thought process was likely demand ischemia.  2D echocardiogram with wall motion abnormality.  Cardiology recommended 48 hours of IV heparin and patient has completed.  No chest pain at this time.  Paroxysmal Afib Patient is on Eliquis and metoprolol at home.  Will be resumed on discharge.   Ischemic cardiomyopathy with moderate AS: 2D echo shows ejection fraction of 30 to 35%.   on Lasix at home.  Continue metoprolol.  Cardiology saw the patient during  hospitalization.  Not a candidate for TAVR.   Normocytic anemia:  Latest hemoglobin of 9.1 from initial  12.0.  Will need outpatient monitoring.  Thrombocytopenia: Latest platelet count of 112.  Improved during hospitalization.   Abdominal Aortic Aneurysm  -s/p repair complicated by endoleak. Followed by CT surgery and vascular outpatient   DM 2 , insulin dependent Hemoglobin A1c was 6.4.  Continue Lantus from home.  Diabetic diet.   Metastatic prostate CA  -Not felt to be a candidate for hormone therapy due to history of stroke. Follows up with urology at [facility].  Will need to follow-up with urology since patient has a Foley catheter at this time.   Hypertension  Will continue metoprolol and Lasix as outpatient.  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  Supportive care.   Hypomagnesemia:  Improved after replacement.  Latest magnesium of 2.2.   Hypokalemia: Improved.  Potassium prior to discharge was 3.9.  Disposition.  At this time, patient is stable for disposition home with outpatient PCP and urology follow-up.  Medical Consultants:   Cardiology  Procedures:    Foley catheter placement. Subjective:   Today, patient was seen and examined at bedside.  Patient denies any nausea, vomiting, fever, chills or rigor.  Wants to go home.  Patient's wife at bedside  Discharge Exam:   Vitals:   07/15/22 0225 07/15/22 0730  BP: 112/84 126/67  Pulse: 73 75  Resp: (!) 21 19  Temp: 98.5 F (36.9 C) 98.4 F (36.9 C)  SpO2: 96% 96%   Vitals:   07/14/22 1946 07/14/22 2345 07/15/22 0225 07/15/22 0730  BP: 111/64 115/70 112/84 126/67  Pulse: 72  73 75  Resp: 20 (!) 22 (!) 21 19  Temp: 98.8 F (37.1 C) 98.6 F (37 C) 98.5 F (36.9 C) 98.4 F (36.9 C)  TempSrc: Oral Oral Oral Oral  SpO2: 97%  96% 96%  Weight:   89 kg   Height:       Body mass index is 29.83 kg/m.  General: Alert awake, not in obvious distress, elderly male, hard of hearing, Communicative, HENT: pupils equally reacting to light,  No scleral pallor or icterus noted. Oral  mucosa is moist.  Chest:  Clear breath sounds.  Diminished breath sounds bilaterally. No crackles or wheezes.  CVS: S1 &S2 heard.  Systolic murmur noted.  Regular rate and rhythm. Abdomen: Soft, nontender, nondistended.  Bowel sounds are heard.  Foley catheter in place. Extremities: No cyanosis, clubbing or edema.  Peripheral pulses are palpable.  Difficulty lifting the right arm Psych: Alert, awake and oriented, normal mood CNS:  No cranial nerve deficits.  Moves all extremities. Skin: Warm and dry.  No rashes noted.  The results of significant diagnostics from this hospitalization (including imaging, microbiology, ancillary and laboratory) are listed below for reference.     Diagnostic Studies:   ECHOCARDIOGRAM COMPLETE  Result Date: 07/12/2022    ECHOCARDIOGRAM REPORT   Patient Name:   Jose Beck Date of Exam: 07/12/2022 Medical Rec #:  [id number]           Height:       68.0 in Accession #:    [id number]          Weight:       204.1 lb Date of Birth:  [birth year]          BSA:          2.062 m Patient Age:    45 years  BP:           129/72 mmHg Patient Gender: M                   HR:           90 bpm. Exam Location:  Inpatient Procedure: 2D Echo, Cardiac Doppler, Color Doppler and Intracardiac            Opacification Agent Indications:    NSTEMI I21.4  History:        Patient has prior history of Echocardiogram examinations, most                 recent 04/28/2022. CHF, CAD; Arrythmias:Atrial Fibrillation.  Sonographer:    Luane School RDCS Referring Phys: [id number] [facility]  Sonographer Comments: Suboptimal apical window and suboptimal subcostal window. IMPRESSIONS  1. Left ventricular ejection fraction, by estimation, is 30 to 35%. The left ventricle has moderately decreased function. The left ventricle demonstrates regional wall motion abnormalities (see scoring diagram/findings for description). There is mild left ventricular hypertrophy. Left ventricular diastolic  parameters are consistent with Grade II diastolic dysfunction (pseudonormalization). Elevated left atrial pressure.  2. Right ventricular systolic function is normal. The right ventricular size is mildly enlarged. There is normal pulmonary artery systolic pressure. The estimated right ventricular systolic pressure is [id number] mmHg.  3. Left atrial size was mildly dilated.  4. The mitral valve is normal in structure. Mild mitral valve regurgitation.  5. The inferior vena cava is normal in size with greater than 50% respiratory variability, suggesting right atrial pressure of 3 mmHg.  6. The aortic valve is calcified. Aortic valve regurgitation is not visualized. Severe aortic valve stenosis. Moderate AS by gradients (MG 25 mmHg, Vmax 3.2 m/s), severe by AVA (0.9cm^2) and DI (0.25). Low SV index (26 cc/m^2), suspect low flow low gradient severe AS FINDINGS  Left Ventricle: Left ventricular ejection fraction, by estimation, is 30 to 35%. The left ventricle has moderately decreased function. The left ventricle demonstrates regional wall motion abnormalities. Definity contrast agent was given IV to delineate the left ventricular endocardial borders. The left ventricular internal cavity size was normal in size. There is mild left ventricular hypertrophy. Left ventricular diastolic parameters are consistent with Grade II diastolic dysfunction (pseudonormalization). Elevated left atrial pressure.  LV Wall Scoring: The mid and distal anterior septum, mid and distal inferior wall, mid inferoseptal segment, apical anterior segment, and apex are akinetic. The anterior wall, entire lateral wall, basal anteroseptal segment, basal inferior segment, and basal inferoseptal segment are normal. Right Ventricle: The right ventricular size is mildly enlarged. No increase in right ventricular wall thickness. Right ventricular systolic function is normal. There is normal pulmonary artery systolic pressure. The tricuspid regurgitant velocity  is 2.72  m/s, and with an assumed right atrial pressure of 3 mmHg, the estimated right ventricular systolic pressure is [id number] mmHg. Left Atrium: Left atrial size was mildly dilated. Right Atrium: Right atrial size was normal in size. Pericardium: There is no evidence of pericardial effusion. Mitral Valve: The mitral valve is normal in structure. Mild mitral valve regurgitation. Tricuspid Valve: The tricuspid valve is normal in structure. Tricuspid valve regurgitation is trivial. Aortic Valve: The aortic valve is calcified. Aortic valve regurgitation is not visualized. Severe aortic stenosis is present. Aortic valve mean gradient measures 25.0 mmHg. Aortic valve peak gradient measures 42.0 mmHg. Aortic valve area, by VTI measures  0.87 cm. Pulmonic Valve: The pulmonic valve was not well visualized. Pulmonic valve regurgitation is trivial.  Aorta: The aortic root and ascending aorta are structurally normal, with no evidence of dilitation. Venous: The inferior vena cava is normal in size with greater than 50% respiratory variability, suggesting right atrial pressure of 3 mmHg. IAS/Shunts: The interatrial septum was not well visualized.  LEFT VENTRICLE PLAX 2D LVIDd:         5.50 cm      Diastology LVIDs:         4.00 cm      LV e' medial:    5.98 cm/s LV PW:         1.40 cm      LV E/e' medial:  20.9 LV IVS:        1.40 cm      LV e' lateral:   8.81 cm/s LVOT diam:     2.10 cm      LV E/e' lateral: 14.2 LV SV:         54 LV SV Index:   26 LVOT Area:     3.46 cm  LV Volumes (MOD) LV vol d, MOD A2C: 129.0 ml LV vol d, MOD A4C: 116.0 ml LV vol s, MOD A2C: 81.8 ml LV vol s, MOD A4C: 80.9 ml LV SV MOD A2C:     47.2 ml LV SV MOD A4C:     116.0 ml LV SV MOD BP:      45.2 ml RIGHT VENTRICLE             IVC RV S prime:     16.20 cm/s  IVC diam: 1.90 cm TAPSE (M-mode): 2.4 cm LEFT ATRIUM             Index        RIGHT ATRIUM           Index LA diam:        4.90 cm 2.38 cm/m   RA Area:     22.20 cm LA Vol (A2C):   71.9 ml 34.88  ml/m  RA Volume:   60.70 ml  29.44 ml/m LA Vol (A4C):   83.2 ml 40.36 ml/m LA Biplane Vol: 78.1 ml 37.88 ml/m  AORTIC VALVE AV Area (Vmax):    0.83 cm AV Area (Vmean):   0.79 cm AV Area (VTI):     0.87 cm AV Vmax:           324.00 cm/s AV Vmean:          234.000 cm/s AV VTI:            0.627 m AV Peak Grad:      42.0 mmHg AV Mean Grad:      25.0 mmHg LVOT Vmax:         77.50 cm/s LVOT Vmean:        53.600 cm/s LVOT VTI:          0.157 m LVOT/AV VTI ratio: 0.25  AORTA Ao Root diam: 2.90 cm Ao Asc diam:  3.70 cm MITRAL VALVE                TRICUSPID VALVE MV Area (PHT): 5.34 cm     TR Peak grad:   29.6 mmHg MV Decel Time: 142 msec     TR Vmax:        272.00 cm/s MV E velocity: 125.00 cm/s MV A velocity: 97.90 cm/s   SHUNTS MV E/A ratio:  1.28         Systemic VTI:  0.16 m  Systemic Diam: 2.10 cm Jose Milian MD Electronically signed by Jose Milian MD Signature Date/Time: 07/12/2022/1:08:08 PM    Final    CT CHEST ABDOMEN PELVIS WO CONTRAST  Result Date: 07/12/2022 CLINICAL DATA:  Sepsis.  Chills and shaking. EXAM: CT CHEST, ABDOMEN AND PELVIS WITHOUT CONTRAST TECHNIQUE: Multidetector CT imaging of the chest, abdomen and pelvis was performed following the standard protocol without IV contrast. RADIATION DOSE REDUCTION: This exam was performed according to the departmental dose-optimization program which includes automated exposure control, adjustment of the mA and/or kV according to patient size and/or use of iterative reconstruction technique. COMPARISON:  01/20/2022. FINDINGS: CT CHEST FINDINGS Cardiovascular: The heart is normal in size and there is no pericardial effusion. Three-vessel coronary artery calcifications are noted. There is atherosclerotic calcification of the aorta without evidence of aneurysm. The pulmonary trunk is normal in caliber. Mediastinum/Nodes: No mediastinal or axillary lymphadenopathy. Evaluation of the hila is limited due to lack of IV  contrast. The thyroid gland, trachea, and esophagus are within normal limits. There is a small to moderate hiatal hernia. Lungs/Pleura: Atelectasis is noted bilaterally. No effusion or pneumothorax. Musculoskeletal: There is bony deformity of the humeral head on the right, compatible with old healed fracture. Degenerative changes are present in the thoracic spine. Stable compression deformity is present at T12. No acute or suspicious osseous abnormality. CT ABDOMEN PELVIS FINDINGS Hepatobiliary: No focal abnormality. The liver has a nodular contour, compatible with underlying cirrhosis. No biliary ductal dilatation. The gallbladder is without stones. Pancreas: Unremarkable. No pancreatic ductal dilatation or surrounding inflammatory changes. Spleen: The spleen is enlarged measuring 15 cm. Adrenals/Urinary Tract: No adrenal nodule or mass. Vascular calcifications are noted at the renal hila bilaterally. No renal calculi. Perinephric fat stranding is noted bilaterally, greater on the left than on the right. There is moderate hydroureteronephrosis bilaterally with no evidence of obstructing stone. The bladder is markedly distended. Stomach/Bowel: Small to moderate hiatal hernia. Stomach is within normal limits. Appendix is not seen. No evidence of bowel wall thickening, distention, or inflammatory changes. No free air or pneumatosis. Vascular/Lymphatic: Aortic atherosclerosis. There is aneurysmal dilatation of the infrarenal abdominal aorta with endograft in place. The aneurysm sac measures 4.8 x 6.3 cm, not significantly changed from the prior exam. There has an enlarged lymph node in the retroperitoneum measuring 1.5 cm, increased from 9 mm on the prior exam. An enlarged lymph node is noted in the along the pelvic wall on the left measuring 1.6 cm. Reproductive: The prostate gland is enlarged. Other: No abdominopelvic ascites. Musculoskeletal: No acute fracture. Degenerative changes are present in the lumbar spine.  There is a lucency with cortical breakthrough in the inferior pubic ramus on the right measuring 1.8 cm. Stable hemangioma at L1. IMPRESSION: 1. Moderate hydronephrosis bilaterally with no obstructing stone. The urinary bladder is markedly distended in the prostate gland is enlarged, suggesting bladder outlet obstruction. Perinephric fat stranding is noted bilaterally and clinical correlation is recommended to exclude superimposed infection. 2. Morphologic changes of cirrhosis and portal hypertension. 3. Multiple enlarged lymph nodes in the retroperitoneum and left pelvic side wall, which may be infectious, inflammatory, or neoplastic. 4. Expansile lucency with cortical breakthrough in the inferior pubic ramus on the right. The possibility of metastatic disease can not be excluded. MRI is suggested for further evaluation. 5. Aortic atherosclerosis with aneurysmal dilatation of the infrarenal abdominal aorta measuring up to 6.3 cm with endograft in place, not significantly changed from the prior exam. 6. Multi-vessel coronary artery calcifications. 7. Remaining  incidental findings as described above. Electronically Signed   By: Brett Fairy M.D.   On: 07/12/2022 00:03   DG Chest Port 1 View  Result Date: 07/11/2022 CLINICAL DATA:  Cough and chills. EXAM: PORTABLE CHEST 1 VIEW COMPARISON:  04/29/2022. FINDINGS: Heart is enlarged and the mediastinal contour is within normal limits. There is atherosclerotic calcification of the aorta. Lung volumes are low. No consolidation, effusion, or pneumothorax. There is chronic inferior subluxation and bony deformity of the left humeral head. No acute osseous abnormality. IMPRESSION: 1. No active disease. 2. Cardiomegaly. Electronically Signed   By: Brett Fairy M.D.   On: 07/11/2022 20:31     Labs:   Basic Metabolic Panel: Recent Labs  Lab 07/11/22 1940 07/12/22 0326 07/13/22 0028 07/14/22 0020 07/15/22 0017  NA 141 139 138 135 136  K 3.7 3.8 3.4* 4.2 3.9  CL  103 103 104 102 103  CO2 '22 23 23 23 27  '$ GLUCOSE 173* 165* 144* 119* 105*  BUN 38* 37* 31* 33* 30*  CREATININE 1.80* 1.77* 1.67* 1.61* 1.40*  CALCIUM 8.9 8.7* 8.3* 8.0* 8.0*  MG  --  1.6* 1.8 1.7 2.2   GFR Estimated Creatinine Clearance: 48.6 mL/min (A) (by C-G formula based on SCr of 1.4 mg/dL (H)). Liver Function Tests: Recent Labs  Lab 07/11/22 1940 07/12/22 0326 07/13/22 0028  AST 23 73* 81*  ALT '28 29 28  '$ ALKPHOS 61 55 52  BILITOT 0.8 0.6 0.5  PROT 7.1 7.1 6.6  ALBUMIN 3.1* 2.9* 2.5*   Recent Labs  Lab 07/11/22 1940  LIPASE 43   No results for input(s): "AMMONIA" in the last 168 hours. Coagulation profile No results for input(s): "INR", "PROTIME" in the last 168 hours.  CBC: Recent Labs  Lab 07/12/22 0326 07/12/22 0554 07/13/22 0027 07/14/22 0020 07/15/22 0017  WBC 10.1 10.1 8.9 6.1 5.0  NEUTROABS 9.2*  --   --   --   --   HGB 9.9* 9.7* 9.2* 9.0* 9.1*  HCT 30.2* 28.9* 28.3* 26.9* 28.0*  MCV 98.1 95.1 95.9 95.1 96.2  PLT 109* 106* 98* 103* 112*   Cardiac Enzymes: No results for input(s): "CKTOTAL", "CKMB", "CKMBINDEX", "TROPONINI" in the last 168 hours. BNP: Invalid input(s): "POCBNP" CBG: Recent Labs  Lab 07/14/22 1944 07/14/22 2344 07/15/22 0312 07/15/22 0727 07/15/22 1135  GLUCAP 191* 113* 116* 144* 164*   D-Dimer No results for input(s): "DDIMER" in the last 72 hours. Hgb A1c No results for input(s): "HGBA1C" in the last 72 hours. Lipid Profile No results for input(s): "CHOL", "HDL", "LDLCALC", "TRIG", "CHOLHDL", "LDLDIRECT" in the last 72 hours. Thyroid function studies No results for input(s): "TSH", "T4TOTAL", "T3FREE", "THYROIDAB" in the last 72 hours.  Invalid input(s): "FREET3" Anemia work up No results for input(s): "VITAMINB12", "FOLATE", "FERRITIN", "TIBC", "IRON", "RETICCTPCT" in the last 72 hours. Microbiology Recent Results (from the past 240 hour(s))  Culture, blood (routine x 2)     Status: Abnormal   Collection Time:  07/11/22  7:52 PM   Specimen: BLOOD LEFT FOREARM  Result Value Ref Range Status   Specimen Description BLOOD LEFT FOREARM  Final   Special Requests   Final    BOTTLES DRAWN AEROBIC AND ANAEROBIC Blood Culture adequate volume   Culture  Setup Time   Final    GRAM NEGATIVE RODS IN BOTH AEROBIC AND ANAEROBIC BOTTLES CRITICAL RESULT CALLED TO, READ BACK BY AND VERIFIED WITH: PHARMD HALLEY V. [id number] FCP Performed at [facility], [address] [place], [place] [address]    Culture ESCHERICHIA COLI (A)  Final   Report Status 07/15/2022 FINAL  Final   Organism ID, Bacteria ESCHERICHIA COLI  Final      Susceptibility   Escherichia coli - MIC*    AMPICILLIN >=32 RESISTANT Resistant     CEFEPIME <=0.12 SENSITIVE Sensitive     CEFTAZIDIME RESISTANT Resistant     CEFTRIAXONE <=0.25 SENSITIVE Sensitive     CIPROFLOXACIN <=0.25 SENSITIVE Sensitive     GENTAMICIN <=1 SENSITIVE Sensitive     IMIPENEM <=0.25 SENSITIVE Sensitive     TRIMETH/SULFA <=20 SENSITIVE Sensitive     AMPICILLIN/SULBACTAM >=32 RESISTANT Resistant     PIP/TAZO <=4 SENSITIVE Sensitive     * ESCHERICHIA COLI  Blood Culture ID Panel (Reflexed)     Status: Abnormal   Collection Time: 07/11/22  7:52 PM  Result Value Ref Range Status   Enterococcus faecalis NOT DETECTED NOT DETECTED Final   Enterococcus Faecium NOT DETECTED NOT DETECTED Final   Listeria monocytogenes NOT DETECTED NOT DETECTED Final   Staphylococcus species NOT DETECTED NOT DETECTED Final   Staphylococcus aureus (BCID) NOT DETECTED NOT DETECTED Final   Staphylococcus epidermidis NOT DETECTED NOT DETECTED Final   Staphylococcus lugdunensis NOT DETECTED NOT DETECTED Final   Streptococcus species NOT DETECTED NOT DETECTED Final   Streptococcus agalactiae NOT DETECTED NOT DETECTED Final   Streptococcus pneumoniae NOT DETECTED NOT DETECTED Final   Streptococcus pyogenes NOT DETECTED NOT DETECTED Final   A.calcoaceticus-baumannii NOT DETECTED NOT  DETECTED Final   Bacteroides fragilis NOT DETECTED NOT DETECTED Final   Enterobacterales DETECTED (A) NOT DETECTED Final    Comment: Enterobacterales represent a large order of gram negative bacteria, not a single organism. CRITICAL RESULT CALLED TO, READ BACK BY AND VERIFIED WITH: PHARMD HALLEY V. 1023 [id number] FCP    Enterobacter cloacae complex NOT DETECTED NOT DETECTED Final   Escherichia coli DETECTED (A) NOT DETECTED Final    Comment: CRITICAL RESULT CALLED TO, READ BACK BY AND VERIFIED WITH: PHARMD HALLEY V. 1023 [id number] FCP    Klebsiella aerogenes NOT DETECTED NOT DETECTED Final   Klebsiella oxytoca NOT DETECTED NOT DETECTED Final   Klebsiella pneumoniae NOT DETECTED NOT DETECTED Final   Proteus species NOT DETECTED NOT DETECTED Final   Salmonella species NOT DETECTED NOT DETECTED Final   Serratia marcescens NOT DETECTED NOT DETECTED Final   Haemophilus influenzae NOT DETECTED NOT DETECTED Final   Neisseria meningitidis NOT DETECTED NOT DETECTED Final   Pseudomonas aeruginosa NOT DETECTED NOT DETECTED Final   Stenotrophomonas maltophilia NOT DETECTED NOT DETECTED Final   Candida albicans NOT DETECTED NOT DETECTED Final   Candida auris NOT DETECTED NOT DETECTED Final   Candida glabrata NOT DETECTED NOT DETECTED Final   Candida krusei NOT DETECTED NOT DETECTED Final   Candida parapsilosis NOT DETECTED NOT DETECTED Final   Candida tropicalis NOT DETECTED NOT DETECTED Final   Cryptococcus neoformans/gattii NOT DETECTED NOT DETECTED Final   CTX-M ESBL NOT DETECTED NOT DETECTED Final   Carbapenem resistance IMP NOT DETECTED NOT DETECTED Final   Carbapenem resistance KPC NOT DETECTED NOT DETECTED Final   Carbapenem resistance NDM NOT DETECTED NOT DETECTED Final   Carbapenem resist OXA 48 LIKE NOT DETECTED NOT DETECTED Final   Carbapenem resistance VIM NOT DETECTED NOT DETECTED Final    Comment: Performed at [facility], [address], [place], [place] [address]  Resp panel  by RT-PCR (RSV, Flu A&B, Covid) Anterior Nasal Swab  Status: None   Collection Time: 07/11/22  8:02 PM   Specimen: Anterior Nasal Swab  Result Value Ref Range Status   SARS Coronavirus 2 by RT PCR NEGATIVE NEGATIVE Final   Influenza A by PCR NEGATIVE NEGATIVE Final   Influenza B by PCR NEGATIVE NEGATIVE Final    Comment: (NOTE) The Xpert Xpress SARS-CoV-2/FLU/RSV plus assay is intended as an aid in the diagnosis of influenza from Nasopharyngeal swab specimens and should not be used as a sole basis for treatment. Nasal washings and aspirates are unacceptable for Xpert Xpress SARS-CoV-2/FLU/RSV testing.  Fact Sheet for Patients: EntrepreneurPulse.com.au  Fact Sheet for Healthcare Providers: IncredibleEmployment.be  This test is not yet approved or cleared by the [place] FDA and has been authorized for detection and/or diagnosis of SARS-CoV-2 by FDA under an Emergency Use Authorization (EUA). This EUA will remain in effect (meaning this test can be used) for the duration of the COVID-19 declaration under Section 564(b)(1) of the Act, 21 U.S.C. section 360bbb-3(b)(1), unless the authorization is terminated or revoked.     Resp Syncytial Virus by PCR NEGATIVE NEGATIVE Final    Comment: (NOTE) Fact Sheet for Patients: EntrepreneurPulse.com.au  Fact Sheet for Healthcare Providers: IncredibleEmployment.be  This test is not yet approved or cleared by the [place] FDA and has been authorized for detection and/or diagnosis of SARS-CoV-2 by FDA under an Emergency Use Authorization (EUA). This EUA will remain in effect (meaning this test can be used) for the duration of the COVID-19 declaration under Section 564(b)(1) of the Act, 21 U.S.C. section 360bbb-3(b)(1), unless the authorization is terminated or revoked.  Performed at [facility], [place] [address] [place], [place] [address]   Culture,  blood (routine x 2)     Status: Abnormal   Collection Time: 07/11/22  8:20 PM   Specimen: BLOOD RIGHT FOREARM  Result Value Ref Range Status   Specimen Description BLOOD RIGHT FOREARM  Final   Special Requests   Final    BOTTLES DRAWN AEROBIC AND ANAEROBIC Blood Culture adequate volume   Culture  Setup Time GRAM NEGATIVE RODS ANAEROBIC BOTTLE ONLY   Final   Culture (A)  Final    ESCHERICHIA COLI SUSCEPTIBILITIES PERFORMED ON PREVIOUS CULTURE WITHIN THE LAST 5 DAYS. Performed at [facility], [place] [address], [place], [place] [address]    Report Status 07/15/2022 FINAL  Final  Urine Culture (for pregnant, neutropenic or urologic patients or patients with an indwelling urinary catheter)     Status: Abnormal   Collection Time: 07/12/22  3:04 AM   Specimen: Urine, Clean Catch  Result Value Ref Range Status   Specimen Description URINE, CLEAN CATCH  Final   Special Requests   Final    NONE Performed at [facility], [place] [address] [place], [place] [address]    Culture >=100,000 COLONIES/mL ESCHERICHIA COLI (A)  Final   Report Status 07/15/2022 FINAL  Final   Organism ID, Bacteria ESCHERICHIA COLI (A)  Final      Susceptibility   Escherichia coli - MIC*    AMPICILLIN >=32 RESISTANT Resistant     CEFAZOLIN >=64 RESISTANT Resistant     CEFEPIME <=0.12 SENSITIVE Sensitive     CEFTRIAXONE 0.5 SENSITIVE Sensitive     CIPROFLOXACIN <=0.25 SENSITIVE Sensitive     GENTAMICIN <=1 SENSITIVE Sensitive     IMIPENEM <=0.25 SENSITIVE Sensitive     NITROFURANTOIN <=16 SENSITIVE Sensitive     TRIMETH/SULFA <=20 SENSITIVE Sensitive  AMPICILLIN/SULBACTAM >=32 RESISTANT Resistant     PIP/TAZO <=4 SENSITIVE Sensitive     * >=100,000 COLONIES/mL ESCHERICHIA COLI  MRSA Next Gen by PCR, Nasal     Status: None   Collection Time: 07/12/22  4:40 AM   Specimen: Nasal Mucosa; Nasal Swab  Result Value Ref Range Status   MRSA by PCR Next Gen NOT DETECTED NOT DETECTED Final    Comment:  (NOTE) The GeneXpert MRSA Assay (FDA approved for NASAL specimens only), is one component of a comprehensive MRSA colonization surveillance program. It is not intended to diagnose MRSA infection nor to guide or monitor treatment for MRSA infections. Test performance is not FDA approved in patients less than 63 years old. Performed at [facility], [place] [address] [place], [place] [address]      Discharge Instructions:   Discharge Instructions     Call MD for:  persistant nausea and vomiting   Complete by: As directed    Call MD for:  severe uncontrolled pain   Complete by: As directed    Call MD for:  temperature >100.4   Complete by: As directed    Diet - low sodium heart healthy   Complete by: As directed    Diet Carb Modified   Complete by: As directed    Discharge instructions   Complete by: As directed    Follow-up with your primary care provider in 1 week.  Check blood work at that time.  Follow-up with your urologist as outpatient dr Rosana Hoes in 1 to 2 weeks, continue Foley catheter until that time.  Seek medical attention for worsening symptoms.  Complete course of antibiotic.   Increase activity slowly   Complete by: As directed       Allergies as of 07/15/2022       Reactions   Cheese Nausea And Vomiting   Pravastatin Anxiety   Anxiety attack        Medication List     TAKE these medications    acetaminophen 650 MG CR tablet Commonly known as: TYLENOL Take 650 mg by mouth in the morning, at noon, and at bedtime.   atorvastatin 80 MG tablet Commonly known as: LIPITOR Take 1 tablet (80 mg total) by mouth daily at 6 PM.   calcium-vitamin D 250-100 MG-UNIT tablet Take 1 tablet by mouth daily. Takes in morning '600mg'$    ciprofloxacin 500 MG tablet Commonly known as: Cipro Take 1 tablet (500 mg total) by mouth 2 (two) times daily for 7 days.   Eliquis 5 MG Tabs tablet Generic drug: apixaban TAKE 1 TABLET BY MOUTH TWICE A DAY   furosemide 40 MG  tablet Commonly known as: LASIX Take 20 mg by mouth daily.   insulin detemir 100 UNIT/ML injection Commonly known as: LEVEMIR Inject 10 Units into the skin daily.   Klor-Con M20 20 MEQ tablet Generic drug: potassium chloride SA Take 10 mEq by mouth daily.   metoprolol succinate 25 MG 24 hr tablet Commonly known as: TOPROL-XL Take 1 tablet (25 mg total) by mouth daily. PLEASE KEEP SCHEDULED APPOINTMENT WITH CARDIOLOGIST   nitroGLYCERIN 0.4 MG SL tablet Commonly known as: NITROSTAT Place 0.4 mg under the tongue every 5 (five) minutes as needed for chest pain.   polyethylene glycol 17 g packet Commonly known as: MIRALAX / GLYCOLAX Take 17 g by mouth daily as needed for mild constipation or moderate constipation.   tacrolimus 0.1 % ointment Commonly known as: PROTOPIC Apply 1 application topically daily as  needed (facial scaling).   tamsulosin 0.4 MG Caps capsule Commonly known as: FLOMAX Take 0.4 mg by mouth daily.        Follow-up Information     Abbie Sons III, MD Follow up in 1 week(s).   Specialty: Urology Why: for foley cath management Contact information: [place] [address] [phone]         primary care provider Follow up in 1 week(s).   Why: blood work, routine checkup Contact information: Office will call you.                 Time coordinating discharge: 39 minutes  Signed:  Arena Lindahl  [facility] 07/15/2022, 2:14 PM

## 2022-07-15 NOTE — Evaluation (Signed)
Clinical/Bedside Swallow Evaluation Patient Details  Name: Jose Beck MRN: [id number] Date of Birth: 23-Jun-1945  Today's Date: 07/15/2022 Time: SLP Start Time (ACUTE ONLY): [id number] SLP Stop Time (ACUTE ONLY): 1001 SLP Time Calculation (min) (ACUTE ONLY): 12 min  Past Medical History:  Past Medical History:  Diagnosis Date   Abdominal aortic aneurysm (AAA) ([place])    Aortic stenosis    a. 11/2015 Echo: EF 55-60%, Gr1 DD, mild AS.   Chronic combined systolic and diastolic CHF (congestive heart failure) ([place])    a. 07/2015: TEE showing EF of 40% b. 11/2015: Echo w/ EF 55-60%, Grade 1 DD, mild AS.   Coronary artery disease    a. patient reports 100% stenosis of LAD and RCA by cath in 1999. b. 11/2015 Cath: LM nl, LAD 100 - fills by L->L collats from D1, LCX 60d, OM1 lalrge/nl, OM2 small/nl, RCA 159m L->Jose collats.EF 35-45% (55-60% by echo).   Diabetes mellitus without complication ([place]    Type II   GERD (gastroesophageal reflux disease)    Hyperlipemia    Hypertension    Hypertensive heart disease    Stroke ([facility]    a. 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   Thrombocytopenia ([facility] 11/2015   Past Surgical History:  Past Surgical History:  Procedure Laterality Date   ABDOMINAL AORTIC ENDOVASCULAR STENT GRAFT N/A 11/25/2017   Procedure: ABDOMINAL AORTIC ENDOVASCULAR STENT GRAFT;  Surgeon: BSerafina Mitchell MD;  Location: [facility] OR;  Service: Vascular;  Laterality: N/A;   CARDIAC CATHETERIZATION     CARDIAC CATHETERIZATION N/A 11/16/2015   Procedure: Left Heart Cath and Coronary Angiography;  Surgeon: HBelva Crome MD;  [facility];  Service: Cardiovascular;  Laterality: N/A;   MOLE REMOVAL     TONSILLECTOMY     HPI:  Jose Beck a 77y.o. male who presented to ED with N/V, chills. Dx sepsis due to E Coli UTI,AKI,  elevated troponin.  Pt's wife reports that pt occasionally coughs during meals. Baseline dysarthria, aphasia after 2017 CVA. Medical history  significant for severe CAD s/p known occlusion of LAD and RCA, chronic combined systolic and diastolic CHF, hypertension, hyperlipidemia, type 2 diabetes, GERD,  AAA s/p repair,  CVA 2017, Afib on Eliquis hospital, metastatic prostate CA.    Assessment / Plan / Recommendation  Clinical Impression  Pt presents with functional oropharyngeal swallowing marked by thorough mastication, the appearance of a brisk swallow response, no oral residue post-swallow, and no coughing or other s/s of aspiration, even when taxed with successive boluses of thin liquids or mixed consistencies. His wife reports occasional coughing when eating, but not with daily or even weekly frequency and no more than what is deemed typical.  No dysphagia identified. Continue current diet; thin liquids; meds whole with liquid. No SLP f/u is needed. SLP Visit Diagnosis: Dysphagia, unspecified (R13.10)    Aspiration Risk  No limitations    Diet Recommendation   Regular solids, thin liquids  Medication Administration: Whole meds with liquid    Other  Recommendations Oral Care Recommendations: Oral care BID    Recommendations for follow up therapy are one component of a multi-disciplinary discharge planning process, led by the attending physician.  Recommendations may be updated based on patient status, additional functional criteria and insurance authorization.  Follow up Recommendations No SLP follow up        Swallow Study   General HPI: RDalan Beck a 77y.o. male who presented to ED  with N/V, chills. Dx sepsis due to E Coli UTI,AKI,  elevated troponin.  Pt's wife reports that pt occasionally coughs during meals. Baseline dysarthria, aphasia after 2017 CVA. Medical history significant for severe CAD s/p known occlusion of LAD and RCA, chronic combined systolic and diastolic CHF, hypertension, hyperlipidemia, type 2 diabetes, GERD,  AAA s/p repair,  CVA 2017, Afib on Eliquis hospital, metastatic prostate CA. Type of  Study: Bedside Swallow Evaluation Previous Swallow Assessment: no Diet Prior to this Study: Regular;Thin liquids (Level 0) Temperature Spikes Noted: No Respiratory Status: Room air History of Recent Intubation: No Behavior/Cognition: Alert;Cooperative Oral Cavity Assessment: Within Functional Limits Oral Care Completed by SLP: No Oral Cavity - Dentition: Adequate natural dentition Vision: Functional for self-feeding Self-Feeding Abilities: Able to feed self Patient Positioning: Upright in bed Baseline Vocal Quality: Normal Volitional Cough: Strong Volitional Swallow: Able to elicit    Oral/Motor/Sensory Function Overall Oral Motor/Sensory Function: Within functional limits   Ice Chips Ice chips: Within functional limits   Thin Liquid Thin Liquid: Within functional limits    Nectar Thick Nectar Thick Liquid: Not tested   Honey Thick Honey Thick Liquid: Not tested   Puree Puree: Within functional limits   Solid     Solid: Within functional limits      Jose Beck 07/15/2022,10:06 AM  Jose Bamberg L. Tivis Ringer, MA CCC/SLP Clinical Specialist - [place] Office number [phone]

## 2022-07-15 NOTE — Progress Notes (Signed)
[place] for heparin infusion transition to apixaban Indication:  Afib and elevated troponin  Allergies  Allergen Reactions   Cheese Nausea And Vomiting   Pravastatin Anxiety    Anxiety attack    Patient Measurements: Height: '5\' 8"'$  (172.7 cm) Weight: 89 kg (196 lb 3.4 oz) IBW/kg (Calculated) : 68.4 Heparin Dosing Weight: 90kg  Vital Signs: Temp: 98.4 F (36.9 C) (03/05 0730) Temp Source: Oral (03/05 0730) BP: 126/67 (03/05 0730) Pulse Rate: 75 (03/05 0730)  Labs: Recent Labs     0000 07/12/22 0852 07/12/22 1044 07/12/22 2013 07/13/22 0027 07/13/22 0028 07/13/22 1247 07/14/22 0020 07/15/22 0017  HGB  --   --   --    < > 9.2*  --   --  9.0* 9.1*  HCT  --   --   --   --  28.3*  --   --  26.9* 28.0*  PLT  --   --   --   --  98*  --   --  103* 112*  APTT  --   --  40*   < > 126*  --  85* 87* 94*  HEPARINUNFRC   < >  --  >1.10*  --  >1.10*  --   --  >1.10* 0.80*  CREATININE  --   --   --   --   --  1.67*  --  1.61* 1.40*  TROPONINIHS  --  12,829* 13,452*  --   --   --   --   --   --    < > = values in this interval not displayed.     Estimated Creatinine Clearance: 48.6 mL/min (A) (by C-G formula based on SCr of 1.4 mg/dL (H)).   Medical History: Past Medical History:  Diagnosis Date   Abdominal aortic aneurysm (AAA) ([place])    Aortic stenosis    a. 11/2015 Echo: EF 55-60%, Gr1 DD, mild AS.   Chronic combined systolic and diastolic CHF (congestive heart failure) ([place])    a. 07/2015: TEE showing EF of 40% b. 11/2015: Echo w/ EF 55-60%, Grade 1 DD, mild AS.   Coronary artery disease    a. patient reports 100% stenosis of LAD and RCA by cath in 1999. b. 11/2015 Cath: LM nl, LAD 100 - fills by L->L collats from D1, LCX 60d, OM1 lalrge/nl, OM2 small/nl, RCA 150m L->R collats.EF 35-45% (55-60% by echo).   Diabetes mellitus without complication ([facility] [place]    Type II   GERD (gastroesophageal reflux disease)    Hyperlipemia     Hypertension    Hypertensive heart disease    Stroke ([facility]    a. 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   Thrombocytopenia ([facility] [place] 11/2015    Assessment: 89yomale c/o chills, shaking, and N/V > admitted for sepsis d/t UTI with urinary retention and AKI; troponin found to be elevated > plan to transition from Eliquis PTA (last dose 3/1 0715) for Afib to heparin during w/u.  aPTT 94, therapeutic Heparin level 0.8 (not correlating with aPTT in setting of recent apixaban administration) Current heparin infusion rate: 1200 units/hr  Hgb 9.1, Plt 112 - stable No known issues with IV infusion.  No overt bleeding or complications noted per RN.  Goal of Therapy:  Heparin level 0.3-0.7 units/ml aPTT 66-102 seconds Monitor platelets by anticoagulation protocol: Yes   Plan:  Discontinue heparin infusion at the time of administering first dose of apixaban Start apixaban '5mg'$  po  BID  Monitor daily CBC and for s/sx of bleeding   Luisa Hart, PharmD, BCPS Clinical Pharmacist 07/15/2022 7:41 AM   Please refer to [facility] for pharmacy phone number

## 2022-07-16 ENCOUNTER — Ambulatory Visit: Payer: Medicare Other

## 2022-07-16 ENCOUNTER — Other Ambulatory Visit: Payer: Self-pay

## 2022-07-16 DIAGNOSIS — I7143 Infrarenal abdominal aortic aneurysm, without rupture: Secondary | ICD-10-CM

## 2022-07-21 ENCOUNTER — Ambulatory Visit: Payer: Medicare Other | Admitting: Physical Therapy

## 2022-07-22 ENCOUNTER — Other Ambulatory Visit: Payer: Self-pay | Admitting: Diagnostic Radiology

## 2022-07-22 DIAGNOSIS — I9789 Other postprocedural complications and disorders of the circulatory system, not elsewhere classified: Secondary | ICD-10-CM

## 2022-07-22 LAB — BASIC METABOLIC PANEL: EGFR: 53.7

## 2022-07-23 ENCOUNTER — Ambulatory Visit: Payer: Medicare Other

## 2022-07-28 ENCOUNTER — Ambulatory Visit: Payer: Medicare Other | Attending: Internal Medicine

## 2022-07-28 DIAGNOSIS — R2681 Unsteadiness on feet: Secondary | ICD-10-CM | POA: Diagnosis present

## 2022-07-28 DIAGNOSIS — M6281 Muscle weakness (generalized): Secondary | ICD-10-CM | POA: Diagnosis not present

## 2022-07-28 DIAGNOSIS — R262 Difficulty in walking, not elsewhere classified: Secondary | ICD-10-CM | POA: Insufficient documentation

## 2022-07-28 DIAGNOSIS — R2689 Other abnormalities of gait and mobility: Secondary | ICD-10-CM | POA: Diagnosis present

## 2022-07-28 NOTE — Therapy (Signed)
OUTPATIENT PHYSICAL THERAPY NEURO TREATMENT and Recertification   Patient Name: Jose Beck MRN: [id number] DOB:05-Jun-1945, 77 y.o., male Today's Date: 07/28/2022   PCP: [name], MD REFERRING PROVIDER: [name], MD    END OF SESSION:  PT End of Session - 07/28/22 1457     Visit Number 14    Number of Visits 19    Date for PT Re-Evaluation 08/25/22    Authorization Type [employer]    Progress Note Due on Visit 33    PT Start Time 1449    PT Stop Time 1530    PT Time Calculation (min) 41 min    Equipment Utilized During Treatment Gait belt    Activity Tolerance Patient tolerated treatment well    Behavior During Therapy WFL for tasks assessed/performed             Past Medical History:  Diagnosis Date   Abdominal aortic aneurysm (AAA) ([place])    Aortic stenosis    a. 11/2015 Echo: EF 55-60%, Gr1 DD, mild AS.   Chronic combined systolic and diastolic CHF (congestive heart failure) ([place])    a. 07/2015: TEE showing EF of 40% b. 11/2015: Echo w/ EF 55-60%, Grade 1 DD, mild AS.   Coronary artery disease    a. patient reports 100% stenosis of LAD and RCA by cath in 1999. b. 11/2015 Cath: LM nl, LAD 100 - fills by L->L collats from D1, LCX 60d, OM1 lalrge/nl, OM2 small/nl, RCA 169m, L->R collats.EF 35-45% (55-60% by echo).   Diabetes mellitus without complication ([place])    Type II   GERD (gastroesophageal reflux disease)    Hyperlipemia    Hypertension    Hypertensive heart disease    Stroke ([place])    a. 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   Thrombocytopenia ([place]) 11/2015   Past Surgical History:  Procedure Laterality Date   ABDOMINAL AORTIC ENDOVASCULAR STENT GRAFT N/A 11/25/2017   Procedure: ABDOMINAL AORTIC ENDOVASCULAR STENT GRAFT;  Surgeon: Serafina Mitchell, MD;  Location: [facility];  Service: Vascular;  Laterality: N/A;   CARDIAC CATHETERIZATION     CARDIAC CATHETERIZATION N/A 11/16/2015   Procedure: Left Heart Cath and Coronary  Angiography;  Surgeon: [name], MD;  Location: [facility];  Service: Cardiovascular;  Laterality: N/A;   MOLE REMOVAL     TONSILLECTOMY     Patient Active Problem List   Diagnosis Date Noted   Acute urinary retention 07/15/2022   Aortic valve stenosis, nonrheumatic 07/13/2022   NSVT (nonsustained ventricular tachycardia) ([place]) 07/13/2022   Sepsis secondary to UTI ([place]) [id number]   Acute systolic heart failure ([place]) 04/27/2022   Paroxysmal atrial fibrillation ([place]) 04/26/2022   Demand myocardial infarction ([place]) 04/26/2022   Demand ischemia 04/26/2022   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   SAH (subarachnoid hemorrhage) ([place]) 12/19/2021   Cerebrovascular accident ([place]) 01/04/2018   Compression fracture of thoracic vertebra ([place]) 01/04/2018   AAA (abdominal aortic aneurysm) ([place]) 11/25/2017   History of stroke 01/15/2017   Hyperlipidemia LDL goal <70 04/18/2016   Hypertension    Hypertensive heart disease    Chest pain 11/16/2015   Type 2 diabetes mellitus with circulatory disorder, with long-term current use of insulin ([place]) 11/16/2015   Essential hypertension 11/16/2015   Thrombocytopenia ([place]) 11/16/2015   Leukopenia 11/16/2015   Hyponatremia 11/16/2015   Chronic combined systolic and diastolic CHF (congestive heart failure) ([place]) 11/16/2015   CAD in native artery    Brainstem infarct, acute ([place]) 09/17/2015  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  PAIN:  Are you having pain? No  PRECAUTIONS: Fall  WEIGHT BEARING RESTRICTIONS: No  FALLS: Has patient fallen in last 6 months? Yes. Number of falls 5  LIVING ENVIRONMENT: Lives with: lives with their family and lives with their spouse Lives in: House/apartment, ground floor set-up Stairs: Yes, garage stairs into house, will install rail on left side Has following equipment at home: Gilford Rile - 2 wheeled  PLOF: Independent with household mobility with device, Needs assistance with ADLs, and Needs assistance with homemaking. Mostly able to dress self, needs help with Depends. Some assist with shower transfers (4" lip)  PATIENT GOALS:   OBJECTIVE:   TODAY'S TREATMENT: 07/28/22 Activity Comments  Hip add iso 3x10  LAQ 3x10 4#  Sidestepping x 2 min 4#   Standing hamstring curls 2x10 4#  Standing on foam -EO x 30 sec -EC 3x10 sec -head turns EO/EC --completed as such 2 rounds  NU-step level 3 x 8 min  LE only for general conditioning     TODAY'S TREATMENT: 07/09/22 Activity Comments  Hi knee 1x10 alt   Step advancement over bolster 1x10   Gastroc stretch 1x60 sec   Sidestepping x 2 min // bars  Rest period 1.5 min   Standing on foam -EO 3x30 sec -EC 3x15 sec (mild-mod sway) -EO head turns 3x  Alt stair taps 1x10 10# RLE, 5# LLE 6" step  LAQ 4x10 RLE 10#, visual target for full extension, 30 sec rest between sets for max power  Foot on step 2x30 sec Cues for RLE extension and trunk extension for SLS   Gait training -use of visual cues to promote RLE step length--no success       HOME EXERCISE PROGRAM: Access Code: [email or web address] URL: [email or web address] Date: 07/07/2022 Prepared by: Sherlyn Lees  Exercises - Mini Squat with Counter Support  - 1 x daily - 7 x weekly - 3 sets - 10 reps - Side Stepping with Resistance at Thighs and Counter Support  - 1 x daily - 7 x weekly - 3 sets - 2 min hold - Standing March with Counter Support  - 1 x daily - 7 x weekly - 3 sets - 10 reps - Seated Long Arc Quad with Ankle Weight  - 1 x daily - 7 x weekly - 3 sets - 10 reps - Standing Hip Abduction with Ankle Weight  - 1 x daily - 7 x weekly - 3 sets - 10 reps - Standing Alternating Knee Flexion with Ankle Weights  - 1 x daily - 7 x weekly - 3 sets - 10 reps ----------------------------------------  From eval: DIAGNOSTIC FINDINGS: CT head and C-spine which were both negative.  COGNITION: Overall cognitive status: Within functional limits for tasks assessed and has some aphasia from hx of CVA   SENSATION: Not tested notes numbness along RUE  COORDINATION: Grossly impaired RUE/RLE Inability to perform rapid alternating movements, heel to shin RLE. Unable to perform finger to nose RUE due to deficits/issues  EDEMA:    MUSCLE TONE: NT   DTRs:  NT  POSTURE: rounded shoulders and forward head  LOWER EXTREMITY ROM:     Active  Right Eval Left Eval  Hip flexion    Hip extension    Hip abduction    Hip adduction    Hip internal rotation    Hip external rotation    Knee flexion    Knee extension    Ankle dorsiflexion 10 15  Ankle plantarflexion    Ankle inversion    Ankle eversion     (Blank rows = not tested)  LOWER EXTREMITY MMT:    Grossly 4/5 BLE  BED MOBILITY:  NT  TRANSFERS: Assistive device utilized: Environmental consultant - 2 wheeled and None  Sit to stand: Complete Independence and Modified independence Stand to sit: Complete Independence and Modified  independence Chair to chair: Complete Independence and Modified independence Floor:  NT  RAMP:  NT  CURB:  Level of Assistance: CGA and Min A Assistive device utilized: Environmental consultant - 2 wheeled Curb Comments:   STAIRS: Level of Assistance: Min A Stair Negotiation Technique: Step to Pattern with Bilateral Rails Number of Stairs: 5  Height of Stairs: 4-6"  Comments: right Trendelenberg very prominent with stairs and profound difficulty achieving foot clearance in descending  GAIT: Gait pattern: step to pattern and shuffling Distance walked:  Assistive device utilized: Environmental consultant - 2 wheeled Level of assistance: SBA Comments:   FUNCTIONAL TESTS:  5 times sit to stand: 16.47 sec Timed up and go (TUG): 57.43 sec 10 meter walk test: NT Berg Balance Scale: NT  PATIENT SURVEYS:  N/A     PATIENT EDUCATION: Education details: assessment findings, rationale for PT intervention Person educated: Patient Education method: Explanation Education comprehension: verbalized understanding    GOALS: Goals reviewed with patient? Yes  SHORT TERM GOALS: Target date: 06/24/2022    Patient will be independent in HEP to improve functional outcomes Baseline: Goal status: MET  2.  Demo reduced risk for falls and improved BLE strength per time 15 sec 5xSTS Baseline: 16 sec; (06/23/22) 13.75 sec minimal LUE use Goal status: MET  3.  Improve independence with stair ambulation at SBA-CGA using HR on left with or without AD to improve safety with home entry/exit Baseline: min A; (06/23/22) SBA-CGA Goal status: MET  4. Patient to be independent in advanced HEP to include strength, balance, and ambulation bouts.  Baseline:  Goal status: INITIAL  LONG TERM GOALS: Target date: 07/28/2022   Demo reduced risk for falls per TUG test 35 sec w/ RW Baseline: 57 sec; (06/23/22) 35 sec Goal status: REVISED  2.  Demo improved efficiency of gait per speed of 2.5 ft/sec 10 meter walk test Baseline: 0.48  ft/sec Goal status: IN PROGRESS  3.  Improve safety with mobility per supervision performance with stair ambulation and curb negotiation Baseline: min A w/ RW; (06/23/22) SBA-CGA Goal status: IN PROGRESS  4. Report/demonstrate ability to ambulate household distances w/ cane to meet patient goal  Baseline: unable  Goal status: IN PROGRESS   ASSESSMENT:  CLINICAL IMPRESSION: Returns following absence related to  acute UTI and hospitalization.  Demonstrates performance fairly consistent with his ability prior to illness albeit with increased fatigue and need for more frequent therapeutic rest periods.  Pt would benefit from continued sessions to re-assess HEP and hopefully find some gait interventions that will carryover to home/community performance.  OBJECTIVE IMPAIRMENTS: Abnormal gait, decreased activity tolerance, decreased balance, decreased coordination, decreased knowledge of use of DME, decreased mobility, difficulty walking, decreased strength, impaired tone, and impaired UE functional use.   ACTIVITY LIMITATIONS: carrying, lifting, bending, stairs, transfers, reach over head, and locomotion level  PARTICIPATION LIMITATIONS: meal prep, cleaning, laundry, interpersonal relationship, and community activity  PERSONAL FACTORS: Age, Time since onset of injury/illness/exacerbation, and 3+ comorbidities: CVA, prostate CA, compression fx  are also affecting patient's functional outcome.   REHAB POTENTIAL: Good  CLINICAL DECISION MAKING: Evolving/moderate complexity  EVALUATION COMPLEXITY: Moderate  PLAN:  PT FREQUENCY: 2x/wk x 5 wks  PT DURATION: other: 5 wks--10 sessions  PLANNED INTERVENTIONS: Therapeutic exercises, Therapeutic activity, Neuromuscular re-education, Balance training, Gait training, Patient/Family education, Self Care, Joint mobilization, Stair training, Vestibular training, Canalith repositioning, Orthotic/Fit training, DME instructions, Aquatic Therapy, Dry  Needling, Spinal mobilization, Cryotherapy, Moist heat, and Manual therapy  PLAN FOR NEXT SESSION: step height/length (T-band assisted?)   2:57 PM, 07/28/22 M. Sherlyn Lees, PT, DPT Physical Therapist- [place] Office Number: [phone]   [place] at [facility] [address], [place], [place] [address] Phone # [phone] Fax # [phone]

## 2022-07-29 NOTE — Progress Notes (Unsigned)
Cardiology Clinic Note   Patient Name: Jose Beck Date of Encounter: 07/30/2022  Primary Care Provider:  [name], MD Primary Cardiologist:  Peter Martinique, MD  Patient Profile    Jose Beck is to the clinic today for follow-up evaluation of his coronary artery disease and hypertension.  Past Medical History    Past Medical History:  Diagnosis Date   Abdominal aortic aneurysm (AAA) ([place])    Aortic stenosis    a. 11/2015 Echo: EF 55-60%, Gr1 DD, mild AS.   Chronic combined systolic and diastolic CHF (congestive heart failure) ([place] )    a. 07/2015: TEE showing EF of 40% b. 11/2015: Echo w/ EF 55-60%, Grade 1 DD, mild AS.   Coronary artery disease    a. patient reports 100% stenosis of LAD and RCA by cath in 1999. b. 11/2015 Cath: LM nl, LAD 100 - fills by L->L collats from D1, LCX 60d, OM1 lalrge/nl, OM2 small/nl, RCA 115m, L->R collats.EF 35-45% (55-60% by echo).   Diabetes mellitus without complication ([place])    Type II   GERD (gastroesophageal reflux disease)    Hyperlipemia    Hypertension    Hypertensive heart disease    Stroke ([place])    a. 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   Thrombocytopenia ([place]) 11/2015   Past Surgical History:  Procedure Laterality Date   ABDOMINAL AORTIC ENDOVASCULAR STENT GRAFT N/A 11/25/2017   Procedure: ABDOMINAL AORTIC ENDOVASCULAR STENT GRAFT;  Surgeon: Serafina Mitchell, MD;  Location: [facility] OR;  Service: Vascular;  Laterality: N/A;   CARDIAC CATHETERIZATION     CARDIAC CATHETERIZATION N/A 11/16/2015   Procedure: Left Heart Cath and Coronary Angiography;  Surgeon: [name], MD;  Location: [facility];  Service: Cardiovascular;  Laterality: N/A;   MOLE REMOVAL     TONSILLECTOMY      Allergies  Allergies  Allergen Reactions   Cheese Nausea And Vomiting   Pravastatin Anxiety    Anxiety attack    History of Present Illness    Jose Beck has a PMH of cerebral infarct, type 2 diabetes, HTN,  CAD, HLD, AAA, chronic combined systolic and diastolic CHF, GERD demand ischemia, PVCs, NSVT, obesity, and thrombocytopenia.  He underwent cardiac catheterization with PCI to his LAD and RCA in 1992.  He had subsequent cath and 2017 and was noted to have collateral flow.  (Mid RCA 100%, first diagonal 70%, mid LAD 100% distal RCA 100%, distal circumflex 60% he is status post AAA repair which was complicated by endovascular leak.    He presented to the emergency department on 07/12/2022 and was discharged on 07/15/2022.  He complained of chills, nausea, vomiting, and shakiness.  He reported symptoms for 1 week.  In the emergency department he was noted to have leukocytosis and elevated creatinine at 1.8.  His baseline creatinine is around 1.1.  His lactate was noted to be 1.8.  CT of his abdomen and pelvis showed moderate hydronephrosis bilaterally.  No obstructing calculi were noted.  His bladder was distended.  He was noted to have prostate enlargement suggesting bladder outlet obstruction.  He was also noted to have multiple enlarged lymph nodes in the retroperitoneum and left pelvic sidewall.  He was noted to have aortic atherosclerosis with aneurysmal dilation of the abdominal aorta measuring up to 6.3 cm with endograft in place.  There were no significant changes from his previous exam.  He was admitted for evaluation and treatment.  He underwent Foley  placement and was placed on IV Rocephin.  His urine culture showed E. coli.  He was given instructions to complete course of ciprofloxacin, continue his Flomax and follow-up with urology.  It was recommended that due to his hydronephrosis and bladder, his Foley catheter should stay in place for at least 1 week with urology follow-up as an outpatient.  He was noted to have elevated troponin.  Cardiology was consulted.  It was felt that his elevated troponins were due to demand ischemia.  Echocardiogram 07/12/2022 showed an LVEF of 30-35%, G2 DD, mildly dilated left  atria, mild mitral valve regurgitation, calcified aortic valve with moderate aortic stenosis, suspect low-flow low gradient severe AS.  Not a candidate for TAVR.  He denied chest pain.  His Eliquis and metoprolol were continued for his paroxysmal atrial fibrillation.  He presents to the clinic today for follow-up evaluation and states he continues to do his physical therapy exercises daily.  He also does memory puzzles and other exercises for memory post CVA.  He has been using 5 pound ankle weights.  We reviewed his recent hospitalization and medications.  He and his wife expressed understanding.  He presents to the clinic today using a walker.  Due to his reduced EF I will start losartan 12.5 mg daily in the afternoon.  He will see his PCP Dr. Joya Salm at beginning of April.  I will have him have been met drawn at that time.  Will plan follow-up for 1 month.  Today denies chest pain, shortness of breath, lower extremity edema, fatigue, palpitations, melena, hematuria, hemoptysis, diaphoresis, weakness, presyncope, syncope, orthopnea, and PND.    Home Medications    Prior to Admission medications   Medication Sig Start Date End Date Taking? Authorizing Provider  acetaminophen (TYLENOL) 650 MG CR tablet Take 650 mg by mouth in the morning, at noon, and at bedtime.    [provider]  apixaban (ELIQUIS) 5 MG TABS tablet TAKE 1 TABLET BY MOUTH TWICE A DAY 06/10/22   Martinique, Peter M, MD  atorvastatin (LIPITOR) 80 MG tablet Take 1 tablet (80 mg total) by mouth daily at 6 PM. 04/25/16   Martinique, Peter M, MD  calcium-vitamin D 250-100 MG-UNIT tablet Take 1 tablet by mouth daily. Takes in morning 600mg     [provider]  furosemide (LASIX) 40 MG tablet Take 20 mg by mouth daily. 06/26/22   [provider]  insulin detemir (LEVEMIR) 100 UNIT/ML injection Inject 10 Units into the skin daily.    [provider]  KLOR-CON M20 20 MEQ tablet Take 10 mEq by mouth daily. 06/26/22    [provider]  metoprolol succinate (TOPROL-XL) 25 MG 24 hr tablet Take 1 tablet (25 mg total) by mouth daily. PLEASE KEEP SCHEDULED APPOINTMENT WITH CARDIOLOGIST 06/20/22   Martinique, Peter M, MD  nitroGLYCERIN (NITROSTAT) 0.4 MG SL tablet Place 0.4 mg under the tongue every 5 (five) minutes as needed for chest pain.    [provider]  polyethylene glycol (MIRALAX / GLYCOLAX) 17 g packet Take 17 g by mouth daily as needed for mild constipation or moderate constipation. 07/15/22   Pokhrel, Corrie Mckusick, MD  tacrolimus (PROTOPIC) 0.1 % ointment Apply 1 application topically daily as needed (facial scaling).    [provider]  tamsulosin (FLOMAX) 0.4 MG CAPS capsule Take 0.4 mg by mouth daily. 05/21/22   [provider]    Family History    Family History  Problem Relation Age of Onset   Hypertension  Mother    Heart disease Mother    He indicated that his mother is deceased. He indicated that his father is deceased. He indicated that his maternal grandmother is deceased. He indicated that his maternal grandfather is deceased. He indicated that his paternal grandmother is deceased. He indicated that his paternal grandfather is deceased.  Social History    Social History   Socioeconomic History   Marital status: Married    Spouse name: [name]   Number of children: 0   Years of education: College   Highest education level: Not on file  Occupational History   Occupation: Retired   Tobacco Use   Smoking status: Never   Smokeless tobacco: Never  Vaping Use   Vaping Use: Never used  Substance and Sexual Activity   Alcohol use: Yes    Alcohol/week: 1.0 standard drink of alcohol    Types: 1 Shots of liquor per week    Comment: cocktails every day   Drug use: No   Sexual activity: Not on file  Other Topics Concern   Not on file  Social History Narrative   Drinks coffee daily    Social Determinants of Health   Financial Resource Strain: Not on file  Food  Insecurity: No Food Insecurity (07/12/2022)   Hunger Vital Sign    Worried About Running Out of Food in the Last Year: Never true    Ran Out of Food in the Last Year: Never true  Transportation Needs: No Transportation Needs (07/12/2022)   PRAPARE - Hydrologist (Medical): No    Lack of Transportation (Non-Medical): No  Physical Activity: Not on file  Stress: Not on file  Social Connections: Not on file  Intimate Partner Violence: Not At Risk (07/12/2022)   Humiliation, Afraid, Rape, and Kick questionnaire    Fear of Current or Ex-Partner: No    Emotionally Abused: No    Physically Abused: No    Sexually Abused: No     Review of Systems    General:  No chills, fever, night sweats or weight changes.  Cardiovascular:  No chest pain, dyspnea on exertion, edema, orthopnea, palpitations, paroxysmal nocturnal dyspnea. Dermatological: No rash, lesions/masses Respiratory: No cough, dyspnea Urologic: No hematuria, dysuria Abdominal:   No nausea, vomiting, diarrhea, bright red blood per rectum, melena, or hematemesis Neurologic:  No visual changes, wkns, changes in mental status. All other systems reviewed and are otherwise negative except as noted above.  Physical Exam    VS:  BP 122/64   Pulse (!) 56   Ht 5\' 8"  (1.727 m)   Wt 197 lb (89.4 kg)   SpO2 100%   BMI 29.95 kg/m  , BMI Body mass index is 29.95 kg/m. GEN: Well nourished, well developed, in no acute distress. HEENT: normal. Neck: Supple, no JVD, carotid bruits, or masses. Cardiac: RRR, no murmurs, rubs, or gallops. No clubbing, cyanosis, edema.  Radials/DP/PT 2+ and equal bilaterally.  Respiratory:  Respirations regular and unlabored, clear to auscultation bilaterally. GI: Soft, nontender, nondistended, BS + x 4. MS: no deformity or atrophy. Skin: warm and dry, no rash. Neuro:  Strength and sensation are intact. Psych: Normal affect.  Accessory Clinical Findings    Recent Labs: 07/12/2022: B  Natriuretic Peptide 607.7; TSH 0.673 07/13/2022: ALT 28 07/15/2022: BUN 30; Creatinine, Ser 1.40; Hemoglobin 9.1; Magnesium 2.2; Platelets 112; Potassium 3.9; Sodium 136   Recent Lipid Panel    Component Value Date/Time   CHOL 127 04/28/2022 0431  TRIG 59 04/28/2022 0431   HDL 55 04/28/2022 0431   CHOLHDL 2.3 04/28/2022 0431   VLDL 12 04/28/2022 0431   LDLCALC 60 04/28/2022 0431         ECG personally reviewed by me today-none today.  Cardiac catheterization 11/16/2015  Mid RCA lesion, 100% stenosed. Ost 1st Diag to 1st Diag lesion, 70% stenosed. Mid LAD lesion, 100% stenosed. Dist RCA lesion, 100% stenosed. Dist Cx lesion, 60% stenosed.   Total occlusion of the proximal RCA. RCA is heavily calcified. RCA fills by collaterals from the circumflex coronary of the left system. The right coronary is large in distribution. Total occlusion of the proximal to mid LAD after the origin of the first of perforator and first diagonal. Left to left collateral supply the LAD and a second diagonal. The first diagonal contains eccentric 50-70% narrowing. Widely patent circumflex including a small ramus intermedius/first obtuse marginal, and a large branching second obtuse marginal. The distal circumflex beyond the second marginal is small and contains 50-70% narrowing. The circumflex is the source of collaterals to the distal right coronary. Mildly depressed LV function with estimated EF in the 35 to 40% range. LVEDP is mildly elevated. The inferior wall is hypokinetic.     RECOMMENDATIONS:   Images were reviewed with Dr. Martinique. The plan at this time is to continue medical therapy unless symptoms progress. Aggressive risk factor modification. Heart failure therapy as indicated.  Diagnostic Dominance: Right  Intervention   Echocardiogram 07/12/2022  IMPRESSIONS     1. Left ventricular ejection fraction, by estimation, is 30 to 35%. The  left ventricle has moderately decreased function.  The left ventricle  demonstrates regional wall motion abnormalities (see scoring  diagram/findings for description). There is mild  left ventricular hypertrophy. Left ventricular diastolic parameters are  consistent with Grade II diastolic dysfunction (pseudonormalization).  Elevated left atrial pressure.   2. Right ventricular systolic function is normal. The right ventricular  size is mildly enlarged. There is normal pulmonary artery systolic  pressure. The estimated right ventricular systolic pressure is [id number] mmHg.   3. Left atrial size was mildly dilated.   4. The mitral valve is normal in structure. Mild mitral valve  regurgitation.   5. The inferior vena cava is normal in size with greater than 50%  respiratory variability, suggesting right atrial pressure of 3 mmHg.   6. The aortic valve is calcified. Aortic valve regurgitation is not  visualized. Severe aortic valve stenosis. Moderate AS by gradients (MG 25  mmHg, Vmax 3.2 m/s), severe by AVA (0.9cm^2) and DI (0.25). Low SV index  (26 cc/m^2), suspect low flow low  gradient severe AS   FINDINGS   Left Ventricle: Left ventricular ejection fraction, by estimation, is 30  to 35%. The left ventricle has moderately decreased function. The left  ventricle demonstrates regional wall motion abnormalities. Definity  contrast agent was given IV to delineate  the left ventricular endocardial borders. The left ventricular internal  cavity size was normal in size. There is mild left ventricular  hypertrophy. Left ventricular diastolic parameters are consistent with  Grade II diastolic dysfunction  (pseudonormalization). Elevated left atrial pressure.     LV Wall Scoring:  The mid and distal anterior septum, mid and distal inferior wall, mid  inferoseptal segment, apical anterior segment, and apex are akinetic. The  anterior wall, entire lateral wall, basal anteroseptal segment, basal  inferior segment, and basal inferoseptal segment  are normal.   Right Ventricle: The right ventricular size is mildly  enlarged. No  increase in right ventricular wall thickness. Right ventricular systolic  function is normal. There is normal pulmonary artery systolic pressure.  The tricuspid regurgitant velocity is 2.72   m/s, and with an assumed right atrial pressure of 3 mmHg, the estimated  right ventricular systolic pressure is [id number] mmHg.   Left Atrium: Left atrial size was mildly dilated.   Right Atrium: Right atrial size was normal in size.   Pericardium: There is no evidence of pericardial effusion.   Mitral Valve: The mitral valve is normal in structure. Mild mitral valve  regurgitation.   Tricuspid Valve: The tricuspid valve is normal in structure. Tricuspid  valve regurgitation is trivial.   Aortic Valve: The aortic valve is calcified. Aortic valve regurgitation is  not visualized. Severe aortic stenosis is present. Aortic valve mean  gradient measures 25.0 mmHg. Aortic valve peak gradient measures 42.0  mmHg. Aortic valve area, by VTI measures   0.87 cm.   Pulmonic Valve: The pulmonic valve was not well visualized. Pulmonic valve  regurgitation is trivial.   Aorta: The aortic root and ascending aorta are structurally normal, with  no evidence of dilitation.   Venous: The inferior vena cava is normal in size with greater than 50%  respiratory variability, suggesting right atrial pressure of 3 mmHg.   IAS/Shunts: The interatrial septum was not well visualized.    Assessment & Plan   1.  Elevated troponin, CAD-no chest pain today.  Underwent cardiac catheterization 1992 and 2017 echocardiogram showed reduced LV function and G2 DD.  Details above.  Elevated troponins felt to be related to demand ischemia in the setting of urosepsis.  Has returned to all of his normal daily activities and denies chest pain. Continue atorvastatin, Toprol, nitroglycerin prn  Heart healthy low-sodium diet-salty 6 given Maintain  physical activity  Paroxysmal atrial fibrillation-heart rate today 54.  Reports compliance with Eliquis.  Denies bleeding issues.  Denies recent episodes of irregular or accelerated heart rate. Continue metoprolol, apixaban Avoid triggers caffeine, chocolate, EtOH, dehydration etc.  Ischemic cardiomyopathy-returning to baseline physical activity.  Echocardiogram showed reduced EF and moderate-severe AS.  He is not a TAVR candidate.  Limited in GDMT due to blood pressure. Continue furosemide, metoprolol Start losartan 12.5 mg daily Repeat BMP in 1-2 weeks Plan for repeat echocardiogram once GDMT has been optimized x 1 month  Essential hypertension-BP today 122/64. Has not yet taken b/p medication Continue metoprolol Maintain blood pressure log  Hyperlipidemia-LDL 60 on 04/28/22. Heart healthy low-sodium high-fiber diet Continue atorvastatin  AAA-denies recent episodes of back chest and abdominal discomfort.  Underwent endovascular repair 7/19.  Recent CT showed stable repair. Follows with Dr. Trula Slade  History of CVA-ambulating with walker today.  Strength upper and lower extremities equal bilaterally. Follows with PCP  Type 2 diabetes-glucose 105 on 07/15/2022. Continue carb modified diet Follows with PCP  Disposition: Follow-up with Dr. Martinique or me in 1-2 months.   Jossie Ng. Deshea Pooley NP-C     07/30/2022, 10:52 AM [place] [address] Office [phone] Fax [phone]    I spent 14 minutes examining this patient, reviewing medications, and using patient centered shared decision making involving her cardiac care.  Prior to her visit I spent greater than 20 minutes reviewing her past medical history,  medications, and prior cardiac tests.

## 2022-07-30 ENCOUNTER — Encounter: Payer: Self-pay | Admitting: General Practice

## 2022-07-30 ENCOUNTER — Ambulatory Visit: Payer: Medicare Other

## 2022-07-30 ENCOUNTER — Ambulatory Visit: Payer: Medicare Other | Attending: Cardiology | Admitting: General Practice

## 2022-07-30 VITALS — BP 122/64 | HR 56 | Ht 68.0 in | Wt 197.0 lb

## 2022-07-30 DIAGNOSIS — I255 Ischemic cardiomyopathy: Secondary | ICD-10-CM

## 2022-07-30 DIAGNOSIS — E7800 Pure hypercholesterolemia, unspecified: Secondary | ICD-10-CM

## 2022-07-30 DIAGNOSIS — I713 Abdominal aortic aneurysm, ruptured, unspecified: Secondary | ICD-10-CM

## 2022-07-30 DIAGNOSIS — I10 Essential (primary) hypertension: Secondary | ICD-10-CM

## 2022-07-30 DIAGNOSIS — I480 Paroxysmal atrial fibrillation: Secondary | ICD-10-CM | POA: Diagnosis not present

## 2022-07-30 DIAGNOSIS — R7989 Other specified abnormal findings of blood chemistry: Secondary | ICD-10-CM | POA: Diagnosis not present

## 2022-07-30 DIAGNOSIS — I2510 Atherosclerotic heart disease of native coronary artery without angina pectoris: Secondary | ICD-10-CM | POA: Diagnosis not present

## 2022-07-30 MED ORDER — LOSARTAN POTASSIUM 25 MG PO TABS: 12.5000 mg | ORAL_TABLET | Freq: Every day | ORAL | 6 refills | Status: DC

## 2022-07-30 NOTE — Patient Instructions (Signed)
Medication Instructions:  START LOSARTAN 12.5MG  (1/2 TAB) DAILY *If you need a refill on your cardiac medications before your next appointment, please call your pharmacy*  Lab Work: BMET IN 1-2 WEEKS AT PRIMARY MD  If you have labs (blood work) drawn today and your tests are completely normal, you will receive your results only by:  [place] (if you have MyChart) OR A paper copy in the mail If you have any lab test that is abnormal or we need to change your treatment, we will call you to review the results.  Testing/Procedures: NONE  Follow-Up: At [facility], you and your health needs are our priority.  As part of our continuing mission to provide you with exceptional heart care, we have created designated Provider Care Teams.  These Care Teams include your primary Cardiologist (physician) and Advanced Practice Providers (APPs -  Physician Assistants and Nurse Practitioners) who all work together to provide you with the care you need, when you need it.  Your next appointment:   1-2 month(s)  Provider:   Coletta Memos, FNP-C   Other Instructions CONTINUE PHYSICAL ACTIVITY  PLEASE READ AND FOLLOW ATTACHED  SALTY 6

## 2022-08-01 ENCOUNTER — Ambulatory Visit (HOSPITAL_COMMUNITY)
Admission: RE | Admit: 2022-08-01 | Discharge: 2022-08-01 | Disposition: A | Discharge status: Home / Self Care | Payer: Medicare Other | Source: Ambulatory Visit | Attending: Surgery | Admitting: Surgery

## 2022-08-01 DIAGNOSIS — I7143 Infrarenal abdominal aortic aneurysm, without rupture: Secondary | ICD-10-CM | POA: Diagnosis present

## 2022-08-01 MED ORDER — IOHEXOL 350 MG/ML SOLN
75.0000 mL | Freq: Once | INTRAVENOUS | Status: AC | PRN
  Administered 2022-08-01: 75 mL via INTRAVENOUS

## 2022-08-04 ENCOUNTER — Ambulatory Visit: Payer: Medicare Other | Admitting: Surgery

## 2022-08-04 ENCOUNTER — Ambulatory Visit: Payer: Medicare Other

## 2022-08-04 ENCOUNTER — Encounter: Payer: Self-pay | Admitting: Surgery

## 2022-08-04 VITALS — BP 129/80 | HR 75 | Temp 97.8°F | Resp 20 | Ht 68.0 in | Wt 191.5 lb

## 2022-08-04 DIAGNOSIS — I7143 Infrarenal abdominal aortic aneurysm, without rupture: Secondary | ICD-10-CM

## 2022-08-04 NOTE — Progress Notes (Signed)
[facility] [place]  Patient name: Jose Beck MRN: [id number] DOB: 08/26/1945 Sex: male   REASON FOR VISIT:    Follow up  HISOTRY OF PRESENT ILLNESS:    Jose Beck is a 77 y.o. male returns today for follow-up.  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  His postoperative course was uncomplicated.   He has been diagnosed with metastatic prostate cancer.  He has had multiple falls including a subdural hematoma.  The patient has a history of coronary artery disease.  He has known two-vessel occlusion with collaterals.  He has never undergone intervention.  He has a history of stroke in approximately 2017 while he was in [place].  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  At that time his carotid arteries were widely patent.  His residual deficits are some right-sided weakness and speech trouble.  He takes a statin for hypercholesterolemia.  He is on ARB for hypertension.  He is a non-smoker.  Since I have last seen him, he has been in the hospital twice, once for fall and another 4 urinary retention and UTI.  He now has a permanent indwelling Foley.  He is on anticoagulation for atrial fibrillation.   PAST MEDICAL HISTORY:   Past Medical History:  Diagnosis Date   Abdominal aortic aneurysm (AAA) ([place])    Aortic stenosis    a. 11/2015 Echo: EF 55-60%, Gr1 DD, mild AS.   Chronic combined systolic and diastolic CHF (congestive heart failure) ([place])    a. 07/2015: TEE showing EF of 40% b. 11/2015: Echo w/ EF 55-60%, Grade 1 DD, mild AS.   Coronary artery disease    a. patient reports 100% stenosis of LAD and RCA by cath in 1999. b. 11/2015 Cath: LM nl, LAD 100 - fills by L->L collats from D1, LCX 60d, OM1 lalrge/nl, OM2 small/nl, RCA 152m, L->R collats.EF 35-45% (55-60% by echo).   Diabetes mellitus without complication ([place])    Type II   GERD (gastroesophageal reflux  disease)    Hyperlipemia    Hypertension    Hypertensive heart disease    Stroke ([place])    a. 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   Thrombocytopenia ([place]) 11/2015     FAMILY HISTORY:   Family History  Problem Relation Age of Onset   Hypertension Mother    Heart disease Mother     SOCIAL HISTORY:   Social History   Tobacco Use   Smoking status: Never   Smokeless tobacco: Never  Substance Use Topics   Alcohol use: Yes    Alcohol/week: 1.0 standard drink of alcohol    Types: 1 Shots of liquor per week    Comment: cocktails every day     ALLERGIES:   Allergies  Allergen Reactions   Cheese Nausea And Vomiting   Pravastatin Anxiety    Anxiety attack     CURRENT MEDICATIONS:   Current Outpatient Medications  Medication Sig Dispense Refill   acetaminophen (TYLENOL) 650 MG CR tablet Take 650 mg by mouth in the morning, at noon, and at bedtime.     apixaban (ELIQUIS) 5 MG TABS tablet TAKE 1 TABLET BY MOUTH TWICE A DAY 60 tablet 5   atorvastatin (LIPITOR) 80 MG tablet Take 1 tablet (80 mg total) by mouth daily at 6 PM. 30 tablet 6   calcium-vitamin D 250-100 MG-UNIT tablet Take 1 tablet by mouth daily. Takes in morning 600mg   furosemide (LASIX) 40 MG tablet Take 20 mg by mouth daily.     insulin detemir (LEVEMIR) 100 UNIT/ML injection Inject 10 Units into the skin daily.     KLOR-CON M20 20 MEQ tablet Take 10 mEq by mouth daily.     losartan (COZAAR) 25 MG tablet Take 0.5 tablets (12.5 mg total) by mouth daily. 15 tablet 6   metoprolol succinate (TOPROL-XL) 25 MG 24 hr tablet Take 1 tablet (25 mg total) by mouth daily. PLEASE KEEP SCHEDULED APPOINTMENT WITH CARDIOLOGIST 60 tablet 0   nitroGLYCERIN (NITROSTAT) 0.4 MG SL tablet Place 0.4 mg under the tongue every 5 (five) minutes as needed for chest pain.     polyethylene glycol (MIRALAX / GLYCOLAX) 17 g packet Take 17 g by mouth daily as needed for mild constipation or moderate constipation. 14 each 0   tacrolimus  (PROTOPIC) 0.1 % ointment Apply 1 application topically daily as needed (facial scaling).     tamsulosin (FLOMAX) 0.4 MG CAPS capsule Take 0.4 mg by mouth daily.     No current facility-administered medications for this visit.    REVIEW OF SYSTEMS:   [X]  denotes positive finding, [ ]  denotes negative finding Cardiac  Comments:  Chest pain or chest pressure:    Shortness of breath upon exertion:    Short of breath when lying flat:    Irregular heart rhythm:        Vascular    Pain in calf, thigh, or hip brought on by ambulation:    Pain in feet at night that wakes you up from your sleep:     Blood clot in your veins:    Leg swelling:         Pulmonary    Oxygen at home:    Productive cough:     Wheezing:         Neurologic    Sudden weakness in arms or legs:     Sudden numbness in arms or legs:     Sudden onset of difficulty speaking or slurred speech:    Temporary loss of vision in one eye:     Problems with dizziness:         Gastrointestinal    Blood in stool:     Vomited blood:         Genitourinary    Burning when urinating:     Blood in urine:        Psychiatric    Major depression:         Hematologic    Bleeding problems:    Problems with blood clotting too easily:        Skin    Rashes or ulcers:        Constitutional    Fever or chills:      PHYSICAL EXAM:   Vitals:   08/04/22 1019  BP: 129/80  Pulse: 75  Resp: 20  Temp: 97.8 F (36.6 C)  SpO2: 99%  Weight: 191 lb 8 oz (86.9 kg)  Height: 5\' 8"  (1.727 m)    GENERAL: The patient is a well-nourished male, in no acute distress. The vital signs are documented above. CARDIAC: There is a regular rate and rhythm.  PULMONARY: Non-labored respirations ABDOMEN: Soft and non-tender  MUSCULOSKELETAL: There are no major deformities or cyanosis. NEUROLOGIC: No focal weakness or paresthesias are detected. SKIN: There are no ulcers or rashes noted. PSYCHIATRIC: The patient has a normal  affect.  STUDIES:   I have reviewed the following:  1. Patent infrarenal bifurcated stent graft with stable native aneurysm sac size, no evidence of endoleak. 2. 3 cm mixed lytic/sclerotic lesion in the left ilium, and lytic lesion in the inferior right pubic ramus, suggesting metastatic disease. 3. Stable retroperitoneal and pelvic adenopathy. 4. Hepatic cirrhosis with mild splenomegaly. 5. Moderate hiatal hernia. 6. Aortic Atherosclerosis (ICD10-I70.0).   MEDICAL ISSUES:   AAA: Maximum diameter 6.8 cm.  6 months ago measurement was 6.4 cm.  On today's scan, there is no evidence of endoleak, however I am still concerned about the proximal neck of the aneurysm.  There are 2 lumbar arteries up there as well as the possibility of aneurysmal degeneration above the graft.  Unfortunately, because of his recent hospitalization and ongoing medical issues including metastatic prostate cancer, I do not think he is a very good operative candidate at this time.  I plan on bring him back in 6 months and repeating his CT scan.  If it has gotten any bigger I will need to consider a proximal extension which will require dealing with his visceral vessels.    Leia Alf, MD, FACS [facility] [phone] Pager [phone]

## 2022-08-05 ENCOUNTER — Ambulatory Visit: Payer: Medicare Other

## 2022-08-05 DIAGNOSIS — M6281 Muscle weakness (generalized): Secondary | ICD-10-CM

## 2022-08-05 DIAGNOSIS — R2689 Other abnormalities of gait and mobility: Secondary | ICD-10-CM

## 2022-08-05 DIAGNOSIS — R2681 Unsteadiness on feet: Secondary | ICD-10-CM

## 2022-08-05 DIAGNOSIS — R262 Difficulty in walking, not elsewhere classified: Secondary | ICD-10-CM

## 2022-08-05 NOTE — Therapy (Signed)
OUTPATIENT PHYSICAL THERAPY NEURO TREATMENT    Patient Name: Jose Beck MRN: [id number] DOB:1946-01-14, 77 y.o., male Today's Date: 08/05/2022   PCP: [name], MD REFERRING PROVIDER: [name], MD    END OF SESSION:  PT End of Session - 08/05/22 1018     Visit Number 15    Number of Visits 19    Date for PT Re-Evaluation 08/25/22    Authorization Type [employer] Medicare    Progress Note Due on Visit 44    PT Start Time 1015    PT Stop Time 1100    PT Time Calculation (min) 45 min    Equipment Utilized During Treatment Gait belt    Activity Tolerance Patient tolerated treatment well    Behavior During Therapy WFL for tasks assessed/performed             Past Medical History:  Diagnosis Date   Abdominal aortic aneurysm (AAA) ([place])    Aortic stenosis    a. 11/2015 Echo: EF 55-60%, Gr1 DD, mild AS.   Chronic combined systolic and diastolic CHF (congestive heart failure) ([place])    a. 07/2015: TEE showing EF of 40% b. 11/2015: Echo w/ EF 55-60%, Grade 1 DD, mild AS.   Coronary artery disease    a. patient reports 100% stenosis of LAD and RCA by cath in 1999. b. 11/2015 Cath: LM nl, LAD 100 - fills by L->L collats from D1, LCX 60d, OM1 lalrge/nl, OM2 small/nl, RCA 130m, L->R collats.EF 35-45% (55-60% by echo).   Diabetes mellitus without complication ([place])    Type II   GERD (gastroesophageal reflux disease)    Hyperlipemia    Hypertension    Hypertensive heart disease    Stroke ([place])    a. 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   Thrombocytopenia ([place]) 11/2015   Past Surgical History:  Procedure Laterality Date   ABDOMINAL AORTIC ENDOVASCULAR STENT GRAFT N/A 11/25/2017   Procedure: ABDOMINAL AORTIC ENDOVASCULAR STENT GRAFT;  Surgeon: Serafina Mitchell, MD;  Location: [facility] OR;  Service: Vascular;  Laterality: N/A;   CARDIAC CATHETERIZATION     CARDIAC CATHETERIZATION N/A 11/16/2015   Procedure: Left Heart Cath and Coronary Angiography;   Surgeon: [name], MD;  Location: [facility];  Service: Cardiovascular;  Laterality: N/A;   MOLE REMOVAL     TONSILLECTOMY     Patient Active Problem List   Diagnosis Date Noted   Acute urinary retention 07/15/2022   Aortic valve stenosis, nonrheumatic 07/13/2022   NSVT (nonsustained ventricular tachycardia) ([place]) 07/13/2022   Sepsis secondary to UTI ([place]) [id number]   Acute systolic heart failure ([place]) 04/27/2022   Paroxysmal atrial fibrillation ([place]) 04/26/2022   Demand myocardial infarction ([place]) 04/26/2022   Demand ischemia 04/26/2022   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   SAH (subarachnoid hemorrhage) ([place]) 12/19/2021   Cerebrovascular accident ([place]) 01/04/2018   Compression fracture of thoracic vertebra ([place]) 01/04/2018   AAA (abdominal aortic aneurysm) ([place]) 11/25/2017   History of stroke 01/15/2017   Hyperlipidemia LDL goal <70 04/18/2016   Hypertension    Hypertensive heart disease    Chest pain 11/16/2015   Type 2 diabetes mellitus with circulatory disorder, with long-term current use of insulin ([place]) 11/16/2015   Essential hypertension 11/16/2015   Thrombocytopenia ([place]) 11/16/2015   Leukopenia 11/16/2015   Hyponatremia 11/16/2015   Chronic combined systolic and diastolic CHF (congestive heart failure) ([place]) 11/16/2015   CAD in native artery    Brainstem infarct, acute ([place]) 09/17/2015  Occlusion and stenosis of vertebral artery with cerebral infarction ([place]) 09/17/2015   Obesity 09/17/2015   Ataxia, 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 Recent fall on 05/17/22  REFERRING DIAG: I63.9 (ICD-10-CM) - Cerebral infarction, unspecified I69.959 (ICD-10-CM) - Hemiplegia and hemiparesis following unspecified cerebrovascular disease affecting unspecified side R27.0 (ICD-10-CM) - Ataxia, unspecified  THERAPY DIAG:  Muscle weakness (generalized)  Other  abnormalities of gait and mobility  Unsteadiness on feet  Difficulty in walking, not elsewhere classified  Rationale for Evaluation and Treatment: Rehabilitation  SUBJECTIVE:  Overall, feeling much better since hospitalization and now using foley catheter and leg bag.    Pt accompanied by: significant other "[name]"  PERTINENT HISTORY: past medical history of stroke, AAA, type 2 diabetes, hypertension, CAD who presents to the emergency department with fall.  PAIN:  Are you having pain? No  PRECAUTIONS: Fall  WEIGHT BEARING RESTRICTIONS: No  FALLS: Has patient fallen in last 6 months? Yes. Number of falls 5  LIVING ENVIRONMENT: Lives with: lives with their family and lives with their spouse Lives in: House/apartment, ground floor set-up Stairs: Yes, garage stairs into house, will install rail on left side Has following equipment at home: Gilford Rile - 2 wheeled  PLOF: Independent with household mobility with device, Needs assistance with ADLs, and Needs assistance with homemaking. Mostly able to dress self, needs help with Depends. Some assist with shower transfers (4" lip)  PATIENT GOALS:   OBJECTIVE:   TODAY'S TREATMENT: 08/05/22 Activity Comments  NU-step level 5 x 6 min for general conditioning   Seated hamstring curls  3x10, 2 sec hold 15#  Mini-squats 3x10 w/ vertical grab bar  Step-ups 2x5 w/ vertical grab bar, 8" box  Sidestepping x 2 min 5# at counter for limb advance and SLS  LAQ 3x10 5#, cues for full ROM  Standing on foam -EO x 30 sec -EC 5x10 sec -head turns EO/EC -postural perturbations x 30 sec --completed as such 2 rounds  Gastroc stretch x 2 min slantboard     TODAY'S TREATMENT: 07/28/22 Activity Comments  Hip add iso 3x10  LAQ 3x10 4#  Sidestepping x 2 min 4#   Standing hamstring curls 2x10 4#  Standing on foam -EO x 30 sec -EC 3x10 sec -head turns EO/EC --completed as such 2 rounds  NU-step level 3 x 8 min  LE only for general conditioning        HOME EXERCISE PROGRAM: Access Code: [email or web address] URL: [email or web address] Date: 07/07/2022 Prepared by: [place] with Counter Support  - 1 x daily - 7 x weekly - 3 sets - 10 reps - Side Stepping with Resistance at Thighs and Counter Support  - 1 x daily - 7 x weekly - 3 sets - 2 min hold - Standing March with Counter Support  - 1 x daily - 7 x weekly - 3 sets - 10 reps - Seated Long Arc Quad with Ankle Weight  - 1 x daily - 7 x weekly - 3 sets - 10 reps - Standing Hip Abduction with Ankle Weight  - 1 x daily - 7 x weekly - 3 sets - 10 reps - Standing Alternating Knee Flexion with Ankle Weights  - 1 x daily - 7 x weekly - 3 sets - 10 reps ---------------------------------------- From eval: DIAGNOSTIC FINDINGS: CT head and C-spine which were both negative.  COGNITION: Overall cognitive status:  Within functional limits for tasks assessed and has some aphasia from hx of CVA   SENSATION: Not tested notes numbness along RUE  COORDINATION: Grossly impaired RUE/RLE Inability to perform rapid alternating movements, heel to shin RLE. Unable to perform finger to nose RUE due to deficits/issues  EDEMA:    MUSCLE TONE: NT   DTRs:  NT  POSTURE: rounded shoulders and forward head  LOWER EXTREMITY ROM:     Active  Right Eval Left Eval  Hip flexion    Hip extension    Hip abduction    Hip adduction    Hip internal rotation    Hip external rotation    Knee flexion    Knee extension    Ankle dorsiflexion 10 15  Ankle plantarflexion    Ankle inversion    Ankle eversion     (Blank rows = not tested)  LOWER EXTREMITY MMT:    Grossly 4/5 BLE  BED MOBILITY:  NT  TRANSFERS: Assistive device utilized: Environmental consultant - 2 wheeled and None  Sit to stand: Complete Independence and Modified independence Stand to sit: Complete Independence and Modified independence Chair to chair: Complete Independence and Modified independence Floor:   NT  RAMP:  NT  CURB:  Level of Assistance: CGA and Min A Assistive device utilized: Environmental consultant - 2 wheeled Curb Comments:   STAIRS: Level of Assistance: Min A Stair Negotiation Technique: Step to Pattern with Bilateral Rails Number of Stairs: 5  Height of Stairs: 4-6"  Comments: right Trendelenberg very prominent with stairs and profound difficulty achieving foot clearance in descending  GAIT: Gait pattern: step to pattern and shuffling Distance walked:  Assistive device utilized: Environmental consultant - 2 wheeled Level of assistance: SBA Comments:   FUNCTIONAL TESTS:  5 times sit to stand: 16.47 sec Timed up and go (TUG): 57.43 sec 10 meter walk test: NT Berg Balance Scale: NT  PATIENT SURVEYS:  N/A     PATIENT EDUCATION: Education details: assessment findings, rationale for PT intervention Person educated: Patient Education method: Explanation Education comprehension: verbalized understanding    GOALS: Goals reviewed with patient? Yes  SHORT TERM GOALS: Target date: 06/24/2022    Patient will be independent in HEP to improve functional outcomes Baseline: Goal status: MET  2.  Demo reduced risk for falls and improved BLE strength per time 15 sec 5xSTS Baseline: 16 sec; (06/23/22) 13.75 sec minimal LUE use Goal status: MET  3.  Improve independence with stair ambulation at SBA-CGA using HR on left with or without AD to improve safety with home entry/exit Baseline: min A; (06/23/22) SBA-CGA Goal status: MET  4. Patient to be independent in advanced HEP to include strength, balance, and ambulation bouts.  Baseline:  Goal status: INITIAL  LONG TERM GOALS: Target date: 07/28/2022   Demo reduced risk for falls per TUG test 35 sec w/ RW Baseline: 57 sec; (06/23/22) 35 sec Goal status: REVISED  2.  Demo improved efficiency of gait per speed of 2.5 ft/sec 10 meter walk test Baseline: 0.48 ft/sec Goal status: IN PROGRESS  3.  Improve safety with mobility per supervision  performance with stair ambulation and curb negotiation Baseline: min A w/ RW; (06/23/22) SBA-CGA Goal status: IN PROGRESS  4. Report/demonstrate ability to ambulate household distances w/ cane to meet patient goal  Baseline: unable  Goal status: IN PROGRESS   ASSESSMENT:  CLINICAL IMPRESSION: Pt and spouse endorse feeling medically improved. Impetus on strength and single limb support in today's session to promote/facilitate RLE support and  power for ascending high stair step and arising from low seat surfaces.  25% for RLE facilitation in step-ups. Improved stability on compliant surface with eyes closed demo mild-moderate sway x 10 sec. Continued sessions to advance POC details  OBJECTIVE IMPAIRMENTS: Abnormal gait, decreased activity tolerance, decreased balance, decreased coordination, decreased knowledge of use of DME, decreased mobility, difficulty walking, decreased strength, impaired tone, and impaired UE functional use.   ACTIVITY LIMITATIONS: carrying, lifting, bending, stairs, transfers, reach over head, and locomotion level  PARTICIPATION LIMITATIONS: meal prep, cleaning, laundry, interpersonal relationship, and community activity  PERSONAL FACTORS: Age, Time since onset of injury/illness/exacerbation, and 3+ comorbidities: CVA, prostate CA, compression fx  are also affecting patient's functional outcome.   REHAB POTENTIAL: Good  CLINICAL DECISION MAKING: Evolving/moderate complexity  EVALUATION COMPLEXITY: Moderate  PLAN:  PT FREQUENCY: 2x/wk x 5 wks  PT DURATION: other: 5 wks--10 sessions  PLANNED INTERVENTIONS: Therapeutic exercises, Therapeutic activity, Neuromuscular re-education, Balance training, Gait training, Patient/Family education, Self Care, Joint mobilization, Stair training, Vestibular training, Canalith repositioning, Orthotic/Fit training, DME instructions, Aquatic Therapy, Dry Needling, Spinal mobilization, Cryotherapy, Moist heat, and Manual  therapy  PLAN FOR NEXT SESSION: step height/length (T-band assisted?)   10:19 AM, 08/05/22 M. Sherlyn Lees, PT, DPT Physical Therapist- [place] Office Number: [phone]   [place] [facility] [address] [place], [place] [address] Phone # [phone] Fax # [phone]

## 2022-08-06 ENCOUNTER — Ambulatory Visit: Payer: Medicare Other

## 2022-08-06 NOTE — Therapy (Signed)
OUTPATIENT PHYSICAL THERAPY NEURO TREATMENT    Patient Name: Jose Beck MRN: [id number] DOB:[birth year], 77 y.o., male Today's Date: 08/07/2022   PCP: [name], MD REFERRING PROVIDER: [name], MD    END OF SESSION:  PT End of Session - 08/07/22 1716     Visit Number 16    Number of Visits 19    Date for PT Re-Evaluation 08/25/22    Authorization Type [employer] Medicare    Progress Note Due on Visit 37    PT Start Time 1405    PT Stop Time 1449    PT Time Calculation (min) 44 min    Equipment Utilized During Treatment Gait belt    Activity Tolerance Patient tolerated treatment well    Behavior During Therapy WFL for tasks assessed/performed              Past Medical History:  Diagnosis Date   Abdominal aortic aneurysm (AAA) ([place])    Aortic stenosis    a. 11/2015 Echo: EF 55-60%, Gr1 DD, mild AS.   Chronic combined systolic and diastolic CHF (congestive heart failure) ([place])    a. 07/2015: TEE showing EF of 40% b. 11/2015: Echo w/ EF 55-60%, Grade 1 DD, mild AS.   Coronary artery disease    a. patient reports 100% stenosis of LAD and RCA by cath in 1999. b. 11/2015 Cath: LM nl, LAD 100 - fills by L->L collats from D1, LCX 60d, OM1 lalrge/nl, OM2 small/nl, RCA 121m, L->R collats.EF 35-45% (55-60% by echo).   Diabetes mellitus without complication ([place])    Type II   GERD (gastroesophageal reflux disease)    Hyperlipemia    Hypertension    Hypertensive heart disease    Stroke ([place])    a. 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   Thrombocytopenia ([place]) 11/2015   Past Surgical History:  Procedure Laterality Date   ABDOMINAL AORTIC ENDOVASCULAR STENT GRAFT N/A 11/25/2017   Procedure: ABDOMINAL AORTIC ENDOVASCULAR STENT GRAFT;  Surgeon: Serafina Mitchell, MD;  Location: [facility];  Service: Vascular;  Laterality: N/A;   CARDIAC CATHETERIZATION     CARDIAC CATHETERIZATION N/A 11/16/2015   Procedure: Left Heart Cath and Coronary Angiography;   Surgeon: [name], MD;  Location: [facility];  Service: Cardiovascular;  Laterality: N/A;   MOLE REMOVAL     TONSILLECTOMY     Patient Active Problem List   Diagnosis Date Noted   Acute urinary retention 07/15/2022   Aortic valve stenosis, nonrheumatic 07/13/2022   NSVT (nonsustained ventricular tachycardia) ([place]) 07/13/2022   Sepsis secondary to UTI ([place]) [id number]   Acute systolic heart failure ([place]) 04/27/2022   Paroxysmal atrial fibrillation ([place]) 04/26/2022   Demand myocardial infarction ([place]) 04/26/2022   Demand ischemia 04/26/2022   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   SAH (subarachnoid hemorrhage) ([place]) 12/19/2021   Cerebrovascular accident ([place]) 01/04/2018   Compression fracture of thoracic vertebra ([place]) 01/04/2018   AAA (abdominal aortic aneurysm) ([place]) 11/25/2017   History of stroke 01/15/2017   Hyperlipidemia LDL goal <70 04/18/2016   Hypertension    Hypertensive heart disease    Chest pain 11/16/2015   Type 2 diabetes mellitus with circulatory disorder, with long-term current use of insulin ([place]) 11/16/2015   Essential hypertension 11/16/2015   Thrombocytopenia ([place]) 11/16/2015   Leukopenia 11/16/2015   Hyponatremia 11/16/2015   Chronic combined systolic and diastolic CHF (congestive heart failure) ([place]) 11/16/2015   CAD in native artery    Brainstem infarct, acute ([place]) 09/17/2015  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 Recent fall on 05/17/22  REFERRING DIAG: I63.9 (ICD-10-CM) - Cerebral infarction, unspecified I69.959 (ICD-10-CM) - Hemiplegia and hemiparesis following unspecified cerebrovascular disease affecting unspecified side R27.0 (ICD-10-CM) - Ataxia, unspecified  THERAPY DIAG:  Muscle weakness (generalized)  Other  abnormalities of gait and mobility  Unsteadiness on feet  Difficulty in walking, not elsewhere classified  Rationale for Evaluation and Treatment: Rehabilitation  SUBJECTIVE:  No recent falls.     Pt accompanied by: significant other "[name]"  PERTINENT HISTORY: past medical history of stroke, AAA, type 2 diabetes, hypertension, CAD who presents to the emergency department with fall.  PAIN:  Are you having pain? No  PRECAUTIONS: Fall  WEIGHT BEARING RESTRICTIONS: No  FALLS: Has patient fallen in last 6 months? Yes. Number of falls 5  LIVING ENVIRONMENT: Lives with: lives with their family and lives with their spouse Lives in: House/apartment, ground floor set-up Stairs: Yes, garage stairs into house, will install rail on left side Has following equipment at home: Gilford Rile - 2 wheeled  PLOF: Independent with household mobility with device, Needs assistance with ADLs, and Needs assistance with homemaking. Mostly able to dress self, needs help with Depends. Some assist with shower transfers (4" lip)  PATIENT GOALS:   OBJECTIVE:     TODAY'S TREATMENT: 08/07/22 Activity Comments  Nustep L5 x 6 min Les/L UE  Small amplitude motion evident; cueing to increase amplitude   STS 2x10 Cueing to decrease speed and stand fully upright   gait training with RW with TB under forefoot, x behind knee, then attaching to gait belt x150 total in 3 trials Cueing to increase R step heigh and B step length; good ability to maneuver around obstacles. Cueing for max safety with transfers d/t patient nearly missing mat upon sitting down  forward stepping to target on floor 10x Cueing to slow down  alt sidestepping at [place]: Access Code: [email or web address] URL: [email or web address] Date: 07/07/2022 Prepared by: [place] with Counter Support  - 1 x daily - 7 x weekly - 3 sets - 10 reps - Side Stepping with Resistance at Thighs and  Counter Support  - 1 x daily - 7 x weekly - 3 sets - 2 min hold - Standing March with Counter Support  - 1 x daily - 7 x weekly - 3 sets - 10 reps - Seated Long Arc Quad with Ankle Weight  - 1 x daily - 7 x weekly - 3 sets - 10 reps - Standing Hip Abduction with Ankle Weight  - 1 x daily - 7 x weekly - 3 sets - 10 reps - Standing Alternating Knee Flexion with Ankle Weights  - 1 x daily - 7 x weekly - 3 sets - 10 reps ---------------------------------------- From eval: DIAGNOSTIC FINDINGS: CT head and C-spine which were both negative.  COGNITION: Overall cognitive status: Within functional limits for tasks assessed and has some aphasia from hx of CVA   SENSATION: Not tested notes numbness along RUE  COORDINATION: Grossly impaired RUE/RLE Inability to perform rapid alternating movements, heel to shin RLE. Unable to perform finger to nose RUE due to deficits/issues  EDEMA:    MUSCLE TONE: NT   DTRs:  NT  POSTURE: rounded shoulders and forward head  LOWER EXTREMITY ROM:     Active  Right Eval Left Eval  Hip flexion    Hip extension    Hip abduction    Hip adduction    Hip internal rotation    Hip external rotation    Knee flexion    Knee extension    Ankle dorsiflexion 10 15  Ankle plantarflexion    Ankle inversion    Ankle eversion     (Blank rows = not tested)  LOWER EXTREMITY MMT:    Grossly 4/5 BLE  BED MOBILITY:  NT  TRANSFERS: Assistive device utilized: Environmental consultant - 2 wheeled and None  Sit to stand: Complete Independence and Modified independence Stand to sit: Complete Independence and Modified independence Chair to chair: Complete Independence and Modified independence Floor:  NT  RAMP:  NT  CURB:  Level of Assistance: CGA and Min A Assistive device utilized: Environmental consultant - 2 wheeled Curb Comments:   STAIRS: Level of Assistance: Min A Stair Negotiation Technique: Step to Pattern with Bilateral Rails Number of Stairs: 5  Height of Stairs:  4-6"  Comments: right Trendelenberg very prominent with stairs and profound difficulty achieving foot clearance in descending  GAIT: Gait pattern: step to pattern and shuffling Distance walked:  Assistive device utilized: Environmental consultant - 2 wheeled Level of assistance: SBA Comments:   FUNCTIONAL TESTS:  5 times sit to stand: 16.47 sec Timed up and go (TUG): 57.43 sec 10 meter walk test: NT Berg Balance Scale: NT  PATIENT SURVEYS:  N/A     PATIENT EDUCATION: Education details: assessment findings, rationale for PT intervention Person educated: Patient Education method: Explanation Education comprehension: verbalized understanding    GOALS: Goals reviewed with patient? Yes  SHORT TERM GOALS: Target date: 06/24/2022    Patient will be independent in HEP to improve functional outcomes Baseline: Goal status: MET  2.  Demo reduced risk for falls and improved BLE strength per time 15 sec 5xSTS Baseline: 16 sec; (06/23/22) 13.75 sec minimal LUE use Goal status: MET  3.  Improve independence with stair ambulation at SBA-CGA using HR on left with or without AD to improve safety with home entry/exit Baseline: min A; (06/23/22) SBA-CGA Goal status: MET  4. Patient to be independent in advanced HEP to include strength, balance, and ambulation bouts.  Baseline:  Goal status: INITIAL  LONG TERM GOALS: Target date: 07/28/2022   Demo reduced risk for falls per TUG test 35 sec w/ RW Baseline: 57 sec; (06/23/22) 35 sec Goal status: REVISED  2.  Demo improved efficiency of gait per speed of 2.5 ft/sec 10 meter walk test Baseline: 0.48 ft/sec Goal status: IN PROGRESS  3.  Improve safety with mobility per supervision performance with stair ambulation and curb negotiation Baseline: min A w/ RW; (06/23/22) SBA-CGA Goal status: IN PROGRESS  4. Report/demonstrate ability to ambulate household distances w/ cane to meet patient goal  Baseline: unable  Goal status: IN  PROGRESS   ASSESSMENT:  CLINICAL IMPRESSION: Patient arrived to session without new complaints. Upon observation, R dorsal hand bruised- patient reports this is from IVs in the hospital. Demonstrated improved step height with use of TB around the R ankle and knee. Difficulty increasing step length otherwise with just cues. Patient with small bit of blood evident in R nostril mid-session. Proceeded with stepping strategy activities with cueing to elongate steps. Required use of w/c to exit session d/t fatigue and to ensure safety. No other complaints upon  leaving.   OBJECTIVE IMPAIRMENTS: Abnormal gait, decreased activity tolerance, decreased balance, decreased coordination, decreased knowledge of use of DME, decreased mobility, difficulty walking, decreased strength, impaired tone, and impaired UE functional use.   ACTIVITY LIMITATIONS: carrying, lifting, bending, stairs, transfers, reach over head, and locomotion level  PARTICIPATION LIMITATIONS: meal prep, cleaning, laundry, interpersonal relationship, and community activity  PERSONAL FACTORS: Age, Time since onset of injury/illness/exacerbation, and 3+ comorbidities: CVA, prostate CA, compression fx  are also affecting patient's functional outcome.   REHAB POTENTIAL: Good  CLINICAL DECISION MAKING: Evolving/moderate complexity  EVALUATION COMPLEXITY: Moderate  PLAN:  PT FREQUENCY: 2x/wk x 5 wks  PT DURATION: other: 5 wks--10 sessions  PLANNED INTERVENTIONS: Therapeutic exercises, Therapeutic activity, Neuromuscular re-education, Balance training, Gait training, Patient/Family education, Self Care, Joint mobilization, Stair training, Vestibular training, Canalith repositioning, Orthotic/Fit training, DME instructions, Aquatic Therapy, Dry Needling, Spinal mobilization, Cryotherapy, Moist heat, and Manual therapy  PLAN FOR NEXT SESSION: step height/length (T-band assisted?)   Janene Harvey, PT, DPT 08/07/22 5:18 PM  [facility] [address] [place], [place] [address] Phone # [phone] Fax # [phone]

## 2022-08-07 ENCOUNTER — Ambulatory Visit: Payer: Medicare Other | Admitting: Physical Therapy

## 2022-08-07 ENCOUNTER — Encounter: Payer: Self-pay | Admitting: Physical Therapy

## 2022-08-07 DIAGNOSIS — R262 Difficulty in walking, not elsewhere classified: Secondary | ICD-10-CM

## 2022-08-07 DIAGNOSIS — R2689 Other abnormalities of gait and mobility: Secondary | ICD-10-CM

## 2022-08-07 DIAGNOSIS — R2681 Unsteadiness on feet: Secondary | ICD-10-CM

## 2022-08-07 DIAGNOSIS — M6281 Muscle weakness (generalized): Secondary | ICD-10-CM | POA: Diagnosis not present

## 2022-08-08 ENCOUNTER — Encounter: Payer: Self-pay | Admitting: Cardiology

## 2022-08-11 ENCOUNTER — Ambulatory Visit: Payer: Medicare Other

## 2022-08-11 ENCOUNTER — Other Ambulatory Visit: Payer: Self-pay | Admitting: Cardiology

## 2022-08-11 LAB — HEMOGLOBIN A1C: A1c: 6

## 2022-08-12 ENCOUNTER — Ambulatory Visit: Payer: Medicare Other | Attending: Internal Medicine

## 2022-08-12 DIAGNOSIS — R262 Difficulty in walking, not elsewhere classified: Secondary | ICD-10-CM | POA: Diagnosis present

## 2022-08-12 DIAGNOSIS — R2689 Other abnormalities of gait and mobility: Secondary | ICD-10-CM | POA: Diagnosis present

## 2022-08-12 DIAGNOSIS — M6281 Muscle weakness (generalized): Secondary | ICD-10-CM | POA: Diagnosis not present

## 2022-08-12 DIAGNOSIS — R2681 Unsteadiness on feet: Secondary | ICD-10-CM

## 2022-08-12 NOTE — Therapy (Signed)
OUTPATIENT PHYSICAL THERAPY NEURO TREATMENT    Patient Name: Jose Beck MRN: [id number] DOB:1946-04-21, 77 y.o., male Today's Date: 08/12/2022   PCP: [name], MD REFERRING PROVIDER: [name], MD    END OF SESSION:  PT End of Session - 08/12/22 1450     Visit Number 17    Number of Visits 19    Date for PT Re-Evaluation 08/25/22    Authorization Type [employer] Medicare    Progress Note Due on Visit 59    PT Start Time 1445    PT Stop Time 1530    PT Time Calculation (min) 45 min    Equipment Utilized During Treatment Gait belt    Activity Tolerance Patient tolerated treatment well    Behavior During Therapy WFL for tasks assessed/performed              Past Medical History:  Diagnosis Date   Abdominal aortic aneurysm (AAA) ([place])    Aortic stenosis    a. 11/2015 Echo: EF 55-60%, Gr1 DD, mild AS.   Chronic combined systolic and diastolic CHF (congestive heart failure) ([place])    a. 07/2015: TEE showing EF of 40% b. 11/2015: Echo w/ EF 55-60%, Grade 1 DD, mild AS.   Coronary artery disease    a. patient reports 100% stenosis of LAD and RCA by cath in 1999. b. 11/2015 Cath: LM nl, LAD 100 - fills by L->L collats from D1, LCX 60d, OM1 lalrge/nl, OM2 small/nl, RCA 161m, L->R collats.EF 35-45% (55-60% by echo).   Diabetes mellitus without complication ([place])    Type II   GERD (gastroesophageal reflux disease)    Hyperlipemia    Hypertension    Hypertensive heart disease    Stroke ([place])    a. 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   Thrombocytopenia ([place]) 11/2015   Past Surgical History:  Procedure Laterality Date   ABDOMINAL AORTIC ENDOVASCULAR STENT GRAFT N/A 11/25/2017   Procedure: ABDOMINAL AORTIC ENDOVASCULAR STENT GRAFT;  Surgeon: Serafina Mitchell, MD;  Location: [facility] OR;  Service: Vascular;  Laterality: N/A;   CARDIAC CATHETERIZATION     CARDIAC CATHETERIZATION N/A 11/16/2015   Procedure: Left Heart Cath and Coronary Angiography;   Surgeon: [name], MD;  Location: [facility];  Service: Cardiovascular;  Laterality: N/A;   MOLE REMOVAL     TONSILLECTOMY     Patient Active Problem List   Diagnosis Date Noted   Acute urinary retention 07/15/2022   Aortic valve stenosis, nonrheumatic 07/13/2022   NSVT (nonsustained ventricular tachycardia) 07/13/2022   Sepsis secondary to UTI [id number]   Acute systolic heart failure [id number]   Paroxysmal atrial fibrillation 04/26/2022   Demand myocardial infarction 04/26/2022   Demand ischemia 04/26/2022   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   SAH (subarachnoid hemorrhage) 12/19/2021   Cerebrovascular accident 01/04/2018   Compression fracture of thoracic vertebra 01/04/2018   AAA (abdominal aortic aneurysm) 11/25/2017   History of stroke 01/15/2017   Hyperlipidemia LDL goal <70 04/18/2016   Hypertension    Hypertensive heart disease    Chest pain 11/16/2015   Type 2 diabetes mellitus with circulatory disorder, with long-term current use of insulin 11/16/2015   Essential hypertension 11/16/2015   Thrombocytopenia 11/16/2015   Leukopenia 11/16/2015   Hyponatremia 11/16/2015   Chronic combined systolic and diastolic CHF (congestive heart failure) 11/16/2015   CAD in native artery    Brainstem infarct, acute 09/17/2015   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  PAIN:  Are you having pain? No  PRECAUTIONS: Fall  WEIGHT BEARING RESTRICTIONS: No  FALLS: Has patient fallen in last 6 months? Yes. Number of falls 5  LIVING ENVIRONMENT: Lives with: lives with their family and lives with their spouse Lives in: House/apartment, ground floor set-up Stairs: Yes, garage stairs into house, will install rail on left side Has following equipment at home: Gilford Rile - 2 wheeled  PLOF: Independent with household mobility with device, Needs assistance with ADLs, and Needs assistance with homemaking. Mostly able to dress self, needs help with Depends. Some assist with shower transfers (4" lip)  PATIENT GOALS:   OBJECTIVE:   TODAY'S TREATMENT: 08/12/22 Activity Comments  NU-step level 5 x 10 min Cues for large amplitude and SPM for fast, reciprocal movement  Pre-gait -step height for RLE--knee drive w/ 5# -step length for RLE using targets on ground  -reciprocal step length using foot targets on ground -static stride stance balance EO/EC 15 sec   Gait training Use of floor dots to promote increased stride and step length to minimize step-to pattern                   HOME EXERCISE PROGRAM: Access Code: [email or web address] URL: [email or web address] Date: 07/07/2022 Prepared by: [place] with Counter Support  - 1 x daily - 7 x weekly - 3 sets - 10 reps - Side Stepping with Resistance at Thighs and Counter Support  - 1 x daily - 7 x weekly - 3 sets - 2 min hold - Standing March with Counter Support  - 1 x daily - 7 x weekly - 3 sets - 10 reps - Seated Long Arc Quad with Ankle Weight  - 1 x daily - 7 x weekly - 3 sets -  10 reps - Standing Hip Abduction with Ankle Weight  - 1 x daily - 7 x weekly - 3 sets - 10 reps - Standing Alternating Knee Flexion with Ankle Weights  - 1 x daily - 7 x weekly - 3 sets - 10 reps ---------------------------------------- From eval: DIAGNOSTIC FINDINGS: CT head and C-spine which were both negative.  COGNITION: Overall cognitive status: Within functional limits for tasks assessed and has some aphasia from hx of CVA   SENSATION: Not tested notes numbness along RUE  COORDINATION: Grossly impaired RUE/RLE Inability to perform rapid alternating movements, heel to shin RLE. Unable to perform finger to nose RUE due to deficits/issues  EDEMA:    MUSCLE TONE: NT   DTRs:  NT  POSTURE: rounded shoulders and forward head  LOWER EXTREMITY ROM:     Active  Right Eval Left Eval  Hip flexion    Hip extension    Hip abduction  Hip adduction    Hip internal rotation    Hip external rotation    Knee flexion    Knee extension    Ankle dorsiflexion 10 15  Ankle plantarflexion    Ankle inversion    Ankle eversion     (Blank rows = not tested)  LOWER EXTREMITY MMT:    Grossly 4/5 BLE  BED MOBILITY:  NT  TRANSFERS: Assistive device utilized: Environmental consultant - 2 wheeled and None  Sit to stand: Complete Independence and Modified independence Stand to sit: Complete Independence and Modified independence Chair to chair: Complete Independence and Modified independence Floor:  NT  RAMP:  NT  CURB:  Level of Assistance: CGA and Min A Assistive device utilized: Environmental consultant - 2 wheeled Curb Comments:   STAIRS: Level of Assistance: Min A Stair Negotiation Technique: Step to Pattern with Bilateral Rails Number of Stairs: 5  Height of Stairs: 4-6"  Comments: right Trendelenberg very prominent with stairs and profound difficulty achieving foot clearance in descending  GAIT: Gait pattern: step to pattern and shuffling Distance walked:  Assistive device utilized: Environmental consultant - 2  wheeled Level of assistance: SBA Comments:   FUNCTIONAL TESTS:  5 times sit to stand: 16.47 sec Timed up and go (TUG): 57.43 sec 10 meter walk test: NT Berg Balance Scale: NT  PATIENT SURVEYS:  N/A     PATIENT EDUCATION: Education details: assessment findings, rationale for PT intervention Person educated: Patient Education method: Explanation Education comprehension: verbalized understanding    GOALS: Goals reviewed with patient? Yes  SHORT TERM GOALS: Target date: 06/24/2022    Patient will be independent in HEP to improve functional outcomes Baseline: Goal status: MET  2.  Demo reduced risk for falls and improved BLE strength per time 15 sec 5xSTS Baseline: 16 sec; (06/23/22) 13.75 sec minimal LUE use Goal status: MET  3.  Improve independence with stair ambulation at SBA-CGA using HR on left with or without AD to improve safety with home entry/exit Baseline: min A; (06/23/22) SBA-CGA Goal status: MET  4. Patient to be independent in advanced HEP to include strength, balance, and ambulation bouts.  Baseline:  Goal status: INITIAL  LONG TERM GOALS: Target date: 07/28/2022   Demo reduced risk for falls per TUG test 35 sec w/ RW Baseline: 57 sec; (06/23/22) 35 sec Goal status: REVISED  2.  Demo improved efficiency of gait per speed of 2.5 ft/sec 10 meter walk test Baseline: 0.48 ft/sec Goal status: IN PROGRESS  3.  Improve safety with mobility per supervision performance with stair ambulation and curb negotiation Baseline: min A w/ RW; (06/23/22) SBA-CGA Goal status: IN PROGRESS  4. Report/demonstrate ability to ambulate household distances w/ cane to meet patient goal  Baseline: unable  Goal status: IN PROGRESS   ASSESSMENT:  CLINICAL IMPRESSION: Activities devised to attempt to improve step height and length to promote even stride to reduce step-to pattern and improve LE power for limb advancement.  Unfortunately limited carryover for step through  length was achieved with these tactics. Continues to exhibit reduced activity tolerance since his recent hospitalization requiring increased therapeutic rest periods during session and requires wheelchair to exit clinic at end of session due to fatigue.   OBJECTIVE IMPAIRMENTS: Abnormal gait, decreased activity tolerance, decreased balance, decreased coordination, decreased knowledge of use of DME, decreased mobility, difficulty walking, decreased strength, impaired tone, and impaired UE functional use.   ACTIVITY LIMITATIONS: carrying, lifting, bending, stairs, transfers, reach over head, and locomotion level  PARTICIPATION LIMITATIONS: meal prep,  cleaning, laundry, interpersonal relationship, and community activity  PERSONAL FACTORS: Age, Time since onset of injury/illness/exacerbation, and 3+ comorbidities: CVA, prostate CA, compression fx  are also affecting patient's functional outcome.   REHAB POTENTIAL: Good  CLINICAL DECISION MAKING: Evolving/moderate complexity  EVALUATION COMPLEXITY: Moderate  PLAN:  PT FREQUENCY: 2x/wk x 5 wks  PT DURATION: other: 5 wks--10 sessions  PLANNED INTERVENTIONS: Therapeutic exercises, Therapeutic activity, Neuromuscular re-education, Balance training, Gait training, Patient/Family education, Self Care, Joint mobilization, Stair training, Vestibular training, Canalith repositioning, Orthotic/Fit training, DME instructions, Aquatic Therapy, Dry Needling, Spinal mobilization, Cryotherapy, Moist heat, and Manual therapy  PLAN FOR NEXT SESSION: additional step length drills?   4:01 PM, 08/12/22 M. Sherlyn Lees, PT, DPT Physical Therapist- [place] Office Number: [phone]

## 2022-08-14 ENCOUNTER — Ambulatory Visit: Payer: Medicare Other

## 2022-08-15 ENCOUNTER — Ambulatory Visit: Payer: Medicare Other | Admitting: Physical Therapy

## 2022-08-15 ENCOUNTER — Encounter: Payer: Self-pay | Admitting: Physical Therapy

## 2022-08-15 DIAGNOSIS — M6281 Muscle weakness (generalized): Secondary | ICD-10-CM

## 2022-08-15 DIAGNOSIS — R2681 Unsteadiness on feet: Secondary | ICD-10-CM

## 2022-08-15 DIAGNOSIS — R2689 Other abnormalities of gait and mobility: Secondary | ICD-10-CM

## 2022-08-15 NOTE — Therapy (Signed)
OUTPATIENT PHYSICAL THERAPY NEURO TREATMENT    Patient Name: Jose Beck MRN: [id number] DOB:1945/12/18, 77 y.o., male Today's Date: 08/15/2022   PCP: Geoffry ParadiseAronson, Richard, MD REFERRING PROVIDER: Geoffry ParadiseAronson, Richard, MD    END OF SESSION:  PT End of Session - 08/15/22 1013     Visit Number 18    Number of Visits 19    Date for PT Re-Evaluation 08/25/22    Authorization Type [employer]    Progress Note Due on Visit 19    PT Start Time 1015    PT Stop Time 1100    PT Time Calculation (min) 45 min    Equipment Utilized During Treatment Gait belt    Activity Tolerance Patient tolerated treatment well    Behavior During Therapy WFL for tasks assessed/performed              Past Medical History:  Diagnosis Date   Abdominal aortic aneurysm (AAA)    Aortic stenosis    a. 11/2015 Echo: EF 55-60%, Gr1 DD, mild AS.   Chronic combined systolic and diastolic CHF (congestive heart failure)    a. 07/2015: TEE showing EF of 40% b. 11/2015: Echo w/ EF 55-60%, Grade 1 DD, mild AS.   Coronary artery disease    a. patient reports 100% stenosis of LAD and RCA by cath in 1999. b. 11/2015 Cath: LM nl, LAD 100 - fills by L->L collats from D1, LCX 60d, OM1 lalrge/nl, OM2 small/nl, RCA 14825m, L->R collats.EF 35-45% (55-60% by echo).   Diabetes mellitus without complication    Type II   GERD (gastroesophageal reflux disease)    Hyperlipemia    Hypertension    Hypertensive heart disease    Stroke    a. 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   Thrombocytopenia 11/2015   Past Surgical History:  Procedure Laterality Date   ABDOMINAL AORTIC ENDOVASCULAR STENT GRAFT N/A 11/25/2017   Procedure: ABDOMINAL AORTIC ENDOVASCULAR STENT GRAFT;  Surgeon: Nada LibmanBrabham, Vance W, MD;  Location: [facility] OR;  Service: Vascular;  Laterality: N/A;   CARDIAC CATHETERIZATION     CARDIAC CATHETERIZATION N/A 11/16/2015   Procedure: Left Heart Cath and Coronary Angiography;  Surgeon: Lyn RecordsHenry W Smith, MD;   [facility];  Service: Cardiovascular;  Laterality: N/A;   MOLE REMOVAL     TONSILLECTOMY     Patient Active Problem List   Diagnosis Date Noted   Acute urinary retention 07/15/2022   Aortic valve stenosis, nonrheumatic 07/13/2022   NSVT (nonsustained ventricular tachycardia) 07/13/2022   Sepsis secondary to UTI 07/12/2022   Acute systolic heart failure 04/27/2022   Paroxysmal atrial fibrillation 04/26/2022   Demand myocardial infarction 04/26/2022   Demand ischemia 04/26/2022   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   SAH (subarachnoid hemorrhage) 12/19/2021   Cerebrovascular accident 01/04/2018   Compression fracture of thoracic vertebra 01/04/2018   AAA (abdominal aortic aneurysm) 11/25/2017   History of stroke 01/15/2017   Hyperlipidemia LDL goal <70 04/18/2016   Hypertension    Hypertensive heart disease    Chest pain 11/16/2015   Type 2 diabetes mellitus with circulatory disorder, with long-term current use of insulin 11/16/2015   Essential hypertension 11/16/2015   Thrombocytopenia 11/16/2015   Leukopenia 11/16/2015   Hyponatremia 11/16/2015   Chronic combined systolic and diastolic CHF (congestive heart failure) 11/16/2015   CAD in native artery    Brainstem infarct, acute 09/17/2015   Occlusion and stenosis of vertebral artery with cerebral infarction 09/17/2015   Obesity 09/17/2015  Ataxia, 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  Wife reports need some more appts.    Pt accompanied by: significant other "[name]"  PERTINENT HISTORY: past medical history of stroke, AAA, type 2 diabetes, hypertension, CAD who presents to the emergency department with fall.  PAIN:  Are you having pain? No just typical pain and difficulty moving R shoulder  PRECAUTIONS: Fall  WEIGHT BEARING RESTRICTIONS: No  FALLS: Has patient fallen in last 6 months? Yes. Number of falls 5  LIVING ENVIRONMENT: Lives with: lives with their family and lives with their spouse Lives in: House/apartment, ground floor set-up Stairs: Yes, garage stairs into house, will install rail on left side Has following equipment at home: Dan Humphreys - 2 wheeled  PLOF: Independent with household mobility with device, Needs assistance with ADLs, and Needs assistance with homemaking. Mostly able to dress self, needs help with Depends. Some assist with shower transfers (4" lip)  PATIENT GOALS:   OBJECTIVE:    TODAY'S TREATMENT: 08/15/2022 Activity Comments  NU-step level 5 x 10 min, BLEs and LUE Self-selected speed is 70's; able to keep SPM >80-90 1 minute intervals  Vitals after Nustep:  97% O2 and 69 HR   Forward/back walking x 2 minutes at parallel bars Decreased RLE step length  RLE hip extension step taps x 10 reps, then forward/back step taps RLE x 10 reps   Stagger stance forward/back rocking 2 x 10 reps   Marching in place with target for high knee lifts 2 x 10 reps   Attempted floor ladder negotiation with RW; ladder is too wide; pt takes step-to pattern   Gait with RW x 120 ft, then 40 ft Wide BOS, decreased RLE step length  Standing at RW:  RLE hip abduction x 10 reps Cues for larger step length out to side        HOME EXERCISE PROGRAM: Access Code: [email or web address] URL: [email or web address] Date: 07/07/2022 Prepared by: Shary Decamp  Exercises - Mini Squat with Counter Support  - 1 x daily - 7  x weekly - 3 sets - 10 reps - Side Stepping with Resistance at Thighs and Counter Support  - 1 x daily - 7 x weekly - 3 sets - 2 min hold - Standing March with Counter Support  - 1 x daily - 7 x weekly - 3 sets - 10 reps - Seated Long Arc Quad with Ankle Weight  - 1 x daily - 7 x weekly - 3 sets - 10 reps - Standing Hip Abduction with Ankle Weight  - 1 x daily - 7 x weekly - 3 sets - 10 reps - Standing Alternating Knee Flexion with Ankle Weights  - 1 x daily - 7 x weekly - 3 sets - 10 reps ---------------------------------------- From eval: DIAGNOSTIC FINDINGS: CT head and C-spine which were both negative.  COGNITION: Overall cognitive status: Within functional limits for tasks assessed and has some aphasia from hx of CVA   SENSATION: Not tested notes numbness along RUE  COORDINATION: Grossly impaired RUE/RLE Inability to perform  rapid alternating movements, heel to shin RLE. Unable to perform finger to nose RUE due to deficits/issues  EDEMA:    MUSCLE TONE: NT   DTRs:  NT  POSTURE: rounded shoulders and forward head  LOWER EXTREMITY ROM:     Active  Right Eval Left Eval  Hip flexion    Hip extension    Hip abduction    Hip adduction    Hip internal rotation    Hip external rotation    Knee flexion    Knee extension    Ankle dorsiflexion 10 15  Ankle plantarflexion    Ankle inversion    Ankle eversion     (Blank rows = not tested)  LOWER EXTREMITY MMT:    Grossly 4/5 BLE  BED MOBILITY:  NT  TRANSFERS: Assistive device utilized: Environmental consultant - 2 wheeled and None  Sit to stand: Complete Independence and Modified independence Stand to sit: Complete Independence and Modified independence Chair to chair: Complete Independence and Modified independence Floor:  NT  RAMP:  NT  CURB:  Level of Assistance: CGA and Min A Assistive device utilized: Environmental consultant - 2 wheeled Curb Comments:   STAIRS: Level of Assistance: Min A Stair Negotiation Technique: Step to Pattern  with Bilateral Rails Number of Stairs: 5  Height of Stairs: 4-6"  Comments: right Trendelenberg very prominent with stairs and profound difficulty achieving foot clearance in descending  GAIT: Gait pattern: step to pattern and shuffling Distance walked:  Assistive device utilized: Environmental consultant - 2 wheeled Level of assistance: SBA Comments:   FUNCTIONAL TESTS:  5 times sit to stand: 16.47 sec Timed up and go (TUG): 57.43 sec 10 meter walk test: NT Berg Balance Scale: NT  PATIENT SURVEYS:  N/A     PATIENT EDUCATION: Education details: assessment findings, rationale for PT intervention Person educated: Patient Education method: Explanation Education comprehension: verbalized understanding    GOALS: Goals reviewed with patient? Yes  SHORT TERM GOALS: Target date: 06/24/2022    Patient will be independent in HEP to improve functional outcomes Baseline: Goal status: MET  2.  Demo reduced risk for falls and improved BLE strength per time 15 sec 5xSTS Baseline: 16 sec; (06/23/22) 13.75 sec minimal LUE use Goal status: MET  3.  Improve independence with stair ambulation at SBA-CGA using HR on left with or without AD to improve safety with home entry/exit Baseline: min A; (06/23/22) SBA-CGA Goal status: MET  4. Patient to be independent in advanced HEP to include strength, balance, and ambulation bouts.  Baseline:  Goal status: INITIAL  LONG TERM GOALS: Target date: 07/28/2022>08/25/2022 (Per recert 07/28/2022)   Demo reduced risk for falls per TUG test 35 sec w/ RW Baseline: 57 sec; (06/23/22) 35 sec Goal status: REVISED  2.  Demo improved efficiency of gait per speed of 2.5 ft/sec 10 meter walk test Baseline: 0.48 ft/sec Goal status: IN PROGRESS  3.  Improve safety with mobility per supervision performance with stair ambulation and curb negotiation Baseline: min A w/ RW; (06/23/22) SBA-CGA Goal status: IN PROGRESS  4. Report/demonstrate ability to ambulate household  distances w/ cane to meet patient goal  Baseline: unable  Goal status: IN PROGRESS   ASSESSMENT:  CLINICAL IMPRESSION: Skilled PT session today focused on functional lower extremity strengthening and pre-gait/gait activities for increased step length.  He continues to have decreased step length on RLE and wider BOS during gait activities.  With hip extension, weightshifting, and hip abduction tasks, he initially generates large movement patterns, which decreased  over the course of a 10 rep set.  He will continue to benefit from skilled PT towards goals for improved functional mobility and decreased fall risk. OBJECTIVE IMPAIRMENTS: Abnormal gait, decreased activity tolerance, decreased balance, decreased coordination, decreased knowledge of use of DME, decreased mobility, difficulty walking, decreased strength, impaired tone, and impaired UE functional use.   ACTIVITY LIMITATIONS: carrying, lifting, bending, stairs, transfers, reach over head, and locomotion level  PARTICIPATION LIMITATIONS: meal prep, cleaning, laundry, interpersonal relationship, and community activity  PERSONAL FACTORS: Age, Time since onset of injury/illness/exacerbation, and 3+ comorbidities: CVA, prostate CA, compression fx  are also affecting patient's functional outcome.   REHAB POTENTIAL: Good  CLINICAL DECISION MAKING: Evolving/moderate complexity  EVALUATION COMPLEXITY: Moderate  PLAN:  PT FREQUENCY: 2x/wk x 5 wks  PT DURATION: other: 5 wks--10 sessions  PLANNED INTERVENTIONS: Therapeutic exercises, Therapeutic activity, Neuromuscular re-education, Balance training, Gait training, Patient/Family education, Self Care, Joint mobilization, Stair training, Vestibular training, Canalith repositioning, Orthotic/Fit training, DME instructions, Aquatic Therapy, Dry Needling, Spinal mobilization, Cryotherapy, Moist heat, and Manual therapy  PLAN FOR NEXT SESSION: Will need to begin to assess LTGs and discuss POC (pt  only has appts scheduled through next week).  additional step length drills?; make additions to HEP?   Lonia Blood, PT 08/15/22 12:14 PM Phone: [phone] Fax: [phone]  [facility] at [facility] [address] [place], [address] [place], [place] [address] Phone # [phone] Fax # [phone]

## 2022-08-18 ENCOUNTER — Ambulatory Visit: Payer: Medicare Other

## 2022-08-18 ENCOUNTER — Ambulatory Visit
Admission: RE | Admit: 2022-08-18 | Discharge: 2022-08-18 | Disposition: A | Discharge status: Home / Self Care | Payer: Medicare Other | Source: Ambulatory Visit | Attending: Diagnostic Radiology | Admitting: Diagnostic Radiology

## 2022-08-18 DIAGNOSIS — I9789 Other postprocedural complications and disorders of the circulatory system, not elsewhere classified: Secondary | ICD-10-CM

## 2022-08-18 NOTE — Progress Notes (Signed)
Patient ID: Jose Beck, male   DOB: Nov 07, 1945, 77 y.o.   MRN: [id number] Follow up visit at IR clinic was cancelled because the aortic aneurysm is being managed by Dr. Myra Gianotti.

## 2022-08-20 ENCOUNTER — Ambulatory Visit: Payer: Medicare Other | Admitting: Physical Therapy

## 2022-08-22 NOTE — Therapy (Signed)
OUTPATIENT PHYSICAL THERAPY NEURO TREATMENT    Patient Name: Jose Beck MRN: [id number] DOB:01-26-1946, 77 y.o., male Today's Date: 08/25/2022   PCP: Geoffry Paradise, MD REFERRING PROVIDER: Geoffry Paradise, MD    END OF SESSION:  PT End of Session - 08/25/22 1705     Visit Number 19    Number of Visits 19    Date for PT Re-Evaluation 08/25/22    Authorization Type [employer]    Progress Note Due on Visit 19    PT Start Time 1618    PT Stop Time 1704    PT Time Calculation (min) 46 min    Equipment Utilized During Treatment Gait belt    Activity Tolerance Patient tolerated treatment well    Behavior During Therapy WFL for tasks assessed/performed               Past Medical History:  Diagnosis Date   Abdominal aortic aneurysm (AAA)    Aortic stenosis    a. 11/2015 Echo: EF 55-60%, Gr1 DD, mild AS.   Chronic combined systolic and diastolic CHF (congestive heart failure)    a. 07/2015: TEE showing EF of 40% b. 11/2015: Echo w/ EF 55-60%, Grade 1 DD, mild AS.   Coronary artery disease    a. patient reports 100% stenosis of LAD and RCA by cath in 1999. b. 11/2015 Cath: LM nl, LAD 100 - fills by L->L collats from D1, LCX 60d, OM1 lalrge/nl, OM2 small/nl, RCA 167m, L->R collats.EF 35-45% (55-60% by echo).   Diabetes mellitus without complication    Type II   GERD (gastroesophageal reflux disease)    Hyperlipemia    Hypertension    Hypertensive heart disease    Stroke    a. 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   Thrombocytopenia 11/2015   Past Surgical History:  Procedure Laterality Date   ABDOMINAL AORTIC ENDOVASCULAR STENT GRAFT N/A 11/25/2017   Procedure: ABDOMINAL AORTIC ENDOVASCULAR STENT GRAFT;  Surgeon: Nada Libman, MD;  Location: [facility] OR;  Service: Vascular;  Laterality: N/A;   CARDIAC CATHETERIZATION     CARDIAC CATHETERIZATION N/A 11/16/2015   Procedure: Left Heart Cath and Coronary Angiography;  Surgeon: Lyn Records, MD;   Location: [facility] INVASIVE CV LAB;  Service: Cardiovascular;  Laterality: N/A;   MOLE REMOVAL     TONSILLECTOMY     Patient Active Problem List   Diagnosis Date Noted   Acute urinary retention 07/15/2022   Aortic valve stenosis, nonrheumatic 07/13/2022   NSVT (nonsustained ventricular tachycardia) 07/13/2022   Sepsis secondary to UTI 07/12/2022   Acute systolic heart failure 04/27/2022   Paroxysmal atrial fibrillation 04/26/2022   Demand myocardial infarction 04/26/2022   Demand ischemia 04/26/2022   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   SAH (subarachnoid hemorrhage) 12/19/2021   Cerebrovascular accident 01/04/2018   Compression fracture of thoracic vertebra 01/04/2018   AAA (abdominal aortic aneurysm) 11/25/2017   History of stroke 01/15/2017   Hyperlipidemia LDL goal <70 04/18/2016   Hypertension    Hypertensive heart disease    Chest pain 11/16/2015   Type 2 diabetes mellitus with circulatory disorder, with long-term current use of insulin 11/16/2015   Essential hypertension 11/16/2015   Thrombocytopenia 11/16/2015   Leukopenia 11/16/2015   Hyponatremia 11/16/2015   Chronic combined systolic and diastolic CHF (congestive heart failure) 11/16/2015   CAD in native artery    Brainstem infarct, acute 09/17/2015   Occlusion and stenosis of vertebral artery with cerebral infarction 09/17/2015   Obesity 09/17/2015  Ataxia, 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 Wife reports that pt has not fallen since using the walker, so has not transitioned back to cane.     Pt accompanied by: significant other "[name]"  PERTINENT HISTORY: past medical history of stroke, AAA, type 2 diabetes, hypertension, CAD who presents to the emergency department with fall.  PAIN:  Are you having pain? Pt unable to describe but notes R shoulder "is always bothering me."  PRECAUTIONS: Fall  WEIGHT BEARING RESTRICTIONS: No  FALLS: Has patient fallen in last 6 months? Yes. Number of falls 5  LIVING ENVIRONMENT: Lives with: lives with their family and lives with their spouse Lives in: House/apartment, ground floor set-up Stairs: Yes, garage stairs into house, will install rail on left side Has following equipment at home: Dan Humphreys - 2 wheeled  PLOF: Independent with household mobility with device, Needs assistance with ADLs, and Needs assistance with homemaking. Mostly able to dress self, needs help with Depends. Some assist with shower transfers (4" lip)  PATIENT GOALS:   OBJECTIVE:     TODAY'S TREATMENT: 08/25/22 Activity Comments  46m walk 30.29 sec with RW; 1.08 ft/sec  TUG 42.01 sec with RW; good safety with RW  Stair navigation 2x Ascending/descending step through with 1 UE support - this was unsteady. Then performed the way pt does at home- step-to ascending and backwards descending with 1 handrail which was more steady; required CGA-minA               PATIENT EDUCATION: Education details: edu on progress towards and remaining impairments; edu on "yoga for brain injury" classes or seated stretching class that wife mentioned to help take place of therapy; POC- wife agreeable to 30 day hold at this time  Person educated: Patient and Spouse Education method:  Explanation, Demonstration, Tactile cues, and Verbal cues Education comprehension: verbalized understanding and returned demonstration   HOME EXERCISE PROGRAM: Access Code: [email or web address] URL: [email or web address] Date: 07/07/2022 Prepared by: Shary Decamp  Exercises - Mini Squat with Counter Support  - 1 x daily - 7 x weekly - 3 sets - 10 reps - Side Stepping with Resistance at Thighs and Counter Support  - 1 x daily - 7 x weekly - 3 sets - 2 min hold - Standing March with Counter Support  - 1 x daily - 7 x weekly - 3 sets - 10 reps - Seated Long Arc Quad with Ankle Weight  - 1 x daily - 7 x weekly - 3 sets - 10 reps - Standing Hip Abduction with Ankle Weight  - 1 x daily - 7 x weekly - 3 sets - 10 reps - Standing Alternating Knee Flexion with Ankle Weights  - 1 x daily - 7 x weekly - 3 sets - 10 reps   ---------------------------------------- From eval: DIAGNOSTIC FINDINGS: CT head and C-spine which were both negative.  COGNITION: Overall  cognitive status: Within functional limits for tasks assessed and has some aphasia from hx of CVA   SENSATION: Not tested notes numbness along RUE  COORDINATION: Grossly impaired RUE/RLE Inability to perform rapid alternating movements, heel to shin RLE. Unable to perform finger to nose RUE due to deficits/issues  EDEMA:    MUSCLE TONE: NT   DTRs:  NT  POSTURE: rounded shoulders and forward head  LOWER EXTREMITY ROM:     Active  Right Eval Left Eval  Hip flexion    Hip extension    Hip abduction    Hip adduction    Hip internal rotation    Hip external rotation    Knee flexion    Knee extension    Ankle dorsiflexion 10 15  Ankle plantarflexion    Ankle inversion    Ankle eversion     (Blank rows = not tested)  LOWER EXTREMITY MMT:    Grossly 4/5 BLE  BED MOBILITY:  NT  TRANSFERS: Assistive device utilized: Environmental consultant - 2 wheeled and None  Sit to stand: Complete Independence and Modified  independence Stand to sit: Complete Independence and Modified independence Chair to chair: Complete Independence and Modified independence Floor:  NT  RAMP:  NT  CURB:  Level of Assistance: CGA and Min A Assistive device utilized: Environmental consultant - 2 wheeled Curb Comments:   STAIRS: Level of Assistance: Min A Stair Negotiation Technique: Step to Pattern with Bilateral Rails Number of Stairs: 5  Height of Stairs: 4-6"  Comments: right Trendelenberg very prominent with stairs and profound difficulty achieving foot clearance in descending  GAIT: Gait pattern: step to pattern and shuffling Distance walked:  Assistive device utilized: Environmental consultant - 2 wheeled Level of assistance: SBA Comments:   FUNCTIONAL TESTS:  5 times sit to stand: 16.47 sec Timed up and go (TUG): 57.43 sec 10 meter walk test: NT Berg Balance Scale: NT  PATIENT SURVEYS:  N/A     PATIENT EDUCATION: Education details: assessment findings, rationale for PT intervention Person educated: Patient Education method: Explanation Education comprehension: verbalized understanding    GOALS: Goals reviewed with patient? Yes  SHORT TERM GOALS: Target date: 06/24/2022    Patient will be independent in HEP to improve functional outcomes Baseline: Goal status: MET  2.  Demo reduced risk for falls and improved BLE strength per time 15 sec 5xSTS Baseline: 16 sec; (06/23/22) 13.75 sec minimal LUE use Goal status: MET  3.  Improve independence with stair ambulation at SBA-CGA using HR on left with or without AD to improve safety with home entry/exit Baseline: min A; (06/23/22) SBA-CGA Goal status: MET  4. Patient to be independent in advanced HEP to include strength, balance, and ambulation bouts.  Baseline: wife reports consistency with HEP  Goal status: MET 08/25/22  LONG TERM GOALS: Target date: 07/28/2022>08/25/2022 (Per recert 07/28/2022)   Demo reduced risk for falls per TUG test 35 sec w/ RW Baseline: 57 sec;  (06/23/22) 35 sec; 42.01 sec 08/25/22 Goal status: IN PROGRESS 08/25/22  2.  Demo improved efficiency of gait per speed of 2.5 ft/sec 10 meter walk test Baseline: 0.48 ft/sec; 1/08 ft/sec 08/25/22 Goal status: IN PROGRESS 08/25/22  3.  Improve safety with mobility per supervision performance with stair ambulation and curb negotiation Baseline: min A w/ RW; (06/23/22) SBA-CGA; CGA today 08/25/22 Goal status: IN PROGRESS 08/25/22  4. Report/demonstrate ability to ambulate household distances w/ cane to meet patient goal  Baseline: unable; not doing- pt most safe with RW  Goal status: DEFERRED   ASSESSMENT:  CLINICAL IMPRESSION: Patient arrived to session with wife without complaints. Wife reports that patient has not fallen since using this walker, thus goal to transition to cane was deferred for safety. Patient was able to increase TUG gait speed and self-selected speed today. Stairs required min A initially, however on 2nd trial patient was able to replicate sequencing which he does at home and only required CGA. Discussed other fitness options such as seated exercise classes to provide some more variety- patient and wife report that they will look into these. Both agreeable to 30 day hold at this time.  OBJECTIVE IMPAIRMENTS: Abnormal gait, decreased activity tolerance, decreased balance, decreased coordination, decreased knowledge of use of DME, decreased mobility, difficulty walking, decreased strength, impaired tone, and impaired UE functional use.   ACTIVITY LIMITATIONS: carrying, lifting, bending, stairs, transfers, reach over head, and locomotion level  PARTICIPATION LIMITATIONS: meal prep, cleaning, laundry, interpersonal relationship, and community activity  PERSONAL FACTORS: Age, Time since onset of injury/illness/exacerbation, and 3+ comorbidities: CVA, prostate CA, compression fx  are also affecting patient's functional outcome.   REHAB POTENTIAL: Good  CLINICAL DECISION MAKING:  Evolving/moderate complexity  EVALUATION COMPLEXITY: Moderate  PLAN:  PT FREQUENCY: 2x/wk x 5 wks  PT DURATION: other: 5 wks--10 sessions  PLANNED INTERVENTIONS: Therapeutic exercises, Therapeutic activity, Neuromuscular re-education, Balance training, Gait training, Patient/Family education, Self Care, Joint mobilization, Stair training, Vestibular training, Canalith repositioning, Orthotic/Fit training, DME instructions, Aquatic Therapy, Dry Needling, Spinal mobilization, Cryotherapy, Moist heat, and Manual therapy  PLAN FOR NEXT SESSION: 30 day hold at this time  Anette Guarneri, PT, DPT 08/25/22 5:10 PM  [facility] [address] [place], [place] [address] Phone # [phone] Fax # [phone]

## 2022-08-25 ENCOUNTER — Ambulatory Visit: Payer: Medicare Other | Admitting: Physical Therapy

## 2022-08-25 ENCOUNTER — Encounter: Payer: Self-pay | Admitting: Physical Therapy

## 2022-08-25 DIAGNOSIS — R2689 Other abnormalities of gait and mobility: Secondary | ICD-10-CM

## 2022-08-25 DIAGNOSIS — R262 Difficulty in walking, not elsewhere classified: Secondary | ICD-10-CM

## 2022-08-25 DIAGNOSIS — M6281 Muscle weakness (generalized): Secondary | ICD-10-CM

## 2022-08-25 DIAGNOSIS — R2681 Unsteadiness on feet: Secondary | ICD-10-CM

## 2022-09-03 NOTE — Progress Notes (Signed)
Cardiology Clinic Note   Patient Name: Jose Beck Date of Encounter: 09/05/2022  Primary Care Provider:  Geoffry Paradise, MD Primary Cardiologist:  Peter Swaziland, MD  Patient Profile    Jose Beck is to the clinic today for follow-up evaluation of his coronary artery disease and hypertension.  Past Medical History    Past Medical History:  Diagnosis Date   Abdominal aortic aneurysm (AAA) (HCC)    Aortic stenosis    a. 11/2015 Echo: EF 55-60%, Gr1 DD, mild AS.   Chronic combined systolic and diastolic CHF (congestive heart failure) (HCC)    a. 07/2015: TEE showing EF of 40% b. 11/2015: Echo w/ EF 55-60%, Grade 1 DD, mild AS.   Coronary artery disease    a. patient reports 100% stenosis of LAD and RCA by cath in 1999. b. 11/2015 Cath: LM nl, LAD 100 - fills by L->L collats from D1, LCX 60d, OM1 lalrge/nl, OM2 small/nl, RCA 156m, L->R collats.EF 35-45% (55-60% by echo).   Diabetes mellitus without complication (HCC)    Type II   GERD (gastroesophageal reflux disease)    Hyperlipemia    Hypertension    Hypertensive heart disease    Stroke (HCC)    a. 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   Thrombocytopenia (HCC) 11/2015   Past Surgical History:  Procedure Laterality Date   ABDOMINAL AORTIC ENDOVASCULAR STENT GRAFT N/A 11/25/2017   Procedure: ABDOMINAL AORTIC ENDOVASCULAR STENT GRAFT;  Surgeon: Nada Libman, MD;  Location: [facility] OR;  Service: Vascular;  Laterality: N/A;   CARDIAC CATHETERIZATION     CARDIAC CATHETERIZATION N/A 11/16/2015   Procedure: Left Heart Cath and Coronary Angiography;  Surgeon: Lyn Records, MD;  Location: [facility] INVASIVE CV LAB;  Service: Cardiovascular;  Laterality: N/A;   MOLE REMOVAL     TONSILLECTOMY      Allergies  Allergies  Allergen Reactions   Cheese Nausea And Vomiting   Pravastatin Anxiety    Anxiety attack    History of Present Illness    Jose Beck has a PMH of cerebral infarct, type 2 diabetes, HTN,  CAD, HLD, AAA, chronic combined systolic and diastolic CHF, GERD demand ischemia, PVCs, NSVT, obesity, and thrombocytopenia.  He underwent cardiac catheterization with PCI to his LAD and RCA in 1992.  He had subsequent cath and 2017 and was noted to have collateral flow.  (Mid RCA 100%, first diagonal 70%, mid LAD 100% distal RCA 100%, distal circumflex 60% he is status post AAA repair which was complicated by endovascular leak.    He presented to the emergency department on 07/12/2022 and was discharged on 07/15/2022.  He complained of chills, nausea, vomiting, and shakiness.  He reported symptoms for 1 week.  In the emergency department he was noted to have leukocytosis and elevated creatinine at 1.8.  His baseline creatinine is around 1.1.  His lactate was noted to be 1.8.  CT of his abdomen and pelvis showed moderate hydronephrosis bilaterally.  No obstructing calculi were noted.  His bladder was distended.  He was noted to have prostate enlargement suggesting bladder outlet obstruction.  He was also noted to have multiple enlarged lymph nodes in the retroperitoneum and left pelvic sidewall.  He was noted to have aortic atherosclerosis with aneurysmal dilation of the abdominal aorta measuring up to 6.3 cm with endograft in place.  There were no significant changes from his previous exam.  He was admitted for evaluation and treatment.  He underwent Foley  placement and was placed on IV Rocephin.  His urine culture showed E. coli.  He was given instructions to complete course of ciprofloxacin, continue his Flomax and follow-up with urology.  It was recommended that due to his hydronephrosis and bladder, his Foley catheter should stay in place for at least 1 week with urology follow-up as an outpatient.  He was noted to have elevated troponin.  Cardiology was consulted.  It was felt that his elevated troponins were due to demand ischemia.  Echocardiogram 07/12/2022 showed an LVEF of 30-35%, G2 DD, mildly dilated left  atria, mild mitral valve regurgitation, calcified aortic valve with moderate aortic stenosis, suspect low-flow low gradient severe AS.  Not a candidate for TAVR.  He denied chest pain.  His Eliquis and metoprolol were continued for his paroxysmal atrial fibrillation.  He presented to the clinic 07/30/22 for follow-up evaluation and stated he continued to do his physical therapy exercises daily.  He also did memory puzzles and other exercises for memory post CVA.  He had been using 5 pound ankle weights.  We reviewed his recent hospitalization and medications.  He and his wife expressed understanding.  He presented to the clinic  using a walker.  Due to his reduced EF I started losartan 12.5 mg daily in the afternoon.  He planned to see his PCP Dr. Lorain Childes at beginning of April.  Planned follow-up in 1 month.  He presents to the clinic today for follow-up evaluation with his wife.  They have been keeping a blood pressure log.  He notes blood pressures in the 100 teens over 60s.  Reviewed his medications.  He is not a candidate for SGLT2 inhibitor due to history of urinary tract infection.  His blood pressure today is 112/60.  He denies bleeding issues and reports compliance on apixaban.  I will continue daily weights and his current medication regimen.  We will plan for repeat echocardiogram 7/24 and follow-up with Dr. Swaziland after the echocardiogram.  Today denies chest pain, shortness of breath, lower extremity edema, fatigue, palpitations, melena, hematuria, hemoptysis, diaphoresis, weakness, presyncope, syncope, orthopnea, and PND.     Home Medications    Prior to Admission medications   Medication Sig Start Date End Date Taking? Authorizing Provider  acetaminophen (TYLENOL) 650 MG CR tablet Take 650 mg by mouth in the morning, at noon, and at bedtime.    [provider]  apixaban (ELIQUIS) 5 MG TABS tablet TAKE 1 TABLET BY MOUTH TWICE A DAY 06/10/22   Swaziland, Peter M, MD  atorvastatin  (LIPITOR) 80 MG tablet Take 1 tablet (80 mg total) by mouth daily at 6 PM. 04/25/16   [place]iland, Peter M, MD  calcium-vitamin D 250-100 MG-UNIT tablet Take 1 tablet by mouth daily. Takes in morning 600mg     [provider]  furosemide (LASIX) 40 MG tablet Take 20 mg by mouth daily. 06/26/22   [provider]  insulin detemir (LEVEMIR) 100 UNIT/ML injection Inject 10 Units into the skin daily.    [provider]  KLOR-CON M20 20 MEQ tablet Take 10 mEq by mouth daily. 06/26/22   [provider]  metoprolol succinate (TOPROL-XL) 25 MG 24 hr tablet Take 1 tablet (25 mg total) by mouth daily. PLEASE KEEP SCHEDULED APPOINTMENT WITH CARDIOLOGIST 06/20/22   [place], Peter M, MD  nitroGLYCERIN (NITROSTAT) 0.4 MG SL tablet Place 0.4 mg under the tongue every 5 (five) minutes as needed for chest pain.    [provider]  polyethylene glycol (  MIRALAX / GLYCOLAX) 17 g packet Take 17 g by mouth daily as needed for mild constipation or moderate constipation. 07/15/22   Pokhrel, Rebekah Chesterfield, MD  tacrolimus (PROTOPIC) 0.1 % ointment Apply 1 application topically daily as needed (facial scaling).    [provider]  tamsulosin (FLOMAX) 0.4 MG CAPS capsule Take 0.4 mg by mouth daily. 05/21/22   [provider]    Family History    Family History  Problem Relation Age of Onset   Hypertension Mother    Heart disease Mother    He indicated that his mother is deceased. He indicated that his father is deceased. He indicated that his maternal grandmother is deceased. He indicated that his maternal grandfather is deceased. He indicated that his paternal grandmother is deceased. He indicated that his paternal grandfather is deceased.  Social History    Social History   Socioeconomic History   Marital status: Married    Spouse name: Jose Beck   Number of children: 0   Years of education: College   Highest education level: Not on file  Occupational History   Occupation:  Retired   Tobacco Use   Smoking status: Never   Smokeless tobacco: Never  Vaping Use   Vaping Use: Never used  Substance and Sexual Activity   Alcohol use: Yes    Alcohol/week: 1.0 standard drink of alcohol    Types: 1 Shots of liquor per week    Comment: cocktails every day   Drug use: No   Sexual activity: Not on file  Other Topics Concern   Not on file  Social History Narrative   Drinks coffee daily    Social Determinants of Health   Financial Resource Strain: Not on file  Food Insecurity: No Food Insecurity (07/12/2022)   Hunger Vital Sign    Worried About Running Out of Food in the Last Year: Never true    Ran Out of Food in the Last Year: Never true  Transportation Needs: No Transportation Needs (07/12/2022)   PRAPARE - Administrator, Civil Service (Medical): No    Lack of Transportation (Non-Medical): No  Physical Activity: Not on file  Stress: Not on file  Social Connections: Not on file  Intimate Partner Violence: Not At Risk (07/12/2022)   Humiliation, Afraid, Rape, and Kick questionnaire    Fear of Current or Ex-Partner: No    Emotionally Abused: No    Physically Abused: No    Sexually Abused: No     Review of Systems    General:  No chills, fever, night sweats or weight changes.  Cardiovascular:  No chest pain, dyspnea on exertion, edema, orthopnea, palpitations, paroxysmal nocturnal dyspnea. Dermatological: No rash, lesions/masses Respiratory: No cough, dyspnea Urologic: No hematuria, dysuria Abdominal:   No nausea, vomiting, diarrhea, bright red blood per rectum, melena, or hematemesis Neurologic:  No visual changes, wkns, changes in mental status. All other systems reviewed and are otherwise negative except as noted above.  Physical Exam    VS:  BP 112/60   Pulse 85   Ht 5\' 8"  (1.727 m)   Wt 190 lb 8 oz (86.4 kg)   SpO2 96%   BMI 28.97 kg/m  , BMI Body mass index is 28.97 kg/m. GEN: Well nourished, well developed, in no acute  distress. HEENT: normal. Neck: Supple, no JVD, carotid bruits, or masses. Cardiac: RRR, no murmurs, rubs, or gallops. No clubbing, cyanosis, edema.  Radials/DP/PT 2+ and equal bilaterally.  Respiratory:  Respirations regular and unlabored,  clear to auscultation bilaterally. GI: Soft, nontender, nondistended, BS + x 4. MS: no deformity or atrophy. Skin: warm and dry, no rash. Neuro:  Strength and sensation are intact. Psych: Normal affect.  Accessory Clinical Findings    Recent Labs: 07/12/2022: B Natriuretic Peptide 607.7; TSH 0.673 07/13/2022: ALT 28 07/15/2022: BUN 30; Creatinine, Ser 1.40; Hemoglobin 9.1; Magnesium 2.2; Platelets 112; Potassium 3.9; Sodium 136   Recent Lipid Panel    Component Value Date/Time   CHOL 127 04/28/2022 0431   TRIG 59 04/28/2022 0431   HDL 55 04/28/2022 0431   CHOLHDL 2.3 04/28/2022 0431   VLDL 12 04/28/2022 0431   LDLCALC 60 04/28/2022 0431         ECG personally reviewed by me today-none today.  Cardiac catheterization 11/16/2015  Mid RCA lesion, 100% stenosed. Ost 1st Diag to 1st Diag lesion, 70% stenosed. Mid LAD lesion, 100% stenosed. Dist RCA lesion, 100% stenosed. Dist Cx lesion, 60% stenosed.   Total occlusion of the proximal RCA. RCA is heavily calcified. RCA fills by collaterals from the circumflex coronary of the left system. The right coronary is large in distribution. Total occlusion of the proximal to mid LAD after the origin of the first of perforator and first diagonal. Left to left collateral supply the LAD and a second diagonal. The first diagonal contains eccentric 50-70% narrowing. Widely patent circumflex including a small ramus intermedius/first obtuse marginal, and a large branching second obtuse marginal. The distal circumflex beyond the second marginal is small and contains 50-70% narrowing. The circumflex is the source of collaterals to the distal right coronary. Mildly depressed LV function with estimated EF in the 35 to  40% range. LVEDP is mildly elevated. The inferior wall is hypokinetic.     RECOMMENDATIONS:   Images were reviewed with Dr. Swaziland. The plan at this time is to continue medical therapy unless symptoms progress. Aggressive risk factor modification. Heart failure therapy as indicated.  Diagnostic Dominance: Right  Intervention   Echocardiogram 07/12/2022  IMPRESSIONS     1. Left ventricular ejection fraction, by estimation, is 30 to 35%. The  left ventricle has moderately decreased function. The left ventricle  demonstrates regional wall motion abnormalities (see scoring  diagram/findings for description). There is mild  left ventricular hypertrophy. Left ventricular diastolic parameters are  consistent with Grade II diastolic dysfunction (pseudonormalization).  Elevated left atrial pressure.   2. Right ventricular systolic function is normal. The right ventricular  size is mildly enlarged. There is normal pulmonary artery systolic  pressure. The estimated right ventricular systolic pressure is 32.6 mmHg.   3. Left atrial size was mildly dilated.   4. The mitral valve is normal in structure. Mild mitral valve  regurgitation.   5. The inferior vena cava is normal in size with greater than 50%  respiratory variability, suggesting right atrial pressure of 3 mmHg.   6. The aortic valve is calcified. Aortic valve regurgitation is not  visualized. Severe aortic valve stenosis. Moderate AS by gradients (MG 25  mmHg, Vmax 3.2 m/s), severe by AVA (0.9cm^2) and DI (0.25). Low SV index  (26 cc/m^2), suspect low flow low  gradient severe AS   FINDINGS   Left Ventricle: Left ventricular ejection fraction, by estimation, is 30  to 35%. The left ventricle has moderately decreased function. The left  ventricle demonstrates regional wall motion abnormalities. Definity  contrast agent was given IV to delineate  the left ventricular endocardial borders. The left ventricular internal  cavity  size was normal  in size. There is mild left ventricular  hypertrophy. Left ventricular diastolic parameters are consistent with  Grade II diastolic dysfunction  (pseudonormalization). Elevated left atrial pressure.     LV Wall Scoring:  The mid and distal anterior septum, mid and distal inferior wall, mid  inferoseptal segment, apical anterior segment, and apex are akinetic. The  anterior wall, entire lateral wall, basal anteroseptal segment, basal  inferior segment, and basal inferoseptal segment are normal.   Right Ventricle: The right ventricular size is mildly enlarged. No  increase in right ventricular wall thickness. Right ventricular systolic  function is normal. There is normal pulmonary artery systolic pressure.  The tricuspid regurgitant velocity is 2.72   m/s, and with an assumed right atrial pressure of 3 mmHg, the estimated  right ventricular systolic pressure is 32.6 mmHg.   Left Atrium: Left atrial size was mildly dilated.   Right Atrium: Right atrial size was normal in size.   Pericardium: There is no evidence of pericardial effusion.   Mitral Valve: The mitral valve is normal in structure. Mild mitral valve  regurgitation.   Tricuspid Valve: The tricuspid valve is normal in structure. Tricuspid  valve regurgitation is trivial.   Aortic Valve: The aortic valve is calcified. Aortic valve regurgitation is  not visualized. Severe aortic stenosis is present. Aortic valve mean  gradient measures 25.0 mmHg. Aortic valve peak gradient measures 42.0  mmHg. Aortic valve area, by VTI measures   0.87 cm.   Pulmonic Valve: The pulmonic valve was not well visualized. Pulmonic valve  regurgitation is trivial.   Aorta: The aortic root and ascending aorta are structurally normal, with  no evidence of dilitation.   Venous: The inferior vena cava is normal in size with greater than 50%  respiratory variability, suggesting right atrial pressure of 3 mmHg.   IAS/Shunts: The  interatrial septum was not well visualized.    Assessment & Plan   1.  Ischemic cardiomyopathy-ambulating with walker.  Echocardiogram showed reduced EF and moderate-severe AS.  He is not a TAVR candidate.  Limited in GDMT due to blood pressure. Not a candidate for SGLT2 inhibitor d/t UTI.  Continue furosemide, metoprolol Continue losartan 12.5 mg daily Order BMP today and in 2 weeks Plan for repeat echocardiogram 7/24   Elevated troponin, CAD-denies chest pain.  Underwent cardiac catheterization 1992 and 2017 echocardiogram showed reduced LV function and G2 DD.  Details above.  Elevated troponins felt to be related to demand ischemia in the setting of urosepsis.  Has returned to all of his normal daily activities and denies chest pain. Continue atorvastatin, Toprol, nitroglycerin prn  Heart healthy low-sodium diet-salty 6 reviewed Maintain physical activity  Paroxysmal atrial fibrillation-heart rate today 85 bpm.  Denies bleeding issues.  Continues to deny episodes of irregular or accelerated heart rate. Continue metoprolol, apixaban Avoid triggers caffeine, chocolate, EtOH, dehydration etc.   Essential hypertension-BP today 112/60. Has not yet taken b/p medication Continue metoprolol Maintain blood pressure log  AAA-denies recent episodes of back chest and abdominal discomfort.  Underwent endovascular repair 7/19.  Recent CT showed stable repair. Follows with Dr. Myra Gianotti  History of CVA-ambulating with walker today.  Strength upper and lower extremities equal bilaterally. Follows with PCP  Type 2 diabetes-glucose 105 on 07/15/2022. Continue carb modified diet Follows with PCP  Disposition: Follow-up with Dr. Swaziland or me in 3 months.   Jose Beck. Jose Plessinger NP-C     09/05/2022, 1:49 PM [facility] [address] Office [phone]  Fax [phone]    I spent 14 minutes examining this patient, reviewing medications, and using patient  centered shared decision making involving her cardiac care.  Prior to her visit I spent greater than 20 minutes reviewing her past medical history,  medications, and prior cardiac tests.

## 2022-09-05 ENCOUNTER — Ambulatory Visit: Payer: Medicare Other | Attending: General Practice | Admitting: General Practice

## 2022-09-05 ENCOUNTER — Encounter: Payer: Self-pay | Admitting: General Practice

## 2022-09-05 VITALS — BP 112/60 | HR 85 | Ht 68.0 in | Wt 190.5 lb

## 2022-09-05 DIAGNOSIS — I2510 Atherosclerotic heart disease of native coronary artery without angina pectoris: Secondary | ICD-10-CM | POA: Diagnosis not present

## 2022-09-05 DIAGNOSIS — Z8673 Personal history of transient ischemic attack (TIA), and cerebral infarction without residual deficits: Secondary | ICD-10-CM

## 2022-09-05 DIAGNOSIS — R7989 Other specified abnormal findings of blood chemistry: Secondary | ICD-10-CM

## 2022-09-05 DIAGNOSIS — I480 Paroxysmal atrial fibrillation: Secondary | ICD-10-CM | POA: Diagnosis not present

## 2022-09-05 DIAGNOSIS — I255 Ischemic cardiomyopathy: Secondary | ICD-10-CM | POA: Diagnosis not present

## 2022-09-05 DIAGNOSIS — I10 Essential (primary) hypertension: Secondary | ICD-10-CM

## 2022-09-05 DIAGNOSIS — I7133 Infrarenal abdominal aortic aneurysm, ruptured: Secondary | ICD-10-CM

## 2022-09-05 NOTE — Patient Instructions (Signed)
Medication Instructions:  The current medical regimen is effective;  continue present plan and medications as directed. Please refer to the Current Medication list given to you today.  *If you need a refill on your cardiac medications before your next appointment, please call your pharmacy*  Lab Work: NONE If you have labs (blood work) drawn today and your tests are completely normal, you will receive your results only by: MyChart Message (if you have MyChart) OR A paper copy in the mail If you have any lab test that is abnormal or we need to change your treatment, we will call you to review the results.  Testing/Procedures: Echocardiogram - Your physician has requested that you have an echocardiogram. Echocardiography is a painless test that uses sound waves to create images of your heart. It provides your doctor with information about the size and shape of your heart and how well your heart's chambers and valves are working. This procedure takes approximately one hour. There are no restrictions for this procedure.    PLEASE READ AND FOLLOW ATTACHED  SALTY 6   Follow-Up: At [facility], you and your health needs are our priority.  As part of our continuing mission to provide you with exceptional heart care, we have created designated Provider Care Teams.  These Care Teams include your primary Cardiologist (physician) and Advanced Practice Providers (APPs -  Physician Assistants and Nurse Practitioners) who all work together to provide you with the care you need, when you need it.  Your next appointment:   AFTER ECHO   Provider:   Peter Swaziland, MD     Other Instructions TAKE AND LOG YOUR WEIGHT DAILY

## 2022-09-23 NOTE — Therapy (Signed)
  OUTPATIENT PHYSICAL THERAPY NEURO TREATMENT    Patient Name: Jose Beck MRN: [id number] DOB:[birth year], 77 y.o., male Today's Date: 09/23/2022   PCP: Geoffry Paradise, MD REFERRING PROVIDER: Geoffry Paradise, MD   SUBJECTIVE:  Patient reports that he is back- nothing is new. Reports that he did hurt his back which started out of nowhere. Pain occurs in the R LB. R shoulder also still hurts but this is not new. 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 Reports that pt has not been willing to do his HEP in the last week whereas he was previously very diligent with this in the past.     Pt accompanied by: significant other "[name]"  PERTINENT HISTORY: past medical history of stroke, AAA, type 2 diabetes, hypertension, CAD who presents to the emergency department with fall.  PAIN:  Are you having pain? Yes: NPRS scale: 9/10 Pain location: R LB Pain description: pt unable to verbalize Aggravating factors: pt unable to verbalize Relieving factors: standing up  OBJECTIVE:    TODAY'S TREATMENT: 09/24/22 Activity Comments  palpation Pt very TTP over upper lumbar spinous process and less so in R QL  Pt was not willing to continue with remainder of session, requested to leave      PATIENT EDUCATION: Education details: d/t pt's hx of compression fx and spinal metastasis, advised wife to call patient's spinal specialist on what meds may be appropriate to control pain and need to get clearance or new referral for LBP Person educated: Patient and Spouse Education method: Explanation Education comprehension: verbalized understanding   ASSESSMENT:  CLINICAL IMPRESSION: Patient arrived to session with wife, returning after 30 day hold with report of R sided LBP which started insidiously. 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 Patient reports 9/10 pain and repost that  he should not have come in to appointment today- wife was unaware of severity of patient's pain. Upon gentle palpation, patient very TTP over superior lumbar spinous process and R QL. Offered moist heat, however patient refused and requested to leave. Advised wife to reach out to patient's spinal MD for direction on safe pain meds to take and for clearance/referral for PT for LBP. Wife reported understanding. Patient left without being seen.  Anette Guarneri, PT, DPT 09/24/22 3:13 PM  [facility] [address] [place], [address] [place], [place] [address] Phone # [phone] Fax # [phone]

## 2022-09-24 ENCOUNTER — Ambulatory Visit: Payer: Medicare Other | Admitting: Physical Therapy

## 2022-09-24 ENCOUNTER — Encounter: Payer: Self-pay | Admitting: Physical Therapy

## 2022-09-24 ENCOUNTER — Other Ambulatory Visit: Payer: Self-pay

## 2022-09-24 ENCOUNTER — Ambulatory Visit: Payer: Medicare Other | Attending: Internal Medicine

## 2022-09-24 DIAGNOSIS — M6281 Muscle weakness (generalized): Secondary | ICD-10-CM | POA: Diagnosis not present

## 2022-09-24 DIAGNOSIS — R262 Difficulty in walking, not elsewhere classified: Secondary | ICD-10-CM | POA: Insufficient documentation

## 2022-09-24 DIAGNOSIS — R41841 Cognitive communication deficit: Secondary | ICD-10-CM | POA: Diagnosis present

## 2022-09-24 DIAGNOSIS — R471 Dysarthria and anarthria: Secondary | ICD-10-CM | POA: Diagnosis present

## 2022-09-24 DIAGNOSIS — R2689 Other abnormalities of gait and mobility: Secondary | ICD-10-CM | POA: Insufficient documentation

## 2022-09-24 DIAGNOSIS — R4701 Aphasia: Secondary | ICD-10-CM | POA: Diagnosis present

## 2022-09-24 DIAGNOSIS — R2681 Unsteadiness on feet: Secondary | ICD-10-CM | POA: Diagnosis not present

## 2022-09-24 NOTE — Therapy (Addendum)
OUTPATIENT SPEECH LANGUAGE PATHOLOGY EVALUATION   Patient Name: Jose Beck MRN: [id number] DOB:December 02, 1945, 77 y.o., male Today's Date: 09/24/2022  PCP: Michail Jewels, MD REFERRING PROVIDER: Geoffry Paradise, MD  END OF SESSION:  End of Session - 09/24/22 1736     Visit Number 1    Number of Visits 17    Date for SLP Re-Evaluation 11/28/22    SLP Start Time 1403    SLP Stop Time  1445    SLP Time Calculation (min) 42 min    Activity Tolerance Other (comment)   pt somewhat limited by flat affect/lethargy            Past Medical History:  Diagnosis Date   Abdominal aortic aneurysm (AAA) (HCC)    Aortic stenosis    a. 11/2015 Echo: EF 55-60%, Gr1 DD, mild AS.   Chronic combined systolic and diastolic CHF (congestive heart failure) (HCC)    a. 07/2015: TEE showing EF of 40% b. 11/2015: Echo w/ EF 55-60%, Grade 1 DD, mild AS.   Coronary artery disease    a. patient reports 100% stenosis of LAD and RCA by cath in 1999. b. 11/2015 Cath: LM nl, LAD 100 - fills by L->L collats from D1, LCX 60d, OM1 lalrge/nl, OM2 small/nl, RCA 14m, L->R collats.EF 35-45% (55-60% by echo).   Diabetes mellitus without complication (HCC)    Type II   GERD (gastroesophageal reflux disease)    Hyperlipemia    Hypertension    Hypertensive heart disease    Stroke (HCC)    a. 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   Thrombocytopenia (HCC) 11/2015   Past Surgical History:  Procedure Laterality Date   ABDOMINAL AORTIC ENDOVASCULAR STENT GRAFT N/A 11/25/2017   Procedure: ABDOMINAL AORTIC ENDOVASCULAR STENT GRAFT;  Surgeon: Nada Libman, MD;  Location: [facility] OR;  Service: Vascular;  Laterality: N/A;   CARDIAC CATHETERIZATION     CARDIAC CATHETERIZATION N/A 11/16/2015   Procedure: Left Heart Cath and Coronary Angiography;  Surgeon: Lyn Records, MD;  Location: [facility] INVASIVE CV LAB;  Service: Cardiovascular;  Laterality: N/A;   MOLE REMOVAL     TONSILLECTOMY     Patient Active Problem List    Diagnosis Date Noted   Acute urinary retention 07/15/2022   Aortic valve stenosis, nonrheumatic 07/13/2022   NSVT (nonsustained ventricular tachycardia) (HCC) 07/13/2022   Sepsis secondary to UTI (HCC) 07/12/2022   Acute systolic heart failure (HCC) 04/27/2022   Paroxysmal atrial fibrillation (HCC) 04/26/2022   Demand myocardial infarction (HCC) 04/26/2022   Demand ischemia 04/26/2022   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   SAH (subarachnoid hemorrhage) (HCC) 12/19/2021   Cerebrovascular accident (HCC) 01/04/2018   Compression fracture of thoracic vertebra (HCC) 01/04/2018   AAA (abdominal aortic aneurysm) (HCC) 11/25/2017   History of stroke 01/15/2017   Hyperlipidemia LDL goal <70 04/18/2016   Hypertension    Hypertensive heart disease    Chest pain 11/16/2015   Type 2 diabetes mellitus with circulatory disorder, with long-term current use of insulin (HCC) 11/16/2015   Essential hypertension 11/16/2015   Thrombocytopenia (HCC) 11/16/2015   Leukopenia 11/16/2015   Hyponatremia 11/16/2015   Chronic combined systolic and diastolic CHF (congestive heart failure) (HCC) 11/16/2015   CAD in native artery    Brainstem infarct, acute (HCC) 09/17/2015   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) 09/17/2015   Obesity 09/17/2015   Ataxia, 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 Pt with lingering  cognitive communication deficits (memory, anomia), and dysarthria at that time.  Pt accompanied by: significant other wife Darel Hong  PERTINENT HISTORY: severe CAD s/p known occlusion of LAD and RCA, chronic combined systolic and diastolic CHF, hypertension, hyperlipidemia, type 2 diabetes, GERD,  AAA s/p repair,  CVA 2017, [facility]ib on [facility], metastatic prostate CA.   PAIN:  Are you having pain? Yes NPRS scale: 9/10 Pain location: R LB Pain description: pt unable to verbalize Aggravating factors: pt unable to verbalize Relieving factors: standing up  FALLS: Has patient fallen in last 6 months?  See PT evaluation for details  LIVING ENVIRONMENT: Lives with: lives with their spouse Lives in: House/apartment  PLOF:  Level of assistance: Needed assistance with ADLs, Needed assistance with IADLS Employment: Retired  PATIENT GOALS: Communicate more effectively with wife at home  OBJECTIVE:   DIAGNOSTIC FINDINGS: CT HEAD FINDINGS January 2024 IMPRESSION: 1. No acute intracranial abnormalities. 2. No fracture or traumatic malalignment in the cervical spine. 3. Sclerosis in the left frontal bone appears larger compared to December 19, 2021. This finding is not specific. However, an enlarging sclerotic focus in the left frontal bone of a 77 year old man raises the possibility of a sclerotic metastasis. Does the patient have history of cancer? A bone scan as an outpatient may help with further evaluation.  SLE 12/20/21 Assessment / Plan / Recommendation Clinical Impression   Pt with hx of dysarthria and cognitive communication deficits, presents with no changes from baseline per wife/pt report. Wife reports that pt received OP SLP services in 2017 post CVA and that he still does daily exercises/activities to improve his speech and memory. Pt's speech at the simple conversation level is 50% intelligible and marked by reduced articulatory precision and low vocal intensity. He was noted to  continue speaking on residual air, further reducing intelligiblity. With cues for slow rate, over articulation and pausing, speech intelligibility improved to 60-70% at the sentence level. He is oriented x4, but presents with poor delayed recall of listed items (2/5) and executive functions. Receptive and expressive language appeared [facility] for tasks assessed. Given reports of baseline function, no further SLP services warranted acutely. He would beneift from f/u services at next level of care if desired. Will s/o.     COGNITION: Overall cognitive status: History of cognitive impairments - at baseline Areas of impairment: Memory, Safety/judgement, and Awareness  MOTOR SPEECH: Overall motor speech: impaired Level of impairment: Word level -> conversation Respiration: clavicular breathing Phonation: low vocal intensity Resonance: hypernasality- intermittent with rapid articulatory patterns (e.g., p^p^p^p^p^) Articulation: Impaired: word level -> conversation Intelligibility: Intelligibility reduced today 92%. Wife states pt's intelligibility at home is approx 50% Motor planning: Appears intact Motor speech errors: aware and inconsistent Interfering components: premorbid status Effective technique: increased vocal intensity and over articulate  ORAL MOTOR EXAMINATION: Overall status: Impaired: Labial: Right (Coordination) Lingual: Right (Strength and Coordination)  Pt does not report difficulty with swallowing which does not warrant further evaluation.  PATIENT REPORTED OUTCOME MEASURES (PROM): Communication Effectiveness Survey: 28/32 Brett Canales), 25/32 Darel Hong), with lower  scores indicating more effective communication.  TODAY'S TREATMENT:                                                                                                                                         DATE:   09/24/22: (eval) N/A  PATIENT EDUCATION: Education details: eval results, plan of care, do 4 weeks of ST and then  reassess if 4 more weeks possible in light of pt progress and participation Person educated: Patient and Spouse Education method: Explanation Education comprehension: verbalized understanding and needs further education  HOME EXERCISE PROGRAM: SLP told pt to cont to practice HEP from 2017.   GOALS: Goals reviewed with patient? No  SHORT TERM GOALS: Target date: 10/31/22  Brett Canales will complete HEP at least 5/7 days/week over a one week period Baseline: Goal status: INITIAL  2.   Pt will improve intelligibility to 95%+ in sentence responses, one repetition allowed, in two sessions Baseline:  Goal status: INITIAL  3.  Pt will improve intelligibility to 80% at home, two repetitions allowed, as reported by wife Baseline:  Goal status: INITIAL   LONG TERM GOALS: Target date: 12/03/22  Pt will improve his PROM score from initial administration during the last 1-2 ST sessions Baseline:  Goal status: INITIAL  2.  Pt will improve intelligibility to 90% at home, two repetitions allowed, as reported by wife Baseline:  Goal status: INITIAL  3.  Pt will demo intelligibility 100% to 10 everyday sentences in 4 sessions Baseline:  Goal status: INITIAL   ASSESSMENT:  CLINICAL IMPRESSION: Patient is a 77 y.o. M who was seen today for assessment of speech skills after a CVA in 2017 with recent deconditioning. Darel Hong stated pt's articulation/speech clarity decr'd late last year, but prior to that pt had practiced HEP from previous HEP Monday-Friday. Pt would like to communicate more effectively with wife at home and goals reflect that. Today, pt demonstrated decr'd interest in attending therapy sessions but did affirmatively indicate when asked in yes/no format if he would like to communicate more effectively with wife at home so he was encouraged to participate in ST to the fullest degree and this will give him best opportunity for improving communication.  OBJECTIVE IMPAIRMENTS: Objective  impairments include memory, aphasia, and dysarthria. These impairments are limiting patient from ADLs/IADLs and effectively communicating at home and in community.Factors affecting potential to achieve goals and functional outcome are ability to learn/carryover information, cooperation/participation level, previous level of function, and severity of impairments.. Patient will benefit from skilled SLP services to address above impairments and improve overall function.  REHAB POTENTIAL: Fair given above factors in italics  PLAN:  SLP FREQUENCY: 2x/week  SLP DURATION: 8 weeks with assessment of participation and progress after 4 weeks. If participation is less than necessary, or progress is minimal pt may be d/c'd with instructions about how to cont ST plan at home.  PLANNED INTERVENTIONS:  Environmental controls, Internal/external aids, Oral motor exercises, Functional tasks, Multimodal communication approach, SLP instruction and feedback, Compensatory strategies, and Patient/family education    [facility], CCC-SLP 09/24/2022, 6:24 PM

## 2022-09-30 ENCOUNTER — Ambulatory Visit: Payer: Medicare Other

## 2022-10-02 ENCOUNTER — Telehealth: Payer: Self-pay | Admitting: Hematology and Oncology

## 2022-10-02 NOTE — Telephone Encounter (Signed)
scheduled per referral , pt wife has been called and confirmed date and time. Pt is aware of location and to arrive early for check in

## 2022-10-03 ENCOUNTER — Ambulatory Visit (HOSPITAL_COMMUNITY): Payer: Medicare Other | Attending: Cardiology

## 2022-10-03 DIAGNOSIS — I255 Ischemic cardiomyopathy: Secondary | ICD-10-CM | POA: Insufficient documentation

## 2022-10-03 LAB — ECHOCARDIOGRAM COMPLETE
AR max vel: 0.86 cm2
AV Area VTI: 0.77 cm2
AV Area mean vel: 0.78 cm2
AV Mean grad: 24.3 mmHg
AV Peak grad: 37.2 mmHg
Ao pk vel: 3.05 m/s
Area-P 1/2: 4.15 cm2
Calc EF: 44.4 %
S' Lateral: 4.2 cm
Single Plane A2C EF: 49.3 %
Single Plane A4C EF: 39.3 %

## 2022-10-06 ENCOUNTER — Encounter: Payer: Self-pay | Admitting: Hematology and Oncology

## 2022-10-07 ENCOUNTER — Ambulatory Visit: Payer: Medicare Other

## 2022-10-07 ENCOUNTER — Other Ambulatory Visit: Payer: Self-pay

## 2022-10-07 DIAGNOSIS — R4701 Aphasia: Secondary | ICD-10-CM

## 2022-10-07 DIAGNOSIS — R471 Dysarthria and anarthria: Secondary | ICD-10-CM

## 2022-10-07 DIAGNOSIS — R41841 Cognitive communication deficit: Secondary | ICD-10-CM

## 2022-10-07 DIAGNOSIS — I713 Abdominal aortic aneurysm, ruptured, unspecified: Secondary | ICD-10-CM

## 2022-10-07 NOTE — Therapy (Signed)
OUTPATIENT SPEECH LANGUAGE PATHOLOGY TREATMENT   Patient Name: Jose Beck MRN: [id number] DOB:25-Aug-1945, 77 y.o., male Today's Date: 10/07/2022  PCP: Michail Jewels, MD REFERRING PROVIDER: Geoffry Paradise, MD  END OF SESSION:  End of Session - 10/07/22 1021     Visit Number 2    Number of Visits 17    Date for SLP Re-Evaluation 11/28/22    SLP Start Time 1020    SLP Stop Time  1100    SLP Time Calculation (min) 40 min    Activity Tolerance Other (comment)             Past Medical History:  Diagnosis Date   Abdominal aortic aneurysm (AAA) (HCC)    Aortic stenosis    a. 11/2015 Echo: EF 55-60%, Gr1 DD, mild AS.   Chronic combined systolic and diastolic CHF (congestive heart failure) (HCC)    a. 07/2015: TEE showing EF of 40% b. 11/2015: Echo w/ EF 55-60%, Grade 1 DD, mild AS.   Coronary artery disease    a. patient reports 100% stenosis of LAD and RCA by cath in 1999. b. 11/2015 Cath: LM nl, LAD 100 - fills by L->L collats from D1, LCX 60d, OM1 lalrge/nl, OM2 small/nl, RCA 123m, L->R collats.EF 35-45% (55-60% by echo).   Diabetes mellitus without complication (HCC)    Type II   GERD (gastroesophageal reflux disease)    Hyperlipemia    Hypertension    Hypertensive heart disease    Stroke (HCC)    a. 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   Thrombocytopenia (HCC) 11/2015   Past Surgical History:  Procedure Laterality Date   ABDOMINAL AORTIC ENDOVASCULAR STENT GRAFT N/A 11/25/2017   Procedure: ABDOMINAL AORTIC ENDOVASCULAR STENT GRAFT;  Surgeon: Nada Libman, MD;  Location: [facility] OR;  Service: Vascular;  Laterality: N/A;   CARDIAC CATHETERIZATION     CARDIAC CATHETERIZATION N/A 11/16/2015   Procedure: Left Heart Cath and Coronary Angiography;  Surgeon: Lyn Records, MD;  Location: [facility] INVASIVE CV LAB;  Service: Cardiovascular;  Laterality: N/A;   MOLE REMOVAL     TONSILLECTOMY     Patient Active Problem List   Diagnosis Date Noted   Acute urinary  retention 07/15/2022   Aortic valve stenosis, nonrheumatic 07/13/2022   NSVT (nonsustained ventricular tachycardia) (HCC) 07/13/2022   Sepsis secondary to UTI (HCC) 07/12/2022   Acute systolic heart failure (HCC) 04/27/2022   Paroxysmal atrial fibrillation (HCC) 04/26/2022   Demand myocardial infarction (HCC) 04/26/2022   Demand ischemia 04/26/2022   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   SAH (subarachnoid hemorrhage) (HCC) 12/19/2021   Cerebrovascular accident (HCC) 01/04/2018   Compression fracture of thoracic vertebra (HCC) 01/04/2018   AAA (abdominal aortic aneurysm) (HCC) 11/25/2017   History of stroke 01/15/2017   Hyperlipidemia LDL goal <70 04/18/2016   Hypertension    Hypertensive heart disease    Chest pain 11/16/2015   Type 2 diabetes mellitus with circulatory disorder, with long-term current use of insulin (HCC) 11/16/2015   Essential hypertension 11/16/2015   Thrombocytopenia (HCC) 11/16/2015   Leukopenia 11/16/2015   Hyponatremia 11/16/2015   Chronic combined systolic and diastolic CHF (congestive heart failure) (HCC) 11/16/2015   CAD in native artery    Brainstem infarct, acute (HCC) 09/17/2015   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) 09/17/2015   Obesity 09/17/2015   Ataxia, 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 Pt accompanied by: self   PERTINENT HISTORY: severe CAD s/p known occlusion of LAD and RCA, chronic  combined systolic and diastolic CHF, hypertension, hyperlipidemia, type 2 diabetes, GERD,  AAA s/p repair,  CVA 2017, Afib on [facility] hospital, metastatic prostate CA.   PAIN:  Are you having pain? Yes NPRS scale: 10/10 Pain location: low back Pain description: constant Aggravating factors: nothing Relieving factors: nothing  FALLS: Has patient fallen in last 6 months?  See PT evaluation for details   PATIENT GOALS: Communicate more effectively with wife at home  OBJECTIVE:   DIAGNOSTIC FINDINGS: CT HEAD FINDINGS January 2024 IMPRESSION: 1. No acute intracranial abnormalities. 2. No fracture or traumatic malalignment in the cervical spine. 3. Sclerosis in the left frontal bone appears larger compared to December 19, 2021. This finding is not specific. However, an enlarging sclerotic focus in the left frontal bone of a 77 year old man raises the possibility of a sclerotic metastasis. Does the patient have history of cancer? A bone scan as an outpatient may help with further evaluation.  SLE 12/20/21 Assessment / Plan / Recommendation Clinical Impression   Pt with hx of dysarthria and cognitive communication deficits, presents with no changes from baseline per wife/pt report. Wife reports that pt received OP SLP services in 2017 post CVA and that he still does daily exercises/activities to improve his speech and memory. Pt's speech at the simple conversation level is 50% intelligible and marked by reduced articulatory precision and low vocal intensity. He was noted to continue speaking on residual air, further reducing intelligiblity. With cues for slow rate, over articulation and pausing, speech intelligibility improved to 60-70% at the sentence level. He is oriented x4, but presents with poor delayed recall of listed items (2/5) and executive functions. Receptive and expressive language appeared [facility] for tasks assessed. Given reports of baseline function, no further SLP services warranted  acutely. He would beneift from f/u services at next level of care if desired. Will s/o.     Marland Kitchen  PATIENT REPORTED OUTCOME MEASURES (PROM): Communication Effectiveness Survey: 28/32 Brett Canales), 25/32 Darel Hong), with lower scores indicating more effective communication.  TODAY'S TREATMENT:                                                                                                                                         DATE:   10/07/22: Today SLP pared down pt's dysarthria HEP to "A" or "B". Brett Canales req'd usual mod-max A for overarticulation with these phrases, and max A for loud ("shout"). Wife agreed pt was  easier to understand when he read phrases with louder vocal intensity. Oral motor HEP were introduced today with pt. Pt req'd max A/simultaneous production with this HEP, possibly due to s/e of pain meds (?). If this behavior was not s/e of pain meds, then pt continues to appear unmotivated for positive change for speech intelligibility, and/or flat affect. SLP able to progress through first 3 of the 6 HEP exercises; SLP to work with pt with exercises 4-6 next session and Brett Canales will complete exercises 1-3 until next session, along with his pared-down dysarthria HEP.  09/24/22: (eval) N/A  PATIENT EDUCATION: Education details: eval results, plan of care, do 4 weeks of ST and then reassess if 4 more weeks possible in light of pt progress and participation Person educated: Patient and Spouse Education method: Explanation Education comprehension: verbalized understanding and needs further education  HOME EXERCISE PROGRAM: SLP told pt to cont to practice HEP from 2017.   GOALS: Goals reviewed with patient? No  SHORT TERM GOALS: Target date: 10/31/22  Brett Canales will complete HEP at least 5/7 days/week over a one week period Baseline: Goal status: IN PROGRESS  2.   Pt will improve intelligibility to 95%+ in sentence responses, one repetition allowed, in two sessions Baseline:  Goal status: IN  PROGRESS  3.  Pt will improve intelligibility to 80% at home, two repetitions allowed, as reported by wife Baseline:  Goal status: IN PROGRESS   LONG TERM GOALS: Target date: 12/03/22  Pt will improve his PROM score from initial administration during the last 1-2 ST sessions Baseline:  Goal status: IN PROGRESS  2.  Pt will improve intelligibility to 90% at home, two repetitions allowed, as reported by wife Baseline:  Goal status: IN PROGRESS  3.  Pt will demo intelligibility 100% to 10 everyday sentences in 4 sessions Baseline:  Goal status: IN PROGRESS   ASSESSMENT:  CLINICAL IMPRESSION: Patient "Brett Canales" is a 77 y.o. M who was seen today for treatment of speech skills after a CVA in 2017 with recent deconditioning. See today's treatment note for more details. Today, Brett Canales demonstrated decr'd interest in ST tasks, possibly due to taking pain meds prior to session. During eval, Darel Hong stated pt's articulation/speech clarity decr'd late last year, but prior to that pt had practiced HEP from previous HEP Monday-Friday. Pt would like to communicate more effectively with wife at home and his therapy goals reflect this desire.   OBJECTIVE IMPAIRMENTS: Objective impairments include memory, aphasia, and dysarthria. These impairments are limiting patient from ADLs/IADLs and effectively communicating at home and in community.Factors affecting potential to achieve goals and functional outcome are ability to learn/carryover information, cooperation/participation level, previous level of function, and severity of impairments.. Patient will benefit from skilled SLP services to address above impairments and improve overall function.  REHAB POTENTIAL: Fair given above factors in italics  PLAN:  SLP FREQUENCY: 2x/week  SLP DURATION: 8 weeks with assessment of participation and progress after 4 weeks. If participation is less than necessary, or progress is minimal pt may be d/c'd with instructions  about how to cont ST plan at home.  PLANNED INTERVENTIONS: Environmental controls, Internal/external aids, Oral motor exercises, Functional tasks, Multimodal communication approach, SLP instruction and feedback, Compensatory strategies, and Patient/family education    [facility], CCC-SLP 10/07/2022, 10:22 AM

## 2022-10-07 NOTE — Patient Instructions (Signed)
LIP and TONGUE exercises  --- DO ALL EXERCISES TWICE EACH DAY  1. Lip press - Press your lips together firmly (like making a /m/ sound) and hold 5 seconds -repeat 10 times  2. LOUD and LONG P[place]or Montes - make a kissing sound as loud as you can, as long as you can - repeat 10 times  3. Tongue sweep - Sweep your tongue in the pockets of your mouth - touch every tooth  - five revolutions each way  4. MAKE THE INITIAL SOUNDS STRONG PIE!    PEA!   POOH! - 10 times         TIE!   TEA!    TWO!  - 10 times  KITE!   KEY!   COO! - 10 times  5. Move a straw from side to side in your mouth - 10 back and forth movements  6. Say "OOOOO", and "EEEEE" 10 times (Kiss and smile)

## 2022-10-12 ENCOUNTER — Encounter: Payer: Self-pay | Admitting: Hematology and Oncology

## 2022-10-13 ENCOUNTER — Ambulatory Visit: Payer: Medicare Other | Attending: Internal Medicine

## 2022-10-13 DIAGNOSIS — R41841 Cognitive communication deficit: Secondary | ICD-10-CM | POA: Insufficient documentation

## 2022-10-13 DIAGNOSIS — R471 Dysarthria and anarthria: Secondary | ICD-10-CM | POA: Insufficient documentation

## 2022-10-13 DIAGNOSIS — R4701 Aphasia: Secondary | ICD-10-CM | POA: Diagnosis present

## 2022-10-13 NOTE — Therapy (Signed)
OUTPATIENT SPEECH LANGUAGE PATHOLOGY TREATMENT   Patient Name: Jose Beck MRN: [id number] DOB:Dec 11, 1945, 77 y.o., male Today's Date: 10/13/2022  PCP: Michail Jewels, MD REFERRING PROVIDER: Geoffry Paradise, MD  END OF SESSION:  End of Session - 10/13/22 2039     Visit Number 3    Number of Visits 17    Date for SLP Re-Evaluation 11/28/22    SLP Start Time 1451    SLP Stop Time  1530    SLP Time Calculation (min) 39 min    Activity Tolerance Patient limited by pain              Past Medical History:  Diagnosis Date   Abdominal aortic aneurysm (AAA) (HCC)    Aortic stenosis    a. 11/2015 Echo: EF 55-60%, Gr1 DD, mild AS.   Chronic combined systolic and diastolic CHF (congestive heart failure) (HCC)    a. 07/2015: TEE showing EF of 40% b. 11/2015: Echo w/ EF 55-60%, Grade 1 DD, mild AS.   Coronary artery disease    a. patient reports 100% stenosis of LAD and RCA by cath in 1999. b. 11/2015 Cath: LM nl, LAD 100 - fills by L->L collats from D1, LCX 60d, OM1 lalrge/nl, OM2 small/nl, RCA 143m, L->R collats.EF 35-45% (55-60% by echo).   Diabetes mellitus without complication (HCC)    Type II   GERD (gastroesophageal reflux disease)    Hyperlipemia    Hypertension    Hypertensive heart disease    Stroke (HCC)    a. 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   Thrombocytopenia (HCC) 11/2015   Past Surgical History:  Procedure Laterality Date   ABDOMINAL AORTIC ENDOVASCULAR STENT GRAFT N/A 11/25/2017   Procedure: ABDOMINAL AORTIC ENDOVASCULAR STENT GRAFT;  Surgeon: Nada Libman, MD;  Location: [facility] OR;  Service: Vascular;  Laterality: N/A;   CARDIAC CATHETERIZATION     CARDIAC CATHETERIZATION N/A 11/16/2015   Procedure: Left Heart Cath and Coronary Angiography;  Surgeon: Lyn Records, MD;  Location: [facility] INVASIVE CV LAB;  Service: Cardiovascular;  Laterality: N/A;   MOLE REMOVAL     TONSILLECTOMY     Patient Active Problem List   Diagnosis Date Noted   Acute  urinary retention 07/15/2022   Aortic valve stenosis, nonrheumatic 07/13/2022   NSVT (nonsustained ventricular tachycardia) (HCC) 07/13/2022   Sepsis secondary to UTI (HCC) 07/12/2022   Acute systolic heart failure (HCC) 04/27/2022   Paroxysmal atrial fibrillation (HCC) 04/26/2022   Demand myocardial infarction (HCC) 04/26/2022   Demand ischemia 04/26/2022   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   SAH (subarachnoid hemorrhage) (HCC) 12/19/2021   Cerebrovascular accident (HCC) 01/04/2018   Compression fracture of thoracic vertebra (HCC) 01/04/2018   AAA (abdominal aortic aneurysm) (HCC) 11/25/2017   History of stroke 01/15/2017   Hyperlipidemia LDL goal <70 04/18/2016   Hypertension    Hypertensive heart disease    Chest pain 11/16/2015   Type 2 diabetes mellitus with circulatory disorder, with long-term current use of insulin (HCC) 11/16/2015   Essential hypertension 11/16/2015   Thrombocytopenia (HCC) 11/16/2015   Leukopenia 11/16/2015   Hyponatremia 11/16/2015   Chronic combined systolic and diastolic CHF (congestive heart failure) (HCC) 11/16/2015   CAD in native artery    Brainstem infarct, acute (HCC) 09/17/2015   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) 09/17/2015   Obesity 09/17/2015   Ataxia, 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   Gait disturbance, post-stroke 08/11/2015   History of CVA (cerebrovascular accident) 08/09/2015    ONSET  DATE: 2017  REFERRING DIAG: I63.9 (ICD-10-CM) - Cerebral infarction, unspecified R47.9 (ICD-10-CM) - Unspecified speech disturbances  THERAPY DIAG:  Dysarthria and anarthria  Aphasia  Cognitive communication deficit  Rationale for Evaluation and Treatment: Rehabilitation  SUBJECTIVE:   SUBJECTIVE STATEMENT: "We will see how this goes, he's on pain medication."   Pt accompanied by: significant other   PERTINENT HISTORY: severe CAD s/p known occlusion of LAD and RCA, chronic combined systolic and diastolic CHF,  hypertension, hyperlipidemia, type 2 diabetes, GERD,  AAA s/p repair,  CVA 2017, Afib on Eli[facility] hospital, metastatic prostate CA.   PAIN:  Are you having pain? Yes NPRS scale: 9/10 Pain location: low back Pain description: constant Aggravating factors: nothing Relieving factors: nothing  FALLS: Has patient fallen in last 6 months?  See PT evaluation for details   PATIENT GOALS: Communicate more effectively with Jose Beck at home  OBJECTIVE:   DIAGNOSTIC FINDINGS: CT HEAD FINDINGS January 2024 IMPRESSION: 1. No acute intracranial abnormalities. 2. No fracture or traumatic malalignment in the cervical spine. 3. Sclerosis in the left frontal bone appears larger compared to December 19, 2021. This finding is not specific. However, an enlarging sclerotic focus in the left frontal bone of a 77 year old man raises the possibility of a sclerotic metastasis. Does the patient have history of cancer? A bone scan as an outpatient may help with further evaluation.  SLE 12/20/21 Assessment / Plan / Recommendation Clinical Impression   Pt with hx of dysarthria and cognitive communication deficits, presents with no changes from baseline per Jose Beck/pt report. Jose Beck reports that pt received OP SLP services in 2017 post CVA and that he still does daily exercises/activities to improve his speech and memory. Pt's speech at the simple conversation level is 50% intelligible and marked by reduced articulatory precision and low vocal intensity. He was noted to continue speaking on residual air, further reducing intelligiblity. With cues for slow rate, over articulation and pausing, speech intelligibility improved to 60-70% at the sentence level. He is oriented x4, but presents with poor delayed recall of listed items (2/5) and executive functions. Receptive and expressive language appeared [facility] for tasks assessed. Given reports of baseline function, no further SLP services warranted acutely. He would beneift from f/u  services at next level of care if desired. Will s/o.     Marland Kitchen  PATIENT REPORTED OUTCOME MEASURES (PROM): Communication Effectiveness Survey: 28/32 Jose Beck), 25/32 Jose Beck), with lower scores indicating more effective communication.  TODAY'S TREATMENT:                                                                                                                                         DATE:  10/13/22: SLP targeted pt's dysarthria strength HEP. Pt req'd mod A usually to perform articulation segment with strong initial sounds (bilabial, dental, and velar), and min-mod A occasionally with the other exercises. SLP then targeted pt's speech intelligibility; pt read  20 conversational sentences with 95% intelligibility. SLP then engaged pt in responsive naming task with 85% intelligibility. Jose Beck's Jose Beck told SLP after the session she wished pt "would talk like this at home."  10/07/22: Today SLP pared down pt's dysarthria HEP to "A" or "B". Jose Beck req'd usual mod-max A for overarticulation with these phrases, and max A for loud ("shout"). Jose Beck agreed pt was easier to understand when he read phrases with louder vocal intensity. Oral motor HEP were introduced today with pt. Pt req'd max A/simultaneous production with this HEP, possibly due to s/e of pain meds (?). If this behavior was not s/e of pain meds, then pt continues to appear unmotivated for positive change for speech intelligibility, and/or flat affect. SLP able to progress through first 3 of the 6 HEP exercises; SLP to work with pt with exercises 4-6 next session and Jose Beck will complete exercises 1-3 until next session, along with his pared-down dysarthria HEP.  09/24/22: (eval) N/A  PATIENT EDUCATION: Education details: eval results, plan of care, do 4 weeks of ST and then reassess if 4 more weeks possible in light of pt progress and participation Person educated: Patient and Spouse Education method: Explanation Education comprehension: verbalized  understanding and needs further education  HOME EXERCISE PROGRAM: SLP told pt to cont to practice HEP from 2017.   GOALS: Goals reviewed with patient? No  SHORT TERM GOALS: Target date: 10/31/22  Jose Beck will complete HEP at least 5/7 days/week over a one week period Baseline: Goal status: IN PROGRESS  2.   Pt will improve intelligibility to 95%+ in sentence responses, one repetition allowed, in two sessions Baseline:  Goal status: IN PROGRESS  3.  Pt will improve intelligibility to 80% at home, two repetitions allowed, as reported by Jose Beck Baseline:  Goal status: IN PROGRESS   LONG TERM GOALS: Target date: 12/03/22  Pt will improve his PROM score from initial administration during the last 1-2 ST sessions Baseline:  Goal status: IN PROGRESS  2.  Pt will improve intelligibility to 90% at home, two repetitions allowed, as reported by Jose Beck Baseline:  Goal status: IN PROGRESS  3.  Pt will demo intelligibility 100% to 10 everyday sentences in 4 sessions Baseline:  Goal status: IN PROGRESS   ASSESSMENT:  CLINICAL IMPRESSION: Patient "Jose Beck" is a 77 y.o. M who was seen today for treatment of speech skills after a CVA in 2017 with recent deconditioning. See today's treatment note for more details. Today, Jose Beck demonstrated decr'd interest in ST tasks, possibly due to taking pain meds prior to session. During eval, Jose Beck stated pt's articulation/speech clarity decr'd late last year, but prior to that pt had practiced HEP from previous HEP Monday-Friday. Pt would like to communicate more effectively with Jose Beck at home and his therapy goals reflect this desire.   OBJECTIVE IMPAIRMENTS: Objective impairments include memory, aphasia, and dysarthria. These impairments are limiting patient from ADLs/IADLs and effectively communicating at home and in community.Factors affecting potential to achieve goals and functional outcome are ability to learn/carryover information,  cooperation/participation level, previous level of function, and severity of impairments.. Patient will benefit from skilled SLP services to address above impairments and improve overall function.  REHAB POTENTIAL: Fair given above factors in italics  PLAN:  SLP FREQUENCY: 2x/week  SLP DURATION: 8 weeks with assessment of participation and progress after 4 weeks. If participation is less than necessary, or progress is minimal pt may be d/c'd with instructions about how to cont ST plan at home.  PLANNED  INTERVENTIONS: Environmental controls, Internal/external aids, Oral motor exercises, Functional tasks, Multimodal communication approach, SLP instruction and feedback, Compensatory strategies, and Patient/family education    [facility], CCC-SLP 10/13/2022, 8:39 PM

## 2022-10-14 ENCOUNTER — Encounter: Payer: Self-pay | Admitting: Cardiology

## 2022-10-16 ENCOUNTER — Encounter: Payer: Self-pay | Admitting: Cardiovascular Disease

## 2022-10-16 ENCOUNTER — Ambulatory Visit: Payer: Medicare Other

## 2022-10-16 ENCOUNTER — Encounter: Payer: Self-pay | Admitting: Hematology and Oncology

## 2022-10-17 ENCOUNTER — Encounter: Payer: Self-pay | Admitting: Surgery

## 2022-10-17 NOTE — Therapy (Signed)
[facility] [address] [place], [place], [address] Phone: [phone]   Fax:  [phone]  Patient Details  Name: Jose Beck MRN: [id number] Date of Birth: 29-May-1945 Referring Provider:  Michail Jewels, MD  Encounter Date: 10/17/2022  SPEECH THERAPY DISCHARGE SUMMARY  Visits from Start of Care: 3  Current functional level related to goals / functional outcomes: Wife called clinic on 10/16/22 and stated pt was now on hospice and would not need more ST appointments. Goals from last attended session are below. SHORT TERM GOALS: Target date: 10/31/22   Brett Canales will complete HEP at least 5/7 days/week over a one week period Baseline: Goal status: IN PROGRESS   2.   Pt will improve intelligibility to 95%+ in sentence responses, one repetition allowed, in two sessions Baseline:  Goal status: IN PROGRESS   3.  Pt will improve intelligibility to 80% at home, two repetitions allowed, as reported by wife Baseline:  Goal status: IN PROGRESS     LONG TERM GOALS: Target date: 12/03/22   Pt will improve his PROM score from initial administration during the last 1-2 ST sessions Baseline:  Goal status: IN PROGRESS   2.  Pt will improve intelligibility to 90% at home, two repetitions allowed, as reported by wife Baseline:  Goal status: IN PROGRESS   3.  Pt will demo intelligibility 100% to 10 everyday sentences in 4 sessions Baseline:  Goal status: IN PROGRESS   Remaining deficits: Assumed all deficits remain due to this being an unexpected d/c.   Education / Equipment: See therapy notes.   Patient agrees to discharge. Patient goals were not met. Patient is being discharged due to a change in medical status.Marland Kitchen    [place], CCC-SLP 10/17/2022, 8:30 AM  [facility] [address] [place], [place], [address] Phone: [phone]   Fax:   [phone]

## 2022-10-31 ENCOUNTER — Other Ambulatory Visit: Payer: Medicare Other

## 2022-10-31 ENCOUNTER — Ambulatory Visit: Payer: Medicare Other | Admitting: Hematology and Oncology

## 2022-11-07 ENCOUNTER — Institutional Professional Consult (permissible substitution): Payer: Medicare Other | Admitting: Cardiovascular Disease

## 2022-11-18 ENCOUNTER — Ambulatory Visit: Payer: Medicare Other | Admitting: Cardiology

## 2022-12-11 DEATH — deceased

## 2023-01-19 ENCOUNTER — Ambulatory Visit: Payer: Medicare Other | Admitting: Surgery
